# Patient Record
Sex: Female | Born: 1955
Health system: Southern US, Community
[De-identification: ages and names within clinical notes are randomized; demographics above are authoritative.]

## PROBLEM LIST (undated history)

## (undated) DIAGNOSIS — G709 Myoneural disorder, unspecified: Secondary | ICD-10-CM

## (undated) DIAGNOSIS — I10 Essential (primary) hypertension: Secondary | ICD-10-CM

## (undated) DIAGNOSIS — J449 Chronic obstructive pulmonary disease, unspecified: Secondary | ICD-10-CM

## (undated) DIAGNOSIS — K222 Esophageal obstruction: Secondary | ICD-10-CM

## (undated) DIAGNOSIS — F32A Depression, unspecified: Secondary | ICD-10-CM

## (undated) DIAGNOSIS — R011 Cardiac murmur, unspecified: Secondary | ICD-10-CM

## (undated) DIAGNOSIS — G8929 Other chronic pain: Secondary | ICD-10-CM

## (undated) DIAGNOSIS — K920 Hematemesis: Secondary | ICD-10-CM

## (undated) DIAGNOSIS — R112 Nausea with vomiting, unspecified: Secondary | ICD-10-CM

## (undated) DIAGNOSIS — N3010 Interstitial cystitis (chronic) without hematuria: Secondary | ICD-10-CM

## (undated) DIAGNOSIS — T7840XA Allergy, unspecified, initial encounter: Secondary | ICD-10-CM

## (undated) DIAGNOSIS — Z9889 Other specified postprocedural states: Secondary | ICD-10-CM

## (undated) DIAGNOSIS — I2510 Atherosclerotic heart disease of native coronary artery without angina pectoris: Secondary | ICD-10-CM

## (undated) DIAGNOSIS — M549 Dorsalgia, unspecified: Secondary | ICD-10-CM

## (undated) DIAGNOSIS — F329 Major depressive disorder, single episode, unspecified: Secondary | ICD-10-CM

## (undated) DIAGNOSIS — J45909 Unspecified asthma, uncomplicated: Secondary | ICD-10-CM

## (undated) DIAGNOSIS — F419 Anxiety disorder, unspecified: Secondary | ICD-10-CM

## (undated) DIAGNOSIS — K589 Irritable bowel syndrome without diarrhea: Secondary | ICD-10-CM

## (undated) DIAGNOSIS — E785 Hyperlipidemia, unspecified: Secondary | ICD-10-CM

## (undated) DIAGNOSIS — K219 Gastro-esophageal reflux disease without esophagitis: Secondary | ICD-10-CM

## (undated) DIAGNOSIS — M1990 Unspecified osteoarthritis, unspecified site: Secondary | ICD-10-CM

## (undated) HISTORY — DX: Anxiety disorder, unspecified: F41.9

## (undated) HISTORY — DX: Myoneural disorder, unspecified: G70.9

## (undated) HISTORY — DX: Dorsalgia, unspecified: M54.9

## (undated) HISTORY — PX: ABDOMINAL HYSTERECTOMY: SHX81

## (undated) HISTORY — DX: Interstitial cystitis (chronic) without hematuria: N30.10

## (undated) HISTORY — DX: Major depressive disorder, single episode, unspecified: F32.9

## (undated) HISTORY — DX: Depression, unspecified: F32.A

## (undated) HISTORY — DX: Allergy, unspecified, initial encounter: T78.40XA

## (undated) HISTORY — PX: TONSILLECTOMY: SUR1361

## (undated) HISTORY — DX: Esophageal obstruction: K22.2

## (undated) HISTORY — PX: APPENDECTOMY: SHX54

## (undated) HISTORY — DX: Unspecified osteoarthritis, unspecified site: M19.90

## (undated) HISTORY — DX: Hyperlipidemia, unspecified: E78.5

## (undated) HISTORY — DX: Gastro-esophageal reflux disease without esophagitis: K21.9

## (undated) HISTORY — DX: Unspecified asthma, uncomplicated: J45.909

## (undated) HISTORY — DX: Irritable bowel syndrome, unspecified: K58.9

## (undated) HISTORY — DX: Other chronic pain: G89.29

## (undated) HISTORY — DX: Essential (primary) hypertension: I10

## (undated) HISTORY — PX: ESOPHAGOGASTRODUODENOSCOPY: SHX1529

## (undated) HISTORY — PX: CERVICAL FUSION: SHX112

## (undated) HISTORY — DX: Cardiac murmur, unspecified: R01.1

## (undated) HISTORY — PX: FINGER SURGERY: SHX640

## (undated) HISTORY — PX: CERVICAL LAMINECTOMY: SHX94

## (undated) HISTORY — PX: LUMBAR FUSION: SHX111

---
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
## 1997-05-09 ENCOUNTER — Other Ambulatory Visit: Admission: RE | Admit: 1997-05-09 | Discharge: 1997-05-09 | Payer: Self-pay | Admitting: Gynecology

## 1997-09-05 ENCOUNTER — Ambulatory Visit (HOSPITAL_COMMUNITY): Admission: RE | Admit: 1997-09-05 | Discharge: 1997-09-05 | Payer: Self-pay | Admitting: Internal Medicine

## 1998-04-17 ENCOUNTER — Ambulatory Visit (HOSPITAL_COMMUNITY): Admission: RE | Admit: 1998-04-17 | Discharge: 1998-04-17 | Payer: Self-pay | Admitting: Internal Medicine

## 1998-04-17 ENCOUNTER — Encounter: Payer: Self-pay | Admitting: Internal Medicine

## 1998-07-17 ENCOUNTER — Other Ambulatory Visit: Admission: RE | Admit: 1998-07-17 | Discharge: 1998-07-17 | Payer: Self-pay | Admitting: Gynecology

## 1999-05-28 ENCOUNTER — Encounter: Payer: Self-pay | Admitting: Emergency Medicine

## 1999-05-28 ENCOUNTER — Emergency Department (HOSPITAL_COMMUNITY): Admission: EM | Admit: 1999-05-28 | Discharge: 1999-05-28 | Payer: Self-pay | Admitting: Emergency Medicine

## 1999-09-09 ENCOUNTER — Emergency Department (HOSPITAL_COMMUNITY): Admission: EM | Admit: 1999-09-09 | Discharge: 1999-09-09 | Payer: Self-pay | Admitting: Emergency Medicine

## 2000-02-25 ENCOUNTER — Encounter: Payer: Self-pay | Admitting: Internal Medicine

## 2000-02-25 ENCOUNTER — Ambulatory Visit (HOSPITAL_COMMUNITY): Admission: RE | Admit: 2000-02-25 | Discharge: 2000-02-25 | Payer: Self-pay | Admitting: Internal Medicine

## 2001-01-12 ENCOUNTER — Other Ambulatory Visit: Admission: RE | Admit: 2001-01-12 | Discharge: 2001-01-12 | Payer: Self-pay | Admitting: Gynecology

## 2001-02-23 ENCOUNTER — Encounter: Payer: Self-pay | Admitting: Internal Medicine

## 2001-02-23 ENCOUNTER — Inpatient Hospital Stay (HOSPITAL_COMMUNITY): Admission: EM | Admit: 2001-02-23 | Discharge: 2001-02-26 | Payer: Self-pay | Admitting: Emergency Medicine

## 2001-03-16 DIAGNOSIS — K222 Esophageal obstruction: Secondary | ICD-10-CM

## 2001-06-15 ENCOUNTER — Ambulatory Visit (HOSPITAL_BASED_OUTPATIENT_CLINIC_OR_DEPARTMENT_OTHER): Admission: RE | Admit: 2001-06-15 | Discharge: 2001-06-15 | Payer: Self-pay | Admitting: *Deleted

## 2002-01-26 ENCOUNTER — Encounter: Payer: Self-pay | Admitting: Internal Medicine

## 2002-01-26 ENCOUNTER — Ambulatory Visit (HOSPITAL_COMMUNITY): Admission: RE | Admit: 2002-01-26 | Discharge: 2002-01-26 | Payer: Self-pay | Admitting: Internal Medicine

## 2004-02-24 ENCOUNTER — Ambulatory Visit (HOSPITAL_BASED_OUTPATIENT_CLINIC_OR_DEPARTMENT_OTHER): Admission: RE | Admit: 2004-02-24 | Discharge: 2004-02-24 | Payer: Self-pay | Admitting: Urology

## 2004-02-24 ENCOUNTER — Encounter (INDEPENDENT_AMBULATORY_CARE_PROVIDER_SITE_OTHER): Payer: Self-pay | Admitting: Specialist

## 2004-02-24 ENCOUNTER — Ambulatory Visit (HOSPITAL_COMMUNITY): Admission: RE | Admit: 2004-02-24 | Discharge: 2004-02-24 | Payer: Self-pay | Admitting: Urology

## 2004-12-24 ENCOUNTER — Ambulatory Visit: Payer: Self-pay | Admitting: Internal Medicine

## 2005-03-04 ENCOUNTER — Ambulatory Visit: Payer: Self-pay | Admitting: Internal Medicine

## 2005-04-05 ENCOUNTER — Encounter: Admission: RE | Admit: 2005-04-05 | Discharge: 2005-04-05 | Payer: Self-pay | Admitting: Internal Medicine

## 2005-04-09 ENCOUNTER — Ambulatory Visit: Payer: Self-pay | Admitting: Internal Medicine

## 2006-01-21 ENCOUNTER — Ambulatory Visit: Payer: Self-pay | Admitting: Internal Medicine

## 2006-03-20 ENCOUNTER — Ambulatory Visit: Payer: Self-pay | Admitting: Internal Medicine

## 2006-04-14 ENCOUNTER — Emergency Department (HOSPITAL_COMMUNITY): Admission: EM | Admit: 2006-04-14 | Discharge: 2006-04-14 | Payer: Self-pay | Admitting: Emergency Medicine

## 2006-04-21 ENCOUNTER — Ambulatory Visit: Payer: Self-pay

## 2006-05-15 ENCOUNTER — Ambulatory Visit: Payer: Self-pay | Admitting: Internal Medicine

## 2006-06-16 ENCOUNTER — Ambulatory Visit: Payer: Self-pay | Admitting: Internal Medicine

## 2006-08-12 ENCOUNTER — Telehealth: Payer: Self-pay | Admitting: Internal Medicine

## 2006-08-25 ENCOUNTER — Ambulatory Visit: Payer: Self-pay | Admitting: Internal Medicine

## 2006-08-26 ENCOUNTER — Encounter: Payer: Self-pay | Admitting: Internal Medicine

## 2006-08-26 LAB — CONVERTED CEMR LAB
BUN: 12 mg/dL (ref 6–23)
CRP, High Sensitivity: <1 — ABNORMAL LOW (ref 0.00–5.00)
Chloride: 107 meq/L (ref 96–112)
Creatinine, Ser: 0.7 mg/dL (ref 0.4–1.2)
GFR calc Af Amer: 113 mL/min
Glucose, Bld: 98 mg/dL (ref 70–99)
Potassium: 4.5 meq/L (ref 3.5–5.1)
Sodium: 147 meq/L — ABNORMAL HIGH (ref 135–145)

## 2006-09-02 ENCOUNTER — Telehealth (INDEPENDENT_AMBULATORY_CARE_PROVIDER_SITE_OTHER): Payer: Self-pay | Admitting: *Deleted

## 2006-09-09 DIAGNOSIS — K589 Irritable bowel syndrome without diarrhea: Secondary | ICD-10-CM | POA: Insufficient documentation

## 2006-09-15 ENCOUNTER — Ambulatory Visit: Payer: Self-pay | Admitting: Internal Medicine

## 2006-09-15 DIAGNOSIS — IMO0002 Reserved for concepts with insufficient information to code with codable children: Secondary | ICD-10-CM

## 2006-09-15 DIAGNOSIS — F339 Major depressive disorder, recurrent, unspecified: Secondary | ICD-10-CM

## 2006-09-18 ENCOUNTER — Telehealth (INDEPENDENT_AMBULATORY_CARE_PROVIDER_SITE_OTHER): Payer: Self-pay | Admitting: *Deleted

## 2006-09-30 ENCOUNTER — Telehealth: Payer: Self-pay | Admitting: Internal Medicine

## 2006-10-16 ENCOUNTER — Telehealth: Payer: Self-pay | Admitting: *Deleted

## 2006-10-21 ENCOUNTER — Telehealth: Payer: Self-pay | Admitting: Internal Medicine

## 2006-10-27 ENCOUNTER — Ambulatory Visit: Payer: Self-pay | Admitting: Internal Medicine

## 2006-10-27 DIAGNOSIS — N951 Menopausal and female climacteric states: Secondary | ICD-10-CM

## 2006-11-06 ENCOUNTER — Telehealth: Payer: Self-pay | Admitting: *Deleted

## 2006-11-07 ENCOUNTER — Telehealth (INDEPENDENT_AMBULATORY_CARE_PROVIDER_SITE_OTHER): Payer: Self-pay | Admitting: *Deleted

## 2006-11-07 DIAGNOSIS — M1710 Unilateral primary osteoarthritis, unspecified knee: Secondary | ICD-10-CM | POA: Insufficient documentation

## 2006-11-21 ENCOUNTER — Telehealth (INDEPENDENT_AMBULATORY_CARE_PROVIDER_SITE_OTHER): Payer: Self-pay | Admitting: *Deleted

## 2006-12-02 ENCOUNTER — Telehealth: Payer: Self-pay | Admitting: Internal Medicine

## 2006-12-09 ENCOUNTER — Ambulatory Visit: Payer: Self-pay | Admitting: Internal Medicine

## 2006-12-29 ENCOUNTER — Telehealth: Payer: Self-pay | Admitting: *Deleted

## 2007-02-02 ENCOUNTER — Telehealth: Payer: Self-pay | Admitting: Internal Medicine

## 2007-02-26 ENCOUNTER — Ambulatory Visit: Payer: Self-pay | Admitting: Internal Medicine

## 2007-02-26 DIAGNOSIS — J209 Acute bronchitis, unspecified: Secondary | ICD-10-CM

## 2007-03-04 ENCOUNTER — Telehealth: Payer: Self-pay | Admitting: Internal Medicine

## 2007-03-06 ENCOUNTER — Telehealth: Payer: Self-pay | Admitting: Internal Medicine

## 2007-03-13 ENCOUNTER — Telehealth: Payer: Self-pay | Admitting: Internal Medicine

## 2007-03-23 ENCOUNTER — Ambulatory Visit: Payer: Self-pay | Admitting: Internal Medicine

## 2007-03-23 DIAGNOSIS — F17200 Nicotine dependence, unspecified, uncomplicated: Secondary | ICD-10-CM

## 2007-03-30 ENCOUNTER — Telehealth: Payer: Self-pay | Admitting: Internal Medicine

## 2007-04-01 ENCOUNTER — Encounter: Payer: Self-pay | Admitting: Internal Medicine

## 2007-04-09 ENCOUNTER — Telehealth: Payer: Self-pay | Admitting: *Deleted

## 2007-04-14 ENCOUNTER — Emergency Department (HOSPITAL_COMMUNITY): Admission: EM | Admit: 2007-04-14 | Discharge: 2007-04-14 | Payer: Self-pay | Admitting: Family Medicine

## 2007-04-14 ENCOUNTER — Telehealth: Payer: Self-pay | Admitting: Internal Medicine

## 2007-04-20 ENCOUNTER — Ambulatory Visit: Payer: Self-pay | Admitting: Internal Medicine

## 2007-05-06 ENCOUNTER — Encounter: Admission: RE | Admit: 2007-05-06 | Discharge: 2007-05-06 | Payer: Self-pay | Admitting: Internal Medicine

## 2007-05-06 ENCOUNTER — Telehealth: Payer: Self-pay | Admitting: Internal Medicine

## 2007-05-08 ENCOUNTER — Telehealth (INDEPENDENT_AMBULATORY_CARE_PROVIDER_SITE_OTHER): Payer: Self-pay | Admitting: *Deleted

## 2007-05-11 ENCOUNTER — Encounter: Payer: Self-pay | Admitting: Internal Medicine

## 2007-06-05 ENCOUNTER — Encounter: Admission: RE | Admit: 2007-06-05 | Discharge: 2007-06-05 | Payer: Self-pay | Admitting: Sports Medicine

## 2007-06-15 ENCOUNTER — Telehealth: Payer: Self-pay | Admitting: *Deleted

## 2007-06-16 ENCOUNTER — Telehealth (INDEPENDENT_AMBULATORY_CARE_PROVIDER_SITE_OTHER): Payer: Self-pay | Admitting: *Deleted

## 2007-06-23 ENCOUNTER — Encounter: Payer: Self-pay | Admitting: Internal Medicine

## 2007-07-01 ENCOUNTER — Telehealth: Payer: Self-pay | Admitting: Internal Medicine

## 2007-07-02 ENCOUNTER — Ambulatory Visit (HOSPITAL_COMMUNITY): Admission: RE | Admit: 2007-07-02 | Discharge: 2007-07-03 | Payer: Self-pay | Admitting: Neurological Surgery

## 2007-07-17 ENCOUNTER — Encounter: Payer: Self-pay | Admitting: Internal Medicine

## 2007-07-24 ENCOUNTER — Ambulatory Visit (HOSPITAL_BASED_OUTPATIENT_CLINIC_OR_DEPARTMENT_OTHER): Admission: RE | Admit: 2007-07-24 | Discharge: 2007-07-24 | Payer: Self-pay | Admitting: Orthopedic Surgery

## 2007-07-31 ENCOUNTER — Encounter: Payer: Self-pay | Admitting: Internal Medicine

## 2007-08-07 ENCOUNTER — Ambulatory Visit: Payer: Self-pay | Admitting: Internal Medicine

## 2007-08-07 LAB — CONVERTED CEMR LAB: Blood Glucose, Fingerstick: 108

## 2007-08-19 ENCOUNTER — Encounter: Payer: Self-pay | Admitting: Internal Medicine

## 2007-08-20 ENCOUNTER — Encounter: Payer: Self-pay | Admitting: Internal Medicine

## 2007-08-31 ENCOUNTER — Encounter: Payer: Self-pay | Admitting: Internal Medicine

## 2007-08-31 ENCOUNTER — Telehealth: Payer: Self-pay | Admitting: Internal Medicine

## 2007-09-04 ENCOUNTER — Encounter: Payer: Self-pay | Admitting: Internal Medicine

## 2007-09-08 ENCOUNTER — Ambulatory Visit: Payer: Self-pay | Admitting: Internal Medicine

## 2007-09-08 DIAGNOSIS — M5020 Other cervical disc displacement, unspecified cervical region: Secondary | ICD-10-CM

## 2007-09-08 DIAGNOSIS — M545 Low back pain: Secondary | ICD-10-CM

## 2007-09-08 DIAGNOSIS — M7500 Adhesive capsulitis of unspecified shoulder: Secondary | ICD-10-CM | POA: Insufficient documentation

## 2007-09-14 ENCOUNTER — Ambulatory Visit: Payer: Self-pay | Admitting: Internal Medicine

## 2007-09-30 ENCOUNTER — Ambulatory Visit: Payer: Self-pay | Admitting: Internal Medicine

## 2007-10-09 ENCOUNTER — Encounter: Payer: Self-pay | Admitting: Internal Medicine

## 2007-10-13 ENCOUNTER — Ambulatory Visit: Payer: Self-pay | Admitting: Internal Medicine

## 2007-10-29 ENCOUNTER — Telehealth: Payer: Self-pay | Admitting: Internal Medicine

## 2007-11-19 ENCOUNTER — Telehealth: Payer: Self-pay | Admitting: Internal Medicine

## 2007-12-14 ENCOUNTER — Encounter: Payer: Self-pay | Admitting: Internal Medicine

## 2008-01-11 ENCOUNTER — Encounter: Payer: Self-pay | Admitting: Internal Medicine

## 2008-01-18 ENCOUNTER — Telehealth (INDEPENDENT_AMBULATORY_CARE_PROVIDER_SITE_OTHER): Payer: Self-pay | Admitting: *Deleted

## 2008-01-27 ENCOUNTER — Telehealth: Payer: Self-pay | Admitting: Internal Medicine

## 2008-02-03 ENCOUNTER — Ambulatory Visit: Payer: Self-pay | Admitting: Internal Medicine

## 2008-02-22 ENCOUNTER — Encounter: Payer: Self-pay | Admitting: Internal Medicine

## 2008-02-24 ENCOUNTER — Telehealth: Payer: Self-pay | Admitting: Internal Medicine

## 2008-03-24 ENCOUNTER — Telehealth: Payer: Self-pay | Admitting: *Deleted

## 2008-03-25 ENCOUNTER — Ambulatory Visit: Payer: Self-pay | Admitting: Internal Medicine

## 2008-03-25 DIAGNOSIS — M79609 Pain in unspecified limb: Secondary | ICD-10-CM | POA: Insufficient documentation

## 2008-03-29 ENCOUNTER — Telehealth: Payer: Self-pay | Admitting: Internal Medicine

## 2008-05-05 ENCOUNTER — Ambulatory Visit: Payer: Self-pay | Admitting: Internal Medicine

## 2008-06-06 ENCOUNTER — Ambulatory Visit: Payer: Self-pay | Admitting: Internal Medicine

## 2008-06-27 ENCOUNTER — Telehealth (INDEPENDENT_AMBULATORY_CARE_PROVIDER_SITE_OTHER): Payer: Self-pay | Admitting: *Deleted

## 2008-07-04 ENCOUNTER — Ambulatory Visit: Payer: Self-pay | Admitting: Internal Medicine

## 2008-07-04 DIAGNOSIS — M5412 Radiculopathy, cervical region: Secondary | ICD-10-CM | POA: Insufficient documentation

## 2008-08-25 ENCOUNTER — Telehealth: Payer: Self-pay | Admitting: Internal Medicine

## 2008-09-13 ENCOUNTER — Telehealth: Payer: Self-pay | Admitting: Internal Medicine

## 2008-11-15 ENCOUNTER — Ambulatory Visit: Payer: Self-pay | Admitting: Internal Medicine

## 2008-12-27 ENCOUNTER — Telehealth: Payer: Self-pay | Admitting: Internal Medicine

## 2009-01-03 ENCOUNTER — Telehealth (INDEPENDENT_AMBULATORY_CARE_PROVIDER_SITE_OTHER): Payer: Self-pay | Admitting: *Deleted

## 2009-01-27 ENCOUNTER — Telehealth: Payer: Self-pay | Admitting: Internal Medicine

## 2009-02-15 ENCOUNTER — Ambulatory Visit: Payer: Self-pay | Admitting: Internal Medicine

## 2009-02-27 ENCOUNTER — Telehealth: Payer: Self-pay | Admitting: Internal Medicine

## 2009-04-07 ENCOUNTER — Telehealth: Payer: Self-pay | Admitting: Internal Medicine

## 2009-05-09 ENCOUNTER — Telehealth: Payer: Self-pay | Admitting: Internal Medicine

## 2009-05-29 ENCOUNTER — Telehealth: Payer: Self-pay | Admitting: Internal Medicine

## 2009-06-01 ENCOUNTER — Telehealth: Payer: Self-pay | Admitting: Internal Medicine

## 2009-07-06 ENCOUNTER — Telehealth: Payer: Self-pay | Admitting: Internal Medicine

## 2009-07-12 ENCOUNTER — Telehealth: Payer: Self-pay | Admitting: Internal Medicine

## 2009-07-24 ENCOUNTER — Telehealth: Payer: Self-pay | Admitting: Internal Medicine

## 2009-07-31 ENCOUNTER — Telehealth: Payer: Self-pay | Admitting: *Deleted

## 2009-09-07 ENCOUNTER — Telehealth: Payer: Self-pay | Admitting: Internal Medicine

## 2009-09-29 ENCOUNTER — Telehealth: Payer: Self-pay | Admitting: Internal Medicine

## 2009-10-06 ENCOUNTER — Telehealth: Payer: Self-pay | Admitting: Internal Medicine

## 2009-10-12 ENCOUNTER — Ambulatory Visit: Payer: Self-pay | Admitting: Internal Medicine

## 2009-10-12 DIAGNOSIS — M722 Plantar fascial fibromatosis: Secondary | ICD-10-CM

## 2009-10-12 LAB — CONVERTED CEMR LAB: Cyclic Citrullin Peptide Ab: <2 units (ref 0.0–5.0)

## 2009-10-13 ENCOUNTER — Telehealth: Payer: Self-pay | Admitting: Internal Medicine

## 2009-10-25 ENCOUNTER — Ambulatory Visit: Payer: Self-pay | Admitting: Internal Medicine

## 2009-10-25 DIAGNOSIS — R9431 Abnormal electrocardiogram [ECG] [EKG]: Secondary | ICD-10-CM

## 2009-10-25 LAB — CONVERTED CEMR LAB
ALT: 40 units/L — ABNORMAL HIGH (ref 0–35)
Alkaline Phosphatase: 69 units/L (ref 39–117)
BUN: 20 mg/dL (ref 6–23)
Calcium: 9.3 mg/dL (ref 8.4–10.5)
Cholesterol: 233 mg/dL — ABNORMAL HIGH (ref 0–200)
Eosinophils Absolute: 0.1 10*3/uL (ref 0.0–0.7)
HCT: 39.5 % (ref 36.0–46.0)
HDL: 57 mg/dL (ref >39.00)
Hemoglobin: 13.6 g/dL (ref 12.0–15.0)
MCV: 89.7 fL (ref 78.0–100.0)
Monocytes Absolute: 0.7 10*3/uL (ref 0.1–1.0)
Monocytes Relative: 4.4 % (ref 3.0–12.0)
Neutro Abs: 10 10*3/uL — ABNORMAL HIGH (ref 1.4–7.7)
Neutrophils Relative %: 62.9 % (ref 43.0–77.0)
Sodium: 141 meq/L (ref 135–145)
Total Bilirubin: 0.5 mg/dL (ref 0.3–1.2)
Triglycerides: 147 mg/dL (ref 0.0–149.0)

## 2009-10-26 ENCOUNTER — Telehealth: Payer: Self-pay | Admitting: Internal Medicine

## 2009-10-26 ENCOUNTER — Emergency Department (HOSPITAL_COMMUNITY): Admission: EM | Admit: 2009-10-26 | Discharge: 2009-10-26 | Payer: Self-pay | Admitting: Emergency Medicine

## 2009-10-30 ENCOUNTER — Encounter: Payer: Self-pay | Admitting: Internal Medicine

## 2009-10-30 ENCOUNTER — Encounter (HOSPITAL_COMMUNITY): Admission: RE | Admit: 2009-10-30 | Discharge: 2010-01-11 | Payer: Self-pay | Admitting: Internal Medicine

## 2009-10-30 ENCOUNTER — Ambulatory Visit: Payer: Self-pay

## 2009-10-30 ENCOUNTER — Ambulatory Visit: Payer: Self-pay | Admitting: Internal Medicine

## 2009-10-31 ENCOUNTER — Ambulatory Visit: Payer: Self-pay | Admitting: Internal Medicine

## 2009-10-31 ENCOUNTER — Telehealth: Payer: Self-pay | Admitting: Internal Medicine

## 2009-10-31 DIAGNOSIS — I10 Essential (primary) hypertension: Secondary | ICD-10-CM | POA: Insufficient documentation

## 2009-10-31 LAB — CONVERTED CEMR LAB
Basophils Absolute: 0 10*3/uL (ref 0.0–0.1)
Basophils Relative: 0.3 % (ref 0.0–3.0)
Eosinophils Absolute: 0.3 10*3/uL (ref 0.0–0.7)
Eosinophils Relative: 2.5 % (ref 0.0–5.0)
Hemoglobin: 13.6 g/dL (ref 12.0–15.0)
Lymphocytes Relative: 20.9 % (ref 12.0–46.0)
Lymphs Abs: 3 10*3/uL (ref 0.7–4.0)
MCV: 89.9 fL (ref 78.0–100.0)
Monocytes Absolute: 0.7 10*3/uL (ref 0.1–1.0)
Neutro Abs: 10.1 10*3/uL — ABNORMAL HIGH (ref 1.4–7.7)
Platelets: 297 10*3/uL (ref 150.0–400.0)
RBC: 4.37 M/uL (ref 3.87–5.11)
RDW: 13.6 % (ref 11.5–14.6)

## 2009-11-01 ENCOUNTER — Telehealth: Payer: Self-pay | Admitting: Internal Medicine

## 2009-11-02 ENCOUNTER — Ambulatory Visit: Payer: Self-pay | Admitting: Internal Medicine

## 2009-11-02 ENCOUNTER — Telehealth: Payer: Self-pay | Admitting: Internal Medicine

## 2009-11-02 ENCOUNTER — Other Ambulatory Visit: Admission: RE | Admit: 2009-11-02 | Discharge: 2009-11-02 | Payer: Self-pay | Admitting: Internal Medicine

## 2009-11-03 ENCOUNTER — Telehealth: Payer: Self-pay | Admitting: Internal Medicine

## 2009-11-10 ENCOUNTER — Telehealth: Payer: Self-pay | Admitting: Internal Medicine

## 2009-11-15 ENCOUNTER — Telehealth: Payer: Self-pay | Admitting: Internal Medicine

## 2009-11-21 ENCOUNTER — Ambulatory Visit: Payer: Self-pay | Admitting: Cardiovascular Disease

## 2009-11-21 DIAGNOSIS — R002 Palpitations: Secondary | ICD-10-CM | POA: Insufficient documentation

## 2009-11-21 DIAGNOSIS — R9439 Abnormal result of other cardiovascular function study: Secondary | ICD-10-CM

## 2009-11-21 DIAGNOSIS — R072 Precordial pain: Secondary | ICD-10-CM | POA: Insufficient documentation

## 2009-11-21 LAB — CONVERTED CEMR LAB
BUN: 16 mg/dL (ref 6–23)
CO2: 28 meq/L (ref 19–32)
Calcium: 9.3 mg/dL (ref 8.4–10.5)
Chloride: 102 meq/L (ref 96–112)
Eosinophils Relative: 2.3 % (ref 0.0–5.0)
INR: 1 (ref 0.8–1.0)
Lymphocytes Relative: 20.8 % (ref 12.0–46.0)
MCHC: 34.1 g/dL (ref 30.0–36.0)
Platelets: 267 10*3/uL (ref 150.0–400.0)
Sodium: 138 meq/L (ref 135–145)
WBC: 10.4 10*3/uL (ref 4.5–10.5)

## 2009-11-23 ENCOUNTER — Telehealth (INDEPENDENT_AMBULATORY_CARE_PROVIDER_SITE_OTHER): Payer: Self-pay | Admitting: *Deleted

## 2009-11-29 ENCOUNTER — Telehealth: Payer: Self-pay | Admitting: Internal Medicine

## 2009-11-30 ENCOUNTER — Ambulatory Visit: Payer: Self-pay | Admitting: Cardiovascular Disease

## 2009-11-30 ENCOUNTER — Inpatient Hospital Stay (HOSPITAL_BASED_OUTPATIENT_CLINIC_OR_DEPARTMENT_OTHER): Admission: RE | Admit: 2009-11-30 | Discharge: 2009-11-30 | Payer: Self-pay | Admitting: Cardiovascular Disease

## 2009-12-04 ENCOUNTER — Ambulatory Visit (HOSPITAL_COMMUNITY): Admission: RE | Admit: 2009-12-04 | Discharge: 2009-12-04 | Payer: Self-pay | Admitting: Cardiovascular Disease

## 2009-12-04 ENCOUNTER — Ambulatory Visit: Payer: Self-pay | Admitting: Cardiovascular Disease

## 2009-12-04 ENCOUNTER — Encounter: Payer: Self-pay | Admitting: Cardiovascular Disease

## 2009-12-04 ENCOUNTER — Ambulatory Visit: Payer: Self-pay

## 2009-12-04 ENCOUNTER — Ambulatory Visit: Payer: Self-pay | Admitting: Cardiology

## 2009-12-12 ENCOUNTER — Encounter: Payer: Self-pay | Admitting: Cardiovascular Disease

## 2009-12-12 ENCOUNTER — Telehealth: Payer: Self-pay | Admitting: Cardiovascular Disease

## 2009-12-19 ENCOUNTER — Ambulatory Visit: Payer: Self-pay | Admitting: Cardiovascular Disease

## 2009-12-19 DIAGNOSIS — I2510 Atherosclerotic heart disease of native coronary artery without angina pectoris: Secondary | ICD-10-CM

## 2010-01-01 ENCOUNTER — Telehealth: Payer: Self-pay | Admitting: Internal Medicine

## 2010-01-01 ENCOUNTER — Ambulatory Visit: Payer: Self-pay | Admitting: Family Medicine

## 2010-01-01 DIAGNOSIS — N3010 Interstitial cystitis (chronic) without hematuria: Secondary | ICD-10-CM

## 2010-01-01 DIAGNOSIS — R221 Localized swelling, mass and lump, neck: Secondary | ICD-10-CM

## 2010-01-01 DIAGNOSIS — R220 Localized swelling, mass and lump, head: Secondary | ICD-10-CM

## 2010-01-01 LAB — CONVERTED CEMR LAB
Blood in Urine, dipstick: NEGATIVE
Nitrite: NEGATIVE
Protein, U semiquant: NEGATIVE
Specific Gravity, Urine: 1.025
Urobilinogen, UA: 0.2
WBC Urine, dipstick: NEGATIVE
pH: 5

## 2010-01-19 ENCOUNTER — Ambulatory Visit: Payer: Self-pay | Admitting: Internal Medicine

## 2010-02-07 ENCOUNTER — Telehealth: Payer: Self-pay | Admitting: Internal Medicine

## 2010-02-27 ENCOUNTER — Telehealth: Payer: Self-pay | Admitting: Internal Medicine

## 2010-03-04 ENCOUNTER — Encounter: Payer: Self-pay | Admitting: Internal Medicine

## 2010-03-13 ENCOUNTER — Telehealth: Payer: Self-pay | Admitting: Internal Medicine

## 2010-03-15 ENCOUNTER — Telehealth: Payer: Self-pay | Admitting: Internal Medicine

## 2010-03-15 DIAGNOSIS — M549 Dorsalgia, unspecified: Secondary | ICD-10-CM

## 2010-03-15 NOTE — Assessment & Plan Note (Signed)
Summary: 3-4 WK/[name]  Medications Added XANAX 0.5 MG  TABS (ALPRAZOLAM) Take one tablet  by mouth as needed ASPIRIN 81 MG TBEC (ASPIRIN) Take one tablet by mouth daily. pt HAS NOT [name] METOPROLOL TARTRATE 25 MG TABS (METOPROLOL TARTRATE) Take one tablet by mouth twice a day .Laurie [name] has not [name] PRAVASTATIN SODIUM 40 MG TABS (PRAVASTATIN SODIUM) Take one tablet by mouth daily at bedtime .[place] [name] has not [name]        Visit Type:  rov Primary Provider:  Stacie Glaze MD  CC:  denies any cardiac complaints today...pt c/o fatigue.  History of Present Illness: 55 yo WF with h/o tobacco abuse, borderline hyperlipidemia and family history of CAD who is here today for follow up. She was seen as a new pt one month ago for evaluation of chest pain. She was recently seen by Dr. Lovell Sheehan and was sent for a stress myoview. Her perfusion images were normal with normal LVEF but she did have ST segment depression in recovery and a hypertensive response to exercise.  She was sent to the ED on September 15th, 2011 with episode of chest pain that she felt was related to something stuck in her esophagus. This pain was substernal squeezing. EKG and labs ok. She was not admitted. This pain resolved after 6-8 hours. No chest pain since then. She has a history of rheumatic fever as a child. She had noticed almost daily episodes of palpitations with rapid heart rate. She felt dizzy and weak. This lasted for ten minutes. No syncope. She notices frequent "heavy" heart beats associated with dizziness.   I ordered a cath  and a 48 hour Holter monitor.  Her Holter showed PACs, PVCs. Her cath is outlined below, showed mild to moderate CAD. Echo with normal LV size and function. She has had one other episode of chest pain. She has had prior esophageal dilatation. She thinks the pain is similar to prior episodes related to her esophagus. No near syncope or syncope.    Current Medications  (verified): 1)  Xanax 0.5 Mg  Tabs (Alprazolam) .... Take One Tablet  By Mouth As Needed 2)  Valium 5 Mg  Tabs (Diazepam) .... One By Mouth Three Times A Day As Needed 3)  Hydrocodone-Acetaminophen 7.5-500 Mg Tabs (Hydrocodone-Acetaminophen) .Marland Kitchen.. 1 Two Times A Day As Needed- 60 Must Last 30 Days 4)  Pristiq 50 Mg Xr24h-Tab (Desvenlafaxine Succinate) .Marland Kitchen.. 1 Tab Once Daily 5)  Aspirin 81 Mg Tbec (Aspirin) .... Take One Tablet By Mouth Daily. Pt Has Not [name] 6)  Metoprolol Tartrate 25 Mg Tabs (Metoprolol Tartrate) .... Take One Tablet By Mouth Twice A Day .Laurie [name] Has Not [name] 7)  Pravastatin Sodium 40 Mg Tabs (Pravastatin Sodium) .... Take One Tablet By Mouth Daily At Bedtime .Laurie [name] Has Not [name]  Allergies: 1)  ! Sulfa 2)  ! Codeine  Past History:  Past Medical History: Reviewed history from 11/21/2009 and no changes required. IBS (ICD-564.1) STRICTURE, ESOPHAGEAL (ICD-530.3) s/p dilatation x 3. chronic back pain Depression Low back pain NECK PAIN  Social History: Reviewed history from 11/21/2009 and no changes required. Single 1 child Smoked 1/2 ppd for the last 10 years.  Alcohol use-yes, occasional No illicit drug use Works as a Interior and spatial designer at [employer].   Review of Systems  The patient denies fatigue, malaise, fever, weight gain/loss, vision loss, decreased hearing, hoarseness, chest pain, palpitations, shortness of breath, prolonged cough, wheezing, sleep apnea, coughing up blood, abdominal pain, blood in stool,  nausea, vomiting, diarrhea, heartburn, incontinence, blood in urine, muscle weakness, joint pain, leg swelling, rash, skin lesions, headache, fainting, dizziness, depression, anxiety, enlarged lymph nodes, easy bruising or bleeding, and environmental allergies.    Vital Signs:  Patient profile:   55 year old female Height:      71 inches Weight:      172.8 pounds BMI:     24.19 Pulse rate:   76 / minute Pulse rhythm:   irregular BP  sitting:   130 / 76  (left arm) Cuff size:   large  Vitals Entered By: Laurie [name], CMA (December 19, 2009 3:13 PM)  Physical Exam  General:  General: Well developed, well nourished, NAD HEENT: OP clear, mucus membranes moist Psychiatric: Mood and affect normal Neck: No JVD, no carotid bruits, no thyromegaly, no lymphadenopathy. Lungs:Clear bilaterally, no wheezes, rhonci, crackles CV: RRR no murmurs, gallops rubs Abdomen: soft, NT, ND, BS present Extremities: No edema, pulses 2+.    Cardiac Cath  Procedure date:  11/30/2009  Findings:      1. Left main coronary artery had no obstructive disease. 2. The left anterior descending was a large vessel that coursed to the     apex and gave off several diagonal branches.  There was a 20% mild     stenosis in the mid LAD. 3. Circumflex artery was composed of a moderate-sized proximal AV     groove vessel and then it gave off a moderate-sized obtuse marginal     branch.  The AV groove circumflex beyond the marginal branch was     very small in caliber.  At the very beginning portion of the first     obtuse marginal branch, there appeared to be a 40% stenosis.  This     did not appear to be flow-limiting. 4. The right coronary artery was a large dominant vessel that had     discrete 30% stenosis in the mid vessel. 5. Left ventricular angiogram was performed in the RAO projection that     showed normal left ventricular systolic function with ejection     fraction of 55%.  Echocardiogram  Procedure date:  12/04/2009  Findings:      Left ventricle: The cavity size was normal. There was mild focal       basal hypertrophy of the septum. Systolic function was normal. The       estimated ejection fraction was in the range of 60% to 65%. Wall       motion was normal; there were no regional wall motion       abnormalities. Features are consistent with a pseudonormal left       ventricular filling pattern, with concomitant abnormal  relaxation       and increased filling pressure (grade 2 diastolic dysfunction).     - Aortic valve: Trivial regurgitation.  Holter Monitor  Procedure date:  12/04/2009  Findings:      Sinus rhythm, PACs, PVCs. Episode of sinus bradycardia, (slowest 45 bpm x 24 beats).   Impression & Recommendations:  Problem # 1:  CAD, NATIVE VESSEL (ICD-414.01) Mild disease. I have encouraged her to pick up and start her pravastatin, metoprolol and ASA. She will pick up today.  Smoking cessation encouraged.  Her updated medication list for this problem includes:    Aspirin 81 Mg Tbec (Aspirin) .Marland Kitchen... Take one tablet by mouth daily. pt has not [name]    Metoprolol Tartrate 25 Mg Tabs (Metoprolol tartrate) .Marland Kitchen... Take one  tablet by mouth twice a day .Laurie [name]  Problem # 2:  PALPITATIONS (ICD-785.1) Likely secondary to PVCs, PACs. Will start metoprolol.  Her updated medication list for this problem includes:    Aspirin 81 Mg Tbec (Aspirin) .Marland Kitchen... Take one tablet by mouth daily. pt has not [name]    Metoprolol Tartrate 25 Mg Tabs (Metoprolol tartrate) .Marland Kitchen... Take one tablet by mouth twice a day .Laurie [name]  Her updated medication list for this problem includes:    Aspirin 81 Mg Tbec (Aspirin) .Marland Kitchen... Take one tablet by mouth daily. pt has not [name]    Metoprolol Tartrate 25 Mg Tabs (Metoprolol tartrate) .Marland Kitchen... Take one tablet by mouth twice a day .Laurie [name]  Problem # 3:  TOBACCO USE (ICD-305.1) Smoking cessation encouraged.   Patient Instructions: 1)  Your physician recommends that you schedule a follow-up appointment in: 6 months. 2)  Your physician recommends that you continue on your current medications as directed. Please refer to the Current Medication list given to you today.

## 2010-03-15 NOTE — Progress Notes (Signed)
Summary: REQ FOR LAB RESULTS  Phone Note Call from Patient   Caller: Patient    [phone] Reason for Call: Acute Illness, Referral Complaint: Urinary/GYN Problems Summary of Call: Pt called to obtain results of recent labwork.... Pt can be reached at  [phone].  Initial call taken by: Debbra Riding,  November 01, 2009 2:16 PM  Follow-up for Phone Call        .told person anwering phone to return call Follow-up by: Willy Eddy, LPN,  November 01, 2009 4:30 PM  Additional Follow-up for Phone Call Additional follow up Details #1::        pt called again. Additional Follow-up by: Warnell Forester,  November 02, 2009 9:16 AM    Additional Follow-up for Phone Call Additional follow up Details #2::    still elevated white count- dr Lovell Sheehan wants a flow cytometry- pt informed and will come in this pm for lab work Follow-up by: Willy Eddy, LPN,  November 02, 2009 9:29 AM

## 2010-03-15 NOTE — Assessment & Plan Note (Signed)
Summary: Cardiology Nuclear Testing  Nuclear Med Background Indications for Stress Test: Evaluation for Ischemia, Post Hospital, Abnormal EKG  Indications Comments: ER  10/26/09 with CP  History: Echo, Heart Catheterization, Myocardial Perfusion Study  History Comments: '08 ZOX:WRUEAV  Symptoms: Chest Pain, Diaphoresis, Dizziness, DOE, Nausea, Near Syncope, Palpitations, Rapid HR  Symptoms Comments: Last episode of WU:JWJX since d/c   Nuclear Pre-Procedure Cardiac Risk Factors: Family History - CAD, Smoker Caffeine/Decaff Intake: none NPO After: 12:00 AM Lungs: Clear IV 0.9% NS with Angio Cath: 22g     IV Site: R Wrist IV Started by: Cathlyn Parsons, RN Chest Size (in) 38     Cup Size C     Height (in): 71 Weight (lb): 171 BMI: 23.94  Nuclear Med Study 1 or 2 day study:  1 day     Stress Test Type:  Stress Reading MD:  Dietrich Pates, MD     Referring MD:  Darryll Capers, MD Resting Radionuclide:  Technetium 74m Tetrofosmin     Resting Radionuclide Dose:  11 mCi  Stress Radionuclide:  Technetium 6m Tetrofosmin     Stress Radionuclide Dose:  33 mCi   Stress Protocol Exercise Time (min):  7:30 min     Max HR:  150 bpm     Predicted Max HR:  166 bpm  Max Systolic BP: 227 mm Hg     Percent Max HR:  90.36 %     METS: 9.3 Rate Pressure Product:  91478    Stress Test Technologist:  Rea [employer], CMA-N     Nuclear Technologist:  Domenic Polite, CNMT  Rest Procedure  Myocardial perfusion imaging was performed at rest 45 minutes following the intravenous administration of Technetium 27m Tetrofosmin.  Stress Procedure  The patient exercised for 7:30.  The patient stopped due to fatigue and denied any chest pain.  There were no diagnostic ST-T wave changes with exercise, but there were more significant changes in recovery.  There were occasional PAC's.  She did have a hypertensive response to exercise, 227/64.  Technetium 74m Tetrofosmin was injected at peak exercise and myocardial  perfusion imaging was performed after a brief delay.  QPS Raw Data Images:  Stress images were motion corrected. Soft tissue (diaplhragm) underlies heart. Stress Images:  Normal homogeneous uptake in all areas of the myocardium. Rest Images:  Normal homogeneous uptake in all areas of the myocardium. Subtraction (SDS):  No evidence of ischemia. Transient Ischemic Dilatation:  0.82  (Normal <1.22)  Lung/Heart Ratio:  0.26  (Normal <0.45)  Quantitative Gated Spect Images QGS EDV:  73 ml QGS ESV:  17 ml QGS EF:  77 %   Overall Impression  Exercise Capacity: Good exercise capacity. BP Response: Hypertensive blood pressure response. Clinical Symptoms: No chest pain ECG Impression: 1 mm ST depression in V2, V3, V4 at 6 min recovery; less than 1 mm inferiorly.  Returning to baseline at 9 min recovery. Overall Impression: Normal stress nuclear study. Overall Impression Comments: Clinically neg, electrically positive for ischemia.  Hypertensive response.   Myoview with normal perfusion.  LVEF 77%

## 2010-03-15 NOTE — Progress Notes (Signed)
Summary: REQ FOR REFILL (Vicodin)  Phone Note Call from Patient   Caller: Patient   [phone] Reason for Call: Refill Medication Summary of Call: INFO ONLY---Pt called to ck on status of refill req for med: Vicodin ...... Pt adv that Rx req had [facility].... Pt was adv that if req was faxed and received, it would be sent to RN / Physician and same would be taken care of.  Initial call taken by: Debbra Riding,  May 09, 2009 9:41 AM    Prescriptions: HYDROCODONE-ACETAMINOPHEN 7.5-500 MG TABS (HYDROCODONE-ACETAMINOPHEN) one by mouth q 6 hour as needed pain  #60 x 0   Entered by:   Willy Eddy, LPN   Authorized by:   Stacie Glaze MD   Signed by:   Willy Eddy, LPN on 09/81/1914   Method used:   Telephoned to ...       [facility] [address] [phone][facility] (retail)       [address]       [place], [place]  [address]       Ph: [phone] or [phone]       Fax: [phone]   RxID:   [id number]   Appended Document: REQ FOR REFILL (Vicodin) faxed refill request on 04-10-09

## 2010-03-15 NOTE — Progress Notes (Signed)
Summary: pain  Phone Note Call from Patient Call back at [phone]   Caller: vm Summary of Call: Excruciating pain feet last 2 weeks.  Have hardley been able to walk.  Can Dr. Shela Commons give me cortsone shots in my feet.?  Initial call taken by: Rudy Jew, RN,  October 06, 2009 11:51 AM  Follow-up for Phone Call        may give her an appointment for thursday at 11:30 Follow-up by: Willy Eddy, LPN,  October 06, 2009 3:26 PM  Additional Follow-up for Phone Call Additional follow up Details #1::        Called and lft vm for pt to call back.  Additional Follow-up by: Lucy Antigua,  October 06, 2009 3:52 PM    Additional Follow-up for Phone Call Additional follow up Details #2::    Pt called back and has been sch for Thurs 10/12/09, as noted above.    Follow-up by: Lucy Antigua,  October 06, 2009 4:07 PM   Appended Document: Orders Update     Clinical Lists Changes  Orders: Added new Test order of T-Foot Right (73630TC) - Signed Added new Test order of T-Foot Left Min 3 Views [phone]) - Signed

## 2010-03-15 NOTE — Progress Notes (Signed)
Summary: antibiotic & muscle relaxant  Phone Note Call from Patient Call back at [phone]   Caller: vm Call For: [name] Summary of Call: Requesting antibiotic for coughing up of brown mucus, fever, chest hurting, aching all over + he forgot to send muscle relaxant when there. Initial call taken by: Rudy Jew, RN,  February 27, 2009 10:23 AM  Follow-up for Phone Call        pt's aunt given message- flexeril was sent in and pt can try mucinex dm otc . Follow-up by: Willy Eddy, LPN,  February 27, 2009 10:31 AM

## 2010-03-15 NOTE — Progress Notes (Signed)
Summary: wants antibiotic injection  Phone Note From Other Clinic Call back at [facility] Phone [phone]   Caller: Patient Call For: Stacie Glaze MD Summary of Call: Pt cannot take the Doxycycline.......she is having chills, vomiting and abdominal cramping.  Wants an antibiotic injection. [id number]  Initial call taken by: Lynann Beaver CMA,  November 10, 2009 3:21 PM Reason for Call: Need Referral Information    New/Updated Medications: AMOXICILLIN 500 MG CAPS (AMOXICILLIN) 1 three times a day for 10  days Prescriptions: AMOXICILLIN 500 MG CAPS (AMOXICILLIN) 1 three times a day for 10  days  #30 x 0   Entered by:   Willy Eddy, LPN   Authorized by:   Stacie Glaze MD   Signed by:   Willy Eddy, LPN on 47/82/9562   Method used:   Electronically to        [facility]  [phone]* (retail)       [address] [place], [place]  [address]       Ph: [phone] or [phone]       Fax: [phone]   RxID:   [id number]   Appended Document: wants antibiotic injection per dr Lovell Sheehan- change to amoxicilline 500 three times a day for 10 days

## 2010-03-15 NOTE — Progress Notes (Signed)
Summary: RX Refill and Sample Request  Phone Note Call from Patient Call back at Home Phone [phone]   Summary of Call: Pt requesting refill of hydrocondone and also needs samples of prestique, pharmacy [facility] Initial call taken by: Trixie Dredge,  September 07, 2009 12:17 PM    Prescriptions: HYDROCODONE-ACETAMINOPHEN 7.5-500 MG TABS (HYDROCODONE-ACETAMINOPHEN) 1 two times a day as needed- 60 must last 30 days  #60 x 0   Entered by:   Willy Eddy, LPN   Authorized by:   Stacie Glaze MD   Signed by:   Willy Eddy, LPN on 96/29/5284   Method used:   Telephoned to ...       [facility]  [phone]* (retail)       [address] [place], [place]  [address]       Ph: [phone] or [phone]       Fax: [phone]   RxID:   [id number]

## 2010-03-15 NOTE — Progress Notes (Signed)
Summary: 48 holter monitor  Phone Note Outgoing Call Call back at [facility] Phone [phone]   Call placed by: Stanton Kidney, EMT-P,  November 23, 2009 5:47 PM Call placed to: Patient Action Taken: Phone Call Completed, Appt scheduled Summary of Call: 48 hour holter monitor scheduled same day as echo on 12/04/09. Stanton Kidney, EMT-P  November 23, 2009 5:47 PM

## 2010-03-15 NOTE — Progress Notes (Signed)
Summary: ER ASAP  Phone Note Call from Patient   Caller: Patient Call For: Stacie Glaze MD Summary of Call: Pt calls complaining of chest pain that feels "like something is stuck in her esophagus".......Marland KitchenAdvised to go to [facility].  She has a referral in process for Stress Myoview, and impressed upon pt, this is very important to rule out MI.  Could be esophageal, but not SAFE to stay home and wait on symptom relief.  Agrees to have someone take her to the ER now. Initial call taken by: Lynann Beaver CMA,  October 26, 2009 3:40 PM  Follow-up for Phone Call        dr Lovell Sheehan is aware Follow-up by: Willy Eddy, LPN,  October 27, 2009 8:50 AM

## 2010-03-15 NOTE — Cardiovascular Report (Signed)
Summary: Pre Cath Orders   Pre Cath Orders   Imported By: Roderic Ovens 12/08/2009 13:16:08  _____________________________________________________________________  External Attachment:    Type:   Image     Comment:   External Document

## 2010-03-15 NOTE — Progress Notes (Signed)
Summary: antibiotic  Phone Note Call from Patient   Caller: Patient Call For: Stacie Glaze MD Reason for Call: Acute Illness Complaint: Cough/Sore throat, Urinary/GYN Problems Action Taken: Provider Notified Summary of Call: Pt wants interstitial cystitis antibiotic with a sinus infection today.  Cannot come to the office today.  [phone] Initial call taken by: Lynann Beaver CMA AAMA,  February 27, 2010 9:31 AM  Follow-up for Phone Call        per dr Lovell Sheehan- may have septra ds  two times a day for 10 days Follow-up by: Willy Eddy, LPN,  February 27, 2010 10:51 AM    New/Updated Medications: SEPTRA DS 800-160 MG TABS (SULFAMETHOXAZOLE-TRIMETHOPRIM) one by mouth two times a day x 10 days Prescriptions: SEPTRA DS 800-160 MG TABS (SULFAMETHOXAZOLE-TRIMETHOPRIM) one by mouth two times a day x 10 days  #20 x 0   Entered by:   Lynann Beaver CMA AAMA   Authorized by:   Stacie Glaze MD   Signed by:   Lynann Beaver CMA AAMA on 02/27/2010   Method used:   Electronically to        [facility]  [phone]* (retail)       [address] [place], [place]  [address]       Ph: [phone] or [phone]       Fax: [phone]   RxID:   [id number]  Notified pt.

## 2010-03-15 NOTE — Progress Notes (Signed)
Summary: REFILL REQUEST  Phone Note Refill Request Message from:  Patient on November 15, 2009 4:12 PM  Refills Requested: Medication #1:  VALIUM 5 MG  TABS one by mouth three times a day as needed   Notes: [facility]    Initial call taken by: Debbra Riding,  November 15, 2009 4:12 PM    Prescriptions: VALIUM 5 MG  TABS (DIAZEPAM) one by mouth three times a day as needed  #30 x 2   Entered by:   Willy Eddy, LPN   Authorized by:   Stacie Glaze MD   Signed by:   Willy Eddy, LPN on 16/11/9602   Method used:   Telephoned to ...       [facility]  [phone]* (retail)       [address] [place], [place]  [address]       Ph: [phone] or [phone]       Fax: [phone]   RxID:   [id number]

## 2010-03-15 NOTE — Progress Notes (Signed)
Summary: REQ FOR SAMPLES (PRISTIQ 50MG )  Phone Note Call from Patient   Caller: Patient  [phone] Reason for Call: Talk to Nurse, Talk to Doctor Summary of Call: Pt called to adv that she is out of med:   Pristiq 50 Mg..... Pt adv that she would like to have samples prepared and left up front for her to p/u.  Pt adv that if no samples are available, a Rx for med can be [facility] to [facility].  Pt can be reached at [phone] with any questions or concerns / to advise when samples are ready for p/u.  Initial call taken by: Debbra Riding,  May 29, 2009 1:03 PM  Follow-up for Phone Call        5 boxes of 7 -50 mg given-unable to contact pt Follow-up by: Willy Eddy, LPN,  May 29, 2009 3:55 PM

## 2010-03-15 NOTE — Assessment & Plan Note (Signed)
Summary: Excruciating pain in feet/per Laurie [name]/cjr   Vital Signs:  Patient profile:   55 year old female Height:      71 inches Weight:      178 pounds BMI:     24.92 Temp:     98.2 degrees F oral Pulse rate:   72 / minute Resp:     14 per minute BP sitting:   140 / 80  (left arm)  Vitals Entered By: Willy Eddy, LPN (October 12, 2009 12:00 PM) CC: c/o bilateral foot pain  Is Patient Diabetic? No   CC:  c/o bilateral foot pain .  History of Present Illness: hx of inflamatory arthritis possible rheumatoid foot pain ( faciatis) hand and back pain no swelling continues to smoke and experinece passible smoke exposure   Preventive Screening-Counseling & Management  Alcohol-Tobacco     Smoking Status: current     Smoking Cessation Counseling: yes     Passive Smoke Exposure: yes     Tobacco Counseling: to quit use of tobacco products  Problems Prior to Update: 1)  Lumbar Radiculopathy, Right  (ICD-724.4) 2)  Cervical Radiculopathy  (ICD-723.4) 3)  Paroxysmal Nocturnal Dyspnea  (ICD-786.09) 4)  Foot Pain, Right  (ICD-729.5) 5)  Low Back Pain  (ICD-724.2) 6)  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) 7)  Degenerative Disc Disease, Cervical Spine, W/radiculopathy  (ICD-722.0) 8)  Tobacco Use  (ICD-305.1) 9)  Bronchitis, Acute With Mild Bronchospasm  (ICD-466.0) 10)  Osteoarthrosis, Local Nos, Lower Leg  (ICD-715.36) 11)  State, Symptomatic Menopause/fem Climacteric  (ICD-627.2) 12)  Depression  (ICD-311) 13)  Lumbar Radiculopathy, Left  (ICD-724.4) 14)  Ibs  (ICD-564.1) 15)  Stricture, Esophageal  (ICD-530.3)  Current Problems (verified): 1)  Lumbar Radiculopathy, Right  (ICD-724.4) 2)  Cervical Radiculopathy  (ICD-723.4) 3)  Paroxysmal Nocturnal Dyspnea  (ICD-786.09) 4)  Foot Pain, Right  (ICD-729.5) 5)  Low Back Pain  (ICD-724.2) 6)  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) 7)  Degenerative Disc Disease, Cervical Spine, W/radiculopathy  (ICD-722.0) 8)  Tobacco Use   (ICD-305.1) 9)  Bronchitis, Acute With Mild Bronchospasm  (ICD-466.0) 10)  Osteoarthrosis, Local Nos, Lower Leg  (ICD-715.36) 11)  State, Symptomatic Menopause/fem Climacteric  (ICD-627.2) 12)  Depression  (ICD-311) 13)  Lumbar Radiculopathy, Left  (ICD-724.4) 14)  Ibs  (ICD-564.1) 15)  Stricture, Esophageal  (ICD-530.3)  Medications Prior to Update: 1)  Xanax 0.5 Mg  Tabs (Alprazolam) .... Take One Tablet  By Mouth As Needed 2)  Valium 5 Mg  Tabs (Diazepam) .... One By Mouth Three Times A Day As Needed 3)  Allegra-D 24 Hour 180-240 Mg  Tb24 (Fexofenadine-Pseudoephedrine) .... One By Mouth Daily As Needed 4)  Estradiol 1 Mg Tabs (Estradiol) .... One By Mouth Daily 5)  Proair Hfa 108 (90 Base) Mcg/act  Aers (Albuterol Sulfate) .... 2 Puffs Every 6 Hours As Needed 6)  Advair Diskus 250-50 Mcg/dose  Misc (Fluticasone-Salmeterol) .... One Puf By Mouth Two Times A Day As Needed 7)  Ultram 50 Mg  Tabs (Tramadol Hcl) .Marland Kitchen.. 1-2 By Mouth Q 4 Hours As Needed Pain 8)  Hydrocodone-Acetaminophen 7.5-500 Mg Tabs (Hydrocodone-Acetaminophen) .Marland Kitchen.. 1 Two Times A Day As Needed- 60 Must Last 30 Days 9)  Pristiq 50 Mg Xr24h-Tab (Desvenlafaxine Succinate) .... Two  By Mouth Daily 10)  Meloxicam 15 Mg Tabs (Meloxicam) .... One By Mouth Daily 11)  Methylprednisolone 4 Mg Tabs (Methylprednisolone) .... 4 By Mouth For 4 Days The 3 By Mouth For 4 Days The 2 By  Mouth For 4 Days The 1 By Mouth For 4 Days 12)  Cyclobenzaprine Hcl 10 Mg Tabs (Cyclobenzaprine Hcl) .... One By Mouth Three Times A Day  Current Medications (verified): 1)  Xanax 0.5 Mg  Tabs (Alprazolam) .... Take One Tablet  By Mouth As Needed 2)  Valium 5 Mg  Tabs (Diazepam) .... One By Mouth Three Times A Day As Needed 3)  Allegra-D 24 Hour 180-240 Mg  Tb24 (Fexofenadine-Pseudoephedrine) .... One By Mouth Daily As Needed 4)  Estradiol 1 Mg Tabs (Estradiol) .... One By Mouth Daily 5)  Proair Hfa 108 (90 Base) Mcg/act  Aers (Albuterol Sulfate) .... 2 Puffs  Every 6 Hours As Needed 6)  Advair Diskus 250-50 Mcg/dose  Misc (Fluticasone-Salmeterol) .... One Puf By Mouth Two Times A Day As Needed 7)  Ultram 50 Mg  Tabs (Tramadol Hcl) .Marland Kitchen.. 1-2 By Mouth Q 4 Hours As Needed Pain 8)  Hydrocodone-Acetaminophen 7.5-500 Mg Tabs (Hydrocodone-Acetaminophen) .Marland Kitchen.. 1 Two Times A Day As Needed- 60 Must Last 30 Days 9)  Pristiq 50 Mg Xr24h-Tab (Desvenlafaxine Succinate) .... Two  By Mouth Daily 10)  Meloxicam 15 Mg Tabs (Meloxicam) .... One By Mouth Daily 11)  Cyclobenzaprine Hcl 10 Mg Tabs (Cyclobenzaprine Hcl) .... One By Mouth Three Times A Day 12)  Medrol 4 Mg Tabs (Methylprednisolone) .... Generic One By Mouth Daily  Allergies (verified): 1)  ! Sulfa 2)  ! Codeine  Past History:  Family History: Last updated: 09/15/2006 mother with dementia father with bladder cancer  Social History: Last updated: 09/15/2006 Single Never Smoked Alcohol use-yes  Risk Factors: Smoking Status: current (10/12/2009) Passive Smoke Exposure: yes (10/12/2009)  Past medical, surgical, family and social histories (including risk factors) reviewed, and no changes noted (except as noted below).  Past Medical History: Reviewed history from 09/08/2007 and no changes required. IBS (ICD-564.1) STRICTURE, ESOPHAGEAL (ICD-530.3) chronic back pain Depression Low back pain NECK PAIN  Past Surgical History: Reviewed history from 09/08/2007 and no changes required. Hysterectomy-TAH EGD-05/30/2003 Lumbar surgery  Elsner Lumbar fusion Cervical fusion Cervical laminectomy  Family History: Reviewed history from 09/15/2006 and no changes required. mother with dementia father with bladder cancer  Social History: Reviewed history from 09/15/2006 and no changes required. Single Never Smoked Alcohol use-yes Smoking Status:  current Passive Smoke Exposure:  yes  Review of Systems  The patient denies anorexia, fever, weight loss, weight gain, vision loss, decreased  hearing, hoarseness, chest pain, syncope, dyspnea on exertion, peripheral edema, prolonged cough, headaches, hemoptysis, abdominal pain, melena, hematochezia, severe indigestion/heartburn, hematuria, incontinence, genital sores, muscle weakness, suspicious skin lesions, transient blindness, difficulty walking, depression, unusual weight change, abnormal bleeding, enlarged lymph nodes, and angioedema.    Physical Exam  General:  Well-developed,well-nourished,in no acute distress; alert,appropriate and cooperative throughout examination Head:  Normocephalic and atraumatic without obvious abnormalities. No apparent alopecia or balding. Eyes:  pupils equal and pupils round.   Ears:  R ear normal and L ear normal.   Neck:  nuchal rigidity and decreased ROM.   Lungs:  normal respiratory effort and no wheezes.   Heart:  normal rate, regular rhythm, and no murmur.   Abdomen:  Bowel sounds positive,abdomen soft and non-tender without masses, organomegaly or hernias noted. Msk:  no joint warmth, no redness over joints, joint tenderness, and joint swelling.     Impression & Recommendations:  Problem # 1:  FOOT PAIN, BILATERAL (ICD-729.5) Assessment Deteriorated injection plantar facia Informed consent obtained and then the  plantar facia was prepped in a sterile  manor and 20 mg depo and 1/2 cc 1% lidocaine injected into the synovial space. After care discussed. Pt tolerated procedure well.  Orders: Venipuncture (29528) T- * Misc. Laboratory test [phone]) Injection, Tendon / Ligament (40102) Depo-Medrol 20mg  (J1020)  Complete Medication List: 1)  Xanax 0.5 Mg Tabs (Alprazolam) .... Take one tablet  by mouth as needed 2)  Valium 5 Mg Tabs (Diazepam) .... One by mouth three times a day as needed 3)  Allegra-d 24 Hour 180-240 Mg Tb24 (Fexofenadine-pseudoephedrine) .... One by mouth daily as needed 4)  Estradiol 1 Mg Tabs (Estradiol) .... One by mouth daily 5)  Proair Hfa 108 (90 Base) Mcg/act Aers  (Albuterol sulfate) .... 2 puffs every 6 hours as needed 6)  Advair Diskus 250-50 Mcg/dose Misc (Fluticasone-salmeterol) .... One puf by mouth two times a day as needed 7)  Ultram 50 Mg Tabs (Tramadol hcl) .Marland Kitchen.. 1-2 by mouth q 4 hours as needed pain 8)  Hydrocodone-acetaminophen 7.5-500 Mg Tabs (Hydrocodone-acetaminophen) .Marland Kitchen.. 1 two times a day as needed- 60 must last 30 days 9)  Pristiq 50 Mg Xr24h-tab (Desvenlafaxine succinate) .... Two  by mouth daily 10)  Meloxicam 15 Mg Tabs (Meloxicam) .... One by mouth daily 11)  Cyclobenzaprine Hcl 10 Mg Tabs (Cyclobenzaprine hcl) .... One by mouth three times a day 12)  Medrol 4 Mg Tabs (Methylprednisolone) .... Generic one by mouth daily Prescriptions: MEDROL 4 MG TABS (METHYLPREDNISOLONE) generic one by mouth daily  #30 x 0   Entered and Authorized by:   Stacie Glaze MD   Signed by:   Stacie Glaze MD on 10/12/2009   Method used:   [facility]        [facility] [address] [phone]* (retail)       [address]       [place], [place]  [address]       Ph: [phone] or [phone]       Fax: [phone]   RxID:   [id number]   Appended Document: Excruciating pain in feet/per Laurie [name]/cjr     Allergies: 1)  ! Sulfa 2)  ! Codeine   Impression & Recommendations:  Problem # 1:  PLANTAR FASCIITIS, BILATERAL (ICD-728.71)  clarification: both right and left plantar facia were injected at the time of this visit Her updated medication list for this problem includes:    Meloxicam 15 Mg Tabs (Meloxicam) ..... One by mouth daily  Discussed use of gel inserts, ice massage, and stretching exercises.   Complete Medication List: 1)  Xanax 0.5 Mg Tabs (Alprazolam) .... Take one tablet  by mouth as needed 2)  Valium 5 Mg Tabs (Diazepam) .... One by mouth three times a day as needed 3)  Allegra-d 24 Hour 180-240 Mg Tb24 (Fexofenadine-pseudoephedrine) .... One by mouth daily as needed 4)  Estradiol 1 Mg Tabs  (Estradiol) .... One by mouth daily 5)  Proair Hfa 108 (90 Base) Mcg/act Aers (Albuterol sulfate) .... 2 puffs every 6 hours as needed 6)  Advair Diskus 250-50 Mcg/dose Misc (Fluticasone-salmeterol) .... One puf by mouth two times a day as needed 7)  Ultram 50 Mg Tabs (Tramadol hcl) .Marland Kitchen.. 1-2 by mouth q 4 hours as needed pain 8)  Hydrocodone-acetaminophen 7.5-500 Mg Tabs (Hydrocodone-acetaminophen) .Marland Kitchen.. 1 two times a day as needed- 60 must last 30 days 9)  Pristiq 50 Mg Xr24h-tab (Desvenlafaxine succinate) .... Two  by mouth daily 10)  Meloxicam 15 Mg Tabs (Meloxicam) .... One  by mouth daily 11)  Cyclobenzaprine Hcl 10 Mg Tabs (Cyclobenzaprine hcl) .... One by mouth three times a day 12)  Medrol 4 Mg Tabs (Methylprednisolone) .... Generic one by mouth daily

## 2010-03-15 NOTE — Assessment & Plan Note (Signed)
Summary: 3 month /pt rescd from bump//ccm   Vital Signs:  Patient profile:   55 year old female Height:      71 inches Weight:      178 pounds BMI:     24.92 Temp:     98.2 degrees F oral Pulse rate:   72 / minute Resp:     14 per minute BP sitting:   136 / 80  (left arm)  Vitals Entered By: Willy Eddy, LPN (February 15, 2009 12:18 PM) CC: roa med review   CC:  roa med review.  History of Present Illness: Larey Seat on the ice and now has pain in the left arm, shoulder the pt note some weakness in the left arm and increased shoulder pain some neck and jaw pain she has  a hx of DJD she continues to smoke she had cervical fusion in the past    Preventive Screening-Counseling & Management  Alcohol-Tobacco     Smoking Status: never     Smoking Cessation Counseling: yes  Problems Prior to Update: 1)  Lumbar Radiculopathy, Right  (ICD-724.4) 2)  Cervical Radiculopathy  (ICD-723.4) 3)  Paroxysmal Nocturnal Dyspnea  (ICD-786.09) 4)  Foot Pain, Right  (ICD-729.5) 5)  Low Back Pain  (ICD-724.2) 6)  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 Take One Tablet  By Mouth As Needed 2)  Valium 5 Mg  Tabs (Diazepam) .... One By Mouth Three Times A Day As Needed 3)  Allegra-D 24 Hour 180-240 Mg  Tb24 (Fexofenadine-Pseudoephedrine) .... One By Mouth Daily As Needed 4)  Estradiol 1 Mg Tabs (Estradiol) .... One By Mouth Daily 5)  Proair Hfa 108 (90 Base) Mcg/act  Aers (Albuterol Sulfate) .... 2 Puffs Every 6  Hours As Needed 6)  Advair Diskus 250-50 Mcg/dose  Misc (Fluticasone-Salmeterol) .... One Puf By Mouth Two Times A Day As Needed 7)  Ultram 50 Mg  Tabs (Tramadol Hcl) .Marland Kitchen.. 1-2 By Mouth Q 4 Hours As Needed Pain 8)  Vicodin 5-500 Mg Tabs (Hydrocodone-Acetaminophen) .... One By Mouth Q 6 Hours Prn 9)  Pristiq 50 Mg Xr24h-Tab (Desvenlafaxine Succinate) .... Two  By Mouth Daily 10)  Meloxicam 15 Mg Tabs (Meloxicam) .... One By Mouth Daily 11)  Medrol (Pak) 4 Mg Tabs (Methylprednisolone) .... Take As Directed  Current Medications (verified): 1)  Xanax 0.5 Mg  Tabs (Alprazolam) .... Take One Tablet  By Mouth As Needed 2)  Valium 5 Mg  Tabs (Diazepam) .... One By Mouth Three Times A Day As Needed 3)  Allegra-D 24 Hour 180-240 Mg  Tb24 (Fexofenadine-Pseudoephedrine) .... One By Mouth Daily As Needed 4)  Estradiol 1 Mg Tabs (Estradiol) .... One By Mouth Daily 5)  Proair Hfa 108 (90 Base) Mcg/act  Aers (Albuterol Sulfate) .... 2 Puffs Every 6 Hours As Needed 6)  Advair Diskus 250-50 Mcg/dose  Misc (Fluticasone-Salmeterol) .... One Puf By Mouth Two Times A Day As Needed 7)  Ultram 50 Mg  Tabs (Tramadol Hcl) .Marland Kitchen.. 1-2 By Mouth Q 4 Hours As Needed Pain 8)  Hydrocodone-Acetaminophen 7.5-500 Mg Tabs (Hydrocodone-Acetaminophen) .... One By Mouth Q 6 Hour As Needed Pain 9)  Pristiq 50 Mg Xr24h-Tab (Desvenlafaxine Succinate) .... Two  By Mouth Daily 10)  Meloxicam 15 Mg Tabs (Meloxicam) .... One By Mouth Daily 11)  Methylprednisolone 4 Mg Tabs (Methylprednisolone) .... 4 By Mouth For 4 Days The 3 By Mouth For 4 Days The 2 By Mouth For 4 Days The 1 By Mouth For 4 Days 12)  Cyclobenzaprine Hcl 10 Mg Tabs (Cyclobenzaprine Hcl) .... One By Mouth Three Times A Day  Allergies (verified): 1)  ! Sulfa 2)  ! Codeine  Past History:  Family History: Last updated: 09/15/2006 mother with dementia father with bladder cancer  Social History: Last updated: 09/15/2006 Single Never Smoked Alcohol use-yes  Risk  Factors: Smoking Status: never (02/15/2009)  Past medical, surgical, family and social histories (including risk factors) reviewed, and no changes noted (except as noted below).  Past Medical History: Reviewed history from 09/08/2007 and no changes required. IBS (ICD-564.1) STRICTURE, ESOPHAGEAL (ICD-530.3) chronic back pain Depression Low back pain NECK PAIN  Past Surgical History: Reviewed history from 09/08/2007 and no changes required. Hysterectomy-TAH EGD-05/30/2003 Lumbar surgery  Elsner Lumbar fusion Cervical fusion Cervical laminectomy  Family History: Reviewed history from 09/15/2006 and no changes required. mother with dementia father with bladder cancer  Social History: Reviewed history from 09/15/2006 and no changes required. Single Never Smoked Alcohol use-yes  Review of Systems  The patient denies anorexia, fever, weight loss, weight gain, vision loss, decreased hearing, hoarseness, chest pain, syncope, dyspnea on exertion, peripheral edema, prolonged cough, headaches, hemoptysis, abdominal pain, melena, hematochezia, severe indigestion/heartburn, hematuria, incontinence, genital sores, muscle weakness, suspicious skin lesions, transient blindness, difficulty walking, depression, unusual weight change, abnormal bleeding, enlarged lymph nodes, angioedema, and breast masses.    Physical Exam  General:  Well-developed,well-nourished,in no acute distress; alert,appropriate and cooperative throughout examination Head:  Normocephalic and atraumatic without obvious abnormalities. No apparent alopecia or balding. Ears:  R ear normal and L ear normal.   Neck:  nuchal rigidity and decreased ROM.   Lungs:  normal respiratory effort and no wheezes.   Heart:  normal rate, regular rhythm, and no murmur.   Abdomen:  Bowel sounds positive,abdomen soft and non-tender without masses, organomegaly or hernias noted. Msk:  decreased ROM, joint tenderness, and joint swelling.       Impression & Recommendations:  Problem # 1:  CERVICAL RADICULOPATHY (ICD-723.4)  After the fal she has had increased neck pain suggestive of recurrent radiculopathy There is the rsik that the fall "undid" some of the fusion and has caused instability will obtain films and begin steroids , muscle relasants and pain meds acutely with referral to the neurosurgeon if it persists see the new rx  Orders: T-Cervical Spine Comp 4 Views (72050TC)  Complete Medication List: 1)  Xanax 0.5 Mg Tabs (Alprazolam) .... Take one tablet  by mouth as needed 2)  Valium 5 Mg Tabs (Diazepam) .... One by mouth three times a day as needed 3)  Allegra-d 24 Hour 180-240 Mg Tb24 (Fexofenadine-pseudoephedrine) .... One by mouth daily as needed 4)  Estradiol 1 Mg Tabs (Estradiol) .... One by mouth daily 5)  Proair Hfa 108 (90 Base) Mcg/act Aers (Albuterol sulfate) .... 2 puffs every 6 hours as needed 6)  Advair Diskus 250-50 Mcg/dose Misc (Fluticasone-salmeterol) .... One puf by mouth two times a day as needed 7)  Ultram 50 Mg Tabs (Tramadol hcl) .Marland Kitchen.. 1-2 by mouth q 4 hours as needed  pain 8)  Hydrocodone-acetaminophen 7.5-500 Mg Tabs (Hydrocodone-acetaminophen) .... One by mouth q 6 hour as needed pain 9)  Pristiq 50 Mg Xr24h-tab (Desvenlafaxine succinate) .... Two  by mouth daily 10)  Meloxicam 15 Mg Tabs (Meloxicam) .... One by mouth daily 11)  Methylprednisolone 4 Mg Tabs (Methylprednisolone) .... 4 by mouth for 4 days the 3 by mouth for 4 days the 2 by mouth for 4 days the 1 by mouth for 4 days 12)  Cyclobenzaprine Hcl 10 Mg Tabs (Cyclobenzaprine hcl) .... One by mouth three times a day  Patient Instructions: 1)  Please schedule a follow-up appointment in 1 month. Prescriptions: HYDROCODONE-ACETAMINOPHEN 7.5-500 MG TABS (HYDROCODONE-ACETAMINOPHEN) one by mouth q 6 hour as needed pain  #60 x 0   Entered and Authorized by:   Stacie Glaze MD   Signed by:   Stacie Glaze MD on 02/15/2009   Method used:    Print then Give to Patient   RxID:   [id number] CYCLOBENZAPRINE HCL 10 MG TABS (CYCLOBENZAPRINE HCL) one by mouth three times a day  #60 x 0   Entered and Authorized by:   Stacie Glaze MD   Signed by:   Stacie Glaze MD on 02/15/2009   Method used:   [facility]        [facility] [address] [phone]* (retail)       [address]       [place], [place]  [address]       Ph: [phone] or [phone]       Fax: [phone]   RxID:   [id number] METHYLPREDNISOLONE 4 MG TABS (METHYLPREDNISOLONE) 4 by mouth for 4 days the 3 by mouth for 4 days the 2 by mouth for 4 days the 1 by mouth for 4 days  #50 x 0   Entered and Authorized by:   Stacie Glaze MD   Signed by:   Stacie Glaze MD on 02/15/2009   Method used:   [facility]        [facility] [address] [phone]* (retail)       [address]       [place], [place]  [address]       Ph: [phone] or [phone]       Fax: [phone]   RxID:   [id number]

## 2010-03-15 NOTE — Letter (Signed)
Summary: Cardiac Catheterization Instructions- [facility]  [facility], Main Office  [address]   [place], [place] [address]   Phone: [phone]  Fax: [phone]     11/21/2009 MRN: [id number]  [facility] [address] [place], [place]  [address]  Dear Ms. Eshelman,   You are scheduled for a Cardiac Catheterization on Oct.20th, 2011 with Dr.McAlhany. Please arrive to the 1st floor of the Heart and Vascular Center at [facility] at 6:30 am on the day of your procedure. Please do not arrive before 6:30 a.m. Call the Heart and Vascular Center at [phone] if you are unable to make your appointmnet. The Code to get into the parking garage under the building is 0200. Take the elevators to the 1st floor. You must have someone to drive you home. Someone must be with you for the first 24 hours after you arrive home. Please wear clothes that are easy to get on and off and wear slip-on shoes. Do not eat or drink after midnight except water with your medications that morning. Bring all your medications and current insurance cards with you.   _X__ Make sure you take your aspirin.  __X_ You may take ALL of your medications with water that morning.   The usual length of stay after your procedure is 2 to 3 hours. This can vary.  If you have any questions, please call the office at the number listed above.   Whitney Maeola Sarah RN

## 2010-03-15 NOTE — Progress Notes (Signed)
Summary: med refill  Phone Note Call from Patient Call back at Home Phone [phone]   Caller: Patient Call For: Stacie Glaze MD Summary of Call: pt needs refill on [facility]anax [facility] Initial call taken by: Heron Sabins,  July 12, 2009 2:13 PM  Follow-up for Phone Call        #90 no refills faxed Follow-up by: Kern Reap CMA Duncan Dull),  July 12, 2009 4:42 PM

## 2010-03-15 NOTE — Progress Notes (Signed)
Summary: results  Phone Note Call from Patient Call back at [phone]   Caller: vm Reason for Call: Acute Illness Summary of Call: Results xray feet & labs Initial call taken by: Rudy Jew, RN,  October 13, 2009 12:06 PM  Follow-up for Phone Call        pt informed - wnl Follow-up by: Willy Eddy, LPN,  October 13, 2009 12:37 PM

## 2010-03-15 NOTE — Progress Notes (Signed)
Summary: pt would like test resutls  Medications Added ASPIRIN 81 MG TBEC (ASPIRIN) Take one tablet by mouth daily METOPROLOL TARTRATE 25 MG TABS (METOPROLOL TARTRATE) Take one tablet by mouth twice a day PRAVASTATIN SODIUM 40 MG TABS (PRAVASTATIN SODIUM) Take one tablet by mouth daily at bedtime       Phone Note Call from Patient Call back at [phone]   Caller: Patient Reason for Call: Talk to Nurse, Talk to Doctor, Lab or Test Results Summary of Call: pt phone has been off and she is using her daughters phone and she will be at this number all day please call to give her the results of her monitor Initial call taken by: Omer Jack,  December 12, 2009 11:12 AM  Follow-up for Phone Call        pt. aware & prescriptions called in from her hospital discharge that she had apparenrly lost. She will f/u with [facility] 11/8. Whitney Maeola Sarah RN  December 12, 2009 11:26 AM  Follow-up by: Whitney Maeola Sarah RN,  December 12, 2009 11:26 AM    New/Updated Medications: ASPIRIN 81 MG TBEC (ASPIRIN) Take one tablet by mouth daily METOPROLOL TARTRATE 25 MG TABS (METOPROLOL TARTRATE) Take one tablet by mouth twice a day PRAVASTATIN SODIUM 40 MG TABS (PRAVASTATIN SODIUM) Take one tablet by mouth daily at bedtime Prescriptions: PRAVASTATIN SODIUM 40 MG TABS (PRAVASTATIN SODIUM) Take one tablet by mouth daily at bedtime  #30 x 6   Entered by:   Whitney Maeola Sarah RN   Authorized by:   Verne Carrow, MD   Signed by:   Ellender Hose RN on 12/12/2009   Method used:   Electronically to        [facility] [address] (retail)       [address]       [place], [place]  [address]       Ph: [phone]       Fax: [phone]   RxID:   [id number] METOPROLOL TARTRATE 25 MG TABS (METOPROLOL TARTRATE) Take one tablet by mouth twice a day  #60 x 3   Entered by:   Whitney Maeola Sarah RN   Authorized by:   Verne Carrow, MD   Signed by:   Ellender Hose RN on 12/12/2009   Method used:   Electronically to        [facility] [address] (retail)       [address]       [place], [place]  [address]       Ph: [phone]       Fax: [phone]   RxID:   [id number]

## 2010-03-15 NOTE — Progress Notes (Signed)
Summary: Pt req samples of Prestique and refill of Hydrocodone  Phone Note Call from Patient Call back at [phone] cell   Caller: Patient Complaint: Urinary/GYN Problems Summary of Call: Pt is req samples for Prestique. Also [facility] [address]. Pt is waiting to sch ov til after her medicare and dissability starts.  Initial call taken by: Lucy Antigua,  July 24, 2009 10:38 AM  Follow-up for Phone Call        left message on machine filled hydrocodone 5-10 and has on bottle must last 30 days- samples of pristiq out front Follow-up by: Willy Eddy, LPN,  July 24, 2009 12:32 PM    New/Updated Medications: HYDROCODONE-ACETAMINOPHEN 7.5-500 MG TABS (HYDROCODONE-ACETAMINOPHEN) 1 two times a day as needed- 60 must last 30 days

## 2010-03-15 NOTE — Progress Notes (Signed)
Summary: Pt called to give some new #'s where she can be reached  Phone Note Call from Patient   Caller: Patient Summary of Call: Pt called and said that she is suppose to be getting a call today re: orthopedic doctor and getting stress test done. Pt can be reached at [phone] until 1:30pm today. After that you can reach her at [phone]. Initial call taken by: Lucy Antigua,  October 26, 2009 8:12 AM

## 2010-03-15 NOTE — Progress Notes (Signed)
Summary: Pt req early refill of Hydrocodone. [facility]s [facility] [address]  Phone Note Refill Request Call back at [phone] cell   Refills Requested: Medication #1:  HYDROCODONE-ACETAMINOPHEN 7.5-500 MG TABS 1 two times a day as needed- 60 must last 30 days Pt called and is req early refill of Hydrocodone. Pt says that she has been taking 3 to 4 pills a day for severe back pain.   [facility] [address]    Method Requested: Telephone to Pharmacy Initial call taken by: Lucy Antigua,  November 29, 2009 3:09 PM  Follow-up for Phone Call        hold for bonnye to review timing of early refill Follow-up by: Stacie Glaze MD,  November 29, 2009 4:30 PM    Prescriptions: HYDROCODONE-ACETAMINOPHEN 7.5-500 MG TABS (HYDROCODONE-ACETAMINOPHEN) 1 two times a day as needed- 60 must last 30 days  #60 x 0   Entered by:   Willy Eddy, LPN   Authorized by:   Stacie Glaze MD   Signed by:   Willy Eddy, LPN on 13/09/6576   Method used:   Telephoned to ...       [facility]  [phone]* (retail)       [address] [place], [place]  [address]       Ph: [phone] or [phone]       Fax: [phone]   RxID:   [id number]

## 2010-03-15 NOTE — Progress Notes (Signed)
Summary: med refill and bloodwork results   Phone Note Refill Request   Refills Requested: Medication #1:  HYDROCODONE-ACETAMINOPHEN 7.5-500 MG TABS 1 two times a day as needed- 60 must last 30 days also pt would like bloodwork results  Initial call taken by: Heron Sabins,  November 03, 2009 5:00 PM    Prescriptions: HYDROCODONE-ACETAMINOPHEN 7.5-500 MG TABS (HYDROCODONE-ACETAMINOPHEN) 1 two times a day as needed- 60 must last 30 days  #60 x 0   Entered by:   Willy Eddy, LPN   Authorized by:   Stacie Glaze MD   Signed by:   Willy Eddy, LPN on 09/60/4540   Method used:   Telephoned to ...       [facility] [address] [phone]* (retail)       [address]       [place], [place]  [address]       Ph: [phone] or [phone]       Fax: [phone]   RxID:   [id number]   Appended Document: med refill and bloodwork results  med called and labs not ready left message on machine

## 2010-03-15 NOTE — Telephone Encounter (Signed)
Notify patient that she has been referred to Dr. Ethelene Hal for an epidural

## 2010-03-15 NOTE — Progress Notes (Signed)
Summary: med refill  Phone Note Call from Patient Call back at [phone]   Caller: Patient Call For: Stacie Glaze MD Summary of Call: pt needs refill on xanax [facility] [address] [phone] Initial call taken by: Heron Sabins,  September 29, 2009 9:55 AM  Follow-up for Phone Call        too early not due--man answered phone stating it was time for refill and knew exactly when she got it filled last??not due until 9-2 Follow-up by: Willy Eddy, LPN,  September 29, 2009 11:48 AM

## 2010-03-15 NOTE — Progress Notes (Signed)
Summary: REFILL REQUEST (Hydrocodone-acetaminophen)  Phone Note Refill Request Call back at [phone] Message from:  Patient on Jul 06, 2009 10:58 AM  Refills Requested: Medication #1:  HYDROCODONE-ACETAMINOPHEN 7.5-500 MG TABS one by mouth q 6 hour as needed pain   Notes: Pt req that refill Rx be [facility] Pt can be reached at [phone].Marland KitchenMarland KitchenMarland KitchenPt adv that pharmacy sent request via fax, unknown if fax was received.  Pt wants to advise that she is waiting till September 2011 to make an OV because she presently doesn't have insurance and will be going on Medicare in September 2011 per patient.Marland KitchenMarland KitchenMarland KitchenPt adv she has been exp back pain and needs med??... Pt was adv that her request would be passed along.     Initial call taken by: Debbra Riding,  Jul 06, 2009 11:00 AM  Follow-up for Phone Call        denied  Follow-up by: Willy Eddy, LPN,  Jul 06, 2009 11:08 AM

## 2010-03-15 NOTE — Procedures (Signed)
Summary: Summary Report  Summary Report   Imported By: Erle Crocker 01/03/2010 15:49:04  _____________________________________________________________________  External Attachment:    Type:   Image     Comment:   External Document

## 2010-03-15 NOTE — Progress Notes (Signed)
Summary: vicodan last filled 5-30- not due until 6-30  Phone Note Call from Patient   Caller: Patient   [phone] Reason for Call: Refill Medication Summary of Call: Pt called in to advise that [facility] [address] [facility] Maroon is telling her that they have not received a Rx for her med: Hydrocodone... Pt requests that [facility] be [facility] to [facility] Rx and give information if possible.  Initial call taken by: Debbra Riding,  July 31, 2009 2:41 PM  Follow-up for Phone Call        pt informed - last filled 5-30 not due until 6-30 Follow-up by: Willy Eddy, LPN,  July 31, 2009 2:59 PM

## 2010-03-15 NOTE — Progress Notes (Signed)
Summary: Pt requests we ask any MD for refill  Phone Note Call from Patient   Caller: Patient Call For: Stacie Glaze MD Summary of Call: Pt requesting refill on Vicodin.  [facility]  Pt knows that Dr. Lovell Sheehan is not here, and wants to ask another MD. [phone] Initial call taken by: Lynann Beaver CMA,  June 01, 2009 4:30 PM  Follow-up for Phone Call        ok per dr Scotty Court x 1 called Follow-up by: Pura Spice, RN,  June 01, 2009 4:53 PM    Prescriptions: HYDROCODONE-ACETAMINOPHEN 7.5-500 MG TABS (HYDROCODONE-ACETAMINOPHEN) one by mouth q 6 hour as needed pain  #60 x 0   Entered by:   Lynann Beaver CMA   Authorized by:   Stacie Glaze MD   Signed by:   Lynann Beaver CMA on 06/01/2009   Method used:   Telephoned to ...       [facility] [address] [phone]* (retail)       [address]       [place], [place]  [address]       Ph: [phone] or [phone]       Fax: [phone]   RxID:   [id number]

## 2010-03-15 NOTE — Assessment & Plan Note (Signed)
Summary: CPX//SLM   Vital Signs:  Patient profile:   55 year old female Height:      71 inches Weight:      170 pounds BMI:     23.80 Temp:     98.2 degrees F oral Pulse rate:   72 / minute Resp:     14 per minute BP sitting:   140 / 80  (left arm)  Vitals Entered By: Willy Eddy, LPN (October 25, 2009 2:58 PM) CC: annual visit for disease managment-  last mam was 2 years ago- states colon was 6 years agot--fasting since last night Is Patient Diabetic? No  Vision Screening:Left eye with correction: 20 / 20 Right eye with correction: 20 / 20 Both eyes with correction: 20 / 20  Color vision testing: normal       Primary Care Provider:  Stacie Glaze MD  CC:  annual visit for disease managment-  last mam was 2 years ago- states colon was 6 years agot--fasting since last night.  History of Present Illness: persistant foot pain did not respond to facial injection pain is in the metatarsal area and increases with standing does not have a classice faciatis pattern  has reinjured back and has noted some increased spasm  noted increased palpitations bu has increased stressors has noted dysparunea The pt was asked about all immunizations, health maint. services that are appropriate to their age and was given guidance on diet exercize  and weight management WELCOME TO MEDICARE Here for Medicare AWV:  1.   Risk factors based on Past M, S, F history:  see notes  2.   Physical Activities:  limited due to back pain 3.   Depression/mood:  chronic depression on medications 4.   Hearing: whispered voice at 6 feet 5.   ADL's:  no limitaitons 6.   Fall Risk:  none noted 7.   Home Safety:  none detected 8.   Height, weight, &visual acuity: see vitals 9.   Counseling:  10.   Labs ordered based on risk factors:  see labs 11.           Referral Coordination referral for managrams, to orthopedics and to cardiology 12.           Care Plan labs today and referrals for abnormal  EKG 13.            Cognitive Assessment alert and oriented, memory intact, judement fair and can balance check book   Preventive Screening-Counseling & Management  Alcohol-Tobacco     Smoking Status: current     Smoking Cessation Counseling: yes     Smoke Cessation Stage: contemplative     Passive Smoke Exposure: yes     Tobacco Counseling: to quit use of tobacco products  Hep-HIV-STD-Contraception     HIV Risk: no risk noted     Sun Exposure-Excessive: yes     Sun Exposure Counseling: yes  Safety-Violence-Falls     Smoke Detectors: yes     Violence in the Home: no risk noted     Sexual Abuse: no     Fall Risk: none  Problems Prior to Update: 1)  Plantar Fasciitis, Bilateral  (ICD-728.71) 2)  Foot Pain, Bilateral  (ICD-729.5) 3)  Lumbar Radiculopathy, Right  (ICD-724.4) 4)  Cervical Radiculopathy  (ICD-723.4) 5)  Paroxysmal Nocturnal Dyspnea  (ICD-786.09) 6)  Foot Pain, Right  (ICD-729.5) 7)  Low Back Pain  (ICD-724.2) 8)  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) 9)  Degenerative Disc  Disease, Cervical Spine, W/radiculopathy  (ICD-722.0) 10)  Tobacco Use  (ICD-305.1) 11)  Bronchitis, Acute With Mild Bronchospasm  (ICD-466.0) 12)  Osteoarthrosis, Local Nos, Lower Leg  (ICD-715.36) 13)  State, Symptomatic Menopause/fem Climacteric  (ICD-627.2) 14)  Depression  (ICD-311) 15)  Lumbar Radiculopathy, Left  (ICD-724.4) 16)  Ibs  (ICD-564.1) 17)  Stricture, Esophageal  (ICD-530.3)  Current Problems (verified): 1)  Plantar Fasciitis, Bilateral  (ICD-728.71) 2)  Foot Pain, Bilateral  (ICD-729.5) 3)  Lumbar Radiculopathy, Right  (ICD-724.4) 4)  Cervical Radiculopathy  (ICD-723.4) 5)  Paroxysmal Nocturnal Dyspnea  (ICD-786.09) 6)  Foot Pain, Right  (ICD-729.5) 7)  Low Back Pain  (ICD-724.2) 8)  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) 9)  Degenerative Disc Disease, Cervical Spine, W/radiculopathy  (ICD-722.0) 10)  Tobacco Use  (ICD-305.1) 11)  Bronchitis, Acute With Mild Bronchospasm   (ICD-466.0) 12)  Osteoarthrosis, Local Nos, Lower Leg  (ICD-715.36) 13)  State, Symptomatic Menopause/fem Climacteric  (ICD-627.2) 14)  Depression  (ICD-311) 15)  Lumbar Radiculopathy, Left  (ICD-724.4) 16)  Ibs  (ICD-564.1) 17)  Stricture, Esophageal  (ICD-530.3)  Medications Prior to Update: 1)  Xanax 0.5 Mg  Tabs (Alprazolam) .... Take One Tablet  By Mouth As Needed 2)  Valium 5 Mg  Tabs (Diazepam) .... One By Mouth Three Times A Day As Needed 3)  Allegra-D 24 Hour 180-240 Mg  Tb24 (Fexofenadine-Pseudoephedrine) .... One By Mouth Daily As Needed 4)  Estradiol 1 Mg Tabs (Estradiol) .... One By Mouth Daily 5)  Proair Hfa 108 (90 Base) Mcg/act  Aers (Albuterol Sulfate) .... 2 Puffs Every 6 Hours As Needed 6)  Advair Diskus 250-50 Mcg/dose  Misc (Fluticasone-Salmeterol) .... One Puf By Mouth Two Times A Day As Needed 7)  Ultram 50 Mg  Tabs (Tramadol Hcl) .Marland Kitchen.. 1-2 By Mouth Q 4 Hours As Needed Pain 8)  Hydrocodone-Acetaminophen 7.5-500 Mg Tabs (Hydrocodone-Acetaminophen) .Marland Kitchen.. 1 Two Times A Day As Needed- 60 Must Last 30 Days 9)  Pristiq 50 Mg Xr24h-Tab (Desvenlafaxine Succinate) .... Two  By Mouth Daily 10)  Meloxicam 15 Mg Tabs (Meloxicam) .... One By Mouth Daily 11)  Cyclobenzaprine Hcl 10 Mg Tabs (Cyclobenzaprine Hcl) .... One By Mouth Three Times A Day 12)  Medrol 4 Mg Tabs (Methylprednisolone) .... Generic One By Mouth Daily  Current Medications (verified): 1)  Xanax 0.5 Mg  Tabs (Alprazolam) .... Take One Tablet  By Mouth As Needed 2)  Valium 5 Mg  Tabs (Diazepam) .... One By Mouth Three Times A Day As Needed 3)  Allegra-D 24 Hour 180-240 Mg  Tb24 (Fexofenadine-Pseudoephedrine) .... One By Mouth Daily As Needed 4)  Hydrocodone-Acetaminophen 7.5-500 Mg Tabs (Hydrocodone-Acetaminophen) .Marland Kitchen.. 1 Two Times A Day As Needed- 60 Must Last 30 Days 5)  Pristiq 50 Mg Xr24h-Tab (Desvenlafaxine Succinate) .... Two  By Mouth Daily  Allergies (verified): 1)  ! Sulfa 2)  ! Codeine  Past  History:  Family History: Last updated: 09/15/2006 mother with dementia father with bladder cancer  Social History: Last updated: 09/15/2006 Single Never Smoked Alcohol use-yes  Risk Factors: Smoking Status: current (10/25/2009) Passive Smoke Exposure: yes (10/25/2009)  Past medical, surgical, family and social histories (including risk factors) reviewed, and no changes noted (except as noted below).  Past Medical History: Reviewed history from 09/08/2007 and no changes required. IBS (ICD-564.1) STRICTURE, ESOPHAGEAL (ICD-530.3) chronic back pain Depression Low back pain NECK PAIN  Past Surgical History: Reviewed history from 09/08/2007 and no changes required. Hysterectomy-TAH EGD-05/30/2003 Lumbar surgery  Elsner Lumbar fusion Cervical fusion Cervical  laminectomy  Family History: Reviewed history from 09/15/2006 and no changes required. mother with dementia father with bladder cancer  Social History: Reviewed history from 09/15/2006 and no changes required. Single Never Smoked Alcohol use-yes Fall Risk:  none Sun Exposure-Excessive:  yes HIV Risk:  no risk noted  Review of Systems  The patient denies anorexia, fever, weight loss, weight gain, vision loss, decreased hearing, hoarseness, chest pain, syncope, dyspnea on exertion, peripheral edema, prolonged cough, headaches, hemoptysis, abdominal pain, melena, hematochezia, severe indigestion/heartburn, hematuria, incontinence, genital sores, muscle weakness, suspicious skin lesions, transient blindness, difficulty walking, depression, unusual weight change, abnormal bleeding, enlarged lymph nodes, angioedema, and breast masses.         Flu Vaccine Consent Questions     Do you have a history of severe allergic reactions to this vaccine? no    Any prior history of allergic reactions to egg and/or gelatin? no    Do you have a sensitivity to the preservative Thimersol? no    Do you have a past history of  Guillan-Barre Syndrome? no    Do you currently have an acute febrile illness? no    Have you ever had a severe reaction to latex? no    Vaccine information given and explained to patient? yes    Are you currently pregnant? no    Lot Number:[id number]   Exp Date:08/11/2010   Site Given  Left Deltoid IM   Physical Exam  General:  Well-developed,well-nourished,in no acute distress; alert,appropriate and cooperative throughout examination Head:  Normocephalic and atraumatic without obvious abnormalities. No apparent alopecia or balding. Eyes:  pupils equal and pupils round.   Ears:  R ear normal and L ear normal.   Nose:  no external deformity and no nasal discharge.   Mouth:  posterior lymphoid hypertrophy and postnasal drip.   Neck:  nuchal rigidity and decreased ROM.   Chest Wall:  No deformities, masses, or tenderness noted. Breasts:  No mass, nodules, thickening, tenderness, bulging, retraction, inflamation, nipple discharge or skin changes noted.   Lungs:  normal respiratory effort and no wheezes.   Heart:  normal rate, regular rhythm, and no murmur.   Abdomen:  Bowel sounds positive,abdomen soft and non-tender without masses, organomegaly or hernias noted. Msk:  joint tenderness, joint swelling, lumbar lordosis, and SI joint tenderness.   Pulses:  R radial normal, R posterior tibial normal, R dorsalis pedis normal, R carotid normal, L radial normal, L posterior tibial normal, and L dorsalis pedis normal.   Extremities:  No clubbing, cyanosis, edema, or deformity noted with normal full range of motion of all joints.   Neurologic:  alert & oriented X3 and DTRs symmetrical and normal.   Skin:  turgor normal and color normal.   Cervical Nodes:  No lymphadenopathy noted Axillary Nodes:  No palpable lymphadenopathy Psych:  Oriented X3 and not anxious appearing.     Impression & Recommendations:  Problem # 1:  PREVENTIVE HEALTH CARE (ICD-V70.0) Assessment Deteriorated labs  ordered Orders: EKG w/ Interpretation (93000) Welcome to Medicare, Physical (R4854) TLB-BMP (Basic Metabolic Panel-BMET) (80048-METABOL) TLB-CBC Platelet - w/Differential (85025-CBCD) TLB-Hepatic/Liver Function Pnl (80076-HEPATIC) TLB-TSH (Thyroid Stimulating Hormone) (84443-TSH) TLB-Lipid Panel (80061-LIPID)  abnormal refer for stress testing referral for mamogram  Mammogram: normal (11/11/2007) Td Booster: Tdap (10/25/2009)   Flu Vax: Fluvax 3+ (10/25/2009)   Next mammogram due:: 11/2008 (10/25/2009)  Discussed using sunscreen, use of alcohol, drug use, self breast exam, routine dental care, routine eye care, schedule for GYN exam, routine physical exam, seat  belts, multiple vitamins, osteoporosis prevention, adequate calcium intake in diet, recommendations for immunizations, mammograms and Pap smears.  Discussed exercise and checking cholesterol.  Discussed gun safety, safe sex, and contraception.  Problem # 2:  FOOT PAIN, BILATERAL (ICD-729.5) Assessment: Deteriorated referral for persistnat foot pain Orders: Orthopedic Surgeon Referral (Ortho Surgeon)  Problem # 3:  ELECTROCARDIOGRAM, ABNORMAL (ICD-794.31)  SHE HAD AN ECHO IN THE PAST 2003 NORMAL SHE HAS BEEN ON PAIN MEDS AND STILL SMOKES THIS MAY HAVE MASKED AN EVENT SHE HAS NOTED HEART "PALPITATIONS" RISK HIGH  Orders: Cardiology Referral (Cardiology) TLB-Cardiac Panel [phone])  Complete Medication List: 1)  Xanax 0.5 Mg Tabs (Alprazolam) .... Take one tablet  by mouth as needed 2)  Valium 5 Mg Tabs (Diazepam) .... One by mouth three times a day as needed 3)  Allegra-d 24 Hour 180-240 Mg Tb24 (Fexofenadine-pseudoephedrine) .... One by mouth daily as needed 4)  Hydrocodone-acetaminophen 7.5-500 Mg Tabs (Hydrocodone-acetaminophen) .Marland Kitchen.. 1 two times a day as needed- 60 must last 30 days 5)  Pristiq 50 Mg Xr24h-tab (Desvenlafaxine succinate) .... Two  by mouth daily  Other Orders: Tdap => 28yrs IM (30865) Admin  1st Vaccine (78469) Flu Vaccine 25yrs + MEDICARE PATIENTS (G2952) Administration Flu vaccine - MCR (W4132)  Patient Instructions: 1)  Please schedule a follow-up appointment in 1 month.   Preventive Care Screening  Mammogram:    Date:  11/11/2007    Next Due:  11/2008    Results:  normal   Last Tetanus Booster:    Date:  10/25/2009    Results:  Tdap   Contraindications/Deferment of Procedures/Staging:    Test/Procedure: PAP Smear    Reason for deferment: hysterectomy    Immunizations Administered:  Tetanus Vaccine:    Vaccine Type: Tdap    Site: right deltoid    Mfr: GlaxoSmithKline    Dose: 0.5 ml    Route: IM    Given by: Willy Eddy, LPN    Exp. Date: 10/14/2011    Lot #: [id number]    VIS given: 12/30/07 version given October 25, 2009.    Prevention & Chronic Care Immunizations   Influenza vaccine: Fluvax 3+  (10/25/2009)    Tetanus booster: 10/25/2009: Tdap    Pneumococcal vaccine: Not documented  Colorectal Screening   Hemoccult: Not documented    Colonoscopy: Not documented  Other Screening   Pap smear: Not documented   Pap smear action/deferral: hysterectomy  (10/25/2009)    Mammogram: normal  (11/11/2007)   Mammogram action/deferral: Ordered  (10/25/2009)   Mammogram due: 11/2008   Smoking status: current  (10/25/2009)   Smoking cessation counseling: yes  (10/25/2009)  Lipids   Total Cholesterol: Not documented   LDL: Not documented   LDL Direct: Not documented   HDL: Not documented   Triglycerides: Not documented   Nursing Instructions: Schedule screening mammogram (see order)   Appended Document: Orders Update     Clinical Lists Changes  Orders: Added new Service order of Venipuncture (36644) - Signed Added new Service order of Specimen Handling (03474) - Signed

## 2010-03-15 NOTE — Telephone Encounter (Signed)
Left message on machine-referral for dr Terese Door has been sent to terri

## 2010-03-15 NOTE — Progress Notes (Signed)
Summary: stress test result and bloodwork results  Phone Note Call from Patient Call back at [phone]   Caller: Patient Call For: Stacie Glaze MD Reason for Call: Acute Illness Summary of Call: pt is requesting stress test and blood work results Initial call taken by: Heron Sabins,  October 31, 2009 9:43 AM  Follow-up for Phone Call        per dr Lovell Sheehan- abn cardiolite- need cardiology consult- lipid panel elvated- needs to to watch diet and exercise more and repeat cbc Follow-up by: Willy Eddy, LPN,  October 31, 2009 11:02 AM  New Problems: HYPERTENSION (ICD-401.9)   New Problems: HYPERTENSION (ICD-401.9)

## 2010-03-15 NOTE — Progress Notes (Signed)
Summary: med change  Phone Note Refill Request Call back at Home Phone [phone] Message from:  Patient  Refills Requested: Medication #1:  HYDROCODONE-ACETAMINOPHEN 7.5-500 MG TABS 1 two times a day as needed- 60 must last 30 days pt is requesting 120 pills to last for 2 month cheaper [facility] [address]  Initial call taken by: Heron Sabins,  November 02, 2009 2:23 PM  Follow-up for Phone Call        can't do that her insurance will only pay for 30 day supply and the dosing has been documenting in the pharmacy Follow-up by: Stacie Glaze MD,  November 03, 2009 8:26 AM  Additional Follow-up for Phone Call Additional follow up Details #1::        [facility] Additional Follow-up by: Lynann Beaver CMA,  November 03, 2009 9:23 AM    Additional Follow-up for Phone Call Additional follow up Details #2::    Notified pt. Follow-up by: Lynann Beaver CMA,  November 03, 2009 9:24 AM

## 2010-03-15 NOTE — Telephone Encounter (Signed)
l °

## 2010-03-15 NOTE — Progress Notes (Signed)
Summary: Pt is req refill of Hydrocodone  Phone Note Call from Patient Call back at Home Phone [phone]   Caller: Patient Summary of Call: Pt req refill of Hydrocodone. [facility] [address]  Initial call taken by: Lucy Antigua,  April 07, 2009 1:18 PM    Prescriptions: HYDROCODONE-ACETAMINOPHEN 7.5-500 MG TABS (HYDROCODONE-ACETAMINOPHEN) one by mouth q 6 hour as needed pain  #60 x 0   Entered by:   Willy Eddy, LPN   Authorized by:   Stacie Glaze MD   Signed by:   Willy Eddy, LPN on 78/29/5621   Method used:   Telephoned to ...       [facility] [address] [phone]* (retail)       [address]       [place], [place]  [address]       Ph: [phone] or [phone]       Fax: [phone]   RxID:   [id number]   Appended Document: Pt is req refill of Hydrocodone faxed refill request  Appended Document: Pt is req refill of Hydrocodone WRONG PHARMACY  Appended Document: Pt is req refill of Hydrocodone [facility] [facility])

## 2010-03-15 NOTE — Progress Notes (Signed)
Summary: refill abx that she had a month ago  Phone Note Call from Patient Call back at Home Phone [phone]   Caller: Patient----triage vm Complaint: Urinary/GYN Problems Summary of Call: wants refill of abx. Has a bladder infection and a low grade fever for the last 2 days.  [facility] Initial call taken by: Warnell Forester,  January 01, 2010 9:08 AM  Follow-up for Phone Call        will see dr Milinda Cave at 11:00 today. Follow-up by: Warnell Forester,  January 01, 2010 10:22 AM

## 2010-03-15 NOTE — Assessment & Plan Note (Signed)
Summary: np6/abn stress test/jml  Medications Added PRISTIQ 50 MG XR24H-TAB (DESVENLAFAXINE SUCCINATE) 1 tab once daily        Visit Type:  new pt visit Primary Provider:  Stacie Glaze MD  CC:  pt here today for abnormal stress test....chest discomfort...sob....denies any edema today...pt states when her heart beats fast she feels dizzy and lightheaded.  History of Present Illness: 55 yo WF with h/o tobacco abuse, borderline hyperlipidemia and family history of CAD who is referred today for evaluation of chest pain. She was recently seen by Dr. Lovell Sheehan and was sent for a stress myoview. Her perfusion images were normal with normal LVEF but she did have ST segment depression in recovery and a hypertensive response to exercise.  She was sent to the ED on September 15th, 2011 with episode of chest pain that she felt was related to something stuck in her esophagus. This pain was substernal squeezing. EKG and labs ok. She was not admitted. This pain resolved after 6-8 hours. No chest pain since then. She has a history of rheumatic fever as a child.   She has noticed almost daily episodes of palpitations with rapid heart rate. She feels dizzy and weak. This lasts for ten minutes. No syncope. She notices frequent "heavy" heart beats associated with dizziness.   Cath 02/25/01 reviewed. Normal coronary arteries (Hochrein).  Current Medications (verified): 1)  Xanax 0.5 Mg  Tabs (Alprazolam) .... Take One Tablet  By Mouth As Needed 2)  Valium 5 Mg  Tabs (Diazepam) .... One By Mouth Three Times A Day As Needed 3)  Hydrocodone-Acetaminophen 7.5-500 Mg Tabs (Hydrocodone-Acetaminophen) .Marland Kitchen.. 1 Two Times A Day As Needed- 60 Must Last 30 Days 4)  Pristiq 50 Mg Xr24h-Tab (Desvenlafaxine Succinate) .Marland Kitchen.. 1 Tab Once Daily 5)  Amoxicillin 500 Mg Caps (Amoxicillin) .Marland Kitchen.. 1 Three Times A Day For 10  Days  Allergies: 1)  ! Sulfa 2)  ! Codeine  Past History:  Past Medical History: IBS (ICD-564.1) STRICTURE,  ESOPHAGEAL (ICD-530.3) s/p dilatation x 3. chronic back pain Depression Low back pain NECK PAIN  Past Surgical History: Hysterectomy-TAH EGD-05/30/2003 Lumbar surgery  Elsner Lumbar fusion Cervical fusion Cervical laminectomy Tonsillectomy and adenoidectomy Appendectomy Right finger surgery  Family History: mother with dementia, deceased  father deceased,  CAD/MI died from bladder cancer 5 brothers and 1sister. 3 brothers are deceased. One died from esophageal cancer, one with aortic aneurysm. Paternal grandmother and paternal aunt died from MI  Social History: Single 1 child Smoked 1/2 ppd for the last 10 years.  Alcohol use-yes, occasional No illicit drug use Works as a Interior and spatial designer at [employer].   Review of Systems       The patient complains of chest pain and palpitations.  The patient denies fatigue, malaise, fever, weight gain/loss, vision loss, decreased hearing, hoarseness, shortness of breath, prolonged cough, wheezing, sleep apnea, coughing up blood, abdominal pain, blood in stool, nausea, vomiting, diarrhea, heartburn, incontinence, blood in urine, muscle weakness, joint pain, leg swelling, rash, skin lesions, headache, fainting, dizziness, depression, anxiety, enlarged lymph nodes, easy bruising or bleeding, and environmental allergies.    Vital Signs:  Patient profile:   55 year old female Height:      71 inches Weight:      172.4 pounds BMI:     24.13 Pulse rate:   68 / minute Pulse rhythm:   irregular BP sitting:   142 / 80  (left arm) Cuff size:   regular  Vitals Entered By: Danielle Rankin, CMA (  November 21, 2009 10:33 AM)  Physical Exam  General:  General: Well developed, well nourished, NAD HEENT: OP clear, mucus membranes moist SKIN: warm, dry Neuro: No focal deficits Musculoskeletal: Muscle strength 5/5 all ext Psychiatric: Mood and affect normal Neck: No JVD, no carotid bruits, no thyromegaly, no lymphadenopathy. Lungs:Clear bilaterally, no  wheezes, rhonci, crackles CV: RRR no murmurs, gallops rubs Abdomen: soft, NT, ND, BS present Extremities: No edema, pulses 2+.    EKG  Procedure date:  11/21/2009  Findings:      NSR, rate 68 bpm. Diffuse T wave inversion, ST changes interior and lateral/anterolateral leads.   Impression & Recommendations:  Problem # 1:  CHEST PAIN-PRECORDIAL (ICD-786.51) Her risk factors for CAD include hyperlipidemia and tobacco abuse. She does have a family history of CAD. Perfusion images on recent stress test normal however her resting EKG shows diffuse T wave changes and ST segment changes. In recovery after maximal stress, ST segment depression. Have discussed cardiac cath to exclude obstructive CAD. Risks and benefits reviewed. Will arrange for Thursday November 30, 2009 in the outpatient cath lab at [facility]. Will also check echo to exclude any active valvular issues. She has a history of rheumatic fever as a child.   Problem # 2:  ABNORMAL CV (STRESS) TEST (ICD-794.39)  See above.   Orders: TLB-BMP (Basic Metabolic Panel-BMET) (80048-METABOL) TLB-CBC Platelet - w/Differential (85025-CBCD) TLB-PT (Protime) (85610-PTP)  Problem # 3:  PALPITATIONS (ICD-785.1)  Will have her wear a 48 hour Holter monitor to exclude any malignant arrhythmias.   Orders: Holter (Holter)  Other Orders: EKG w/ Interpretation (93000) Echocardiogram (Echo) Cardiac Catheterization (Cardiac Cath)  Patient Instructions: 1)  Your physician recommends that you schedule a follow-up appointment in: 3-4 weeks. 2)  Your physician recommends that you continue on your current medications as directed. Please refer to the Current Medication list given to you today. 3)  Your physician has requested that you have an echocardiogram.  Echocardiography is a painless test that uses sound waves to create images of your heart. It provides your doctor with information about the size and shape of your heart and how well  your heart's chambers and valves are working.  This procedure takes approximately one hour. There are no restrictions for this procedure. 4)  Your physician has recommended that you wear a 48 hour holter monitor.  Holter monitors are medical devices that record the heart's electrical activity. Doctors most often use these monitors to diagnose arrhythmias. Arrhythmias are problems with the speed or rhythm of the heartbeat. The monitor is a small, portable device. You can wear one while you do your normal daily activities. This is usually used to diagnose what is causing palpitations/syncope (passing out). 5)  Your physician has requested that you have a cardiac catheterization on Thursday Oct. 20th, 2011 @ 7:30 am. Please be there at 6:30am. Cardiac catheterization is used to diagnose and/or treat various heart conditions. Doctors may recommend this procedure for a number of different reasons. The most common reason is to evaluate chest pain. Chest pain can be a symptom of coronary artery disease (CAD), and cardiac catheterization can show whether plaque is narrowing or blocking your heart's arteries. This procedure is also used to evaluate the valves, as well as measure the blood flow and oxygen levels in different parts of your heart.  For further information please visit [email or web address]  Please follow instruction sheet, as given.

## 2010-03-15 NOTE — Progress Notes (Signed)
Summary: refill diazepam  Phone Note From Pharmacy   [facility] [address] [phone]* Call For: jenkins  Summary of Call: refill diazepam 5mg  1 by mouth three times a day as needed  Initial call taken by: Alfred Levins, CMA,  February 07, 2010 10:15 AM  Follow-up for Phone Call        may refill x 3 Follow-up by: Stacie Glaze MD,  February 07, 2010 2:40 PM  Additional Follow-up for Phone Call Additional follow up Details #1::        Phone call completed, Pharmacist called Additional Follow-up by: Alfred Levins, CMA,  February 07, 2010 4:28 PM    Prescriptions: VALIUM 5 MG  TABS (DIAZEPAM) one by mouth three times a day as needed  #30 x 3   Entered by:   Alfred Levins, CMA   Authorized by:   Stacie Glaze MD   Signed by:   Alfred Levins, CMA on 02/07/2010   Method used:   Telephoned to ...       [facility] [address] [phone]* (retail)       [address]       [place], [place]  [address]       Ph: [phone] or [phone]       Fax: [phone]   RxID:   [id number]

## 2010-03-15 NOTE — Letter (Signed)
Summary: Results Follow-up  [facility], Main Office  [address]   [place], [place] [address]   Phone: [phone]  Fax: [phone]     December 12, 2009 MRN: [id number]   [facility] [address] [place], [place]  [address]   Dear Ms. Delsignore,  We have received the results from your recent tests and have been unable to contact you.  Please call our office at the number listed above so that Dr. Clifton James  or his nurse may review the results with you.    Thank you,  [facility]

## 2010-03-15 NOTE — Progress Notes (Signed)
Summary: Rx Refills  Phone Note Refill Request Call back at Home Phone [phone] Message from:  Patient  Refills Requested: Medication #1:  HYDROCODONE-ACETAMINOPHEN 7.5-500 MG TABS 1 two times a day as needed- 60 must last 30 days Patient states she also needs refill of amoxycillin 500mg  that Dr. Lovell Sheehan prescribed to her a month ago.  Still having bladder pain, needs asap    Method Requested: Electronic Initial call taken by: Trixie Dredge,  January 01, 2010 9:07 AM    Prescriptions: HYDROCODONE-ACETAMINOPHEN 7.5-500 MG TABS (HYDROCODONE-ACETAMINOPHEN) 1 two times a day as needed- 60 must last 30 days  #60 x 0   Entered by:   Willy Eddy, LPN   Authorized by:   Stacie Glaze MD   Signed by:   Willy Eddy, LPN on 09/81/1914   Method used:   Telephoned to ...       [facility]  [phone]* (retail)       [address] [place], [place]  [address]       Ph: [phone] or [phone]       Fax: [phone]   RxID:   [id number]

## 2010-03-15 NOTE — Assessment & Plan Note (Signed)
Summary: 3 MNTH ROV//SLM   Vital Signs:  Patient profile:   55 year old female Height:      71 inches Weight:      170 pounds BMI:     23.80 Temp:     98.2 degrees F oral Pulse rate:   76 / minute Resp:     14 per minute BP sitting:   130 / 80  (left arm)  Vitals Entered By: Willy Eddy, LPN (January 19, 2010 1:55 PM) CC: roa, Hypertension Management Is Patient Diabetic? No   Primary Care Provider:  Stacie Glaze MD  CC:  roa and Hypertension Management.  History of Present Illness: Increased neck pain and Head aches. Has increased IC symptoms She was given medicatins  by the cardiology consultant for  metroprolol and pravasttin but she did not start these.... she has been taking more vicodin ( three times a day) She hear that the BB would make her tired so she did not start this! She continues to smoke! She has discussed goint for exercize  Hypertension History:      She denies headache, chest pain, palpitations, dyspnea with exertion, orthopnea, PND, peripheral edema, visual symptoms, neurologic problems, syncope, and side effects from treatment.        Positive major cardiovascular risk factors include female age 53 years old or older, hypertension, and current tobacco user.        Positive history for target organ damage include ASHD (either angina/prior MI/prior CABG).     Preventive Screening-Counseling & Management  Alcohol-Tobacco     Smoking Status: current     Smoking Cessation Counseling: yes     Smoke Cessation Stage: contemplative     Passive Smoke Exposure: yes     Tobacco Counseling: to quit use of tobacco products  Problems Prior to Update: 1)  Swelling, Neck  (ICD-784.2) 2)  Interstitial Cystitis  (ICD-595.1) 3)  Cad, Native Vessel  (ICD-414.01) 4)  Palpitations  (ICD-785.1) 5)  Abnormal Cv (STRESS) Test  (ICD-794.39) 6)  Chest Pain-precordial  (ICD-786.51) 7)  Chest Pain-precordial  (ICD-786.51) 8)  Palpitations  (ICD-785.1) 9)  Chest  Pain  (ICD-786.50) 10)  Hypertension  (ICD-401.9) 11)  Electrocardiogram, Abnormal  (ICD-794.31) 12)  Preventive Health Care  (ICD-V70.0) 13)  Plantar Fasciitis, Bilateral  (ICD-728.71) 14)  Foot Pain, Bilateral  (ICD-729.5) 15)  Lumbar Radiculopathy, Right  (ICD-724.4) 16)  Cervical Radiculopathy  (ICD-723.4) 17)  Paroxysmal Nocturnal Dyspnea  (ICD-786.09) 18)  Foot Pain, Right  (ICD-729.5) 19)  Low Back Pain  (ICD-724.2) 20)  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) 21)  Degenerative Disc Disease, Cervical Spine, W/radiculopathy  (ICD-722.0) 22)  Tobacco Use  (ICD-305.1) 23)  Bronchitis, Acute With Mild Bronchospasm  (ICD-466.0) 24)  Osteoarthrosis, Local Nos, Lower Leg  (ICD-715.36) 25)  State, Symptomatic Menopause/fem Climacteric  (ICD-627.2) 26)  Depression  (ICD-311) 27)  Lumbar Radiculopathy, Left  (ICD-724.4) 28)  Ibs  (ICD-564.1) 29)  Stricture, Esophageal  (ICD-530.3)  Current Problems (verified): 1)  Swelling, Neck  (ICD-784.2) 2)  Interstitial Cystitis  (ICD-595.1) 3)  Cad, Native Vessel  (ICD-414.01) 4)  Palpitations  (ICD-785.1) 5)  Abnormal Cv (STRESS) Test  (ICD-794.39) 6)  Chest Pain-precordial  (ICD-786.51) 7)  Chest Pain-precordial  (ICD-786.51) 8)  Palpitations  (ICD-785.1) 9)  Chest Pain  (ICD-786.50) 10)  Hypertension  (ICD-401.9) 11)  Electrocardiogram, Abnormal  (ICD-794.31) 12)  Preventive Health Care  (ICD-V70.0) 13)  Plantar Fasciitis, Bilateral  (ICD-728.71) 14)  Foot Pain, Bilateral  (  ICD-729.5) 15)  Lumbar Radiculopathy, Right  (ICD-724.4) 16)  Cervical Radiculopathy  (ICD-723.4) 17)  Paroxysmal Nocturnal Dyspnea  (ICD-786.09) 18)  Foot Pain, Right  (ICD-729.5) 19)  Low Back Pain  (ICD-724.2) 20)  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 Take One Tablet  By Mouth As Needed 2)  Valium 5 Mg  Tabs (Diazepam) .... One By Mouth Three Times A Day As Needed 3)  Hydrocodone-Acetaminophen 7.5-500 Mg Tabs (Hydrocodone-Acetaminophen) .Marland Kitchen.. 1 Two Times A Day As Needed- 60 Must Last 30 Days 4)  Pristiq 50 Mg Xr24h-Tab (Desvenlafaxine Succinate) .Marland Kitchen.. 1 Tab Once Daily 5)  Aspirin 81 Mg Tbec (Aspirin) .... Take One Tablet By Mouth Daily. Pt Has Not Started 6)  Metoprolol Tartrate 25 Mg Tabs (Metoprolol Tartrate) .... Take One Tablet By Mouth Twice A Day .Anika Esther Has Not Started 7)  Pravastatin Sodium 40 Mg Tabs (Pravastatin Sodium) .... Take One Tablet By Mouth Daily At Bedtime .Devona Konig Has Not Started  Current Medications (verified): 1)  Xanax 0.5 Mg  Tabs (Alprazolam) .... Take One Tablet  By Mouth As Needed 2)  Valium 5 Mg  Tabs (Diazepam) .... One By Mouth Three Times A Day As Needed 3)  Hydrocodone-Acetaminophen 7.5-500 Mg Tabs (Hydrocodone-Acetaminophen) .Marland Kitchen.. 1 Two Times A Day As Needed- 60 Must Last 30 Days 4)  Pristiq 50 Mg Xr24h-Tab (Desvenlafaxine Succinate) .Marland Kitchen.. 1 Tab Once Daily 5)  Aspirin 81 Mg Tbec (Aspirin) .... Take One Tablet By Mouth Daily. Pt Has Not Started 6)  Metoprolol Tartrate 25 Mg Tabs (Metoprolol Tartrate) .... Take One Tablet By Mouth Twice A Day .Amariya Lochner Has Not Started 7)  Pravastatin Sodium 40 Mg Tabs (Pravastatin Sodium) .... Take One Tablet By Mouth Daily At Bedtime .Devona Konig Has Not Started  Allergies (verified): 1)  ! Sulfa 2)  ! Codeine  Past History:  Family History: Last updated: 11/21/2009 mother with dementia, deceased  father deceased,  CAD/MI died from bladder cancer 5 brothers and  1sister. 3 brothers are deceased. One died from esophageal cancer, one with aortic aneurysm. Paternal grandmother and paternal aunt died from MI  Social History: Last updated: 01/01/2010 Single 1 child Smoked 1/2 ppd for the last 10 years.  Alcohol use-yes, occasional No illicit drug use Worked as Interior and spatial designer. Currently disabled (fibromyalgia?)   Risk Factors: Smoking Status: current (01/19/2010) Passive Smoke Exposure: yes (01/19/2010)  Past medical, surgical, family and social histories (including risk factors) reviewed, and no changes noted (except as noted below).  Past Medical History: Reviewed history from 01/01/2010 and no changes required. IBS (ICD-564.1) STRICTURE, ESOPHAGEAL (ICD-530.3) s/p dilatation x 3. chronic back pain Depression Low back pain NECK PAIN Interstitial cystitis  Past Surgical History: Reviewed history from 11/21/2009 and no changes required. Hysterectomy-TAH EGD-05/30/2003 Lumbar surgery  Elsner Lumbar fusion Cervical fusion Cervical laminectomy Tonsillectomy and adenoidectomy Appendectomy Right finger surgery  Family History: Reviewed history from 11/21/2009 and no changes required. mother with dementia, deceased  father deceased,  CAD/MI died from bladder cancer 5 brothers and 1sister. 3 brothers are deceased. One  died from esophageal cancer, one with aortic aneurysm. Paternal grandmother and paternal aunt died from MI  Social History: Reviewed history from 01/01/2010 and no changes required. Single 1 child Smoked 1/2 ppd for the last 10 years.  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    Physical Exam  General:  alert and well-hydrated.   Head:  Normocephalic and atraumatic without obvious abnormalities. No apparent alopecia or balding. Eyes:  pupils equal and pupils round.   Nose:  no external deformity and no nasal discharge.   Mouth:  posterior lymphoid hypertrophy and postnasal drip.   Neck:  nuchal rigidity and decreased ROM.   Lungs:  normal respiratory effort and no wheezes.   Heart:  normal rate, regular rhythm, and no murmur.   Abdomen:  Bowel sounds positive,abdomen soft and non-tender without masses, organomegaly or hernias noted. Msk:  joint tenderness, joint swelling, lumbar lordosis, and SI joint tenderness.   Neurologic:  alert & oriented X3 and DTRs symmetrical and normal.     Impression & Recommendations:  Problem # 1:  INTERSTITIAL CYSTITIS (ICD-595.1) stable/ improved  Problem # 2:  ABNORMAL CV (STRESS) TEST (ICD-794.39) dicussion of he use of pravachol  Problem # 3:  DEPRESSION (ICD-311)  Her updated medication list for this problem includes:    Xanax 0.5 Mg Tabs (Alprazolam) .Marland Kitchen... Take one tablet  by mouth as needed    Valium 5 Mg Tabs (Diazepam) ..... One by mouth three times a day as needed    Pristiq 100 Mg Xr24h-tab (Desvenlafaxine succinate) ..... One by mouth daily  Discussed treatment options, including trial of antidpressant medication. Will refer to behavioral health. Follow-up call in in 24-48 hours and recheck in 2 weeks, sooner as needed. Patient agrees to call if any worsening of symptoms or thoughts of doing harm arise. Verified that the patient has no suicidal ideation at this time.   Problem # 4:  HYPERTENSION (ICD-401.9)  Her updated medication list for this problem  includes:    Metoprolol Tartrate 25 Mg Tabs (Metoprolol tartrate) .Marland Kitchen... Take 1/2 by mouth two times a day  BP today: 130/80 Prior BP: 122/78 (01/01/2010)  10 Yr Risk Heart Disease: N/A  Labs Reviewed: K+: 4.2 (11/21/2009) Creat: : 0.8 (11/21/2009)   Chol: 233 (10/25/2009)   HDL: 57.00 (10/25/2009)   TG: 147.0 (10/25/2009)  Problem # 5:  LOW BACK PAIN (ICD-724.2)  Her updated medication list for this problem includes:    Hydrocodone-acetaminophen 7.5-500 Mg Tabs (Hydrocodone-acetaminophen) ..... Oen by mouth three times a day (90 must last 30 days)    Aspirin 81 Mg Tbec (Aspirin) .Marland Kitchen... Take one tablet by mouth daily. pt has not started  Complete Medication List: 1)  Xanax 0.5 Mg Tabs (Alprazolam) .... Take one tablet  by mouth as needed 2)  Valium 5 Mg Tabs (Diazepam) .... One by mouth three times a day as needed 3)  Hydrocodone-acetaminophen 7.5-500 Mg Tabs (Hydrocodone-acetaminophen) .... Oen by mouth three times a day (90 must last 30 days) 4)  Pristiq 100 Mg Xr24h-tab (Desvenlafaxine succinate) .... One by mouth daily 5)  Aspirin 81 Mg Tbec (Aspirin) .... Take one tablet by mouth daily. pt has not started 6)  Metoprolol Tartrate 25 Mg Tabs (Metoprolol tartrate) .... Take 1/2 by mouth two times a day 7)  Pravastatin Sodium 40 Mg Tabs (Pravastatin sodium) .... Take one tablet by mouth daily at bedtime ...  Hypertension Assessment/Plan:      The patient's hypertensive risk group is category C: Target organ damage and/or diabetes.  Today's blood pressure is 130/80.  Her blood pressure goal is < 140/90.  Patient Instructions: 1)  need to take the pravochol and  may start with 1/2 of the metoprolol two times a day 2)  Please schedule a follow-up appointment in 3 months. Prescriptions: PRISTIQ 100 MG XR24H-TAB (DESVENLAFAXINE SUCCINATE) one by mouth daily  #30 x 4   Entered and Authorized by:   Stacie Glaze MD   Signed by:   Stacie Glaze MD on 01/19/2010   Method used:   Print  then Give to Patient   RxID:   [id number] HYDROCODONE-ACETAMINOPHEN 7.5-500 MG TABS (HYDROCODONE-ACETAMINOPHEN) oen by mouth three times a day (90 must last 30 days)  #90 x 3   Entered and Authorized by:   Stacie Glaze MD   Signed by:   Stacie Glaze MD on 01/19/2010   Method used:   Print then Give to Patient   RxID:   [id number]    Orders Added: 1)  Est. Patient Level IV [84696]

## 2010-03-15 NOTE — Assessment & Plan Note (Signed)
Summary: uti/bladder infection per bonnye//ccm   Vital Signs:  Patient profile:   55 year old female Height:      71 inches Weight:      175 pounds BMI:     24.50 O2 Sat:      98 % on Room air Temp:     98.3 degrees F oral Pulse rate:   75 / minute BP sitting:   122 / 78  (left arm) Cuff size:   regular  Vitals Entered By: Bill Salinas CMA (January 01, 2010 11:01 AM)  O2 Flow:  Room air CC: pt here with c/o urinary freq, lower abd pressure, burning with urination, back pain and  cold symptoms/ ab   History of Present Illness: 55 y/o WF here for about 1 month of : suprapubic pain, urinary frequency and urgency, intermittent dysuria, pain with intercourse, and pressure in lower abdomen.  Denies fever but says temp up for her--in 99 -99.5 range at times.  No n/v or change in bowel habits.   Denies any new vag d/c, denies vag itching.  Took a few courses of abx recently per her report, apparently b/c of mildly elevated WBC count. Review of labs over the last 2 mo shows WBC 15, then recheck 1 wk later 14, so flow cytometry was done and was normal.  WBC 1 mo later was 10.4.  Diff's, Hb, and plt counts have been normal. Says she has distant hx of interstitial cystitis, says dx was by biopsy by Dr. Logan Bores but she hasn't followed up in years.  Says the current symptoms she's having are consistent with past bouts of IC.  Also, mentions chronic swelling/fullness in right side of neck that is sometimes painful, sometimes tender to touch.  Has not had this imaged, but says it has been noted by MDs multiple times and has not changed any over the last few years.  Currently has a tooth hurting in right side of mouth but says in the past when not having teeth problems her right side of her neck was swollen like this.    ROS: a little hoarse lately, slight PND mucous lately.  Right sinus area pain on/off. +palp's (recently had w/u that revealed CAD--medical management).   Problems Prior to Update: 1)   Swelling, Neck  (ICD-784.2) 2)  Interstitial Cystitis  (ICD-595.1) 3)  Cad, Native Vessel  (ICD-414.01) 4)  Palpitations  (ICD-785.1) 5)  Abnormal Cv (STRESS) Test  (ICD-794.39) 6)  Chest Pain-precordial  (ICD-786.51) 7)  Chest Pain-precordial  (ICD-786.51) 8)  Palpitations  (ICD-785.1) 9)  Chest Pain  (ICD-786.50) 10)  Hypertension  (ICD-401.9) 11)  Electrocardiogram, Abnormal  (ICD-794.31) 12)  Preventive Health Care  (ICD-V70.0) 13)  Plantar Fasciitis, Bilateral  (ICD-728.71) 14)  Foot Pain, Bilateral  (ICD-729.5) 15)  Lumbar Radiculopathy, Right  (ICD-724.4) 16)  Cervical Radiculopathy  (ICD-723.4) 17)  Paroxysmal Nocturnal Dyspnea  (ICD-786.09) 18)  Foot Pain, Right  (ICD-729.5) 19)  Low Back Pain  (ICD-724.2) 20)  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) 21)  Degenerative Disc Disease, Cervical Spine, W/radiculopathy  (ICD-722.0) 22)  Tobacco Use  (ICD-305.1) 23)  Bronchitis, Acute With Mild Bronchospasm  (ICD-466.0) 24)  Osteoarthrosis, Local Nos, Lower Leg  (ICD-715.36) 25)  State, Symptomatic Menopause/fem Climacteric  (ICD-627.2) 26)  Depression  (ICD-311) 27)  Lumbar Radiculopathy, Left  (ICD-724.4) 28)  Ibs  (ICD-564.1) 29)  Stricture, Esophageal  (ICD-530.3)  Current Medications (verified): 1)  Xanax 0.5 Mg  Tabs (Alprazolam) .... Take One Tablet  By  Mouth As Needed 2)  Valium 5 Mg  Tabs (Diazepam) .... One By Mouth Three Times A Day As Needed 3)  Hydrocodone-Acetaminophen 7.5-500 Mg Tabs (Hydrocodone-Acetaminophen) .Marland Kitchen.. 1 Two Times A Day As Needed- 60 Must Last 30 Days 4)  Pristiq 50 Mg Xr24h-Tab (Desvenlafaxine Succinate) .Marland Kitchen.. 1 Tab Once Daily 5)  Aspirin 81 Mg Tbec (Aspirin) .... Take One Tablet By Mouth Daily. Pt Has Not Started 6)  Metoprolol Tartrate 25 Mg Tabs (Metoprolol Tartrate) .... Take One Tablet By Mouth Twice A Day .Merri Remsburg Has Not Started 7)  Pravastatin Sodium 40 Mg Tabs (Pravastatin Sodium) .... Take One Tablet By Mouth Daily At Bedtime .Devona Konig Has Not Started  Allergies (verified): 1)  ! Sulfa 2)  ! Codeine  Past History:  Past medical, surgical, family and social histories (including risk factors) reviewed for relevance to current acute and chronic problems.  Past Medical History: IBS (ICD-564.1) STRICTURE, ESOPHAGEAL (ICD-530.3) s/p dilatation x 3. chronic back pain Depression Low back pain NECK PAIN Interstitial cystitis  Past Surgical History: Reviewed history from 11/21/2009 and no changes required. Hysterectomy-TAH EGD-05/30/2003 Lumbar surgery  Elsner Lumbar fusion Cervical fusion Cervical laminectomy Tonsillectomy and adenoidectomy Appendectomy Right finger surgery  Family History: Reviewed history from 11/21/2009 and no changes required. mother with dementia, deceased  father deceased,  CAD/MI died from bladder cancer 5 brothers and 1sister. 3 brothers are deceased. One died from esophageal cancer, one with aortic aneurysm. Paternal grandmother and paternal aunt died from MI  Social History: Reviewed history from 11/21/2009 and no changes required. Single 1 child Smoked 1/2 ppd for the last 10 years.  Alcohol use-yes, occasional No illicit drug use Worked as Interior and spatial designer. Currently disabled (fibromyalgia?)   Review of Systems       see HPI  Physical Exam  General:  VS: noted, all normal. Gen: Alert, well appearing, oriented x 4. Neck: carotid on right 2+, left 1+.  No bruit. Mild TTP over carotid pulsation on right.  Subtle fullness to right submandibular region but no distinct mass palpable.  Oropharynx with no lesion, swelling, erythema, or exudate.  Dentition in decent repair.  Mild right maxillary TTP.  Nose without congestion or d/c.   Chest: symmetric expansion, with nonlabored respirations.  Clear and equal breath sounds in all lung fields.   CV: RRR, no m/r/g.  Peripheral pulses 2+/symmetric. ABD: soft, mild diffuse lower abdominal/suprapubic tenderness, ND, BS normal.  No  hepatospenomegaly or mass.  No bruits.  No mass.    Impression & Recommendations:  Problem # 1:  INTERSTITIAL CYSTITIS (ICD-595.1) Will get her  back into her urologist's. Urine was completely normal on UA today.  Problem # 2:  SWELLING, NECK (ICD-784.2) This is chronic, unchanged.  Discussed options today, including CT or U/S neck.  Pt okay with "watchful waiting" approach, as has been undertaken thus far.  Orders: Urology Referral (Urology)  Complete Medication List: 1)  Xanax 0.5 Mg Tabs (Alprazolam) .... Take one tablet  by mouth as needed 2)  Valium 5 Mg Tabs (Diazepam) .... One by mouth three times a day as needed 3)  Hydrocodone-acetaminophen 7.5-500 Mg Tabs (Hydrocodone-acetaminophen) .Marland Kitchen.. 1 two times a day as needed- 60 must last 30 days 4)  Pristiq 50 Mg Xr24h-tab (Desvenlafaxine succinate) .Marland Kitchen.. 1 tab once daily 5)  Aspirin 81 Mg Tbec (Aspirin) .... Take one tablet by mouth daily. pt has not started 6)  Metoprolol Tartrate 25 Mg Tabs (Metoprolol tartrate) .... Take one tablet by mouth twice a  day .liley rake has not started 7)  Pravastatin Sodium 40 Mg Tabs (Pravastatin sodium) .... Take one tablet by mouth daily at bedtime .Devona Konig has not started  Patient Instructions: 1)  Please schedule a follow-up appointment as needed .  2)  You will be contacted about an appointment with [facility].   Orders Added: 1)  Est. Patient Level IV [16109] 2)  Urology Referral [Urology]    Laboratory Results   Urine Tests   Date/Time Reported: Ami Bullins CMA  January 01, 2010 11:10 AM   Routine Urinalysis   Color: straw Appearance: Hazy Glucose: negative   (Normal Range: Negative) Bilirubin: negative   (Normal Range: Negative) Ketone: negative   (Normal Range: Negative) Spec. Gravity: 1.025   (Normal Range: 1.003-1.035) Blood: negative   (Normal Range: Negative) pH: 5.0   (Normal Range: 5.0-8.0) Protein: negative   (Normal Range:  Negative) Urobilinogen: 0.2   (Normal Range: 0-1) Nitrite: negative   (Normal Range: Negative) Leukocyte Esterace: negative   (Normal Range: Negative)

## 2010-03-15 NOTE — Telephone Encounter (Signed)
Pt is req to get a cortisone shot in her back. Pt said back pain in severe. Pls advise. Pt says that she has been taking Vicodin, but it has not helped.

## 2010-03-21 NOTE — Progress Notes (Signed)
Summary: REFILL REQUEST - DOSAGE INCREASE  Phone Note Refill Request Message from:  Patient on March 13, 2010 9:36 AM  Refills Requested: Medication #1:  XANAX 0.5 MG  TABS Take one tablet  by mouth as needed   Notes: [facility] [place].... wants dosage increased due to back pain and not being able to sleep at night.    Initial call taken by: Debbra Riding,  March 13, 2010 9:37 AM  Follow-up for Phone Call        no increase Follow-up by: Willy Eddy, LPN,  March 13, 2010 2:16 PM    Prescriptions: Prudy Feeler 0.5 MG  TABS (ALPRAZOLAM) Take one tablet  by mouth as needed  #90 x 0   Entered by:   Willy Eddy, LPN   Authorized by:   Stacie Glaze MD   Signed by:   Willy Eddy, LPN on 16/11/9602   Method used:   Telephoned to ...       [facility]  [phone]* (retail)       [address] [place], [place]  [address]       Ph: [phone] or [phone]       Fax: [phone]   RxID:   [id number]  no increase per dr Lovell Sheehan

## 2010-04-23 ENCOUNTER — Telehealth: Payer: Self-pay | Admitting: Internal Medicine

## 2010-04-23 NOTE — Telephone Encounter (Signed)
Pt's daught er informed- none available

## 2010-04-23 NOTE — Telephone Encounter (Signed)
Unable to contact- the only number we have has been disconnected

## 2010-04-23 NOTE — Telephone Encounter (Signed)
Need samples Pristique. She is out. Please return call.

## 2010-04-23 NOTE — Telephone Encounter (Signed)
Pt walked in wanting samples. Call her at [phone].

## 2010-04-26 LAB — POCT CARDIAC MARKERS: Troponin i, poc: <0.05 ng/mL (ref 0.00–0.09)

## 2010-04-26 LAB — POCT I-STAT, CHEM 8
Creatinine, Ser: 1 mg/dL (ref 0.4–1.2)
Hemoglobin: 14.6 g/dL (ref 12.0–15.0)
Potassium: 3.9 mEq/L (ref 3.5–5.1)
Sodium: 140 mEq/L (ref 135–145)

## 2010-05-11 ENCOUNTER — Encounter: Payer: Self-pay | Admitting: Internal Medicine

## 2010-05-14 ENCOUNTER — Encounter: Payer: Self-pay | Admitting: Internal Medicine

## 2010-05-14 ENCOUNTER — Ambulatory Visit (INDEPENDENT_AMBULATORY_CARE_PROVIDER_SITE_OTHER): Payer: Self-pay | Admitting: Internal Medicine

## 2010-05-14 VITALS — BP 130/80 | HR 76 | Temp 98.2°F | Resp 14 | Ht 71.0 in | Wt 174.0 lb

## 2010-05-14 DIAGNOSIS — I10 Essential (primary) hypertension: Secondary | ICD-10-CM

## 2010-05-14 DIAGNOSIS — I2510 Atherosclerotic heart disease of native coronary artery without angina pectoris: Secondary | ICD-10-CM

## 2010-05-14 DIAGNOSIS — F329 Major depressive disorder, single episode, unspecified: Secondary | ICD-10-CM

## 2010-05-14 MED ORDER — HYDROCODONE-ACETAMINOPHEN 7.5-500 MG PO TABS: 1.0000 | ORAL_TABLET | Freq: Three times a day (TID) | ORAL | Status: DC | PRN

## 2010-05-14 MED ORDER — CITALOPRAM HYDROBROMIDE 40 MG PO TABS: 40.0000 mg | ORAL_TABLET | Freq: Every day | ORAL | Status: DC

## 2010-05-14 MED ORDER — PRAVASTATIN SODIUM 40 MG PO TABS: 40.0000 mg | ORAL_TABLET | Freq: Every day | ORAL | Status: AC

## 2010-05-14 MED ORDER — BISOPROLOL-HYDROCHLOROTHIAZIDE 2.5-6.25 MG PO TABS: 1.0000 | ORAL_TABLET | Freq: Every day | ORAL | Status: DC

## 2010-05-14 MED ORDER — ALPRAZOLAM 0.5 MG PO TABS: 0.5000 mg | ORAL_TABLET | ORAL | Status: DC | PRN

## 2010-05-14 NOTE — Assessment & Plan Note (Signed)
She has not been on the Lopressor and we will replace this with ziac

## 2010-05-14 NOTE — Assessment & Plan Note (Signed)
She is unable to afford to pristiq She has done well on the Lexapro in the past and has taken samples of Lexapro and she states that she has felt okay we will use generic citalopram

## 2010-05-14 NOTE — Assessment & Plan Note (Signed)
She has not been taking the Pravachol the Pravachol is a $4 drug available at [facility] or [facility] we will send it to [facility]  on the [address]

## 2010-05-14 NOTE — Progress Notes (Signed)
  Subjective:    Patient ID: [name], female    DOB: 01-19-1956, 55 y.o.   MRN: [id number]  HPI Has not been taking the medication the cardiologist prescribed. She was dilated for chest pain and felt to have coronary disease she was placed on a statin for lowering her cholesterol and a beta blocker for control she was noncompliant with both of these medications.  She does take her anxiety medications and pain medications she continues to use tobacco. She is at high risk for that. We discussed her ongoing noncompliance and smoking has been high risk for cardiac event   Review of Systems  Constitutional: Negative for activity change, appetite change and fatigue.  HENT: Negative for ear pain, congestion, neck pain, postnasal drip and sinus pressure.   Eyes: Negative for redness and visual disturbance.  Respiratory: Negative for cough, shortness of breath and wheezing.   Gastrointestinal: Negative for abdominal pain and abdominal distention.  Genitourinary: Negative for dysuria, frequency and menstrual problem.  Musculoskeletal: Negative for myalgias, joint swelling and arthralgias.  Skin: Negative for rash and wound.  Neurological: Negative for dizziness, weakness and headaches.  Hematological: Negative for adenopathy. Does not bruise/bleed easily.  Psychiatric/Behavioral: Negative for sleep disturbance and decreased concentration.       Objective:   Physical Exam  Constitutional: She is oriented to person, place, and time. She appears well-developed and well-nourished. No distress.  HENT:  Head: Normocephalic and atraumatic.  Right Ear: External ear normal.  Left Ear: External ear normal.  Nose: Nose normal.  Mouth/Throat: Oropharynx is clear and moist.  Eyes: Conjunctivae and EOM are normal. Pupils are equal, round, and reactive to light.  Neck: Normal range of motion. Neck supple. No JVD present. No tracheal deviation present. No thyromegaly present.  Cardiovascular:  Normal rate, regular rhythm, normal heart sounds and intact distal pulses.   No murmur heard. Pulmonary/Chest: Effort normal and breath sounds normal. She has no wheezes. She exhibits no tenderness.  Abdominal: Soft. Bowel sounds are normal.  Musculoskeletal: Normal range of motion. She exhibits no edema and no tenderness.  Lymphadenopathy:    She has no cervical adenopathy.  Neurological: She is alert and oriented to person, place, and time. She has normal reflexes. No cranial nerve deficit.  Skin: Skin is warm and dry. She is not diaphoretic.  Psychiatric: She has a normal mood and affect. Her behavior is normal.          Assessment & Plan:  Compliance is the biggest issue with the patient she did not fill any of the medications the cardiologist recommended I have sent these medications to a portal pharmacy and have insisted that she begin the protocol as well as a beta blocker. She has followup planned with cardiology I have recommended that she keep this visit I have also recommended that she continue her efforts to cut back and quit smoking who felt her pain medications and anxiety medications for 3 months with a followup in 3 months

## 2010-05-25 ENCOUNTER — Other Ambulatory Visit: Payer: Self-pay | Admitting: *Deleted

## 2010-05-25 DIAGNOSIS — M5412 Radiculopathy, cervical region: Secondary | ICD-10-CM

## 2010-05-25 MED ORDER — DIAZEPAM 5 MG PO TABS: 5.0000 mg | ORAL_TABLET | Freq: Three times a day (TID) | ORAL | Status: DC | PRN

## 2010-06-22 ENCOUNTER — Telehealth: Payer: Self-pay | Admitting: *Deleted

## 2010-06-22 DIAGNOSIS — M5412 Radiculopathy, cervical region: Secondary | ICD-10-CM

## 2010-06-22 MED ORDER — DIAZEPAM 5 MG PO TABS: 5.0000 mg | ORAL_TABLET | Freq: Three times a day (TID) | ORAL | Status: DC | PRN

## 2010-06-22 NOTE — Telephone Encounter (Signed)
done

## 2010-06-26 NOTE — Op Note (Signed)
NAMELOGANN, WHITEBREAD             ACCOUNT NO.:  [id number]   MEDICAL RECORD NO.:  [id number]          PATIENT TYPE:  OIB   LOCATION:  3538                         FACILITY:  [facility]   PHYSICIAN:  Stefani Dama, M.D.  DATE OF BIRTH:  04-06-1955   DATE OF PROCEDURE:  DATE OF DISCHARGE:                               OPERATIVE REPORT   DATE OF OPERATION:  Jul 02, 2007   PREOPERATIVE DIAGNOSIS:  Cervical spondylosis with cervical  radiculopathy C5-C6.   POSTOPERATIVE DIAGNOSIS:  Cervical spondylosis with cervical  radiculopathy C5-C6.   PROCEDURE:  Anterior cervical decompression C5-C6 arthrodesis with  structural allograft Alphatec plate fixation (Trestle).   SURGEON:  Stefani Dama, M.D.   FIRST ASSISTANT:  None.   ANESTHESIA:  General endotracheal.   INDICATIONS:  Rifka Ramey is a 55 year old individual who has had  significant cervical radiculopathy involving the right upper extremity.  She had bouts of this in the past but now she has had unrelenting pain  and she has biforaminal stenosis secondary to significant slight  spondylitic disease at C5-C6.  She has been advised regarding surgery.   PROCEDURE:  The patient was brought to the operating room supine on the  stretcher.  After smooth induction of general endotracheal anesthesia,  she was placed in 5 pounds of halter traction.  The neck was prepped  with alcohol and DuraPrep and draped in sterile fashion.  Transverse  incision was made on the left side of the neck and this was carried down  through the platysmal plane between the sternocleidomastoid and strap  muscles were dissected bluntly until the prevertebral space was reached.  The first identifiable disk space was noted to be that of C5-C6 on  localizing radiograph.  The prevertebral tissues were then dissected and  the longus coli muscle was dissected off either side of midline just  above and below the disk space.  Self-retaining Caspar retractor was  placed deep into the wound.  Anterior longitudinal ligament was then  opened with #15 blade and a combination of Kerrison rongeurs was used to  evacuate a significant quantity of severely degenerated and desiccated  disk material.  The disk space was evacuated till the region of  posterior longitudinal ligament was reached and here a 2.3-mm dissecting  tool on high-speed bit was used to dissect away significant osteophytic  spur centrally with uncinate process hypertrophy out laterally that was  causing compression of the ventral aspect of the thecal sac and the  takeoffs of the C6 nerve roots.  The right side was decompressed first  as here there was a greater amount of osteophytic overgrowth from the  uncinate process.  This was drilled down completely smoothly until the  exit of the C6 nerve root was free and clear.  The left side was then  similarly decompressed.  Hemostasis in the epidural veins was obtained  with some bipolar cautery and some small pledgets of Gelfoam soaked in  thrombin which were later irrigated away.  With the decompression being  completed, the endplates were smoothed with a 5-mm barrel bit and then  an 8-mm  trans graft was shaped in size to the appropriate configuration  to fit within the disk space.  It was filled with local autograft and  some demineralized bone matrix, and the graft was then inserted and  countersunk in the disk space.  The traction was then removed.  Then, a  14-mm standard-size Alphatec Trestle plate was fitted to the ventral  aspect of the vertebral bodies.  Once the plate was secured the area was  copiously irrigated with antibiotic irrigating solution and hemostasis  in the soft tissues was obtained.  The platysma was closed with 3-0  Vicryl in interrupted fashion, 3-0 Vicryl was used in the subcuticular  tissues, and a final localizing radiograph identified good position of  the hardware.  The patient tolerated the procedure well.  Was  returned  to recovery room in stable condition.      Stefani Dama, M.D.  Electronically Signed     HJE/MEDQ  D:  07/02/2007  T:  07/03/2007  Job:  [id number]

## 2010-06-26 NOTE — Op Note (Signed)
Laurie [name], Laurie [name]             ACCOUNT NO.:  [id number]   MEDICAL RECORD NO.:  [id number]          PATIENT TYPE:  AMB   LOCATION:  DSC                          FACILITY:  [facility]   PHYSICIAN:  [name], MD    DATE OF BIRTH:  1956/02/08   DATE OF PROCEDURE:  07/24/2007  DATE OF DISCHARGE:                               OPERATIVE REPORT   PREOPERATIVE DIAGNOSIS:  Right shoulder adhesive capsulitis.   POSTOPERATIVE DIAGNOSIS:  Right shoulder adhesive capsulitis.   OPERATIVE PROCEDURE:  Right shoulder manipulation under anesthesia.   OPERATIVE FINDINGS:  Preoperatively, her external rotation was 10  degrees while asleep.  Forward flexion was to 90 degrees.  She had  significant adhesion formation.  Postoperatively, her external rotation  was to 70 degrees with the elbow at her side.  Forward flexion was 180  degrees.  Internal rotation was to 65 degrees with the arm at 90 degrees  and external rotation was to 110 degrees with the arm at 90 degrees.   PREOPERATIVE INDICATIONS:  [name] is a 54 year old woman  who had severe debilitating right shoulder pain and adhesive capsulitis.  She had a previous cervical diskectomy for the treatment of a herniated  cervical disk, but her shoulder got worse.  She elected to undergo the  above-named procedure.  The risks, benefits, and alternatives were  discussed with her preoperatively including but not limited to risks of  infection, bleeding, nerve injury, fracture, the need for future  arthroscopic surgical treatment of AC joint arthritis and rotator cuff  tendinopathy and impingement, cardiopulmonary complications, recurrent  shoulder stiffness, permanent dysfunction, among others and she is  willing to proceed.   OPERATIVE PROCEDURE:  The patient was brought to the operating room and  placed in a supine position.  General anesthesia was administered.  The  right upper extremity was manipulated with the above-named  achievements  and range of motion.  We were able to get excellent motion after the  manipulation and satisfactory release of the adhesions.  We then  performed a prep over the anterior glenohumeral joint using Betadine and  injected the joint with a combination of 80 mg of Depo-Medrol as well as  Marcaine.  The patient tolerated the procedure well.  There were no  complications.  She is going to plan to begin physical therapy  immediately.      [name], MD  Electronically Signed     JPL/MEDQ  D:  07/24/2007  T:  07/25/2007  Job:  [id number]

## 2010-06-29 NOTE — Op Note (Signed)
NAMECARLIS, Laurie [name]             ACCOUNT NO.:  [id number]   MEDICAL RECORD NO.:  [id number]          PATIENT TYPE:  AMB   LOCATION:  [facility]                         [facility]AC[facility]   PHYSICIAN:  Jamison Neighbor, M.D.  DATE OF BIRTH:  Aug 28, 1955   DATE OF PROCEDURE:  02/24/2004  DATE OF DISCHARGE:                                 OPERATIVE REPORT   PREOPERATIVE DIAGNOSIS:  1.  Chronic interstitial cystitis.  2.  Recurrent urinary tract infections.  3.  Microscopic hematuria.   POSTOPERATIVE DIAGNOSIS:  1.  Chronic interstitial cystitis.  2.  Recurrent urinary tract infections.  3.  Microscopic hematuria.   PROCEDURE:  Cystoscopy, urethral calibration, hydrodistention of the  bladder, bladder biopsy with cauterization, bilateral retrogrades, Marcaine  and Pyridium instillation, Marcaine and Kenalog injection.   SURGEON:  Jamison Neighbor, M.D.   ANESTHESIA:  General.   COMPLICATIONS:  None.   DRAINS:  None.   BRIEF HISTORY:  This 55 year old female is a longstanding patient of Dr.  Elige Radon.  She is felt to have interstitial cystitis.  The patient has had  recent problems with recurrent urinary tract infections on top of her  chronic IC.  The patient is now to undergo cystoscopy and retrograde studies  along with a hydrodistention to determine if there is any way to improve her  symptoms.  She understands the risks and benefits of the procedure and gave  full, informed consent.   PROCEDURE:  After the successful induction of general anesthesia, the  patient is placed in a dorsal lithotomy position, prepped with Betadine, and  draped in the usual sterile fashion.  Careful bimanual examination showed no  abnormality to the urethra and no significant cystocele, rectocele, or  enterocele.  The urethra was calibrated to a 41 [place] female urethral  sounds.  The cystoscope was inserted.  The bladder was carefully inspected.  It was free of any tumor or stones.  Both ureteral  orifices were normal in  configuration and location.  Bilateral retrogrades showed normal retraction.  No filling defect or obstruction seen.  The bladder was distended at a  pressure of 100 cm of water for five minutes, and the bladder capacity was  650 cc.  When the bladder was drained, glomerulations could be seen  throughout the bladder, but they are fairly modest.  No ulcers were seen.  No other abnormalities were detected.  A bladder biopsy was done, and the  biopsy site was cauterized.  A mixture of Marcaine and Pyridium was left in  the bladder.  Marcaine and Kenalog were injected periurethrally.  The  patient tolerated the procedure well and was taken  to the recovery room in good condition.  She received intraoperative Toradol  and Zofran as well as a B&O suppository.  She will be sent home with pain  medication, Pyridium Plus, and will be maintained on Macrodantin 1 daily.  She will return to see me in followup.      RJE/MEDQ  D:  02/24/2004  T:  02/24/2004  Job:  [id number]

## 2010-06-29 NOTE — Op Note (Signed)
[facility]  Patient:    Laurie [name], Laurie [name] Visit Number: [id number] MRN: [id number]          Service Type: NES Location: NESC Attending Physician:  Dalbert Mayotte Dictated by:   Radene Knee., M.D. Proc. Date: 06/15/01 Admit Date:  06/15/2001 Disc. Date: 06/08/01                             Operative Report  PREOPERATIVE DIAGNOSES: 1. Interstitial cystitis. 2. Recurrent urinary tract infections and chronic cystitis, onset 1992. 3. Degenerative lumbar spine disease. 4. History of uterine suspension and removal of cystic masses adjacent to the    uterus with hysterectomy in 1997 for endometriosis.  POSTOPERATIVE DIAGNOSES: 1. Interstitial cystitis. 2. Recurrent urinary tract infections and chronic cystitis, onset 1992. 3. Degenerative lumbar spine disease. 4. History of uterine suspension and removal of cystic masses adjacent to the    uterus with hysterectomy in 1997 for endometriosis.  OPERATION PERFORMED:  Cystoscopy, hydrodistention, and Clorpactin irrigation of bladder.  DESCRIPTION OF OPERATION:  This 55 year old female brought to the operating room, underwent successful induction of general anesthesia after administration of IV gentamicin 80 mg.  She was prepped and draped in the lithotomy position and the bladder inspected with a #22 cystourethroscope using 70 and 12 degree lenses.  Initially, there was only minimal hyperemia and erythema, and no stone or tumor was noted.  She had good estrogen effect on her trigone.  The right and left ureteral orifices were normal.  The examination under anesthesia had suggested a small nodule in the anterior vagina just adjacent to the right bladder neck, felt very much like a suspension pledget, but there was not a similar nodule on the left of the bladder neck.  It was not fixed to the urethra or the bladder.  But it did seemed to be fixed to the anterior vaginal wall.  The bladder  was then distended to capacity which was 550 mL and decompressed.  She developed severe hemorrhages throughout the bladder which were generally distributed and very severe with bleeding in innumerable areas.  The patients bladder was emptied, and her bladder was irrigated with 1000 cc of Clorpactin solution and emptied again, and she returned to recovery area in a stable condition.  The plan is for the patient to go home with Darvocet-N 100 q.6-8h. p.r.n. pain.  She is to use Cipro 500 b.i.d., return to the office as scheduled 1-2 weeks. Dictated by:   Radene Knee., M.D. Attending Physician:  Dalbert Mayotte DD:  06/15/01 TD:  06/15/01 Job: [id number]

## 2010-06-29 NOTE — H&P (Signed)
[place]. [facility]  Patient:    Laurie [name], Laurie [name] Visit Number: [id number] MRN: [id number]          Service Type: MED Location: 1800 1823 01 Attending Physician:  Lorre Nick Dictated by:   Doylene Canning. Ladona Ridgel, M.D. [facility] Admit Date:  02/23/2001   CC:         Stacie Glaze, M.D. [facility]   History and Physical  INDICATIONS FOR ADMISSION:  Evaluation of chest pain worrisome for acute coronary ischemia.  HISTORY OF PRESENT ILLNESS:  The patient is a very pleasant 55 year old young lady with a history of rheumatic fever as a child with recent chest pain evaluation demonstrating a borderline Cardiolite stress test.  At that time there was a small apical and lateral defect thought consistent with ischemia. She was in her usual state of health until this weekend when she noted chest pain radiating to her left arm and shoulder.  It was associated with weakness and fatigue and some shortness of breath.  It came off and on and continued to persist until today when she presented to Dr. Stacie Glaze office.  She is subsequently admitted for additional evaluation.  The patient states that her pain sometimes occurs with exertion and sometimes not.  There is associated shortness of breath.  There is no nausea or diaphoresis.  PAST MEDICAL HISTORY:  As previously noted.  In addition, there is a history of interstitial cystitis.  She is status post back surgery.  She has had a history of rheumatic fever as a teenager.  SOCIAL HISTORY:  [place]  She works as a Social worker.  She rarely smokes cigarettes and rarely uses ethanol.  FAMILY HISTORY:  Notable for coronary disease with bypass surgery.  CARDIAC RISK FACTORS:  Include family history.  She does not know her lipid status.  She does not have diabetes or hypertension as far as she knows.  REVIEW OF SYSTEMS:  Notable for no fevers, chills, night sweats, or adenopathy.   She denies any vision or hearing changes.  She denies any skin lesions.  She does have chest pain and shortness of breath as previously noted.  There are rare palpitations.  There is no orthopnea or edema.  There is no syncope.  There is no cough.  She does have dysuria, but denies nocturia or hematuria.  She does have generalized weakness, but otherwise no focal complaints.  She denies any arthritic symptoms.  She denies any nausea, vomiting, diarrhea, or constipation.  She does have occasional abdominal cramps.  Of note, she is status post hysterectomy.  Other review of systems is all negative.  PHYSICAL EXAMINATION:  She is a pleasant, well-appearing, young woman in no distress.  VITAL SIGNS:  The blood pressure is 137/66, pulse 68 and regular, and respirations 20.  HEENT:  Normocephalic and atraumatic.  Pupils equal and round.  The oropharynx is moist.  NECK:  No jugular venous distention.  CARDIOVASCULAR:  Regular rate and rhythm with normal S1 and S2.  I appreciated a very soft systolic murmur heard at the left upper sternal border.  LUNGS:  Clear bilaterally to auscultation.  ABDOMEN:  Soft, nontender, and nondistended.  There is no organomegaly.  EXTREMITIES:  No clubbing, cyanosis, or edema.  There were no rashes.  LABORATORY DATA:  The EKG demonstrates normal sinus rhythm with insignificant Q waves in lead 2, leads V5-6, and aVF.  There was lateral T-wave abnormality.  Other labs  are all negative to date.  IMPRESSION: 1. Chest pain with both typical and atypical features in a patient with very    few cardiac risk factors, but who has had abnormal stress Cardiolite in the    past. 2. History of very minimal tobacco use. 3. Remote history of rheumatic fever.  DISCUSSION:  At this point, will plan on obtaining a 2-D echocardiogram and cardiac enzymes.  Because she is back in the hospital with chest pain and abnormal Cardiolite, will plan on proceeding with heart  catheterization sooner if she becomes unstable or her enzymes are abnormal. Dictated by:   Doylene Canning. Ladona Ridgel, M.D. [facility] Attending Physician:  Lorre Nick DD:  02/23/01 TD:  02/23/01 Job: [id number]

## 2010-06-29 NOTE — H&P (Signed)
[facility]  Patient:    Laurie [name], FARINA Visit Number: [id number] MRN: [id number]          Service Type: Attending:  Radene Knee., M.D. Dictated by:   Radene Knee., M.D. Adm. Date:  06/04/01                           History and Physical  DATE OF BIRTH:  05/17/1955  CHIEF COMPLAINT:  This patient, age 55, is scheduled for cystoscopy, hydrodistention, and Clorpactin irrigation of the bladder at [facility] with a chief complaint of discomfort in her bladder associated with frequent urination.  HISTORY OF PRESENT ILLNESS:  This 55 year old female states that she has had difficulty with recurrent bladder infections dating back to 78.  She initially responded to antibiotics, but has more recently had increasing difficulty and was diagnosed with interstitial cystitis.  She had her last cystoscopy, hydrodistention, and Clorpactin carried out in March of 1998.  She had done reasonably well until December of 2002 when she came in with left lower quadrant discomfort associated with frequency every one to two hours, getting up twice a night.  This has failed to respond to antibiotics.  She has been treated mainly with azithromycin and cephalexin.  Her most recent urinalyses and urine culture in March of 2003 showed no growth.  The patient was advised to have cystoscopy, hydrodistention, and Clorpactin.  She has passed no blood, gravel, or stone.  ALLERGIES:  SULFA, TETRACYCLINE, MACRODANTIN, and CODEINE.  CURRENT MEDICATIONS:  Premarin and Prozac.  PAST SURGICAL HISTORY: 1. Tonsillectomy and adenoidectomy. 2. Appendectomy. 3. Cesarean section. 4. Hysterectomy in 1997.  FAMILY HISTORY:  Positive for carcinoma of the bladder in her father.  Her mother has COPD.  There is a family history of diabetes, stones, and heart disease, but not for bleeding problems or hypertension.  SOCIAL HISTORY:  The patient is separated.  She has been working  as a Social worker.  She has a daughter who is approximately 76-50 years old.  She does not abuse alcohol or tobacco.  REVIEW OF SYSTEMS:  General:  Health has been fair.  Weight has been stable. HEENT:  Unremarkable.  Cardiorespiratory:  She denies any chest pain, asthma, or heart attack.  GI:  She does have frequent constipation.  No jaundice, hepatitis, or stomach ulcers.  Bones, Joints, and Muscles:  She has had chronic low back pain.  Neuropsychiatric:  She has had decreased sensation in her legs and depression.  OB/GYN:  She has had a history of one child and one C-section.  Pap smears have been normal.  She has had five miscarriages, four tubal pregnancies, and a hysterectomy in 1997.  Skin and Lymphatics:  No lesions noted.  No nodes noted.  PHYSICAL EXAMINATION:  A well-developed, well-nourished, 55 year old female.  VITAL SIGNS:  Temperature 98.0 degrees, respirations 12, pulse 60, blood pressure 130/82.  HEENT:  The ears and tympanic membranes are unremarkable.  The eyes react normally to light and accommodation.  Extraocular movements intact.  Pharynx benign.  Teeth in good repair.  NECK:  No enlargement of thyroid.  No nodes palpable.  CHEST:  Clear to auscultation and percussion.  HEART:  Normal sinus rhythm.  No murmur detected.  BREASTS:  Not examined.  ABDOMEN:  Mild obesity.  Liver, spleen, kidneys, masses, and hernia not detected.  There is slight suprapubic tenderness.  GENITALIA:  The external genitalia and meatus are normal.  The urethra is free of masses.  The bladder is tender.  The rectal tone is good.  No rectal masses are noted.  EXTREMITIES:  No edema.  Good peripheral pulses.  NEUROLOGIC:  Grossly normal reflexes and sensation.  IMPRESSION: 1. Interstitial cystitis. 2. Recurrent urinary tract infections, onset in 1992. 3. Degenerative lumbosacral spine disease. 4. Hysterectomy in 1997 for endometriosis.  PLAN:  Cystoscopy, hydrodistention,  and Clorpactin irrigation of the bladder. Dictated by:   Radene Knee., M.D. Attending:  Radene Knee., M.D. DD:  06/04/01 TD:  06/04/01 Job: [id number]

## 2010-06-29 NOTE — Discharge Summary (Signed)
[place]. [facility]  Patient:    Laurie [name], Laurie [name] Visit Number: [id number] MRN: [id number]          Service Type: MED Location: 2000 2003 01 Attending Physician:  Carrie Mew Dictated by:   Cornell Barman, P.A. Admit Date:  02/23/2001 Discharge Date: 02/26/2001   CC:         Barbette Hair. Arlyce Dice, M.D. [facility]             Stacie Glaze, M.D. [facility]                           Discharge Summary  DISCHARGE DIAGNOSES: 1. Chest pain, myocardial infarction ruled out. 2. Chronic abdominal pain. 3. Chronic constipation.  HISTORY OF PRESENT ILLNESS:  Laurie [name] is a patient who was evaluated for chest pain.  Apparently, she had a borderline Cardiolite stress test.  There was a small apical and lateral defect consistent with ischemia.  The patient had been in her usual state of health until the weekend of admission when she developed chest pain radiating to the left arm and shoulder.  The patient was admitted for further evaluation.  PAST MEDICAL HISTORY: 1. Interstitial cystitis. 2. Chronic abdominal pain. 3. Constipation. 4. Remote back surgery. 5. History of rheumatic fever.  HOSPITAL COURSE: #1 - CHEST PAIN:  The patient was admitted to rule out myocardial infarction. Cardiac enzymes were negative.  EKG was without ischemia.  We did have cardiology evaluate the patient, who recommended continuing her nitroglycerin and heparin drip.  They recommended obtaining a 2-D echo and proceeding with a cardiac catheterization.  The patients 2-D echo was normal, revealing normal LV systolic function and no wall motion abnormalities.  Cardiac catheterization was performed by Dr. Antoine Poche on February 25, 2001, and she was noted to have normal coronaries.  Her EF was 65%.  No further cardiac intervention was pursued.  #2 - HYPERCHOLESTEROLEMIA:  The patients total cholesterol was elevated, and her LDL was also elevated.  At this time no lipid-lowering agents have  been initiated.  We will defer to her primary care physician.  #3 - ABDOMINAL PAIN:  The patient complains of chronic abdominal pain associated with significant constipation.  Again, this is not a new problem for the patient.  She states that she has been seeing Dr. Lovell Sheehan most recently regarding her pain.  She has also seen her gynecologist, who recommended a transvaginal ultrasound.  That had been scheduled but was cancelled due to inclement weather.  The patient has not rescheduled that ultrasound.  The patient states that she has seen Dr. Arlyce Dice in the remote past and has both a colonoscopy and endoscopy greater than five years ago. The patient did, of note, have a CT scan of her abdomen February 25, 2000. This was unremarkable and showed evidence of a prior hysterectomy.  The patients abdominal pain appears to be functional, and we have scheduled her for an outpatient follow-up evaluation with Dr. Arlyce Dice.  DISCHARGE MEDICATIONS:  Will have her resume her home medications. Additionally, the patient will be discharged on: 1. Zocor 20 mg q.d. 2. Protonix 40 mg q.d.  FOLLOW-UP:  With Dr. Lovell Sheehan in two to three weeks and Dr. Arlyce Dice on Friday, March 06, 2001, at 11:15 a.m. Dictated by:   Cornell Barman, P.A. Attending Physician:  Carrie Mew DD:  02/26/01 TD:  02/28/01 Job: [id number]

## 2010-06-29 NOTE — Cardiovascular Report (Signed)
[place]. [facility]  Patient:    Laurie [name], Laurie [name] Visit Number: [id number] MRN: [id number]          Service Type: MED Location: 2000 2003 01 Attending Physician:  Carrie Mew Dictated by:   Rollene Rotunda, M.D. [facility] Proc. Date: 02/25/01 Admit Date:  02/23/2001   CC:         Della Goo, M.D.   Cardiac Catheterization  DATE OF BIRTH:  [birth year].  PRIMARY CARE PHYSICIAN:  Stacie Glaze, M.D.  PROCEDURE:  Left heart catheterization/coronary arteriography.  INDICATIONS:  Evaluate patient with chest pain suggestive of unstable angina and a Cardiolite demonstrating possible apical ischemia.  PROCEDURAL NOTE:  Left heart catheterization was performed via the right femoral artery.  The artery was cannulated using an anterior wall puncture.  A #6 French arterial sheath was inserted via the modified Seldinger technique. Preformed Judkins and a pigtail catheter were utilized.  The patient tolerated the procedure well and left the lab in stable condition.  RESULTS:  Hemodynamics:  LV 153/17.  AO 154/74.  Coronaries: 1. The left main was normal. 2. The LAD was normal. 3. The circumflex was normal. 4. The right coronary artery was a large dominant vessel and normal.  Left ventriculogram:  The left ventriculogram was obtained in the RAO projection.  The EF was 65% with normal wall motion.  CONCLUSIONS:  Normal coronary arteries.  Normal left ventricular function.  PLAN:  No further cardiac work-up is planned.  The patient will have evaluation of nonanginal chest discomfort if her episodes continue. Dictated by:   Rollene Rotunda, M.D. [facility] Attending Physician:  Carrie Mew DD:  02/25/01 TD:  02/25/01 Job: [id number]

## 2010-07-02 ENCOUNTER — Telehealth: Payer: Self-pay | Admitting: *Deleted

## 2010-07-02 MED ORDER — AZITHROMYCIN 250 MG PO TABS: 250.0000 mg | ORAL_TABLET | Freq: Every day | ORAL | Status: AC

## 2010-07-02 NOTE — Telephone Encounter (Signed)
May have z pack per dr jenkins 

## 2010-07-02 NOTE — Telephone Encounter (Signed)
z pack ordered by dr Lovell Sheehan called in-to [facility] [address]- unable to contact pt- no number was given and the number we have was given to another person 2 days ago.

## 2010-07-02 NOTE — Telephone Encounter (Signed)
patient  Is calling because she would like a z-pak called in for her sinuses.  She has sore throat, productive cough, and sinus headache

## 2010-08-16 ENCOUNTER — Other Ambulatory Visit: Payer: Self-pay | Admitting: *Deleted

## 2010-08-16 MED ORDER — HYDROCODONE-ACETAMINOPHEN 7.5-500 MG PO TABS: 1.0000 | ORAL_TABLET | Freq: Three times a day (TID) | ORAL | Status: DC | PRN

## 2010-08-20 ENCOUNTER — Telehealth: Payer: Self-pay | Admitting: *Deleted

## 2010-08-20 DIAGNOSIS — M5412 Radiculopathy, cervical region: Secondary | ICD-10-CM

## 2010-08-20 MED ORDER — DIAZEPAM 5 MG PO TABS: 5.0000 mg | ORAL_TABLET | Freq: Three times a day (TID) | ORAL | Status: DC | PRN

## 2010-08-20 NOTE — Telephone Encounter (Signed)
ok 

## 2010-08-29 ENCOUNTER — Telehealth: Payer: Self-pay | Admitting: Internal Medicine

## 2010-08-29 NOTE — Telephone Encounter (Signed)
Ref[facility] at [facility].

## 2010-08-30 ENCOUNTER — Other Ambulatory Visit: Payer: Self-pay | Admitting: *Deleted

## 2010-08-30 ENCOUNTER — Telehealth: Payer: Self-pay | Admitting: Internal Medicine

## 2010-08-30 DIAGNOSIS — F329 Major depressive disorder, single episode, unspecified: Secondary | ICD-10-CM

## 2010-08-30 MED ORDER — ALPRAZOLAM 0.5 MG PO TABS: 0.5000 mg | ORAL_TABLET | ORAL | Status: DC | PRN

## 2010-08-30 NOTE — Telephone Encounter (Signed)
Called --#30 is correct

## 2010-08-30 NOTE — Telephone Encounter (Signed)
Received a fax and voicemail for xanax refill. One for #30 and one for #90. Needs clarification on quantity.

## 2010-10-01 ENCOUNTER — Telehealth: Payer: Self-pay | Admitting: Internal Medicine

## 2010-10-01 NOTE — Telephone Encounter (Signed)
[facility] [address]

## 2010-10-01 NOTE — Telephone Encounter (Signed)
Would like a refill on her Valium. She is requesting a 30 day supply, instead of ten.

## 2010-10-02 ENCOUNTER — Other Ambulatory Visit: Payer: Self-pay | Admitting: *Deleted

## 2010-10-02 DIAGNOSIS — F329 Major depressive disorder, single episode, unspecified: Secondary | ICD-10-CM

## 2010-10-02 MED ORDER — ALPRAZOLAM 0.5 MG PO TABS: 0.5000 mg | ORAL_TABLET | Freq: Every day | ORAL | Status: DC | PRN

## 2010-10-19 ENCOUNTER — Telehealth: Payer: Self-pay | Admitting: Internal Medicine

## 2010-10-19 DIAGNOSIS — M5412 Radiculopathy, cervical region: Secondary | ICD-10-CM

## 2010-10-19 MED ORDER — DIAZEPAM 5 MG PO TABS: 5.0000 mg | ORAL_TABLET | Freq: Three times a day (TID) | ORAL | Status: DC | PRN

## 2010-10-19 NOTE — Telephone Encounter (Signed)
May refill 

## 2010-10-19 NOTE — Telephone Encounter (Signed)
Requesting refill on Diazepam 5MG  tablets 90 with 1 refill. Last filled 09/17/10. SIG: Take 1 tablet by mouth 3 times a day as needed  [facility]  [address] [place] [address]  Phone [phone] Fax [phone]

## 2010-10-23 ENCOUNTER — Other Ambulatory Visit: Payer: Self-pay

## 2010-10-23 DIAGNOSIS — F329 Major depressive disorder, single episode, unspecified: Secondary | ICD-10-CM

## 2010-10-23 MED ORDER — ALPRAZOLAM 0.5 MG PO TABS: 0.5000 mg | ORAL_TABLET | Freq: Every day | ORAL | Status: DC | PRN

## 2010-10-23 NOTE — Telephone Encounter (Signed)
May refill x3

## 2010-10-23 NOTE — Telephone Encounter (Signed)
Rx request for alprazolam 0.5 mg; take 1 tab tid.  Last OV 05/14/10. Pls advise.

## 2010-10-23 NOTE — Telephone Encounter (Signed)
rx has been called into pharmacy.

## 2010-11-07 LAB — BASIC METABOLIC PANEL
BUN: 19
Chloride: 101
GFR calc Af Amer: >60
Potassium: 4.6
Sodium: 140

## 2010-11-07 LAB — CBC
HCT: 43
Hemoglobin: 14.8
MCV: 89.9
RBC: 4.78
WBC: 12.7 — ABNORMAL HIGH

## 2010-11-20 ENCOUNTER — Ambulatory Visit (INDEPENDENT_AMBULATORY_CARE_PROVIDER_SITE_OTHER): Payer: PRIVATE HEALTH INSURANCE | Admitting: Internal Medicine

## 2010-11-20 VITALS — BP 140/80 | HR 76 | Temp 98.2°F | Resp 16 | Ht 70.5 in | Wt 176.0 lb

## 2010-11-20 DIAGNOSIS — F329 Major depressive disorder, single episode, unspecified: Secondary | ICD-10-CM

## 2010-11-20 MED ORDER — HYDROCODONE-ACETAMINOPHEN 7.5-500 MG PO TABS: 1.0000 | ORAL_TABLET | Freq: Three times a day (TID) | ORAL | Status: DC | PRN

## 2010-11-20 MED ORDER — BUPROPION HCL ER (XL) 150 MG PO TB24: 150.0000 mg | ORAL_TABLET | Freq: Every day | ORAL | Status: DC

## 2010-11-20 NOTE — Patient Instructions (Signed)
For the   Black cohosh   Twice a day for the menopausal symptoms

## 2010-11-20 NOTE — Progress Notes (Signed)
  Subjective:    Patient ID: Laurie [name], female    DOB: 10-29-1955, 55 y.o.   MRN: [id number]  HPI Still smoking Was irritable with the celexa Mood still poor with tearfull and difficulty motivating Took Wellbutrin in the past Shoulder pain increased and following an intermit ant pattern   Review of Systems  Eyes: Negative.   Respiratory: Negative.   Cardiovascular: Negative.   Gastrointestinal: Negative.   Neurological: Positive for weakness and headaches.       Objective:   Physical Exam  Constitutional: She appears well-developed and well-nourished.  Eyes: Conjunctivae normal are normal. Pupils are equal, round, and reactive to light.  Cardiovascular: Normal rate and regular rhythm.   Pulmonary/Chest: Effort normal and breath sounds normal.          Assessment & Plan:  Wellbutrin trial for depression

## 2010-11-26 ENCOUNTER — Telehealth: Payer: Self-pay | Admitting: *Deleted

## 2010-11-26 NOTE — Telephone Encounter (Signed)
Pharmacy calling requesting early refill of Diazepam 5 mg. #90.

## 2010-11-26 NOTE — Telephone Encounter (Signed)
Requesting early refill of valium -last received #90 on 10-2-pharmacist informed it may be refilled on 11-2 and not any earlier-she also takes xanax

## 2010-12-12 ENCOUNTER — Other Ambulatory Visit: Payer: Self-pay | Admitting: *Deleted

## 2010-12-12 DIAGNOSIS — F329 Major depressive disorder, single episode, unspecified: Secondary | ICD-10-CM

## 2010-12-12 DIAGNOSIS — M5412 Radiculopathy, cervical region: Secondary | ICD-10-CM

## 2010-12-12 MED ORDER — ALPRAZOLAM 0.5 MG PO TABS: 0.5000 mg | ORAL_TABLET | Freq: Every day | ORAL | Status: DC | PRN

## 2010-12-12 MED ORDER — DIAZEPAM 5 MG PO TABS: 5.0000 mg | ORAL_TABLET | Freq: Three times a day (TID) | ORAL | Status: DC | PRN

## 2011-01-07 ENCOUNTER — Telehealth: Payer: Self-pay | Admitting: Family Medicine

## 2011-01-07 NOTE — Telephone Encounter (Signed)
Sent in #90 but m,ust last 3 months- unable to contact pt and let her k now she has not appointment-

## 2011-01-07 NOTE — Telephone Encounter (Signed)
Pulled from Triage vmail. Pt states her Xanax was called in for only 30 days. Wants to know if she can get a 90-day Rx, because it is cheaper. Also states she "cannot take" Wellbutrin, and so she "stopped taking it". On the vmail she says she has a cpx scheduled for December, but there is nothing scheduled.

## 2011-01-28 ENCOUNTER — Telehealth: Payer: Self-pay | Admitting: Internal Medicine

## 2011-01-28 NOTE — Telephone Encounter (Signed)
Pt requesting refills on all 5 of her medications pt stated that some of her refills are up for refill on x-mas day  [facility] - [place], [place] - [address]

## 2011-01-30 ENCOUNTER — Other Ambulatory Visit: Payer: Self-pay | Admitting: *Deleted

## 2011-01-30 NOTE — Telephone Encounter (Signed)
[facility] and all medications are too soon to be refilled- we went over all meds--number we have for pt is wrong number- . Talked with pharmacy again and told them to let her know it is too soon for refills on anything if she calls for refills

## 2011-01-30 NOTE — Telephone Encounter (Signed)
Pt requesting a 90 day supply for  HYDROcodone-acetaminophen (LORTAB) 7.5-500 MG per tablet and diazepam (VALIUM) 5 MG tablet  But is requesting refills on all her meds. Pt also requesting to be contacted

## 2011-01-30 NOTE — Telephone Encounter (Signed)
[facility]- no refills are due until after christmas and xanax is not due until 2-26-unable to contact pt- number is wrong number-talked with pharmacy again and asked them to tell her it is to early for refills if sh e calls

## 2011-02-04 ENCOUNTER — Telehealth: Payer: Self-pay | Admitting: Internal Medicine

## 2011-02-04 DIAGNOSIS — M5412 Radiculopathy, cervical region: Secondary | ICD-10-CM

## 2011-02-04 MED ORDER — DIAZEPAM 5 MG PO TABS: 5.0000 mg | ORAL_TABLET | Freq: Three times a day (TID) | ORAL | Status: DC | PRN

## 2011-02-04 NOTE — Telephone Encounter (Signed)
Pt requesting refill on diazepam (VALIUM) 5 MG tablet

## 2011-02-07 ENCOUNTER — Other Ambulatory Visit: Payer: Self-pay | Admitting: Internal Medicine

## 2011-02-07 MED ORDER — HYDROCODONE-ACETAMINOPHEN 7.5-500 MG PO TABS: 1.0000 | ORAL_TABLET | Freq: Three times a day (TID) | ORAL | Status: DC | PRN

## 2011-02-07 NOTE — Telephone Encounter (Signed)
Pt called req refill of HYDROcodone-acetaminophen (LORTAB) 7.5-500 MG per tablet [facility] [address].

## 2011-03-06 ENCOUNTER — Other Ambulatory Visit: Payer: Self-pay | Admitting: *Deleted

## 2011-03-06 DIAGNOSIS — M5412 Radiculopathy, cervical region: Secondary | ICD-10-CM

## 2011-03-06 MED ORDER — DIAZEPAM 5 MG PO TABS: 5.0000 mg | ORAL_TABLET | Freq: Three times a day (TID) | ORAL | Status: DC | PRN

## 2011-03-12 ENCOUNTER — Encounter: Payer: Self-pay | Admitting: *Deleted

## 2011-03-12 ENCOUNTER — Ambulatory Visit: Payer: PRIVATE HEALTH INSURANCE | Admitting: Cardiovascular Disease

## 2011-03-15 ENCOUNTER — Other Ambulatory Visit (INDEPENDENT_AMBULATORY_CARE_PROVIDER_SITE_OTHER): Payer: PRIVATE HEALTH INSURANCE

## 2011-03-15 DIAGNOSIS — Z0000 Encounter for general adult medical examination without abnormal findings: Secondary | ICD-10-CM

## 2011-03-15 LAB — POCT URINALYSIS DIPSTICK
Bilirubin, UA: NEGATIVE
Glucose, UA: NEGATIVE
Nitrite, UA: NEGATIVE
Spec Grav, UA: 1.025
pH, UA: 5.5

## 2011-03-15 LAB — BASIC METABOLIC PANEL
BUN: 13 mg/dL (ref 6–23)
Chloride: 106 mEq/L (ref 96–112)
Creatinine, Ser: 0.8 mg/dL (ref 0.4–1.2)
Glucose, Bld: 100 mg/dL — ABNORMAL HIGH (ref 70–99)
Potassium: 3.8 mEq/L (ref 3.5–5.1)

## 2011-03-15 LAB — CBC WITH DIFFERENTIAL/PLATELET
Basophils Relative: 0.3 % (ref 0.0–3.0)
Eosinophils Absolute: 0.4 10*3/uL (ref 0.0–0.7)
Eosinophils Relative: 4.1 % (ref 0.0–5.0)
Lymphocytes Relative: 21.9 % (ref 12.0–46.0)
Neutrophils Relative %: 67.6 % (ref 43.0–77.0)
Platelets: 262 10*3/uL (ref 150.0–400.0)
RBC: 4.58 Mil/uL (ref 3.87–5.11)
WBC: 10.6 10*3/uL — ABNORMAL HIGH (ref 4.5–10.5)

## 2011-03-15 LAB — HEPATIC FUNCTION PANEL
ALT: 18 U/L (ref 0–35)
Alkaline Phosphatase: 67 U/L (ref 39–117)
Bilirubin, Direct: 0 mg/dL (ref 0.0–0.3)
Total Bilirubin: 0.6 mg/dL (ref 0.3–1.2)
Total Protein: 7.2 g/dL (ref 6.0–8.3)

## 2011-03-15 LAB — LIPID PANEL
HDL: 34.4 mg/dL — ABNORMAL LOW (ref >39.00)
Triglycerides: 219 mg/dL — ABNORMAL HIGH (ref 0.0–149.0)
VLDL: 43.8 mg/dL — ABNORMAL HIGH (ref 0.0–40.0)

## 2011-03-15 LAB — TSH: TSH: 1.92 u[IU]/mL (ref 0.35–5.50)

## 2011-03-22 ENCOUNTER — Ambulatory Visit (INDEPENDENT_AMBULATORY_CARE_PROVIDER_SITE_OTHER): Payer: PRIVATE HEALTH INSURANCE | Admitting: Internal Medicine

## 2011-03-22 ENCOUNTER — Encounter: Payer: Self-pay | Admitting: Internal Medicine

## 2011-03-22 VITALS — BP 146/90 | HR 72 | Temp 97.9°F | Resp 16 | Ht 70.5 in | Wt 178.0 lb

## 2011-03-22 DIAGNOSIS — E785 Hyperlipidemia, unspecified: Secondary | ICD-10-CM

## 2011-03-22 DIAGNOSIS — F329 Major depressive disorder, single episode, unspecified: Secondary | ICD-10-CM

## 2011-03-22 DIAGNOSIS — Z0000 Encounter for general adult medical examination without abnormal findings: Secondary | ICD-10-CM

## 2011-03-22 MED ORDER — ROSUVASTATIN CALCIUM 20 MG PO TABS: 20.0000 mg | ORAL_TABLET | ORAL | Status: DC

## 2011-03-22 MED ORDER — DESVENLAFAXINE SUCCINATE ER 50 MG PO TB24: 50.0000 mg | ORAL_TABLET | Freq: Every day | ORAL | Status: DC

## 2011-03-22 NOTE — Progress Notes (Signed)
Subjective:    Patient ID: Laurie [name], female    DOB: November 29, 1955, 56 y.o.   MRN: [id number]  HPI patient is a 56 her white female who presents for a complete physical examination.  She is followed for depression hypertension history of CAD and recently a severe grief reaction because of the death of her sibling.  She had resumed Wellbutrin but could not tolerate the medication she now has resumed smoking as anxious depressed.  She also has chronic back pain.      Review of Systems  Constitutional: Negative for activity change, appetite change and fatigue.  HENT: Positive for congestion and ear discharge. Negative for ear pain, neck pain, postnasal drip and sinus pressure.   Eyes: Negative for redness and visual disturbance.  Respiratory: Negative for cough, shortness of breath and wheezing.   Gastrointestinal: Negative for abdominal pain and abdominal distention.  Genitourinary: Negative for dysuria, frequency and menstrual problem.  Musculoskeletal: Positive for myalgias, back pain, joint swelling and arthralgias.  Skin: Negative for rash and wound.  Neurological: Negative for dizziness, weakness and headaches.  Hematological: Negative for adenopathy. Does not bruise/bleed easily.  Psychiatric/Behavioral: Positive for dysphoric mood and agitation. Negative for sleep disturbance and decreased concentration.   Past Medical History  Diagnosis Date  . IBS (irritable bowel syndrome)   . Esophageal stricture   . Chronic back pain   . Depression   . Interstitial cystitis     History   Social History  . Marital Status: Single    Spouse Name: N/A    Number of Children: N/A  . Years of Education: N/A   Occupational History  . Not on file.   Social History Main Topics  . Smoking status: Current Everyday Smoker -- 1.0 packs/day    Types: Cigarettes  . Smokeless tobacco: Never Used  . Alcohol Use: No  . Drug Use: No  . Sexually Active: Not on file   Other Topics  Concern  . Not on file   Social History Narrative  . No narrative on file    Past Surgical History  Procedure Date  . Abdominal hysterectomy   . Appendectomy   . Tonsillectomy   . Finger surgery   . Cervical laminectomy   . Cervical fusion   . Lumbar fusion   . Esophagogastroduodenoscopy     Family History  Problem Relation Age of Onset  . Dementia Mother   . Heart attack Father   . Coronary artery disease Father   . Cancer Brother     esophageal  . Coronary artery disease Paternal Aunt   . Coronary artery disease Paternal Grandmother   . Aneurysm Brother     aortic    Allergies  Allergen Reactions  . Codeine     REACTION: Vomiting  . Sulfonamide Derivatives     REACTION: Upset GI    Current Outpatient Prescriptions on File Prior to Visit  Medication Sig Dispense Refill  . ALPRAZolam (XANAX) 0.5 MG tablet Take 1 tablet (0.5 mg total) by mouth daily as needed.  30 tablet  3  . aspirin 81 MG tablet Take 81 mg by mouth daily.        . diazepam (VALIUM) 5 MG tablet Take 1 tablet (5 mg total) by mouth 3 (three) times daily as needed.  90 tablet  0  . HYDROcodone-acetaminophen (LORTAB) 7.5-500 MG per tablet Take 1 tablet by mouth every 8 (eight) hours as needed (must last 20 days-no early refill).  60 tablet  3  . bisoprolol-hydrochlorothiazide (ZIAC) 2.5-6.25 MG per tablet Take 1 tablet by mouth daily.  30 tablet  11    BP 146/90  Pulse 72  Temp 97.9 F (36.6 C)  Resp 16  Ht 5' 10.5" (1.791 m)  Wt 178 lb (80.74 kg)  BMI 25.18 kg/m2       Objective:   Physical Exam  Nursing note and vitals reviewed. Constitutional: She is oriented to person, place, and time. She appears well-developed and well-nourished. No distress.  HENT:  Head: Normocephalic and atraumatic.  Right Ear: External ear normal.  Left Ear: External ear normal.  Nose: Nose normal.  Mouth/Throat: Oropharynx is clear and moist.  Eyes: Conjunctivae and EOM are normal. Pupils are equal, round,  and reactive to light.  Neck: Normal range of motion. Neck supple. No JVD present. No tracheal deviation present. No thyromegaly present.  Cardiovascular: Normal rate, regular rhythm, normal heart sounds and intact distal pulses.   No murmur heard. Pulmonary/Chest: Effort normal and breath sounds normal. She has no wheezes. She exhibits no tenderness.  Abdominal: Soft. Bowel sounds are normal.  Musculoskeletal: Normal range of motion. She exhibits no edema and no tenderness.  Lymphadenopathy:    She has no cervical adenopathy.  Neurological: She is alert and oriented to person, place, and time. She has normal reflexes. No cranial nerve deficit.  Skin: Skin is warm and dry. She is not diaphoretic.  Psychiatric: She has a normal mood and affect. Her behavior is normal.       depressed          Assessment & Plan:   This is a routine physical examination for this healthy  Female. Reviewed all health maintenance protocols including mammography colonoscopy bone density and reviewed appropriate screening labs. Her immunization history was reviewed as well as her current medications and allergies refills of her chronic medications were given and the plan for yearly health maintenance was discussed all orders and referrals were made as appropriate.  We will begin a trial of pristiq 150 mg tablet by mouth daily should try pristiq in the past but was on her formulary is now on her formulary and thus affordable she had a very positive effect to the pristiq in the past and we will continue this medication as the problem with followup in 7 weeks.  Recheck of her blood pressure was 140/88.  We discussed her cholesterol would begin therapy with Crestor 20 mg on Mondays and Fridays and pulse therapy and recheck her lipids

## 2011-03-22 NOTE — Patient Instructions (Signed)
The patient is instructed to continue all medications as prescribed. Schedule followup with check out clerk upon leaving the clinic  

## 2011-04-01 ENCOUNTER — Other Ambulatory Visit: Payer: Self-pay | Admitting: *Deleted

## 2011-04-01 DIAGNOSIS — M5412 Radiculopathy, cervical region: Secondary | ICD-10-CM

## 2011-04-08 ENCOUNTER — Telehealth: Payer: Self-pay | Admitting: Internal Medicine

## 2011-04-08 DIAGNOSIS — F329 Major depressive disorder, single episode, unspecified: Secondary | ICD-10-CM

## 2011-04-08 MED ORDER — ALPRAZOLAM 0.5 MG PO TABS: 0.5000 mg | ORAL_TABLET | Freq: Every day | ORAL | Status: DC | PRN

## 2011-04-08 NOTE — Telephone Encounter (Signed)
ALPRAZolam (XANAX) 0.5 MG tablet - needs refill called in wants 3 month supply because it's cheaper.

## 2011-04-30 ENCOUNTER — Telehealth: Payer: Self-pay | Admitting: Internal Medicine

## 2011-04-30 MED ORDER — HYDROCODONE-ACETAMINOPHEN 7.5-500 MG PO TABS: 1.0000 | ORAL_TABLET | Freq: Three times a day (TID) | ORAL | Status: DC | PRN

## 2011-04-30 NOTE — Telephone Encounter (Signed)
Refilled

## 2011-04-30 NOTE — Telephone Encounter (Signed)
Patient called requesting a refill on her hydrocodone. Please assist.

## 2011-05-02 ENCOUNTER — Other Ambulatory Visit: Payer: Self-pay | Admitting: *Deleted

## 2011-05-02 DIAGNOSIS — M5412 Radiculopathy, cervical region: Secondary | ICD-10-CM

## 2011-05-02 MED ORDER — DIAZEPAM 5 MG PO TABS: 5.0000 mg | ORAL_TABLET | Freq: Three times a day (TID) | ORAL | Status: DC | PRN

## 2011-05-15 ENCOUNTER — Encounter: Payer: Self-pay | Admitting: Internal Medicine

## 2011-05-15 ENCOUNTER — Ambulatory Visit (INDEPENDENT_AMBULATORY_CARE_PROVIDER_SITE_OTHER): Payer: PRIVATE HEALTH INSURANCE | Admitting: Internal Medicine

## 2011-05-15 VITALS — BP 140/80 | HR 72 | Temp 98.6°F | Resp 16 | Ht 70.5 in | Wt 179.0 lb

## 2011-05-15 DIAGNOSIS — M19029 Primary osteoarthritis, unspecified elbow: Secondary | ICD-10-CM

## 2011-05-15 DIAGNOSIS — I10 Essential (primary) hypertension: Secondary | ICD-10-CM

## 2011-05-15 DIAGNOSIS — M25511 Pain in right shoulder: Secondary | ICD-10-CM

## 2011-05-15 DIAGNOSIS — F329 Major depressive disorder, single episode, unspecified: Secondary | ICD-10-CM

## 2011-05-15 DIAGNOSIS — M7500 Adhesive capsulitis of unspecified shoulder: Secondary | ICD-10-CM

## 2011-05-15 MED ORDER — VENLAFAXINE HCL ER 150 MG PO CP24: 150.0000 mg | ORAL_CAPSULE | Freq: Every day | ORAL | Status: DC

## 2011-05-15 MED ORDER — BISOPROLOL-HYDROCHLOROTHIAZIDE 2.5-6.25 MG PO TABS: 1.0000 | ORAL_TABLET | Freq: Every day | ORAL | Status: DC

## 2011-05-15 MED ORDER — METHYLPREDNISOLONE ACETATE 40 MG/ML IJ SUSP: 40.0000 mg | Freq: Once | INTRAMUSCULAR | Status: DC

## 2011-05-15 NOTE — Patient Instructions (Signed)
The patient is instructed to continue all medications as prescribed. Schedule followup with check out clerk upon leaving the clinic  

## 2011-05-15 NOTE — Progress Notes (Signed)
Subjective:    Patient ID: Laurie [name], female    DOB: [birth year], 56 y.o.   MRN: [id number]  HPI Patient is a 56 year old female who is seen for followup of hypertension hyperlipidemia back and shoulder pain.  Her acute complaint today is shoulder pain in her left shoulder she states that she has limited range of motion and increased pain when she lies on her shoulder consistent with bursitis.  Patient also has a history of hypertension and was evaluated for chest pain by cardiology with a stress test and followup echocardiogram she was instructed to discontinue smoking faithfully take her lipid-lowering agents and continue her beta blocker she has stopped all those medications.     Review of Systems  Constitutional: Negative for activity change, appetite change and fatigue.  HENT: Negative for ear pain, congestion, neck pain, postnasal drip and sinus pressure.   Eyes: Negative for redness and visual disturbance.  Respiratory: Negative for cough, shortness of breath and wheezing.   Gastrointestinal: Negative for abdominal pain and abdominal distention.  Genitourinary: Negative for dysuria, frequency and menstrual problem.  Musculoskeletal: Negative for myalgias, joint swelling and arthralgias.  Skin: Negative for rash and wound.  Neurological: Negative for dizziness, weakness and headaches.  Hematological: Negative for adenopathy. Does not bruise/bleed easily.  Psychiatric/Behavioral: Negative for sleep disturbance and decreased concentration.   Past Medical History  Diagnosis Date  . IBS (irritable bowel syndrome)   . Esophageal stricture   . Chronic back pain   . Depression   . Interstitial cystitis     History   Social History  . Marital Status: Single    Spouse Name: N/A    Number of Children: N/A  . Years of Education: N/A   Occupational History  . Not on file.   Social History Main Topics  . Smoking status: Current Everyday Smoker -- 1.0 packs/day   Types: Cigarettes  . Smokeless tobacco: Never Used  . Alcohol Use: No  . Drug Use: No  . Sexually Active: Not on file   Other Topics Concern  . Not on file   Social History Narrative  . No narrative on file    Past Surgical History  Procedure Date  . Abdominal hysterectomy   . Appendectomy   . Tonsillectomy   . Finger surgery   . Cervical laminectomy   . Cervical fusion   . Lumbar fusion   . Esophagogastroduodenoscopy     Family History  Problem Relation Age of Onset  . Dementia Mother   . Heart attack Father   . Coronary artery disease Father   . Cancer Brother     esophageal  . Coronary artery disease Paternal Aunt   . Coronary artery disease Paternal Grandmother   . Aneurysm Brother     aortic    Allergies  Allergen Reactions  . Codeine     REACTION: Vomiting  . Sulfonamide Derivatives     REACTION: Upset GI    Current Outpatient Prescriptions on File Prior to Visit  Medication Sig Dispense Refill  . ALPRAZolam (XANAX) 0.5 MG tablet Take 1 tablet (0.5 mg total) by mouth daily as needed.  90 tablet  0  . aspirin 81 MG tablet Take 81 mg by mouth daily.        . diazepam (VALIUM) 5 MG tablet Take 1 tablet (5 mg total) by mouth 3 (three) times daily as needed.  90 tablet  3  . HYDROcodone-acetaminophen (LORTAB) 7.5-500 MG per tablet Take 1 tablet by  mouth every 8 (eight) hours as needed (must last 20 days-no early refill).  60 tablet  3  . rosuvastatin (CRESTOR) 20 MG tablet Take 1 tablet (20 mg total) by mouth 2 (two) times a week.  90 tablet  3  . venlafaxine (EFFEXOR XR) 150 MG 24 hr capsule Take 1 capsule (150 mg total) by mouth daily.  30 capsule  6    BP 140/80  Pulse 72  Temp 98.6 F (37 C)  Resp 16  Ht 5' 10.5" (1.791 m)  Wt 179 lb (81.194 kg)  BMI 25.32 kg/m2       Objective:   Physical Exam  Nursing note and vitals reviewed. Constitutional: She is oriented to person, place, and time. She appears well-developed and well-nourished. No  distress.  HENT:  Head: Normocephalic and atraumatic.  Right Ear: External ear normal.  Left Ear: External ear normal.  Nose: Nose normal.  Mouth/Throat: Oropharynx is clear and moist.  Eyes: Conjunctivae and EOM are normal. Pupils are equal, round, and reactive to light.  Neck: Normal range of motion. Neck supple. No JVD present. No tracheal deviation present. No thyromegaly present.  Cardiovascular: Normal rate, regular rhythm, normal heart sounds and intact distal pulses.   No murmur heard. Pulmonary/Chest: Effort normal and breath sounds normal. She has no wheezes. She exhibits no tenderness.  Abdominal: Soft. Bowel sounds are normal.  Musculoskeletal: Normal range of motion. She exhibits no edema and no tenderness.  Lymphadenopathy:    She has no cervical adenopathy.  Neurological: She is alert and oriented to person, place, and time. She has normal reflexes. No cranial nerve deficit.  Skin: Skin is warm and dry. She is not diaphoretic.  Psychiatric: She has a normal mood and affect. Her behavior is normal.          Assessment & Plan:  We emphasized the need to reduce her cardiovascular risk factors but compliance with the 3 steps of her planned which include 1 control of her blood pressure with a beta blocker to control her cholesterol with a staint and 3 smoking cessation. We have sent her blood pressure medication and her statin and pharmacy and we believe that she will be compliant with this we have urged her to continue smoking cessation plans.  We have also identified that she cannot afford the pristiq and she needs an alternative for depression we will use Effexor 150 mg to transition her to an antidepressant that she can afford.  She will followup in 3 months time    Informed consent obtained and the patient's left shoulder was prepped with betadine. Local anesthesia was obtained with topical spray. Then 40 mg of Depo-Medrol and 1/2 cc of lidocaine was injected into the  joint space. The patient tolerated the procedure without complications. Post injection care discussed with patient.

## 2011-07-16 ENCOUNTER — Other Ambulatory Visit: Payer: Self-pay | Admitting: *Deleted

## 2011-07-16 MED ORDER — HYDROCODONE-ACETAMINOPHEN 7.5-500 MG PO TABS: 1.0000 | ORAL_TABLET | Freq: Three times a day (TID) | ORAL | Status: DC | PRN

## 2011-07-26 ENCOUNTER — Other Ambulatory Visit: Payer: Self-pay | Admitting: *Deleted

## 2011-07-26 DIAGNOSIS — F329 Major depressive disorder, single episode, unspecified: Secondary | ICD-10-CM

## 2011-07-26 MED ORDER — ALPRAZOLAM 0.5 MG PO TABS: 0.5000 mg | ORAL_TABLET | Freq: Every day | ORAL | Status: DC | PRN

## 2011-07-31 ENCOUNTER — Telehealth: Payer: Self-pay | Admitting: Internal Medicine

## 2011-07-31 NOTE — Telephone Encounter (Signed)
Pt left her vicodin at home. Pt needs new rx sent to [facility] [phone]

## 2011-07-31 NOTE — Telephone Encounter (Signed)
Per dr Lovell Sheehan- we will not do that-- it is in our controlled substance contract- we dont call that in

## 2011-08-05 ENCOUNTER — Other Ambulatory Visit: Payer: Self-pay | Admitting: *Deleted

## 2011-08-05 MED ORDER — HYDROCODONE-ACETAMINOPHEN 7.5-500 MG PO TABS: 1.0000 | ORAL_TABLET | Freq: Three times a day (TID) | ORAL | Status: DC | PRN

## 2011-08-14 ENCOUNTER — Ambulatory Visit (INDEPENDENT_AMBULATORY_CARE_PROVIDER_SITE_OTHER): Payer: PRIVATE HEALTH INSURANCE | Admitting: Internal Medicine

## 2011-08-14 ENCOUNTER — Encounter: Payer: Self-pay | Admitting: Internal Medicine

## 2011-08-14 VITALS — BP 140/80 | HR 72 | Temp 98.3°F | Resp 16 | Ht 70.5 in | Wt 180.0 lb

## 2011-08-14 DIAGNOSIS — T887XXA Unspecified adverse effect of drug or medicament, initial encounter: Secondary | ICD-10-CM

## 2011-08-14 DIAGNOSIS — M7500 Adhesive capsulitis of unspecified shoulder: Secondary | ICD-10-CM

## 2011-08-14 DIAGNOSIS — M5412 Radiculopathy, cervical region: Secondary | ICD-10-CM

## 2011-08-14 DIAGNOSIS — F329 Major depressive disorder, single episode, unspecified: Secondary | ICD-10-CM

## 2011-08-14 DIAGNOSIS — F17200 Nicotine dependence, unspecified, uncomplicated: Secondary | ICD-10-CM

## 2011-08-14 DIAGNOSIS — I10 Essential (primary) hypertension: Secondary | ICD-10-CM

## 2011-08-14 LAB — BASIC METABOLIC PANEL
BUN: 15 mg/dL (ref 6–23)
Calcium: 9.1 mg/dL (ref 8.4–10.5)
Creatinine, Ser: 0.8 mg/dL (ref 0.4–1.2)
GFR: 79.8 mL/min (ref >60.00)
Glucose, Bld: 106 mg/dL — ABNORMAL HIGH (ref 70–99)

## 2011-08-14 LAB — CBC WITH DIFFERENTIAL/PLATELET
Basophils Absolute: 0 10*3/uL (ref 0.0–0.1)
Eosinophils Relative: 3 % (ref 0.0–5.0)
HCT: 39.6 % (ref 36.0–46.0)
Lymphs Abs: 1.9 10*3/uL (ref 0.7–4.0)
Monocytes Relative: 6.8 % (ref 3.0–12.0)
Neutrophils Relative %: 69.5 % (ref 43.0–77.0)
Platelets: 270 10*3/uL (ref 150.0–400.0)
RDW: 14 % (ref 11.5–14.6)
WBC: 9.4 10*3/uL (ref 4.5–10.5)

## 2011-08-14 LAB — HEPATIC FUNCTION PANEL
ALT: 28 U/L (ref 0–35)
AST: 21 U/L (ref 0–37)
Alkaline Phosphatase: 66 U/L (ref 39–117)
Bilirubin, Direct: 0 mg/dL (ref 0.0–0.3)
Total Bilirubin: 0.5 mg/dL (ref 0.3–1.2)
Total Protein: 7.5 g/dL (ref 6.0–8.3)

## 2011-08-14 MED ORDER — MORPHINE SULFATE ER 30 MG PO TBCR: 30.0000 mg | EXTENDED_RELEASE_TABLET | Freq: Two times a day (BID) | ORAL | Status: DC

## 2011-08-14 NOTE — Progress Notes (Signed)
  Subjective:    Patient ID: Laurie [name], female    DOB: Apr 03, 1955, 56 y.o.   MRN: [id number]  HPI Increased pain in neck, right shoulder and knees Has noted some increased sweating.  No chest pain Last injection in shoulder 3 months Hx of cervical surgery with [facility] in 2010    Review of Systems  Constitutional: Negative for activity change, appetite change and fatigue.  HENT: Negative for ear pain, congestion, neck pain, postnasal drip and sinus pressure.   Eyes: Negative for redness and visual disturbance.  Respiratory: Negative for cough, shortness of breath and wheezing.   Gastrointestinal: Negative for abdominal pain and abdominal distention.  Genitourinary: Negative for dysuria, frequency and menstrual problem.  Musculoskeletal: Negative for myalgias, joint swelling and arthralgias.  Skin: Negative for rash and wound.  Neurological: Negative for dizziness, weakness and headaches.  Hematological: Negative for adenopathy. Does not bruise/bleed easily.  Psychiatric/Behavioral: Negative for disturbed wake/sleep cycle and decreased concentration.       Objective:   Physical Exam  Nursing note and vitals reviewed. Constitutional: She is oriented to person, place, and time. She appears well-developed and well-nourished. No distress.  HENT:  Head: Normocephalic and atraumatic.  Right Ear: External ear normal.  Left Ear: External ear normal.  Nose: Nose normal.  Mouth/Throat: Oropharynx is clear and moist.  Eyes: Conjunctivae and EOM are normal. Pupils are equal, round, and reactive to light.  Neck: Normal range of motion. Neck supple. No JVD present. No tracheal deviation present. No thyromegaly present.  Cardiovascular: Normal rate, regular rhythm, normal heart sounds and intact distal pulses.   No murmur heard. Pulmonary/Chest: Effort normal and breath sounds normal. She has no wheezes. She exhibits no tenderness.  Abdominal: Soft. Bowel sounds are normal.    Musculoskeletal: She exhibits edema and tenderness.  Lymphadenopathy:    She has no cervical adenopathy.  Neurological: She is alert and oriented to person, place, and time. She has normal reflexes. No cranial nerve deficit.  Skin: Skin is warm and dry. She is not diaphoretic.  Psychiatric: She has a normal mood and affect. Her behavior is normal.          Assessment & Plan:  Smoking cessation discussed Increased pain in joint with high risk of dependency monitoring sed rate, CBC and Increased CV risks with smoking, HTN and lipids  For better pain control were given go to a long-acting morphine derivative we will use MS Contin 30 mg by mouth twice a day and monitor use she'll be given 3 prescriptions one for now 1 and 30 days and one in 60 days will followup in 3 months  Pain control contract

## 2011-08-20 ENCOUNTER — Other Ambulatory Visit: Payer: Self-pay | Admitting: *Deleted

## 2011-08-20 DIAGNOSIS — M5412 Radiculopathy, cervical region: Secondary | ICD-10-CM

## 2011-08-20 MED ORDER — DIAZEPAM 5 MG PO TABS: 5.0000 mg | ORAL_TABLET | Freq: Three times a day (TID) | ORAL | Status: DC | PRN

## 2011-10-28 ENCOUNTER — Telehealth: Payer: Self-pay | Admitting: Internal Medicine

## 2011-10-28 NOTE — Telephone Encounter (Signed)
Pt in formed too early for any refills- received #3 morphine refills that will last until October 3- can try taking less often the morphine until they are completed

## 2011-10-28 NOTE — Telephone Encounter (Signed)
Patient called stating that she would like a refill of the hydrocodone as the morphine is too strong. Please advise/assist.

## 2011-11-04 ENCOUNTER — Encounter: Payer: Self-pay | Admitting: Family Medicine

## 2011-11-04 ENCOUNTER — Telehealth: Payer: Self-pay | Admitting: Internal Medicine

## 2011-11-04 ENCOUNTER — Ambulatory Visit (INDEPENDENT_AMBULATORY_CARE_PROVIDER_SITE_OTHER): Payer: PRIVATE HEALTH INSURANCE | Admitting: Family Medicine

## 2011-11-04 VITALS — BP 120/90 | Temp 98.2°F | Wt 180.0 lb

## 2011-11-04 DIAGNOSIS — F17200 Nicotine dependence, unspecified, uncomplicated: Secondary | ICD-10-CM

## 2011-11-04 DIAGNOSIS — S39012A Strain of muscle, fascia and tendon of lower back, initial encounter: Secondary | ICD-10-CM

## 2011-11-04 DIAGNOSIS — R300 Dysuria: Secondary | ICD-10-CM

## 2011-11-04 DIAGNOSIS — IMO0002 Reserved for concepts with insufficient information to code with codable children: Secondary | ICD-10-CM

## 2011-11-04 DIAGNOSIS — J069 Acute upper respiratory infection, unspecified: Secondary | ICD-10-CM

## 2011-11-04 LAB — URINALYSIS, ROUTINE W REFLEX MICROSCOPIC
Bilirubin Urine: NEGATIVE
Hgb urine dipstick: NEGATIVE
Leukocytes, UA: NEGATIVE
Nitrite: NEGATIVE
pH: 6.5 (ref 5.0–8.0)

## 2011-11-04 NOTE — Telephone Encounter (Signed)
Called and spoke with pt.  Advised pt that Dr. Selena Batten had sent a message to [facility] about pt's hydrocodone.  Pt aware Dr. Lovell Sheehan is only in the office on 11/07/11. Pt advised that she will be called when the message has been reviewed.

## 2011-11-04 NOTE — Progress Notes (Signed)
Chief Complaint  Patient presents with  . Back Injury    on yesterday     HPI:  Laurie [name], usually followed by Dr. Lovell Sheehan, is here for an acute visit for low back pain. -hurt back cleaning a commode yesterday when standing up -hx of disc surgery and DDD  -pain is severe soreness in low back bilat worse iwht sitting for a long time and certain movements -has tried her morphine for this, but it did not help and she doesn't like the way it makes her feel -last time she took the morphine was last week - she wants hydrocodone 7.5 -denies: weakness in legs, paresthesias, fall, bowel or bladder incontinence (changed from baseline) -hx of fibromyalgia  ?UTI/URI: -has IC, but has done better recenlty -symptoms: burning with urination and urinary frequency -denies: fevers, chills - is having an upper resp infection with nasal congestion, cough, drainage, body aches -has taken some OTC medications - ROS: See pertinent positives and negatives per HPI.  Past Medical History  Diagnosis Date  . IBS (irritable bowel syndrome)   . Esophageal stricture   . Chronic back pain   . Depression   . Interstitial cystitis     Family History  Problem Relation Age of Onset  . Dementia Mother   . Heart attack Father   . Coronary artery disease Father   . Cancer Brother     esophageal  . Coronary artery disease Paternal Aunt   . Coronary artery disease Paternal Grandmother   . Aneurysm Brother     aortic    History   Social History  . Marital Status: Single    Spouse Name: N/A    Number of Children: N/A  . Years of Education: N/A   Social History Main Topics  . Smoking status: Current Every Day Smoker -- 1.0 packs/day    Types: Cigarettes  . Smokeless tobacco: Never Used  . Alcohol Use: No  . Drug Use: No  . Sexually Active: None   Other Topics Concern  . None   Social History Narrative  . None    Current outpatient prescriptions:ALPRAZolam (XANAX) 0.5 MG tablet, Take 1  tablet (0.5 mg total) by mouth daily as needed., Disp: 90 tablet, Rfl: 0;  aspirin 81 MG tablet, Take 81 mg by mouth daily.  , Disp: , Rfl: ;  bisoprolol-hydrochlorothiazide (ZIAC) 2.5-6.25 MG per tablet, Take 1 tablet by mouth daily., Disp: 30 tablet, Rfl: 11 diazepam (VALIUM) 5 MG tablet, Take 1 tablet (5 mg total) by mouth 3 (three) times daily as needed., Disp: 90 tablet, Rfl: 3;  morphine (MS CONTIN) 30 MG 12 hr tablet, Take 1 tablet (30 mg total) by mouth 2 (two) times daily., Disp: 60 tablet, Rfl: 0;  rosuvastatin (CRESTOR) 20 MG tablet, Take 1 tablet (20 mg total) by mouth 2 (two) times a week., Disp: 90 tablet, Rfl: 3 venlafaxine (EFFEXOR XR) 150 MG 24 hr capsule, Take 1 capsule (150 mg total) by mouth daily., Disp: 30 capsule, Rfl: 6 Current facility-administered medications:methylPREDNISolone acetate (DEPO-MEDROL) injection 40 mg, 40 mg, Intra-articular, Once, Stacie Glaze, MD  [facility] Vitals:   11/04/11 1051  BP: 120/90  Temp: 98.2 F (36.8 C)    There is no height on file to calculate BMI.  GENERAL: vitals reviewed and listed below, alert, oriented, appears well hydrated and in no acute distress  HEENT: atraumatic, conjucntiva clear, no obvious abnormalities on inspection of external nose and ears, ear canals and TMs normal, clear  rhinorrhea, mild post oropharyngeal erythema and PND  NECK: no masses on inspection  LUNGS: clear to auscultation bilaterally, no wheezes, rales or rhonchi, good air movement  CV: HRRR, no peripheral edema  MS: moves all extremities without noticeable abnormality -gait normal -TTP R T7- L3 tension and TTP -L lumbar paraspinal muscle TTP and tenstion -no bony TTP, no PSIS or pirifomis TTP -+ facet loading R , neg left -neg tendelenburg -SLR and CLRT bilat -NV intact bilat in LEs - though poor effort throughout on strength testing   PSYCH: pleasant and cooperative, no obvious depression or anxiety  ASSESSMENT AND PLAN:  Discussed  the following assessment and plan:  1. Dysuria  Urinalysis with Reflex Microscopic, Culture, Urine - see patient instructions below  2. Viral upper respiratory infection  Patient instructions and return precautions below  3. Back strain  Hx and exam c/w back strain. Pt has normal strength in legs from evaluation of gait, but when I examined her on the table she gave very poor effort in all muscle strength testing in lower ext bilat which worries me that she may be exaggerating symptoms. She did ask for short acting narcotic pain meds as she reports she doesn't like the way the MS contin, which her PCP switched her to from Vicodin, works for her pain. I advised that I will need to defer to her PCP for her pain medications for her safety and will send a note to her PCP. I advised that these medications sometimes are not useful for chronic pain and have many side effects. She last took the MS contin about 1 week ago and reports sparing use of it prior. No withdrawal symptoms.Otherwise, recs per patient instructions below with return precautions.  4. TOBACCO USE  Advised smoking cessation and discussed that smoking does prolong viral illness and may aggravate bladder symptoms.    Orders Placed This Encounter  Procedures  . Culture, Urine  . Urinalysis with Reflex Microscopic    Patient Instructions  Back Pain: -advised will let her PCP change narcotics Rx if he feels that is needed -advised to use narcotics sparingly, use tylenol or ibuprofen as directed for pain (do not use tylenol if taking vicodin) -heat or ice as needed -keep moving and do gentle stretching as tolerated  Urine symptoms: -We have ordered labs for your at this visit. It usually takes 1-2 weeks for these to result and be processed. We will contact you with instructions if your results are abnormal. Normal results will be release to your [facility] in 1-2 weeks. If you have not heard from [place] or can not find your results in [facility] in  2 weeks please contact our office. -drink lots of water  Upper Respiratory Infection, Adult   An upper respiratory infection (URI) is also sometimes known as the common cold. The upper respiratory tract includes the nose, sinuses, throat, trachea, and bronchi. Bronchi are the airways leading to the lungs. Most people improve within 1 week, but symptoms can last up to 2 weeks. A residual cough may last even longer.   -can use sinex nasal spray for three day (not longer) -nasal saline three time daily -tylenol or ibuprofen as needed -plenty of rest and fluids  CAUSES Many different viruses can infect the tissues lining the upper respiratory tract. The tissues become irritated and inflamed and often become very moist. Mucus production is also common. A cold is contagious. You can easily spread the virus to others by oral contact. This includes  kissing, sharing a glass, coughing, or sneezing. Touching your mouth or nose and then touching a surface, which is then touched by another person, can also spread the virus. SYMPTOMS  Symptoms typically develop 1 to 3 days after you come in contact with a cold virus. Symptoms vary from person to person. They may include:  Runny nose.   Sneezing.   Nasal congestion.   Sinus irritation.   Sore throat.   Loss of voice (laryngitis).   Cough.   Fatigue.   Muscle aches.   Loss of appetite.   Headache.   Low-grade fever.  DIAGNOSIS  You might diagnose your own cold based on familiar symptoms, since most people get a cold 2 to 3 times a year. Your caregiver can confirm this based on your exam. Most importantly, your caregiver can check that your symptoms are not due to another disease such as strep throat, sinusitis, pneumonia, asthma, or epiglottitis. Blood tests, throat tests, and X-rays are not necessary to diagnose a common cold, but they may sometimes be helpful in excluding other more serious diseases. Your caregiver will decide if any  further tests are required. RISKS AND COMPLICATIONS  You may be at risk for a more severe case of the common cold if you smoke cigarettes, have chronic heart disease (such as heart failure) or lung disease (such as asthma), or if you have a weakened immune system. The very young and very old are also at risk for more serious infections. Bacterial sinusitis, middle ear infections, and bacterial pneumonia can complicate the common cold. The common cold can worsen asthma and chronic obstructive pulmonary disease (COPD). Sometimes, these complications can require emergency medical care and may be life-threatening. PREVENTION  The best way to protect against getting a cold is to practice good hygiene. Avoid oral or hand contact with people with cold symptoms. Wash your hands often if contact occurs. There is no clear evidence that vitamin C, vitamin E, echinacea, or exercise reduces the chance of developing a cold. However, it is always recommended to get plenty of rest and practice good nutrition. TREATMENT  Treatment is directed at relieving symptoms. There is no cure. Antibiotics are not effective, because the infection is caused by a virus, not by bacteria. Treatment may include:  Increased fluid intake. Sports drinks offer valuable electrolytes, sugars, and fluids.   Breathing heated mist or steam (vaporizer or shower).   Eating chicken soup or other clear broths, and maintaining good nutrition.   Getting plenty of rest.   Using gargles or lozenges for comfort.   Controlling fevers with ibuprofen or acetaminophen as directed by your caregiver.   Increasing usage of your inhaler if you have asthma.  Zinc gel and zinc lozenges, taken in the first 24 hours of the common cold, can shorten the duration and lessen the severity of symptoms. Pain medicines may help with fever, muscle aches, and throat pain. A variety of non-prescription medicines are available to treat congestion and runny nose. Your  caregiver can make recommendations and may suggest nasal or lung inhalers for other symptoms.  HOME CARE INSTRUCTIONS   Only take over-the-counter or prescription medicines for pain, discomfort, or fever as directed by your caregiver.   Use a warm mist humidifier or inhale steam from a shower to increase air moisture. This may keep secretions moist and make it easier to breathe.   Drink enough water and fluids to keep your urine clear or pale yellow.   Rest as needed.  Return to work when your temperature has returned to normal or as your caregiver advises. You may need to stay home longer to avoid infecting others. You can also use a face mask and careful hand washing to prevent spread of the virus.  SEEK MEDICAL CARE IF:   After the first few days, you feel you are getting worse rather than better.   You need your caregiver's advice about medicines to control symptoms.   You develop chills, worsening shortness of breath, or brown or red sputum. These may be signs of pneumonia.   You develop yellow or brown nasal discharge or pain in the face, especially when you bend forward. These may be signs of sinusitis.   You develop a fever, swollen neck glands, pain with swallowing, or white areas in the back of your throat. These may be signs of strep throat.  SEEK IMMEDIATE MEDICAL CARE IF:   You have a fever.   You develop severe or persistent headache, ear pain, sinus pain, or chest pain.   You develop wheezing, a prolonged cough, cough up blood, or have a change in your usual mucus (if you have chronic lung disease).   You develop sore muscles or a stiff neck.  Document Released: 07/24/2000 Document Revised: 01/17/2011 Document Reviewed: 06/01/2010 [facility] Patient Information 2012 [place], [place].  Thank you for enrolling in MyChart. Please follow the instructions below to securely access your online medical record. MyChart allows you to send messages to your doctor, view your test  results, renew your prescriptions, schedule appointments, and more.  How Do I Sign Up? 1. In your Internet browser, go to [email or web address] 2. Click on the New  User? link in the Sign In box.  3. Enter your MyChart Access Code exactly as it appears below. You will not need to use this code after you have completed the sign-up process. If you do not sign up before the expiration date, you must request a new code. MyChart Access Code: [email or web address] Expires: 12/04/2011 11:23 AM  4. Enter the last four digits of your Social Security Number (xxxx) and Date of Birth (mm/dd/yyyy) as indicated and click Next. You will be taken to the next sign-up page. 5. Create a MyChart ID. This will be your MyChart login ID and cannot be changed, so think of one that is secure and easy to remember. 6. Create a MyChart password. You can change your password at any time. 7. Enter your Password Reset Question and Answer and click Next. This can be used at a later time if you forget your password.  8. Select your communication preference, and if applicable enter your e-mail address. You will receive e-mail notification when new information is available in MyChart by choosing to receive e-mail notifications and filling in your e-mail. 9. Click Sign In. You can now view your medical record.   Additional Information If you have questions, you can email [email or web address] or call [phone] to talk to our MyChart staff. Remember, MyChart is NOT to be used for urgent needs. For medical emergencies, dial 911.      Return to clinic immediately if symptoms worsen or persist or new concerns.  No Follow-up on file.  Kriste Basque R.

## 2011-11-04 NOTE — Patient Instructions (Signed)
Back Pain: -advised will let her PCP change narcotics Rx if he feels that is needed -advised to use narcotics sparingly, use tylenol or ibuprofen as directed for pain (do not use tylenol if taking vicodin) -heat or ice as needed -keep moving and do gentle stretching as tolerated  Urine symptoms: -We have ordered labs for your at this visit. It usually takes 1-2 weeks for these to result and be processed. We will contact you with instructions if your results are abnormal. Normal results will be release to your [facility] in 1-2 weeks. If you have not heard from [place] or can not find your results in [facility] in 2 weeks please contact our office. -drink lots of water  Upper Respiratory Infection, Adult   An upper respiratory infection (URI) is also sometimes known as the common cold. The upper respiratory tract includes the nose, sinuses, throat, trachea, and bronchi. Bronchi are the airways leading to the lungs. Most people improve within 1 week, but symptoms can last up to 2 weeks. A residual cough may last even longer.   -can use sinex nasal spray for three day (not longer) -nasal saline three time daily -tylenol or ibuprofen as needed -plenty of rest and fluids  CAUSES Many different viruses can infect the tissues lining the upper respiratory tract. The tissues become irritated and inflamed and often become very moist. Mucus production is also common. A cold is contagious. You can easily spread the virus to others by oral contact. This includes kissing, sharing a glass, coughing, or sneezing. Touching your mouth or nose and then touching a surface, which is then touched by another person, can also spread the virus. SYMPTOMS  Symptoms typically develop 1 to 3 days after you come in contact with a cold virus. Symptoms vary from person to person. They may include:  Runny nose.   Sneezing.   Nasal congestion.   Sinus irritation.   Sore throat.   Loss of voice (laryngitis).   Cough.    Fatigue.   Muscle aches.   Loss of appetite.   Headache.   Low-grade fever.  DIAGNOSIS  You might diagnose your own cold based on familiar symptoms, since most people get a cold 2 to 3 times a year. Your caregiver can confirm this based on your exam. Most importantly, your caregiver can check that your symptoms are not due to another disease such as strep throat, sinusitis, pneumonia, asthma, or epiglottitis. Blood tests, throat tests, and X-rays are not necessary to diagnose a common cold, but they may sometimes be helpful in excluding other more serious diseases. Your caregiver will decide if any further tests are required. RISKS AND COMPLICATIONS  You may be at risk for a more severe case of the common cold if you smoke cigarettes, have chronic heart disease (such as heart failure) or lung disease (such as asthma), or if you have a weakened immune system. The very young and very old are also at risk for more serious infections. Bacterial sinusitis, middle ear infections, and bacterial pneumonia can complicate the common cold. The common cold can worsen asthma and chronic obstructive pulmonary disease (COPD). Sometimes, these complications can require emergency medical care and may be life-threatening. PREVENTION  The best way to protect against getting a cold is to practice good hygiene. Avoid oral or hand contact with people with cold symptoms. Wash your hands often if contact occurs. There is no clear evidence that vitamin C, vitamin E, echinacea, or exercise reduces the chance of developing a  cold. However, it is always recommended to get plenty of rest and practice good nutrition. TREATMENT  Treatment is directed at relieving symptoms. There is no cure. Antibiotics are not effective, because the infection is caused by a virus, not by bacteria. Treatment may include:  Increased fluid intake. Sports drinks offer valuable electrolytes, sugars, and fluids.   Breathing heated mist or steam  (vaporizer or shower).   Eating chicken soup or other clear broths, and maintaining good nutrition.   Getting plenty of rest.   Using gargles or lozenges for comfort.   Controlling fevers with ibuprofen or acetaminophen as directed by your caregiver.   Increasing usage of your inhaler if you have asthma.  Zinc gel and zinc lozenges, taken in the first 24 hours of the common cold, can shorten the duration and lessen the severity of symptoms. Pain medicines may help with fever, muscle aches, and throat pain. A variety of non-prescription medicines are available to treat congestion and runny nose. Your caregiver can make recommendations and may suggest nasal or lung inhalers for other symptoms.  HOME CARE INSTRUCTIONS   Only take over-the-counter or prescription medicines for pain, discomfort, or fever as directed by your caregiver.   Use a warm mist humidifier or inhale steam from a shower to increase air moisture. This may keep secretions moist and make it easier to breathe.   Drink enough water and fluids to keep your urine clear or pale yellow.   Rest as needed.   Return to work when your temperature has returned to normal or as your caregiver advises. You may need to stay home longer to avoid infecting others. You can also use a face mask and careful hand washing to prevent spread of the virus.  SEEK MEDICAL CARE IF:   After the first few days, you feel you are getting worse rather than better.   You need your caregiver's advice about medicines to control symptoms.   You develop chills, worsening shortness of breath, or brown or red sputum. These may be signs of pneumonia.   You develop yellow or brown nasal discharge or pain in the face, especially when you bend forward. These may be signs of sinusitis.   You develop a fever, swollen neck glands, pain with swallowing, or white areas in the back of your throat. These may be signs of strep throat.  SEEK IMMEDIATE MEDICAL CARE IF:     You have a fever.   You develop severe or persistent headache, ear pain, sinus pain, or chest pain.   You develop wheezing, a prolonged cough, cough up blood, or have a change in your usual mucus (if you have chronic lung disease).   You develop sore muscles or a stiff neck.  Document Released: 07/24/2000 Document Revised: 01/17/2011 Document Reviewed: 06/01/2010 [facility] Patient Information 2012 [place], [place].  Thank you for enrolling in MyChart. Please follow the instructions below to securely access your online medical record. MyChart allows you to send messages to your doctor, view your test results, renew your prescriptions, schedule appointments, and more.  How Do I Sign Up? 1. In your Internet browser, go to [email or web address] 2. Click on the New  User? link in the Sign In box.  3. Enter your MyChart Access Code exactly as it appears below. You will not need to use this code after you have completed the sign-up process. If you do not sign up before the expiration date, you must request a new code. MyChart Access Code:  [email or web address] Expires: 12/04/2011 11:23 AM  4. Enter the last four digits of your Social Security Number (xxxx) and Date of Birth (mm/dd/yyyy) as indicated and click Next. You will be taken to the next sign-up page. 5. Create a MyChart ID. This will be your MyChart login ID and cannot be changed, so think of one that is secure and easy to remember. 6. Create a MyChart password. You can change your password at any time. 7. Enter your Password Reset Question and Answer and click Next. This can be used at a later time if you forget your password.  8. Select your communication preference, and if applicable enter your e-mail address. You will receive e-mail notification when new information is available in MyChart by choosing to receive e-mail notifications and filling in your e-mail. 9. Click Sign In. You can now view your medical record.    Additional Information If you have questions, you can email [email or web address]  [email or web address] or call [phone] to talk to our MyChart staff. Remember, MyChart is NOT to be used for urgent needs. For medical emergencies, dial 911.

## 2011-11-04 NOTE — Telephone Encounter (Signed)
Caller: Shykeria/Patient; Phone: [phone]; Reason for Call: Patient was seen by Dr.  Selena Batten for back pain, UTI and upper respiratory issues.  States Dr.  Selena Batten was going to speak with Dr.  Lovell Sheehan to see if he would refill her Hydrocodone.  Patient calling to follow up.  Please call her back.  Thanks

## 2011-11-05 ENCOUNTER — Encounter: Payer: Self-pay | Admitting: Internal Medicine

## 2011-11-05 NOTE — Telephone Encounter (Signed)
As per note dated 9-16--already has filled all ms contine and sated they were too strong and was requ3esting vicodan then- suggested take hafl,but since she has already filled all ms contin,we cant fill vicodan until October 2-- this was told to pt again

## 2011-11-05 NOTE — Telephone Encounter (Signed)
Check with [facility]. The record appears that she got 3 prescriptions of MS Contin on July 3. See if she has filled a recent MS Contin at the pharmacy listed in the record. The pharmacist can also check to see if any MS Contin has been filled locally in her name  If she has not filled  MS Contin, then she can have 60 Vicodin ES 7.5 to last 30 days.

## 2011-11-06 LAB — URINE CULTURE

## 2011-11-06 NOTE — Progress Notes (Signed)
Quick Note:  Called and spoke with pt and pt is aware. ______ 

## 2011-11-11 ENCOUNTER — Telehealth: Payer: Self-pay | Admitting: Internal Medicine

## 2011-11-11 DIAGNOSIS — M545 Low back pain: Secondary | ICD-10-CM

## 2011-11-11 MED ORDER — HYDROCODONE-ACETAMINOPHEN 7.5-500 MG PO TABS: 1.0000 | ORAL_TABLET | Freq: Three times a day (TID) | ORAL | Status: DC | PRN

## 2011-11-11 NOTE — Telephone Encounter (Signed)
Per dr Lovell Sheehan- may have back xray and refill hydrocodone and use mucinex fast max cough and cold- pt informed

## 2011-11-11 NOTE — Telephone Encounter (Addendum)
Pt saw Dr Selena Batten last week for back pain now pain radiating to right hip. Pt is requesting back xray and something for cough. Pt is requesting a refill on hydrocodone call into [facility] [phone]

## 2011-11-12 ENCOUNTER — Ambulatory Visit (INDEPENDENT_AMBULATORY_CARE_PROVIDER_SITE_OTHER)
Admission: RE | Admit: 2011-11-12 | Discharge: 2011-11-12 | Disposition: A | Discharge status: Home / Self Care | Payer: PRIVATE HEALTH INSURANCE | Source: Ambulatory Visit | Attending: Internal Medicine | Admitting: Internal Medicine

## 2011-11-12 ENCOUNTER — Other Ambulatory Visit: Payer: Self-pay | Admitting: Internal Medicine

## 2011-11-12 DIAGNOSIS — M545 Low back pain: Secondary | ICD-10-CM

## 2011-11-13 ENCOUNTER — Telehealth: Payer: Self-pay | Admitting: Internal Medicine

## 2011-11-13 NOTE — Telephone Encounter (Signed)
Pt called and said that she wants to be sure a copy of her xray is sent to Dr. Regino Schultze at [facility]. Pt has an appt there next Wed 11/20/11.

## 2011-11-13 NOTE — Telephone Encounter (Signed)
Faxed as requested

## 2011-11-15 ENCOUNTER — Other Ambulatory Visit: Payer: Self-pay | Admitting: Internal Medicine

## 2011-12-10 ENCOUNTER — Other Ambulatory Visit: Payer: Self-pay | Admitting: Internal Medicine

## 2011-12-16 ENCOUNTER — Ambulatory Visit (INDEPENDENT_AMBULATORY_CARE_PROVIDER_SITE_OTHER): Payer: PRIVATE HEALTH INSURANCE | Admitting: Internal Medicine

## 2011-12-16 ENCOUNTER — Encounter: Payer: Self-pay | Admitting: Internal Medicine

## 2011-12-16 VITALS — BP 136/80 | HR 72 | Temp 98.0°F | Resp 16 | Ht 70.5 in | Wt 181.0 lb

## 2011-12-16 DIAGNOSIS — I10 Essential (primary) hypertension: Secondary | ICD-10-CM

## 2011-12-16 DIAGNOSIS — Z23 Encounter for immunization: Secondary | ICD-10-CM

## 2011-12-16 DIAGNOSIS — M25519 Pain in unspecified shoulder: Secondary | ICD-10-CM

## 2011-12-16 DIAGNOSIS — M19012 Primary osteoarthritis, left shoulder: Secondary | ICD-10-CM

## 2011-12-16 DIAGNOSIS — M25512 Pain in left shoulder: Secondary | ICD-10-CM

## 2011-12-16 DIAGNOSIS — M19019 Primary osteoarthritis, unspecified shoulder: Secondary | ICD-10-CM

## 2011-12-16 DIAGNOSIS — IMO0002 Reserved for concepts with insufficient information to code with codable children: Secondary | ICD-10-CM

## 2011-12-16 DIAGNOSIS — F17200 Nicotine dependence, unspecified, uncomplicated: Secondary | ICD-10-CM

## 2011-12-16 MED ORDER — TIZANIDINE HCL 4 MG PO TABS: 4.0000 mg | ORAL_TABLET | Freq: Two times a day (BID) | ORAL | Status: DC

## 2011-12-16 MED ORDER — METHYLPREDNISOLONE ACETATE 40 MG/ML IJ SUSP: 40.0000 mg | Freq: Once | INTRAMUSCULAR | Status: DC

## 2011-12-16 NOTE — Progress Notes (Signed)
Subjective:    Patient ID: Laurie [name], female    DOB: 1955/07/03, 56 y.o.   MRN: [id number]  HPI Had a flair of LS spine pain followed by shoulder pain. Has increased muscle spasm. Has been using vicoprofen. Taking two a day. Liver CBC and renal was stable Has polyarticular arthritis with increased pain       Review of Systems  Constitutional: Negative for activity change, appetite change and fatigue.  HENT: Negative for ear pain, congestion, neck pain, postnasal drip and sinus pressure.   Eyes: Negative for redness and visual disturbance.  Respiratory: Negative for cough, shortness of breath and wheezing.   Gastrointestinal: Negative for abdominal pain and abdominal distention.  Genitourinary: Negative for dysuria, frequency and menstrual problem.  Musculoskeletal: Positive for myalgias, joint swelling and arthralgias.  Skin: Negative for rash and wound.  Neurological: Negative for dizziness, weakness and headaches.  Hematological: Negative for adenopathy. Does not bruise/bleed easily.  Psychiatric/Behavioral: Positive for dysphoric mood. Negative for sleep disturbance and decreased concentration.   Past Medical History  Diagnosis Date  . IBS (irritable bowel syndrome)   . Esophageal stricture   . Chronic back pain   . Depression   . Interstitial cystitis     History   Social History  . Marital Status: Single    Spouse Name: N/A    Number of Children: N/A  . Years of Education: N/A   Occupational History  . Not on file.   Social History Main Topics  . Smoking status: Current Every Day Smoker -- 1.0 packs/day    Types: Cigarettes  . Smokeless tobacco: Never Used  . Alcohol Use: No  . Drug Use: No  . Sexually Active: Not on file   Other Topics Concern  . Not on file   Social History Narrative  . No narrative on file    Past Surgical History  Procedure Date  . Abdominal hysterectomy   . Appendectomy   . Tonsillectomy   . Finger surgery   .  Cervical laminectomy   . Cervical fusion   . Lumbar fusion   . Esophagogastroduodenoscopy     Family History  Problem Relation Age of Onset  . Dementia Mother   . Heart attack Father   . Coronary artery disease Father   . Cancer Brother     esophageal  . Coronary artery disease Paternal Aunt   . Coronary artery disease Paternal Grandmother   . Aneurysm Brother     aortic    Allergies  Allergen Reactions  . Codeine     REACTION: Vomiting  . Sulfonamide Derivatives     REACTION: Upset GI    Current Outpatient Prescriptions on File Prior to Visit  Medication Sig Dispense Refill  . ALPRAZolam (XANAX) 0.5 MG tablet TAKE 1 TABLET EVERY DAY AS NEEDED  90 tablet  0  . aspirin 81 MG tablet Take 81 mg by mouth daily.        . bisoprolol-hydrochlorothiazide (ZIAC) 2.5-6.25 MG per tablet Take 1 tablet by mouth daily.  30 tablet  11  . diazepam (VALIUM) 5 MG tablet TAKE 1 TABLET BY MOUTH 3 TIMES A DAY AS NEEDED **MUST LAST 1 MONTH**  90 tablet  3  . HYDROcodone-acetaminophen (LORTAB) 7.5-500 MG per tablet TAKE 1 TABLET EVERY 8 HOURS AS NEEDED **MUST LAST 30 DAYS**  60 tablet  0  . rosuvastatin (CRESTOR) 20 MG tablet Take 1 tablet (20 mg total) by mouth 2 (two) times a week.  90  tablet  3  . venlafaxine (EFFEXOR XR) 150 MG 24 hr capsule Take 1 capsule (150 mg total) by mouth daily.  30 capsule  6   Current Facility-Administered Medications on File Prior to Visit  Medication Dose Route Frequency Provider Last Rate Last Dose  . [DISCONTINUED] methylPREDNISolone acetate (DEPO-MEDROL) injection 40 mg  40 mg Intra-articular Once Stacie Glaze, MD        BP 136/80  Pulse 72  Temp 98 F (36.7 C)  Resp 16  Ht 5' 10.5" (1.791 m)  Wt 181 lb (82.101 kg)  BMI 25.60 kg/m2       Objective:   Physical Exam  Nursing note and vitals reviewed. Constitutional: She is oriented to person, place, and time. She appears well-developed and well-nourished. No distress.  HENT:  Head: Normocephalic  and atraumatic.  Eyes: Conjunctivae normal and EOM are normal. Pupils are equal, round, and reactive to light.  Neck: Normal range of motion. Neck supple. No JVD present. No tracheal deviation present. No thyromegaly present.  Cardiovascular: Normal rate and regular rhythm.   Murmur heard. Pulmonary/Chest: Effort normal and breath sounds normal. She has no wheezes. She exhibits no tenderness.  Abdominal: Soft. Bowel sounds are normal.  Musculoskeletal: She exhibits edema and tenderness.       Left shoulder  Lymphadenopathy:    She has no cervical adenopathy.  Neurological: She is alert and oriented to person, place, and time. She has normal reflexes. No cranial nerve deficit.  Skin: Skin is warm and dry. She is not diaphoretic.  Psychiatric: She has a normal mood and affect. Her behavior is normal.          Assessment & Plan:  Poly articular arthritis Today shoulder pain in the right shoulder increased with hx of cervical radiculopathy and "frozen shoulder" Blood pressure stable without taking the beta blocker  Informed consent obtained and the patient's left shoulder and was prepped with betadine. Local anesthesia was obtained with topical spray. Then 40 mg of Depo-Medrol and 1/2 cc of lidocaine was injected into the joint space. The patient tolerated the procedure without complications. Post injection care discussed with patient.

## 2012-01-07 ENCOUNTER — Other Ambulatory Visit: Payer: Self-pay | Admitting: Internal Medicine

## 2012-01-07 MED ORDER — HYDROCODONE-ACETAMINOPHEN 7.5-500 MG PO TABS: 1.0000 | ORAL_TABLET | Freq: Three times a day (TID) | ORAL | Status: DC | PRN

## 2012-01-07 NOTE — Telephone Encounter (Signed)
Pt need refill on generic lortab 7.5-500. Pt is requesting #90 and will three pills a day. [facility] [phone]

## 2012-01-31 ENCOUNTER — Other Ambulatory Visit: Payer: Self-pay | Admitting: Internal Medicine

## 2012-01-31 ENCOUNTER — Telehealth: Payer: Self-pay | Admitting: Internal Medicine

## 2012-01-31 NOTE — Telephone Encounter (Addendum)
Pt is taking 3 hydrocodone pills  a day instead of 2 pills. Please call in #90 for 30 day supply. [facility]. Pt is aware MD out of office

## 2012-02-03 ENCOUNTER — Telehealth: Payer: Self-pay | Admitting: Internal Medicine

## 2012-02-03 ENCOUNTER — Other Ambulatory Visit: Payer: Self-pay | Admitting: *Deleted

## 2012-02-03 MED ORDER — HYDROCODONE-ACETAMINOPHEN 7.5-500 MG PO TABS: 1.0000 | ORAL_TABLET | Freq: Three times a day (TID) | ORAL | Status: DC | PRN

## 2012-02-03 NOTE — Telephone Encounter (Signed)
Rx is called in and patient is aware

## 2012-02-03 NOTE — Telephone Encounter (Signed)
Patient called stating that she need a refill of her hydrocodone 7.5mg  as she is in a lot of pain. Please assist as the MD per pt has advised her to call if needed.

## 2012-02-03 NOTE — Telephone Encounter (Signed)
50

## 2012-02-03 NOTE — Telephone Encounter (Signed)
done

## 2012-02-28 ENCOUNTER — Other Ambulatory Visit: Payer: Self-pay | Admitting: Internal Medicine

## 2012-03-03 ENCOUNTER — Other Ambulatory Visit: Payer: Self-pay | Admitting: Internal Medicine

## 2012-03-04 ENCOUNTER — Other Ambulatory Visit: Payer: Self-pay | Admitting: *Deleted

## 2012-03-09 ENCOUNTER — Other Ambulatory Visit: Payer: Self-pay | Admitting: Internal Medicine

## 2012-03-16 ENCOUNTER — Other Ambulatory Visit: Payer: Self-pay | Admitting: *Deleted

## 2012-03-16 ENCOUNTER — Other Ambulatory Visit: Payer: Self-pay | Admitting: Internal Medicine

## 2012-03-16 NOTE — Telephone Encounter (Signed)
Rx refill for Diazepam called into pharmacy.

## 2012-03-18 ENCOUNTER — Encounter: Payer: Self-pay | Admitting: Internal Medicine

## 2012-03-18 ENCOUNTER — Ambulatory Visit (INDEPENDENT_AMBULATORY_CARE_PROVIDER_SITE_OTHER): Payer: PRIVATE HEALTH INSURANCE | Admitting: Internal Medicine

## 2012-03-18 ENCOUNTER — Ambulatory Visit (INDEPENDENT_AMBULATORY_CARE_PROVIDER_SITE_OTHER)
Admission: RE | Admit: 2012-03-18 | Discharge: 2012-03-18 | Disposition: A | Discharge status: Home / Self Care | Payer: PRIVATE HEALTH INSURANCE | Source: Ambulatory Visit | Attending: Internal Medicine | Admitting: Internal Medicine

## 2012-03-18 VITALS — BP 170/94 | HR 98 | Temp 98.2°F | Resp 16 | Ht 70.5 in | Wt 178.0 lb

## 2012-03-18 DIAGNOSIS — M542 Cervicalgia: Secondary | ICD-10-CM

## 2012-03-18 DIAGNOSIS — F17200 Nicotine dependence, unspecified, uncomplicated: Secondary | ICD-10-CM

## 2012-03-18 DIAGNOSIS — I10 Essential (primary) hypertension: Secondary | ICD-10-CM

## 2012-03-18 DIAGNOSIS — R079 Chest pain, unspecified: Secondary | ICD-10-CM

## 2012-03-18 DIAGNOSIS — E785 Hyperlipidemia, unspecified: Secondary | ICD-10-CM

## 2012-03-18 MED ORDER — NEBIVOLOL HCL 5 MG PO TABS: 5.0000 mg | ORAL_TABLET | Freq: Every day | ORAL | Status: DC

## 2012-03-18 NOTE — Patient Instructions (Addendum)
The patient is instructed to continue all medications as prescribed. Schedule followup with check out clerk upon leaving the clinic  If chest discomfort is not significantly improved with the addition of the beta blocker And cessation of smoking contact my office Been given a nitroglycerin spray to use as needed for chest pain if you feel you have to use a nitroglycerin spray and her chest pain is immediately relieved, you are to go to the emergency room For new significant chest pain occurs your instructed to go immediately to the emergency room

## 2012-03-18 NOTE — Progress Notes (Signed)
Subjective:    Patient ID: Laurie [name], female    DOB: [birth year], 57 y.o.   MRN: [id number]  HPI Significant increased pain in neck and shoulder Elevated blood pressure with chest pressure has been out of breath and "tired and week" with pain in between shoulder blades  Echo with grade 2 diastolic dysfunction Cardiac catheterization in 2011 with 20-30% blockages Risk factors include hyperlipidemia hypertension and age along with current tobacco use    Review of Systems  Constitutional: Positive for activity change and fatigue. Negative for appetite change.  HENT: Negative for ear pain, congestion, neck pain, postnasal drip and sinus pressure.   Eyes: Negative for redness and visual disturbance.  Respiratory: Positive for shortness of breath. Negative for cough and wheezing.   Cardiovascular: Positive for chest pain.  Gastrointestinal: Negative for abdominal pain and abdominal distention.  Genitourinary: Negative for dysuria, frequency and menstrual problem.  Musculoskeletal: Positive for back pain. Negative for myalgias, joint swelling and arthralgias.  Skin: Negative for rash and wound.  Neurological: Negative for dizziness, weakness and headaches.  Hematological: Negative for adenopathy. Does not bruise/bleed easily.  Psychiatric/Behavioral: Negative for sleep disturbance and decreased concentration.   Past Medical History  Diagnosis Date  . IBS (irritable bowel syndrome)   . Esophageal stricture   . Chronic back pain   . Depression   . Interstitial cystitis     History   Social History  . Marital Status: Single    Spouse Name: N/A    Number of Children: N/A  . Years of Education: N/A   Occupational History  . Not on file.   Social History Main Topics  . Smoking status: Current Every Day Smoker -- 1.0 packs/day    Types: Cigarettes  . Smokeless tobacco: Never Used  . Alcohol Use: No  . Drug Use: No  . Sexually Active: Not on file   Other Topics Concern   . Not on file   Social History Narrative  . No narrative on file    Past Surgical History  Procedure Date  . Abdominal hysterectomy   . Appendectomy   . Tonsillectomy   . Finger surgery   . Cervical laminectomy   . Cervical fusion   . Lumbar fusion   . Esophagogastroduodenoscopy     Family History  Problem Relation Age of Onset  . Dementia Mother   . Heart attack Father   . Coronary artery disease Father   . Cancer Brother     esophageal  . Coronary artery disease Paternal Aunt   . Coronary artery disease Paternal Grandmother   . Aneurysm Brother     aortic    Allergies  Allergen Reactions  . Codeine     REACTION: Vomiting  . Sulfonamide Derivatives     REACTION: Upset GI    Current Outpatient Prescriptions on File Prior to Visit  Medication Sig Dispense Refill  . ALPRAZolam (XANAX) 0.5 MG tablet TAKE 1 TABLET EVERY DAY AS NEEDED  90 tablet  0  . aspirin 81 MG tablet Take 81 mg by mouth daily.        . diazepam (VALIUM) 5 MG tablet TAKE 1 TABLET BY MOUTH 3 TIMES A DAY MUST LAST 1 MONTH  90 tablet  3  . HYDROcodone-acetaminophen (NORCO) 7.5-325 MG per tablet Take 1 tablet by mouth every 6 (six) hours as needed.      Marland Kitchen tiZANidine (ZANAFLEX) 4 MG tablet Take 1 tablet (4 mg total) by mouth 2 (two) times daily.  60 tablet  3  . venlafaxine XR (EFFEXOR-XR) 150 MG 24 hr capsule TAKE ONE CAPSULE BY MOUTH EVERY DAY  30 capsule  6  . bisoprolol-hydrochlorothiazide (ZIAC) 2.5-6.25 MG per tablet Take 1 tablet by mouth daily.  30 tablet  11  . rosuvastatin (CRESTOR) 20 MG tablet Take 1 tablet (20 mg total) by mouth 2 (two) times a week.  90 tablet  3    BP 170/94  Pulse 98  Temp 98.2 F (36.8 C)  Resp 16  Ht 5' 10.5" (1.791 m)  Wt 178 lb (80.74 kg)  BMI 25.18 kg/m2       Objective:   Physical Exam  Constitutional: She is oriented to person, place, and time. She appears well-developed and well-nourished. No distress.  HENT:  Head: Normocephalic and atraumatic.   Right Ear: External ear normal.  Left Ear: External ear normal.  Nose: Nose normal.  Mouth/Throat: Oropharynx is clear and moist.  Eyes: Conjunctivae normal and EOM are normal. Pupils are equal, round, and reactive to light.  Neck: Normal range of motion. Neck supple. No JVD present. No tracheal deviation present. No thyromegaly present.  Cardiovascular: Normal rate, regular rhythm, normal heart sounds and intact distal pulses.   No murmur heard. Pulmonary/Chest: Effort normal and breath sounds normal. She has no wheezes. She exhibits no tenderness.  Abdominal: Soft. Bowel sounds are normal.  Musculoskeletal: Normal range of motion. She exhibits no edema and no tenderness.  Lymphadenopathy:    She has no cervical adenopathy.  Neurological: She is alert and oriented to person, place, and time. She has normal reflexes. No cranial nerve deficit.  Skin: Skin is warm and dry. She is not diaphoretic.  Psychiatric: She has a normal mood and affect. Her behavior is normal.          Assessment & Plan:  Did not take the blood pressure medications or the lipids drugs And has "not bee feeling well" Still smoking More concerned over pain control that CV risk MUST take the crestor, must take bystollic EKG shows st segment change unchanged from 2012 when she had a Cardiac Cath.  aggressive control of blood pressure with a beta blocker and urge compliance with smoking cessation and lipid management.  Repeat office visit in 2 weeks with Dr Orvan Falconer to be sure her blood pressure control is adequate

## 2012-03-23 ENCOUNTER — Telehealth: Payer: Self-pay | Admitting: Internal Medicine

## 2012-03-23 NOTE — Telephone Encounter (Signed)
Per dr Lovell Sheehan-  Solid fusion with some ddd- call [facility]

## 2012-03-23 NOTE — Telephone Encounter (Signed)
Pt would like results of neck Xray done 03/18/12. Pls call

## 2012-03-25 ENCOUNTER — Encounter: Payer: Self-pay | Admitting: Internal Medicine

## 2012-03-25 ENCOUNTER — Ambulatory Visit: Payer: PRIVATE HEALTH INSURANCE | Admitting: Family Medicine

## 2012-03-25 ENCOUNTER — Ambulatory Visit (INDEPENDENT_AMBULATORY_CARE_PROVIDER_SITE_OTHER): Payer: PRIVATE HEALTH INSURANCE | Admitting: Internal Medicine

## 2012-03-25 VITALS — BP 150/90 | HR 72 | Temp 98.3°F | Resp 16 | Ht 70.5 in | Wt 178.0 lb

## 2012-03-25 DIAGNOSIS — F17200 Nicotine dependence, unspecified, uncomplicated: Secondary | ICD-10-CM

## 2012-03-25 DIAGNOSIS — M5010 Cervical disc disorder with radiculopathy, unspecified cervical region: Secondary | ICD-10-CM

## 2012-03-25 DIAGNOSIS — I10 Essential (primary) hypertension: Secondary | ICD-10-CM

## 2012-03-25 DIAGNOSIS — M25519 Pain in unspecified shoulder: Secondary | ICD-10-CM

## 2012-03-25 DIAGNOSIS — M5412 Radiculopathy, cervical region: Secondary | ICD-10-CM

## 2012-03-25 DIAGNOSIS — M25512 Pain in left shoulder: Secondary | ICD-10-CM

## 2012-03-25 MED ORDER — METHYLPREDNISOLONE (PAK) 4 MG PO TABS: ORAL_TABLET | ORAL | Status: DC

## 2012-03-25 MED ORDER — METHYLPREDNISOLONE ACETATE 40 MG/ML IJ SUSP: 40.0000 mg | Freq: Once | INTRAMUSCULAR | Status: DC

## 2012-03-25 NOTE — Progress Notes (Signed)
  Subjective:    Patient ID: Laurie [name], female    DOB: [birth year], 57 y.o.   MRN: [id number]  HPIpatient is a 57 year old female who presents for followup of hypertension her blood pressures elevated at 150/90 today but she is in significant pain in her shoulder the pain radiates to the back of her shoulder around the shoulder blade and scapula. She has a pleuritic component to her pain in that when she takes a deep breath it is exacerbated. Her current pain medicine is insufficient for control of her pain    Review of Systems  Constitutional: Positive for activity change and appetite change.  HENT: Positive for neck pain and neck stiffness.   Eyes: Negative for pain.  Respiratory: Positive for cough.   Cardiovascular: Negative.   Gastrointestinal: Negative.   Endocrine: Negative.   Genitourinary: Negative.        Objective:   Physical Exam  Nursing note and vitals reviewed. Constitutional: She appears well-developed and well-nourished. No distress.  HENT:  Head: Normocephalic and atraumatic.  Right Ear: External ear normal.  Left Ear: External ear normal.  Nose: Nose normal.  Mouth/Throat: Oropharynx is clear and moist.  Eyes: Conjunctivae and EOM are normal. Pupils are equal, round, and reactive to light.  Neck: Normal range of motion. Neck supple. No JVD present. No tracheal deviation present. No thyromegaly present.  Cardiovascular: Normal rate, regular rhythm, normal heart sounds and intact distal pulses.   No murmur heard. Pulmonary/Chest: Effort normal and breath sounds normal. She has no wheezes. She exhibits no tenderness.  Abdominal: Soft. Bowel sounds are normal.  Musculoskeletal: She exhibits edema and tenderness.  Lymphadenopathy:    She has no cervical adenopathy.  Neurological: No cranial nerve deficit.  Skin: She is not diaphoretic.          Assessment & Plan:  She has progression of disease at the C6-7 level and this is most probably the  expiration for her significant pain in her shoulder radiating into her scapula. She not return to Dr. Danielle Dess her neurosurgeon who did her original fusion due to her insurance so we will have to refer her to a different neurosurgical office for evaluation in the interim we will go ahead and give her a shot of pain medication in the upper neck and place her on a Medrol Dosepak   Informed consent obtained and the patient'sright shoulderwas prepped with betadine. Local anesthesia was obtained with topical spray. Then 40 mg of Depo-Medrol and 1/2 cc of lidocaine was injected into the joint space. The patient tolerated the procedure without complications. Post injection care discussed with patient.

## 2012-03-31 ENCOUNTER — Ambulatory Visit (INDEPENDENT_AMBULATORY_CARE_PROVIDER_SITE_OTHER)
Admission: RE | Admit: 2012-03-31 | Discharge: 2012-03-31 | Disposition: A | Discharge status: Home / Self Care | Payer: PRIVATE HEALTH INSURANCE | Source: Ambulatory Visit | Attending: Family | Admitting: Family

## 2012-03-31 ENCOUNTER — Encounter: Payer: Self-pay | Admitting: Internal Medicine

## 2012-03-31 ENCOUNTER — Telehealth: Payer: Self-pay | Admitting: *Deleted

## 2012-03-31 ENCOUNTER — Encounter: Payer: Self-pay | Admitting: Family

## 2012-03-31 ENCOUNTER — Ambulatory Visit (INDEPENDENT_AMBULATORY_CARE_PROVIDER_SITE_OTHER): Payer: PRIVATE HEALTH INSURANCE | Admitting: Family

## 2012-03-31 VITALS — BP 160/100 | HR 85 | Wt 178.0 lb

## 2012-03-31 DIAGNOSIS — R5381 Other malaise: Secondary | ICD-10-CM

## 2012-03-31 DIAGNOSIS — I10 Essential (primary) hypertension: Secondary | ICD-10-CM

## 2012-03-31 DIAGNOSIS — F411 Generalized anxiety disorder: Secondary | ICD-10-CM

## 2012-03-31 DIAGNOSIS — R079 Chest pain, unspecified: Secondary | ICD-10-CM

## 2012-03-31 DIAGNOSIS — IMO0001 Reserved for inherently not codable concepts without codable children: Secondary | ICD-10-CM

## 2012-03-31 DIAGNOSIS — F419 Anxiety disorder, unspecified: Secondary | ICD-10-CM

## 2012-03-31 LAB — LIPID PANEL
Total CHOL/HDL Ratio: 5
Triglycerides: 235 mg/dL — ABNORMAL HIGH (ref 0.0–149.0)

## 2012-03-31 LAB — LDL CHOLESTEROL, DIRECT: Direct LDL: 150.4 mg/dL

## 2012-03-31 LAB — TSH: TSH: 2.9 u[IU]/mL (ref 0.35–5.50)

## 2012-03-31 MED ORDER — NEBIVOLOL HCL 10 MG PO TABS: 10.0000 mg | ORAL_TABLET | Freq: Every day | ORAL | Status: DC

## 2012-03-31 NOTE — Progress Notes (Signed)
Subjective:    Patient ID: Laurie [name], female    DOB: 04/18/1955, 57 y.o.   MRN: [id number]  HPI 57 year old white female, smoker, patient of Dr. Lovell Sheehan is in today for recheck of hypertension. Dr. Lovell Sheehan [facility] on diastolic 5 mg once daily. She's tolerating the medication well. However, complaints of chest pain and pressure that began this past week and off and on. Patient reports taken Prilosec as well as her nitroglycerin and the pain went away. The pain appears to be worse with exertion. But also reports an increase in belching and burping. Patient reports a history of GERD and esophageal stricture for which she's had to have stretched in the past. She had an EKG at her last office visit that showed nonspecific ST changes unchanged from her last EKG cardiology and 2012. She also has a history of anxiety for which she's taking Effexor 150 mg once daily.  Patient also reports a history of fibromyalgia. She has multiple joint pain today that is worsened over the last several days. Denies any increase in stress.  As of note, patient did not have her labs drawn that Dr. Lovell Sheehan requested she have drawn on 03/19/2011. Reports being under strict time constraints and cannot have them done.   Review of Systems  Constitutional: Negative.   HENT: Negative.   Respiratory: Negative.   Cardiovascular: Positive for chest pain.  Gastrointestinal: Negative.   Genitourinary: Negative.   Musculoskeletal: Negative.   Skin: Negative.   Neurological: Negative.   Hematological: Negative.   Psychiatric/Behavioral: Negative.    Past Medical History  Diagnosis Date  . IBS (irritable bowel syndrome)   . Esophageal stricture   . Chronic back pain   . Depression   . Interstitial cystitis     History   Social History  . Marital Status: Single    Spouse Name: N/A    Number of Children: N/A  . Years of Education: N/A   Occupational History  . Not on file.   Social History Main Topics  .  Smoking status: Current Every Day Smoker -- 1.00 packs/day    Types: Cigarettes  . Smokeless tobacco: Never Used  . Alcohol Use: No  . Drug Use: No  . Sexually Active: Not on file   Other Topics Concern  . Not on file   Social History Narrative  . No narrative on file    Past Surgical History  Procedure Laterality Date  . Abdominal hysterectomy    . Appendectomy    . Tonsillectomy    . Finger surgery    . Cervical laminectomy    . Cervical fusion    . Lumbar fusion    . Esophagogastroduodenoscopy      Family History  Problem Relation Age of Onset  . Dementia Mother   . Heart attack Father   . Coronary artery disease Father   . Cancer Brother     esophageal  . Coronary artery disease Paternal Aunt   . Coronary artery disease Paternal Grandmother   . Aneurysm Brother     aortic    Allergies  Allergen Reactions  . Codeine     REACTION: Vomiting  . Sulfonamide Derivatives     REACTION: Upset GI    Current Outpatient Prescriptions on File Prior to Visit  Medication Sig Dispense Refill  . ALPRAZolam (XANAX) 0.5 MG tablet TAKE 1 TABLET EVERY DAY AS NEEDED  90 tablet  0  . aspirin 81 MG tablet Take 81 mg by mouth daily.        Marland Kitchen  diazepam (VALIUM) 5 MG tablet TAKE 1 TABLET BY MOUTH 3 TIMES A DAY MUST LAST 1 MONTH  90 tablet  3  . HYDROcodone-acetaminophen (NORCO) 7.5-325 MG per tablet Take 1 tablet by mouth every 6 (six) hours as needed.      . methylPREDNIsolone (MEDROL DOSPACK) 4 MG tablet follow package directions  21 tablet  0  . tiZANidine (ZANAFLEX) 4 MG tablet Take 1 tablet (4 mg total) by mouth 2 (two) times daily.  60 tablet  3  . venlafaxine XR (EFFEXOR-XR) 150 MG 24 hr capsule TAKE ONE CAPSULE BY MOUTH EVERY DAY  30 capsule  6  . rosuvastatin (CRESTOR) 20 MG tablet Take 1 tablet (20 mg total) by mouth 2 (two) times a week.  90 tablet  3   No current facility-administered medications on file prior to visit.    BP 160/100  Pulse 85  Wt 178 lb (80.74 kg)   BMI 25.17 kg/m2chart    Objective:   Physical Exam  Constitutional: She is oriented to person, place, and time. She appears well-developed and well-nourished.  HENT:  Right Ear: External ear normal.  Left Ear: External ear normal.  Nose: Nose normal.  Mouth/Throat: Oropharynx is clear and moist.  Neck: Normal range of motion. Neck supple.  Cardiovascular: Normal rate, regular rhythm and normal heart sounds.   Pulmonary/Chest: Effort normal and breath sounds normal.  Abdominal: Soft. Bowel sounds are normal.  Musculoskeletal: Normal range of motion.  Neurological: She is alert and oriented to person, place, and time. She has normal reflexes.  Skin: Skin is warm and dry.  Psychiatric: She has a normal mood and affect.          Assessment & Plan:  Assessment:  1. Chest pain 2. GERD 3. Anxiety 4. Multiple joint pain   Plan: Chest x-ray. Lab sent. Labs also from Dr. Lovell Sheehan on 03/18/56. Refer to cardiology given her family history of sudden death at age 57. Patient advised to go to the emergency department for chest pain returns.

## 2012-03-31 NOTE — Telephone Encounter (Signed)
Called patient at home. Left a message. I need to talk to this patient about possibly coming back in for lab work she forgot to have drawn.

## 2012-04-01 ENCOUNTER — Telehealth: Payer: Self-pay | Admitting: *Deleted

## 2012-04-01 LAB — ANA: Anti Nuclear Antibody(ANA): NEGATIVE

## 2012-04-01 LAB — CBC WITH DIFFERENTIAL/PLATELET
Basophils Absolute: 0 10*3/uL (ref 0.0–0.1)
Basophils Relative: 0.3 % (ref 0.0–3.0)
Eosinophils Absolute: 0.3 10*3/uL (ref 0.0–0.7)
Lymphocytes Relative: 22.9 % (ref 12.0–46.0)
MCHC: 34.1 g/dL (ref 30.0–36.0)
Neutrophils Relative %: 69 % (ref 43.0–77.0)
Platelets: 304 10*3/uL (ref 150.0–400.0)
RBC: 4.54 Mil/uL (ref 3.87–5.11)
RDW: 13.5 % (ref 11.5–14.6)

## 2012-04-01 LAB — RHEUMATOID FACTOR: Rhuematoid fact SerPl-aCnc: <10 IU/mL (ref <=14)

## 2012-04-01 NOTE — Addendum Note (Signed)
Addended by: Bonnye Fava on: 04/01/2012 08:54 AM   Modules accepted: Orders

## 2012-04-01 NOTE — Telephone Encounter (Signed)
Already talked with pt and she was going to call neurosurgeon

## 2012-04-02 ENCOUNTER — Encounter: Payer: Self-pay | Admitting: Internal Medicine

## 2012-04-02 ENCOUNTER — Telehealth: Payer: Self-pay | Admitting: Internal Medicine

## 2012-04-02 NOTE — Telephone Encounter (Signed)
See results note. 

## 2012-04-02 NOTE — Telephone Encounter (Signed)
Patient called stating that she would like a call back with lab results. Please assist.

## 2012-04-03 ENCOUNTER — Other Ambulatory Visit: Payer: Self-pay | Admitting: Internal Medicine

## 2012-04-03 MED ORDER — HYDROCODONE-ACETAMINOPHEN 7.5-325 MG PO TABS: 1.0000 | ORAL_TABLET | Freq: Four times a day (QID) | ORAL | Status: DC | PRN

## 2012-04-03 NOTE — Telephone Encounter (Signed)
Pt also would like blood work results

## 2012-04-23 ENCOUNTER — Ambulatory Visit (INDEPENDENT_AMBULATORY_CARE_PROVIDER_SITE_OTHER): Payer: PRIVATE HEALTH INSURANCE | Admitting: Cardiovascular Disease

## 2012-04-23 ENCOUNTER — Encounter: Payer: Self-pay | Admitting: Cardiovascular Disease

## 2012-04-23 VITALS — BP 135/80 | HR 77 | Wt 181.0 lb

## 2012-04-23 DIAGNOSIS — R079 Chest pain, unspecified: Secondary | ICD-10-CM | POA: Insufficient documentation

## 2012-04-23 DIAGNOSIS — I10 Essential (primary) hypertension: Secondary | ICD-10-CM

## 2012-04-23 DIAGNOSIS — R0600 Dyspnea, unspecified: Secondary | ICD-10-CM | POA: Insufficient documentation

## 2012-04-23 DIAGNOSIS — R011 Cardiac murmur, unspecified: Secondary | ICD-10-CM | POA: Insufficient documentation

## 2012-04-23 NOTE — Assessment & Plan Note (Signed)
Well controlled.  Continue current medications and low sodium Dash type diet.    

## 2012-04-23 NOTE — Progress Notes (Signed)
Patient ID: Laurie [name], female   DOB: 1955/07/19, 57 y.o.   MRN: [id number] 57 year old white female, patient of DR Alice Reichert.  Added on to DOD schedule for chest pain   Began 2nd week February  off and on. Patient reports taken Prilosec as well as her nitroglycerin and the pain went away. The pain appears to be worse with exertion. But also reports an increase in belching and burping. Patient reports a history of GERD and esophageal stricture for which she's had to have stretched in the past. She had an EKG at her last office visit that showed nonspecific ST changes unchanged from her last EKG cardiology and 2012. She also has a history of anxiety for which she's taking Effexor 150 mg once daily.  Started taking her BP meds and feels better with no issues the last 3 weeks Getting cervical neck surgery at [facility] in a few weeks.   Counseled on cessation for less than 10 minutes.  Thinks she can try a patch  Patient also reports a history of fibromyalgia. She has multiple joint pain today that is worsened over the last several days. Denies any increase in stress.  ROS: Denies fever, malais, weight loss, blurry vision, decreased visual acuity, cough, sputum, SOB, hemoptysis, pleuritic pain, palpitaitons, heartburn, abdominal pain, melena, lower extremity edema, claudication, or rash.  All other systems reviewed and negative   General: Affect appropriate Healthy:  appears stated age HEENT: normal Neck supple with no adenopathy JVP normal no bruits no thyromegaly Lungs clear with no wheezing and good diaphragmatic motion Heart:  S1/S2 2/6 SEM murmur,rub, gallop or click PMI normal Abdomen: benighn, BS positve, no tenderness, no AAA no bruit.  No HSM or HJR Distal pulses intact with no bruits No edema Neuro non-focal Skin warm and dry No muscular weakness  Medications Current Outpatient Prescriptions  Medication Sig Dispense Refill  . ALPRAZolam (XANAX) 0.5 MG tablet TAKE 1 TABLET  EVERY DAY AS NEEDED  90 tablet  0  . aspirin 81 MG tablet Take 81 mg by mouth daily.        . diazepam (VALIUM) 5 MG tablet       . HYDROcodone-acetaminophen (LORTAB) 7.5-500 MG per tablet TAKE 1 TABLET EVERY 8 HOURS AS NEEDED FOR PAIN  90 tablet  0  . nebivolol (BYSTOLIC) 10 MG tablet Take 10 mg by mouth 2 (two) times daily.      . rosuvastatin (CRESTOR) 20 MG tablet Take 1 tablet (20 mg total) by mouth 2 (two) times a week.  90 tablet  3  . venlafaxine XR (EFFEXOR-XR) 150 MG 24 hr capsule TAKE ONE CAPSULE BY MOUTH EVERY DAY  30 capsule  6  . methylPREDNIsolone (MEDROL DOSPACK) 4 MG tablet follow package directions  21 tablet  0   No current facility-administered medications for this visit.    Allergies Codeine and Sulfonamide derivatives  Family History: Family History  Problem Relation Age of Onset  . Dementia Mother   . Heart attack Father   . Coronary artery disease Father   . Cancer Brother     esophageal  . Coronary artery disease Paternal Aunt   . Coronary artery disease Paternal Grandmother   . Aneurysm Brother     aortic    Social History: History   Social History  . Marital Status: Single    Spouse Name: N/A    Number of Children: N/A  . Years of Education: N/A   Occupational History  . Not on file.  Social History Main Topics  . Smoking status: Current Every Day Smoker -- 1.00 packs/day    Types: Cigarettes  . Smokeless tobacco: Never Used  . Alcohol Use: No  . Drug Use: No  . Sexually Active: Not on file   Other Topics Concern  . Not on file   Social History Narrative  . No narrative on file    Electrocardiogram:  NSR lateral T wave inversion ? IMI  No change from ECG done 10/11  Assessment and Plan

## 2012-04-23 NOTE — Assessment & Plan Note (Signed)
Likely related to smoking  F/U echo to assess RV/LV function

## 2012-04-23 NOTE — Patient Instructions (Addendum)
Your physician recommends that you schedule a follow-up appointment in:  AS NEEDED Your physician recommends that you continue on your current medications as directed. Please refer to the Current Medication list given to you today. Your physician has requested that you have an echocardiogram. Echocardiography is a painless test that uses sound waves to create images of your heart. It provides your doctor with information about the size and shape of your heart and how well your heart's chambers and valves are working. This procedure takes approximately one hour. There are no restrictions for this procedure.   

## 2012-04-23 NOTE — Assessment & Plan Note (Signed)
Likely benign systolic murmur F/U echo in light of dsypnea

## 2012-04-23 NOTE — Assessment & Plan Note (Signed)
Atypical and now resolved.  Baseline ECG abnormal and no change form 2011  Normal cath in 2011  Observe for now  [facility] to have cervical neck surgery at [facility]

## 2012-04-23 NOTE — Assessment & Plan Note (Signed)
Encouraged her to follow through with patch Has had CXR in last year that was ok

## 2012-04-30 ENCOUNTER — Other Ambulatory Visit (HOSPITAL_COMMUNITY): Payer: PRIVATE HEALTH INSURANCE

## 2012-05-01 ENCOUNTER — Other Ambulatory Visit: Payer: Self-pay | Admitting: Internal Medicine

## 2012-05-05 ENCOUNTER — Ambulatory Visit (HOSPITAL_COMMUNITY): Payer: PRIVATE HEALTH INSURANCE | Attending: Cardiovascular Disease

## 2012-05-05 DIAGNOSIS — K219 Gastro-esophageal reflux disease without esophagitis: Secondary | ICD-10-CM | POA: Insufficient documentation

## 2012-05-05 DIAGNOSIS — IMO0001 Reserved for inherently not codable concepts without codable children: Secondary | ICD-10-CM | POA: Insufficient documentation

## 2012-05-05 DIAGNOSIS — R0600 Dyspnea, unspecified: Secondary | ICD-10-CM

## 2012-05-05 DIAGNOSIS — I10 Essential (primary) hypertension: Secondary | ICD-10-CM | POA: Insufficient documentation

## 2012-05-05 DIAGNOSIS — F17200 Nicotine dependence, unspecified, uncomplicated: Secondary | ICD-10-CM | POA: Insufficient documentation

## 2012-05-05 DIAGNOSIS — R011 Cardiac murmur, unspecified: Secondary | ICD-10-CM | POA: Insufficient documentation

## 2012-05-05 DIAGNOSIS — K222 Esophageal obstruction: Secondary | ICD-10-CM | POA: Insufficient documentation

## 2012-05-05 NOTE — Progress Notes (Signed)
Echocardiogram performed.  

## 2012-05-08 ENCOUNTER — Telehealth: Payer: Self-pay | Admitting: Cardiovascular Disease

## 2012-05-08 NOTE — Telephone Encounter (Signed)
PT AWARE OF ECHO RESULTS./CY 

## 2012-05-08 NOTE — Telephone Encounter (Signed)
New Problem:    Patient called in returning Christine's call regarding her latest ECHO.  Please call back.

## 2012-05-29 ENCOUNTER — Other Ambulatory Visit: Payer: Self-pay | Admitting: Internal Medicine

## 2012-06-01 ENCOUNTER — Other Ambulatory Visit: Payer: Self-pay | Admitting: Internal Medicine

## 2012-06-15 ENCOUNTER — Telehealth: Payer: Self-pay | Admitting: Internal Medicine

## 2012-06-15 NOTE — Telephone Encounter (Signed)
Left message on machine Per dr Lovell Sheehan- dont give antibioitcs for ic-you have to treat sx. Continue with cranberry juic an d try dried cranberries, also may use azo for burning- call if not better

## 2012-06-15 NOTE — Telephone Encounter (Signed)
Patient Information:  Caller Name: [place]  Phone: [phone]  Patient: Laurie [name], Laurie [name]  Gender: Female  DOB: 1955/09/03  Age: 57 Years  PCP: Darryll Capers (Adults only)  Office Follow Up:  Does the office need to follow up with this patient?: Yes  Instructions For The Office: Patient triages to be seen.  she reports no money and no car to come to the office.  Please contact. She is aware of office policy no antibiotics with out being seen. pharmcacy [facility]  RN Note:  She states a history of interstitial cystiitis . She has not money to come to the office and no way (no car)  to the office for evalation.   Pharmacy [facility] [phone].  PLEASE CONTACT PATIENT.  Symptoms  Reason For Call & Symptoms: Patient is calling in complaints with "Intertisial cystitis".  Onset of discomfort three days ago. She is requesting antibiotic. she is having frequency, pain with urination and burning, no blood .  Awakes at night with sweat. Unsure of fever but does not feel feverish. No thermometer, dizzy and nauseated. Last UOP- 1 hour.  Described white liquid from vaginal area.  Reviewed Health History In EMR: Yes  Reviewed Medications In EMR: Yes  Reviewed Allergies In EMR: Yes  Reviewed Surgeries / Procedures: Yes  Date of Onset of Symptoms: 06/12/2012  Treatments Tried: Drinking water, ibuprofen,  Treatments Tried Worked: No  Guideline(s) Used:  Urination Pain - Female  Disposition Per Guideline:   See Today in Office  Reason For Disposition Reached:   Age > 50 years  Advice Given:  Fluids:   Drink extra fluids. Drink 8-10 glasses of liquids a day (Reason: to produce a dilute, non-irritating urine).  Cranberry Juice:   Some people think that drinking cranberry juice may help in fighting urinary tract infections. However, there is no good research that has ever proved this.  Dosage 100% Cranberry Juice: 1 oz (30 ml) twice a day.  Warm Saline SITZ Baths to Reduce  Pain:  Sit in a warm saline bath for 20 minutes to cleanse the area and to reduce pain. Add 2 oz. of table salt or baking soda to a tub of water.  Call Back If:  You become worse.  RN Overrode Recommendation:  Patient Requests Prescription  Patient triages to be seen.  she reports no money and no car to come to the office.  Please contact

## 2012-06-18 ENCOUNTER — Other Ambulatory Visit: Payer: Self-pay | Admitting: Internal Medicine

## 2012-06-29 ENCOUNTER — Other Ambulatory Visit: Payer: Self-pay | Admitting: Internal Medicine

## 2012-07-09 ENCOUNTER — Other Ambulatory Visit: Payer: Self-pay | Admitting: Internal Medicine

## 2012-07-29 ENCOUNTER — Other Ambulatory Visit: Payer: Self-pay | Admitting: Internal Medicine

## 2012-08-04 ENCOUNTER — Telehealth: Payer: Self-pay | Admitting: Internal Medicine

## 2012-08-04 NOTE — Telephone Encounter (Signed)
None available as samples-

## 2012-08-04 NOTE — Telephone Encounter (Signed)
Pt would like to know if we have 2 weeks of samples of venlafaxine XR (EFFEXOR-XR) 150 MG 24 hr capsule, or anything in the family of anti depressants. Pt doesn't get paid for 2 weeks. Do you have anything?

## 2012-08-19 ENCOUNTER — Encounter: Payer: Self-pay | Admitting: Family

## 2012-08-19 ENCOUNTER — Ambulatory Visit (INDEPENDENT_AMBULATORY_CARE_PROVIDER_SITE_OTHER): Payer: PRIVATE HEALTH INSURANCE | Admitting: Family

## 2012-08-19 VITALS — BP 130/80 | HR 68 | Wt 176.0 lb

## 2012-08-19 DIAGNOSIS — F17200 Nicotine dependence, unspecified, uncomplicated: Secondary | ICD-10-CM

## 2012-08-19 DIAGNOSIS — F329 Major depressive disorder, single episode, unspecified: Secondary | ICD-10-CM

## 2012-08-19 DIAGNOSIS — I10 Essential (primary) hypertension: Secondary | ICD-10-CM

## 2012-08-19 DIAGNOSIS — M25511 Pain in right shoulder: Secondary | ICD-10-CM

## 2012-08-19 DIAGNOSIS — M25519 Pain in unspecified shoulder: Secondary | ICD-10-CM

## 2012-08-19 MED ORDER — MELOXICAM 15 MG PO TABS: 15.0000 mg | ORAL_TABLET | Freq: Every day | ORAL | Status: DC

## 2012-08-19 NOTE — Progress Notes (Signed)
Subjective:    Patient ID: Laurie [name], female    DOB: 06-May-1955, 57 y.o.   MRN: [id number]  HPI Pt is a 57 year old white female who presents to PCP with several issues. Pt feels as though her anti-depressant medication of Effexor is "not working". States she has been on the prescription for about a year and continues to lack energy and feels sad. Pt states that previous prescription for Cymbalta was effective in managing depression symptoms, but was not financially reasonable for her. Pt also states she has had an increase in chronic joint pain, specifically to the right shoulder. States the R shoulder was swollen last week; denies any recent injury to the area. Pt also reports a whitish film on tongue x 3 months; Pt reports being a 1PPD cigarette smoker x 12 years. Denies any fevers, chills or mouth sores.    Review of Systems  Constitutional: Negative.   HENT:       White film on tongue  Eyes: Negative.   Respiratory: Negative.   Cardiovascular: Negative.  Negative for chest pain.  Gastrointestinal: Negative.   Endocrine: Negative.   Genitourinary: Negative.   Musculoskeletal: Positive for arthralgias.  Skin: Negative.   Allergic/Immunologic: Negative.   Neurological: Negative.   Hematological: Negative.   Psychiatric/Behavioral: Positive for dysphoric mood.       Past Medical History  Diagnosis Date  . IBS (irritable bowel syndrome)   . Esophageal stricture   . Chronic back pain   . Depression   . Interstitial cystitis     History   Social History  . Marital Status: Single    Spouse Name: N/A    Number of Children: N/A  . Years of Education: N/A   Occupational History  . Not on file.   Social History Main Topics  . Smoking status: Current Every Day Smoker -- 1.00 packs/day    Types: Cigarettes  . Smokeless tobacco: Never Used  . Alcohol Use: No  . Drug Use: No  . Sexually Active: Not on file   Other Topics Concern  . Not on file   Social History  Narrative  . No narrative on file    Past Surgical History  Procedure Laterality Date  . Abdominal hysterectomy    . Appendectomy    . Tonsillectomy    . Finger surgery    . Cervical laminectomy    . Cervical fusion    . Lumbar fusion    . Esophagogastroduodenoscopy      Family History  Problem Relation Age of Onset  . Dementia Mother   . Heart attack Father   . Coronary artery disease Father   . Cancer Brother     esophageal  . Coronary artery disease Paternal Aunt   . Coronary artery disease Paternal Grandmother   . Aneurysm Brother     aortic    Allergies  Allergen Reactions  . Codeine     REACTION: Vomiting  . Sulfonamide Derivatives     REACTION: Upset GI    Current Outpatient Prescriptions on File Prior to Visit  Medication Sig Dispense Refill  . ALPRAZolam (XANAX) 0.5 MG tablet TAKE 1 TABLET EVERY DAY AS NEEDED  90 tablet  1  . diazepam (VALIUM) 5 MG tablet TAKE 1 TABLET BY MOUTH 3 TIMES DAILY *MUST LAST 1 MONTH*  90 tablet  3  . HYDROcodone-acetaminophen (LORTAB) 7.5-500 MG per tablet TAKE 1 TABLET EVERY 8 HOURS AS NEEDED FOR PAIN  90 tablet  0  .  HYDROcodone-acetaminophen (NORCO) 7.5-325 MG per tablet TAKE 1 TABLET BY MOUTH EVERY 8 HOURS AS NEEDED FOR PAIN  90 tablet  3  . nebivolol (BYSTOLIC) 10 MG tablet Take 10 mg by mouth 2 (two) times daily.      Marland Kitchen tiZANidine (ZANAFLEX) 4 MG tablet TAKE 1 TABLET BY MOUTH TWICE A DAY  60 tablet  3  . venlafaxine XR (EFFEXOR-XR) 150 MG 24 hr capsule TAKE ONE CAPSULE BY MOUTH EVERY DAY  30 capsule  6  . aspirin 81 MG tablet Take 81 mg by mouth daily.        . methylPREDNIsolone (MEDROL DOSPACK) 4 MG tablet follow package directions  21 tablet  0  . rosuvastatin (CRESTOR) 20 MG tablet Take 1 tablet (20 mg total) by mouth 2 (two) times a week.  90 tablet  3   No current facility-administered medications on file prior to visit.    BP 130/80  Pulse 68  Wt 176 lb (79.833 kg)  BMI 24.89 kg/m2  SpO2 98%chart Objective:    Physical Exam  Constitutional: She is oriented to person, place, and time. She appears well-developed and well-nourished.  HENT:  Head: Normocephalic and atraumatic.  Brownish discoloration to tongue  Eyes: Conjunctivae are normal. Pupils are equal, round, and reactive to light.  Neck: Normal range of motion.  Cardiovascular: Normal rate and regular rhythm.   Pulmonary/Chest: Effort normal and breath sounds normal.  Abdominal: Soft. Bowel sounds are normal.  Musculoskeletal: She exhibits tenderness.  Tenderness with palpation to R shoulder  Neurological: She is alert and oriented to person, place, and time.  Skin: Skin is warm and dry.          Assessment & Plan:  1. Depression 2. Shoulder Pain 3. Tobacco Use 4. Hypertension  Pt states she will continue on Effexor for now, financial assistance information has been provided to pt to assist with possibly resuming Cymbalta. Pt given IA steroid shot to right shoulder, instructed if pain persist, more recent xray imaging will be required to thoroughly evaluate the issue. Pt instructed on the need for tobacco sensation, to possibly eliminate the discoloration present on her tongue. Pt was also given samples of Bystolic to manage hypertension, since she states she is currently out of this medication. Pt instructed to follow up with PCP with any questions/concerns.   Note by Davonna Belling, FNP Student

## 2012-08-26 ENCOUNTER — Other Ambulatory Visit: Payer: Self-pay | Admitting: Internal Medicine

## 2012-10-23 ENCOUNTER — Other Ambulatory Visit: Payer: Self-pay | Admitting: Internal Medicine

## 2012-11-02 ENCOUNTER — Other Ambulatory Visit: Payer: Self-pay | Admitting: Internal Medicine

## 2012-11-16 ENCOUNTER — Other Ambulatory Visit: Payer: Self-pay | Admitting: Internal Medicine

## 2012-11-18 ENCOUNTER — Ambulatory Visit (INDEPENDENT_AMBULATORY_CARE_PROVIDER_SITE_OTHER): Payer: PRIVATE HEALTH INSURANCE | Admitting: Family

## 2012-11-18 ENCOUNTER — Encounter: Payer: Self-pay | Admitting: Family

## 2012-11-18 VITALS — BP 148/82 | HR 62 | Wt 177.0 lb

## 2012-11-18 DIAGNOSIS — N390 Urinary tract infection, site not specified: Secondary | ICD-10-CM

## 2012-11-18 DIAGNOSIS — R319 Hematuria, unspecified: Secondary | ICD-10-CM

## 2012-11-18 DIAGNOSIS — Z23 Encounter for immunization: Secondary | ICD-10-CM

## 2012-11-18 DIAGNOSIS — R300 Dysuria: Secondary | ICD-10-CM

## 2012-11-18 LAB — POCT URINALYSIS DIPSTICK
Bilirubin, UA: NEGATIVE
Glucose, UA: NEGATIVE
Nitrite, UA: NEGATIVE

## 2012-11-18 MED ORDER — HYDROCODONE-ACETAMINOPHEN 7.5-325 MG PO TABS: ORAL_TABLET | ORAL | Status: DC

## 2012-11-18 MED ORDER — CIPROFLOXACIN HCL 500 MG PO TABS: 500.0000 mg | ORAL_TABLET | Freq: Two times a day (BID) | ORAL | Status: DC

## 2012-11-18 NOTE — Patient Instructions (Signed)
Urinary Tract Infection  Urinary tract infections (UTIs) can develop anywhere along your urinary tract. Your urinary tract is your body's drainage system for removing wastes and extra water. Your urinary tract includes two kidneys, two ureters, a bladder, and a urethra. Your kidneys are a pair of bean-shaped organs. Each kidney is about the size of your fist. They are located below your ribs, one on each side of your spine.  CAUSES  Infections are caused by microbes, which are microscopic organisms, including fungi, viruses, and bacteria. These organisms are so small that they can only be seen through a microscope. Bacteria are the microbes that most commonly cause UTIs.  SYMPTOMS   Symptoms of UTIs may vary by age and gender of the patient and by the location of the infection. Symptoms in young women typically include a frequent and intense urge to urinate and a painful, burning feeling in the bladder or urethra during urination. Older women and men are more likely to be tired, shaky, and weak and have muscle aches and abdominal pain. A fever may mean the infection is in your kidneys. Other symptoms of a kidney infection include pain in your back or sides below the ribs, nausea, and vomiting.  DIAGNOSIS  To diagnose a UTI, your caregiver will ask you about your symptoms. Your caregiver also will ask to provide a urine sample. The urine sample will be tested for bacteria and white blood cells. White blood cells are made by your body to help fight infection.  TREATMENT   Typically, UTIs can be treated with medication. Because most UTIs are caused by a bacterial infection, they usually can be treated with the use of antibiotics. The choice of antibiotic and length of treatment depend on your symptoms and the type of bacteria causing your infection.  HOME CARE INSTRUCTIONS   If you were prescribed antibiotics, take them exactly as your caregiver instructs you. Finish the medication even if you feel better after you  have only taken some of the medication.   Drink enough water and fluids to keep your urine clear or pale yellow.   Avoid caffeine, tea, and carbonated beverages. They tend to irritate your bladder.   Empty your bladder often. Avoid holding urine for long periods of time.   Empty your bladder before and after sexual intercourse.   After a bowel movement, women should cleanse from front to back. Use each tissue only once.  SEEK MEDICAL CARE IF:    You have back pain.   You develop a fever.   Your symptoms do not begin to resolve within 3 days.  SEEK IMMEDIATE MEDICAL CARE IF:    You have severe back pain or lower abdominal pain.   You develop chills.   You have nausea or vomiting.   You have continued burning or discomfort with urination.  MAKE SURE YOU:    Understand these instructions.   Will watch your condition.   Will get help right away if you are not doing well or get worse.  Document Released: 11/07/2004 Document Revised: 07/30/2011 Document Reviewed: 03/08/2011  [employer] Patient Information 2014 [employer].

## 2012-11-18 NOTE — Progress Notes (Signed)
Subjective:    Patient ID: Laurie [name], female    DOB: 03-Oct-1955, 57 y.o.   MRN: [id number]  HPI  57 year old white female, smoker, patient of Dr. Lovell Sheehan is in today for complaints of frequency, urinary urgency, burning x3 days. Reports one day and seeing blood in her urine. Denies any abdominal pain or back pain. She has a history of hypertension interstitial cystitis.  Review of Systems  Constitutional: Negative.   Respiratory: Negative.   Cardiovascular: Negative.   Gastrointestinal: Negative.   Genitourinary: Positive for dysuria, urgency and hematuria.  Musculoskeletal: Negative.   Allergic/Immunologic: Negative.   Psychiatric/Behavioral: Negative.    Past Medical History  Diagnosis Date  . IBS (irritable bowel syndrome)   . Esophageal stricture   . Chronic back pain   . Depression   . Interstitial cystitis     History   Social History  . Marital Status: Single    Spouse Name: N/A    Number of Children: N/A  . Years of Education: N/A   Occupational History  . Not on file.   Social History Main Topics  . Smoking status: Current Every Day Smoker -- 1.00 packs/day    Types: Cigarettes  . Smokeless tobacco: Never Used  . Alcohol Use: No  . Drug Use: No  . Sexual Activity: Not on file   Other Topics Concern  . Not on file   Social History Narrative  . No narrative on file    Past Surgical History  Procedure Laterality Date  . Abdominal hysterectomy    . Appendectomy    . Tonsillectomy    . Finger surgery    . Cervical laminectomy    . Cervical fusion    . Lumbar fusion    . Esophagogastroduodenoscopy      Family History  Problem Relation Age of Onset  . Dementia Mother   . Heart attack Father   . Coronary artery disease Father   . Cancer Brother     esophageal  . Coronary artery disease Paternal Aunt   . Coronary artery disease Paternal Grandmother   . Aneurysm Brother     aortic    Allergies  Allergen Reactions  . Codeine    REACTION: Vomiting  . Sulfonamide Derivatives     REACTION: Upset GI    Current Outpatient Prescriptions on File Prior to Visit  Medication Sig Dispense Refill  . ALPRAZolam (XANAX) 0.5 MG tablet TAKE 1 TABLET BY MOUTH ONCE DAILY AS NEEDED  90 tablet  1  . aspirin 81 MG tablet Take 81 mg by mouth daily.        . diazepam (VALIUM) 5 MG tablet TAKE 1 TABLET 3 TIMES A DAY MUST LAST 1 MONTH  90 tablet  3  . nebivolol (BYSTOLIC) 10 MG tablet Take 10 mg by mouth 2 (two) times daily.      Marland Kitchen tiZANidine (ZANAFLEX) 4 MG tablet TAKE 1 TABLET BY MOUTH TWICE A DAY  60 tablet  3  . venlafaxine XR (EFFEXOR-XR) 150 MG 24 hr capsule TAKE 1 CAPSULE DAILY  30 capsule  6  . meloxicam (MOBIC) 15 MG tablet Take 1 tablet (15 mg total) by mouth daily.  30 tablet  0  . methylPREDNIsolone (MEDROL DOSPACK) 4 MG tablet follow package directions  21 tablet  0  . rosuvastatin (CRESTOR) 20 MG tablet Take 1 tablet (20 mg total) by mouth 2 (two) times a week.  90 tablet  3   No current facility-administered medications on  file prior to visit.    BP 148/82  Pulse 62  Wt 177 lb (80.287 kg)  BMI 25.03 kg/m2chart    Objective:   Physical Exam  Constitutional: She appears well-developed and well-nourished.  Neck: Normal range of motion. Neck supple.  Cardiovascular: Normal rate, regular rhythm and normal heart sounds.   Pulmonary/Chest: Effort normal and breath sounds normal.  Abdominal: Soft. Bowel sounds are normal.  Neurological: She is alert.  Skin: Skin is warm and dry.  Psychiatric: She has a normal mood and affect.          Assessment & Plan:  Assessment: 1. Urinary tract infection 2. Urinary frequency 3. Interstitial cystitis 4. Hypertension  Plan: Cipro 500 mg one tablet twice a day x7 days. Patient currently off with any questions or concerns here recheck scheduled, and has needed.

## 2012-11-19 ENCOUNTER — Telehealth: Payer: Self-pay | Admitting: Internal Medicine

## 2012-11-19 NOTE — Telephone Encounter (Signed)
Patient Information:  Caller Name: [place]  Phone: [phone]  Patient: Laurie [name], Laurie [name]  Gender: Female  DOB: 07-04-1955  Age: 57 Years  PCP: Darryll Capers (Adults only)  Office Follow Up:  Does the office need to follow up with this patient?: Yes  Instructions For The Office: See RN Note below.  Wants to know if something can be prescribed for burning with urination.  RN Note:  In office on 11/19/12 and dx'd with bladder infection.  Placed on abx.  Wants to know if anything can be taken for the burning with urination.  C/o lower back pain, intermittent.  White vaginal discharge, states MD is aware.  Symptoms  Reason For Call & Symptoms: Urinary symptoms  Reviewed Health History In EMR: Yes  Reviewed Medications In EMR: Yes  Reviewed Allergies In EMR: Yes  Reviewed Surgeries / Procedures: Yes  Date of Onset of Symptoms: 11/16/2012  Treatments Tried: Ibuprofen  Treatments Tried Worked: Yes  Guideline(s) Used:  Urinalysis Results Follow-Up Call  Urination Pain - Female  Disposition Per Guideline:   See Today in Office  Reason For Disposition Reached:   Age > 50 years  Advice Given:  Warm Saline SITZ Baths to Reduce Pain:  Sit in a warm saline bath for 20 minutes to cleanse the area and to reduce pain. Add 2 oz. of table salt or baking soda to a tub of water.  Call Back If:  You become worse.  RN Overrode Recommendation:  Document Patient  Pt was in office on 11/18/12 with same symptoms.  Calling to see if something could be prescribed for burning upon urination.

## 2012-11-19 NOTE — Telephone Encounter (Signed)
Left message on machine Azo otc and follow instruction . Will tu rn urine orange-dont be upset- drink plenty of water and cranberry juice

## 2012-11-30 ENCOUNTER — Telehealth: Payer: Self-pay | Admitting: Internal Medicine

## 2012-11-30 NOTE — Telephone Encounter (Signed)
Pt will wait until appt on Thursday

## 2012-11-30 NOTE — Telephone Encounter (Signed)
I saw patient for UTI. OV if she has concerns of cough.

## 2012-11-30 NOTE — Telephone Encounter (Signed)
Pt did not mention cough and congestion to me neither is it documented in the office note, however, I will forward message to [facility].  Please advise

## 2012-11-30 NOTE — Telephone Encounter (Signed)
Pt was seen on 11/18/12 for chest congestion and hematuria. Pt would like cough med call into [facility]. Pt has tried robitussin and mucinex with no relief.. Pt has cpx sch for this thursday

## 2012-12-03 ENCOUNTER — Encounter: Payer: Self-pay | Admitting: Family

## 2012-12-03 ENCOUNTER — Ambulatory Visit (INDEPENDENT_AMBULATORY_CARE_PROVIDER_SITE_OTHER): Payer: PRIVATE HEALTH INSURANCE | Admitting: Family

## 2012-12-03 ENCOUNTER — Other Ambulatory Visit (HOSPITAL_COMMUNITY)
Admission: RE | Admit: 2012-12-03 | Discharge: 2012-12-03 | Disposition: A | Discharge status: Home / Self Care | Payer: Medicare Other | Source: Ambulatory Visit | Attending: Family | Admitting: Family

## 2012-12-03 ENCOUNTER — Ambulatory Visit (INDEPENDENT_AMBULATORY_CARE_PROVIDER_SITE_OTHER)
Admission: RE | Admit: 2012-12-03 | Discharge: 2012-12-03 | Disposition: A | Discharge status: Home / Self Care | Payer: PRIVATE HEALTH INSURANCE | Source: Ambulatory Visit | Attending: Family | Admitting: Family

## 2012-12-03 VITALS — BP 128/74 | HR 81 | Ht 69.75 in | Wt 175.0 lb

## 2012-12-03 DIAGNOSIS — J209 Acute bronchitis, unspecified: Secondary | ICD-10-CM

## 2012-12-03 DIAGNOSIS — N760 Acute vaginitis: Secondary | ICD-10-CM | POA: Insufficient documentation

## 2012-12-03 DIAGNOSIS — Z1231 Encounter for screening mammogram for malignant neoplasm of breast: Secondary | ICD-10-CM

## 2012-12-03 DIAGNOSIS — Z0000 Encounter for general adult medical examination without abnormal findings: Secondary | ICD-10-CM

## 2012-12-03 DIAGNOSIS — Z124 Encounter for screening for malignant neoplasm of cervix: Secondary | ICD-10-CM

## 2012-12-03 DIAGNOSIS — Z01419 Encounter for gynecological examination (general) (routine) without abnormal findings: Secondary | ICD-10-CM | POA: Insufficient documentation

## 2012-12-03 LAB — LIPID PANEL
Cholesterol: 236 mg/dL — ABNORMAL HIGH (ref 0–200)
HDL: 28.5 mg/dL — ABNORMAL LOW (ref >39.00)
Triglycerides: 208 mg/dL — ABNORMAL HIGH (ref 0.0–149.0)
VLDL: 41.6 mg/dL — ABNORMAL HIGH (ref 0.0–40.0)

## 2012-12-03 LAB — BASIC METABOLIC PANEL
BUN: 14 mg/dL (ref 6–23)
CO2: 29 mEq/L (ref 19–32)
GFR: 75.04 mL/min (ref >60.00)
Potassium: 4.2 mEq/L (ref 3.5–5.1)
Sodium: 140 mEq/L (ref 135–145)

## 2012-12-03 LAB — CBC WITH DIFFERENTIAL/PLATELET
Basophils Relative: 0.5 % (ref 0.0–3.0)
Eosinophils Relative: 3 % (ref 0.0–5.0)
HCT: 38.6 % (ref 36.0–46.0)
Lymphocytes Relative: 16.7 % (ref 12.0–46.0)
Lymphs Abs: 1.8 10*3/uL (ref 0.7–4.0)
Monocytes Absolute: 0.6 10*3/uL (ref 0.1–1.0)
Monocytes Relative: 5.8 % (ref 3.0–12.0)
Neutro Abs: 8.2 10*3/uL — ABNORMAL HIGH (ref 1.4–7.7)
Neutrophils Relative %: 74 % (ref 43.0–77.0)
Platelets: 276 10*3/uL (ref 150.0–400.0)
WBC: 11.1 10*3/uL — ABNORMAL HIGH (ref 4.5–10.5)

## 2012-12-03 LAB — HEPATIC FUNCTION PANEL
Albumin: 3.7 g/dL (ref 3.5–5.2)
Alkaline Phosphatase: 71 U/L (ref 39–117)
Total Bilirubin: 0.4 mg/dL (ref 0.3–1.2)
Total Protein: 7.3 g/dL (ref 6.0–8.3)

## 2012-12-03 LAB — TSH: TSH: 1.73 u[IU]/mL (ref 0.35–5.50)

## 2012-12-03 LAB — LDL CHOLESTEROL, DIRECT: Direct LDL: 177.9 mg/dL

## 2012-12-03 MED ORDER — FLUCONAZOLE 150 MG PO TABS: 150.0000 mg | ORAL_TABLET | Freq: Once | ORAL | Status: DC

## 2012-12-03 MED ORDER — PREDNISONE 20 MG PO TABS: ORAL_TABLET | ORAL | Status: AC

## 2012-12-03 NOTE — Patient Instructions (Signed)
1. Chest Xray at [facility] on [address] 2. Prednisone from pharmacy  Bronchitis Bronchitis is the body's way of reacting to injury and/or infection (inflammation) of the bronchi. Bronchi are the air tubes that extend from the windpipe into the lungs. If the inflammation becomes severe, it may cause shortness of breath. CAUSES  Inflammation may be caused by:  A virus.  Germs (bacteria).  Dust.  Allergens.  Pollutants and many other irritants. The cells lining the bronchial tree are covered with tiny hairs (cilia). These constantly beat upward, away from the lungs, toward the mouth. This keeps the lungs free of pollutants. When these cells become too irritated and are unable to do their job, mucus begins to develop. This causes the characteristic cough of bronchitis. The cough clears the lungs when the cilia are unable to do their job. Without either of these protective mechanisms, the mucus would settle in the lungs. Then you would develop pneumonia. Smoking is a common cause of bronchitis and can contribute to pneumonia. Stopping this habit is the single most important thing you can do to help yourself. TREATMENT   Your caregiver may prescribe an antibiotic if the cough is caused by bacteria. Also, medicines that open up your airways make it easier to breathe. Your caregiver may also recommend or prescribe an expectorant. It will loosen the mucus to be coughed up. Only take over-the-counter or prescription medicines for pain, discomfort, or fever as directed by your caregiver.  Removing whatever causes the problem (smoking, for example) is critical to preventing the problem from getting worse.  Cough suppressants may be prescribed for relief of cough symptoms.  Inhaled medicines may be prescribed to help with symptoms now and to help prevent problems from returning.  For those with recurrent (chronic) bronchitis, there may be a need for steroid medicines. SEEK IMMEDIATE MEDICAL  CARE IF:   During treatment, you develop more pus-like mucus (purulent sputum).  You have a fever.  Your baby is older than 3 months with a rectal temperature of 102 F (38.9 C) or higher.  Your baby is 21 months old or younger with a rectal temperature of 100.4 F (38 C) or higher.  You become progressively more ill.  You have increased difficulty breathing, wheezing, or shortness of breath. It is necessary to seek immediate medical care if you are elderly or sick from any other disease. MAKE SURE YOU:   Understand these instructions.  Will watch your condition.  Will get help right away if you are not doing well or get worse. Document Released: 01/28/2005 Document Revised: 04/22/2011 Document Reviewed: 12/08/2007 [facility] Patient Information 2014 [place], [place].

## 2012-12-03 NOTE — Progress Notes (Signed)
Subjective:    Patient ID: Laurie [name], female    DOB: 30-Jul-1955, 57 y.o.   MRN: [id number]  HPI 57 year old female, smoker, is in for a routine physical examination for this healthy  Female. Reviewed all health maintenance protocols including mammography colonoscopy bone density and reviewed appropriate screening labs. Her immunization history was reviewed as well as her current medications and allergies refills of her chronic medications were given and the plan for yearly health maintenance was discussed all orders and referrals were made as appropriate.   Review of Systems  Constitutional: Negative.   HENT: Negative.   Eyes: Negative.   Respiratory: Positive for cough and wheezing.   Cardiovascular: Negative.   Gastrointestinal: Negative.   Endocrine: Negative.   Genitourinary: Negative.   Musculoskeletal: Negative.   Skin: Negative.   Neurological: Negative.   Hematological: Negative.   Psychiatric/Behavioral: Negative.    Past Medical History  Diagnosis Date  . IBS (irritable bowel syndrome)   . Esophageal stricture   . Chronic back pain   . Depression   . Interstitial cystitis     History   Social History  . Marital Status: Single    Spouse Name: N/A    Number of Children: N/A  . Years of Education: N/A   Occupational History  . Not on file.   Social History Main Topics  . Smoking status: Current Every Day Smoker -- 1.00 packs/day    Types: Cigarettes  . Smokeless tobacco: Never Used  . Alcohol Use: No  . Drug Use: No  . Sexual Activity: Not on file   Other Topics Concern  . Not on file   Social History Narrative  . No narrative on file    Past Surgical History  Procedure Laterality Date  . Abdominal hysterectomy    . Appendectomy    . Tonsillectomy    . Finger surgery    . Cervical laminectomy    . Cervical fusion    . Lumbar fusion    . Esophagogastroduodenoscopy      Family History  Problem Relation Age of Onset  . Dementia  Mother   . Heart attack Father   . Coronary artery disease Father   . Cancer Brother     esophageal  . Coronary artery disease Paternal Aunt   . Coronary artery disease Paternal Grandmother   . Aneurysm Brother     aortic    Allergies  Allergen Reactions  . Codeine     REACTION: Vomiting  . Sulfonamide Derivatives     REACTION: Upset GI    Current Outpatient Prescriptions on File Prior to Visit  Medication Sig Dispense Refill  . ALPRAZolam (XANAX) 0.5 MG tablet TAKE 1 TABLET BY MOUTH ONCE DAILY AS NEEDED  90 tablet  1  . aspirin 81 MG tablet Take 81 mg by mouth daily.        . ciprofloxacin (CIPRO) 500 MG tablet Take 1 tablet (500 mg total) by mouth 2 (two) times daily.  14 tablet  0  . diazepam (VALIUM) 5 MG tablet TAKE 1 TABLET 3 TIMES A DAY MUST LAST 1 MONTH  90 tablet  3  . HYDROcodone-acetaminophen (NORCO) 7.5-325 MG per tablet TAKE 1 TABLET BY MOUTH EVERY 8 HOURS AS NEEDED FOR PAIN  90 tablet  0  . meloxicam (MOBIC) 15 MG tablet Take 1 tablet (15 mg total) by mouth daily.  30 tablet  0  . methylPREDNIsolone (MEDROL DOSPACK) 4 MG tablet follow package directions  21  tablet  0  . nebivolol (BYSTOLIC) 10 MG tablet Take 10 mg by mouth 2 (two) times daily.      Marland Kitchen tiZANidine (ZANAFLEX) 4 MG tablet TAKE 1 TABLET BY MOUTH TWICE A DAY  60 tablet  3  . venlafaxine XR (EFFEXOR-XR) 150 MG 24 hr capsule TAKE 1 CAPSULE DAILY  30 capsule  6  . rosuvastatin (CRESTOR) 20 MG tablet Take 1 tablet (20 mg total) by mouth 2 (two) times a week.  90 tablet  3   No current facility-administered medications on file prior to visit.    BP 128/74  Pulse 81  Ht 5' 9.75" (1.772 m)  Wt 175 lb (79.379 kg)  BMI 25.28 kg/m2chart    Objective:   Physical Exam  Constitutional: She is oriented to person, place, and time. She appears well-developed and well-nourished.  HENT:  Head: Normocephalic.  Right Ear: External ear normal.  Left Ear: External ear normal.  Nose: Nose normal.  Mouth/Throat:  Oropharynx is clear and moist.  Eyes: Conjunctivae and EOM are normal. Pupils are equal, round, and reactive to light.  Neck: Normal range of motion. Neck supple. No thyromegaly present.  Cardiovascular: Normal rate, regular rhythm and normal heart sounds.   Pulmonary/Chest: Effort normal. She has wheezes.  Abdominal: Soft. Bowel sounds are normal. She exhibits no distension. There is no tenderness. There is no rebound.  Genitourinary: Uterus normal. Guaiac negative stool. Vaginal discharge found.  Thick curdy vaginal discharge  Musculoskeletal: Normal range of motion. She exhibits no edema and no tenderness.  Neurological: She is alert and oriented to person, place, and time. She has normal reflexes. She displays normal reflexes. No cranial nerve deficit. Coordination normal.  Skin: Skin is warm and dry.  Psychiatric: She has a normal mood and affect.          Assessment & Plan:  Assessment: 1. Complete physical exam 2. Pap and pelvic 3. Tobacco abuse 4. Bronchitis 5. Vaginal candida  Plan: Labs sent. Pap smear sent. Mammogram ordered. Her for colonoscopy and endoscopy. Encouraged healthy diet, exercise, monthly self breast exams. Diflucan sent to the pharmacy. Prednisone 60x3, 40x3, 20x3. Rest. Drink clear fluids.

## 2012-12-08 ENCOUNTER — Telehealth: Payer: Self-pay | Admitting: Internal Medicine

## 2012-12-08 NOTE — Telephone Encounter (Signed)
Pt would like results of pap done 10.23

## 2012-12-08 NOTE — Telephone Encounter (Signed)
See results note. 

## 2012-12-09 ENCOUNTER — Encounter: Payer: Self-pay | Admitting: Gastroenterology

## 2012-12-14 ENCOUNTER — Other Ambulatory Visit: Payer: Self-pay | Admitting: Internal Medicine

## 2012-12-22 ENCOUNTER — Telehealth: Payer: Self-pay | Admitting: Internal Medicine

## 2012-12-22 NOTE — Telephone Encounter (Signed)
Patient Information:  Caller Name: [place]  Phone: [phone]  Patient: Laurie [name], Laurie [name]  Gender: Female  DOB: [birth year]  Age: 57 Years  PCP: Darryll Capers (Adults only)  Office Follow Up:  Does the office need to follow up with this patient?: No  Instructions For The Office: N/A   Symptoms  Reason For Call & Symptoms: Pt states she was seen in the office and diagnosed with UTI and completed the antibiotic.  Pt states she was seen last week for physical.  Pt states she has UTI symptoms, pain, burning.  Reviewed Health History In EMR: Yes  Reviewed Medications In EMR: Yes  Reviewed Allergies In EMR: Yes  Reviewed Surgeries / Procedures: Yes  Date of Onset of Symptoms: 12/20/2012  Guideline(s) Used:  Urination Pain - Female  Disposition Per Guideline:   Go to Office Now  Reason For Disposition Reached:   Severe pain with urination  Advice Given:  N/A  Patient Will Follow Care Advice:  YES  Appointment Scheduled:  12/23/2012 13:30:00 Appointment Scheduled Provider:  Adline Mango [facility])

## 2012-12-23 ENCOUNTER — Ambulatory Visit (INDEPENDENT_AMBULATORY_CARE_PROVIDER_SITE_OTHER): Payer: PRIVATE HEALTH INSURANCE | Admitting: Family

## 2012-12-23 ENCOUNTER — Ambulatory Visit: Payer: PRIVATE HEALTH INSURANCE

## 2012-12-23 ENCOUNTER — Encounter: Payer: Self-pay | Admitting: Family

## 2012-12-23 VITALS — BP 122/76 | HR 76 | Wt 175.0 lb

## 2012-12-23 DIAGNOSIS — N390 Urinary tract infection, site not specified: Secondary | ICD-10-CM

## 2012-12-23 DIAGNOSIS — R300 Dysuria: Secondary | ICD-10-CM

## 2012-12-23 LAB — POCT URINALYSIS DIPSTICK
Blood, UA: NEGATIVE
Glucose, UA: NEGATIVE
Ketones, UA: NEGATIVE
Spec Grav, UA: 1.02
Urobilinogen, UA: 0.2

## 2012-12-23 MED ORDER — NITROFURANTOIN MONOHYD MACRO 100 MG PO CAPS: 100.0000 mg | ORAL_CAPSULE | Freq: Two times a day (BID) | ORAL | Status: DC

## 2012-12-23 MED ORDER — HYDROCODONE-ACETAMINOPHEN 7.5-325 MG PO TABS: ORAL_TABLET | ORAL | Status: DC

## 2012-12-23 NOTE — Progress Notes (Signed)
Subjective:    Patient ID: Laurie [name], female    DOB: 1955/08/12, 57 y.o.   MRN: [id number]  HPI 58 year old white female, smoker he is in today with complaints of dysuria, nausea, urinary frequency and urgency x2 days and worsening. She is sexually active although female partner. Denies any concerns sexual transmitted diseases.   Review of Systems  Constitutional: Negative.   HENT: Negative.   Respiratory: Negative.   Cardiovascular: Negative.   Genitourinary: Positive for dysuria, urgency and hematuria.  Musculoskeletal: Negative.   Skin: Negative.   Psychiatric/Behavioral: Negative.    Past Medical History  Diagnosis Date  . IBS (irritable bowel syndrome)   . Esophageal stricture   . Chronic back pain   . Depression   . Interstitial cystitis     History   Social History  . Marital Status: Single    Spouse Name: N/A    Number of Children: N/A  . Years of Education: N/A   Occupational History  . Not on file.   Social History Main Topics  . Smoking status: Current Every Day Smoker -- 1.00 packs/day    Types: Cigarettes  . Smokeless tobacco: Never Used  . Alcohol Use: No  . Drug Use: No  . Sexual Activity: Not on file   Other Topics Concern  . Not on file   Social History Narrative  . No narrative on file    Past Surgical History  Procedure Laterality Date  . Abdominal hysterectomy    . Appendectomy    . Tonsillectomy    . Finger surgery    . Cervical laminectomy    . Cervical fusion    . Lumbar fusion    . Esophagogastroduodenoscopy      Family History  Problem Relation Age of Onset  . Dementia Mother   . Heart attack Father   . Coronary artery disease Father   . Cancer Brother     esophageal  . Coronary artery disease Paternal Aunt   . Coronary artery disease Paternal Grandmother   . Aneurysm Brother     aortic    Allergies  Allergen Reactions  . Codeine     REACTION: Vomiting  . Sulfonamide Derivatives     REACTION: Upset GI     Current Outpatient Prescriptions on File Prior to Visit  Medication Sig Dispense Refill  . ALPRAZolam (XANAX) 0.5 MG tablet TAKE 1 TABLET BY MOUTH ONCE DAILY AS NEEDED  90 tablet  1  . aspirin 81 MG tablet Take 81 mg by mouth daily.        . ciprofloxacin (CIPRO) 500 MG tablet Take 1 tablet (500 mg total) by mouth 2 (two) times daily.  14 tablet  0  . diazepam (VALIUM) 5 MG tablet TAKE 1 TABLET 3 TIMES A DAY MUST LAST 1 MONTH  90 tablet  3  . fluconazole (DIFLUCAN) 150 MG tablet Take 1 tablet (150 mg total) by mouth once.  1 tablet  0  . meloxicam (MOBIC) 15 MG tablet Take 1 tablet (15 mg total) by mouth daily.  30 tablet  0  . methylPREDNIsolone (MEDROL DOSPACK) 4 MG tablet follow package directions  21 tablet  0  . nebivolol (BYSTOLIC) 10 MG tablet Take 10 mg by mouth 2 (two) times daily.      Marland Kitchen tiZANidine (ZANAFLEX) 4 MG tablet TAKE 1 TABLET BY MOUTH TWICE A DAY  60 tablet  3  . venlafaxine XR (EFFEXOR-XR) 150 MG 24 hr capsule TAKE 1 CAPSULE DAILY  30 capsule  6  . rosuvastatin (CRESTOR) 20 MG tablet Take 1 tablet (20 mg total) by mouth 2 (two) times a week.  90 tablet  3   No current facility-administered medications on file prior to visit.    BP 122/76  Pulse 76  Wt 175 lb (79.379 kg)chart    Objective:   Physical Exam  Constitutional: She is oriented to person, place, and time. She appears well-developed and well-nourished.  Neck: Normal range of motion. Neck supple.  Cardiovascular: Normal rate, regular rhythm and normal heart sounds.   Pulmonary/Chest: Effort normal and breath sounds normal.  Abdominal: Soft. Bowel sounds are normal.  Neurological: She is alert and oriented to person, place, and time.  Skin: Skin is warm and dry.  Psychiatric: She has a normal mood and affect.          Assessment & Plan:  Assessment: 1. Dysuria 2. UTI   Plan: Urine culture sent. The patient the results. From 100 mg twice a day x7 days. Drink plenty of fluids. If the Vioxx if  symptoms worsen or persist. Recheck as scheduled, and as needed.

## 2012-12-23 NOTE — Patient Instructions (Signed)
Urinary Tract Infection  Urinary tract infections (UTIs) can develop anywhere along your urinary tract. Your urinary tract is your body's drainage system for removing wastes and extra water. Your urinary tract includes two kidneys, two ureters, a bladder, and a urethra. Your kidneys are a pair of bean-shaped organs. Each kidney is about the size of your fist. They are located below your ribs, one on each side of your spine.  CAUSES  Infections are caused by microbes, which are microscopic organisms, including fungi, viruses, and bacteria. These organisms are so small that they can only be seen through a microscope. Bacteria are the microbes that most commonly cause UTIs.  SYMPTOMS   Symptoms of UTIs may vary by age and gender of the patient and by the location of the infection. Symptoms in young women typically include a frequent and intense urge to urinate and a painful, burning feeling in the bladder or urethra during urination. Older women and men are more likely to be tired, shaky, and weak and have muscle aches and abdominal pain. A fever may mean the infection is in your kidneys. Other symptoms of a kidney infection include pain in your back or sides below the ribs, nausea, and vomiting.  DIAGNOSIS  To diagnose a UTI, your caregiver will ask you about your symptoms. Your caregiver also will ask to provide a urine sample. The urine sample will be tested for bacteria and white blood cells. White blood cells are made by your body to help fight infection.  TREATMENT   Typically, UTIs can be treated with medication. Because most UTIs are caused by a bacterial infection, they usually can be treated with the use of antibiotics. The choice of antibiotic and length of treatment depend on your symptoms and the type of bacteria causing your infection.  HOME CARE INSTRUCTIONS   If you were prescribed antibiotics, take them exactly as your caregiver instructs you. Finish the medication even if you feel better after you  have only taken some of the medication.   Drink enough water and fluids to keep your urine clear or pale yellow.   Avoid caffeine, tea, and carbonated beverages. They tend to irritate your bladder.   Empty your bladder often. Avoid holding urine for long periods of time.   Empty your bladder before and after sexual intercourse.   After a bowel movement, women should cleanse from front to back. Use each tissue only once.  SEEK MEDICAL CARE IF:    You have back pain.   You develop a fever.   Your symptoms do not begin to resolve within 3 days.  SEEK IMMEDIATE MEDICAL CARE IF:    You have severe back pain or lower abdominal pain.   You develop chills.   You have nausea or vomiting.   You have continued burning or discomfort with urination.  MAKE SURE YOU:    Understand these instructions.   Will watch your condition.   Will get help right away if you are not doing well or get worse.  Document Released: 11/07/2004 Document Revised: 07/30/2011 Document Reviewed: 03/08/2011  [employer] Patient Information 2014 [employer].

## 2012-12-23 NOTE — Progress Notes (Signed)
Pre-visit discussion using our clinic review tool. No additional management support is needed unless otherwise documented below in the visit note.  

## 2012-12-25 ENCOUNTER — Ambulatory Visit: Payer: PRIVATE HEALTH INSURANCE | Admitting: Gastroenterology

## 2012-12-26 LAB — URINE CULTURE: Culture: >=100000

## 2013-01-05 ENCOUNTER — Telehealth: Payer: Self-pay | Admitting: Internal Medicine

## 2013-01-05 NOTE — Telephone Encounter (Signed)
Samples outfront 

## 2013-01-05 NOTE — Telephone Encounter (Signed)
Pt would like samples of bystolic 10 mg twice a day

## 2013-01-21 ENCOUNTER — Telehealth: Payer: Self-pay | Admitting: Internal Medicine

## 2013-01-21 NOTE — Telephone Encounter (Signed)
Pt would like rx for HYDROcodone-acetaminophen (NORCO) 7.5-325 MG per tablet

## 2013-01-22 ENCOUNTER — Ambulatory Visit: Payer: PRIVATE HEALTH INSURANCE

## 2013-01-22 ENCOUNTER — Other Ambulatory Visit: Payer: Self-pay | Admitting: *Deleted

## 2013-01-22 MED ORDER — HYDROCODONE-ACETAMINOPHEN 7.5-325 MG PO TABS: ORAL_TABLET | ORAL | Status: DC

## 2013-01-22 NOTE — Telephone Encounter (Signed)
Done and went to lab

## 2013-02-19 ENCOUNTER — Ambulatory Visit: Payer: PRIVATE HEALTH INSURANCE | Admitting: Gastroenterology

## 2013-02-22 ENCOUNTER — Other Ambulatory Visit: Payer: PRIVATE HEALTH INSURANCE

## 2013-02-23 ENCOUNTER — Other Ambulatory Visit: Payer: Self-pay | Admitting: Internal Medicine

## 2013-02-24 ENCOUNTER — Other Ambulatory Visit: Payer: Self-pay | Admitting: *Deleted

## 2013-02-24 ENCOUNTER — Telehealth: Payer: Self-pay | Admitting: Internal Medicine

## 2013-02-24 MED ORDER — NEBIVOLOL HCL 10 MG PO TABS: 10.0000 mg | ORAL_TABLET | Freq: Two times a day (BID) | ORAL | Status: DC

## 2013-02-24 NOTE — Telephone Encounter (Signed)
Done

## 2013-02-24 NOTE — Telephone Encounter (Signed)
Pt request rx of nebivolol (BYSTOLIC) 10 MG tablet  1/ bid Pt states she has always had samples, so will need new rx [facility]

## 2013-02-27 ENCOUNTER — Other Ambulatory Visit: Payer: Self-pay | Admitting: Internal Medicine

## 2013-03-01 ENCOUNTER — Ambulatory Visit: Payer: PRIVATE HEALTH INSURANCE | Admitting: Internal Medicine

## 2013-03-02 ENCOUNTER — Ambulatory Visit: Payer: PRIVATE HEALTH INSURANCE | Admitting: Family

## 2013-03-03 ENCOUNTER — Ambulatory Visit (INDEPENDENT_AMBULATORY_CARE_PROVIDER_SITE_OTHER): Payer: Medicare Other | Admitting: Family

## 2013-03-03 ENCOUNTER — Encounter: Payer: Self-pay | Admitting: Family

## 2013-03-03 VITALS — BP 172/98 | HR 111 | Wt 175.0 lb

## 2013-03-03 DIAGNOSIS — M25519 Pain in unspecified shoulder: Secondary | ICD-10-CM

## 2013-03-03 DIAGNOSIS — R350 Frequency of micturition: Secondary | ICD-10-CM

## 2013-03-03 DIAGNOSIS — I10 Essential (primary) hypertension: Secondary | ICD-10-CM

## 2013-03-03 DIAGNOSIS — M25511 Pain in right shoulder: Secondary | ICD-10-CM

## 2013-03-03 DIAGNOSIS — F411 Generalized anxiety disorder: Secondary | ICD-10-CM

## 2013-03-03 LAB — POCT URINALYSIS DIPSTICK
Bilirubin, UA: NEGATIVE
Blood, UA: NEGATIVE
Glucose, UA: NEGATIVE
Ketones, UA: NEGATIVE
Leukocytes, UA: NEGATIVE
Nitrite, UA: NEGATIVE
PH UA: 5.5
SPEC GRAV UA: 1.01
Urobilinogen, UA: 0.2

## 2013-03-03 MED ORDER — LISINOPRIL 20 MG PO TABS: 20.0000 mg | ORAL_TABLET | Freq: Every day | ORAL | Status: DC

## 2013-03-03 MED ORDER — MELOXICAM 15 MG PO TABS: 15.0000 mg | ORAL_TABLET | Freq: Every day | ORAL | Status: DC

## 2013-03-03 MED ORDER — CYCLOBENZAPRINE HCL 10 MG PO TABS: 10.0000 mg | ORAL_TABLET | Freq: Three times a day (TID) | ORAL | Status: DC | PRN

## 2013-03-03 NOTE — Progress Notes (Signed)
Pre visit review using our clinic review tool, if applicable. No additional management support is needed unless otherwise documented below in the visit note. 

## 2013-03-03 NOTE — Patient Instructions (Signed)
Sodium-Controlled Diet Sodium is a mineral. It is found in many foods. Sodium may be found naturally or added during the making of a food. The most common form of sodium is salt, which is made up of sodium and chloride. Reducing your sodium intake involves changing your eating habits. The following guidelines will help you reduce the sodium in your diet:  Stop using the salt shaker.  Use salt sparingly in cooking and baking.  Substitute with sodium-free seasonings and spices.  Do not use a salt substitute (potassium chloride) without your caregiver's permission.  Include a variety of fresh, unprocessed foods in your diet.  Limit the use of processed and convenience foods that are high in sodium. USE THE FOLLOWING FOODS SPARINGLY: Breads/Starches  Commercial bread stuffing, commercial pancake or waffle mixes, coating mixes. Waffles. Croutons. Prepared (boxed or frozen) potato, rice, or noodle mixes that contain salt or sodium. Salted French fries or hash browns. Salted popcorn, breads, crackers, chips, or snack foods. Vegetables  Vegetables canned with salt or prepared in cream, butter, or cheese sauces. Sauerkraut. Tomato or vegetable juices canned with salt.  Fresh vegetables are allowed if rinsed thoroughly. Fruit  Fruit is okay to eat. Meat and Meat Substitutes  Salted or smoked meats, such as bacon or Canadian bacon, chipped or corned beef, hot dogs, salt pork, luncheon meats, pastrami, ham, or sausage. Canned or smoked fish, poultry, or meat. Processed cheese or cheese spreads, blue or Roquefort cheese. Battered or frozen fish products. Prepared spaghetti sauce. Baked beans. Reuben sandwiches. Salted nuts. Caviar. Milk  Limit buttermilk to 1 cup per week. Soups and Combination Foods  Bouillon cubes, canned or dried soups, broth, consomm. Convenience (frozen or packaged) dinners with more than 600 mg sodium. Pot pies, pizza, Asian food, fast food cheeseburgers, and specialty  sandwiches. Desserts and Sweets  Regular (salted) desserts, pie, commercial fruit snack pies, commercial snack cakes, canned puddings.  Eat desserts and sweets in moderation. Fats and Oils  Gravy mixes or canned gravy. No more than 1 to 2 tbs of salad dressing. Chip dips.  Eat fats and oils in moderation. Beverages  See those listed under the vegetables and milk groups. Condiments  Ketchup, mustard, meat sauces, salsa, regular (salted) and lite soy sauce or mustard. Dill pickles, olives, meat tenderizer. Prepared horseradish or pickle relish. Dutch-processed cocoa. Baking powder or baking soda used medicinally. Worcestershire sauce. "Light" salt. Salt substitute, unless approved by your caregiver. Document Released: 07/20/2001 Document Revised: 04/22/2011 Document Reviewed: 02/20/2009 [employer] Patient Information 2014 [employer].  

## 2013-03-03 NOTE — Progress Notes (Signed)
Subjective:    Patient ID: Laurie [name], female    DOB: 08-19-1955, 58 y.o.   MRN: [id number]  HPI  58 y.o. White female that presents to the office today for a medication follow up appointment. Pt has chief complaints of "right shoulder pain and bladder infection". Pt states that the shoulder pain occurs from the right scapula to the right shoulder and is constant, the pain is described as "tight", she states that she has had the pain for "a long time", nothing currently relieves the pain. Pt also complains of occasional bladder pain. Denies fever, fatigue, weight changes.     Review of Systems  Constitutional: Negative.   HENT: Negative.   Eyes: Negative.   Respiratory: Negative.   Cardiovascular: Negative.   Gastrointestinal: Negative.   Endocrine: Negative.   Genitourinary: Negative.   Musculoskeletal: Positive for back pain (right shoulder to back. ).  Skin: Negative.   Allergic/Immunologic: Negative.   Neurological: Negative.   Hematological: Negative.   Psychiatric/Behavioral: Negative.    Past Medical History  Diagnosis Date  . IBS (irritable bowel syndrome)   . Esophageal stricture   . Chronic back pain   . Depression   . Interstitial cystitis     History   Social History  . Marital Status: Single    Spouse Name: N/A    Number of Children: N/A  . Years of Education: N/A   Occupational History  . Not on file.   Social History Main Topics  . Smoking status: Current Every Day Smoker -- 1.00 packs/day    Types: Cigarettes  . Smokeless tobacco: Never Used  . Alcohol Use: No  . Drug Use: No  . Sexual Activity: Not on file   Other Topics Concern  . Not on file   Social History Narrative  . No narrative on file    Past Surgical History  Procedure Laterality Date  . Abdominal hysterectomy    . Appendectomy    . Tonsillectomy    . Finger surgery    . Cervical laminectomy    . Cervical fusion    . Lumbar fusion    . Esophagogastroduodenoscopy       Family History  Problem Relation Age of Onset  . Dementia Mother   . Heart attack Father   . Coronary artery disease Father   . Cancer Brother     esophageal  . Coronary artery disease Paternal Aunt   . Coronary artery disease Paternal Grandmother   . Aneurysm Brother     aortic    Allergies  Allergen Reactions  . Codeine     REACTION: Vomiting  . Sulfonamide Derivatives     REACTION: Upset GI    Current Outpatient Prescriptions on File Prior to Visit  Medication Sig Dispense Refill  . ALPRAZolam (XANAX) 0.5 MG tablet TAKE 1 TABLET BY MOUTH EVERY DAY AS NEEDED  90 tablet  1  . aspirin 81 MG tablet Take 81 mg by mouth daily.        . diazepam (VALIUM) 5 MG tablet TAKE 1 TABLET 3 TIMES A DAY MUST LAST 1 MONTH  90 tablet  3  . HYDROcodone-acetaminophen (NORCO) 7.5-325 MG per tablet TAKE 1 TABLET BY MOUTH EVERY 8 HOURS AS NEEDED FOR PAIN  90 tablet  0  . tiZANidine (ZANAFLEX) 4 MG tablet TAKE 1 TABLET BY MOUTH TWICE A DAY  60 tablet  3  . venlafaxine XR (EFFEXOR-XR) 150 MG 24 hr capsule TAKE 1 CAPSULE DAILY  30 capsule  6  . rosuvastatin (CRESTOR) 20 MG tablet Take 1 tablet (20 mg total) by mouth 2 (two) times a week.  90 tablet  3   No current facility-administered medications on file prior to visit.    BP 172/98  Pulse 111  Wt 175 lb (79.379 kg)chart      Objective:   Physical Exam  Constitutional: She is oriented to person, place, and time. She appears well-developed and well-nourished. She is active.  HENT:  Head: Normocephalic.  Cardiovascular: Normal rate, regular rhythm, S1 normal and normal heart sounds.   Pulmonary/Chest: Effort normal and breath sounds normal.  Abdominal: Soft. Bowel sounds are normal.  Musculoskeletal:       Right shoulder: She exhibits tenderness and pain.  Neurological: She is alert and oriented to person, place, and time.  Skin: Skin is warm, dry and intact.  Psychiatric: Her speech is normal. Her mood appears anxious.           Assessment & Plan:  58 y.o. White female presents to the clinic today for medication follow up.  -Hypertension: pt currently hypertensive and admits not taking Nebivolol due to being to expensive.   - Start Lisinopril 20mg  p.o. Qday -Chronic pain: Pt admits to Marijuana use, also had a past drug screen that did not show traces of current narcotics that are prescribed.   - Pt educated that controlled medications cannot be prescribed for people who are using Marijuana and are not taking controlled medicines as prescribed.   - Will start Flexeril 10mg  p.o. TID as needed for pain relief.    - Not refilling controlled substances at this time.   - Recommend pain clinic  -Bladder pain: Pt has hx of frequent bladder infections. Will get urinalysis to test for infection.  -Education: Low sodium diet, adhere to blood pressure medication schedule.   - Stop using Marijuana   - Take flexeril with food and do not drive while taking.  - Follow up in 3 weeks to recheck blood pressure and evaluate how new medication is working.

## 2013-03-05 ENCOUNTER — Telehealth: Payer: Self-pay | Admitting: Internal Medicine

## 2013-03-05 ENCOUNTER — Encounter: Payer: Self-pay | Admitting: Internal Medicine

## 2013-03-05 NOTE — Telephone Encounter (Signed)
Pt aware of results 

## 2013-03-05 NOTE — Telephone Encounter (Signed)
Pt saw np on 03-03-13 and would like ua results

## 2013-03-08 ENCOUNTER — Other Ambulatory Visit: Payer: Self-pay | Admitting: *Deleted

## 2013-03-08 ENCOUNTER — Other Ambulatory Visit: Payer: Self-pay | Admitting: Internal Medicine

## 2013-03-10 ENCOUNTER — Other Ambulatory Visit: Payer: Self-pay | Admitting: Internal Medicine

## 2013-03-12 ENCOUNTER — Other Ambulatory Visit: Payer: Self-pay | Admitting: Internal Medicine

## 2013-03-16 ENCOUNTER — Telehealth: Payer: Self-pay | Admitting: Internal Medicine

## 2013-03-16 NOTE — Telephone Encounter (Signed)
Relevant patient education mailed to patient.  

## 2013-03-17 ENCOUNTER — Telehealth: Payer: Self-pay | Admitting: Internal Medicine

## 2013-03-17 NOTE — Telephone Encounter (Signed)
Relevant patient education mailed to patient.  

## 2013-03-25 ENCOUNTER — Telehealth: Payer: Self-pay | Admitting: Internal Medicine

## 2013-03-25 ENCOUNTER — Other Ambulatory Visit: Payer: Self-pay | Admitting: *Deleted

## 2013-03-25 NOTE — Telephone Encounter (Signed)
Pt is calling trying to see if dr. Arnoldo Morale can call her in a cough syrup, pt states she has had a dry cough for about 3 months, and nothing over the counter is helping, pt states she is coughing so much that she is having pain in her ribs and chest, states she seen dr. Arnoldo Morale 3 weeks ago but did not get an rx.

## 2013-03-26 ENCOUNTER — Other Ambulatory Visit: Payer: Self-pay | Admitting: *Deleted

## 2013-03-26 MED ORDER — AZITHROMYCIN 250 MG PO TABS: 250.0000 mg | ORAL_TABLET | Freq: Every day | ORAL | Status: DC

## 2013-03-26 NOTE — Telephone Encounter (Signed)
May have z pack per dr Arnoldo Morale-  Sent to pharmacy but unable to comtact pt- ,left message with pharmacy to let her know when it is reqady

## 2013-03-31 ENCOUNTER — Other Ambulatory Visit: Payer: Self-pay | Admitting: Internal Medicine

## 2013-04-01 ENCOUNTER — Telehealth: Payer: Self-pay | Admitting: Internal Medicine

## 2013-04-01 NOTE — Telephone Encounter (Signed)
Pt request refill of HYDROcodone-acetaminophen (NORCO) 7.5-325 MG per tablet.  Pt aware bonnie and Dr Lenna Sciara out until Friday. Ok to wait until then.

## 2013-04-02 ENCOUNTER — Other Ambulatory Visit: Payer: Self-pay | Admitting: *Deleted

## 2013-04-02 MED ORDER — HYDROCODONE-ACETAMINOPHEN 7.5-325 MG PO TABS: ORAL_TABLET | ORAL | Status: DC

## 2013-04-02 NOTE — Telephone Encounter (Signed)
Per dr Arnoldo Morale may only have #30 because her toxicology lab test was neg

## 2013-04-27 ENCOUNTER — Telehealth: Payer: Self-pay | Admitting: Internal Medicine

## 2013-04-27 NOTE — Telephone Encounter (Signed)
Pt scheduled w/ dr Maudie Mercury wed am

## 2013-04-27 NOTE — Telephone Encounter (Signed)
Pt is going to need an appt to be evaluated.  Please schedule

## 2013-04-27 NOTE — Telephone Encounter (Signed)
Pt has had ongoing sinus issues. Pt has a cough that keeps her up at night. Would like to know if you would rx cough med? The z pak you sent has not helped pt clear up this sinus issue. Pt states to tell you she has decided to quit smoking. pls advise [facility]

## 2013-04-28 ENCOUNTER — Ambulatory Visit (INDEPENDENT_AMBULATORY_CARE_PROVIDER_SITE_OTHER): Payer: Medicare Other | Admitting: Family Medicine

## 2013-04-28 ENCOUNTER — Encounter: Payer: Self-pay | Admitting: Family Medicine

## 2013-04-28 VITALS — BP 150/80 | Temp 98.4°F | Wt 177.0 lb

## 2013-04-28 DIAGNOSIS — K219 Gastro-esophageal reflux disease without esophagitis: Secondary | ICD-10-CM

## 2013-04-28 DIAGNOSIS — J309 Allergic rhinitis, unspecified: Secondary | ICD-10-CM

## 2013-04-28 DIAGNOSIS — R1310 Dysphagia, unspecified: Secondary | ICD-10-CM

## 2013-04-28 DIAGNOSIS — F17200 Nicotine dependence, unspecified, uncomplicated: Secondary | ICD-10-CM

## 2013-04-28 DIAGNOSIS — J329 Chronic sinusitis, unspecified: Secondary | ICD-10-CM

## 2013-04-28 DIAGNOSIS — K222 Esophageal obstruction: Secondary | ICD-10-CM

## 2013-04-28 MED ORDER — FLUTICASONE PROPIONATE 50 MCG/ACT NA SUSP: 2.0000 | Freq: Every day | NASAL | Status: DC

## 2013-04-28 MED ORDER — AMOXICILLIN-POT CLAVULANATE 875-125 MG PO TABS: 1.0000 | ORAL_TABLET | Freq: Two times a day (BID) | ORAL | Status: DC

## 2013-04-28 MED ORDER — BENZONATATE 100 MG PO CAPS: 100.0000 mg | ORAL_CAPSULE | Freq: Two times a day (BID) | ORAL | Status: DC | PRN

## 2013-04-28 MED ORDER — OMEPRAZOLE 20 MG PO CPDR: 20.0000 mg | DELAYED_RELEASE_CAPSULE | Freq: Every day | ORAL | Status: DC

## 2013-04-28 NOTE — Progress Notes (Signed)
Pre visit review using our clinic review tool, if applicable. No additional management support is needed unless otherwise documented below in the visit note. 

## 2013-04-28 NOTE — Progress Notes (Signed)
Chief Complaint  Patient presents with  . Cough    chest discomfort, chills, body aches     HPI:   -started: 3-4 months ago htat she thinks is related to acid reflux, but has developed sinus issues recently that has cause sinus pain max on R, drainage in throat, chest hurts from coughing in ribs, chills at times, thick nasal congestion and zpack helped a little but did not fix it -also has allergies with sneezing -she has hx of reflux and dilation of esophagus and report issues worsening over the last few months with dysphagia to solids - feels like food getting stuck in throat -denies:fever, SOB, NVD, tooth pain, hemoptysis -has tried: duaghter's cough medication which helps -sick contacts/travel/risks: denies flu exposure or Ebola risks -Hx of: allergies  ROS: See pertinent positives and negatives per HPI.  Past Medical History  Diagnosis Date  . IBS (irritable bowel syndrome)   . Esophageal stricture   . Chronic back pain   . Depression   . Interstitial cystitis     Past Surgical History  Procedure Laterality Date  . Abdominal hysterectomy    . Appendectomy    . Tonsillectomy    . Finger surgery    . Cervical laminectomy    . Cervical fusion    . Lumbar fusion    . Esophagogastroduodenoscopy      Family History  Problem Relation Age of Onset  . Dementia Mother   . Heart attack Father   . Coronary artery disease Father   . Cancer Brother     esophageal  . Coronary artery disease Paternal Aunt   . Coronary artery disease Paternal Grandmother   . Aneurysm Brother     aortic    History   Social History  . Marital Status: Single    Spouse Name: N/A    Number of Children: N/A  . Years of Education: N/A   Social History Main Topics  . Smoking status: Current Every Day Smoker -- 1.00 packs/day    Types: Cigarettes  . Smokeless tobacco: Never Used  . Alcohol Use: No  . Drug Use: No  . Sexual Activity: None   Other Topics Concern  . None   Social  History Narrative  . None    Current outpatient prescriptions:ALPRAZolam (XANAX) 0.5 MG tablet, TAKE 1 TABLET BY MOUTH EVERY DAY AS NEEDED, Disp: 90 tablet, Rfl: 1;  aspirin 81 MG tablet, Take 81 mg by mouth daily.  , Disp: , Rfl: ;  azithromycin (ZITHROMAX Z-PAK) 250 MG tablet, Take 1 tablet (250 mg total) by mouth daily., Disp: 6 each, Rfl: 0 cyclobenzaprine (FLEXERIL) 10 MG tablet, Take 1 tablet (10 mg total) by mouth 3 (three) times daily as needed for muscle spasms., Disp: 30 tablet, Rfl: 1;  diazepam (VALIUM) 5 MG tablet, TAKE 1 TABLET 3 TIMES A DAY (MUST LAST 30 DAYS), Disp: 90 tablet, Rfl: 0;  HYDROcodone-acetaminophen (NORCO) 7.5-325 MG per tablet, TAKE 1 TABLET BY MOUTH EVERY 8 HOURS AS NEEDED FOR PAIN, Disp: 30 tablet, Rfl: 0 lisinopril (PRINIVIL,ZESTRIL) 20 MG tablet, Take 1 tablet (20 mg total) by mouth daily., Disp: 30 tablet, Rfl: 1;  meloxicam (MOBIC) 15 MG tablet, Take 1 tablet (15 mg total) by mouth daily., Disp: 30 tablet, Rfl: 3;  tiZANidine (ZANAFLEX) 4 MG tablet, TAKE 1 TABLET BY MOUTH TWICE A DAY, Disp: 60 tablet, Rfl: 3;  venlafaxine XR (EFFEXOR-XR) 150 MG 24 hr capsule, TAKE 1 CAPSULE DAILY, Disp: 30 capsule, Rfl: 6 amoxicillin-clavulanate (AUGMENTIN)  875-125 MG per tablet, Take 1 tablet by mouth 2 (two) times daily., Disp: 20 tablet, Rfl: 0;  benzonatate (TESSALON) 100 MG capsule, Take 1 capsule (100 mg total) by mouth 2 (two) times daily as needed for cough., Disp: 20 capsule, Rfl: 0;  fluticasone (FLONASE) 50 MCG/ACT nasal spray, Place 2 sprays into both nostrils daily., Disp: 16 g, Rfl: 1 omeprazole (PRILOSEC) 20 MG capsule, Take 1 capsule (20 mg total) by mouth daily., Disp: 30 capsule, Rfl: 0;  rosuvastatin (CRESTOR) 20 MG tablet, Take 1 tablet (20 mg total) by mouth 2 (two) times a week., Disp: 90 tablet, Rfl: 3  EXAM:  Filed Vitals:   04/28/13 0853  BP: 150/80  Temp: 98.4 F (36.9 C)    Body mass index is 25.57 kg/(m^2).  GENERAL: vitals reviewed and listed  above, alert, oriented, appears well hydrated and in no acute distress  HEENT: atraumatic, conjunttiva clear, no obvious abnormalities on inspection of external nose and ears, normal appearance of ear canals and TMs, clear nasal congestion, mild post oropharyngeal erythema with PND, no tonsillar edema or exudate, no sinus TTP  NECK: no obvious masses on inspection  LUNGS: clear to auscultation bilaterally, no wheezes, rales or rhonchi, good air movement  CV: HRRR, no peripheral edema  ABD: BS+, soft, NTTP  MS: moves all extremities without noticeable abnormality  PSYCH: pleasant and cooperative, no obvious depression or anxiety  ASSESSMENT AND PLAN:  Discussed the following assessment and plan:  Sinusitis - Plan: amoxicillin-clavulanate (AUGMENTIN) 875-125 MG per tablet, benzonatate (TESSALON) 100 MG capsule  GERD (gastroesophageal reflux disease) - Plan: Ambulatory referral to Gastroenterology, omeprazole (PRILOSEC) 20 MG capsule  STRICTURE, ESOPHAGEAL - Plan: Ambulatory referral to Gastroenterology  Dysphagia  TOBACCO USE  Allergic rhinitis - Plan: fluticasone (FLONASE) 50 MCG/ACT nasal spray  -we discussed possible serious and likely etiologies, workup and treatment, treatment risks and return precautions -after this discussion, [name] opted for abx for possible sinusitis (f/u with ENT if persists or recurs), prilosec and referral to GI for GERD and dysphagia, flonase for allergic rhinitis -follow up advised in one month to ensure these measures have resolved chronic cough with consideration of cessation of acei, imaging, eval by pulm etc if cough persists -of course, we advised [name]  to return or notify a doctor immediately if symptoms worsen or persist or new concerns arise.  -of course, we advised to return or notify a doctor immediately if symptoms worsen or persist or new concerns arise.    Patient Instructions  -We placed a referral for you as discussed to the  gastroenterologist for the swallowing issues. It usually takes about 1-2 weeks to process and schedule this referral. If you have not heard from [place] regarding this appointment in 2 weeks please contact our office.  -take prilosec 20mg  daily  -complete the antibiotic and try the cough medication  -start flonase 2 sprays each nostril daily then 1 spray daily each nostril  congratulations on quitting smoking!  -follow up in 1 month     Aron Inge R.

## 2013-04-28 NOTE — Patient Instructions (Signed)
-  We placed a referral for you as discussed to the gastroenterologist for the swallowing issues. It usually takes about 1-2 weeks to process and schedule this referral. If you have not heard from [place] regarding this appointment in 2 weeks please contact our office.  -take prilosec 20mg  daily  -complete the antibiotic and try the cough medication  -start flonase 2 sprays each nostril daily then 1 spray daily each nostril  congratulations on quitting smoking!  -follow up in 1 month

## 2013-04-29 ENCOUNTER — Telehealth: Payer: Self-pay | Admitting: Internal Medicine

## 2013-04-29 NOTE — Telephone Encounter (Signed)
Relevant patient education mailed to patient.  

## 2013-05-04 ENCOUNTER — Telehealth: Payer: Self-pay | Admitting: Internal Medicine

## 2013-05-04 NOTE — Telephone Encounter (Signed)
Dr Arnoldo Morale out all week, will you sign for this?

## 2013-05-04 NOTE — Telephone Encounter (Signed)
Pt needs new hydrocodone. Pt is out °

## 2013-05-05 MED ORDER — HYDROCODONE-ACETAMINOPHEN 7.5-325 MG PO TABS: ORAL_TABLET | ORAL | Status: DC

## 2013-05-05 NOTE — Telephone Encounter (Signed)
rx up front for p/u, pt aware

## 2013-05-05 NOTE — Telephone Encounter (Signed)
Ok per dr Leanne Chang

## 2013-05-08 ENCOUNTER — Other Ambulatory Visit: Payer: Self-pay | Admitting: Family

## 2013-05-11 ENCOUNTER — Other Ambulatory Visit: Payer: Self-pay | Admitting: Internal Medicine

## 2013-05-19 ENCOUNTER — Telehealth: Payer: Self-pay | Admitting: Internal Medicine

## 2013-05-19 ENCOUNTER — Encounter: Payer: Self-pay | Admitting: Gastroenterology

## 2013-05-19 NOTE — Telephone Encounter (Signed)
Pt was seen by dr Maudie Mercury on 04-28-13 and still has cough. Pt would like a rx for hydrocodone cough med. Pt is aware md out of office.

## 2013-05-20 NOTE — Telephone Encounter (Signed)
Please advise if ok ?

## 2013-05-20 NOTE — Telephone Encounter (Signed)
She was to see ENT if persistent sinus issues and possibly pulm chornic cough persisted. Needs appt. or can place referral (s) if she wishes.

## 2013-05-20 NOTE — Telephone Encounter (Signed)
Pt has an appt with Dr Deatra Ina the end of May.  She states that she has finished antibiotic and she still has a dry cough, dizziness, n/v.  Pt stated that the cough started when she started the lisinopril. Told pt that lisinopril can cause a cough. Informed pt that I will talk to Dr Arnoldo Morale in the morning and see if we need to change medicine

## 2013-05-21 MED ORDER — LOSARTAN POTASSIUM 100 MG PO TABS: 100.0000 mg | ORAL_TABLET | Freq: Every day | ORAL | Status: DC

## 2013-05-21 NOTE — Telephone Encounter (Signed)
Pt aware to d/c lisinopril and take the cozaar.  She stated that has already stopped the lisinopril x2 days and the cough has gotten better.  Lisinopril added to allergy list.  Cozaar sent in to [facility] per pt request

## 2013-05-21 NOTE — Telephone Encounter (Signed)
Change the lisinopril to cozaar 100mg 

## 2013-06-04 ENCOUNTER — Other Ambulatory Visit: Payer: Self-pay | Admitting: Internal Medicine

## 2013-06-04 ENCOUNTER — Telehealth: Payer: Self-pay | Admitting: Internal Medicine

## 2013-06-04 MED ORDER — HYDROCODONE-ACETAMINOPHEN 7.5-325 MG PO TABS: ORAL_TABLET | ORAL | Status: DC

## 2013-06-04 NOTE — Telephone Encounter (Signed)
Pt requesting re-fill on HYDROcodone-acetaminophen (NORCO) 7.5-325 MG per tablet ° °

## 2013-06-04 NOTE — Telephone Encounter (Signed)
Called and spoke with pt and pt is aware.  

## 2013-06-09 ENCOUNTER — Other Ambulatory Visit: Payer: Self-pay | Admitting: Internal Medicine

## 2013-06-19 ENCOUNTER — Other Ambulatory Visit: Payer: Self-pay | Admitting: Family Medicine

## 2013-06-29 ENCOUNTER — Other Ambulatory Visit: Payer: Self-pay | Admitting: Internal Medicine

## 2013-07-02 ENCOUNTER — Telehealth: Payer: Self-pay | Admitting: Internal Medicine

## 2013-07-02 MED ORDER — HYDROCODONE-ACETAMINOPHEN 7.5-325 MG PO TABS: ORAL_TABLET | ORAL | Status: DC

## 2013-07-02 NOTE — Telephone Encounter (Signed)
Pt request refill HYDROcodone-acetaminophen (NORCO) 7.5-325 MG per tablet °

## 2013-07-02 NOTE — Telephone Encounter (Signed)
Ok per Dr Arnoldo Morale, rx will be ready after 1, pt aware

## 2013-07-08 ENCOUNTER — Ambulatory Visit (INDEPENDENT_AMBULATORY_CARE_PROVIDER_SITE_OTHER): Payer: Medicare Other | Admitting: Physician Assistant

## 2013-07-08 ENCOUNTER — Encounter: Payer: Self-pay | Admitting: Physician Assistant

## 2013-07-08 VITALS — BP 130/70 | HR 80 | Temp 98.1°F | Resp 18 | Wt 175.0 lb

## 2013-07-08 DIAGNOSIS — R300 Dysuria: Secondary | ICD-10-CM

## 2013-07-08 DIAGNOSIS — N3010 Interstitial cystitis (chronic) without hematuria: Secondary | ICD-10-CM

## 2013-07-08 DIAGNOSIS — R079 Chest pain, unspecified: Secondary | ICD-10-CM

## 2013-07-08 DIAGNOSIS — I2510 Atherosclerotic heart disease of native coronary artery without angina pectoris: Secondary | ICD-10-CM

## 2013-07-08 DIAGNOSIS — H6990 Unspecified Eustachian tube disorder, unspecified ear: Secondary | ICD-10-CM

## 2013-07-08 DIAGNOSIS — H6980 Other specified disorders of Eustachian tube, unspecified ear: Secondary | ICD-10-CM

## 2013-07-08 LAB — POCT URINALYSIS DIPSTICK
Bilirubin, UA: NEGATIVE
Glucose, UA: NEGATIVE
Ketones, UA: NEGATIVE
Leukocytes, UA: NEGATIVE
NITRITE UA: NEGATIVE
PH UA: 5.5
Spec Grav, UA: >=1.03
UROBILINOGEN UA: 0.2

## 2013-07-08 MED ORDER — PHENAZOPYRIDINE HCL 200 MG PO TABS: 200.0000 mg | ORAL_TABLET | Freq: Three times a day (TID) | ORAL | Status: DC | PRN

## 2013-07-08 NOTE — Progress Notes (Signed)
Pre visit review using our clinic review tool, if applicable. No additional management support is needed unless otherwise documented below in the visit note. 

## 2013-07-08 NOTE — Progress Notes (Signed)
Subjective:    Patient ID: Laurie [name], female    DOB: [birth year], 58 y.o.   MRN: [id number]  HPI Patient is a 58 year old Caucasian female with a history of interstitial cystitis presenting to the clinic for difficulty urinating. The patient states that for the past 5 days she has had difficulty urinating. 5 days ago she was having severe left-sided flank pain described as 8/10 and stabbing, which resolved over the course of 2 days. For the past 3 days her pain has been in her pelvic and groin region, 6/10 cramping pain. She states that in order to fully void her bladder she feels like she has to press on her lower abdomen over her bladder. She admits to increased frequency, dysuria, urge to urinate, increased sweating from the pain, and nausea. She states that she has a family history of kidney stones and did not know if this could be a kidney stone. She also has a history of interstitial cystitis, but has not seen a urologist in about 10 years. She has a family history of bladder cancer in her father and her aunt. She has not tried anything to relieve the symptoms. She denies fevers, chills, vomiting, diarrhea, shortness of breath.  She also has complained of chest pain. She has a history of coronary artery disease, seen cardiology in the past, not been seen by cardiology in about 2 years. She states that last week she had an episode of chest pain at night that lasted several minutes, which included radiation into both of her arms. She thought about going to the emergency department, however she remembered being told to take nitroglycerin for the her chest pain, took nitroglycerin and the pain resolved after one dose. She has not had any chest pain since this episode. She denies palpitation, syncope, or shortness of breath with the episode. She states that her last episode of chest pain was approximately 2 years ago. She is currently asymptomatic.  She is also slightly bothered by a feeling of  water in her left ear. She states that she has felt a little dizzy, and has had ear fullness. She states that she has a history of bad allergies. She currently is not taking any allergy medicine at home. She has not tried anything for these symptoms. She denies syncope, ear pain, vertigo, balance issues, or hearing changes.   Review of Systems As per the history of present illness and otherwise negative    Past Medical History  Diagnosis Date  . IBS (irritable bowel syndrome)   . Esophageal stricture   . Chronic back pain   . Depression   . Interstitial cystitis    Past Surgical History  Procedure Laterality Date  . Abdominal hysterectomy    . Appendectomy    . Tonsillectomy    . Finger surgery    . Cervical laminectomy    . Cervical fusion    . Lumbar fusion    . Esophagogastroduodenoscopy      reports that she has been smoking Cigarettes.  She has been smoking about 1.00 pack per day. She has never used smokeless tobacco. She reports that she does not drink alcohol or use illicit drugs. family history includes Aneurysm in her brother; Cancer in her brother; Coronary artery disease in her father, paternal aunt, and paternal grandmother; Dementia in her mother; Heart attack in her father. Allergies  Allergen Reactions  . Codeine     REACTION: Vomiting  . Lisinopril Cough  . Sulfonamide Derivatives  REACTION: Upset GI   The PFS history was reviewed with the pt at time of visit.    Objective:   Physical Exam  Nursing note and vitals reviewed. Constitutional: She is oriented to person, place, and time. She appears well-developed and well-nourished. No distress.  HENT:  Head: Normocephalic and atraumatic.  Right Ear: External ear normal.  Left Ear: External ear normal.  Nose: Nose normal.  Mouth/Throat: Oropharynx is clear and moist. No oropharyngeal exudate.  Bilateral tympanic membranes are slightly bulging and otherwise normal in appearance.  Eyes: Conjunctivae and  EOM are normal. Pupils are equal, round, and reactive to light.  Neck: Normal range of motion. Neck supple. No JVD present.  Cardiovascular: Normal rate, regular rhythm, normal heart sounds and intact distal pulses.  Exam reveals no gallop and no friction rub.   No murmur heard. Pulmonary/Chest: Effort normal and breath sounds normal. No stridor. No respiratory distress. She has no wheezes. She has no rales. She exhibits no tenderness.  Abdominal: Soft. Bowel sounds are normal. She exhibits no distension and no mass. There is tenderness (diffuse mild tenderness to palpation over the bladder in the lower abdomen. ). There is no rebound and no guarding.  Musculoskeletal: Normal range of motion.  Lymphadenopathy:    She has no cervical adenopathy.  Neurological: She is alert and oriented to person, place, and time.  Skin: Skin is warm and dry. No rash noted. She is not diaphoretic. No erythema. No pallor.  Psychiatric: She has a normal mood and affect. Her behavior is normal. Judgment and thought content normal.   Filed Vitals:   07/08/13 0805  BP: 130/70  Pulse: 80  Temp: 98.1 F (36.7 C)  Resp: 18   Lab Results  Component Value Date   WBC 11.1* 12/03/2012   HGB 13.4 12/03/2012   HCT 38.6 12/03/2012   PLT 276.0 12/03/2012   GLUCOSE 93 12/03/2012   CHOL 236* 12/03/2012   TRIG 208.0* 12/03/2012   HDL 28.50* 12/03/2012   LDLDIRECT 177.9 12/03/2012   ALT 14 12/03/2012   AST 12 12/03/2012   NA 140 12/03/2012   K 4.2 12/03/2012   CL 102 12/03/2012   CREATININE 0.8 12/03/2012   BUN 14 12/03/2012   CO2 29 12/03/2012   TSH 1.73 12/03/2012   INR 1.0 ratio 11/21/2009   Urinalysis    Component Value Date/Time   COLORURINE LT. YELLOW 11/04/2011 1127   APPEARANCEUR CLEAR 11/04/2011 1127   LABSPEC 1.010 11/04/2011 1127   PHURINE 6.5 11/04/2011 1127   GLUCOSEU NEGATIVE 11/04/2011 1127   HGBUR NEGATIVE 11/04/2011 1127   HGBUR negative 01/01/2010 1055   BILIRUBINUR n 07/08/2013 0806    BILIRUBINUR NEGATIVE 11/04/2011 1127   KETONESUR NEGATIVE 11/04/2011 1127   PROTEINUR trace 07/08/2013 0806   UROBILINOGEN 0.2 07/08/2013 0806   UROBILINOGEN 0.2 11/04/2011 1127   NITRITE n 07/08/2013 0806   NITRITE NEGATIVE 11/04/2011 1127   LEUKOCYTESUR Negative 07/08/2013 0806   EKG: Sinus Rhythm- Negative T-waves - May be normal - possible Anteroseptal/anterior ischemia (similar reading in previous tracings).      Assessment & Plan:  [name] was seen today for dysuria.  Diagnoses and associated orders for this visit:  Dysuria Comments: UA showed now infection. ?nephrolith, ?IC. Will get culture and treat symptomatically. - POCT urinalysis dipstick - Culture, Urine - Ambulatory referral to Urology - phenazopyridine (PYRIDIUM) 200 MG tablet; Take 1 tablet (200 mg total) by mouth 3 (three) times daily as needed for pain.  Chest pain  Comments: Resolved. Isolated episode 1 week ago. Relieved by nitro.  - EKG 12-Lead- similar to previous tracings, some changes, possibly better looking tracing than previous tracings. - Ambulatory referral to Cardiology  INTERSTITIAL CYSTITIS Comments: Not seen by urology for years. Father and aunt with bladder cancer. Increasing urinary symptoms. Will refer again. - Ambulatory referral to Urology - phenazopyridine (PYRIDIUM) 200 MG tablet; Take 1 tablet (200 mg total) by mouth 3 (three) times daily as needed for pain.  CAD, NATIVE VESSEL Comments: Now with worsening symptoms. Not seen by cardiology in years. Will refer to cardiology. - Ambulatory referral to Cardiology  ETD (eustachian tube dysfunction) Comments: Ear fullness, no sign of infection, strong allergy history. Will try nasal steroid spray.  Return precautions provided.  Plan to follow up in 1 month to reassess and to make sure appointments were made and kept.  Patient Instructions  You will be called to schedule an appointment with cardiology.  You'll be called to schedule an  appointment with urology.  Phenazopyridine 3 times daily as needed for urinary pain. Take after a meal. May use less often if symptoms well controlled.  Over the Counter Flonase OR Nasacort AQ 1 spray in each nostril twice a day as needed. Use the "crossover" technique into opposite nostril spraying toward opposite ear @ 45 degree angle, not straight up into nostril. For ear fullness.  Follow up in 1 month to reassess and make sure that appointments were made and kept.

## 2013-07-08 NOTE — Patient Instructions (Addendum)
You will be called to schedule an appointment with cardiology.  You'll be called to schedule an appointment with urology.  Phenazopyridine 3 times daily as needed for urinary pain. Take after a meal. May use less often if symptoms well controlled.  Over the Counter Flonase OR Nasacort AQ 1 spray in each nostril twice a day as needed. Use the "crossover" technique into opposite nostril spraying toward opposite ear @ 45 degree angle, not straight up into nostril. For ear fullness.  Follow up in 1 month to reassess and make sure that appointments were made and kept.     Angina Pectoris Angina pectoris is extreme discomfort in your chest, neck, or arm. Your doctor may call it just angina. It is caused by a lack of oxygen to your heart wall. It may feel like tightness or heavy pressure. It may feel like a crushing or squeezing pain. Some people say it feels like gas. It may go down your shoulders, back, and arms. Some people have symptoms other than pain. These include:  Tiredness.  Shortness of breath.  Cold sweats.  Feeling sick to your stomach (nausea). There are four types of angina:  Stable angina often lasts the same amount of time each time it happens. Activity, stress, or excitement can bring it on. It often gets better after taking a special medicine called nitroglycerin. This goes under your tongue.  Unstable angina can happen when you are not active or even during sleep. It can suddenly get worse or happen more often. It may not get better after taking the special medicine. It can last up to 30 minutes.  Microvascular angina is more common in women. It may be more severe or last longer than other types.  Prinzmetal angina often happens when you are not active or in the early morning hours. HOME CARE   Only take medicines as told by your doctor.  Stay active or exercise more as told by your doctor.  Limit very hard activity as told by your doctor.  Limit heavy lifting as  told by your doctor.  Keep a healthy weight.  Learn about and eat foods that are healthy for your heart.  Do not smoke. GET HELP RIGHT AWAY IF:   You have chest, neck, deep shoulder, or arm pain or discomfort that lasts more than a few minutes.  You have chest, neck, deep shoulder, or arm pain or discomfort that goes away and comes back over and over again.  You have heavy sweating that seems to happen for no reason.  You have shortness of breath or trouble breathing.  Your angina does not get better after a few minutes of rest.  Your angina does not get better after you take nitroglycerin medicine. These can all be symptoms of a heart attack. Get help right away. Call your local emergency service (911 in [place] Do not  drive yourself to the hospital. Do not  wait to for your symptoms to go away. MAKE SURE YOU:   Understand these instructions.  Will watch your condition.  Will get help right away if you are not doing well or get worse. Document Released: 07/17/2007 Document Revised: 01/15/2012 Document Reviewed: 11/07/2011 [facility] Patient Information 2014 [place], [place].

## 2013-07-09 ENCOUNTER — Encounter: Payer: Self-pay | Admitting: Gastroenterology

## 2013-07-09 ENCOUNTER — Ambulatory Visit (INDEPENDENT_AMBULATORY_CARE_PROVIDER_SITE_OTHER): Payer: Medicare Other | Admitting: Gastroenterology

## 2013-07-09 VITALS — BP 136/82 | HR 80 | Ht 69.0 in | Wt 172.0 lb

## 2013-07-09 DIAGNOSIS — K222 Esophageal obstruction: Secondary | ICD-10-CM

## 2013-07-09 DIAGNOSIS — Z1211 Encounter for screening for malignant neoplasm of colon: Secondary | ICD-10-CM

## 2013-07-09 MED ORDER — NA SULFATE-K SULFATE-MG SULF 17.5-3.13-1.6 GM/177ML PO SOLN: 1.0000 | Freq: Once | ORAL | Status: DC

## 2013-07-09 NOTE — Patient Instructions (Signed)
You have been scheduled for an endoscopy with propofol. Please follow written instructions given to you at your visit today. If you use inhalers (even only as needed), please bring them with you on the day of your procedure. Your physician has requested that you go to www.startemmi.com and enter the access code given to you at your visit today. This web site gives a general overview about your procedure. However, you should still follow specific instructions given to you by our office regarding your preparation for the procedure.  You have been scheduled for a colonoscopy with propofol. Please follow written instructions given to you at your visit today.  Please pick up your prep kit at the pharmacy within the next 1-3 days. If you use inhalers (even only as needed), please bring them with you on the day of your procedure. Your physician has requested that you go to www.startemmi.com and enter the access code given to you at your visit today. This web site gives a general overview about your procedure. However, you should still follow specific instructions given to you by our office regarding your preparation for the procedure.

## 2013-07-09 NOTE — Assessment & Plan Note (Signed)
Plan followup colonoscopy 

## 2013-07-09 NOTE — Assessment & Plan Note (Signed)
Patient has recurrent dysphagia to solids likely secondary to a peptic stricture.    Recommendations #1 endoscopy with dilation as indicated

## 2013-07-09 NOTE — Progress Notes (Signed)
_                                                                                                                History of Present Illness: Pleasant 58 year old white female with history of esophageal stricture referred for recurrent dysphagia.  A stricture was dilated in 2003.  She's experiencing dysphagia to solids.  Occasionally she has chest pain with swallowing as well.  She denies pyrosis.  Last colonoscopy in 2003 was normal.    Past Medical History  Diagnosis Date  . IBS (irritable bowel syndrome)   . Esophageal stricture   . Chronic back pain   . Depression   . Interstitial cystitis    Past Surgical History  Procedure Laterality Date  . Abdominal hysterectomy    . Appendectomy    . Tonsillectomy    . Finger surgery    . Cervical laminectomy    . Cervical fusion    . Lumbar fusion    . Esophagogastroduodenoscopy     family history includes Aneurysm in her brother; Cancer in her brother; Coronary artery disease in her father, paternal aunt, and paternal grandmother; Dementia in her mother; Heart attack in her father. Current Outpatient Prescriptions  Medication Sig Dispense Refill  . ALPRAZolam (XANAX) 0.5 MG tablet TAKE 1 TABLET BY MOUTH EVERY DAY AS NEEDED  90 tablet  0  . diazepam (VALIUM) 5 MG tablet TAKE 1 TABLET BY MOUTH 3 TIMES A DAY  90 tablet  3  . HYDROcodone-acetaminophen (NORCO) 7.5-325 MG per tablet TAKE 1 TABLET BY MOUTH EVERY 8 HOURS AS NEEDED FOR PAIN  30 tablet  0  . losartan (COZAAR) 100 MG tablet Take 1 tablet (100 mg total) by mouth daily.  90 tablet  3  . venlafaxine XR (EFFEXOR-XR) 150 MG 24 hr capsule TAKE ONE CAPSULE BY MOUTH EVERY DAY  30 capsule  6   No current facility-administered medications for this visit.   Allergies as of 07/09/2013 - Review Complete 07/09/2013  Allergen Reaction Noted  . Codeine    . Lisinopril Cough 05/21/2013  . Sulfonamide derivatives      reports that she has been smoking Cigarettes.  She has  been smoking about 1.00 pack per day. She has never used smokeless tobacco. She reports that she does not drink alcohol or use illicit drugs.     Review of Systems: She recently had a limited episode of left upper quadrant pain that has subsequently subsided Pertinent positive and negative review of systems were noted in the above HPI section. All other review of systems were otherwise negative.  Vital signs were reviewed in today's medical record Physical Exam: General: Well developed , well nourished, no acute distress Skin: anicteric Head: Normocephalic and atraumatic Eyes:  sclerae anicteric, EOMI Ears: Normal auditory acuity Mouth: No deformity or lesions Neck: Supple, no masses or thyromegaly Lungs: Clear throughout to auscultation Heart: Regular rate and rhythm; no  rubs or bruits.  There is a 1/6 early systolic murmur Abdomen: Soft, non  tender and non distended. No masses, hepatosplenomegaly or hernias noted. Normal Bowel sounds Rectal:deferred Musculoskeletal: Symmetrical with no gross deformities  Skin: No lesions on visible extremities Pulses:  Normal pulses noted Extremities: No clubbing, cyanosis, edema or deformities noted Neurological: Alert oriented x 4, grossly nonfocal Cervical Nodes:  No significant cervical adenopathy Inguinal Nodes: No significant inguinal adenopathy Psychological:  Alert and cooperative. Normal mood and affect  See Assessment and Plan under Problem List

## 2013-07-10 LAB — URINE CULTURE
Colony Count: NO GROWTH
Organism ID, Bacteria: NO GROWTH

## 2013-07-16 ENCOUNTER — Encounter: Payer: Self-pay | Admitting: Gastroenterology

## 2013-07-28 ENCOUNTER — Encounter: Payer: Self-pay | Admitting: Internal Medicine

## 2013-08-02 ENCOUNTER — Telehealth: Payer: Self-pay | Admitting: Internal Medicine

## 2013-08-02 NOTE — Telephone Encounter (Signed)
Pt request refill of HYDROcodone-acetaminophen (NORCO) 7.5-325 MG per tablet Pt request  #60 tabs 2/day. Pt states one a day is not doing the job, pls advise

## 2013-08-05 NOTE — Telephone Encounter (Signed)
Pt following up on med request. Pt has had severe cramping in bladder all week, pt [facility].

## 2013-08-06 ENCOUNTER — Ambulatory Visit: Payer: Medicare Other | Admitting: Physician Assistant

## 2013-08-06 MED ORDER — HYDROCODONE-ACETAMINOPHEN 7.5-325 MG PO TABS: ORAL_TABLET | ORAL | Status: DC

## 2013-08-06 NOTE — Telephone Encounter (Signed)
Per Dr. Arnoldo Morale pt can have a refill as it states but pt needs to be seen if she needs a change to her pain mediation. Dr. Arnoldo Morale has not seen pt in 6 months. Left a message for return call.

## 2013-08-06 NOTE — Telephone Encounter (Signed)
Called and spoke with pt and pt is aware of Dr. Adline Mango recommendations. Pt verbalized understanding.

## 2013-08-06 NOTE — Telephone Encounter (Signed)
Pt is in pain needs med today

## 2013-08-09 ENCOUNTER — Ambulatory Visit: Payer: Medicare Other | Admitting: Physician Assistant

## 2013-08-17 ENCOUNTER — Ambulatory Visit (AMBULATORY_SURGERY_CENTER): Payer: Medicare Other | Admitting: Gastroenterology

## 2013-08-17 ENCOUNTER — Encounter: Payer: Self-pay | Admitting: Gastroenterology

## 2013-08-17 VITALS — BP 137/62 | HR 58 | Temp 98.5°F | Resp 19 | Ht 69.0 in | Wt 172.0 lb

## 2013-08-17 DIAGNOSIS — K222 Esophageal obstruction: Secondary | ICD-10-CM

## 2013-08-17 DIAGNOSIS — R1310 Dysphagia, unspecified: Secondary | ICD-10-CM

## 2013-08-17 DIAGNOSIS — K209 Esophagitis, unspecified without bleeding: Secondary | ICD-10-CM

## 2013-08-17 MED ORDER — SODIUM CHLORIDE 0.9 % IV SOLN: 500.0000 mL | INTRAVENOUS | Status: DC

## 2013-08-17 MED ORDER — OMEPRAZOLE 20 MG PO CPDR: 20.0000 mg | DELAYED_RELEASE_CAPSULE | Freq: Every day | ORAL | Status: DC

## 2013-08-17 NOTE — Progress Notes (Signed)
Report to PACU, RN, vss, BBS= Clear.  

## 2013-08-17 NOTE — Progress Notes (Signed)
Called to room to assist during endoscopic procedure.  Patient ID and intended procedure confirmed with present staff. Received instructions for my participation in the procedure from the performing physician.  

## 2013-08-17 NOTE — Op Note (Signed)
[place]  [address]. [place], [address]   ENDOSCOPY PROCEDURE REPORT  PATIENT: Laurie, [name]  MR#: [id number] BIRTHDATE: 12-22-1955 , 31  yrs. old GENDER: Female ENDOSCOPIST: Inda Castle, MD REFERRED BY: PROCEDURE DATE:  08/17/2013 PROCEDURE:  EGD, diagnostic and Maloney dilation of esophagus ASA CLASS:     Class II INDICATIONS:  Dysphagia. MEDICATIONS: MAC sedation, administered by CRNA, Fentanyl-Detailed, Propofol (Diprivan) 140 mg IV, and Simethicone 0.6cc PO TOPICAL ANESTHETIC:  DESCRIPTION OF PROCEDURE: After the risks benefits and alternatives of the procedure were thoroughly explained, informed consent was obtained.  The LB [id number] endoscope was introduced through the mouth and advanced to the third portion of the duodenum. Without limitations.  The instrument was slowly withdrawn as the mucosa was fully examined.      At the GE junction there was an early stricture.  Mucosa was edematous consistent with esophagitis.  The 9 mm gastroscope easily traversed the stricture.   At the GE junction there was an early stricture.  Mucosa was edematous consistent with esophagitis.  The 9 mm gastroscope easily traversed the stricture.   The remainder of the upper endoscopy exam was otherwise normal.  Retroflexed views revealed no abnormalities.     The scope was then withdrawn from the patient and the procedure completed.  A #52 Isabell Jarvis dilator was passed with mild resistance.  There was no heme  COMPLICATIONS: There were no complications. ENDOSCOPIC IMPRESSION: 1.   esophageal stricture-status post Maloney dilation 2.  esophagitis  RECOMMENDATIONS: begin omeprazole 20 mg daily REPEAT EXAM:  eSigned:  Inda Castle, MD 08/17/2013 3:25 PM   GS:UPJS Vear Clock, MD

## 2013-08-17 NOTE — Patient Instructions (Signed)
YOU HAD AN ENDOSCOPIC PROCEDURE TODAY AT [facility]: Refer to the procedure report that was given to you for any specific questions about what was found during the examination.  If the procedure report does not answer your questions, please call your gastroenterologist to clarify.  If you requested that your care partner not be given the details of your procedure findings, then the procedure report has been included in a sealed envelope for you to review at your convenience later.  YOU SHOULD EXPECT: Some feelings of bloating in the abdomen. Passage of more gas than usual.  Walking can help get rid of the air that was put into your GI tract during the procedure and reduce the bloating. If you had a lower endoscopy (such as a colonoscopy or flexible sigmoidoscopy) you may notice spotting of blood in your stool or on the toilet paper. If you underwent a bowel prep for your procedure, then you may not have a normal bowel movement for a few days.  DIET: FOLLOW DILATION HANDOUT.   ACTIVITY: Your care partner should take you home directly after the procedure.  You should plan to take it easy, moving slowly for the rest of the day.  You can resume normal activity the day after the procedure however you should NOT DRIVE or use heavy machinery for 24 hours (because of the sedation medicines used during the test).    SYMPTOMS TO REPORT IMMEDIATELY: A gastroenterologist can be reached at any hour.  During normal business hours, 8:30 AM to 5:00 PM Monday through Friday, call [phone].  After hours and on weekends, please call the GI answering service at [phone] who will take a message and have the physician on call contact you.   Following upper endoscopy (EGD)  Vomiting of blood or coffee ground material  New chest pain or pain under the shoulder blades  Painful or persistently difficult swallowing  New shortness of breath  Fever of 100F or higher  Black, tarry-looking  stools  FOLLOW UP: If any biopsies were taken you will be contacted by phone or by letter within the next 1-3 weeks.  Call your gastroenterologist if you have not heard about the biopsies in 3 weeks.  Our staff will call the home number listed on your records the next business day following your procedure to check on you and address any questions or concerns that you may have at that time regarding the information given to you following your procedure. This is a courtesy call and so if there is no answer at the home number and we have not heard from you through the emergency physician on call, we will assume that you have returned to your regular daily activities without incident.  SIGNATURES/CONFIDENTIALITY: You and/or your care partner have signed paperwork which will be entered into your electronic medical record.  These signatures attest to the fact that that the information above on your After Visit Summary has been reviewed and is understood.  Full responsibility of the confidentiality of this discharge information lies with you and/or your care-partner.    Resume medications. Information given on dilation diet with discharge instructions.

## 2013-08-18 ENCOUNTER — Telehealth: Payer: Self-pay

## 2013-08-18 NOTE — Telephone Encounter (Signed)
  Follow up Call-  Call back number 08/17/2013  Post procedure Call Back phone  # [phone]  Permission to leave phone message Yes     Patient questions:  Do you have a fever, pain , or abdominal swelling? No. Pain Score  0 *  Have you tolerated food without any problems? Yes.    Have you been able to return to your normal activities? Yes.    Do you have any questions about your discharge instructions: Diet   No. Medications  No. Follow up visit  No.  Do you have questions or concerns about your Care? Yes.    Actions: * If pain score is 4 or above: No action needed, pain <4.  Per the pt she said her throat was "scratchy".  She said she did have a cough last night.  No fever noted.  Pt c/o being "gassy".  I advised the pt to try OTC succrets or chloroseptic loz for her thraot.  Also may try warm water gargle.  Cough may use OTC cough drop.  And I encouraged the pt to pass gas from her rectum or belcher the air out if she can.  The extra gas is normal after and EGD.  I asked her to call us back if her sx do not improve today.  Pt said she would.  She thanked me for the suggestions. maw

## 2013-08-31 ENCOUNTER — Telehealth: Payer: Self-pay | Admitting: Internal Medicine

## 2013-08-31 DIAGNOSIS — M5020 Other cervical disc displacement, unspecified cervical region: Secondary | ICD-10-CM

## 2013-08-31 DIAGNOSIS — M5412 Radiculopathy, cervical region: Secondary | ICD-10-CM

## 2013-08-31 NOTE — Telephone Encounter (Signed)
Pt request refill HYDROcodone-acetaminophen (NORCO) 7.5-325 MG per tablet °

## 2013-08-31 NOTE — Telephone Encounter (Signed)
Pls advise on refill. 

## 2013-08-31 NOTE — Telephone Encounter (Signed)
Called and spoke with pt and pt states she is on disability due to chronic pain.  Pt states she has DDD and needs this medication. Pt verbalized understanding.

## 2013-08-31 NOTE — Telephone Encounter (Signed)
Pt had requested a refill on this medication last month as well. Pt was given 1 month supply under the condition that she make an appointment with Dr. Arnoldo Morale to be re-evaluated as she was stating she was needing more frequent medication dosing to control pain. She has not had ROV since last refill, and so we will not refill her medication. She needs to have a ROV, probably with Dr. Arnoldo Morale, or alternatively with her new PCP. Unfortunately, I did not see pt for chronic pain in the past, so I am uncomfortable prescribing without dedicated office visit regarding the pain.

## 2013-08-31 NOTE — Telephone Encounter (Signed)
Per Silvestre Moment new pt appts for Yong Channel are out into October.  Will discuss with Dr. Arnoldo Morale to see if rx can be filled 1 more time until visit.

## 2013-09-03 MED ORDER — HYDROCODONE-ACETAMINOPHEN 7.5-325 MG PO TABS: ORAL_TABLET | ORAL | Status: DC

## 2013-09-03 NOTE — Telephone Encounter (Signed)
Called and spoke with pt and pt is aware.  Pt would like to know if Dr. Yong Channel will take over oxycodone rx.  Her upcoming appt is on 10.5.15.

## 2013-09-06 NOTE — Telephone Encounter (Signed)
I do not prescribe chronic narcotics for degenerative disc disease. I would be happy to refer patient to pain management if she would like for me to provider her care in other areas.

## 2013-09-07 ENCOUNTER — Telehealth: Payer: Self-pay | Admitting: Gastroenterology

## 2013-09-07 NOTE — Telephone Encounter (Signed)
Pt has concerns about the change in her medication.  She is sleeping more than usual.  losartan (COZAAR) 100 MG tablet [[id number].  Best number to call -pt is [phone].

## 2013-09-07 NOTE — Telephone Encounter (Signed)
Called pt to tell her to come to the office and pick up a sample kit

## 2013-09-07 NOTE — Telephone Encounter (Signed)
Called and spoke with pt and pt would like referral to pain management.  Referral placed. Per Dr. Yong Channel he does not presribe alprazolam and pt would be referred to psychiatrist.. Pt states she may change physicians.  Pt will call office back to let us know.

## 2013-09-07 NOTE — Telephone Encounter (Signed)
Spoke with pt about blood pressure medication.  Advised that when she called the office in April she was switched from lisinopril 20 mg to losartan 100 mg per Dr. Adline Mango request. Pt verbalized understanding.

## 2013-09-10 ENCOUNTER — Ambulatory Visit (AMBULATORY_SURGERY_CENTER): Payer: Medicare Other | Admitting: Gastroenterology

## 2013-09-10 ENCOUNTER — Encounter: Payer: Self-pay | Admitting: Gastroenterology

## 2013-09-10 VITALS — BP 132/80 | HR 65 | Temp 98.4°F | Resp 18 | Ht 69.0 in | Wt 172.0 lb

## 2013-09-10 DIAGNOSIS — K648 Other hemorrhoids: Secondary | ICD-10-CM

## 2013-09-10 DIAGNOSIS — D126 Benign neoplasm of colon, unspecified: Secondary | ICD-10-CM

## 2013-09-10 DIAGNOSIS — Z1211 Encounter for screening for malignant neoplasm of colon: Secondary | ICD-10-CM

## 2013-09-10 MED ORDER — SODIUM CHLORIDE 0.9 % IV SOLN: 500.0000 mL | INTRAVENOUS | Status: DC

## 2013-09-10 NOTE — Op Note (Signed)
[employer]  [address]. [place], [address]   COLONOSCOPY PROCEDURE REPORT  PATIENT: Laurie [name], Laurie [name]  MR#: [id number] BIRTHDATE: 1955/06/07 , 70  yrs. old GENDER: Female ENDOSCOPIST: Inda Castle, MD REFERRED BY: PROCEDURE DATE:  09/10/2013 PROCEDURE:   Colonoscopy with snare polypectomy First Screening Colonoscopy - Avg.  risk and is 50 yrs.  old or older - No.  Prior Negative Screening - Now for repeat screening. N/A  History of Adenoma - Now for follow-up colonoscopy & has been > or = to 3 yrs.  N/A  Polyps Removed Today? Yes. ASA CLASS:   Class II INDICATIONS:Average risk patient for colon cancer. MEDICATIONS: MAC sedation, administered by CRNA and Propofol (Diprivan) 360 mg IV  DESCRIPTION OF PROCEDURE:   After the risks benefits and alternatives of the procedure were thoroughly explained, informed consent was obtained.  A digital rectal exam revealed no abnormalities of the rectum.   The LB [id number]  endoscope was introduced through the anus and advanced to the cecum, which was identified by both the appendix and ileocecal valve. No adverse events experienced.   The quality of the prep was excellent using Suprep  The instrument was then slowly withdrawn as the colon was fully examined.      COLON FINDINGS: A sessile polyp measuring 5 mm in size was found in the ascending colon.  A polypectomy was performed with a cold snare.  The resection was complete and the polyp tissue was completely retrieved.   A sessile polyp measuring 3 mm in size was found in the descending colon.  A polypectomy was performed with a cold snare.  The resection was complete and the polyp tissue was completely retrieved.   A lipoma was found in the ascending colon. Internal hemorrhoids were found.  Retroflexed views revealed no abnormalities. The time to cecum=4 minutes 34 seconds.  Withdrawal time=12 minutes 16 seconds.  The scope was withdrawn and  the procedure completed. COMPLICATIONS: There were no complications.  ENDOSCOPIC IMPRESSION: 1.   Sessile polyp measuring 5 mm in size was found in the ascending colon; polypectomy was performed with a cold snare 2.   Sessile polyp measuring 3 mm in size was found in the descending colon; polypectomy was performed with a cold snare 3.   Lipoma in the ascending colon 4.   Internal hemorrhoids  RECOMMENDATIONS: If the polyp(s) removed today are proven to be adenomatous (pre-cancerous) polyps, you will need a repeat colonoscopy in 5 years.  Otherwise you should continue to follow colorectal cancer screening guidelines for "routine risk" patients with colonoscopy in 10 years.  You will receive a letter within 1-2 weeks with the results of your biopsy as well as final recommendations.  Please call my office if you have not received a letter after 3 weeks.   eSigned:  Inda Castle, MD 09/10/2013 1:59 PM   cc: Garret Reddish, MD   PATIENT NAME:  Magalie, Almon MR#: [id number]

## 2013-09-10 NOTE — Progress Notes (Signed)
Procedure ends, to recovery, report given and VSS. 

## 2013-09-10 NOTE — Patient Instructions (Signed)
YOU HAD AN ENDOSCOPIC PROCEDURE TODAY AT THE [facility]: Refer to the procedure report that was given to you for any specific questions about what was found during the examination.  If the procedure report does not answer your questions, please call your gastroenterologist to clarify.  If you requested that your care partner not be given the details of your procedure findings, then the procedure report has been included in a sealed envelope for you to review at your convenience later.  YOU SHOULD EXPECT: Some feelings of bloating in the abdomen. Passage of more gas than usual.  Walking can help get rid of the air that was put into your GI tract during the procedure and reduce the bloating. If you had a lower endoscopy (such as a colonoscopy or flexible sigmoidoscopy) you may notice spotting of blood in your stool or on the toilet paper. If you underwent a bowel prep for your procedure, then you may not have a normal bowel movement for a few days.  DIET: Your first meal following the procedure should be a light meal and then it is ok to progress to your normal diet.  A half-sandwich or bowl of soup is an example of a good first meal.  Heavy or fried foods are harder to digest and may make you feel nauseous or bloated.  Likewise meals heavy in dairy and vegetables can cause extra gas to form and this can also increase the bloating.  Drink plenty of fluids but you should avoid alcoholic beverages for 24 hours.  ACTIVITY: Your care partner should take you home directly after the procedure.  You should plan to take it easy, moving slowly for the rest of the day.  You can resume normal activity the day after the procedure however you should NOT DRIVE or use heavy machinery for 24 hours (because of the sedation medicines used during the test).    SYMPTOMS TO REPORT IMMEDIATELY: A gastroenterologist can be reached at any hour.  During normal business hours, 8:30 AM to 5:00 PM Monday through Friday,  call [phone].  After hours and on weekends, please call the GI answering service at [phone] who will take a message and have the physician on call contact you.   Following lower endoscopy (colonoscopy or flexible sigmoidoscopy):  Excessive amounts of blood in the stool  Significant tenderness or worsening of abdominal pains  Swelling of the abdomen that is new, acute  Fever of 100F or higher    FOLLOW UP: If any biopsies were taken you will be contacted by phone or by letter within the next 1-3 weeks.  Call your gastroenterologist if you have not heard about the biopsies in 3 weeks.  Our staff will call the home number listed on your records the next business day following your procedure to check on you and address any questions or concerns that you may have at that time regarding the information given to you following your procedure. This is a courtesy call and so if there is no answer at the home number and we have not heard from you through the emergency physician on call, we will assume that you have returned to your regular daily activities without incident.  SIGNATURES/CONFIDENTIALITY: You and/or your care partner have signed paperwork which will be entered into your electronic medical record.  These signatures attest to the fact that that the information above on your After Visit Summary has been reviewed and is understood.  Full responsibility of the confidentiality   of this discharge information lies with you and/or your care-partner.  Polyp and hemorrhoid informtion given.

## 2013-09-10 NOTE — Progress Notes (Signed)
Called to room to assist during endoscopic procedure.  Patient ID and intended procedure confirmed with present staff. Received instructions for my participation in the procedure from the performing physician.  

## 2013-09-13 ENCOUNTER — Telehealth: Payer: Self-pay

## 2013-09-13 NOTE — Telephone Encounter (Signed)
  Follow up Call-  Call back number 09/10/2013 08/17/2013  Post procedure Call Back phone  # [phone]  Permission to leave phone message Yes Yes     Patient questions:  Do you have a fever, pain , or abdominal swelling? No. Pain Score  0 *  Have you tolerated food without any problems? Yes.    Have you been able to return to your normal activities? Yes.    Do you have any questions about your discharge instructions: Diet   No. Medications  No. Follow up visit  No.  Do you have questions or concerns about your Care? No.  Actions: * If pain score is 4 or above: No action needed, pain <4.

## 2013-09-16 ENCOUNTER — Encounter: Payer: Self-pay | Admitting: Gastroenterology

## 2013-09-21 ENCOUNTER — Other Ambulatory Visit: Payer: Self-pay | Admitting: Internal Medicine

## 2013-09-22 ENCOUNTER — Encounter: Payer: Self-pay | Admitting: Gastroenterology

## 2013-09-24 ENCOUNTER — Ambulatory Visit: Payer: Medicare Other | Admitting: Physician Assistant

## 2013-10-19 ENCOUNTER — Telehealth: Payer: Self-pay | Admitting: Internal Medicine

## 2013-10-19 NOTE — Telephone Encounter (Signed)
No pain medication refills without evaluation by me in office. She can be worked in this week to discuss pain management only.

## 2013-10-19 NOTE — Telephone Encounter (Signed)
Dr. Hunter please see below 

## 2013-10-19 NOTE — Telephone Encounter (Signed)
Lm on pt home and cell VM TCB

## 2013-10-19 NOTE — Telephone Encounter (Signed)
Pt states she will have to CB when she figures out transportation to get here sooner than her 11/15/13 appt

## 2013-10-19 NOTE — Telephone Encounter (Signed)
Pt request refill HYDROcodone-acetaminophen (NORCO) 7.5-325 MG per tablet Pt states she is on permanent disiabilty and pain is getting worse daily. Pt has appt 10/05

## 2013-11-15 ENCOUNTER — Ambulatory Visit: Payer: Medicare Other | Admitting: Family Medicine

## 2013-11-29 ENCOUNTER — Encounter: Payer: Self-pay | Admitting: Family

## 2013-11-29 ENCOUNTER — Ambulatory Visit (INDEPENDENT_AMBULATORY_CARE_PROVIDER_SITE_OTHER): Payer: Medicare Other | Admitting: Family

## 2013-11-29 VITALS — BP 118/78 | Temp 98.3°F | Wt 172.0 lb

## 2013-11-29 DIAGNOSIS — R300 Dysuria: Secondary | ICD-10-CM

## 2013-11-29 DIAGNOSIS — Z23 Encounter for immunization: Secondary | ICD-10-CM

## 2013-11-29 DIAGNOSIS — N390 Urinary tract infection, site not specified: Secondary | ICD-10-CM

## 2013-11-29 DIAGNOSIS — A499 Bacterial infection, unspecified: Secondary | ICD-10-CM

## 2013-11-29 LAB — POCT URINALYSIS DIPSTICK
Bilirubin, UA: NEGATIVE
Glucose, UA: NEGATIVE
NITRITE UA: POSITIVE
PH UA: 5.5
Spec Grav, UA: 1.02
Urobilinogen, UA: 0.2

## 2013-11-29 MED ORDER — NITROFURANTOIN MONOHYD MACRO 100 MG PO CAPS: 100.0000 mg | ORAL_CAPSULE | Freq: Two times a day (BID) | ORAL | Status: DC

## 2013-11-29 NOTE — Progress Notes (Signed)
Subjective:    Patient ID: Laurie [name], female    DOB: 21-Dec-1955, 58 y.o.   MRN: [id number]  Urinary Tract Infection  Associated symptoms include urgency. Pertinent negatives include no chills.   58 year old white female, smoker and then today with complaints of painful urination x1 week. Has a history of urinary tract infections and interstitial cystitis. Reports increased fatigue. Has lower abdominal pain frequency and urgency. Has taken medication over-the-counter. Has known antibiotic resistance to Cipro, Levaquin, and Bactrim.   Review of Systems  Constitutional: Positive for fatigue. Negative for fever, chills and unexpected weight change.  Respiratory: Negative.   Cardiovascular: Negative.   Gastrointestinal: Negative.   Endocrine: Negative.   Genitourinary: Positive for dysuria and urgency. Negative for vaginal discharge.  Musculoskeletal: Negative.   Skin: Negative.   Neurological: Negative.   Psychiatric/Behavioral: Negative.   All other systems reviewed and are negative.  Past Medical History  Diagnosis Date  . IBS (irritable bowel syndrome)   . Esophageal stricture   . Chronic back pain   . Depression   . Interstitial cystitis   . Allergy   . Anxiety   . GERD (gastroesophageal reflux disease)   . Heart murmur   . Hyperlipidemia   . Hypertension   . Neuromuscular disorder     fibromyalgia  . Asthma   . Arthritis     RA    History   Social History  . Marital Status: Single    Spouse Name: N/A    Number of Children: N/A  . Years of Education: N/A   Occupational History  . Not on file.   Social History Main Topics  . Smoking status: Current Every Day Smoker -- 1.00 packs/day    Types: Cigarettes  . Smokeless tobacco: Never Used  . Alcohol Use: No  . Drug Use: No  . Sexual Activity: Not on file   Other Topics Concern  . Not on file   Social History Narrative  . No narrative on file    Past Surgical History  Procedure Laterality  Date  . Abdominal hysterectomy    . Appendectomy    . Tonsillectomy    . Finger surgery    . Cervical laminectomy    . Cervical fusion    . Lumbar fusion    . Esophagogastroduodenoscopy      Family History  Problem Relation Age of Onset  . Dementia Mother   . Heart attack Father   . Coronary artery disease Father   . Cancer Brother     esophageal  . Esophageal cancer Brother   . Coronary artery disease Paternal Aunt   . Stomach cancer Paternal Aunt   . Coronary artery disease Paternal Grandmother   . Aneurysm Brother     aortic  . Rectal cancer Neg Hx   . Colon cancer Neg Hx     Allergies  Allergen Reactions  . Codeine     REACTION: Vomiting  . Lisinopril Cough  . Sulfonamide Derivatives     REACTION: Upset GI    Current Outpatient Prescriptions on File Prior to Visit  Medication Sig Dispense Refill  . HYDROcodone-acetaminophen (NORCO) 7.5-325 MG per tablet TAKE 1 TABLET BY MOUTH EVERY 8 HOURS AS NEEDED FOR PAIN  30 tablet  0  . losartan (COZAAR) 100 MG tablet Take 1 tablet (100 mg total) by mouth daily.  90 tablet  3  . venlafaxine XR (EFFEXOR-XR) 150 MG 24 hr capsule TAKE ONE CAPSULE BY MOUTH EVERY DAY  30 capsule  6   No current facility-administered medications on file prior to visit.    BP 118/78  Temp(Src) 98.3 F (36.8 C) (Oral)  Wt 172 lb (78.019 kg)chart    Objective:   Physical Exam  Constitutional: She is oriented to person, place, and time. She appears well-developed and well-nourished.  Neck: Normal range of motion. Neck supple.  Cardiovascular: Normal rate, regular rhythm and normal heart sounds.   Pulmonary/Chest: Effort normal and breath sounds normal.  Abdominal: Soft. Bowel sounds are normal.  Musculoskeletal: Normal range of motion.  Neurological: She is alert and oriented to person, place, and time.  Skin: Skin is warm and dry.  Psychiatric: She has a normal mood and affect.          Assessment & Plan:  [name] was seen today  for urinary tract infection.  Diagnoses and associated orders for this visit:  Dysuria - POCT Urine Dip  Bacterial UTI - Culture, Urine  Other Orders - nitrofurantoin, macrocrystal-monohydrate, (MACROBID) 100 MG capsule; Take 1 capsule (100 mg total) by mouth 2 (two) times daily.    Antibiotics twice daily. Drink plenty of water. Avoid caffeine.

## 2013-11-29 NOTE — Progress Notes (Signed)
Pre visit review using our clinic review tool, if applicable. No additional management support is needed unless otherwise documented below in the visit note. 

## 2013-11-29 NOTE — Patient Instructions (Signed)

## 2013-12-01 LAB — URINE CULTURE: Colony Count: >=100000

## 2013-12-02 ENCOUNTER — Ambulatory Visit: Payer: Medicare Other | Admitting: Family Medicine

## 2013-12-03 ENCOUNTER — Telehealth: Payer: Self-pay | Admitting: Internal Medicine

## 2013-12-03 NOTE — Telephone Encounter (Signed)
Patient would like her UA results if they are available please.

## 2013-12-06 NOTE — Telephone Encounter (Signed)
Pt aware again of Urine culture results and she is already taking her abx.  Pt now c/o congestion in chest and would like to know if something can be sent to pharmacy. Please advise

## 2013-12-06 NOTE — Telephone Encounter (Signed)
Pt seen [employer]

## 2013-12-06 NOTE — Telephone Encounter (Signed)
Mucinex OTC- as directed.

## 2013-12-10 ENCOUNTER — Ambulatory Visit: Payer: Medicare Other | Admitting: Family Medicine

## 2014-01-07 ENCOUNTER — Ambulatory Visit: Payer: Medicare Other | Admitting: Family

## 2014-01-26 ENCOUNTER — Ambulatory Visit: Payer: Medicare Other | Admitting: Family

## 2014-01-26 ENCOUNTER — Ambulatory Visit (INDEPENDENT_AMBULATORY_CARE_PROVIDER_SITE_OTHER): Payer: Medicare Other | Admitting: Family Medicine

## 2014-01-26 ENCOUNTER — Encounter: Payer: Self-pay | Admitting: Family Medicine

## 2014-01-26 VITALS — BP 148/93 | HR 94 | Temp 99.3°F | Ht 69.0 in | Wt 166.0 lb

## 2014-01-26 DIAGNOSIS — J209 Acute bronchitis, unspecified: Secondary | ICD-10-CM

## 2014-01-26 DIAGNOSIS — R079 Chest pain, unspecified: Secondary | ICD-10-CM

## 2014-01-26 DIAGNOSIS — R300 Dysuria: Secondary | ICD-10-CM

## 2014-01-26 LAB — POCT URINALYSIS DIPSTICK
Glucose, UA: NEGATIVE
LEUKOCYTES UA: NEGATIVE
NITRITE UA: NEGATIVE
Spec Grav, UA: 1.025
Urobilinogen, UA: 1
pH, UA: 7

## 2014-01-26 MED ORDER — HYDROCODONE-HOMATROPINE 5-1.5 MG/5ML PO SYRP: 5.0000 mL | ORAL_SOLUTION | ORAL | Status: DC | PRN

## 2014-01-26 MED ORDER — METHYLPREDNISOLONE ACETATE 80 MG/ML IJ SUSP
120.0000 mg | Freq: Once | INTRAMUSCULAR | Status: AC
  Administered 2014-01-26: 120 mg via INTRAMUSCULAR

## 2014-01-26 MED ORDER — AMOXICILLIN-POT CLAVULANATE 875-125 MG PO TABS: 1.0000 | ORAL_TABLET | Freq: Two times a day (BID) | ORAL | Status: DC

## 2014-01-26 NOTE — Progress Notes (Signed)
Pre visit review using our clinic review tool, if applicable. No additional management support is needed unless otherwise documented below in the visit note. 

## 2014-01-26 NOTE — Progress Notes (Signed)
   Subjective:    Patient ID: Laurie [name], female    DOB: [birth year], 58 y.o.   MRN: [id number]  HPI Here for 3 days of chest congestion and coughing up green sputum. On Mucinex . She also has some urinary burning this week. She took Bowen in October for a UTI.  She also has a hx of interstitial cystitis.    Review of Systems  Constitutional: Positive for fever and fatigue.  HENT: Positive for congestion. Negative for postnasal drip and sinus pressure.   Eyes: Negative.   Respiratory: Positive for cough, chest tightness, shortness of breath and wheezing.   Cardiovascular: Negative.   Genitourinary: Positive for dysuria. Negative for urgency, frequency, hematuria and flank pain.       Objective:   Physical Exam  Constitutional: She appears well-developed and well-nourished.  Ill appearing   HENT:  Right Ear: External ear normal.  Left Ear: External ear normal.  Nose: Nose normal.  Mouth/Throat: Oropharynx is clear and moist.  Eyes: Conjunctivae are normal.  Cardiovascular: Normal rate, regular rhythm, normal heart sounds and intact distal pulses.   EKG normal except for chronic anterior T wave inversions   Pulmonary/Chest: She has no rales.  Diffuse wheezes and rhonchi   Lymphadenopathy:    She has no cervical adenopathy.          Assessment & Plan:  Treat the bronchitis with a steroid shot and Augmentin. No evidence of a UTI at this time.

## 2014-01-26 NOTE — Addendum Note (Signed)
Addended by: Colleen Can on: 01/26/2014 04:08 PM   Modules accepted: Orders

## 2014-01-28 ENCOUNTER — Ambulatory Visit: Payer: Medicare Other | Admitting: Family

## 2014-01-28 ENCOUNTER — Telehealth: Payer: Self-pay | Admitting: Internal Medicine

## 2014-01-28 MED ORDER — METHYLPREDNISOLONE 4 MG PO KIT: PACK | ORAL | Status: DC

## 2014-01-28 NOTE — Telephone Encounter (Signed)
It takes some time for medications to work. She is on a powerful antibiotic already. Call in a Medrol dose pack

## 2014-01-28 NOTE — Telephone Encounter (Signed)
I spoke with pt and sent script e-scribe. 

## 2014-01-28 NOTE — Telephone Encounter (Signed)
Pt called to say that she still has chest congestion and is asking if she can have another shot or breathing treatment  She said she has bronchitis

## 2014-02-01 ENCOUNTER — Other Ambulatory Visit: Payer: Self-pay | Admitting: *Deleted

## 2014-02-01 MED ORDER — VENLAFAXINE HCL ER 150 MG PO CP24: 150.0000 mg | ORAL_CAPSULE | Freq: Every day | ORAL | Status: DC

## 2014-03-03 ENCOUNTER — Telehealth: Payer: Self-pay | Admitting: Internal Medicine

## 2014-03-03 NOTE — Telephone Encounter (Signed)
Consulted with Padonda and advised per her, pt can take the Welbutrin.  Pt is aware and has appointment scheduled for 03/14/14 at 10am

## 2014-03-03 NOTE — Telephone Encounter (Signed)
Patient states she doesn't get paid until 03/23/14 and can't afford venlafaxine XR (EFFEXOR-XR) 150 MG 24 hr capsule till then.  She took her last pill last night and wants to know if she can take her left over Welbutrin??

## 2014-03-03 NOTE — Telephone Encounter (Signed)
Patient Name: Laurie [name]  DOB: [birth year]    Initial Comment Caller states she is out of venlafaxine and can't afford to refill right now, wants to know if she can take the rest of wellbutrin that she has left over from before starting venlafaxine   Nurse Assessment  Nurse: Mallie Mussel, RN, Alveta Heimlich Date/Time Eilene Ghazi Time): 03/03/2014 12:36:17 PM  Confirm and document reason for call. If symptomatic, describe symptoms. ---Caller states she is out of venlafaxine and can't afford to refill right now, wants to know if she can take the rest of wellbutrin that she has left over from before starting venlafaxine. I did a warm transfer to [facility] for further assistance.  Has the patient traveled out of the country within the last 30 days? ---No  Does the patient require triage? ---No     Guidelines    Guideline Title Affirmed Question Affirmed Notes       Final Disposition User   Clinical Call Mallie Mussel, RN, Alveta Heimlich

## 2014-03-14 ENCOUNTER — Ambulatory Visit (INDEPENDENT_AMBULATORY_CARE_PROVIDER_SITE_OTHER): Payer: Medicare Other | Admitting: Family

## 2014-03-14 ENCOUNTER — Encounter: Payer: Self-pay | Admitting: Family

## 2014-03-14 VITALS — BP 100/72 | HR 101 | Temp 97.8°F | Ht 69.0 in | Wt 167.0 lb

## 2014-03-14 DIAGNOSIS — I10 Essential (primary) hypertension: Secondary | ICD-10-CM

## 2014-03-14 DIAGNOSIS — Z0000 Encounter for general adult medical examination without abnormal findings: Secondary | ICD-10-CM

## 2014-03-14 DIAGNOSIS — R221 Localized swelling, mass and lump, neck: Secondary | ICD-10-CM

## 2014-03-14 DIAGNOSIS — Z1239 Encounter for other screening for malignant neoplasm of breast: Secondary | ICD-10-CM

## 2014-03-14 LAB — CBC WITH DIFFERENTIAL/PLATELET
BASOS ABS: 0 10*3/uL (ref 0.0–0.1)
BASOS PCT: 0.4 % (ref 0.0–3.0)
EOS PCT: 2 % (ref 0.0–5.0)
Eosinophils Absolute: 0.2 10*3/uL (ref 0.0–0.7)
HEMATOCRIT: 42.4 % (ref 36.0–46.0)
HEMOGLOBIN: 14.3 g/dL (ref 12.0–15.0)
LYMPHS ABS: 2.6 10*3/uL (ref 0.7–4.0)
LYMPHS PCT: 24.4 % (ref 12.0–46.0)
MCHC: 33.6 g/dL (ref 30.0–36.0)
MCV: 88.3 fl (ref 78.0–100.0)
MONO ABS: 0.7 10*3/uL (ref 0.1–1.0)
Monocytes Relative: 6.5 % (ref 3.0–12.0)
NEUTROS ABS: 7.2 10*3/uL (ref 1.4–7.7)
NEUTROS PCT: 66.7 % (ref 43.0–77.0)
Platelets: 270 10*3/uL (ref 150.0–400.0)
RBC: 4.81 Mil/uL (ref 3.87–5.11)
RDW: 14.3 % (ref 11.5–15.5)
WBC: 10.8 10*3/uL — ABNORMAL HIGH (ref 4.0–10.5)

## 2014-03-14 LAB — POCT URINALYSIS DIPSTICK
Blood, UA: NEGATIVE
GLUCOSE UA: NEGATIVE
Leukocytes, UA: NEGATIVE
Nitrite, UA: NEGATIVE
SPEC GRAV UA: 1.025
Urobilinogen, UA: 0.2
pH, UA: 5.5

## 2014-03-14 LAB — LIPID PANEL
CHOL/HDL RATIO: 6
Cholesterol: 227 mg/dL — ABNORMAL HIGH (ref 0–200)
HDL: 40.9 mg/dL (ref >39.00)
LDL Cholesterol: 155 mg/dL — ABNORMAL HIGH (ref 0–99)
NonHDL: 186.1
Triglycerides: 158 mg/dL — ABNORMAL HIGH (ref 0.0–149.0)
VLDL: 31.6 mg/dL (ref 0.0–40.0)

## 2014-03-14 LAB — BASIC METABOLIC PANEL
BUN: 17 mg/dL (ref 6–23)
CO2: 26 mEq/L (ref 19–32)
Calcium: 9.7 mg/dL (ref 8.4–10.5)
Chloride: 105 mEq/L (ref 96–112)
Creatinine, Ser: 0.89 mg/dL (ref 0.40–1.20)
GFR: 68.92 mL/min (ref >60.00)
Glucose, Bld: 95 mg/dL (ref 70–99)
Potassium: 4 mEq/L (ref 3.5–5.1)
SODIUM: 138 meq/L (ref 135–145)

## 2014-03-14 LAB — HEPATIC FUNCTION PANEL
ALK PHOS: 77 U/L (ref 39–117)
ALT: 21 U/L (ref 0–35)
AST: 13 U/L (ref 0–37)
Albumin: 3.9 g/dL (ref 3.5–5.2)
BILIRUBIN DIRECT: 0.1 mg/dL (ref 0.0–0.3)
Total Bilirubin: 0.4 mg/dL (ref 0.2–1.2)
Total Protein: 7 g/dL (ref 6.0–8.3)

## 2014-03-14 LAB — TSH: TSH: 1.73 u[IU]/mL (ref 0.35–4.50)

## 2014-03-14 NOTE — Progress Notes (Signed)
Subjective:    Patient ID: Laurie [name], female    DOB: 1955-07-14, 59 y.o.   MRN: [id number]  HPI 59 yo white,  female, smoker,  in office today for a routine physical. Patient has complaints of episodes of sweating, dizziness, fatigue, and coughing off and on over the last several months.   She noticed a neck mass on the right side of her neck that is painful to touch and requested evaluation of the mass. Present x months. No relieving factors.  Has not taken anything for relief. In addition she thinks she has UTI. However, she has a history IC.   She is currently being treated at the pain clinic for lower back and neck pain.   Review of Systems  Constitutional: Positive for diaphoresis.  HENT: Negative.   Eyes: Negative.   Respiratory: Positive for cough.   Cardiovascular: Negative.   Gastrointestinal: Negative.   Endocrine: Positive for heat intolerance.  Genitourinary: Positive for dysuria.  Musculoskeletal: Positive for back pain.  Skin: Negative.   Allergic/Immunologic: Negative.   Neurological: Positive for dizziness.  Hematological: Negative.   Psychiatric/Behavioral: Negative.   . Past Medical History  Diagnosis Date  . IBS (irritable bowel syndrome)   . Esophageal stricture   . Chronic back pain   . Depression   . Interstitial cystitis   . Allergy   . Anxiety   . GERD (gastroesophageal reflux disease)   . Heart murmur   . Hyperlipidemia   . Hypertension   . Neuromuscular disorder     fibromyalgia  . Asthma   . Arthritis     RA    History   Social History  . Marital Status: Single    Spouse Name: N/A    Number of Children: N/A  . Years of Education: N/A   Occupational History  . Not on file.   Social History Main Topics  . Smoking status: Current Every Day Smoker    Types: Cigarettes  . Smokeless tobacco: Never Used     Comment: trying to quit   . Alcohol Use: No  . Drug Use: No  . Sexual Activity: Not on file   Other Topics Concern   . Not on file   Social History Narrative    Past Surgical History  Procedure Laterality Date  . Abdominal hysterectomy    . Appendectomy    . Tonsillectomy    . Finger surgery    . Cervical laminectomy    . Cervical fusion    . Lumbar fusion    . Esophagogastroduodenoscopy      Family History  Problem Relation Age of Onset  . Dementia Mother   . Heart attack Father   . Coronary artery disease Father   . Cancer Brother     esophageal  . Esophageal cancer Brother   . Coronary artery disease Paternal Aunt   . Stomach cancer Paternal Aunt   . Coronary artery disease Paternal Grandmother   . Aneurysm Brother     aortic  . Rectal cancer Neg Hx   . Colon cancer Neg Hx     Allergies  Allergen Reactions  . Codeine     REACTION: Vomiting  . Lisinopril Cough  . Sulfonamide Derivatives     REACTION: Upset GI    Current Outpatient Prescriptions on File Prior to Visit  Medication Sig Dispense Refill  . losartan (COZAAR) 100 MG tablet Take 1 tablet (100 mg total) by mouth daily. 90 tablet 3  .  venlafaxine XR (EFFEXOR-XR) 150 MG 24 hr capsule Take 1 capsule (150 mg total) by mouth daily. 30 capsule 1   No current facility-administered medications on file prior to visit.    BP 100/72 mmHg  Pulse 101  Temp(Src) 97.8 F (36.6 C) (Oral)  Ht 5\' 9"  (1.753 m)  Wt 167 lb (75.751 kg)  BMI 24.65 kg/m2chart     Objective:   Physical Exam  Constitutional: She is oriented to person, place, and time. She appears well-developed and well-nourished.  HENT:  Head: Normocephalic.  Eyes: Pupils are equal, round, and reactive to light.  Neck: Trachea normal and normal range of motion. Neck supple.    Neck mass noted anterior-laterally. Painful with palpation.   Cardiovascular: Normal rate, regular rhythm, normal heart sounds and intact distal pulses.   Pulmonary/Chest: Effort normal and breath sounds normal.  Abdominal: Soft. Bowel sounds are normal.  Musculoskeletal: Normal  range of motion.  Neurological: She is alert and oriented to person, place, and time. She has normal reflexes.  Skin: Skin is warm and dry.  Psychiatric: She has a normal mood and affect. Her behavior is normal. Judgment and thought content normal.          Assessment & Plan:  Laurie [name] was seen today for annual exam.  Diagnoses and associated orders for this visit:  Preventative health care - TSH - Lipid Panel - Basic Metabolic Panel - Cancel: Urinalysis - Hepatic Function Panel - CBC with Differential - POC Urinalysis Dipstick  Essential hypertension - TSH - Lipid Panel - Basic Metabolic Panel - Cancel: Urinalysis - Hepatic Function Panel - POC Urinalysis Dipstick  Neck mass - US Soft Tissue Head/Neck; Future - CBC with Differential  Other Orders - Cancel: CBC with Differential/Platelet; Standing - Cancel: CBC with Differential/Platelet   Mammogram ordered. Labs pending. Will follow-up regarding results.

## 2014-03-14 NOTE — Patient Instructions (Signed)
Smoking Cessation Quitting smoking is important to your health and has many advantages. However, it is not always easy to quit since nicotine is a very addictive drug. Oftentimes, people try 3 times or more before being able to quit. This document explains the best ways for you to prepare to quit smoking. Quitting takes hard work and a lot of effort, but you can do it. ADVANTAGES OF QUITTING SMOKING  You will live longer, feel better, and live better.  Your body will feel the impact of quitting smoking almost immediately.  Within 20 minutes, blood pressure decreases. Your pulse returns to its normal level.  After 8 hours, carbon monoxide levels in the blood return to normal. Your oxygen level increases.  After 24 hours, the chance of having a heart attack starts to decrease. Your breath, hair, and body stop smelling like smoke.  After 48 hours, damaged nerve endings begin to recover. Your sense of taste and smell improve.  After 72 hours, the body is virtually free of nicotine. Your bronchial tubes relax and breathing becomes easier.  After 2 to 12 weeks, lungs can hold more air. Exercise becomes easier and circulation improves.  The risk of having a heart attack, stroke, cancer, or lung disease is greatly reduced.  After 1 year, the risk of coronary heart disease is cut in half.  After 5 years, the risk of stroke falls to the same as a nonsmoker.  After 10 years, the risk of lung cancer is cut in half and the risk of other cancers decreases significantly.  After 15 years, the risk of coronary heart disease drops, usually to the level of a nonsmoker.  If you are pregnant, quitting smoking will improve your chances of having a healthy baby.  The people you live with, especially any children, will be healthier.  You will have extra money to spend on things other than cigarettes. QUESTIONS TO THINK ABOUT BEFORE ATTEMPTING TO QUIT You may want to talk about your answers with your  health care provider.  Why do you want to quit?  If you tried to quit in the past, what helped and what did not?  What will be the most difficult situations for you after you quit? How will you plan to handle them?  Who can help you through the tough times? Your family? Friends? A health care provider?  What pleasures do you get from smoking? What ways can you still get pleasure if you quit? Here are some questions to ask your health care provider:  How can you help me to be successful at quitting?  What medicine do you think would be best for me and how should I take it?  What should I do if I need more help?  What is smoking withdrawal like? How can I get information on withdrawal? GET READY  Set a quit date.  Change your environment by getting rid of all cigarettes, ashtrays, matches, and lighters in your home, car, or work. Do not let people smoke in your home.  Review your past attempts to quit. Think about what worked and what did not. GET SUPPORT AND ENCOURAGEMENT You have a better chance of being successful if you have help. You can get support in many ways.  Tell your family, friends, and coworkers that you are going to quit and need their support. Ask them not to smoke around you.  Get individual, group, or telephone counseling and support. Programs are available at local hospitals and health centers. Call   your local health department for information about programs in your area.  Spiritual beliefs and practices may help some smokers quit.  Download a "quit meter" on your computer to keep track of quit statistics, such as how long you have gone without smoking, cigarettes not smoked, and money saved.  Get a self-help book about quitting smoking and staying off tobacco. LEARN NEW SKILLS AND BEHAVIORS  Distract yourself from urges to smoke. Talk to someone, go for a walk, or occupy your time with a task.  Change your normal routine. Take a different route to work.  Drink tea instead of coffee. Eat breakfast in a different place.  Reduce your stress. Take a hot bath, exercise, or read a book.  Plan something enjoyable to do every day. Reward yourself for not smoking.  Explore interactive web-based programs that specialize in helping you quit. GET MEDICINE AND USE IT CORRECTLY Medicines can help you stop smoking and decrease the urge to smoke. Combining medicine with the above behavioral methods and support can greatly increase your chances of successfully quitting smoking.  Nicotine replacement therapy helps deliver nicotine to your body without the negative effects and risks of smoking. Nicotine replacement therapy includes nicotine gum, lozenges, inhalers, nasal sprays, and skin patches. Some may be available over-the-counter and others require a prescription.  Antidepressant medicine helps people abstain from smoking, but how this works is unknown. This medicine is available by prescription.  Nicotinic receptor partial agonist medicine simulates the effect of nicotine in your brain. This medicine is available by prescription. Ask your health care provider for advice about which medicines to use and how to use them based on your health history. Your health care provider will tell you what side effects to look out for if you choose to be on a medicine or therapy. Carefully read the information on the package. Do not use any other product containing nicotine while using a nicotine replacement product.  RELAPSE OR DIFFICULT SITUATIONS Most relapses occur within the first 3 months after quitting. Do not be discouraged if you start smoking again. Remember, most people try several times before finally quitting. You may have symptoms of withdrawal because your body is used to nicotine. You may crave cigarettes, be irritable, feel very hungry, cough often, get headaches, or have difficulty concentrating. The withdrawal symptoms are only temporary. They are strongest  when you first quit, but they will go away within 10-14 days. To reduce the chances of relapse, try to:  Avoid drinking alcohol. Drinking lowers your chances of successfully quitting.  Reduce the amount of caffeine you consume. Once you quit smoking, the amount of caffeine in your body increases and can give you symptoms, such as a rapid heartbeat, sweating, and anxiety.  Avoid smokers because they can make you want to smoke.  Do not let weight gain distract you. Many smokers will gain weight when they quit, usually less than 10 pounds. Eat a healthy diet and stay active. You can always lose the weight gained after you quit.  Find ways to improve your mood other than smoking. FOR MORE INFORMATION  www.smokefree.gov  Document Released: 01/22/2001 Document Revised: 06/14/2013 Document Reviewed: 05/09/2011 Exit[employer] Patient Information 2015 [employer]. This information is not intended to replace advice given to you by your health care provider. Make sure you discuss any questions you have with your health care provider.  

## 2014-03-14 NOTE — Progress Notes (Signed)
Pre visit review using our clinic review tool, if applicable. No additional management support is needed unless otherwise documented below in the visit note. 

## 2014-03-14 NOTE — Addendum Note (Signed)
Addended byRoxy Cedar B on: 03/14/2014 01:05 PM   Modules accepted: Level of Service

## 2014-03-21 ENCOUNTER — Telehealth: Payer: Self-pay | Admitting: Gastroenterology

## 2014-03-21 ENCOUNTER — Ambulatory Visit
Admission: RE | Admit: 2014-03-21 | Discharge: 2014-03-21 | Disposition: A | Discharge status: Home / Self Care | Payer: Medicare Other | Source: Ambulatory Visit | Attending: Family | Admitting: Family

## 2014-03-21 DIAGNOSIS — R221 Localized swelling, mass and lump, neck: Secondary | ICD-10-CM

## 2014-03-21 NOTE — Telephone Encounter (Signed)
Spoke with patient and told her the order for ultrasound that is scheduled includes her chest. She states she is having difficulty swallowing. Scheduled with Alonza Bogus, PA on 03/23/14 at 9:00 AM.(she states she cannot come until Wednesday when she gets paid).

## 2014-03-23 ENCOUNTER — Ambulatory Visit (INDEPENDENT_AMBULATORY_CARE_PROVIDER_SITE_OTHER): Payer: Medicare Other | Admitting: Gastroenterology

## 2014-03-23 ENCOUNTER — Encounter: Payer: Self-pay | Admitting: Gastroenterology

## 2014-03-23 VITALS — BP 120/90 | HR 84 | Ht 69.0 in | Wt 168.6 lb

## 2014-03-23 DIAGNOSIS — R079 Chest pain, unspecified: Secondary | ICD-10-CM | POA: Insufficient documentation

## 2014-03-23 DIAGNOSIS — K2100 Gastro-esophageal reflux disease with esophagitis, without bleeding: Secondary | ICD-10-CM | POA: Insufficient documentation

## 2014-03-23 DIAGNOSIS — R1314 Dysphagia, pharyngoesophageal phase: Secondary | ICD-10-CM | POA: Insufficient documentation

## 2014-03-23 MED ORDER — PANTOPRAZOLE SODIUM 40 MG PO TBEC: 40.0000 mg | DELAYED_RELEASE_TABLET | Freq: Two times a day (BID) | ORAL | Status: DC

## 2014-03-23 NOTE — Patient Instructions (Signed)
We have sent in your prescription to your pharmacy Follow up with Dr Deatra Ina on 05/05/2014 at 8:45am

## 2014-03-23 NOTE — Progress Notes (Signed)
     03/23/2014 Laurie [name] [id number] 03/18/1955   History of Present Illness:  This is a 59 year old female who is known to Dr. Deatra Ina for previous colonoscopy and EGD.  She had both of those performed in 08/2013.  The colonoscopy revealed two polyps (one tubular adenoma and one hyperplastic polyp), a lipoma in the ascending colon, and internal hemorrhoids.  EGD revealed reflux esophagitis and an esophageal stricture that was dilated.  She was supposed to be on PPI therapy, but is not currently taking any reflux medication and hasn't been for a while.    She presents to the office today with complaints of chest pain and intermittent difficult/painful swallowing.  She says that when she eats it has been hurting in her lower chest when the food goes down and gets hung up on occasion as well.  Says that it feels "raw" when the food goes down.  She also complains of chest pains that are severe and wake her from sleep in the middle of the night.  She says that the pain is in the center of her chest and radiates down both of her arms.  She says that she has almost called 911 a couple of times because the pain is so severe.  She says that she had an EKG a couple of months ago at her PCP's office and it was ok, but she does have a history of "blockages" in her heart.  She denies actually feeling any reflux sensation.   Current Medications, Allergies, Past Medical History, Past Surgical History, Family History and Social History were reviewed in [employer] record.   Physical Exam: BP 120/90 mmHg  Pulse 84  Ht 5\' 9"  (1.753 m)  Wt 168 lb 9.6 oz (76.476 kg)  BMI 24.89 kg/m2 General: Well developed white female in no acute distress Head: Normocephalic and atraumatic Eyes:  Sclerae anicteric, conjunctiva pink  Ears: Normal auditory acuity Lungs: Clear throughout to auscultation Heart: Regular rate and rhythm Abdomen: Soft, non-distended.  Normal bowel sounds.  Mild  epigastric TTP without R/R/G. Musculoskeletal: Symmetrical with no gross deformities  Extremities: No edema  Neurological: Alert oriented x 4, grossly non-focal Psychological:  Alert and cooperative. Normal mood and affect  Assessment and Recommendations: -Chest pain, dysphagia:  Patient has history of reflux esophagitis and esophageal stricture.  She has not been on PPI therapy.  Her esophagitis/reflux is likely causing some of her symptoms, but some of her chest pain complaints do not sound reflux related.  She does apparently have history of cardiac issues as well.  I do not think that she needs repeat EGD at this time since she just had one in 08/2013.  I am going to place her pantoprazole 40 mg BID for now and bring her back in 4-6 weeks for reassessment of her symptoms.  I have also asked her to see her cardiologist in the interim as well to rule out any cardiac issues as cause for some of her symptoms.

## 2014-03-23 NOTE — Progress Notes (Signed)
Reviewed and agree with management.  She has recurrent dysphagia I would proceed with a barium swallow Sandy Salaam. Deatra Ina, M.D., [facility]

## 2014-03-24 ENCOUNTER — Telehealth: Payer: Self-pay | Admitting: Family

## 2014-03-24 NOTE — Telephone Encounter (Signed)
Pt would like a call back w/ results of ultrasound.

## 2014-03-25 NOTE — Telephone Encounter (Signed)
Pt aware of results. See results note

## 2014-04-30 ENCOUNTER — Emergency Department (HOSPITAL_COMMUNITY)
Admission: EM | Admit: 2014-04-30 | Discharge: 2014-04-30 | Disposition: A | Discharge status: Home / Self Care | Payer: Medicare Other | Attending: Emergency Medicine | Admitting: Emergency Medicine

## 2014-04-30 ENCOUNTER — Encounter (HOSPITAL_COMMUNITY): Payer: Self-pay | Admitting: Oncology

## 2014-04-30 DIAGNOSIS — Z8739 Personal history of other diseases of the musculoskeletal system and connective tissue: Secondary | ICD-10-CM | POA: Insufficient documentation

## 2014-04-30 DIAGNOSIS — Z79899 Other long term (current) drug therapy: Secondary | ICD-10-CM | POA: Diagnosis not present

## 2014-04-30 DIAGNOSIS — K529 Noninfective gastroenteritis and colitis, unspecified: Secondary | ICD-10-CM | POA: Insufficient documentation

## 2014-04-30 DIAGNOSIS — Z720 Tobacco use: Secondary | ICD-10-CM | POA: Diagnosis not present

## 2014-04-30 DIAGNOSIS — Z9089 Acquired absence of other organs: Secondary | ICD-10-CM | POA: Diagnosis not present

## 2014-04-30 DIAGNOSIS — Z9889 Other specified postprocedural states: Secondary | ICD-10-CM | POA: Insufficient documentation

## 2014-04-30 DIAGNOSIS — R011 Cardiac murmur, unspecified: Secondary | ICD-10-CM | POA: Diagnosis not present

## 2014-04-30 DIAGNOSIS — Z90710 Acquired absence of both cervix and uterus: Secondary | ICD-10-CM | POA: Insufficient documentation

## 2014-04-30 DIAGNOSIS — J45909 Unspecified asthma, uncomplicated: Secondary | ICD-10-CM | POA: Insufficient documentation

## 2014-04-30 DIAGNOSIS — R109 Unspecified abdominal pain: Secondary | ICD-10-CM | POA: Diagnosis present

## 2014-04-30 DIAGNOSIS — F329 Major depressive disorder, single episode, unspecified: Secondary | ICD-10-CM | POA: Diagnosis not present

## 2014-04-30 DIAGNOSIS — Z87448 Personal history of other diseases of urinary system: Secondary | ICD-10-CM | POA: Diagnosis not present

## 2014-04-30 DIAGNOSIS — Z8639 Personal history of other endocrine, nutritional and metabolic disease: Secondary | ICD-10-CM | POA: Diagnosis not present

## 2014-04-30 DIAGNOSIS — K219 Gastro-esophageal reflux disease without esophagitis: Secondary | ICD-10-CM | POA: Diagnosis not present

## 2014-04-30 DIAGNOSIS — R112 Nausea with vomiting, unspecified: Secondary | ICD-10-CM

## 2014-04-30 DIAGNOSIS — G8929 Other chronic pain: Secondary | ICD-10-CM | POA: Diagnosis not present

## 2014-04-30 DIAGNOSIS — F419 Anxiety disorder, unspecified: Secondary | ICD-10-CM | POA: Insufficient documentation

## 2014-04-30 DIAGNOSIS — R197 Diarrhea, unspecified: Secondary | ICD-10-CM

## 2014-04-30 LAB — COMPREHENSIVE METABOLIC PANEL
ALBUMIN: 4.3 g/dL (ref 3.5–5.2)
ALK PHOS: 80 U/L (ref 39–117)
ALT: 21 U/L (ref 0–35)
ANION GAP: 12 (ref 5–15)
AST: 23 U/L (ref 0–37)
BUN: 15 mg/dL (ref 6–23)
CHLORIDE: 103 mmol/L (ref 96–112)
CO2: 24 mmol/L (ref 19–32)
Calcium: 10 mg/dL (ref 8.4–10.5)
Creatinine, Ser: 0.83 mg/dL (ref 0.50–1.10)
GFR calc Af Amer: 88 mL/min — ABNORMAL LOW (ref >90)
GFR, EST NON AFRICAN AMERICAN: 76 mL/min — AB (ref >90)
Glucose, Bld: 141 mg/dL — ABNORMAL HIGH (ref 70–99)
Potassium: 3.8 mmol/L (ref 3.5–5.1)
Sodium: 139 mmol/L (ref 135–145)
Total Bilirubin: 0.6 mg/dL (ref 0.3–1.2)
Total Protein: 8.1 g/dL (ref 6.0–8.3)

## 2014-04-30 LAB — CBC WITH DIFFERENTIAL/PLATELET
Basophils Absolute: 0 10*3/uL (ref 0.0–0.1)
Basophils Relative: 0 % (ref 0–1)
Eosinophils Absolute: 0 10*3/uL (ref 0.0–0.7)
Eosinophils Relative: 0 % (ref 0–5)
HCT: 43.4 % (ref 36.0–46.0)
Hemoglobin: 14.6 g/dL (ref 12.0–15.0)
Lymphocytes Relative: 4 % — ABNORMAL LOW (ref 12–46)
Lymphs Abs: 0.5 10*3/uL — ABNORMAL LOW (ref 0.7–4.0)
MCH: 30.4 pg (ref 26.0–34.0)
MCHC: 33.6 g/dL (ref 30.0–36.0)
MCV: 90.2 fL (ref 78.0–100.0)
Monocytes Absolute: 0.6 10*3/uL (ref 0.1–1.0)
Monocytes Relative: 4 % (ref 3–12)
Neutro Abs: 13.6 10*3/uL — ABNORMAL HIGH (ref 1.7–7.7)
Neutrophils Relative %: 92 % — ABNORMAL HIGH (ref 43–77)
Platelets: 298 10*3/uL (ref 150–400)
RBC: 4.81 MIL/uL (ref 3.87–5.11)
RDW: 13.6 % (ref 11.5–15.5)
WBC: 14.8 10*3/uL — ABNORMAL HIGH (ref 4.0–10.5)

## 2014-04-30 LAB — LIPASE, BLOOD: LIPASE: 27 U/L (ref 11–59)

## 2014-04-30 MED ORDER — SODIUM CHLORIDE 0.9 % IV BOLUS (SEPSIS)
1000.0000 mL | Freq: Once | INTRAVENOUS | Status: AC
  Administered 2014-04-30: 1000 mL via INTRAVENOUS

## 2014-04-30 MED ORDER — PANTOPRAZOLE SODIUM 40 MG IV SOLR
40.0000 mg | Freq: Once | INTRAVENOUS | Status: AC
  Administered 2014-04-30: 40 mg via INTRAVENOUS
  Filled 2014-04-30: qty 40

## 2014-04-30 MED ORDER — LORAZEPAM 2 MG/ML IJ SOLN
1.0000 mg | Freq: Once | INTRAMUSCULAR | Status: AC
  Administered 2014-04-30: 1 mg via INTRAVENOUS
  Filled 2014-04-30: qty 1

## 2014-04-30 MED ORDER — ONDANSETRON 8 MG PO TBDP: 8.0000 mg | ORAL_TABLET | Freq: Three times a day (TID) | ORAL | Status: DC | PRN

## 2014-04-30 MED ORDER — ONDANSETRON HCL 4 MG/2ML IJ SOLN
4.0000 mg | Freq: Once | INTRAMUSCULAR | Status: AC
  Administered 2014-04-30: 4 mg via INTRAVENOUS
  Filled 2014-04-30: qty 2

## 2014-04-30 NOTE — ED Notes (Signed)
Nurse drawing labs. 

## 2014-04-30 NOTE — ED Notes (Signed)
Bed: NK53 Expected date:  Expected time:  Means of arrival:  Comments: 59 yo F  N/V/D

## 2014-04-30 NOTE — ED Notes (Signed)
Patient resting in position of comfort with eyes closed after admin of Ativan, see MAR RR WNL--even and unlabored with equal rise and fall of chest Patient in NAD Side rails up, call bell in reach

## 2014-04-30 NOTE — ED Notes (Signed)
Pt given ginger ale and is tolerating w/o difficulty.  Pt has had no emesis or diarrhea since arrival to the ED.  Pt ambulated to the bathroom independently w/ a steady gait.

## 2014-04-30 NOTE — Discharge Instructions (Signed)
It was our pleasure to provide your ER care today - we hope that you feel better.  Rest. Drink plenty of fluids.  Take zofran as need for nausea.  Follow up with primary care doctor this Monday for recheck if symptoms fail to improve/resolve.  Return to ER right away if worse, new symptoms, persistent vomiting, worsening or severe abdominal pain, other concern.  You were given medication that can cause drowsiness - no driving for the next 4 hours.     Nausea and Vomiting Nausea is a sick feeling that often comes before throwing up (vomiting). Vomiting is a reflex where stomach contents come out of your mouth. Vomiting can cause severe loss of body fluids (dehydration). Children and elderly adults can become dehydrated quickly, especially if they also have diarrhea. Nausea and vomiting are symptoms of a condition or disease. It is important to find the cause of your symptoms. CAUSES   Direct irritation of the stomach lining. This irritation can result from increased acid production (gastroesophageal reflux disease), infection, food poisoning, taking certain medicines (such as nonsteroidal anti-inflammatory drugs), alcohol use, or tobacco use.  Signals from the brain.These signals could be caused by a headache, heat exposure, an inner ear disturbance, increased pressure in the brain from injury, infection, a tumor, or a concussion, pain, emotional stimulus, or metabolic problems.  An obstruction in the gastrointestinal tract (bowel obstruction).  Illnesses such as diabetes, hepatitis, gallbladder problems, appendicitis, kidney problems, cancer, sepsis, atypical symptoms of a heart attack, or eating disorders.  Medical treatments such as chemotherapy and radiation.  Receiving medicine that makes you sleep (general anesthetic) during surgery. DIAGNOSIS Your caregiver may ask for tests to be done if the problems do not improve after a few days. Tests may also be done if symptoms are severe  or if the reason for the nausea and vomiting is not clear. Tests may include:  Urine tests.  Blood tests.  Stool tests.  Cultures (to look for evidence of infection).  X-rays or other imaging studies. Test results can help your caregiver make decisions about treatment or the need for additional tests. TREATMENT You need to stay well hydrated. Drink frequently but in small amounts.You may wish to drink water, sports drinks, clear broth, or eat frozen ice pops or gelatin dessert to help stay hydrated.When you eat, eating slowly may help prevent nausea.There are also some antinausea medicines that may help prevent nausea. HOME CARE INSTRUCTIONS   Take all medicine as directed by your caregiver.  If you do not have an appetite, do not force yourself to eat. However, you must continue to drink fluids.  If you have an appetite, eat a normal diet unless your caregiver tells you differently.  Eat a variety of complex carbohydrates (rice, wheat, potatoes, bread), lean meats, yogurt, fruits, and vegetables.  Avoid high-fat foods because they are more difficult to digest.  Drink enough water and fluids to keep your urine clear or pale yellow.  If you are dehydrated, ask your caregiver for specific rehydration instructions. Signs of dehydration may include:  Severe thirst.  Dry lips and mouth.  Dizziness.  Dark urine.  Decreasing urine frequency and amount.  Confusion.  Rapid breathing or pulse. SEEK IMMEDIATE MEDICAL CARE IF:   You have blood or brown flecks (like coffee grounds) in your vomit.  You have black or bloody stools.  You have a severe headache or stiff neck.  You are confused.  You have severe abdominal pain.  You have chest pain  or trouble breathing.  You do not urinate at least once every 8 hours.  You develop cold or clammy skin.  You continue to vomit for longer than 24 to 48 hours.  You have a fever. MAKE SURE YOU:   Understand these  instructions.  Will watch your condition.  Will get help right away if you are not doing well or get worse. Document Released: 01/28/2005 Document Revised: 04/22/2011 Document Reviewed: 06/27/2010 [facility] Patient Information 2015 [place], [place]. This information is not intended to replace advice given to you by your health care provider. Make sure you discuss any questions you have with your health care provider.    Diarrhea Diarrhea is frequent loose and watery bowel movements. It can cause you to feel weak and dehydrated. Dehydration can cause you to become tired and thirsty, have a dry mouth, and have decreased urination that often is dark yellow. Diarrhea is a sign of another problem, most often an infection that will not last long. In most cases, diarrhea typically lasts 2-3 days. However, it can last longer if it is a sign of something more serious. It is important to treat your diarrhea as directed by your caregiver to lessen or prevent future episodes of diarrhea. CAUSES  Some common causes include:  Gastrointestinal infections caused by viruses, bacteria, or parasites.  Food poisoning or food allergies.  Certain medicines, such as antibiotics, chemotherapy, and laxatives.  Artificial sweeteners and fructose.  Digestive disorders. HOME CARE INSTRUCTIONS  Ensure adequate fluid intake (hydration): Have 1 cup (8 oz) of fluid for each diarrhea episode. Avoid fluids that contain simple sugars or sports drinks, fruit juices, whole milk products, and sodas. Your urine should be clear or pale yellow if you are drinking enough fluids. Hydrate with an oral rehydration solution that you can purchase at pharmacies, retail stores, and online. You can prepare an oral rehydration solution at home by mixing the following ingredients together:   - tsp table salt.   tsp baking soda.   tsp salt substitute containing potassium chloride.  1  tablespoons sugar.  1 L (34 oz) of  water.  Certain foods and beverages may increase the speed at which food moves through the gastrointestinal (GI) tract. These foods and beverages should be avoided and include:  Caffeinated and alcoholic beverages.  High-fiber foods, such as raw fruits and vegetables, nuts, seeds, and whole grain breads and cereals.  Foods and beverages sweetened with sugar alcohols, such as xylitol, sorbitol, and mannitol.  Some foods may be well tolerated and may help thicken stool including:  Starchy foods, such as rice, toast, pasta, low-sugar cereal, oatmeal, grits, baked potatoes, crackers, and bagels.  Bananas.  Applesauce.  Add probiotic-rich foods to help increase healthy bacteria in the GI tract, such as yogurt and fermented milk products.  Wash your hands well after each diarrhea episode.  Only take over-the-counter or prescription medicines as directed by your caregiver.  Take a warm bath to relieve any burning or pain from frequent diarrhea episodes. SEEK IMMEDIATE MEDICAL CARE IF:   You are unable to keep fluids down.  You have persistent vomiting.  You have blood in your stool, or your stools are black and tarry.  You do not urinate in 6-8 hours, or there is only a small amount of very dark urine.  You have abdominal pain that increases or localizes.  You have weakness, dizziness, confusion, or light-headedness.  You have a severe headache.  Your diarrhea gets worse or does not get  better.  You have a fever or persistent symptoms for more than 2-3 days.  You have a fever and your symptoms suddenly get worse. MAKE SURE YOU:   Understand these instructions.  Will watch your condition.  Will get help right away if you are not doing well or get worse. Document Released: 01/18/2002 Document Revised: 06/14/2013 Document Reviewed: 10/06/2011 [facility] Patient Information 2015 [place], [place]. This information is not intended to replace advice given to you by your health  care provider. Make sure you discuss any questions you have with your health care provider.     Viral Gastroenteritis Viral gastroenteritis is also known as stomach flu. This condition affects the stomach and intestinal tract. It can cause sudden diarrhea and vomiting. The illness typically lasts 3 to 8 days. Most people develop an immune response that eventually gets rid of the virus. While this natural response develops, the virus can make you quite ill. CAUSES  Many different viruses can cause gastroenteritis, such as rotavirus or noroviruses. You can catch one of these viruses by consuming contaminated food or water. You may also catch a virus by sharing utensils or other personal items with an infected person or by touching a contaminated surface. SYMPTOMS  The most common symptoms are diarrhea and vomiting. These problems can cause a severe loss of body fluids (dehydration) and a body salt (electrolyte) imbalance. Other symptoms may include:  Fever.  Headache.  Fatigue.  Abdominal pain. DIAGNOSIS  Your caregiver can usually diagnose viral gastroenteritis based on your symptoms and a physical exam. A stool sample may also be taken to test for the presence of viruses or other infections. TREATMENT  This illness typically goes away on its own. Treatments are aimed at rehydration. The most serious cases of viral gastroenteritis involve vomiting so severely that you are not able to keep fluids down. In these cases, fluids must be given through an intravenous line (IV). HOME CARE INSTRUCTIONS   Drink enough fluids to keep your urine clear or pale yellow. Drink small amounts of fluids frequently and increase the amounts as tolerated.  Ask your caregiver for specific rehydration instructions.  Avoid:  Foods high in sugar.  Alcohol.  Carbonated drinks.  Tobacco.  Juice.  Caffeine drinks.  Extremely hot or cold fluids.  Fatty, greasy foods.  Too much intake of anything at  one time.  Dairy products until 24 to 48 hours after diarrhea stops.  You may consume probiotics. Probiotics are active cultures of beneficial bacteria. They may lessen the amount and number of diarrheal stools in adults. Probiotics can be found in yogurt with active cultures and in supplements.  Wash your hands well to avoid spreading the virus.  Only take over-the-counter or prescription medicines for pain, discomfort, or fever as directed by your caregiver. Do not give aspirin to children. Antidiarrheal medicines are not recommended.  Ask your caregiver if you should continue to take your regular prescribed and over-the-counter medicines.  Keep all follow-up appointments as directed by your caregiver. SEEK IMMEDIATE MEDICAL CARE IF:   You are unable to keep fluids down.  You do not urinate at least once every 6 to 8 hours.  You develop shortness of breath.  You notice blood in your stool or vomit. This may look like coffee grounds.  You have abdominal pain that increases or is concentrated in one small area (localized).  You have persistent vomiting or diarrhea.  You have a fever.  The patient is a child younger than 3  months, and he or she has a fever.  The patient is a child older than 3 months, and he or she has a fever and persistent symptoms.  The patient is a child older than 3 months, and he or she has a fever and symptoms suddenly get worse.  The patient is a baby, and he or she has no tears when crying. MAKE SURE YOU:   Understand these instructions.  Will watch your condition.  Will get help right away if you are not doing well or get worse. Document Released: 01/28/2005 Document Revised: 04/22/2011 Document Reviewed: 11/14/2010 [facility] Patient Information 2015 [place], [place]. This information is not intended to replace advice given to you by your health care provider. Make sure you discuss any questions you have with your health care  provider.    Norovirus Infection Norovirus illness is caused by a viral infection. The term norovirus refers to a group of viruses. Any of those viruses can cause norovirus illness. This illness is often referred to by other names such as viral gastroenteritis, stomach flu, and food poisoning. Anyone can get a norovirus infection. People can have the illness multiple times during their lifetime. CAUSES  Norovirus is found in the stool or vomit of infected people. It is easily spread from person to person (contagious). People with norovirus are contagious from the moment they begin feeling ill. They may remain contagious for as long as 3 days to 2 weeks after recovery. People can become infected with the virus in several ways. This includes:  Eating food or drinking liquids that are contaminated with norovirus.  Touching surfaces or objects contaminated with norovirus, and then placing your hand in your mouth.  Having direct contact with a person who is infected and shows symptoms. This may occur while caring for someone with illness or while sharing foods or eating utensils with someone who is ill. SYMPTOMS  Symptoms usually begin 1 to 2 days after ingestion of the virus. Symptoms may include:  Nausea.  Vomiting.  Diarrhea.  Stomach cramps.  Low-grade fever.  Chills.  Headache.  Muscle aches.  Tiredness. Most people with norovirus illness get better within 1 to 2 days. Some people become dehydrated because they cannot drink enough liquids to replace those lost from vomiting and diarrhea. This is especially true for young children, the elderly, and others who are unable to care for themselves. DIAGNOSIS  Diagnosis is based on your symptoms and exam. Currently, only state public health laboratories have the ability to test for norovirus in stool or vomit. TREATMENT  No specific treatment exists for norovirus infections. No vaccine is available to prevent infections. Norovirus  illness is usually brief in healthy people. If you are ill with vomiting and diarrhea, you should drink enough water and fluids to keep your urine clear or pale yellow. Dehydration is the most serious health effect that can result from this infection. By drinking oral rehydration solution (ORS), people can reduce their chance of becoming dehydrated. There are many commercially available pre-made and powdered ORS designed to safely rehydrate people. These may be recommended by your caregiver. Replace any new fluid losses from diarrhea or vomiting with ORS as follows:  If your child weighs 10 kg or less (22 lb or less), give 60 to 120 ml ( to  cup or 2 to 4 oz) of ORS for each diarrheal stool or vomiting episode.  If your child weighs more than 10 kg (more than 22 lb), give 120 to 240 ml (  to 1 cup or 4 to 8 oz) of ORS for each diarrheal stool or vomiting episode. HOME CARE INSTRUCTIONS   Follow all your caregiver's instructions.  Avoid sugar-free and alcoholic drinks while ill.  Only take over-the-counter or prescription medicines for pain, vomiting, diarrhea, or fever as directed by your caregiver. You can decrease your chances of coming in contact with norovirus or spreading it by following these steps:  Frequently wash your hands, especially after using the toilet, changing diapers, and before eating or preparing food.  Carefully wash fruits and vegetables. Cook shellfish before eating them.  Do not prepare food for others while you are infected and for at least 3 days after recovering from illness.  Thoroughly clean and disinfect contaminated surfaces immediately after an episode of illness using a bleach-based household cleaner.  Immediately remove and wash clothing or linens that may be contaminated with the virus.  Use the toilet to dispose of any vomit or stool. Make sure the surrounding area is kept clean.  Food that may have been contaminated by an ill person should be  discarded. SEEK IMMEDIATE MEDICAL CARE IF:   You develop symptoms of dehydration that do not improve with fluid replacement. This may include:  Excessive sleepiness.  Lack of tears.  Dry mouth.  Dizziness when standing.  Weak pulse. Document Released: 04/20/2002 Document Revised: 04/22/2011 Document Reviewed: 05/22/2009 [facility] Patient Information 2015 [place], [place]. This information is not intended to replace advice given to you by your health care provider. Make sure you discuss any questions you have with your health care provider.

## 2014-04-30 NOTE — ED Provider Notes (Signed)
CSN: [id number]     Arrival date & time 04/30/14  1941 History   First MD Initiated Contact with Patient 04/30/14 1951     Chief Complaint  Patient presents with  . Abdominal Pain    N/V     (Consider location/radiation/quality/duration/timing/severity/associated sxs/prior Treatment) Patient is a 59 y.o. female presenting with abdominal pain. The history is provided by the patient.  Abdominal Pain Associated symptoms: diarrhea and vomiting   Associated symptoms: no chest pain, no chills, no dysuria, no fever, no shortness of breath and no sore throat   pt c/o nvd, and abd cramping since approximately 3 am today. Several episodes of diarrhea stools. Not bloody. Vomiting clear or color ingested fluids, not bloody or bilious. Diffuse, intermittent abd cramping, no focal or constant abd pain. No back or flank pain. No dysuria or gu c/o. No fever or chills. Family members w recent similar symptoms. No recent travel, bad food ingestion, or antibiotic use. No cough or uri c/o.     Past Medical History  Diagnosis Date  . IBS (irritable bowel syndrome)   . Esophageal stricture   . Chronic back pain   . Depression   . Interstitial cystitis   . Allergy   . Anxiety   . GERD (gastroesophageal reflux disease)   . Heart murmur   . Hyperlipidemia   . Hypertension   . Neuromuscular disorder     fibromyalgia  . Asthma   . Arthritis     RA   Past Surgical History  Procedure Laterality Date  . Abdominal hysterectomy    . Appendectomy    . Tonsillectomy    . Finger surgery    . Cervical laminectomy    . Cervical fusion    . Lumbar fusion    . Esophagogastroduodenoscopy     Family History  Problem Relation Age of Onset  . Dementia Mother   . Heart attack Father   . Coronary artery disease Father   . Cancer Brother     esophageal  . Esophageal cancer Brother   . Coronary artery disease Paternal Aunt   . Stomach cancer Paternal Aunt   . Coronary artery disease Paternal Grandmother    . Aneurysm Brother     aortic  . Rectal cancer Neg Hx   . Colon cancer Neg Hx    History  Substance Use Topics  . Smoking status: Current Every Day Smoker    Types: Cigarettes  . Smokeless tobacco: Never Used     Comment: trying to quit   . Alcohol Use: No   OB History    No data available     Review of Systems  Constitutional: Negative for fever and chills.  HENT: Negative for sore throat.   Eyes: Negative for redness.  Respiratory: Negative for shortness of breath.   Cardiovascular: Negative for chest pain.  Gastrointestinal: Positive for vomiting, abdominal pain and diarrhea.  Endocrine: Negative for polyuria.  Genitourinary: Negative for dysuria and flank pain.  Musculoskeletal: Negative for back pain and neck pain.  Skin: Negative for rash.  Neurological: Negative for headaches.  Hematological: Does not bruise/bleed easily.  Psychiatric/Behavioral: Negative for confusion.      Allergies  Codeine; Lisinopril; and Sulfonamide derivatives  Home Medications   Prior to Admission medications   Medication Sig Start Date End Date Taking? Authorizing Provider  losartan (COZAAR) 100 MG tablet Take 1 tablet (100 mg total) by mouth daily. 05/21/13   Ricard Dillon, MD  Oxycodone HCl 10  MG TABS  02/17/14   Historical Provider, MD  pantoprazole (PROTONIX) 40 MG tablet Take 1 tablet (40 mg total) by mouth 2 (two) times daily. 03/23/14   Laban Emperor Zehr, PA-C  venlafaxine XR (EFFEXOR-XR) 150 MG 24 hr capsule Take 1 capsule (150 mg total) by mouth daily. 02/01/14   Ricard Dillon, MD   BP 165/66 mmHg  Pulse 81  Temp(Src) 98.3 F (36.8 C) (Oral)  Resp 16  Ht 5\' 10"  (1.778 m)  Wt 166 lb (75.297 kg)  BMI 23.82 kg/m2  SpO2 100% Physical Exam  Constitutional: She is oriented to person, place, and time. She appears well-developed and well-nourished. No distress.  HENT:  Mouth/Throat: Oropharynx is clear and moist.  Eyes: Conjunctivae are normal. No scleral icterus.  Neck:  Neck supple. No tracheal deviation present.  Cardiovascular: Normal rate, regular rhythm, normal heart sounds and intact distal pulses.   Pulmonary/Chest: Effort normal and breath sounds normal. No respiratory distress.  Abdominal: Soft. Normal appearance and bowel sounds are normal. She exhibits no distension and no mass. There is tenderness. There is no rebound and no guarding.  Epigastric tenderness.   Genitourinary:  No cva tenderness  Musculoskeletal: She exhibits no edema or tenderness.  Neurological: She is alert and oriented to person, place, and time.  Skin: Skin is warm and dry. No rash noted.  Psychiatric:  anxious  Nursing note and vitals reviewed.   ED Course  Procedures (including critical care time) Labs Review  Results for orders placed or performed during the hospital encounter of 04/30/14  CBC with Differential  Result Value Ref Range   WBC 14.8 (H) 4.0 - 10.5 K/uL   RBC 4.81 3.87 - 5.11 MIL/uL   Hemoglobin 14.6 12.0 - 15.0 g/dL   HCT 43.4 36.0 - 46.0 %   MCV 90.2 78.0 - 100.0 fL   MCH 30.4 26.0 - 34.0 pg   MCHC 33.6 30.0 - 36.0 g/dL   RDW 13.6 11.5 - 15.5 %   Platelets 298 150 - 400 K/uL   Neutrophils Relative % 92 (H) 43 - 77 %   Neutro Abs 13.6 (H) 1.7 - 7.7 K/uL   Lymphocytes Relative 4 (L) 12 - 46 %   Lymphs Abs 0.5 (L) 0.7 - 4.0 K/uL   Monocytes Relative 4 3 - 12 %   Monocytes Absolute 0.6 0.1 - 1.0 K/uL   Eosinophils Relative 0 0 - 5 %   Eosinophils Absolute 0.0 0.0 - 0.7 K/uL   Basophils Relative 0 0 - 1 %   Basophils Absolute 0.0 0.0 - 0.1 K/uL  Comprehensive metabolic panel  Result Value Ref Range   Sodium 139 135 - 145 mmol/L   Potassium 3.8 3.5 - 5.1 mmol/L   Chloride 103 96 - 112 mmol/L   CO2 24 19 - 32 mmol/L   Glucose, Bld 141 (H) 70 - 99 mg/dL   BUN 15 6 - 23 mg/dL   Creatinine, Ser 0.83 0.50 - 1.10 mg/dL   Calcium 10.0 8.4 - 10.5 mg/dL   Total Protein 8.1 6.0 - 8.3 g/dL   Albumin 4.3 3.5 - 5.2 g/dL   AST 23 0 - 37 U/L   ALT 21 0  - 35 U/L   Alkaline Phosphatase 80 39 - 117 U/L   Total Bilirubin 0.6 0.3 - 1.2 mg/dL   GFR calc non Af Amer 76 (L) >90 mL/min   GFR calc Af Amer 88 (L) >90 mL/min   Anion gap 12 5 -  15  Lipase, blood  Result Value Ref Range   Lipase 27 11 - 59 U/L      MDM   Iv ns bolus. protonix iv. zofran iv. Pt anxious. Ativan 1 mg iv.  Reviewed nursing notes and prior charts for additional history.   Recheck pt calm and alert. No nausea, vomiting or diarrhea in ED.  Recheck abd soft nt.  Recheck pt resting, easily aroused. Denies pain or nv.   abd remains soft nt.  Pt tolerating po fluids and currently appears stable for d/c.  Return precautions provided.      Lajean Saver, MD 05/01/14 [phone]

## 2014-04-30 NOTE — ED Notes (Signed)
Per EMS pt has had abdominal pain, nausea,vomiting and diarrhea since 0300 this am.   Multiple people in the house with similar sx.  Pt has taken phenergan and Pepto w/o relief.

## 2014-05-05 ENCOUNTER — Ambulatory Visit: Payer: Medicare Other | Admitting: Gastroenterology

## 2014-05-05 ENCOUNTER — Other Ambulatory Visit: Payer: Self-pay | Admitting: *Deleted

## 2014-05-09 MED ORDER — VENLAFAXINE HCL ER 150 MG PO CP24: 150.0000 mg | ORAL_CAPSULE | Freq: Every day | ORAL | Status: DC

## 2014-06-24 ENCOUNTER — Telehealth: Payer: Self-pay

## 2014-06-24 NOTE — Telephone Encounter (Signed)
Left message for pt to call back concerning need for mammogram screening.

## 2014-07-04 ENCOUNTER — Other Ambulatory Visit: Payer: Self-pay

## 2014-07-04 MED ORDER — LOSARTAN POTASSIUM 100 MG PO TABS: 100.0000 mg | ORAL_TABLET | Freq: Every day | ORAL | Status: DC

## 2014-07-20 ENCOUNTER — Ambulatory Visit (INDEPENDENT_AMBULATORY_CARE_PROVIDER_SITE_OTHER)
Admission: RE | Admit: 2014-07-20 | Discharge: 2014-07-20 | Disposition: A | Discharge status: Home / Self Care | Payer: Medicare Other | Source: Ambulatory Visit | Attending: Family Medicine | Admitting: Family Medicine

## 2014-07-20 ENCOUNTER — Encounter: Payer: Self-pay | Admitting: Family Medicine

## 2014-07-20 ENCOUNTER — Telehealth: Payer: Self-pay | Admitting: Family

## 2014-07-20 ENCOUNTER — Ambulatory Visit (INDEPENDENT_AMBULATORY_CARE_PROVIDER_SITE_OTHER): Payer: Medicare Other | Admitting: Family Medicine

## 2014-07-20 VITALS — BP 126/90 | HR 68 | Temp 98.0°F | Wt 165.0 lb

## 2014-07-20 DIAGNOSIS — R042 Hemoptysis: Secondary | ICD-10-CM | POA: Diagnosis not present

## 2014-07-20 LAB — CBC WITH DIFFERENTIAL/PLATELET
Basophils Absolute: 0 10*3/uL (ref 0.0–0.1)
Basophils Relative: 0.4 % (ref 0.0–3.0)
Eosinophils Absolute: 0.3 10*3/uL (ref 0.0–0.7)
Eosinophils Relative: 2.7 % (ref 0.0–5.0)
HCT: 39.9 % (ref 36.0–46.0)
Hemoglobin: 13.6 g/dL (ref 12.0–15.0)
LYMPHS ABS: 1.9 10*3/uL (ref 0.7–4.0)
LYMPHS PCT: 18.9 % (ref 12.0–46.0)
MCHC: 34 g/dL (ref 30.0–36.0)
MCV: 86.7 fl (ref 78.0–100.0)
MONOS PCT: 7.4 % (ref 3.0–12.0)
Monocytes Absolute: 0.8 10*3/uL (ref 0.1–1.0)
NEUTROS ABS: 7.2 10*3/uL (ref 1.4–7.7)
NEUTROS PCT: 70.6 % (ref 43.0–77.0)
PLATELETS: 247 10*3/uL (ref 150.0–400.0)
RBC: 4.6 Mil/uL (ref 3.87–5.11)
RDW: 13.6 % (ref 11.5–15.5)
WBC: 10.2 10*3/uL (ref 4.0–10.5)

## 2014-07-20 LAB — COMPREHENSIVE METABOLIC PANEL
ALBUMIN: 4.1 g/dL (ref 3.5–5.2)
ALT: 12 U/L (ref 0–35)
AST: 11 U/L (ref 0–37)
Alkaline Phosphatase: 80 U/L (ref 39–117)
BUN: 10 mg/dL (ref 6–23)
CO2: 32 mEq/L (ref 19–32)
CREATININE: 0.71 mg/dL (ref 0.40–1.20)
Calcium: 9.8 mg/dL (ref 8.4–10.5)
Chloride: 100 mEq/L (ref 96–112)
GFR: 89.35 mL/min (ref >60.00)
Glucose, Bld: 107 mg/dL — ABNORMAL HIGH (ref 70–99)
Potassium: 4 mEq/L (ref 3.5–5.1)
Sodium: 136 mEq/L (ref 135–145)
Total Bilirubin: 0.4 mg/dL (ref 0.2–1.2)
Total Protein: 7.3 g/dL (ref 6.0–8.3)

## 2014-07-20 MED ORDER — VENLAFAXINE HCL ER 150 MG PO CP24: 150.0000 mg | ORAL_CAPSULE | Freq: Every day | ORAL | Status: DC

## 2014-07-20 NOTE — Progress Notes (Signed)
Pre visit review using our clinic review tool, if applicable. No additional management support is needed unless otherwise documented below in the visit note. 

## 2014-07-20 NOTE — Progress Notes (Signed)
   Subjective:    Patient ID: Laurie [name], female    DOB: [birth year], 59 y.o.   MRN: [id number]  HPI Patient has a long history of smoking is seen with onset Monday of occasional hemoptysis of bright red blood. She states she was eating and had mild choking episode and had some coughing afterwards and that is when she first noticed the blood. She has had some occasional yellow tinged sputum. Denies any pleuritic pain. No fevers or chills. Denies any lower extremity swelling or calf pain. She's had slightly diminished appetite over recent weeks. Modest weight loss over recent months. No exertional chest pain. Occasional chest soreness with coughing. Some increased malaise.  Chronic problems are reviewed and as below.  Past Medical History  Diagnosis Date  . IBS (irritable bowel syndrome)   . Esophageal stricture   . Chronic back pain   . Depression   . Interstitial cystitis   . Allergy   . Anxiety   . GERD (gastroesophageal reflux disease)   . Heart murmur   . Hyperlipidemia   . Hypertension   . Neuromuscular disorder     fibromyalgia  . Asthma   . Arthritis     RA   Past Surgical History  Procedure Laterality Date  . Abdominal hysterectomy    . Appendectomy    . Tonsillectomy    . Finger surgery    . Cervical laminectomy    . Cervical fusion    . Lumbar fusion    . Esophagogastroduodenoscopy      reports that she has been smoking Cigarettes.  She has never used smokeless tobacco. She reports that she does not drink alcohol or use illicit drugs. family history includes Aneurysm in her brother; Cancer in her brother; Coronary artery disease in her father, paternal aunt, and paternal grandmother; Dementia in her mother; Esophageal cancer in her brother; Heart attack in her father; Stomach cancer in her paternal aunt. There is no history of Rectal cancer or Colon cancer. Allergies  Allergen Reactions  . Codeine     REACTION: Vomiting  . Lisinopril Cough  . Sulfonamide  Derivatives     REACTION: Upset GI      Review of Systems  Constitutional: Positive for appetite change. Negative for fever, chills, fatigue and unexpected weight change.  HENT: Negative for trouble swallowing.   Eyes: Negative for visual disturbance.  Respiratory: Positive for cough and shortness of breath. Negative for chest tightness and wheezing.   Cardiovascular: Negative for chest pain, palpitations and leg swelling.  Neurological: Negative for dizziness, seizures, syncope, weakness, light-headedness and headaches.       Objective:   Physical Exam  Constitutional: She appears well-developed and well-nourished.  Neck: Neck supple. No thyromegaly present.  Cardiovascular: Normal rate and regular rhythm.   Pulmonary/Chest: Effort normal and breath sounds normal. No respiratory distress. She has no wheezes. She has no rales.  Musculoskeletal: She exhibits no edema.  Lymphadenopathy:    She has no cervical adenopathy.  Neurological: She is alert.          Assessment & Plan:  2 day history of hemoptysis in a long-term smoker with modest weight loss.  Obtain CBC, comprehensive metabolic panel, d-dimer, chest x-ray. Clinical suspicion for pulmonary embolus is low. She has normal pulse oximetry, normal pulse, etc. May need chest CT to further clarify

## 2014-07-20 NOTE — Telephone Encounter (Signed)
Pt informed as soon as Dr. B releases results to me i will give her a call.

## 2014-07-20 NOTE — Telephone Encounter (Signed)
Pt would like results of xray   Please call back.

## 2014-07-21 ENCOUNTER — Telehealth: Payer: Self-pay | Admitting: *Deleted

## 2014-07-21 ENCOUNTER — Other Ambulatory Visit: Payer: Self-pay | Admitting: Family Medicine

## 2014-07-21 ENCOUNTER — Ambulatory Visit (INDEPENDENT_AMBULATORY_CARE_PROVIDER_SITE_OTHER)
Admission: RE | Admit: 2014-07-21 | Discharge: 2014-07-21 | Disposition: A | Discharge status: Home / Self Care | Payer: Medicare Other | Source: Ambulatory Visit | Attending: Family Medicine | Admitting: Family Medicine

## 2014-07-21 DIAGNOSIS — J209 Acute bronchitis, unspecified: Secondary | ICD-10-CM

## 2014-07-21 DIAGNOSIS — R072 Precordial pain: Secondary | ICD-10-CM

## 2014-07-21 LAB — D-DIMER, QUANTITATIVE (NOT AT ARMC): D DIMER QUANT: 2.24 ug{FEU}/mL — AB (ref 0.00–0.48)

## 2014-07-21 MED ORDER — IOHEXOL 350 MG/ML SOLN
80.0000 mL | Freq: Once | INTRAVENOUS | Status: AC | PRN
  Administered 2014-07-21: 80 mL via INTRAVENOUS

## 2014-07-21 NOTE — Telephone Encounter (Signed)
Received stat lab of d.dimer 2.24. MD Burchette requested CT angio. Appointment scheduled at 10:30 this morning at [facility]. Called and spoke to patient, patient aware and plans to make appointment.

## 2014-07-22 ENCOUNTER — Telehealth: Payer: Self-pay | Admitting: *Deleted

## 2014-07-22 ENCOUNTER — Telehealth: Payer: Self-pay | Admitting: Family

## 2014-07-22 NOTE — Telephone Encounter (Signed)
Pt saw dr Elease Hashimoto on 6-8 and had ct scan done yesterday and would like results

## 2014-07-22 NOTE — Telephone Encounter (Signed)
-----   Message from [name], MD sent at 07/21/2014 12:55 PM EDT ----- NO pulmonary emboli on CT.  Nothing to explain her hemoptysis.

## 2014-07-22 NOTE — Telephone Encounter (Signed)
I called the pt and advised her of the results and she asked if Dr Laurie [name] would accept her as a new patient?

## 2014-07-22 NOTE — Telephone Encounter (Signed)
Patient informed. 

## 2014-07-24 NOTE — Telephone Encounter (Signed)
Yes- let her know I will be happy to see her and to let us know for any persistent hemoptysis.   Fortunately, she did not show evidence for lung tumor or blood clot by CT

## 2014-07-25 NOTE — Telephone Encounter (Signed)
Left a message for the pt to return my call.  

## 2014-07-25 NOTE — Telephone Encounter (Signed)
Pt aware dr Elease Hashimoto will see her.

## 2014-08-10 ENCOUNTER — Ambulatory Visit: Payer: Medicare Other | Admitting: Adult Health

## 2014-09-05 ENCOUNTER — Other Ambulatory Visit (HOSPITAL_COMMUNITY): Payer: Self-pay | Admitting: Pain Medicine

## 2014-09-05 ENCOUNTER — Ambulatory Visit (HOSPITAL_COMMUNITY)
Admission: RE | Admit: 2014-09-05 | Discharge: 2014-09-05 | Disposition: A | Discharge status: Home / Self Care | Payer: Medicare Other | Source: Ambulatory Visit | Attending: Pain Medicine | Admitting: Pain Medicine

## 2014-09-05 DIAGNOSIS — M545 Low back pain: Secondary | ICD-10-CM

## 2014-09-05 DIAGNOSIS — M47897 Other spondylosis, lumbosacral region: Secondary | ICD-10-CM | POA: Diagnosis not present

## 2014-09-05 DIAGNOSIS — M47896 Other spondylosis, lumbar region: Secondary | ICD-10-CM | POA: Insufficient documentation

## 2014-09-05 DIAGNOSIS — M5136 Other intervertebral disc degeneration, lumbar region: Secondary | ICD-10-CM | POA: Insufficient documentation

## 2014-09-15 ENCOUNTER — Other Ambulatory Visit: Payer: Self-pay | Admitting: Pain Medicine

## 2014-09-15 DIAGNOSIS — M545 Low back pain: Secondary | ICD-10-CM

## 2014-09-19 ENCOUNTER — Telehealth: Payer: Self-pay | Admitting: Family Medicine

## 2014-09-19 MED ORDER — VENLAFAXINE HCL ER 150 MG PO CP24: 150.0000 mg | ORAL_CAPSULE | Freq: Every day | ORAL | Status: DC

## 2014-09-19 NOTE — Telephone Encounter (Signed)
Refill request for Venlafaxine XR 150 mg take 1 po qd and send to [facility].

## 2014-09-19 NOTE — Telephone Encounter (Signed)
RX sent to pharmacy  

## 2014-09-22 ENCOUNTER — Ambulatory Visit
Admission: RE | Admit: 2014-09-22 | Discharge: 2014-09-22 | Disposition: A | Discharge status: Home / Self Care | Payer: Medicare Other | Source: Ambulatory Visit | Attending: Pain Medicine | Admitting: Pain Medicine

## 2014-09-22 ENCOUNTER — Telehealth: Payer: Self-pay | Admitting: Family Medicine

## 2014-09-22 DIAGNOSIS — M545 Low back pain: Secondary | ICD-10-CM

## 2014-09-22 NOTE — Telephone Encounter (Signed)
error 

## 2014-10-03 ENCOUNTER — Other Ambulatory Visit: Payer: Self-pay | Admitting: *Deleted

## 2014-10-03 MED ORDER — LOSARTAN POTASSIUM 100 MG PO TABS: 100.0000 mg | ORAL_TABLET | Freq: Every day | ORAL | Status: DC

## 2014-10-17 ENCOUNTER — Other Ambulatory Visit: Payer: Self-pay | Admitting: Gastroenterology

## 2014-10-19 ENCOUNTER — Ambulatory Visit: Payer: Medicare Other | Admitting: Family Medicine

## 2014-10-27 ENCOUNTER — Telehealth: Payer: Self-pay | Admitting: Family Medicine

## 2014-10-27 MED ORDER — VENLAFAXINE HCL ER 150 MG PO CP24: 150.0000 mg | ORAL_CAPSULE | Freq: Every day | ORAL | Status: DC

## 2014-10-27 MED ORDER — LOSARTAN POTASSIUM 100 MG PO TABS: 100.0000 mg | ORAL_TABLET | Freq: Every day | ORAL | Status: DC

## 2014-10-27 NOTE — Telephone Encounter (Signed)
Pt request refill of the following: venlafaxine XR (EFFEXOR-XR) 150 MG 24 hr capsule , losartan (COZAAR) 100 MG tablet   Phamacy: [facility]

## 2014-10-27 NOTE — Telephone Encounter (Signed)
Pt has rescheduled her appt, but ask if you could pls send in enough med to get her through to appt. Pt states she has been out for two days and does not feel well/ appt sept 27  [facility] [address]

## 2014-10-27 NOTE — Telephone Encounter (Signed)
Rx sent to pharmacy   

## 2014-11-04 ENCOUNTER — Other Ambulatory Visit: Payer: Self-pay | Admitting: Pain Medicine

## 2014-11-04 DIAGNOSIS — M546 Pain in thoracic spine: Secondary | ICD-10-CM

## 2014-11-08 ENCOUNTER — Encounter: Payer: Self-pay | Admitting: Family Medicine

## 2014-11-08 ENCOUNTER — Ambulatory Visit (INDEPENDENT_AMBULATORY_CARE_PROVIDER_SITE_OTHER): Payer: Medicare Other | Admitting: Family Medicine

## 2014-11-08 VITALS — BP 180/88 | HR 70 | Temp 97.9°F | Ht 70.0 in | Wt 162.8 lb

## 2014-11-08 DIAGNOSIS — I2510 Atherosclerotic heart disease of native coronary artery without angina pectoris: Secondary | ICD-10-CM | POA: Diagnosis not present

## 2014-11-08 DIAGNOSIS — I10 Essential (primary) hypertension: Secondary | ICD-10-CM | POA: Diagnosis not present

## 2014-11-08 DIAGNOSIS — E785 Hyperlipidemia, unspecified: Secondary | ICD-10-CM | POA: Diagnosis not present

## 2014-11-08 DIAGNOSIS — Z23 Encounter for immunization: Secondary | ICD-10-CM | POA: Diagnosis not present

## 2014-11-08 MED ORDER — LOSARTAN POTASSIUM-HCTZ 100-12.5 MG PO TABS: 1.0000 | ORAL_TABLET | Freq: Every day | ORAL | Status: DC

## 2014-11-08 MED ORDER — ROSUVASTATIN CALCIUM 20 MG PO TABS: 20.0000 mg | ORAL_TABLET | Freq: Every day | ORAL | Status: DC

## 2014-11-08 NOTE — Progress Notes (Signed)
Pre visit review using our clinic review tool, if applicable. No additional management support is needed unless otherwise documented below in the visit note. 

## 2014-11-08 NOTE — Progress Notes (Signed)
Subjective:    Patient ID: Laurie [name], female    DOB: December 26, 1955, 59 y.o.   MRN: [id number]  HPI Patient seen in transfer of care. She has history of ongoing nicotine use, nonobstructive coronary artery disease, GERD with prior history of esophageal stricture, hypertension, hyperlipidemia. She apparently took a statin-Crestor-briefly from samples and never had problems but is currently not taking anything. She's had very high lipids in the past. She's had some recent increased malaise and diffuse dull headaches. She has been out of her losartan for about one week but states even when she was taking this daily she had somewhat poorly controlled blood pressure. Her sister was diagnosed with coronary disease recently at age 69 but apparently this was a vasospastic type problem.  Patient smokes about 1 pack sig as per day and is contemplating quitting.  Past Medical History  Diagnosis Date  . IBS (irritable bowel syndrome)   . Esophageal stricture   . Chronic back pain   . Depression   . Interstitial cystitis   . Allergy   . Anxiety   . GERD (gastroesophageal reflux disease)   . Heart murmur   . Hyperlipidemia   . Hypertension   . Neuromuscular disorder     fibromyalgia  . Asthma   . Arthritis     RA   Past Surgical History  Procedure Laterality Date  . Abdominal hysterectomy    . Appendectomy    . Tonsillectomy    . Finger surgery    . Cervical laminectomy    . Cervical fusion    . Lumbar fusion    . Esophagogastroduodenoscopy      reports that she has been smoking Cigarettes.  She has never used smokeless tobacco. She reports that she does not drink alcohol or use illicit drugs. family history includes Aneurysm in her brother; Cancer in her brother; Coronary artery disease in her father, paternal aunt, and paternal grandmother; Dementia in her mother; Esophageal cancer in her brother; Heart attack in her father; Stomach cancer in her paternal aunt. There is no history  of Rectal cancer or Colon cancer. Allergies  Allergen Reactions  . Codeine     REACTION: Vomiting  . Lisinopril Cough  . Sulfonamide Derivatives     REACTION: Upset GI      Review of Systems  Constitutional: Positive for fatigue.  Eyes: Negative for visual disturbance.  Respiratory: Negative for cough, chest tightness, shortness of breath and wheezing.   Cardiovascular: Negative for chest pain, palpitations and leg swelling.  Endocrine: Negative for polydipsia and polyuria.  Neurological: Positive for headaches. Negative for dizziness, seizures, syncope, weakness and light-headedness.       Objective:   Physical Exam  Constitutional: She appears well-developed and well-nourished.  Neck: Neck supple. No thyromegaly present.  Cardiovascular: Normal rate and regular rhythm.  Exam reveals no gallop.   Pulmonary/Chest: Effort normal and breath sounds normal. No respiratory distress. She has no wheezes. She has no rales.  Musculoskeletal: She exhibits no edema.  Lymphadenopathy:    She has no cervical adenopathy.          Assessment & Plan:  #1 hypertension very poorly controlled. Repeat blood pressure left and right arm seated 180/88 (off her Losartan for one week). Change losartan to losartan HCTZ 100/12.5 mg 1 daily recheck blood pressure 2 weeks #2 hyperlipidemia. Currently not treated. Start Crestor 20 mg once daily and reassess lipids in about 2 months #3 headaches. Possibly related to #1. #4 ongoing  nicotine use. Patient is encouraged to quit. Informational pamphlet -[employer] quit line given.

## 2014-11-22 ENCOUNTER — Telehealth: Payer: Self-pay | Admitting: *Deleted

## 2014-11-22 ENCOUNTER — Other Ambulatory Visit: Payer: Self-pay | Admitting: Gastroenterology

## 2014-11-22 NOTE — Telephone Encounter (Signed)
Per Alonza Bogus, PA, this former Dr. Deatra Ina patient, will need to choose another MD.  Janett Billow approved sending # 60 with 6 refills for Pantoprazole Sodium 40 mg.

## 2014-11-23 ENCOUNTER — Ambulatory Visit (INDEPENDENT_AMBULATORY_CARE_PROVIDER_SITE_OTHER): Payer: Medicare Other | Admitting: Family Medicine

## 2014-11-23 VITALS — BP 130/70 | HR 88 | Temp 97.7°F | Wt 158.5 lb

## 2014-11-23 DIAGNOSIS — I10 Essential (primary) hypertension: Secondary | ICD-10-CM | POA: Diagnosis not present

## 2014-11-23 DIAGNOSIS — R300 Dysuria: Secondary | ICD-10-CM | POA: Diagnosis not present

## 2014-11-23 LAB — POCT URINALYSIS DIPSTICK
Blood, UA: NEGATIVE
Glucose, UA: NEGATIVE
Ketones, UA: NEGATIVE
NITRITE UA: NEGATIVE
PH UA: 5.5
Spec Grav, UA: >=1.03
Urobilinogen, UA: 0.2

## 2014-11-23 MED ORDER — CIPROFLOXACIN HCL 500 MG PO TABS: 500.0000 mg | ORAL_TABLET | Freq: Two times a day (BID) | ORAL | Status: DC

## 2014-11-23 NOTE — Progress Notes (Signed)
   Subjective:    Patient ID: Laurie [name], female    DOB: 19-Jan-1956, 59 y.o.   MRN: [id number]  HPI  Hypertension. Refer to recent note. Very poorly controlled. She had been out of losartan for approximately 1 week. We increased this to losartan HCTZ and blood pressures much improved today. Headaches resolved. Feels some better overall. Still has some fatigue issues. She has occasional lightheadedness. No syncope  Urinary urgency and mild burning with urination past few days. She does have history of interstitial cystitis. No fevers or chills. History of allergy to sulfa. She has had nausea with Macrobid.  Past Medical History  Diagnosis Date  . IBS (irritable bowel syndrome)   . Esophageal stricture   . Chronic back pain   . Depression   . Interstitial cystitis   . Allergy   . Anxiety   . GERD (gastroesophageal reflux disease)   . Heart murmur   . Hyperlipidemia   . Hypertension   . Neuromuscular disorder     fibromyalgia  . Asthma   . Arthritis     RA   Past Surgical History  Procedure Laterality Date  . Abdominal hysterectomy    . Appendectomy    . Tonsillectomy    . Finger surgery    . Cervical laminectomy    . Cervical fusion    . Lumbar fusion    . Esophagogastroduodenoscopy      reports that she has been smoking Cigarettes.  She has never used smokeless tobacco. She reports that she does not drink alcohol or use illicit drugs. family history includes Aneurysm in her brother; Cancer in her brother; Coronary artery disease in her father, paternal aunt, and paternal grandmother; Dementia in her mother; Esophageal cancer in her brother; Heart attack in her father; Stomach cancer in her paternal aunt. There is no history of Rectal cancer or Colon cancer. Allergies  Allergen Reactions  . Codeine     REACTION: Vomiting  . Lisinopril Cough  . Sulfonamide Derivatives     REACTION: Upset GI      Review of Systems  Constitutional: Positive for fatigue. Negative  for fever, chills and appetite change.  Gastrointestinal: Negative for nausea, vomiting, abdominal pain, diarrhea and constipation.  Genitourinary: Positive for dysuria, urgency and frequency. Negative for hematuria.  Musculoskeletal: Negative for back pain.  Neurological: Positive for light-headedness.       Objective:   Physical Exam  Constitutional: She appears well-developed and well-nourished.  Neck: Neck supple.  Cardiovascular: Normal rate and regular rhythm.  Exam reveals no gallop.   Pulmonary/Chest: Effort normal and breath sounds normal. No respiratory distress. She has no wheezes. She has no rales.          Assessment & Plan:  #1 hypertension. Much improved and at goal. Continue current medication. Routine follow-up 6 months #2 dysuria. Possible UTI. Urine culture sent. Cipro 500 mg twice a day for 3 days pending culture results

## 2014-11-23 NOTE — Progress Notes (Signed)
Pre visit review using our clinic review tool, if applicable. No additional management support is needed unless otherwise documented below in the visit note. 

## 2014-11-23 NOTE — Patient Instructions (Signed)

## 2014-11-26 LAB — URINE CULTURE

## 2014-11-29 ENCOUNTER — Telehealth: Payer: Self-pay | Admitting: Family Medicine

## 2014-11-29 NOTE — Telephone Encounter (Signed)
Spoke to pt and she still have symptoms

## 2014-11-29 NOTE — Telephone Encounter (Signed)
Pt is calling back concerning UA result

## 2014-11-30 ENCOUNTER — Other Ambulatory Visit: Payer: Self-pay | Admitting: Family Medicine

## 2014-11-30 MED ORDER — CIPROFLOXACIN HCL 500 MG PO TABS: 500.0000 mg | ORAL_TABLET | Freq: Two times a day (BID) | ORAL | Status: DC

## 2014-12-25 ENCOUNTER — Ambulatory Visit
Admission: RE | Admit: 2014-12-25 | Discharge: 2014-12-25 | Disposition: A | Discharge status: Home / Self Care | Payer: Medicare Other | Source: Ambulatory Visit | Attending: Pain Medicine | Admitting: Pain Medicine

## 2014-12-25 DIAGNOSIS — M546 Pain in thoracic spine: Secondary | ICD-10-CM

## 2015-02-23 DIAGNOSIS — M546 Pain in thoracic spine: Secondary | ICD-10-CM | POA: Diagnosis not present

## 2015-02-23 DIAGNOSIS — G894 Chronic pain syndrome: Secondary | ICD-10-CM | POA: Diagnosis not present

## 2015-02-23 DIAGNOSIS — Z79899 Other long term (current) drug therapy: Secondary | ICD-10-CM | POA: Diagnosis not present

## 2015-02-23 DIAGNOSIS — I10 Essential (primary) hypertension: Secondary | ICD-10-CM | POA: Diagnosis not present

## 2015-02-23 DIAGNOSIS — M542 Cervicalgia: Secondary | ICD-10-CM | POA: Diagnosis not present

## 2015-02-23 DIAGNOSIS — M5134 Other intervertebral disc degeneration, thoracic region: Secondary | ICD-10-CM | POA: Diagnosis not present

## 2015-02-23 DIAGNOSIS — M47814 Spondylosis without myelopathy or radiculopathy, thoracic region: Secondary | ICD-10-CM | POA: Diagnosis not present

## 2015-03-03 ENCOUNTER — Telehealth: Payer: Self-pay | Admitting: Family Medicine

## 2015-03-03 NOTE — Telephone Encounter (Signed)
Pt has seen dr Ellene Route for neck pain and did not ask doctor for any pain medication. Pt is taking for aleve with little relief. Pt would like pain med call into [facility] [address]. Pt has a rupture disc in upper part of her .back.

## 2015-03-03 NOTE — Telephone Encounter (Signed)
FYI: Pt is aware that she needs to receive pain medication from the doctor that evaluated her. He did refuse to give her medication so that's why she called [place].

## 2015-05-24 ENCOUNTER — Ambulatory Visit: Payer: Self-pay | Admitting: Family Medicine

## 2015-06-21 ENCOUNTER — Ambulatory Visit: Payer: Self-pay | Admitting: Family Medicine

## 2015-06-26 ENCOUNTER — Ambulatory Visit (INDEPENDENT_AMBULATORY_CARE_PROVIDER_SITE_OTHER): Payer: Medicare Other | Admitting: Family Medicine

## 2015-06-26 VITALS — BP 150/90 | HR 90 | Temp 98.7°F | Ht 70.0 in | Wt 163.0 lb

## 2015-06-26 DIAGNOSIS — K2100 Gastro-esophageal reflux disease with esophagitis, without bleeding: Secondary | ICD-10-CM

## 2015-06-26 DIAGNOSIS — R5383 Other fatigue: Secondary | ICD-10-CM

## 2015-06-26 DIAGNOSIS — Z0000 Encounter for general adult medical examination without abnormal findings: Secondary | ICD-10-CM | POA: Diagnosis not present

## 2015-06-26 DIAGNOSIS — E785 Hyperlipidemia, unspecified: Secondary | ICD-10-CM

## 2015-06-26 DIAGNOSIS — I10 Essential (primary) hypertension: Secondary | ICD-10-CM | POA: Diagnosis not present

## 2015-06-26 NOTE — Progress Notes (Signed)
Subjective:    Patient ID: Laurie [name], female    DOB: 03-30-1955, 60 y.o.   MRN: [id number]  HPI   Patient seen for medical follow-up.   Chronic problems include ongoing nicotine use, chronic back pain, GERD, osteoarthritis, interstitial cystitis, hyperlipidemia, history of CAD. She complains of general fatigue over the past several months.   Generally sleeping well. Takes Effexor for depression and feels depression symptoms are relatively stable. No recent chest pains. No exertional dyspnea.  She wonders if any of her medications could be causing fatigue.  She's not had any recent lab work. She would like to schedule complete physical.  Still smokes. No recent cough. She has lost about 20 pounds since last year but states she has good appetite. Colonoscopy up-to-date. Mammogram is overdue. She plans to set up soon  Past Medical History  Diagnosis Date  . IBS (irritable bowel syndrome)   . Esophageal stricture   . Chronic back pain   . Depression   . Interstitial cystitis   . Allergy   . Anxiety   . GERD (gastroesophageal reflux disease)   . Heart murmur   . Hyperlipidemia   . Hypertension   . Neuromuscular disorder     fibromyalgia  . Asthma   . Arthritis     RA   Past Surgical History  Procedure Laterality Date  . Abdominal hysterectomy    . Appendectomy    . Tonsillectomy    . Finger surgery    . Cervical laminectomy    . Cervical fusion    . Lumbar fusion    . Esophagogastroduodenoscopy      reports that she has been smoking Cigarettes.  She has never used smokeless tobacco. She reports that she does not drink alcohol or use illicit drugs. family history includes Aneurysm in her brother; Cancer in her brother; Coronary artery disease in her father, paternal aunt, and paternal grandmother; Dementia in her mother; Esophageal cancer in her brother; Heart attack in her father; Stomach cancer in her paternal aunt. There is no history of Rectal cancer or Colon  cancer. Allergies  Allergen Reactions  . Codeine     REACTION: Vomiting  . Lisinopril Cough  . Sulfonamide Derivatives     REACTION: Upset GI      Review of Systems  Constitutional: Positive for fatigue and unexpected weight change. Negative for fever, chills and appetite change.  HENT: Negative for trouble swallowing.   Respiratory: Negative for cough and shortness of breath.   Cardiovascular: Negative for chest pain, palpitations and leg swelling.  Gastrointestinal: Negative for nausea, vomiting, abdominal pain and diarrhea.  Endocrine: Negative for polydipsia and polyuria.  Genitourinary: Negative for dysuria and hematuria.  Musculoskeletal: Positive for back pain ( chronic).  Neurological: Negative for syncope.  Hematological: Negative for adenopathy.       Objective:   Physical Exam  Constitutional: She appears well-developed and well-nourished.  HENT:  Mouth/Throat: Oropharynx is clear and moist.  Neck: Neck supple. No thyromegaly present.  Cardiovascular: Normal rate and regular rhythm.  Exam reveals no gallop.   Pulmonary/Chest: Effort normal and breath sounds normal. No respiratory distress. She has no wheezes. She has no rales.  Abdominal: Soft. There is no tenderness.  Musculoskeletal: She exhibits no edema.  Lymphadenopathy:    She has no cervical adenopathy.  Psychiatric: She has a normal mood and affect. Her behavior is normal. Judgment and thought content normal.          Assessment &  Plan:   #1 hypertension. Slightly up-to-date. Recommend close her home monitoring. Continue current medications.   #2 dyslipidemia. Needs follow-up labs. Will schedule physical and get fasting lipid panel   #3 fatigue. Very nonspecific symptoms. Needs lab work. Will add B12 with chronic PPI use   #4 GERD stable on pantoprazole.   #5 health maintenance. Needs follow-up mammogram. Colonoscopy up-to-date.  [name] MD [facility]

## 2015-06-26 NOTE — Patient Instructions (Signed)
Set up physical.

## 2015-06-26 NOTE — Progress Notes (Signed)
Pre visit review using our clinic review tool, if applicable. No additional management support is needed unless otherwise documented below in the visit note. 

## 2015-07-19 ENCOUNTER — Other Ambulatory Visit: Payer: Self-pay

## 2015-07-24 ENCOUNTER — Encounter: Payer: Self-pay | Admitting: Family Medicine

## 2015-08-16 ENCOUNTER — Telehealth: Payer: Self-pay | Admitting: Family Medicine

## 2015-08-16 DIAGNOSIS — N63 Unspecified lump in unspecified breast: Secondary | ICD-10-CM

## 2015-08-16 NOTE — Telephone Encounter (Signed)
She needs diagnostic mammogram with ultrasound (if indicated).  Offer patient follow up here to assess.  We normally see patients before sending for diagnostic tests but do not want to delay assessment.

## 2015-08-16 NOTE — Telephone Encounter (Signed)
Pt needs an order to have diagnotic mammogram due to knot in breast. Please send to breast center

## 2015-08-17 NOTE — Telephone Encounter (Signed)
Referral put in and patient notified.

## 2015-08-17 NOTE — Telephone Encounter (Signed)
Left message for patient to call us back. Need to know which breast the lump is in so can put in referral.

## 2015-08-18 ENCOUNTER — Other Ambulatory Visit (INDEPENDENT_AMBULATORY_CARE_PROVIDER_SITE_OTHER): Payer: Medicare Other

## 2015-08-18 DIAGNOSIS — Z0000 Encounter for general adult medical examination without abnormal findings: Secondary | ICD-10-CM | POA: Diagnosis not present

## 2015-08-18 DIAGNOSIS — R7989 Other specified abnormal findings of blood chemistry: Secondary | ICD-10-CM

## 2015-08-18 LAB — HEPATIC FUNCTION PANEL
ALT: 10 U/L (ref 0–35)
AST: 10 U/L (ref 0–37)
Albumin: 4 g/dL (ref 3.5–5.2)
Alkaline Phosphatase: 70 U/L (ref 39–117)
BILIRUBIN DIRECT: 0 mg/dL (ref 0.0–0.3)
BILIRUBIN TOTAL: 0.3 mg/dL (ref 0.2–1.2)
TOTAL PROTEIN: 6.7 g/dL (ref 6.0–8.3)

## 2015-08-18 LAB — BASIC METABOLIC PANEL
BUN: 17 mg/dL (ref 6–23)
CHLORIDE: 100 meq/L (ref 96–112)
CO2: 30 mEq/L (ref 19–32)
Calcium: 9.9 mg/dL (ref 8.4–10.5)
Creatinine, Ser: 1.04 mg/dL (ref 0.40–1.20)
GFR: 57.31 mL/min — ABNORMAL LOW (ref >60.00)
Glucose, Bld: 108 mg/dL — ABNORMAL HIGH (ref 70–99)
POTASSIUM: 3.9 meq/L (ref 3.5–5.1)
SODIUM: 138 meq/L (ref 135–145)

## 2015-08-18 LAB — LIPID PANEL
CHOL/HDL RATIO: 7
CHOLESTEROL: 253 mg/dL — AB (ref 0–200)
HDL: 34.4 mg/dL — AB (ref >39.00)
NonHDL: 219.09
TRIGLYCERIDES: 334 mg/dL — AB (ref 0.0–149.0)
VLDL: 66.8 mg/dL — AB (ref 0.0–40.0)

## 2015-08-18 LAB — LDL CHOLESTEROL, DIRECT: Direct LDL: 180 mg/dL

## 2015-08-18 LAB — VITAMIN B12: VITAMIN B 12: 423 pg/mL (ref 211–911)

## 2015-08-18 LAB — CBC WITH DIFFERENTIAL/PLATELET
BASOS ABS: 0.1 10*3/uL (ref 0.0–0.1)
BASOS PCT: 0.7 % (ref 0.0–3.0)
EOS ABS: 0.7 10*3/uL (ref 0.0–0.7)
Eosinophils Relative: 6.2 % — ABNORMAL HIGH (ref 0.0–5.0)
HCT: 39.1 % (ref 36.0–46.0)
Hemoglobin: 13.4 g/dL (ref 12.0–15.0)
LYMPHS ABS: 3.7 10*3/uL (ref 0.7–4.0)
Lymphocytes Relative: 33.6 % (ref 12.0–46.0)
MCHC: 34.1 g/dL (ref 30.0–36.0)
MCV: 87.2 fl (ref 78.0–100.0)
MONO ABS: 0.7 10*3/uL (ref 0.1–1.0)
Monocytes Relative: 6 % (ref 3.0–12.0)
NEUTROS ABS: 5.9 10*3/uL (ref 1.4–7.7)
NEUTROS PCT: 53.5 % (ref 43.0–77.0)
PLATELETS: 326 10*3/uL (ref 150.0–400.0)
RBC: 4.49 Mil/uL (ref 3.87–5.11)
RDW: 13.7 % (ref 11.5–15.5)
WBC: 11.1 10*3/uL — ABNORMAL HIGH (ref 4.0–10.5)

## 2015-08-18 LAB — VITAMIN D 25 HYDROXY (VIT D DEFICIENCY, FRACTURES): VITD: 16.55 ng/mL — ABNORMAL LOW (ref 30.00–100.00)

## 2015-08-18 LAB — TSH: TSH: 2.67 u[IU]/mL (ref 0.35–4.50)

## 2015-08-21 ENCOUNTER — Telehealth: Payer: Self-pay | Admitting: Family Medicine

## 2015-08-21 DIAGNOSIS — N6320 Unspecified lump in the left breast, unspecified quadrant: Secondary | ICD-10-CM

## 2015-08-21 NOTE — Telephone Encounter (Signed)
-----   Message from [employer] sent at 08/18/2015  1:47 PM EDT ----- Regarding: Mammogram Order  Please change the mammogram in Epic to be TOMO. The code for that is [id number]). An ultrasound of the left breast would need to be added as well. The code for that is [id number]). Thank you in advance, Dr. Elease Hashimoto, for your time and assistance with this.

## 2015-08-23 ENCOUNTER — Ambulatory Visit (INDEPENDENT_AMBULATORY_CARE_PROVIDER_SITE_OTHER): Payer: Medicare Other | Admitting: Family Medicine

## 2015-08-23 ENCOUNTER — Encounter: Payer: Self-pay | Admitting: Family Medicine

## 2015-08-23 VITALS — BP 122/74 | HR 104 | Temp 98.3°F | Ht 70.0 in | Wt 161.1 lb

## 2015-08-23 DIAGNOSIS — L989 Disorder of the skin and subcutaneous tissue, unspecified: Secondary | ICD-10-CM | POA: Diagnosis not present

## 2015-08-23 DIAGNOSIS — E785 Hyperlipidemia, unspecified: Secondary | ICD-10-CM | POA: Diagnosis not present

## 2015-08-23 DIAGNOSIS — N611 Abscess of the breast and nipple: Secondary | ICD-10-CM | POA: Diagnosis not present

## 2015-08-23 DIAGNOSIS — F17200 Nicotine dependence, unspecified, uncomplicated: Secondary | ICD-10-CM

## 2015-08-23 DIAGNOSIS — Z0000 Encounter for general adult medical examination without abnormal findings: Secondary | ICD-10-CM

## 2015-08-23 DIAGNOSIS — R7989 Other specified abnormal findings of blood chemistry: Secondary | ICD-10-CM | POA: Diagnosis not present

## 2015-08-23 MED ORDER — CHOLECALCIFEROL 1.25 MG (50000 UT) PO TABS: 1.0000 | ORAL_TABLET | ORAL | Status: DC

## 2015-08-23 NOTE — Progress Notes (Signed)
Subjective:    Patient ID: Laurie [name], female    DOB: 10-22-1955, 60 y.o.   MRN: [id number]  HPI Patient seen for physical exam and other issues as below  Health maintenance: -Tetanus 2011 -Colonoscopy 2015 -Previous hysterectomy so no indication for ongoing Pap smears -No mammogram since 2011. -No history of shingles vaccine.  Chronic problems include ongoing nicotine use, osteoarthritis, interstitial cystitis, IBS, hyperlipidemia, history of depression, post menopause, CAD She has not been taking her Crestor regularly.  She has a couple of new items (separate from her physical) that we addressed as below  Nonhealing skin lesion right side of nose which has been present for several years. She states previous physician treated with liquid nitrogen several times but this never resolved. She does frequently agitate and pick at the area. Never been biopsied. No personal history of skin cancer.  Patient has history of subcutaneous cystic swelling along the inferior medial aspect of the left breast. Over the past couple weeks this became tender and slightly more swollen and erythematous. No fevers or chills. No drainage.  Generally has a lot of fatigue. Frequent muscle aches.  Recent labs for CPE revealed very low Vit D levels.  Not checked previously.  Past Medical History  Diagnosis Date  . IBS (irritable bowel syndrome)   . Esophageal stricture   . Chronic back pain   . Depression   . Interstitial cystitis   . Allergy   . Anxiety   . GERD (gastroesophageal reflux disease)   . Heart murmur   . Hyperlipidemia   . Hypertension   . Neuromuscular disorder (HCC)     fibromyalgia  . Asthma   . Arthritis     RA   Past Surgical History  Procedure Laterality Date  . Abdominal hysterectomy    . Appendectomy    . Tonsillectomy    . Finger surgery    . Cervical laminectomy    . Cervical fusion    . Lumbar fusion    . Esophagogastroduodenoscopy      reports that she  has been smoking Cigarettes.  She has been smoking about 0.50 packs per day. She has never used smokeless tobacco. She reports that she does not drink alcohol or use illicit drugs. family history includes Aneurysm in her brother; Cancer in her brother; Coronary artery disease in her father, paternal aunt, and paternal grandmother; Dementia in her mother; Esophageal cancer in her brother; Heart attack in her father; Stomach cancer in her paternal aunt. There is no history of Rectal cancer or Colon cancer. Allergies  Allergen Reactions  . Codeine     REACTION: Vomiting  . Lisinopril Cough  . Sulfonamide Derivatives     REACTION: Upset GI      Review of Systems  Constitutional: Positive for fatigue. Negative for fever, activity change, appetite change and unexpected weight change.  HENT: Negative for ear pain, hearing loss, sore throat and trouble swallowing.   Eyes: Negative for visual disturbance.  Respiratory: Negative for cough and shortness of breath.   Cardiovascular: Negative for chest pain and palpitations.  Gastrointestinal: Negative for abdominal pain, diarrhea, constipation and blood in stool.  Endocrine: Negative for polydipsia and polyuria.  Genitourinary: Negative for dysuria and hematuria.  Musculoskeletal: Positive for myalgias and arthralgias. Negative for back pain.  Skin: Negative for rash.  Neurological: Negative for dizziness, syncope and headaches.  Hematological: Negative for adenopathy.  Psychiatric/Behavioral: Negative for confusion and dysphoric mood.       Objective:  Physical Exam  Constitutional: She appears well-developed and well-nourished. No distress.  HENT:  Right Ear: External ear normal.  Left Ear: External ear normal.  Mouth/Throat: Oropharynx is clear and moist.  Neck: Neck supple. No thyromegaly present.  Cardiovascular: Normal rate and regular rhythm.   Pulmonary/Chest: Effort normal and breath sounds normal. No respiratory distress. She  has no wheezes. She has no rales.  Abdominal: Soft. Bowel sounds are normal. She exhibits no distension and no mass. There is no tenderness. There is no rebound and no guarding.  Musculoskeletal: She exhibits no edema.  Lymphadenopathy:    She has no cervical adenopathy.  Skin:  Patient has fluctuant area of erythema which is tender about one and 1/2 cm diameter along the inferior medial aspect of left breast.  Patient has small ulcerative lesion right side of the nose which is slightly scabbed.  Psychiatric: She has a normal mood and affect. Her behavior is normal. Judgment and thought content normal.  PH Q-9 score of 9          Assessment & Plan:  #1 physical exam. Multiple health maintenance issues addressed. -Set up repeat mammogram. -Tetanus up-to-date. -Colonoscopy up-to-date. -Discussed sun protection. -Check on coverage for shingles vaccine and recommended this as covered. -Continue with yearly flu vaccination  #2 nonhealing skin lesion right side of nose. Present for several years. Needs further evaluation. Rule out squamous cell cancer.  Set up with [facility] surgery Center to discuss whether they think this should be biopsied.  Referral made  #3 dyslipidemia. Lipids currently poorly controlled. Get back on regular use of Crestor-especially with her history of CAD  #4 very low vitamin D level. Start vitamin D 50,000 international units once weekly and repeat vitamin D level in 3 months  #5 abscess left medial inferior breast. We discussed risk and benefits of incision and drainage and patient consented. Skin prepped with Betadine. Anesthesia with 1% plain Xylocaine. Made a small linear incision with #11 blade. Expressed copious pus and also contents of sebaceous cyst sac along with much of the wall.  Used curved hemostats to separate and open up some deeper loculations.  Packing with quarter inch iodoform gauze. Outer dressing and topical antibiotic applied. Return in 2 days for  packing removal  #6 nicotine use. Strongly advise cessation  [name] MD  Primary Care at [facility]

## 2015-08-23 NOTE — Progress Notes (Signed)
Pre visit review using our clinic review tool, if applicable. No additional management support is needed unless otherwise documented below in the visit note. 

## 2015-08-23 NOTE — Patient Instructions (Addendum)
Abscess An abscess is an infected area that contains a collection of pus and debris.It can occur in almost any part of the body. An abscess is also known as a furuncle or boil. CAUSES  An abscess occurs when tissue gets infected. This can occur from blockage of oil or sweat glands, infection of hair follicles, or a minor injury to the skin. As the body tries to fight the infection, pus collects in the area and creates pressure under the skin. This pressure causes pain. People with weakened immune systems have difficulty fighting infections and get certain abscesses more often.  SYMPTOMS Usually an abscess develops on the skin and becomes a painful mass that is red, warm, and tender. If the abscess forms under the skin, you may feel a moveable soft area under the skin. Some abscesses break open (rupture) on their own, but most will continue to get worse without care. The infection can spread deeper into the body and eventually into the bloodstream, causing you to feel ill.  DIAGNOSIS  Your caregiver will take your medical history and perform a physical exam. A sample of fluid may also be taken from the abscess to determine what is causing your infection. TREATMENT  Your caregiver may prescribe antibiotic medicines to fight the infection. However, taking antibiotics alone usually does not cure an abscess. Your caregiver may need to make a small cut (incision) in the abscess to drain the pus. In some cases, gauze is packed into the abscess to reduce pain and to continue draining the area. HOME CARE INSTRUCTIONS   Only take over-the-counter or prescription medicines for pain, discomfort, or fever as directed by your caregiver.  If you were prescribed antibiotics, take them as directed. Finish them even if you start to feel better.  If gauze is used, follow your caregiver's directions for changing the gauze.  To avoid spreading the infection:  Keep your draining abscess covered with a  bandage.  Wash your hands well.  Do not share personal care items, towels, or whirlpools with others.  Avoid skin contact with others.  Keep your skin and clothes clean around the abscess.  Keep all follow-up appointments as directed by your caregiver. SEEK MEDICAL CARE IF:   You have increased pain, swelling, redness, fluid drainage, or bleeding.  You have muscle aches, chills, or a general ill feeling.  You have a fever. MAKE SURE YOU:   Understand these instructions.  Will watch your condition.  Will get help right away if you are not doing well or get worse.   This information is not intended to replace advice given to you by your health care provider. Make sure you discuss any questions you have with your health care provider.   Document Released: 11/07/2004 Document Revised: 07/30/2011 Document Reviewed: 04/12/2011 Elsevier Interactive Patient Education 2016 [place] back on your Crestor We are setting up derm referral Check on coverage for shingles vaccine Return in 2 days for packing removal from abscess Try to keep wound relatively dry between now and then

## 2015-08-25 ENCOUNTER — Ambulatory Visit (INDEPENDENT_AMBULATORY_CARE_PROVIDER_SITE_OTHER): Payer: Medicare Other | Admitting: Adult Health

## 2015-08-25 ENCOUNTER — Encounter: Payer: Self-pay | Admitting: Adult Health

## 2015-08-25 VITALS — BP 106/82 | Temp 98.6°F | Ht 70.0 in | Wt 162.0 lb

## 2015-08-25 DIAGNOSIS — Z5189 Encounter for other specified aftercare: Secondary | ICD-10-CM

## 2015-08-25 MED ORDER — CEPHALEXIN 500 MG PO CAPS: 500.0000 mg | ORAL_CAPSULE | Freq: Two times a day (BID) | ORAL | Status: DC

## 2015-08-25 NOTE — Progress Notes (Signed)
Subjective:    Patient ID: Laurie [name], female    DOB: [birth year], 60 y.o.   MRN: [id number]  HPI  60 year old female who is at clinic today to have packing removed from an abscess she had removed from left medial inferior breast. She endorses some additional drainage over the last two days. Also reports " not feeling well."     Review of Systems  Constitutional: Positive for activity change.  Skin: Positive for color change and rash.   Past Medical History  Diagnosis Date  . IBS (irritable bowel syndrome)   . Esophageal stricture   . Chronic back pain   . Depression   . Interstitial cystitis   . Allergy   . Anxiety   . GERD (gastroesophageal reflux disease)   . Heart murmur   . Hyperlipidemia   . Hypertension   . Neuromuscular disorder (HCC)     fibromyalgia  . Asthma   . Arthritis     RA    Social History   Social History  . Marital Status: Single    Spouse Name: N/A  . Number of Children: N/A  . Years of Education: N/A   Occupational History  . Not on file.   Social History Main Topics  . Smoking status: Current Every Day Smoker -- 0.50 packs/day    Types: Cigarettes  . Smokeless tobacco: Never Used     Comment: trying to quit   . Alcohol Use: No  . Drug Use: No  . Sexual Activity: Not on file   Other Topics Concern  . Not on file   Social History Narrative    Past Surgical History  Procedure Laterality Date  . Abdominal hysterectomy    . Appendectomy    . Tonsillectomy    . Finger surgery    . Cervical laminectomy    . Cervical fusion    . Lumbar fusion    . Esophagogastroduodenoscopy      Family History  Problem Relation Age of Onset  . Dementia Mother   . Heart attack Father   . Coronary artery disease Father   . Cancer Brother     esophageal  . Esophageal cancer Brother   . Coronary artery disease Paternal Aunt   . Stomach cancer Paternal Aunt   . Coronary artery disease Paternal Grandmother   . Aneurysm Brother    aortic  . Rectal cancer Neg Hx   . Colon cancer Neg Hx     Allergies  Allergen Reactions  . Codeine     REACTION: Vomiting  . Lisinopril Cough  . Sulfonamide Derivatives     REACTION: Upset GI    Current Outpatient Prescriptions on File Prior to Visit  Medication Sig Dispense Refill  . Cholecalciferol (DIALYVITE VITAMIN D3 MAX) 09811 units TABS Take 1 tablet by mouth once a week. 12 tablet 3  . losartan-hydrochlorothiazide (HYZAAR) 100-12.5 MG per tablet Take 1 tablet by mouth daily. 30 tablet 11  . pantoprazole (PROTONIX) 40 MG tablet Take 1 tablet (40 mg total) by mouth 2 (two) times daily. Take 1 tablet 2 times daily 60 tablet 6  . rosuvastatin (CRESTOR) 20 MG tablet Take 1 tablet (20 mg total) by mouth daily. 30 tablet 11  . venlafaxine XR (EFFEXOR-XR) 150 MG 24 hr capsule Take 1 capsule (150 mg total) by mouth daily. 90 capsule 2   No current facility-administered medications on file prior to visit.    BP 106/82 mmHg  Temp(Src) 98.6 F (  37 C) (Oral)  Ht 5\' 10"  (1.778 m)  Wt 162 lb (73.483 kg)  BMI 23.24 kg/m2       Objective:   Physical Exam  Constitutional: She is oriented to person, place, and time. She appears well-developed and well-nourished. No distress.  Neurological: She is alert and oriented to person, place, and time.  Skin: Skin is warm and dry. No rash noted. She is not diaphoretic. There is erythema. No pallor.  Localized erythema around side. Slight redness. No pus in wound  Psychiatric: She has a normal mood and affect. Her behavior is normal. Judgment and thought content normal.  Vitals reviewed.      Assessment & Plan:  1. Wound check, abscess - Packing material removed in full.  - No additional left inside wound - Will cover with Keflex - cephALEXin (KEFLEX) 500 MG capsule; Take 1 capsule (500 mg total) by mouth 2 (two) times daily.  Dispense: 10 capsule; Refill: 0 - Covered with bandage and antibiotic ointment advised to do the same at  home - Follow up as needed  [name], NP

## 2015-08-25 NOTE — Patient Instructions (Signed)
It was great meeting you today  Continue to keep that area covered with neosporin and a bandage.   I have sent in a prescription for Keflex, take this twice a day for 5 days.   Follow up if needed with Dr. Elease Hashimoto

## 2015-08-26 ENCOUNTER — Other Ambulatory Visit: Payer: Self-pay | Admitting: Adult Health

## 2015-09-06 ENCOUNTER — Telehealth: Payer: Self-pay | Admitting: Family Medicine

## 2015-09-06 MED ORDER — HYDROCODONE-ACETAMINOPHEN 5-325 MG PO TABS: 1.0000 | ORAL_TABLET | Freq: Four times a day (QID) | ORAL | 0 refills | Status: DC | PRN

## 2015-09-06 NOTE — Telephone Encounter (Signed)
Please review. Pt was last seen on 08/25/2015.

## 2015-09-06 NOTE — Telephone Encounter (Signed)
Pt called to let you know that she can not take tramdol due to her anti-depressant medication.

## 2015-09-06 NOTE — Telephone Encounter (Addendum)
Pt states she is having severe back pain now, and can not get out of the bed without help. Pt would like to know if you would write rx for pain med. Pt has been trying not to take any pain meds for a long, but pain is severe right now. Pt has degenerative disc disease, and ruptured disc which seem to be causing more additional severe  pt more pain at this time.

## 2015-09-06 NOTE — Telephone Encounter (Signed)
Pt is aware of annotations below. I have printed and placed script up front for pick up. She is aware and agreeable.

## 2015-09-06 NOTE — Telephone Encounter (Signed)
Would try to avoid long-term use of opioids if possible She cannot take nonsteroidals with history of esophagitis Let's try short-term use of Vicodin 5/325 mg 1-2 every 6 hours for severe pain #40 with no refill

## 2015-09-14 ENCOUNTER — Ambulatory Visit (INDEPENDENT_AMBULATORY_CARE_PROVIDER_SITE_OTHER): Payer: Medicare Other | Admitting: Family Medicine

## 2015-09-14 ENCOUNTER — Encounter: Payer: Self-pay | Admitting: Family Medicine

## 2015-09-14 DIAGNOSIS — R300 Dysuria: Secondary | ICD-10-CM

## 2015-09-14 DIAGNOSIS — N3090 Cystitis, unspecified without hematuria: Secondary | ICD-10-CM

## 2015-09-14 LAB — POCT URINALYSIS DIPSTICK
Nitrite, UA: POSITIVE
Urobilinogen, UA: 2
pH, UA: 5

## 2015-09-14 MED ORDER — AMOXICILLIN-POT CLAVULANATE 875-125 MG PO TABS: 1.0000 | ORAL_TABLET | Freq: Two times a day (BID) | ORAL | 0 refills | Status: DC

## 2015-09-14 NOTE — Progress Notes (Signed)
Subjective:    Patient ID: Laurie [name], female    DOB: [birth year], 60 y.o.   MRN: [id number]  HPI  Laurie [name] is a 60 year old female who presents today for urinary burning for one day.  Associated symptom of urinary frequency, urgency, and suprapubic pain are present.  She denies fever, chills, sweats, hematuria,or flank pain.  She reports some nausea without vomiting that occurs with  discomfort during urination.She has a history of interstitial cystitis and reports urology history with a report of a "small bladder" and various treatments for symptom relief including bladder instillations.  Recent antibiotic use of keflex related to an abscess on her left breast which has now resolved. Treatment at home with AZO which has provided limited benefit.  She reports drinking "very little water"   Review of Systems  Constitutional: Positive for fatigue. Negative for chills and fever.  Respiratory: Negative for cough.   Cardiovascular: Negative for chest pain, palpitations and leg swelling.  Gastrointestinal: Negative for abdominal pain, constipation, diarrhea, nausea and vomiting.  Genitourinary: Positive for dysuria, frequency and urgency. Negative for flank pain, hematuria and vaginal discharge.  Musculoskeletal: Negative for myalgias.  Skin: Negative for rash.  Neurological: Negative for dizziness, light-headedness and headaches.   Past Medical History:  Diagnosis Date  . Allergy   . Anxiety   . Arthritis    RA  . Asthma   . Chronic back pain   . Depression   . Esophageal stricture   . GERD (gastroesophageal reflux disease)   . Heart murmur   . Hyperlipidemia   . Hypertension   . IBS (irritable bowel syndrome)   . Interstitial cystitis   . Neuromuscular disorder ([place])    fibromyalgia     Social History   Social History  . Marital status: Single    Spouse name: N/A  . Number of children: N/A  . Years of education: N/A   Occupational History  . Not on file.    Social History Main Topics  . Smoking status: Current Every Day Smoker    Packs/day: 0.50    Types: Cigarettes  . Smokeless tobacco: Never Used     Comment: trying to quit   . Alcohol use No  . Drug use: No  . Sexual activity: Not on file   Other Topics Concern  . Not on file   Social History Narrative  . No narrative on file    Past Surgical History:  Procedure Laterality Date  . ABDOMINAL HYSTERECTOMY    . APPENDECTOMY    . CERVICAL FUSION    . CERVICAL LAMINECTOMY    . ESOPHAGOGASTRODUODENOSCOPY    . FINGER SURGERY    . LUMBAR FUSION    . TONSILLECTOMY      Family History  Problem Relation Age of Onset  . Dementia Mother   . Heart attack Father   . Coronary artery disease Father   . Cancer Brother     esophageal  . Esophageal cancer Brother   . Coronary artery disease Paternal Aunt   . Stomach cancer Paternal Aunt   . Coronary artery disease Paternal Grandmother   . Aneurysm Brother     aortic  . Rectal cancer Neg Hx   . Colon cancer Neg Hx     Allergies  Allergen Reactions  . Codeine     REACTION: Vomiting  . Lisinopril Cough  . Sulfonamide Derivatives     REACTION: Upset GI    Current Outpatient Prescriptions on  File Prior to Visit  Medication Sig Dispense Refill  . Cholecalciferol (DIALYVITE VITAMIN D3 MAX) 44034 units TABS Take 1 tablet by mouth once a week. 12 tablet 3  . HYDROcodone-acetaminophen (NORCO/VICODIN) 5-325 MG tablet Take 1 tablet by mouth every 6 (six) hours as needed for moderate pain. 40 tablet 0  . losartan-hydrochlorothiazide (HYZAAR) 100-12.5 MG per tablet Take 1 tablet by mouth daily. 30 tablet 11  . pantoprazole (PROTONIX) 40 MG tablet Take 1 tablet (40 mg total) by mouth 2 (two) times daily. Take 1 tablet 2 times daily 60 tablet 6  . rosuvastatin (CRESTOR) 20 MG tablet Take 1 tablet (20 mg total) by mouth daily. 30 tablet 11  . venlafaxine XR (EFFEXOR-XR) 150 MG 24 hr capsule Take 1 capsule (150 mg total) by mouth daily.  90 capsule 2   No current facility-administered medications on file prior to visit.     BP 130/68 (BP Location: Right Arm, Patient Position: Sitting, Cuff Size: Normal)   Pulse 81   Temp 98.3 F (36.8 C) (Oral)   Ht 5\' 10"  (1.778 m)   Wt 156 lb 12.8 oz (71.1 kg)   SpO2 96%   BMI 22.50 kg/m       Objective:   Physical Exam  Constitutional: She is oriented to person, place, and time. She appears well-developed and well-nourished.  Eyes: Pupils are equal, round, and reactive to light. No scleral icterus.  Cardiovascular: Normal rate and regular rhythm.   Pulmonary/Chest: Effort normal and breath sounds normal. She has no wheezes. She has no rales.  Abdominal: Soft. Bowel sounds are normal. There is no CVA tenderness.  Suprapubic tenderness present  Neurological: She is alert and oriented to person, place, and time.  Skin: Skin is warm and dry. No rash noted.       Assessment & Plan:  1. Cystitis UA +Leukocytes, nitrites, and blood. Exam and history support treatment for cystitis. Low suspicion for pyelonephritis with negative CVA tenderness, lack of fever or chills, prior history of UTIs, and interstitial cystitis.  She is + for protein in her urine and she currently is followed by urology.  Will obtain urine culture and contact patient with results due to history of UTIs and interstitial cystitis. - amoxicillin-clavulanate (AUGMENTIN) 875-125 MG tablet; Take 1 tablet by mouth 2 (two) times daily.  Dispense: 14 tablet; Refill: 0  2. Dysuria Advised patient to complete antibiotic and she should follow up if her symptoms do not improve in 2 to 3 days, worsen, she develops a fever >101, or back pain. Also advised her to force fluids and she can use OTC Azo for symptom relief temporarily if needed. Further advised patient to increase her water intake. Patient voiced understanding and agreed with plan.  - POCT urinalysis dipstick - Urine culture  Advised patient to follow up with  her PCP and schedule her follow up with her urologist as recommended. Patient voiced understanding and agreed with plan.   Delano Metz, FNP-C

## 2015-09-14 NOTE — Patient Instructions (Signed)
Please complete antibiotic as directed. If you develop symptoms of fever >101, pain in your back, or do not improve with treatment that has been provided in 3 to 4 days, please follow up for further evaluation and treatment.  Please remember to drink plenty of water!     Urinary Tract Infection Urinary tract infections (UTIs) can develop anywhere along your urinary tract. Your urinary tract is your body's drainage system for removing wastes and extra water. Your urinary tract includes two kidneys, two ureters, a bladder, and a urethra. Your kidneys are a pair of bean-shaped organs. Each kidney is about the size of your fist. They are located below your ribs, one on each side of your spine. CAUSES Infections are caused by microbes, which are microscopic organisms, including fungi, viruses, and bacteria. These organisms are so small that they can only be seen through a microscope. Bacteria are the microbes that most commonly cause UTIs. SYMPTOMS  Symptoms of UTIs may vary by age and gender of the patient and by the location of the infection. Symptoms in young women typically include a frequent and intense urge to urinate and a painful, burning feeling in the bladder or urethra during urination. Older women and men are more likely to be tired, shaky, and weak and have muscle aches and abdominal pain. A fever may mean the infection is in your kidneys. Other symptoms of a kidney infection include pain in your back or sides below the ribs, nausea, and vomiting. DIAGNOSIS To diagnose a UTI, your caregiver will ask you about your symptoms. Your caregiver will also ask you to provide a urine sample. The urine sample will be tested for bacteria and white blood cells. White blood cells are made by your body to help fight infection. TREATMENT  Typically, UTIs can be treated with medication. Because most UTIs are caused by a bacterial infection, they usually can be treated with the use of antibiotics. The choice of  antibiotic and length of treatment depend on your symptoms and the type of bacteria causing your infection. HOME CARE INSTRUCTIONS  If you were prescribed antibiotics, take them exactly as your caregiver instructs you. Finish the medication even if you feel better after you have only taken some of the medication.  Drink enough water and fluids to keep your urine clear or pale yellow.  Avoid caffeine, tea, and carbonated beverages. They tend to irritate your bladder.  Empty your bladder often. Avoid holding urine for long periods of time.  Empty your bladder before and after sexual intercourse.  After a bowel movement, women should cleanse from front to back. Use each tissue only once. SEEK MEDICAL CARE IF:   You have back pain.  You develop a fever.  Your symptoms do not begin to resolve within 3 days. SEEK IMMEDIATE MEDICAL CARE IF:   You have severe back pain or lower abdominal pain.  You develop chills.  You have nausea or vomiting.  You have continued burning or discomfort with urination. MAKE SURE YOU:   Understand these instructions.  Will watch your condition.  Will get help right away if you are not doing well or get worse.   This information is not intended to replace advice given to you by your health care provider. Make sure you discuss any questions you have with your health care provider.   Document Released: 11/07/2004 Document Revised: 10/19/2014 Document Reviewed: 03/08/2011 Elsevier Interactive Patient Education [employer].

## 2015-09-14 NOTE — Progress Notes (Signed)
Pre visit review using our clinic review tool, if applicable. No additional management support is needed unless otherwise documented below in the visit note. 

## 2015-09-17 LAB — URINE CULTURE

## 2015-09-19 NOTE — Progress Notes (Signed)
Left message for pt to call office

## 2015-09-19 NOTE — Progress Notes (Signed)
Called and left message to call office

## 2015-09-22 ENCOUNTER — Other Ambulatory Visit: Payer: Self-pay | Admitting: Family Medicine

## 2015-10-26 DIAGNOSIS — C44319 Basal cell carcinoma of skin of other parts of face: Secondary | ICD-10-CM | POA: Diagnosis not present

## 2015-10-26 DIAGNOSIS — D485 Neoplasm of uncertain behavior of skin: Secondary | ICD-10-CM | POA: Diagnosis not present

## 2015-10-26 DIAGNOSIS — L570 Actinic keratosis: Secondary | ICD-10-CM | POA: Diagnosis not present

## 2015-11-20 ENCOUNTER — Other Ambulatory Visit: Payer: Self-pay | Admitting: Family Medicine

## 2015-11-27 ENCOUNTER — Telehealth: Payer: Self-pay | Admitting: Family Medicine

## 2015-11-27 MED ORDER — HYDROCODONE-ACETAMINOPHEN 5-325 MG PO TABS: 1.0000 | ORAL_TABLET | Freq: Four times a day (QID) | ORAL | 0 refills | Status: DC | PRN

## 2015-11-27 NOTE — Telephone Encounter (Signed)
Last refill #40 on 09/06/2015 Last OV was CPE 08/23/2015 Please advise

## 2015-11-27 NOTE — Telephone Encounter (Signed)
Pt request refill  HYDROcodone-acetaminophen (NORCO/VICODIN) 5-325 MG tablet  Also, pt states she is in severe pain in her neck. Has not been able to sleep last night. Would like to know if Dr Elease Hashimoto will order an xray of her neck.  She thinkd another disc may have ruptured. Pt declined appt.

## 2015-11-27 NOTE — Telephone Encounter (Signed)
Pt has a pending appt on 10/20. Script is up front for pick up.

## 2015-11-27 NOTE — Telephone Encounter (Signed)
Refill once. Needs office follow-up before any x-rays ordered

## 2015-12-01 ENCOUNTER — Ambulatory Visit (INDEPENDENT_AMBULATORY_CARE_PROVIDER_SITE_OTHER): Payer: Medicare Other | Admitting: Family Medicine

## 2015-12-01 VITALS — BP 120/64 | HR 96 | Temp 98.1°F | Ht 70.0 in | Wt 155.2 lb

## 2015-12-01 DIAGNOSIS — M542 Cervicalgia: Secondary | ICD-10-CM | POA: Diagnosis not present

## 2015-12-01 MED ORDER — PREDNISONE 10 MG PO TABS: ORAL_TABLET | ORAL | 0 refills | Status: DC

## 2015-12-01 NOTE — Patient Instructions (Signed)

## 2015-12-01 NOTE — Progress Notes (Signed)
Subjective:     Patient ID: Laurie [name], female   DOB: 1955-07-12, 60 y.o.   MRN: [id number]  HPI Patient seen for neck pain for the past month. Left-sided neck pain. She describes a sharp pain and sometimes dull achy pain. Radiates toward the shoulder. Has had prior history of fusion C5-C6 about 6 years ago. She's had some intermittent neck pain since then. Previous problems were more right-sided. She feels she may have some weakness intermittently. She is left-handed. She's tried heat or ice with some improvement. Current pain level is 4/10 but sometimes 8 out of 10. We recently sent  prescription for Vicodin which has not helped much. Prednisone has helped some in the past. She had seen pain management the past but has not been there over a year. He's had previous epidural injections of limited benefit in the past.  Past Medical History:  Diagnosis Date  . Allergy   . Anxiety   . Arthritis    RA  . Asthma   . Chronic back pain   . Depression   . Esophageal stricture   . GERD (gastroesophageal reflux disease)   . Heart murmur   . Hyperlipidemia   . Hypertension   . IBS (irritable bowel syndrome)   . Interstitial cystitis   . Neuromuscular disorder ([place])    fibromyalgia   Past Surgical History:  Procedure Laterality Date  . ABDOMINAL HYSTERECTOMY    . APPENDECTOMY    . CERVICAL FUSION    . CERVICAL LAMINECTOMY    . ESOPHAGOGASTRODUODENOSCOPY    . FINGER SURGERY    . LUMBAR FUSION    . TONSILLECTOMY      reports that she has been smoking Cigarettes.  She has been smoking about 0.50 packs per day. She has never used smokeless tobacco. She reports that she does not drink alcohol or use drugs. family history includes Aneurysm in her brother; Cancer in her brother; Coronary artery disease in her father, paternal aunt, and paternal grandmother; Dementia in her mother; Esophageal cancer in her brother; Heart attack in her father; Stomach cancer in her paternal aunt. Allergies   Allergen Reactions  . Codeine     REACTION: Vomiting  . Lisinopril Cough  . Sulfonamide Derivatives     REACTION: Upset GI     Review of Systems  Respiratory: Negative for shortness of breath.   Cardiovascular: Negative for chest pain.  Neurological: Negative for numbness.       Objective:   Physical Exam  Constitutional: She appears well-developed and well-nourished.  Cardiovascular: Normal rate and regular rhythm.   Pulmonary/Chest: Effort normal and breath sounds normal. No respiratory distress. She has no wheezes. She has no rales.  Musculoskeletal: She exhibits no edema.  Full range of motion right shoulder.  Neurological:  Full strength upper extremities with symmetric reflexes. No muscle atrophy.       Assessment:     Left cervical neck pain. Prior history of fusion C5-C6. Current neuro exam is nonfocal    Plan:     -Trial of prednisone taper starting at 40 mg daily. Take with food and caution about potential side effects -She will continue with hydrocodone for as needed use -Follow-up promptly for any weakness or progressive pain -May need MRI for further assessment if symptoms not improving over the next 1-2 weeks  [name] MD [facility]

## 2016-01-03 ENCOUNTER — Telehealth: Payer: Self-pay | Admitting: Family Medicine

## 2016-01-03 MED ORDER — HYDROCODONE-ACETAMINOPHEN 5-325 MG PO TABS: 1.0000 | ORAL_TABLET | Freq: Four times a day (QID) | ORAL | 0 refills | Status: DC | PRN

## 2016-01-03 NOTE — Telephone Encounter (Signed)
Last OV 12-01-2015 Last refill 11-27-2015 #40 Please advise.

## 2016-01-03 NOTE — Telephone Encounter (Signed)
We can refill once, but if her pain is continuing at a level to necessitate opioids, I recommend follow up with her neurosurgeon or re-establishing with pain management.

## 2016-01-03 NOTE — Telephone Encounter (Signed)
Pt need new Rx for Hydrocodone  Pt state she is in a lot of pain and would like to see if she can get it before the holiday (Thanksgiving).  Pt is aware of the 3 business days for refills.

## 2016-01-03 NOTE — Telephone Encounter (Signed)
Pt is aware that script is up front for pick.

## 2016-01-10 DIAGNOSIS — C44311 Basal cell carcinoma of skin of nose: Secondary | ICD-10-CM | POA: Diagnosis not present

## 2016-02-08 ENCOUNTER — Telehealth: Payer: Self-pay | Admitting: Family Medicine

## 2016-02-08 NOTE — Telephone Encounter (Signed)
Last refilled 01/03/16 #40 refills 0  Last office visit 12/01/15  Okay to refill?

## 2016-02-08 NOTE — Telephone Encounter (Signed)
Pt need new Rx Hydrocodone  Pt state she is not feeling good and is out of medication.

## 2016-02-09 ENCOUNTER — Other Ambulatory Visit: Payer: Self-pay

## 2016-02-09 MED ORDER — HYDROCODONE-ACETAMINOPHEN 5-325 MG PO TABS: 1.0000 | ORAL_TABLET | Freq: Four times a day (QID) | ORAL | 0 refills | Status: DC | PRN

## 2016-02-09 NOTE — Telephone Encounter (Signed)
Pt is aware md out of office

## 2016-02-09 NOTE — Telephone Encounter (Signed)
Spoke with Laurie [name] to see if he felt comfortable filling this Rx for patient - he states it's okay to give 20 tabs until a follow up with Dr. Jacinto Reap.

## 2016-02-09 NOTE — Telephone Encounter (Signed)
Pt following up on refill request. Please advise.

## 2016-02-09 NOTE — Telephone Encounter (Signed)
Called and spoke with pt informing pt that prescription is up front ready for pick up. Pt verbalized understanding nothing further needed at this time.

## 2016-03-01 ENCOUNTER — Encounter: Payer: Self-pay | Admitting: Family Medicine

## 2016-03-01 ENCOUNTER — Ambulatory Visit (INDEPENDENT_AMBULATORY_CARE_PROVIDER_SITE_OTHER): Payer: Medicare Other | Admitting: Family Medicine

## 2016-03-01 VITALS — BP 142/92 | HR 102 | Temp 98.5°F

## 2016-03-01 DIAGNOSIS — R05 Cough: Secondary | ICD-10-CM | POA: Diagnosis not present

## 2016-03-01 DIAGNOSIS — R059 Cough, unspecified: Secondary | ICD-10-CM

## 2016-03-01 LAB — POCT INFLUENZA A/B
Influenza A, POC: NEGATIVE
Influenza B, POC: NEGATIVE

## 2016-03-01 MED ORDER — DOXYCYCLINE HYCLATE 100 MG PO CAPS: 100.0000 mg | ORAL_CAPSULE | Freq: Two times a day (BID) | ORAL | 0 refills | Status: DC

## 2016-03-01 MED ORDER — PREDNISONE 20 MG PO TABS: 40.0000 mg | ORAL_TABLET | Freq: Every day | ORAL | 0 refills | Status: DC

## 2016-03-01 MED ORDER — AMOXICILLIN-POT CLAVULANATE 875-125 MG PO TABS: 1.0000 | ORAL_TABLET | Freq: Two times a day (BID) | ORAL | 0 refills | Status: DC

## 2016-03-01 MED ORDER — BENZONATATE 200 MG PO CAPS: 200.0000 mg | ORAL_CAPSULE | Freq: Two times a day (BID) | ORAL | 0 refills | Status: DC | PRN

## 2016-03-01 NOTE — Progress Notes (Signed)
Pre visit review using our clinic review tool, if applicable. No additional management support is needed unless otherwise documented below in the visit note. 

## 2016-03-01 NOTE — Progress Notes (Signed)
History of Present Illness:    Laurie [name] is a 61 y.o. female here for evaluation of a cough. The cough is productive of green/yellow sputum, with wheezing, chest is painful during coughing and is aggravated by reclining position. Onset of symptoms was 2 weeks ago, rapidly worsening over the past two days.  Associated symptoms include night sweats and postnasal drip. Patient does not have a history of asthma. Patient has not had recent travel. Patient does have a history of smoking.   PMHx, SurgHx, SocialHx, Medications, and Allergies were reviewed in the Visit Navigator and updated as appropriate.   Past Medical History:  Diagnosis Date  . Allergy   . Anxiety   . Arthritis    RA  . Asthma   . Chronic back pain   . Depression   . Esophageal stricture   . GERD (gastroesophageal reflux disease)   . Heart murmur   . Hyperlipidemia   . Hypertension   . IBS (irritable bowel syndrome)   . Interstitial cystitis   . Neuromuscular disorder ([place])    fibromyalgia   Past Surgical History:  Procedure Laterality Date  . ABDOMINAL HYSTERECTOMY    . APPENDECTOMY    . CERVICAL FUSION    . CERVICAL LAMINECTOMY    . ESOPHAGOGASTRODUODENOSCOPY    . FINGER SURGERY    . LUMBAR FUSION    . TONSILLECTOMY        Family History  Problem Relation Age of Onset  . Dementia Mother   . Heart attack Father   . Coronary artery disease Father   . Cancer Brother     esophageal  . Esophageal cancer Brother   . Coronary artery disease Paternal Aunt   . Stomach cancer Paternal Aunt   . Coronary artery disease Paternal Grandmother   . Aneurysm Brother     aortic  . Rectal cancer Neg Hx   . Colon cancer Neg Hx    Social History  Substance Use Topics  . Smoking status: Current Every Day Smoker    Packs/day: 0.50    Types: Cigarettes  . Smokeless tobacco: Never Used     Comment: trying to quit   . Alcohol use No      Allergies  Allergen Reactions  . Codeine     REACTION: Vomiting    . Lisinopril Cough  . Sulfonamide Derivatives     REACTION: Upset GI    Prior to Admission medications   Medication Sig Start Date End Date Taking? Authorizing Provider  Cholecalciferol (DIALYVITE VITAMIN D3 MAX) 16109 units TABS Take 1 tablet by mouth once a week. 08/23/15   [name], MD  HYDROcodone-acetaminophen (NORCO/VICODIN) 5-325 MG tablet Take 1 tablet by mouth every 6 (six) hours as needed for moderate pain. 02/09/16   [name], NP  losartan-hydrochlorothiazide (HYZAAR) 100-12.5 MG tablet TAKE 1 TABLET BY MOUTH DAILY. 11/20/15   [name], MD  pantoprazole (PROTONIX) 40 MG tablet Take 1 tablet (40 mg total) by mouth 2 (two) times daily. Take 1 tablet 2 times daily 11/22/14   Laban Emperor Zehr, PA-C  rosuvastatin (CRESTOR) 20 MG tablet Take 1 tablet (20 mg total) by mouth daily. 11/08/14   [name], MD  venlafaxine XR (EFFEXOR-XR) 150 MG 24 hr capsule TAKE 1 CAPSULE (150 MG TOTAL) BY MOUTH DAILY. 09/22/15   [name], MD     Review of Systems:   No unusual headaches, no dizziness. No dyspnea or chest pain on exertion.  No abdominal pain, change in bowel habits, black or bloody stools.  No urinary tract symptoms. No new or unusual musculoskeletal symptoms. No edema.   Vitals:   Vitals:   03/01/16 1604  BP: (!) 142/92  Pulse: (!) 102  Temp: 98.5 F (36.9 C)  TempSrc: Oral  SpO2: 98%     There is no height or weight on file to calculate BMI.   Physical Exam:   General appearance: alert, cooperative and appears stated age 1: PERRLA and oropharynx clear, no lesions Lungs: central lung congestion, diffuse wheeze, without increased WOB Heart: regular rate and rhythm, S1, S2 normal, no murmur, click, rub or gallop Abdomen: soft, non-tender; bowel sounds normal; no masses,  no organomegaly Extremities: extremities normal, atraumatic, no cyanosis or edema Pulses: 2+ and symmetric Skin: Skin color, texture, turgor normal. No rashes or  lesions Neurologic: Grossly normal   Results for orders placed or performed in visit on 03/01/16  POC Influenza A/B  Result Value Ref Range   Influenza A, POC Negative Negative   Influenza B, POC Negative Negative     Assessment and Plan:    Laurie [name] was seen today for acute visit.  Diagnoses and all orders for this visit:  Cough, concern for bronchoPNA -     POC Influenza A/B -     predniSONE (DELTASONE) 20 MG tablet; Take 2 tablets (40 mg total) by mouth daily. -     benzonatate (TESSALON) 200 MG capsule; Take 1 capsule (200 mg total) by mouth 2 (two) times daily as needed for cough. -     amoxicillin-clavulanate (AUGMENTIN) 875-125 MG tablet; Take 1 tablet by mouth 2 (two) times daily.   . Reviewed expectations re: course of current medical issues. . Discussed self-management of symptoms. . Outlined signs and symptoms indicating need for more acute intervention. . Patient verbalized understanding and all questions were answered. . See orders for this visit as documented in the electronic medical record. . Patient received an After Visit Summary.   Briscoe Deutscher, D.O.

## 2016-03-06 ENCOUNTER — Telehealth: Payer: Self-pay | Admitting: Family Medicine

## 2016-03-06 NOTE — Telephone Encounter (Signed)
° ° ° °  Pt call to ask for a refill on the below med   predniSONE (DELTASONE) 20 MG tablet

## 2016-03-07 NOTE — Telephone Encounter (Signed)
Pt was recently seen on 03/01/2016. Should she come back in for re evaluation?

## 2016-03-08 NOTE — Telephone Encounter (Signed)
If no better, needs follow up.

## 2016-03-08 NOTE — Telephone Encounter (Signed)
Please schedule follow up

## 2016-03-08 NOTE — Telephone Encounter (Signed)
Pt has been scheduled.  °

## 2016-03-11 ENCOUNTER — Encounter: Payer: Self-pay | Admitting: Family Medicine

## 2016-03-11 ENCOUNTER — Ambulatory Visit (INDEPENDENT_AMBULATORY_CARE_PROVIDER_SITE_OTHER): Payer: Medicare Other | Admitting: Family Medicine

## 2016-03-11 VITALS — BP 100/56 | Temp 98.3°F | Ht 70.0 in | Wt 157.0 lb

## 2016-03-11 DIAGNOSIS — R05 Cough: Secondary | ICD-10-CM | POA: Diagnosis not present

## 2016-03-11 DIAGNOSIS — R0600 Dyspnea, unspecified: Secondary | ICD-10-CM

## 2016-03-11 DIAGNOSIS — R61 Generalized hyperhidrosis: Secondary | ICD-10-CM | POA: Diagnosis not present

## 2016-03-11 DIAGNOSIS — R059 Cough, unspecified: Secondary | ICD-10-CM

## 2016-03-11 DIAGNOSIS — R630 Anorexia: Secondary | ICD-10-CM | POA: Diagnosis not present

## 2016-03-11 LAB — BASIC METABOLIC PANEL
BUN: 12 mg/dL (ref 6–23)
CHLORIDE: 102 meq/L (ref 96–112)
CO2: 31 meq/L (ref 19–32)
Calcium: 9.5 mg/dL (ref 8.4–10.5)
Creatinine, Ser: 0.88 mg/dL (ref 0.40–1.20)
GFR: 69.36 mL/min (ref >60.00)
GLUCOSE: 103 mg/dL — AB (ref 70–99)
POTASSIUM: 4.2 meq/L (ref 3.5–5.1)
Sodium: 137 mEq/L (ref 135–145)

## 2016-03-11 LAB — CBC WITH DIFFERENTIAL/PLATELET
BASOS ABS: 0.1 10*3/uL (ref 0.0–0.1)
BASOS PCT: 0.6 % (ref 0.0–3.0)
EOS ABS: 0.3 10*3/uL (ref 0.0–0.7)
Eosinophils Relative: 2.6 % (ref 0.0–5.0)
HCT: 38.3 % (ref 36.0–46.0)
HEMOGLOBIN: 13 g/dL (ref 12.0–15.0)
LYMPHS PCT: 18.9 % (ref 12.0–46.0)
Lymphs Abs: 2.2 10*3/uL (ref 0.7–4.0)
MCHC: 33.9 g/dL (ref 30.0–36.0)
MCV: 86.3 fl (ref 78.0–100.0)
MONO ABS: 0.8 10*3/uL (ref 0.1–1.0)
Monocytes Relative: 6.6 % (ref 3.0–12.0)
Neutro Abs: 8.3 10*3/uL — ABNORMAL HIGH (ref 1.4–7.7)
Neutrophils Relative %: 71.3 % (ref 43.0–77.0)
Platelets: 335 10*3/uL (ref 150.0–400.0)
RBC: 4.44 Mil/uL (ref 3.87–5.11)
RDW: 12.9 % (ref 11.5–15.5)
WBC: 11.6 10*3/uL — AB (ref 4.0–10.5)

## 2016-03-11 NOTE — Progress Notes (Signed)
Subjective:     Patient ID: Laurie [name], female   DOB: [birth year], 61 y.o.   MRN: [id number]  HPI Patient seen with persistent cough. Recent note reviewed. She was seen here on the 19th and at that point, had about 3 weeks of productive cough and intermittent night sweats and malaise. She was placed on Augmentin and also prednisone. She does have a long history of smoking and still smokes about half pack cigarettes per day. Denies any recent hemoptysis. She's had decreased appetite. No weight change since last October. She's not had any documented fevers. She has generalized weakness. No nausea or vomiting. She did feel better briefly when taking the prednisone.  She has a long history of GERD and intermittent dysphagia and has had esophageal dilatation 3 times in the past. She recently noticed some mild increase in dysphagia symptoms. She's not aware of any aspiration episodes. Did not have chest x-ray with recent episode of coughing  She relates some crampy abdominal pain left lower quadrant but no diarrhea. Symptoms are intermittent.  She has multiple chronic problems including hypertension, CAD, GERD, IBS, fibromyalgia, interstitial cystitis  Past Medical History:  Diagnosis Date  . Allergy   . Anxiety   . Arthritis    RA  . Asthma   . Chronic back pain   . Depression   . Esophageal stricture   . GERD (gastroesophageal reflux disease)   . Heart murmur   . Hyperlipidemia   . Hypertension   . IBS (irritable bowel syndrome)   . Interstitial cystitis   . Neuromuscular disorder ([place])    fibromyalgia   Past Surgical History:  Procedure Laterality Date  . ABDOMINAL HYSTERECTOMY    . APPENDECTOMY    . CERVICAL FUSION    . CERVICAL LAMINECTOMY    . ESOPHAGOGASTRODUODENOSCOPY    . FINGER SURGERY    . LUMBAR FUSION    . TONSILLECTOMY      reports that she has been smoking Cigarettes.  She has been smoking about 0.50 packs per day. She has never used smokeless tobacco. She  reports that she does not drink alcohol or use drugs. family history includes Aneurysm in her brother; Cancer in her brother; Coronary artery disease in her father, paternal aunt, and paternal grandmother; Dementia in her mother; Esophageal cancer in her brother; Heart attack in her father; Stomach cancer in her paternal aunt. Allergies  Allergen Reactions  . Codeine     REACTION: Vomiting  . Lisinopril Cough  . Sulfonamide Derivatives     REACTION: Upset GI     Review of Systems  Constitutional: Positive for appetite change and fatigue. Negative for chills, fever and unexpected weight change.  Respiratory: Positive for cough and shortness of breath. Negative for wheezing.   Cardiovascular: Negative for chest pain, palpitations and leg swelling.  Gastrointestinal: Positive for abdominal pain. Negative for abdominal distention, blood in stool, diarrhea, nausea and vomiting.  Genitourinary: Negative for dysuria.       Objective:   Physical Exam  Constitutional: She appears well-developed and well-nourished.  HENT:  Right Ear: External ear normal.  Left Ear: External ear normal.  Mouth/Throat: Oropharynx is clear and moist.  Neck: Neck supple.  Cardiovascular: Normal rate and regular rhythm.   Pulmonary/Chest: Effort normal and breath sounds normal. No respiratory distress. She has no wheezes. She has no rales.  Abdominal: Soft. She exhibits no distension and no mass. There is no rebound and no guarding.  She has some mild tenderness on  palpating the right and left lower quadrant with no guarding or rebound.  Musculoskeletal: She exhibits no edema.       Assessment:     Patient presents with over one month history of persistent cough, malaise, generalized weakness, and night sweats without documented fever. Long history of smoking as above    Plan:     -Obtain chest x-ray to further assess -Check labs with CBC and basic metabolic panel -Strongly encouraged to stop  smoking -no clear indication for further antibiotics yet - needs GI follow up if she has any progressive dysphagia.  [name] MD [facility] [address]

## 2016-03-11 NOTE — Patient Instructions (Signed)
Follow up for any fever, increased shortness of breath, or other concerns.

## 2016-03-11 NOTE — Progress Notes (Signed)
Pre visit review using our clinic review tool, if applicable. No additional management support is needed unless otherwise documented below in the visit note. 

## 2016-03-12 ENCOUNTER — Telehealth: Payer: Self-pay | Admitting: Family Medicine

## 2016-03-12 NOTE — Telephone Encounter (Signed)
Pt was just seen yesterday. Last refill on 02/08/2017 #20. Please advise.

## 2016-03-12 NOTE — Telephone Encounter (Signed)
Refill once.  Avoid regular use. 

## 2016-03-12 NOTE — Telephone Encounter (Signed)
Pt would like results of labs done yesterday as well.  Pt forgot to ask for pain med yesterday

## 2016-03-12 NOTE — Telephone Encounter (Signed)
Pt request refill  HYDROcodone-acetaminophen (NORCO/VICODIN) 5-325 MG tablet  Pt would like Dr to know the pain is still there and has moved more to her left side.

## 2016-03-13 ENCOUNTER — Ambulatory Visit (INDEPENDENT_AMBULATORY_CARE_PROVIDER_SITE_OTHER)
Admission: RE | Admit: 2016-03-13 | Discharge: 2016-03-13 | Disposition: A | Discharge status: Home / Self Care | Payer: Medicare Other | Source: Ambulatory Visit | Attending: Family Medicine | Admitting: Family Medicine

## 2016-03-13 DIAGNOSIS — R05 Cough: Secondary | ICD-10-CM | POA: Diagnosis not present

## 2016-03-13 DIAGNOSIS — R059 Cough, unspecified: Secondary | ICD-10-CM

## 2016-03-13 DIAGNOSIS — R0600 Dyspnea, unspecified: Secondary | ICD-10-CM

## 2016-03-13 MED ORDER — HYDROCODONE-ACETAMINOPHEN 5-325 MG PO TABS: 1.0000 | ORAL_TABLET | Freq: Four times a day (QID) | ORAL | 0 refills | Status: DC | PRN

## 2016-03-13 NOTE — Telephone Encounter (Signed)
Pt is aware that script has been placed up front.

## 2016-03-14 ENCOUNTER — Encounter: Payer: Self-pay | Admitting: Family Medicine

## 2016-03-27 ENCOUNTER — Telehealth: Payer: Self-pay | Admitting: Family Medicine

## 2016-03-27 NOTE — Telephone Encounter (Signed)
Last OV her cervical pain was discussed 12-01-2015 Last refill 03-13-2016 #20, 0rf Please advise

## 2016-03-27 NOTE — Telephone Encounter (Signed)
Pt states the pain is severe in her neck. Pain goes down mid back, has moved from left to right.  Pain is greater than a 10. Would like to know if you can order a MRI  or xray of her neck/ upper back to access.  Pt also states she needs a stronger pain med. The HYDROcodone-acetaminophen (NORCO/VICODIN) 5-325 MG tablet   is not working. Pt can barely stand the pain. Needs relief today please

## 2016-03-28 ENCOUNTER — Other Ambulatory Visit: Payer: Self-pay

## 2016-03-28 NOTE — Telephone Encounter (Signed)
Pt has a pending appt Monday. Please advise on medication.

## 2016-03-28 NOTE — Telephone Encounter (Signed)
Recommend set up follow up with me as soon as possible. Could offer follow up with another provider sooner (I don't think I have any openings tomorrow) She will very likely need repeat MRI cervical spine and referral back to Dr Ellene Route but would assess here first.

## 2016-03-28 NOTE — Telephone Encounter (Signed)
We could do some limited Percocet 5/325 mg 1-2 po q 6 hours prn #30.  Take hydrocodone off med list.  thx

## 2016-03-28 NOTE — Telephone Encounter (Signed)
Left message advising pt to call back to schedule follow up.

## 2016-03-29 MED ORDER — OXYCODONE-ACETAMINOPHEN 5-325 MG PO TABS: 1.0000 | ORAL_TABLET | Freq: Four times a day (QID) | ORAL | 0 refills | Status: DC | PRN

## 2016-03-29 NOTE — Telephone Encounter (Signed)
Pt is aware that script is up front.

## 2016-03-29 NOTE — Telephone Encounter (Signed)
Left message for pt to call back. Script printed for signature.

## 2016-04-01 ENCOUNTER — Ambulatory Visit: Payer: Medicare Other | Admitting: Family Medicine

## 2016-04-02 ENCOUNTER — Encounter: Payer: Self-pay | Admitting: Family Medicine

## 2016-04-02 ENCOUNTER — Ambulatory Visit (INDEPENDENT_AMBULATORY_CARE_PROVIDER_SITE_OTHER): Payer: Medicare Other | Admitting: Family Medicine

## 2016-04-02 VITALS — BP 110/68 | HR 88 | Ht 70.0 in | Wt 160.0 lb

## 2016-04-02 DIAGNOSIS — M5412 Radiculopathy, cervical region: Secondary | ICD-10-CM

## 2016-04-02 MED ORDER — PREDNISONE 10 MG PO TABS: ORAL_TABLET | ORAL | 0 refills | Status: DC

## 2016-04-02 NOTE — Progress Notes (Signed)
Pre visit review using our clinic review tool, if applicable. No additional management support is needed unless otherwise documented below in the visit note. 

## 2016-04-02 NOTE — Patient Instructions (Signed)
Cervical Radiculopathy Introduction Cervical radiculopathy happens when a nerve in the neck (cervical nerve) is pinched or bruised. This condition can develop because of an injury or as part of the normal aging process. Pressure on the cervical nerves can cause pain or numbness that runs from the neck all the way down into the arm and fingers. Usually, this condition gets better with rest. Treatment may be needed if the condition does not improve. What are the causes? This condition may be caused by:  Injury.  Slipped (herniated) disk.  Muscle tightness in the neck because of overuse.  Arthritis.  Breakdown or degeneration in the bones and joints of the spine (spondylosis) due to aging.  Bone spurs that may develop near the cervical nerves. What are the signs or symptoms? Symptoms of this condition include:  Pain that runs from the neck to the arm and hand. The pain can be severe or irritating. It may be worse when the neck is moved.  Numbness or weakness in the affected arm and hand. How is this diagnosed? This condition may be diagnosed based on symptoms, medical history, and a physical exam. You may also have tests, including:  X-rays.  CT scan.  MRI.  Electromyogram (EMG).  Nerve conduction tests. How is this treated? In many cases, treatment is not needed for this condition. With rest, the condition usually gets better over time. If treatment is needed, options may include:  Wearing a soft neck collar for short periods of time.  Physical therapy to strengthen your neck muscles.  Medicines, such as NSAIDs, oral corticosteroids, or spinal injections.  Surgery. This may be needed if other treatments do not help. Various types of surgery may be done depending on the cause of your problems. Follow these instructions at home: Managing pain  Take over-the-counter and prescription medicines only as told by your health care provider.  If directed, apply ice to the  affected area.  Put ice in a plastic bag.  Place a towel between your skin and the bag.  Leave the ice on for 20 minutes, 2-3 times per day.  If ice does not help, you can try using heat. Take a warm shower or warm bath, or use a heat pack as told by your health care provider.  Try a gentle neck and shoulder massage to help relieve symptoms. Activity  Rest as needed. Follow instructions from your health care provider about any restrictions on activities.  Do stretching and strengthening exercises as told by your health care provider or physical therapist. General instructions  If you were given a soft collar, wear it as told by your health care provider.  Use a flat pillow when you sleep.  Keep all follow-up visits as told by your health care provider. This is important. Contact a health care provider if:  Your condition does not improve with treatment. Get help right away if:  Your pain gets much worse and cannot be controlled with medicines.  You have weakness or numbness in your hand, arm, face, or leg.  You have a high fever.  You have a stiff, rigid neck.  You lose control of your bowels or your bladder (have incontinence).  You have trouble with walking, balance, or speaking. This information is not intended to replace advice given to you by your health care provider. Make sure you discuss any questions you have with your health care provider. Document Released: 10/23/2000 Document Revised: 07/06/2015 Document Reviewed: 03/24/2014  2017 Elsevier

## 2016-04-02 NOTE — Progress Notes (Addendum)
Subjective:     Patient ID: Laurie [name], female   DOB: 02/16/1955, 61 y.o.   MRN: [id number]  HPI Patient seen with left-sided neck pain. She's had previous cervical laminectomy and fusion several years ago. For several weeks now she's had progressive left neck pain which is extremely sharp at times with movement of the head or neck. She's had some occasional right hand numbness but her pain is mostly left-sided. At baseline about 5-6 out of 10 but 10 out of 10 occasionally. She had thoracic MRI over year ago which showed distal extrusion T2-T3 but this pain is different. Location is around the mid cervical spine region. Took Percocet last week which helped. Her pain is about 40% better this week. Denies any recent injury.  Past Medical History:  Diagnosis Date  . Allergy   . Anxiety   . Arthritis    RA  . Asthma   . Chronic back pain   . Depression   . Esophageal stricture   . GERD (gastroesophageal reflux disease)   . Heart murmur   . Hyperlipidemia   . Hypertension   . IBS (irritable bowel syndrome)   . Interstitial cystitis   . Neuromuscular disorder ([place])    fibromyalgia   Past Surgical History:  Procedure Laterality Date  . ABDOMINAL HYSTERECTOMY    . APPENDECTOMY    . CERVICAL FUSION    . CERVICAL LAMINECTOMY    . ESOPHAGOGASTRODUODENOSCOPY    . FINGER SURGERY    . LUMBAR FUSION    . TONSILLECTOMY      reports that she has been smoking Cigarettes.  She has been smoking about 0.50 packs per day. She has never used smokeless tobacco. She reports that she does not drink alcohol or use drugs. family history includes Aneurysm in her brother; Cancer in her brother; Coronary artery disease in her father, paternal aunt, and paternal grandmother; Dementia in her mother; Esophageal cancer in her brother; Heart attack in her father; Stomach cancer in her paternal aunt. Allergies  Allergen Reactions  . Codeine     REACTION: Vomiting  . Lisinopril Cough  . Sulfonamide  Derivatives     REACTION: Upset GI     Review of Systems  Constitutional: Negative for appetite change and unexpected weight change.  Respiratory: Negative for shortness of breath.   Cardiovascular: Negative for chest pain.  Neurological: Positive for numbness. Negative for weakness.       Objective:   Physical Exam  Constitutional: She appears well-developed and well-nourished.  Cardiovascular: Normal rate and regular rhythm.   Pulmonary/Chest: Effort normal and breath sounds normal. No respiratory distress. She has no wheezes. She has no rales.  Musculoskeletal:  She has limited range of motion cervical spine with lateral bending or rotation to either side. She has some pain with extreme neck flexion.  Neurological:  Full strength upper extremities with symmetric reflexes       Assessment:     Progressive cervical neck pain in a patient with remote history of cervical laminectomy and fusion.  She has some left sided radiculitis symptoms.    Plan:     -Prednisone taper starting at 40 mg daily -Continue Percocet as needed for pain -Set up MRI cervical spine to further assess -May need referral back to neurosurgeon for further evaluation  [name] MD [name]  MRI showed disc protrusion at C3-C4 with left C4 nerve root impingement.  Will set up to see neurosurgeon.  Darnell Level  Dan Europe MD [facility] at [facility]

## 2016-04-06 ENCOUNTER — Ambulatory Visit
Admission: RE | Admit: 2016-04-06 | Discharge: 2016-04-06 | Disposition: A | Discharge status: Home / Self Care | Payer: Medicare Other | Source: Ambulatory Visit | Attending: Family Medicine | Admitting: Family Medicine

## 2016-04-06 DIAGNOSIS — M4802 Spinal stenosis, cervical region: Secondary | ICD-10-CM | POA: Diagnosis not present

## 2016-04-06 DIAGNOSIS — M5412 Radiculopathy, cervical region: Secondary | ICD-10-CM

## 2016-04-07 NOTE — Addendum Note (Signed)
Addended by: [name] on: 04/07/2016 10:00 PM   Modules accepted: Orders

## 2016-04-09 ENCOUNTER — Other Ambulatory Visit: Payer: Self-pay

## 2016-04-09 MED ORDER — OXYCODONE-ACETAMINOPHEN 5-325 MG PO TABS: 1.0000 | ORAL_TABLET | Freq: Four times a day (QID) | ORAL | 0 refills | Status: DC | PRN

## 2016-04-17 DIAGNOSIS — M5412 Radiculopathy, cervical region: Secondary | ICD-10-CM | POA: Diagnosis not present

## 2016-05-03 DIAGNOSIS — M5412 Radiculopathy, cervical region: Secondary | ICD-10-CM | POA: Diagnosis not present

## 2016-05-03 DIAGNOSIS — M5020 Other cervical disc displacement, unspecified cervical region: Secondary | ICD-10-CM | POA: Diagnosis not present

## 2016-05-03 DIAGNOSIS — Z981 Arthrodesis status: Secondary | ICD-10-CM | POA: Diagnosis not present

## 2016-05-13 ENCOUNTER — Other Ambulatory Visit: Payer: Self-pay | Admitting: Family Medicine

## 2016-05-22 DIAGNOSIS — M5412 Radiculopathy, cervical region: Secondary | ICD-10-CM | POA: Diagnosis not present

## 2016-05-26 ENCOUNTER — Other Ambulatory Visit: Payer: Self-pay | Admitting: Family Medicine

## 2016-06-05 ENCOUNTER — Telehealth: Payer: Self-pay | Admitting: Family Medicine

## 2016-06-05 NOTE — Telephone Encounter (Signed)
We cannot prescribe pain medications concomitant with back specialist.  One provider and one office should be involved with her pain medication prescribing.  She needs to let them know if pain not adequately controlled.

## 2016-06-05 NOTE — Telephone Encounter (Signed)
Patient is aware 

## 2016-06-05 NOTE — Telephone Encounter (Signed)
Pt would like to see if Dr. Elease Hashimoto would give her some pain medication for the pain from the ruptured disc in the upper back and has had two artificial disc replaced in her neck on the 05/03/16 and surgeon would only give her a month worth and now she is out and would like to have something to ease the pain.  Pt would be able to come in in a month after getting paid.

## 2016-07-24 DIAGNOSIS — M5412 Radiculopathy, cervical region: Secondary | ICD-10-CM | POA: Diagnosis not present

## 2016-09-18 DIAGNOSIS — M542 Cervicalgia: Secondary | ICD-10-CM | POA: Diagnosis not present

## 2016-09-18 DIAGNOSIS — I10 Essential (primary) hypertension: Secondary | ICD-10-CM | POA: Diagnosis not present

## 2016-09-18 DIAGNOSIS — M5412 Radiculopathy, cervical region: Secondary | ICD-10-CM | POA: Diagnosis not present

## 2016-09-30 ENCOUNTER — Telehealth: Payer: Self-pay | Admitting: Family Medicine

## 2016-09-30 NOTE — Telephone Encounter (Signed)
Patient Name: Laurie [name] DOB: April 19, 1955 Initial Comment Caller has been having lower back pain since friday . She wants x-rays ordered at [facility] of upper and lower back . Nurse Assessment Nurse: Ronnald Ramp, RN, Miranda Date/Time (Eastern Time): 09/30/2016 1:55:09 PM Confirm and document reason for call. If symptomatic, describe symptoms. ---Caller states she has been pain in her lower back since Friday. Does the patient have any new or worsening symptoms? ---Yes Will a triage be completed? ---Yes Related visit to physician within the last 2 weeks? ---No Does the PT have any chronic conditions? (i.e. diabetes, asthma, etc.) ---Yes List chronic conditions. ---Hx of back and neck surgery Is this a behavioral health or substance abuse call? ---No Guidelines Guideline Title Affirmed Question Affirmed Notes Back Pain [1] Abdominal pain AND [2] age > 14 Final Disposition User Go to ED Now (or PCP triage) Ronnald Ramp, RN, Miranda Comments Rapid busy signal. Number incorrect per EPIC review. Referrals [facility] - ED Disagree/Comply: Comply

## 2016-09-30 NOTE — Telephone Encounter (Signed)
Pt declined ED. Scheduled to see Dr.Burchette tomorrow. Nothing further needed at this time.

## 2016-10-01 ENCOUNTER — Ambulatory Visit (INDEPENDENT_AMBULATORY_CARE_PROVIDER_SITE_OTHER): Payer: Medicare Other | Admitting: Family Medicine

## 2016-10-01 ENCOUNTER — Encounter: Payer: Self-pay | Admitting: Family Medicine

## 2016-10-01 VITALS — BP 122/88 | HR 109 | Temp 98.6°F | Wt 156.4 lb

## 2016-10-01 DIAGNOSIS — I10 Essential (primary) hypertension: Secondary | ICD-10-CM | POA: Diagnosis not present

## 2016-10-01 DIAGNOSIS — E785 Hyperlipidemia, unspecified: Secondary | ICD-10-CM

## 2016-10-01 DIAGNOSIS — M545 Low back pain: Secondary | ICD-10-CM | POA: Diagnosis not present

## 2016-10-01 DIAGNOSIS — R7989 Other specified abnormal findings of blood chemistry: Secondary | ICD-10-CM | POA: Diagnosis not present

## 2016-10-01 MED ORDER — PREDNISONE 10 MG PO TABS: ORAL_TABLET | ORAL | 0 refills | Status: DC

## 2016-10-01 MED ORDER — ROSUVASTATIN CALCIUM 20 MG PO TABS: 20.0000 mg | ORAL_TABLET | Freq: Every day | ORAL | 11 refills | Status: DC

## 2016-10-01 MED ORDER — CHOLECALCIFEROL 1.25 MG (50000 UT) PO TABS: 1.0000 | ORAL_TABLET | ORAL | 3 refills | Status: DC

## 2016-10-01 NOTE — Patient Instructions (Signed)
Let me know in one week if back pain not improved.

## 2016-10-01 NOTE — Progress Notes (Signed)
Subjective:     Patient ID: Laurie [name], female   DOB: 01-15-1956, 61 y.o.   MRN: [id number]  HPI Patient seen today with low back pain. She states Friday she was bending over and felt a sudden sharp pain in her mid to lower lumbar area with radiation bilaterally. Right radiculitis symptoms slightly greater than left. Her pain is fairly intense at times. She had cervical disc surgery back in April and has recovered from that. She's had some persistent chronic pain and has being getting oxycodone 5 mg 60 tablets every couple weeks through neurosurgery. She has taken some recent Robaxin without much improvement.  Denies any urine or stool incontinence. No significant numbness in her legs. She has some generalized weakness. Very inactive for several months.  Other chronic problems include hypertension, history of IBS, CAD, dyslipidemia, interstitial cystitis. She is not apparently taking Crestor. That ran out recently. She's had history of very low vitamin D and has not been on replacement several months. Denies any recent chest pains.  Past Medical History:  Diagnosis Date  . Allergy   . Anxiety   . Arthritis    RA  . Asthma   . Chronic back pain   . Depression   . Esophageal stricture   . GERD (gastroesophageal reflux disease)   . Heart murmur   . Hyperlipidemia   . Hypertension   . IBS (irritable bowel syndrome)   . Interstitial cystitis   . Neuromuscular disorder ([place])    fibromyalgia   Past Surgical History:  Procedure Laterality Date  . ABDOMINAL HYSTERECTOMY    . APPENDECTOMY    . CERVICAL FUSION    . CERVICAL LAMINECTOMY    . ESOPHAGOGASTRODUODENOSCOPY    . FINGER SURGERY    . LUMBAR FUSION    . TONSILLECTOMY      reports that she has been smoking Cigarettes.  She has been smoking about 0.50 packs per day. She has never used smokeless tobacco. She reports that she does not drink alcohol or use drugs. family history includes Aneurysm in her brother; Cancer in her  brother; Coronary artery disease in her father, paternal aunt, and paternal grandmother; Dementia in her mother; Esophageal cancer in her brother; Heart attack in her father; Stomach cancer in her paternal aunt. Allergies  Allergen Reactions  . Codeine     REACTION: Vomiting  . Lisinopril Cough  . Sulfonamide Derivatives     REACTION: Upset GI     Review of Systems  Constitutional: Negative for chills and fever.  Eyes: Negative for visual disturbance.  Respiratory: Negative for cough, chest tightness, shortness of breath and wheezing.   Cardiovascular: Negative for chest pain, palpitations and leg swelling.  Gastrointestinal: Negative for abdominal pain.  Endocrine: Negative for polydipsia and polyuria.  Genitourinary: Negative for dysuria.  Musculoskeletal: Positive for back pain and neck pain.  Neurological: Negative for dizziness, seizures, syncope, weakness, light-headedness and headaches.       Objective:   Physical Exam  Constitutional: She appears well-developed and well-nourished.  Cardiovascular: Normal rate and regular rhythm.   Pulmonary/Chest: Effort normal and breath sounds normal. No respiratory distress. She has no wheezes. She has no rales.  Musculoskeletal: She exhibits no edema.  Straight leg raise are negative bilaterally  Neurological:  2+ reflexes knee and ankle bilaterally. She has poor effort with strength testing with plantar flexion, dorsiflexion, and knee extension bilaterally       Assessment:     #1 acute lower lumbar back pain  a patient with remote history of lumbar disc surgery. Nonfocal neuro exam currently. She is describing somewhat bilateral radiculitis symptoms right greater than left  #2 hypertension stable and at goal  #3 dyslipidemia and history of CAD currently not on statin therapy  #4 history of low vitamin D    Plan:     -We recommended prednisone taper for her acute lumbar back pain -She will continue close follow-up with  neurosurgery regarding her neck pain. -Patient requesting pain medicine here but she has being getting oxycodone every 2 weeks from neurosurgery and this is deferred back to them. She will discuss possible chronic pain management with them since she's been on this for several months -Get back on Crestor 20 mg daily with refills given and plan follow-up lipids and hepatic at follow-up in 3 months -Get back on vitamin D replacement 50,000 international units once weekly and recheck vitamin D level follow-up -We recommended she schedule physical for her three-month follow-up  [name] MD [facility] at [facility]

## 2016-10-04 ENCOUNTER — Telehealth: Payer: Self-pay | Admitting: Family Medicine

## 2016-10-04 NOTE — Telephone Encounter (Signed)
Pt would like to see if she can get something for the lower back pain.  Pt state that the surgeon is not going to give her any pain medication for her back b/c he was not seeing her for her lower back pain.  Pt really would like to see if she can get Oxycodone for the pain.

## 2016-10-04 NOTE — Telephone Encounter (Signed)
No.  They have been prescribing her pain meds and we cannot since they have been.

## 2016-10-04 NOTE — Telephone Encounter (Signed)
Patient is aware and plans on going back to the surgeon.

## 2016-10-10 DIAGNOSIS — M545 Low back pain: Secondary | ICD-10-CM | POA: Diagnosis not present

## 2016-10-17 ENCOUNTER — Telehealth: Payer: Self-pay | Admitting: Family Medicine

## 2016-10-17 MED ORDER — PREDNISONE 10 MG PO TABS
ORAL_TABLET | ORAL | 0 refills | Status: DC
Start: 2016-10-17 — End: 2017-03-14

## 2016-10-17 NOTE — Telephone Encounter (Signed)
Rx sent. Patient is aware.  

## 2016-10-17 NOTE — Telephone Encounter (Signed)
May refill once. 

## 2016-10-17 NOTE — Telephone Encounter (Signed)
Pt is calling stating that she is having more pain in her back and shoulder and would like to see if she can get a refill on prednisone.   Pharm:  [facility] on [address]

## 2016-10-27 ENCOUNTER — Other Ambulatory Visit: Payer: Self-pay | Admitting: Family Medicine

## 2016-10-31 ENCOUNTER — Encounter: Payer: Self-pay | Admitting: Family Medicine

## 2016-12-16 ENCOUNTER — Other Ambulatory Visit: Payer: Self-pay | Admitting: Family Medicine

## 2016-12-23 ENCOUNTER — Encounter: Payer: Medicare Other | Admitting: Family Medicine

## 2017-01-15 ENCOUNTER — Other Ambulatory Visit: Payer: Self-pay | Admitting: Family Medicine

## 2017-02-10 ENCOUNTER — Encounter: Payer: Medicare Other | Admitting: Family Medicine

## 2017-03-14 ENCOUNTER — Encounter: Payer: Self-pay | Admitting: Family Medicine

## 2017-03-14 ENCOUNTER — Ambulatory Visit (INDEPENDENT_AMBULATORY_CARE_PROVIDER_SITE_OTHER): Payer: Medicare Other | Admitting: Family Medicine

## 2017-03-14 VITALS — BP 100/58 | HR 92 | Temp 97.7°F | Ht 69.75 in | Wt 164.6 lb

## 2017-03-14 DIAGNOSIS — M2550 Pain in unspecified joint: Secondary | ICD-10-CM

## 2017-03-14 DIAGNOSIS — Z0001 Encounter for general adult medical examination with abnormal findings: Secondary | ICD-10-CM | POA: Diagnosis not present

## 2017-03-14 DIAGNOSIS — Z0000 Encounter for general adult medical examination without abnormal findings: Secondary | ICD-10-CM

## 2017-03-14 DIAGNOSIS — Z23 Encounter for immunization: Secondary | ICD-10-CM

## 2017-03-14 DIAGNOSIS — E559 Vitamin D deficiency, unspecified: Secondary | ICD-10-CM

## 2017-03-14 LAB — HEPATIC FUNCTION PANEL
ALT: 9 U/L (ref 0–35)
AST: 10 U/L (ref 0–37)
Albumin: 4.1 g/dL (ref 3.5–5.2)
Alkaline Phosphatase: 65 U/L (ref 39–117)
BILIRUBIN DIRECT: 0.1 mg/dL (ref 0.0–0.3)
BILIRUBIN TOTAL: 0.3 mg/dL (ref 0.2–1.2)
Total Protein: 6.8 g/dL (ref 6.0–8.3)

## 2017-03-14 LAB — CBC WITH DIFFERENTIAL/PLATELET
BASOS ABS: 0.1 10*3/uL (ref 0.0–0.1)
BASOS PCT: 0.7 % (ref 0.0–3.0)
EOS ABS: 0.5 10*3/uL (ref 0.0–0.7)
Eosinophils Relative: 4.6 % (ref 0.0–5.0)
HCT: 39.5 % (ref 36.0–46.0)
Hemoglobin: 13.5 g/dL (ref 12.0–15.0)
LYMPHS ABS: 2.1 10*3/uL (ref 0.7–4.0)
Lymphocytes Relative: 20.3 % (ref 12.0–46.0)
MCHC: 34.3 g/dL (ref 30.0–36.0)
MCV: 87.6 fl (ref 78.0–100.0)
MONO ABS: 0.7 10*3/uL (ref 0.1–1.0)
Monocytes Relative: 6.4 % (ref 3.0–12.0)
NEUTROS ABS: 7 10*3/uL (ref 1.4–7.7)
NEUTROS PCT: 68 % (ref 43.0–77.0)
PLATELETS: 298 10*3/uL (ref 150.0–400.0)
RBC: 4.51 Mil/uL (ref 3.87–5.11)
RDW: 13.5 % (ref 11.5–15.5)
WBC: 10.3 10*3/uL (ref 4.0–10.5)

## 2017-03-14 LAB — LIPID PANEL
CHOL/HDL RATIO: 4
CHOLESTEROL: 193 mg/dL (ref 0–200)
HDL: 43.4 mg/dL (ref >39.00)
NONHDL: 150.03
Triglycerides: 212 mg/dL — ABNORMAL HIGH (ref 0.0–149.0)
VLDL: 42.4 mg/dL — ABNORMAL HIGH (ref 0.0–40.0)

## 2017-03-14 LAB — BASIC METABOLIC PANEL
BUN: 22 mg/dL (ref 6–23)
CHLORIDE: 102 meq/L (ref 96–112)
CO2: 29 meq/L (ref 19–32)
CREATININE: 1.09 mg/dL (ref 0.40–1.20)
Calcium: 9.7 mg/dL (ref 8.4–10.5)
GFR: 54 mL/min — ABNORMAL LOW (ref >60.00)
Glucose, Bld: 93 mg/dL (ref 70–99)
Potassium: 4 mEq/L (ref 3.5–5.1)
Sodium: 138 mEq/L (ref 135–145)

## 2017-03-14 LAB — SEDIMENTATION RATE

## 2017-03-14 LAB — LDL CHOLESTEROL, DIRECT: Direct LDL: 123 mg/dL

## 2017-03-14 LAB — VITAMIN D 25 HYDROXY (VIT D DEFICIENCY, FRACTURES): VITD: 38.94 ng/mL (ref 30.00–100.00)

## 2017-03-14 LAB — TSH: TSH: 1.69 u[IU]/mL (ref 0.35–4.50)

## 2017-03-14 NOTE — Patient Instructions (Signed)
Set up repeat mammogram Consider new shingles vaccine (Shingrix) and check on insurance coverage.

## 2017-03-14 NOTE — Addendum Note (Signed)
Addended by: Agnes Lawrence on: 03/14/2017 09:51 AM   Modules accepted: Orders

## 2017-03-14 NOTE — Progress Notes (Signed)
Subjective:     Patient ID: Laurie [name], female   DOB: 12/16/1955, 62 y.o.   MRN: [id number]  HPI Patient seen for physical exam. She had hysterectomy several years ago. She's not had a mammogram in several years and has declined in the past but is willing to reconsider. She's not had flu vaccine yet. She's not had shingles vaccine. Did not start smoking until around age 65. Less than 30 pack year history. No history of hepatitis C screening. Colonoscopy up-to-date but will need repeat by next year  She has history of squamous cell cancer of the face but has not seen dermatologist in over a year.  She states that her "whole body hurts ". She's had multiple neck surgeries. She has pains though diffusely involving multiple joints. She states she has family history of rheumatoid arthritis and wants to rule out out. No recent new skin rashes.  Past Medical History:  Diagnosis Date  . Allergy   . Anxiety   . Arthritis    RA  . Asthma   . Chronic back pain   . Depression   . Esophageal stricture   . GERD (gastroesophageal reflux disease)   . Heart murmur   . Hyperlipidemia   . Hypertension   . IBS (irritable bowel syndrome)   . Interstitial cystitis   . Neuromuscular disorder ([place])    fibromyalgia   Past Surgical History:  Procedure Laterality Date  . ABDOMINAL HYSTERECTOMY    . APPENDECTOMY    . CERVICAL FUSION    . CERVICAL LAMINECTOMY    . ESOPHAGOGASTRODUODENOSCOPY    . FINGER SURGERY    . LUMBAR FUSION    . TONSILLECTOMY      reports that she has been smoking cigarettes.  She has been smoking about 0.50 packs per day. she has never used smokeless tobacco. She reports that she does not drink alcohol or use drugs. family history includes Aneurysm in her brother; Cancer in her brother; Coronary artery disease in her father, paternal aunt, and paternal grandmother; Dementia in her mother; Esophageal cancer in her brother; Heart attack in her father; Stomach cancer in her  paternal aunt. Allergies  Allergen Reactions  . Codeine     REACTION: Vomiting  . Lisinopril Cough  . Sulfonamide Derivatives     REACTION: Upset GI     Review of Systems  Constitutional: Positive for fatigue. Negative for activity change, appetite change, fever and unexpected weight change.  HENT: Negative for ear pain, hearing loss, sore throat and trouble swallowing.   Eyes: Negative for visual disturbance.  Respiratory: Negative for cough and shortness of breath.   Cardiovascular: Negative for chest pain, palpitations and leg swelling.  Gastrointestinal: Negative for abdominal pain, blood in stool, constipation, diarrhea and vomiting.  Genitourinary: Negative for dysuria and hematuria.  Musculoskeletal: Positive for arthralgias, back pain, neck pain and neck stiffness. Negative for myalgias.  Skin: Negative for rash.  Neurological: Negative for dizziness, syncope and headaches.  Hematological: Negative for adenopathy.  Psychiatric/Behavioral: Negative for confusion and dysphoric mood.       Objective:   Physical Exam  Constitutional: She is oriented to person, place, and time. She appears well-developed and well-nourished.  HENT:  Right Ear: External ear normal.  Left Ear: External ear normal.  Mouth/Throat: Oropharynx is clear and moist. No oropharyngeal exudate.  Neck: Neck supple. No thyromegaly present.  Cardiovascular: Normal rate and regular rhythm.  Pulmonary/Chest: Effort normal and breath sounds normal. No respiratory distress. She has  no wheezes. She has no rales.  Abdominal: Soft. There is no tenderness. There is no rebound and no guarding.  Musculoskeletal: She exhibits no edema.  Lymphadenopathy:    She has no cervical adenopathy.  Neurological: She is alert and oriented to person, place, and time. No cranial nerve deficit.  Skin: No rash noted.  Psychiatric: She has a normal mood and affect. Her behavior is normal.       Assessment:     #1 Physical  exam. Several health maintenance issues addressed as below  #2 polyarthralgias. She has some known osteoarthritis. She is concerned about inflammatory arthritis    Plan:     -Check labs with lipid panel, hepatic panel, basic metabolic panel, TSH, CBC -Check hepatitis C antibody -Repeat colonoscopy by next year -Flu vaccine given -Set up repeat mammogram -Check on coverage for new shingles vaccine -Patient does not meet criteria for low-dose lung cancer screening by CT based on less than 30 pack year history -Encouraged to continue with yearly dermatology follow-up -Obtain screening labs for inflammatory arthritis including sedimentation rate, ANA, rheumatoid factor, CCP antibody  [name] MD  Primary Care at [facility]

## 2017-03-16 ENCOUNTER — Other Ambulatory Visit: Payer: Self-pay | Admitting: Family Medicine

## 2017-03-17 ENCOUNTER — Telehealth: Payer: Self-pay | Admitting: Family Medicine

## 2017-03-17 LAB — ANTI-NUCLEAR AB-TITER (ANA TITER): ANA Titer 1: 1:160 {titer} — ABNORMAL HIGH

## 2017-03-17 LAB — HEPATITIS C ANTIBODY
HEP C AB: NONREACTIVE
SIGNAL TO CUT-OFF: 0.09 (ref <1.00)

## 2017-03-17 LAB — ANA: ANA: POSITIVE — AB

## 2017-03-17 LAB — CYCLIC CITRUL PEPTIDE ANTIBODY, IGG

## 2017-03-17 LAB — RHEUMATOID FACTOR: Rhuematoid fact SerPl-aCnc: <14 IU/mL (ref <14)

## 2017-03-17 NOTE — Telephone Encounter (Signed)
Pt called for her lab results. She is not using the MyChart any longer.  I gave her Dr. Erick Blinks results note : Lipids improved. Rheumatoid factor negative. Other inflammatory markers pending. She would like a call back regarding her other labs when they come in.

## 2017-03-19 ENCOUNTER — Telehealth: Payer: Self-pay | Admitting: Family Medicine

## 2017-03-19 NOTE — Telephone Encounter (Signed)
Left message on machine for patient to return our call 

## 2017-03-19 NOTE — Telephone Encounter (Signed)
Copied from [place]. Topic: Quick Communication - Lab Results >> Mar 19, 2017  1:15 PM Robina Ade, Helene Kelp D wrote: Patient called and would like to know if the rest of her labs have came in and if so she would like those results. Please call patient back, thanks.

## 2017-03-20 ENCOUNTER — Telehealth: Payer: Self-pay | Admitting: Family Medicine

## 2017-03-20 NOTE — Telephone Encounter (Signed)
Pt given results per notes of Dr. Elease Hashimoto on 03/15/17 and 03/17/17, patient verbalized understanding, she says I'll wait to hear what the ANA antibody is. Unable to document in result note due to result note not routed to [facility].

## 2017-03-25 ENCOUNTER — Telehealth: Payer: Self-pay | Admitting: Family Medicine

## 2017-03-25 NOTE — Telephone Encounter (Signed)
Left message on machine for patient to return our call.  CRM Created 

## 2017-03-25 NOTE — Telephone Encounter (Signed)
Given her hypertension hx, would be OK to take OTC Coricidin.

## 2017-03-25 NOTE — Telephone Encounter (Signed)
Copied from [place]. Topic: Quick Communication - See Telephone Encounter >> Mar 25, 2017 12:41 PM Hewitt Shorts wrote: CRM for notification. See Telephone encounter for: pt is requesting something for congestion to be called in at [facility] [place] [phone]  Best number [phone]  03/25/17.

## 2017-03-25 NOTE — Telephone Encounter (Signed)
Patient returned call, message given per note Dr. Elease Hashimoto 03/25/17, she verbalized understanding.

## 2017-03-26 DIAGNOSIS — M5412 Radiculopathy, cervical region: Secondary | ICD-10-CM | POA: Diagnosis not present

## 2017-03-26 DIAGNOSIS — I10 Essential (primary) hypertension: Secondary | ICD-10-CM | POA: Diagnosis not present

## 2017-03-26 DIAGNOSIS — M542 Cervicalgia: Secondary | ICD-10-CM | POA: Diagnosis not present

## 2017-04-01 ENCOUNTER — Other Ambulatory Visit: Payer: Self-pay | Admitting: Neurological Surgery

## 2017-04-01 DIAGNOSIS — M5412 Radiculopathy, cervical region: Secondary | ICD-10-CM

## 2017-04-04 ENCOUNTER — Ambulatory Visit (INDEPENDENT_AMBULATORY_CARE_PROVIDER_SITE_OTHER): Payer: Medicare Other | Admitting: Family Medicine

## 2017-04-04 ENCOUNTER — Encounter: Payer: Self-pay | Admitting: Family Medicine

## 2017-04-04 VITALS — BP 130/80 | HR 71 | Temp 98.2°F | Wt 162.7 lb

## 2017-04-04 DIAGNOSIS — E785 Hyperlipidemia, unspecified: Secondary | ICD-10-CM

## 2017-04-04 DIAGNOSIS — I10 Essential (primary) hypertension: Secondary | ICD-10-CM | POA: Diagnosis not present

## 2017-04-04 DIAGNOSIS — J0100 Acute maxillary sinusitis, unspecified: Secondary | ICD-10-CM | POA: Diagnosis not present

## 2017-04-04 MED ORDER — AMOXICILLIN-POT CLAVULANATE 875-125 MG PO TABS: 1.0000 | ORAL_TABLET | Freq: Two times a day (BID) | ORAL | 0 refills | Status: DC

## 2017-04-04 NOTE — Patient Instructions (Signed)

## 2017-04-04 NOTE — Progress Notes (Signed)
Subjective:     Patient ID: Laurie [name], female   DOB: [birth year], 62 y.o.   MRN: [id number]  HPI Patient seen with several week history of persistent sinus symptoms. She states she's had these ever since her physical. She's had some right maxillary facial pain and occasional bloody discharge. Increased pressure and occasional headaches. Occasional dry cough. Increased malaise. No relief with over-the-counter medications.  Patient has hyperlipidemia on Crestor 20 mg daily. She had previous CAD with minimal nonobstructive coronary artery disease. Recent LDL cholesterol 123. We discussed possible titration  Hypertension on losartan HCTZ. No recent chest pains. Compliant with medication. No side effects.  Past Medical History:  Diagnosis Date  . Allergy   . Anxiety   . Arthritis    RA  . Asthma   . Chronic back pain   . Depression   . Esophageal stricture   . GERD (gastroesophageal reflux disease)   . Heart murmur   . Hyperlipidemia   . Hypertension   . IBS (irritable bowel syndrome)   . Interstitial cystitis   . Neuromuscular disorder ([place])    fibromyalgia   Past Surgical History:  Procedure Laterality Date  . ABDOMINAL HYSTERECTOMY    . APPENDECTOMY    . CERVICAL FUSION    . CERVICAL LAMINECTOMY    . ESOPHAGOGASTRODUODENOSCOPY    . FINGER SURGERY    . LUMBAR FUSION    . TONSILLECTOMY      reports that she has been smoking cigarettes.  She has been smoking about 0.50 packs per day. she has never used smokeless tobacco. She reports that she does not drink alcohol or use drugs. family history includes Aneurysm in her brother; Cancer in her brother; Coronary artery disease in her father, paternal aunt, and paternal grandmother; Dementia in her mother; Esophageal cancer in her brother; Heart attack in her father; Stomach cancer in her paternal aunt. Allergies  Allergen Reactions  . Codeine     REACTION: Vomiting  . Lisinopril Cough  . Sulfonamide Derivatives    REACTION: Upset GI     Review of Systems  Constitutional: Positive for fatigue.  HENT: Positive for congestion, sinus pressure and sinus pain.   Eyes: Negative for visual disturbance.  Respiratory: Positive for cough. Negative for chest tightness, shortness of breath and wheezing.   Cardiovascular: Negative for chest pain, palpitations and leg swelling.  Neurological: Positive for headaches. Negative for dizziness, seizures, syncope, weakness and light-headedness.       Objective:   Physical Exam  Constitutional: She appears well-developed and well-nourished.  HENT:  Right Ear: External ear normal.  Left Ear: External ear normal.  Mouth/Throat: Oropharynx is clear and moist.  Neck: Neck supple.  Cardiovascular: Normal rate and regular rhythm.  Pulmonary/Chest: Effort normal and breath sounds normal. No respiratory distress. She has no wheezes. She has no rales.  Lymphadenopathy:    She has no cervical adenopathy.       Assessment:     #1 probable acute right maxillary sinusitis  #2 history of CAD with nonobstructive coronaries  #3 hyperlipidemia  #4 hypertension stable and at goal    Plan:     -Start Augmentin 875 mg twice daily with food -Increase Crestor to 40 mg daily and reassess lipids in about 2 months  [name] MD [facility]

## 2017-04-12 ENCOUNTER — Ambulatory Visit
Admission: RE | Admit: 2017-04-12 | Discharge: 2017-04-12 | Disposition: A | Discharge status: Home / Self Care | Payer: Medicare Other | Source: Ambulatory Visit | Attending: Neurological Surgery | Admitting: Neurological Surgery

## 2017-04-12 DIAGNOSIS — M4802 Spinal stenosis, cervical region: Secondary | ICD-10-CM | POA: Diagnosis not present

## 2017-04-12 DIAGNOSIS — M5412 Radiculopathy, cervical region: Secondary | ICD-10-CM

## 2017-04-15 ENCOUNTER — Other Ambulatory Visit: Payer: Self-pay | Admitting: Family Medicine

## 2017-04-16 DIAGNOSIS — M5412 Radiculopathy, cervical region: Secondary | ICD-10-CM | POA: Diagnosis not present

## 2017-04-16 DIAGNOSIS — I10 Essential (primary) hypertension: Secondary | ICD-10-CM | POA: Diagnosis not present

## 2017-05-19 ENCOUNTER — Ambulatory Visit: Payer: Self-pay

## 2017-05-19 NOTE — Telephone Encounter (Signed)
Patient called in with c/o "chest pain, difficulty breathing."  She says "it started about 4 days ago and has gotten worse. I can't catch my breath when I lay down or do things. I have a deep cough. The pain is in my left breast all the way through to my back, it has gone down my left arm.  At night, I wake up sweating and when I do things I sweat. Now I have this deep cough and chest congestion. I don't know if it's my heart or my lungs. The pain is a 8." According to protocol, go to ED. I advised the patient to go to the ED or I can call 911. She says "thank you, but I will find me a ride. I am only 2 minutes from the office, so I was hoping I can come in to have a stress test or something." I advised with her history of heart blockages and the chest pain with all the symptoms, she will be better off going to the ED, preferably [facility], because they can do the cardiac labs and if elevated, they can evaluate it there being scheduling a stress test or evaluate the blockages. I also advised they can do the chest x-ray to evaluate her lungs. She says "I just don't want to go sit in the ED all day for them to tell me it's not my heart. I don't drive and will have to find a way, but I don't know." I offered again to call EMS, she refused. I advised this would be sent to Dr. Elease Hashimoto for review.   Reason for Disposition . SEVERE chest pain  Answer Assessment - Initial Assessment Questions 1. LOCATION: "Where does it hurt?"       Left breast, through 2. RADIATION: "Does the pain go anywhere else?" (e.g., into neck, jaw, arms, back)     Left back, down left arm 3. ONSET: "When did the chest pain begin?" (Minutes, hours or days)      4 days ago constant, gotten worse 4. PATTERN "Does the pain come and go, or has it been constant since it started?"  "Does it get worse with exertion?"      Constant 5. DURATION: "How long does it last" (e.g., seconds, minutes, hours)     Constant since it started 6.  SEVERITY: "How bad is the pain?"  (e.g., Scale 1-10; mild, moderate, or severe)    - MILD (1-3): doesn't interfere with normal activities     - MODERATE (4-7): interferes with normal activities or awakens from sleep    - SEVERE (8-10): excruciating pain, unable to do any normal activities       8 7. CARDIAC RISK FACTORS: "Do you have any history of heart problems or risk factors for heart disease?" (e.g., prior heart attack, angina; high blood pressure, diabetes, being overweight, high cholesterol, smoking, or strong family history of heart disease)    Heart Blockages, HTN, High cholesterol, smoking 8. PULMONARY RISK FACTORS: "Do you have any history of lung disease?"  (e.g., blood clots in lung, asthma, emphysema, birth control pills)     No 9. CAUSE: "What do you think is causing the chest pain?"     My heart or lungs 10. OTHER SYMPTOMS: "Do you have any other symptoms?" (e.g., dizziness, nausea, vomiting, sweating, fever, difficulty breathing, cough)       Difficulty breathing, cough 11. PREGNANCY: "Is there any chance you are pregnant?" "When was your last menstrual period?"  No  Protocols used: CHEST PAIN-A-AH

## 2017-05-19 NOTE — Telephone Encounter (Signed)
Spoke with patient and she is going to go to an Urgent Care.

## 2017-05-19 NOTE — Telephone Encounter (Signed)
Left message on machine for patient to return our call 

## 2017-05-20 ENCOUNTER — Encounter: Payer: Self-pay | Admitting: Adult Health

## 2017-05-20 ENCOUNTER — Ambulatory Visit (INDEPENDENT_AMBULATORY_CARE_PROVIDER_SITE_OTHER): Payer: Medicare Other | Admitting: Adult Health

## 2017-05-20 VITALS — BP 116/70 | HR 92 | Temp 98.4°F | Wt 156.0 lb

## 2017-05-20 DIAGNOSIS — J069 Acute upper respiratory infection, unspecified: Secondary | ICD-10-CM | POA: Diagnosis not present

## 2017-05-20 DIAGNOSIS — R071 Chest pain on breathing: Secondary | ICD-10-CM

## 2017-05-20 MED ORDER — ALBUTEROL SULFATE HFA 108 (90 BASE) MCG/ACT IN AERS: 2.0000 | INHALATION_SPRAY | Freq: Four times a day (QID) | RESPIRATORY_TRACT | 0 refills | Status: DC | PRN

## 2017-05-20 MED ORDER — LEVOFLOXACIN 750 MG PO TABS: 750.0000 mg | ORAL_TABLET | Freq: Every day | ORAL | 0 refills | Status: DC

## 2017-05-20 NOTE — Progress Notes (Signed)
Subjective:    Patient ID: Laurie [name], female    DOB: 10-18-1955, 62 y.o.   MRN: [id number]  URI   This is a new problem. The current episode started in the past 7 days (4). There has been no fever. Associated symptoms include chest pain, congestion, coughing, rhinorrhea and wheezing. Pertinent negatives include no diarrhea, ear pain, nausea, plugged ear sensation, sinus pain or sore throat. The treatment provided no relief.    Review of Systems  Constitutional: Positive for activity change, chills, diaphoresis and fatigue. Negative for fever.  HENT: Positive for congestion and rhinorrhea. Negative for ear discharge, ear pain, sinus pressure, sinus pain and sore throat.   Eyes: Negative.   Respiratory: Positive for cough, chest tightness, shortness of breath and wheezing.   Cardiovascular: Positive for chest pain.  Gastrointestinal: Negative for diarrhea and nausea.  Neurological: Negative.    Past Medical History:  Diagnosis Date  . Allergy   . Anxiety   . Arthritis    RA  . Asthma   . Chronic back pain   . Depression   . Esophageal stricture   . GERD (gastroesophageal reflux disease)   . Heart murmur   . Hyperlipidemia   . Hypertension   . IBS (irritable bowel syndrome)   . Interstitial cystitis   . Neuromuscular disorder ([place])    fibromyalgia    Social History   Socioeconomic History  . Marital status: Single    Spouse name: Not on file  . Number of children: Not on file  . Years of education: Not on file  . Highest education level: Not on file  Occupational History  . Not on file  Social Needs  . Financial resource strain: Not on file  . Food insecurity:    Worry: Not on file    Inability: Not on file  . Transportation needs:    Medical: Not on file    Non-medical: Not on file  Tobacco Use  . Smoking status: Current Every Day Smoker    Packs/day: 0.50    Types: Cigarettes  . Smokeless tobacco: Never Used  . Tobacco comment: trying to quit     Substance and Sexual Activity  . Alcohol use: No    Alcohol/week: 0.0 oz  . Drug use: No  . Sexual activity: Not on file  Lifestyle  . Physical activity:    Days per week: Not on file    Minutes per session: Not on file  . Stress: Not on file  Relationships  . Social connections:    Talks on phone: Not on file    Gets together: Not on file    Attends religious service: Not on file    Active member of club or organization: Not on file    Attends meetings of clubs or organizations: Not on file    Relationship status: Not on file  . Intimate partner violence:    Fear of current or ex partner: Not on file    Emotionally abused: Not on file    Physically abused: Not on file    Forced sexual activity: Not on file  Other Topics Concern  . Not on file  Social History Narrative  . Not on file    Past Surgical History:  Procedure Laterality Date  . ABDOMINAL HYSTERECTOMY    . APPENDECTOMY    . CERVICAL FUSION    . CERVICAL LAMINECTOMY    . ESOPHAGOGASTRODUODENOSCOPY    . FINGER SURGERY    .  LUMBAR FUSION    . TONSILLECTOMY      Family History  Problem Relation Age of Onset  . Dementia Mother   . Heart attack Father   . Coronary artery disease Father   . Cancer Brother        esophageal  . Esophageal cancer Brother   . Coronary artery disease Paternal Aunt   . Stomach cancer Paternal Aunt   . Coronary artery disease Paternal Grandmother   . Aneurysm Brother        aortic  . Rectal cancer Neg Hx   . Colon cancer Neg Hx     Allergies  Allergen Reactions  . Codeine     REACTION: Vomiting  . Lisinopril Cough  . Sulfonamide Derivatives     REACTION: Upset GI  . Tetracyclines & Related Rash    Current Outpatient Medications on File Prior to Visit  Medication Sig Dispense Refill  . Cholecalciferol (DIALYVITE VITAMIN D3 MAX) 31517 units TABS Take 1 tablet by mouth once a week. 12 tablet 3  . losartan-hydrochlorothiazide (HYZAAR) 100-12.5 MG tablet TAKE ONE TABLET  BY MOUTH DAILY 90 tablet 2  . oxyCODONE-acetaminophen (PERCOCET/ROXICET) 5-325 MG tablet Take 1-2 tablets by mouth every 6 (six) hours as needed for severe pain. 30 tablet 0  . pantoprazole (PROTONIX) 40 MG tablet Take 1 tablet (40 mg total) by mouth 2 (two) times daily. Take 1 tablet 2 times daily 60 tablet 6  . rosuvastatin (CRESTOR) 40 MG tablet Take 40 mg by mouth daily.    Marland Kitchen venlafaxine XR (EFFEXOR-XR) 150 MG 24 hr capsule TAKE ONE CAPSULE BY MOUTH DAILY 30 capsule 5   No current facility-administered medications on file prior to visit.     BP 116/70   Pulse 92   Temp 98.4 F (36.9 C) (Oral)   Wt 156 lb (70.8 kg)   SpO2 98%   BMI 22.54 kg/m       Objective:   Physical Exam  Constitutional: She is oriented to person, place, and time. She appears well-developed and well-nourished. No distress.  HENT:  Right Ear: Hearing, tympanic membrane, external ear and ear canal normal.  Left Ear: Hearing, tympanic membrane, external ear and ear canal normal.  Nose: No mucosal edema or rhinorrhea. Right sinus exhibits no maxillary sinus tenderness and no frontal sinus tenderness. Left sinus exhibits no maxillary sinus tenderness and no frontal sinus tenderness.  Neck: Normal range of motion. Neck supple.  Cardiovascular: Normal rate, regular rhythm, normal heart sounds and intact distal pulses. Exam reveals no gallop and no friction rub.  No murmur heard. Pulmonary/Chest: Effort normal. No respiratory distress. She has wheezes (diffuse). She has rhonchi (diffuse). She has no rales. She exhibits no tenderness.  Lymphadenopathy:    She has no cervical adenopathy.  Neurological: She is alert and oriented to person, place, and time.  Skin: Skin is warm and dry. No rash noted. She is not diaphoretic. No erythema. No pallor.  Psychiatric: She has a normal mood and affect. Her behavior is normal. Judgment and thought content normal.  Nursing note and vitals reviewed.     Assessment & Plan:  1.  Chest pain on breathing  - EKG 12-Lead- Sinus  Rhythm  -ST depression   +   Negative T-waves  -Nondiagnostic -possible digitalis effect, -consider subendocardial injury/ischemia  -Possible  Anterior  ischemia. Rate 80. Consistent with previous EKG's. Advised to go the ER with worsening chest pain   2. Upper respiratory tract infection, unspecified type -  High concern for pneumonia. Will start treatment. She is allergic to Tetracycyline. WIll start on Levaquin. Get chest xray tomorrow morning.  - levofloxacin (LEVAQUIN) 750 MG tablet; Take 1 tablet (750 mg total) by mouth daily.  Dispense: 7 tablet; Refill: 0 - DG Chest 2 View; Future - albuterol (PROVENTIL HFA;VENTOLIN HFA) 108 (90 Base) MCG/ACT inhaler; Inhale 2 puffs into the lungs every 6 (six) hours as needed for wheezing or shortness of breath.  Dispense: 1 Inhaler; Refill: 0 - Encouraged to quit smoking  - Follow up in 2-3 days if no improvement   [name], NP

## 2017-06-18 ENCOUNTER — Other Ambulatory Visit: Payer: Self-pay | Admitting: Family Medicine

## 2017-07-17 ENCOUNTER — Ambulatory Visit: Payer: Self-pay | Admitting: *Deleted

## 2017-07-17 NOTE — Telephone Encounter (Signed)
Pt reports symptoms of low abd bladder discomfort - dysuria  Temp of 99  . Patient reports symptoms began 4 days ago. Appointment made for tomorrow with Dr Jarold Song.Patient given advice until appointment time     Reason for Disposition . Urinating more frequently than usual (i.e., frequency)  Answer Assessment - Initial Assessment Questions 1. SYMPTOM: "What's the main symptom you're concerned about?" (e.g., frequency, incontinence)     Low abd cramping nauseated tired frequent urination   2. ONSET: "When did the  ________  start?"       4 days ago  3. PAIN: "Is there any pain?" If so, ask: "How bad is it?" (Scale: 1-10; mild, moderate, severe)         7   4. CAUSE: "What do you think is causing the symptoms?"      Possibly a uti   5. OTHER SYMPTOMS: "Do you have any other symptoms?" (e.g., fever, flank pain, blood in urine, pain with urination)      Temp  99  Dysuria - bladder cramping  Low back pain pain r flank  Specks of blood in urine   6. PREGNANCY: "Is there any chance you are pregnant?" "When was your last menstrual period?" n/a  Protocols used: URINARY [facility]

## 2017-07-18 ENCOUNTER — Ambulatory Visit (INDEPENDENT_AMBULATORY_CARE_PROVIDER_SITE_OTHER): Payer: Medicare Other | Admitting: Family Medicine

## 2017-07-18 ENCOUNTER — Encounter: Payer: Self-pay | Admitting: Family Medicine

## 2017-07-18 VITALS — BP 120/80 | HR 75 | Temp 98.1°F | Wt 158.6 lb

## 2017-07-18 DIAGNOSIS — L851 Acquired keratosis [keratoderma] palmaris et plantaris: Secondary | ICD-10-CM

## 2017-07-18 DIAGNOSIS — R1030 Lower abdominal pain, unspecified: Secondary | ICD-10-CM

## 2017-07-18 DIAGNOSIS — R1314 Dysphagia, pharyngoesophageal phase: Secondary | ICD-10-CM

## 2017-07-18 LAB — POCT URINALYSIS DIPSTICK
Bilirubin, UA: NEGATIVE
Glucose, UA: NEGATIVE
Ketones, UA: NEGATIVE
Leukocytes, UA: NEGATIVE
NITRITE UA: NEGATIVE
PH UA: 5 (ref 5.0–8.0)
PROTEIN UA: NEGATIVE
Spec Grav, UA: 1.01 (ref 1.010–1.025)
UROBILINOGEN UA: 0.2 U/dL

## 2017-07-18 MED ORDER — AMITRIPTYLINE HCL 10 MG PO TABS: 10.0000 mg | ORAL_TABLET | Freq: Every day | ORAL | 1 refills | Status: DC

## 2017-07-18 MED ORDER — PANTOPRAZOLE SODIUM 40 MG PO TBEC: 40.0000 mg | DELAYED_RELEASE_TABLET | Freq: Two times a day (BID) | ORAL | 6 refills | Status: DC

## 2017-07-18 NOTE — Patient Instructions (Signed)
Interstitial Cystitis Interstitial cystitis is a condition that causes inflammation of the bladder. The bladder is a hollow organ in the lower part of your abdomen. It stores urine after the urine is made by your kidneys. With interstitial cystitis, you may have pain in the bladder area. You may also have a frequent and urgent need to urinate. The severity of interstitial cystitis can vary from person to person. You may have flare-ups of the condition, and then it may go away for a while. For many people who have this condition, it becomes a long-term problem. What are the causes? The cause of this condition is not known. What increases the risk? This condition is more likely to develop in women. What are the signs or symptoms? Symptoms of interstitial cystitis vary, and they can change over time. Symptoms may include:  Discomfort or pain in the bladder area. This can range from mild to severe. The pain may change in intensity as the bladder fills with urine or as it empties.  Pelvic pain.  An urgent need to urinate.  Frequent urination.  Pain during sexual intercourse.  Pinpoint bleeding on the bladder wall.  For women, the symptoms often get worse during menstruation. How is this diagnosed? This condition is diagnosed by evaluating your symptoms and ruling out other causes. A physical exam will be done. Various tests may be done to rule out other conditions. Common tests include:  Urine tests.  Cystoscopy. In this test, a tool that is like a very thin telescope is used to look into your bladder.  Biopsy. This involves taking a sample of tissue from the bladder wall to be examined under a microscope.  How is this treated? There is no cure for interstitial cystitis, but treatment methods are available to control your symptoms. Work closely with your health care provider to find the treatments that will be most effective for you. Treatment options may include:  Medicines to relieve  pain and to help reduce the number of times that you feel the need to urinate.  Bladder training. This involves learning ways to control when you urinate, such as: ? Urinating at scheduled times. ? Training yourself to delay urination. ? Doing exercises (Kegel exercises) to strengthen the muscles that control urine flow.  Lifestyle changes, such as changing your diet or taking steps to control stress.  Use of a device that provides electrical stimulation in order to reduce pain.  A procedure that stretches your bladder by filling it with air or fluid.  Surgery. This is rare. It is only done for extreme cases if other treatments do not help.  Follow these instructions at home:  Take medicines only as directed by your health care provider.  Use bladder training techniques as directed. ? Keep a bladder diary to find out which foods, liquids, or activities make your symptoms worse. ? Use your bladder diary to schedule bathroom trips. If you are away from home, plan to be near a bathroom at each of your scheduled times. ? Make sure you urinate just before you leave the house and just before you go to bed.  Do Kegel exercises as directed by your health care provider.  Do not drink alcohol.  Do not use any tobacco products, including cigarettes, chewing tobacco, or electronic cigarettes. If you need help quitting, ask your health care provider.  Make dietary changes as directed by your health care provider. You may need to avoid spicy foods and foods that contain a high amount   of potassium.  Limit your drinking of beverages that stimulate urination. These include soda, coffee, and tea.  Keep all follow-up visits as directed by your health care provider. This is important. Contact a health care provider if:  Your symptoms do not get better after treatment.  Your pain and discomfort are getting worse.  You have more frequent urges to urinate.  You have a fever. Get help right away  if:  You are not able to control your bladder at all. This information is not intended to replace advice given to you by your health care provider. Make sure you discuss any questions you have with your health care provider. Document Released: 09/29/2003 Document Revised: 07/06/2015 Document Reviewed: 10/05/2013 Elsevier Interactive Patient Education  2018 [employer]  

## 2017-07-18 NOTE — Progress Notes (Signed)
Subjective:     Patient ID: Laurie [name], female   DOB: [birth year], 62 y.o.   MRN: [id number]  HPI Patient seen for several issues as follows  She's had several days of lower abdominal suprapubic cramp-like pains. She had similar pains in the past with interstitial cystitis flare up. She's not taking any medications for IC at this time. She denied any gross hematuria. No fever. No chills. No flank pain. No burning with urination. Occasional urine frequency -especially at night.  Patient has history of GERD and esophageal stricture. She's had about 2 months of progressive solid food dysphagia and occasional pain with swallowing. She is concerned because one of her brothers died of esophageal cancer. Patient has good appetite. Weight relatively stable. She ran out of Protonix recently and has not been on this now for several months  Past Medical History:  Diagnosis Date  . Allergy   . Anxiety   . Arthritis    RA  . Asthma   . Chronic back pain   . Depression   . Esophageal stricture   . GERD (gastroesophageal reflux disease)   . Heart murmur   . Hyperlipidemia   . Hypertension   . IBS (irritable bowel syndrome)   . Interstitial cystitis   . Neuromuscular disorder ([place])    fibromyalgia   Past Surgical History:  Procedure Laterality Date  . ABDOMINAL HYSTERECTOMY    . APPENDECTOMY    . CERVICAL FUSION    . CERVICAL LAMINECTOMY    . ESOPHAGOGASTRODUODENOSCOPY    . FINGER SURGERY    . LUMBAR FUSION    . TONSILLECTOMY      reports that she has been smoking cigarettes.  She has been smoking about 0.50 packs per day. She has never used smokeless tobacco. She reports that she does not drink alcohol or use drugs. family history includes Aneurysm in her brother; Cancer in her brother; Coronary artery disease in her father, paternal aunt, and paternal grandmother; Dementia in her mother; Esophageal cancer in her brother; Heart attack in her father; Stomach cancer in her paternal  aunt. Allergies  Allergen Reactions  . Codeine     REACTION: Vomiting  . Lisinopril Cough  . Sulfonamide Derivatives     REACTION: Upset GI  . Tetracyclines & Related Rash     Review of Systems  Constitutional: Negative for chills and fever.  HENT: Positive for trouble swallowing. Negative for congestion.   Respiratory: Negative for shortness of breath.   Cardiovascular: Negative for chest pain.  Genitourinary: Positive for frequency and urgency. Negative for difficulty urinating, hematuria, vaginal bleeding and vaginal discharge.       Objective:   Physical Exam  Constitutional: She appears well-developed and well-nourished.  Cardiovascular: Normal rate and regular rhythm.  Pulmonary/Chest: Effort normal and breath sounds normal.  Abdominal: Normal appearance and normal aorta. There is no tenderness.  Skin:  Small eschar mid anterior chest about 4 mm diameter. Patient states this has been present for past couple weeks. She felt this was ingrown hair and tried to remove with tweezers       Assessment:     #1 suprapubic discomfort. Question flare of interstitial cystitis. Urine dipstick reveals only trace blood otherwise negative. Doubt UTI  #2 recurrent solid food dysphagia. Past history of GERD. Patient recently ran out of Protonix    Plan:     -Refill Protonix 40 mg twice daily and get back in to see GI. She will need to establish with new  provider -We discussed options for interstitial cystitis. She was on medications previously but she cannot afford. We agree to trial of low-dose amitriptyline 10 mg  at bedtime -Set up follow-up to address multiple scaly skin lesions she has including upper back and anterior chest. Consider biopsy of anterior chest lesion if not fully healed in 3 weeks  [name] MD [facility]

## 2017-07-19 LAB — URINE CULTURE
MICRO NUMBER:: 90687094
SPECIMEN QUALITY:: ADEQUATE

## 2017-07-22 ENCOUNTER — Ambulatory Visit: Payer: Medicare Other | Admitting: Gastroenterology

## 2017-09-03 ENCOUNTER — Ambulatory Visit: Payer: Medicare Other | Admitting: Gastroenterology

## 2017-09-08 ENCOUNTER — Encounter: Payer: Self-pay | Admitting: Family Medicine

## 2017-11-05 ENCOUNTER — Other Ambulatory Visit: Payer: Self-pay

## 2017-11-05 ENCOUNTER — Encounter (HOSPITAL_COMMUNITY): Payer: Self-pay | Admitting: Emergency Medicine

## 2017-11-05 ENCOUNTER — Ambulatory Visit (INDEPENDENT_AMBULATORY_CARE_PROVIDER_SITE_OTHER): Payer: Medicare Other | Admitting: Family Medicine

## 2017-11-05 ENCOUNTER — Encounter: Payer: Self-pay | Admitting: Family Medicine

## 2017-11-05 ENCOUNTER — Emergency Department (HOSPITAL_COMMUNITY): Payer: Medicare Other

## 2017-11-05 ENCOUNTER — Ambulatory Visit: Payer: Self-pay

## 2017-11-05 ENCOUNTER — Observation Stay (HOSPITAL_COMMUNITY)
Admission: EM | Admit: 2017-11-05 | Discharge: 2017-11-06 | Disposition: A | Discharge status: Home / Self Care | Payer: Medicare Other | Attending: Internal Medicine | Admitting: Internal Medicine

## 2017-11-05 VITALS — BP 104/56 | HR 95 | Temp 98.9°F | Wt 155.4 lb

## 2017-11-05 DIAGNOSIS — Z882 Allergy status to sulfonamides status: Secondary | ICD-10-CM | POA: Diagnosis not present

## 2017-11-05 DIAGNOSIS — K589 Irritable bowel syndrome without diarrhea: Secondary | ICD-10-CM | POA: Insufficient documentation

## 2017-11-05 DIAGNOSIS — Z79899 Other long term (current) drug therapy: Secondary | ICD-10-CM | POA: Insufficient documentation

## 2017-11-05 DIAGNOSIS — M546 Pain in thoracic spine: Secondary | ICD-10-CM

## 2017-11-05 DIAGNOSIS — Z888 Allergy status to other drugs, medicaments and biological substances status: Secondary | ICD-10-CM | POA: Insufficient documentation

## 2017-11-05 DIAGNOSIS — Z885 Allergy status to narcotic agent status: Secondary | ICD-10-CM | POA: Diagnosis not present

## 2017-11-05 DIAGNOSIS — M5010 Cervical disc disorder with radiculopathy, unspecified cervical region: Secondary | ICD-10-CM | POA: Diagnosis not present

## 2017-11-05 DIAGNOSIS — M797 Fibromyalgia: Secondary | ICD-10-CM | POA: Diagnosis not present

## 2017-11-05 DIAGNOSIS — F17210 Nicotine dependence, cigarettes, uncomplicated: Secondary | ICD-10-CM | POA: Diagnosis not present

## 2017-11-05 DIAGNOSIS — R7989 Other specified abnormal findings of blood chemistry: Secondary | ICD-10-CM | POA: Diagnosis not present

## 2017-11-05 DIAGNOSIS — R0600 Dyspnea, unspecified: Secondary | ICD-10-CM | POA: Diagnosis not present

## 2017-11-05 DIAGNOSIS — R0781 Pleurodynia: Secondary | ICD-10-CM | POA: Diagnosis not present

## 2017-11-05 DIAGNOSIS — N179 Acute kidney failure, unspecified: Secondary | ICD-10-CM | POA: Diagnosis present

## 2017-11-05 DIAGNOSIS — F329 Major depressive disorder, single episode, unspecified: Secondary | ICD-10-CM | POA: Diagnosis not present

## 2017-11-05 DIAGNOSIS — R0789 Other chest pain: Secondary | ICD-10-CM | POA: Diagnosis not present

## 2017-11-05 DIAGNOSIS — K219 Gastro-esophageal reflux disease without esophagitis: Secondary | ICD-10-CM | POA: Diagnosis not present

## 2017-11-05 DIAGNOSIS — N3010 Interstitial cystitis (chronic) without hematuria: Secondary | ICD-10-CM | POA: Diagnosis present

## 2017-11-05 DIAGNOSIS — R0602 Shortness of breath: Secondary | ICD-10-CM | POA: Diagnosis not present

## 2017-11-05 DIAGNOSIS — E872 Acidosis: Principal | ICD-10-CM | POA: Insufficient documentation

## 2017-11-05 DIAGNOSIS — F419 Anxiety disorder, unspecified: Secondary | ICD-10-CM | POA: Insufficient documentation

## 2017-11-05 DIAGNOSIS — R0902 Hypoxemia: Secondary | ICD-10-CM | POA: Diagnosis not present

## 2017-11-05 DIAGNOSIS — I2510 Atherosclerotic heart disease of native coronary artery without angina pectoris: Secondary | ICD-10-CM | POA: Diagnosis not present

## 2017-11-05 DIAGNOSIS — F1920 Other psychoactive substance dependence, uncomplicated: Secondary | ICD-10-CM | POA: Diagnosis not present

## 2017-11-05 DIAGNOSIS — I10 Essential (primary) hypertension: Secondary | ICD-10-CM | POA: Diagnosis not present

## 2017-11-05 DIAGNOSIS — J45909 Unspecified asthma, uncomplicated: Secondary | ICD-10-CM | POA: Diagnosis not present

## 2017-11-05 DIAGNOSIS — E785 Hyperlipidemia, unspecified: Secondary | ICD-10-CM | POA: Insufficient documentation

## 2017-11-05 DIAGNOSIS — F17200 Nicotine dependence, unspecified, uncomplicated: Secondary | ICD-10-CM

## 2017-11-05 DIAGNOSIS — Z743 Need for continuous supervision: Secondary | ICD-10-CM | POA: Diagnosis not present

## 2017-11-05 DIAGNOSIS — R079 Chest pain, unspecified: Secondary | ICD-10-CM | POA: Diagnosis not present

## 2017-11-05 LAB — CBC WITH DIFFERENTIAL/PLATELET
BASOS ABS: 0 10*3/uL (ref 0.0–0.1)
BASOS PCT: 0 %
EOS PCT: 1 %
Eosinophils Absolute: 0.1 10*3/uL (ref 0.0–0.7)
HCT: 36.4 % (ref 36.0–46.0)
Hemoglobin: 12.2 g/dL (ref 12.0–15.0)
LYMPHS PCT: 4 %
Lymphs Abs: 0.4 10*3/uL — ABNORMAL LOW (ref 0.7–4.0)
MCH: 29.7 pg (ref 26.0–34.0)
MCHC: 33.5 g/dL (ref 30.0–36.0)
MCV: 88.6 fL (ref 78.0–100.0)
MONO ABS: 1 10*3/uL (ref 0.1–1.0)
Monocytes Relative: 10 %
Neutro Abs: 8.2 10*3/uL — ABNORMAL HIGH (ref 1.7–7.7)
Neutrophils Relative %: 85 %
PLATELETS: 195 10*3/uL (ref 150–400)
RBC: 4.11 MIL/uL (ref 3.87–5.11)
RDW: 13 % (ref 11.5–15.5)
WBC: 9.7 10*3/uL (ref 4.0–10.5)

## 2017-11-05 LAB — COMPREHENSIVE METABOLIC PANEL
ALBUMIN: 3.7 g/dL (ref 3.5–5.0)
ALT: 13 U/L (ref 0–44)
AST: 21 U/L (ref 15–41)
Alkaline Phosphatase: 61 U/L (ref 38–126)
Anion gap: 11 (ref 5–15)
BUN: 17 mg/dL (ref 8–23)
CHLORIDE: 105 mmol/L (ref 98–111)
CO2: 27 mmol/L (ref 22–32)
Calcium: 9.4 mg/dL (ref 8.9–10.3)
Creatinine, Ser: 1.31 mg/dL — ABNORMAL HIGH (ref 0.44–1.00)
GFR calc Af Amer: 49 mL/min — ABNORMAL LOW (ref >60)
GFR, EST NON AFRICAN AMERICAN: 43 mL/min — AB (ref >60)
GLUCOSE: 125 mg/dL — AB (ref 70–99)
POTASSIUM: 3.5 mmol/L (ref 3.5–5.1)
Sodium: 143 mmol/L (ref 135–145)
Total Bilirubin: 0.8 mg/dL (ref 0.3–1.2)
Total Protein: 7 g/dL (ref 6.5–8.1)

## 2017-11-05 LAB — URINALYSIS, ROUTINE W REFLEX MICROSCOPIC
Bilirubin Urine: NEGATIVE
GLUCOSE, UA: NEGATIVE mg/dL
KETONES UR: NEGATIVE mg/dL
LEUKOCYTES UA: NEGATIVE
NITRITE: NEGATIVE
PH: 5 (ref 5.0–8.0)
PROTEIN: NEGATIVE mg/dL
Specific Gravity, Urine: 1.011 (ref 1.005–1.030)

## 2017-11-05 LAB — RAPID URINE DRUG SCREEN, HOSP PERFORMED
AMPHETAMINES: NOT DETECTED
Barbiturates: NOT DETECTED
Benzodiazepines: NOT DETECTED
Cocaine: POSITIVE — AB
OPIATES: POSITIVE — AB
TETRAHYDROCANNABINOL: POSITIVE — AB

## 2017-11-05 LAB — CBC
HEMATOCRIT: 40.1 % (ref 36.0–46.0)
HEMOGLOBIN: 13.1 g/dL (ref 12.0–15.0)
MCH: 28.7 pg (ref 26.0–34.0)
MCHC: 32.7 g/dL (ref 30.0–36.0)
MCV: 87.9 fL (ref 78.0–100.0)
Platelets: 224 10*3/uL (ref 150–400)
RBC: 4.56 MIL/uL (ref 3.87–5.11)
RDW: 13.1 % (ref 11.5–15.5)
WBC: 8.5 10*3/uL (ref 4.0–10.5)

## 2017-11-05 LAB — I-STAT CG4 LACTIC ACID, ED
LACTIC ACID, VENOUS: 2.34 mmol/L — AB (ref 0.5–1.9)
Lactic Acid, Venous: 0.77 mmol/L (ref 0.5–1.9)

## 2017-11-05 LAB — TROPONIN I

## 2017-11-05 LAB — BRAIN NATRIURETIC PEPTIDE: B Natriuretic Peptide: 326.6 pg/mL — ABNORMAL HIGH (ref 0.0–100.0)

## 2017-11-05 LAB — TSH: TSH: 0.363 u[IU]/mL (ref 0.350–4.500)

## 2017-11-05 LAB — I-STAT TROPONIN, ED: TROPONIN I, POC: 0 ng/mL (ref 0.00–0.08)

## 2017-11-05 LAB — CREATININE, SERUM
Creatinine, Ser: 1.25 mg/dL — ABNORMAL HIGH (ref 0.44–1.00)
GFR calc non Af Amer: 45 mL/min — ABNORMAL LOW (ref >60)
GFR, EST AFRICAN AMERICAN: 52 mL/min — AB (ref >60)

## 2017-11-05 MED ORDER — ALBUTEROL SULFATE (2.5 MG/3ML) 0.083% IN NEBU
5.0000 mg | INHALATION_SOLUTION | Freq: Once | RESPIRATORY_TRACT | Status: AC
  Administered 2017-11-05: 5 mg via RESPIRATORY_TRACT
  Filled 2017-11-05: qty 6

## 2017-11-05 MED ORDER — METHYLPREDNISOLONE SODIUM SUCC 125 MG IJ SOLR
125.0000 mg | Freq: Once | INTRAMUSCULAR | Status: AC
  Administered 2017-11-05: 125 mg via INTRAVENOUS
  Filled 2017-11-05: qty 2

## 2017-11-05 MED ORDER — OXYCODONE HCL 5 MG PO TABS
5.0000 mg | ORAL_TABLET | ORAL | Status: DC | PRN
  Administered 2017-11-05 – 2017-11-06 (×2): 5 mg via ORAL
  Filled 2017-11-05 (×2): qty 1

## 2017-11-05 MED ORDER — ALBUTEROL SULFATE (2.5 MG/3ML) 0.083% IN NEBU
2.5000 mg | INHALATION_SOLUTION | Freq: Four times a day (QID) | RESPIRATORY_TRACT | Status: DC | PRN
  Administered 2017-11-05: 2.5 mg via RESPIRATORY_TRACT
  Filled 2017-11-05: qty 3

## 2017-11-05 MED ORDER — IPRATROPIUM-ALBUTEROL 0.5-2.5 (3) MG/3ML IN SOLN
3.0000 mL | Freq: Once | RESPIRATORY_TRACT | Status: AC
  Administered 2017-11-05: 3 mL via RESPIRATORY_TRACT
  Filled 2017-11-05: qty 3

## 2017-11-05 MED ORDER — VITAMIN D3 1.25 MG (50000 UT) PO CAPS: 1.0000 | ORAL_CAPSULE | ORAL | Status: DC

## 2017-11-05 MED ORDER — POLYETHYLENE GLYCOL 3350 17 G PO PACK
17.0000 g | PACK | Freq: Every day | ORAL | Status: DC | PRN
  Administered 2017-11-06: 17 g via ORAL
  Filled 2017-11-05: qty 1

## 2017-11-05 MED ORDER — TRAZODONE HCL 50 MG PO TABS
50.0000 mg | ORAL_TABLET | Freq: Every evening | ORAL | Status: DC | PRN
  Administered 2017-11-06: 50 mg via ORAL
  Filled 2017-11-05: qty 1

## 2017-11-05 MED ORDER — ALBUTEROL SULFATE HFA 108 (90 BASE) MCG/ACT IN AERS: 2.0000 | INHALATION_SPRAY | Freq: Four times a day (QID) | RESPIRATORY_TRACT | Status: DC | PRN

## 2017-11-05 MED ORDER — ACETAMINOPHEN 650 MG RE SUPP: 650.0000 mg | Freq: Four times a day (QID) | RECTAL | Status: DC | PRN

## 2017-11-05 MED ORDER — ACETAMINOPHEN 325 MG PO TABS: 650.0000 mg | ORAL_TABLET | Freq: Four times a day (QID) | ORAL | Status: DC | PRN

## 2017-11-05 MED ORDER — AMITRIPTYLINE HCL 10 MG PO TABS
10.0000 mg | ORAL_TABLET | Freq: Every day | ORAL | Status: DC
  Administered 2017-11-05: 10 mg via ORAL
  Filled 2017-11-05: qty 1

## 2017-11-05 MED ORDER — ENOXAPARIN SODIUM 40 MG/0.4ML ~~LOC~~ SOLN
40.0000 mg | SUBCUTANEOUS | Status: DC
  Administered 2017-11-05: 40 mg via SUBCUTANEOUS
  Filled 2017-11-05: qty 0.4

## 2017-11-05 MED ORDER — VENLAFAXINE HCL ER 150 MG PO CP24
150.0000 mg | ORAL_CAPSULE | Freq: Every day | ORAL | Status: DC
  Administered 2017-11-05 – 2017-11-06 (×2): 150 mg via ORAL
  Filled 2017-11-05 (×2): qty 1

## 2017-11-05 MED ORDER — VITAMIN D (ERGOCALCIFEROL) 1.25 MG (50000 UNIT) PO CAPS
50000.0000 [IU] | ORAL_CAPSULE | ORAL | Status: DC
  Administered 2017-11-06: 50000 [IU] via ORAL
  Filled 2017-11-05: qty 1

## 2017-11-05 NOTE — ED Provider Notes (Signed)
[facility] DEPT Provider Note   CSN: [id number] Arrival date & time: 11/05/17  1154     History   Chief Complaint Chief Complaint  Patient presents with  . Shortness of Breath    HPI Laurie [name] is a 62 y.o. female.  HPI  Patient presents with her daughter who assists with the HPI. Patient knowledge his multiple medical issues including fibromyalgia, arthritis, cigarette addiction. She now presents after ongoing weakness has been present for quite some time, but now with concern for cough, dyspnea, pain. This illness began yesterday, since on is been persistent, without improvement in spite of using OTC medication. No objective fever, no syncope, no nausea, vomiting.   Past Medical History:  Diagnosis Date  . Allergy   . Anxiety   . Arthritis    RA  . Asthma   . Chronic back pain   . Depression   . Esophageal stricture   . GERD (gastroesophageal reflux disease)   . Heart murmur   . Hyperlipidemia   . Hypertension   . IBS (irritable bowel syndrome)   . Interstitial cystitis   . Neuromuscular disorder [facility])    fibromyalgia    Patient Active Problem List   Diagnosis Date Noted  . Low serum vitamin D 08/23/2015  . Hyperlipidemia 11/08/2014  . Reflux esophagitis 03/23/2014  . Dysphagia, pharyngoesophageal phase 03/23/2014  . Pain in the chest 03/23/2014  . Special screening for malignant neoplasms, colon 07/09/2013  . Chest pain 04/23/2012  . Dyspnea 04/23/2012  . Murmur 04/23/2012  . INTERSTITIAL CYSTITIS 01/01/2010  . SWELLING, NECK 01/01/2010  . CAD, NATIVE VESSEL 12/19/2009  . Palpitations 11/21/2009  . ABNORMAL CV (STRESS) TEST 11/21/2009  . Essential hypertension 10/31/2009  . ELECTROCARDIOGRAM, ABNORMAL 10/25/2009  . PLANTAR FASCIITIS, BILATERAL 10/12/2009  . CERVICAL RADICULOPATHY 07/04/2008  . Pain in soft tissues of limb 03/25/2008  . DEGENERATIVE DISC DISEASE, CERVICAL SPINE, W/RADICULOPATHY 09/08/2007  .  LOW BACK PAIN 09/08/2007  . TOBACCO USE 03/23/2007  . OSTEOARTHROSIS, LOCAL NOS, LOWER LEG 11/07/2006  . STATE, SYMPTOMATIC MENOPAUSE/FEM CLIMACTERIC 10/27/2006  . DEPRESSION 09/15/2006  . Thoracic or lumbosacral neuritis or radiculitis, unspecified 09/15/2006  . IBS 09/09/2006  . STRICTURE, ESOPHAGEAL 03/16/2001    Past Surgical History:  Procedure Laterality Date  . ABDOMINAL HYSTERECTOMY    . APPENDECTOMY    . CERVICAL FUSION    . CERVICAL LAMINECTOMY    . ESOPHAGOGASTRODUODENOSCOPY    . FINGER SURGERY    . LUMBAR FUSION    . TONSILLECTOMY       OB History   None      Home Medications    Prior to Admission medications   Medication Sig Start Date End Date Taking? Authorizing Provider  albuterol (PROVENTIL HFA;VENTOLIN HFA) 108 (90 Base) MCG/ACT inhaler Inhale 2 puffs into the lungs every 6 (six) hours as needed for wheezing or shortness of breath. 05/20/17   Nafziger, Tommi Rumps, NP  amitriptyline (ELAVIL) 10 MG tablet Take 1 tablet (10 mg total) by mouth at bedtime. 07/18/17   Burchette, Alinda Sierras, MD  Cholecalciferol (DIALYVITE VITAMIN D3 MAX) 09628 units TABS Take 1 tablet by mouth once a week. 10/01/16   Burchette, Alinda Sierras, MD  Cholecalciferol (VITAMIN D3) 50000 units CAPS TAKE 1 CAPSULE BY MOUTH ONCE WEEKLY 06/18/17   Burchette, Alinda Sierras, MD  losartan-hydrochlorothiazide (HYZAAR) 100-12.5 MG tablet TAKE ONE TABLET BY MOUTH DAILY 04/15/17   Burchette, Alinda Sierras, MD  oxyCODONE-acetaminophen (PERCOCET/ROXICET) 5-325 MG tablet Take 1-2  tablets by mouth every 6 (six) hours as needed for severe pain. 04/09/16   Burchette, Alinda Sierras, MD  pantoprazole (PROTONIX) 40 MG tablet Take 1 tablet (40 mg total) by mouth 2 (two) times daily. Take 1 tablet 2 times daily 07/18/17   Burchette, Alinda Sierras, MD  rosuvastatin (CRESTOR) 40 MG tablet Take 40 mg by mouth daily.    [provider]  venlafaxine XR (EFFEXOR-XR) 150 MG 24 hr capsule TAKE ONE CAPSULE BY MOUTH DAILY 03/17/17   Burchette, Alinda Sierras, MD     Family History Family History  Problem Relation Age of Onset  . Dementia Mother   . Heart attack Father   . Coronary artery disease Father   . Cancer Brother        esophageal  . Esophageal cancer Brother   . Coronary artery disease Paternal Aunt   . Stomach cancer Paternal Aunt   . Coronary artery disease Paternal Grandmother   . Aneurysm Brother        aortic  . Rectal cancer Neg Hx   . Colon cancer Neg Hx     Social History Social History   Tobacco Use  . Smoking status: Current Every Day Smoker    Packs/day: 0.50    Types: Cigarettes  . Smokeless tobacco: Never Used  . Tobacco comment: trying to quit   Substance Use Topics  . Alcohol use: No    Alcohol/week: 0.0 standard drinks  . Drug use: No     Allergies   Codeine; Lisinopril; Sulfonamide derivatives; and Tetracyclines & related   Review of Systems Review of Systems  Constitutional:       Per HPI, otherwise negative  HENT:       Per HPI, otherwise negative  Respiratory:       Per HPI, otherwise negative  Cardiovascular:       Per HPI, otherwise negative  Gastrointestinal: Negative for vomiting.  Endocrine:       Negative aside from HPI  Genitourinary:       Neg aside from HPI   Musculoskeletal:       Per HPI, otherwise negative  Skin: Negative.   Neurological: Negative for syncope.     Physical Exam Updated Vital Signs BP (!) 118/53   Pulse 79   Temp 99.5 F (37.5 C) (Oral)   Resp 13   Ht 5\' 10"  (1.778 m)   Wt 70.3 kg   SpO2 100%   BMI 22.24 kg/m   Physical Exam  Constitutional: She is oriented to person, place, and time. She has a sickly appearance. No distress.  HENT:  Head: Normocephalic and atraumatic.  Eyes: Conjunctivae and EOM are normal.  Cardiovascular: Normal rate and regular rhythm.  Pulmonary/Chest: No stridor. Tachypnea noted. She has wheezes.  Abdominal: She exhibits no distension.  Musculoskeletal: She exhibits no edema.  Neurological: She is alert and  oriented to person, place, and time. No cranial nerve deficit.  Skin: Skin is warm and dry.  Psychiatric: She has a normal mood and affect.  Nursing note and vitals reviewed.    ED Treatments / Results  Labs (all labs ordered are listed, but only abnormal results are displayed) Labs Reviewed  COMPREHENSIVE METABOLIC PANEL - Abnormal; Notable for the following components:      Result Value   Glucose, Bld 125 (*)    Creatinine, Ser 1.31 (*)    GFR calc non Af Amer 43 (*)    GFR calc Af Amer 49 (*)  All other components within normal limits  CBC WITH DIFFERENTIAL/PLATELET - Abnormal; Notable for the following components:   Neutro Abs 8.2 (*)    Lymphs Abs 0.4 (*)    All other components within normal limits  BRAIN NATRIURETIC PEPTIDE - Abnormal; Notable for the following components:   B Natriuretic Peptide 326.6 (*)    All other components within normal limits  I-STAT CG4 LACTIC ACID, ED - Abnormal; Notable for the following components:   Lactic Acid, Venous 2.34 (*)    All other components within normal limits  URINALYSIS, ROUTINE W REFLEX MICROSCOPIC  I-STAT CG4 LACTIC ACID, ED    EKG EKG Interpretation  Date/Time:  Wednesday November 05 2017 12:19:17 EDT Ventricular Rate:  79 PR Interval:    QRS Duration: 113 QT Interval:  404 QTC Calculation: 464 R Axis:   72 Text Interpretation:  Sinus rhythm Probable left atrial enlargement Borderline intraventricular conduction delay ST-t wave abnormality Abnormal ekg Confirmed by Carmin Muskrat [phone]) on 11/05/2017 12:21:37 PM   Radiology Dg Chest 2 View  Result Date: 11/05/2017 CLINICAL DATA:  Dyspnea. EXAM: CHEST - 2 VIEW COMPARISON:  Radiographs of March 13, 2016. FINDINGS: The heart size and mediastinal contours are within normal limits. Both lungs are clear. No pneumothorax or pleural effusion is noted. The visualized skeletal structures are unremarkable. IMPRESSION: No active cardiopulmonary disease. Electronically  Signed   By: Marijo Conception, M.D.   On: 11/05/2017 13:30    Procedures Procedures (including critical care time)  Medications Ordered in ED Medications  methylPREDNISolone sodium succinate (SOLU-MEDROL) 125 mg/2 mL injection 125 mg (125 mg Intravenous Given 11/05/17 1400)  albuterol (PROVENTIL) (2.5 MG/3ML) 0.083% nebulizer solution 5 mg (5 mg Nebulization Given 11/05/17 1357)     Initial Impression / Assessment and Plan / ED Course  I have reviewed the triage vital signs and the nursing notes.  Pertinent labs & imaging results that were available during my care of the patient were reviewed by me and considered in my medical decision making (see chart for details).     3:14 PM After the initial evaluation the patient received albuterol, steroids, has some improvement in her condition, though with persistent mild increased work of breathing. Initial labs reassuring, for no evidence for coronary ischemia, but there is elevated BNP, as well as elevated creatinine level Patient's last echocardiogram, reviewed, per chart review, was 5 years ago, but was essentially normal. Results below: Study Conclusions  - Left ventricle: The cavity size was normal. Wall thickness   was normal. Systolic function was normal. The estimated   ejection fraction was in the range of 60% to 65%. Wall   motion was normal; there were no regional wall motion   abnormalities. Left ventricular diastolic function   parameters were normal. - Aortic valve: Trivial regurgitation. - Mitral valve: Mild regurgitation. - Atrial septum: No defect or patent foramen ovale was   identified. - Pericardium, extracardiac: A trivial pericardial effusion   was identified. Transthoracic echocardiography.  M-mode, complete 2D, spectral Doppler, and color Doppler.  Height:  Height: 177.8cm. Height: 70in.  Weight:  Weight: 82.1kg. Weight: 180.6lb.  Body mass index:  BMI: 26kg/m^2.  Body surface area:    BSA: 23m^2.  Blood  pressure:     135/80.  Patient status:  Outpatient.  Location:  [facility]   This elderly female presents with cough, congestion, chest pain. Patient is awake and alert, but has increased work of breathing, poor breath sounds on initial  exam. Patient is a smoker, there is suspicion for undiagnosed COPD/emphysema. Patient's improvement here with bronchodilators, steroids is suggestive this. However, the patient was also found of elevated BNP. I discussed patient's case with her cardiology colleagues, and they are available as a consulting team as needed. Given the patient's increased work of breathing, concern for COPD exacerbation, as well as elevated BNP as above, patient was admitted for further evaluation and management. Final Clinical Impressions(s) / ED Diagnoses  Shortness of breath   Carmin Muskrat, MD 11/05/17 [phone]

## 2017-11-05 NOTE — Telephone Encounter (Signed)
Pt called with C/O painful breathing and cold symptoms She stated she is wheezing and rates the pain at severe when taking a breath. Pt is talking in full sentences. She states she feels SOB even when at rest. She states she is anxious.  She has no way to check her temperature but states she feels like she is feverish. Appointment made per protocol. Care advice read to patient. Pt verbalized understanding of all. Reason for Disposition . [1] MILD difficulty breathing (e.g., minimal/no SOB at rest, SOB with walking, pulse <100) AND [2] NEW-onset or WORSE than normal  Answer Assessment - Initial Assessment Questions 1. RESPIRATORY STATUS: "Describe your breathing?" (e.g., wheezing, shortness of breath, unable to speak, severe coughing)      wheezing 2. ONSET: "When did this breathing problem begin?"      yesterday 3. PATTERN "Does the difficult breathing come and go, or has it been constant since it started?"      constant 4. SEVERITY: "How bad is your breathing?" (e.g., mild, moderate, severe)    - MILD: No SOB at rest, mild SOB with walking, speaks normally in sentences, can lay down, no retractions, pulse < 100.    - MODERATE: SOB at rest, SOB with minimal exertion and prefers to sit, cannot lie down flat, speaks in phrases, mild retractions, audible wheezing, pulse 100-120.    - SEVERE: Very SOB at rest, speaks in single words, struggling to breathe, sitting hunched forward, retractions, pulse > 120      severe 5. RECURRENT SYMPTOM: "Have you had difficulty breathing before?" If so, ask: "When was the last time?" and "What happened that time?"      no 6. CARDIAC HISTORY: "Do you have any history of heart disease?" (e.g., heart attack, angina, bypass surgery, angioplasty)      no 7. LUNG HISTORY: "Do you have any history of lung disease?"  (e.g., pulmonary embolus, asthma, emphysema)     no 8. CAUSE: "What do you think is causing the breathing problem?"      pneumonia 9. OTHER SYMPTOMS: "Do  you have any other symptoms? (e.g., dizziness, runny nose, cough, chest pain, fever)     Stuffy nose fever 10. PREGNANCY: "Is there any chance you are pregnant?" "When was your last menstrual period?"       N/A 11. TRAVEL: "Have you traveled out of the country in the last month?" (e.g., travel history, exposures)       no  Protocols used: BREATHING DIFFICULTY-A-AH

## 2017-11-05 NOTE — Progress Notes (Signed)
Taking over care of patient. Agree with previous RN assessment. Denies any needs at this time. Will continue to monitor.  

## 2017-11-05 NOTE — ED Notes (Addendum)
Ed provider Vanita Panda notified patient has a critical lactic acid value of 2.34

## 2017-11-05 NOTE — Progress Notes (Signed)
Subjective:     Patient ID: Laurie [name], female   DOB: 12-24-1955, 62 y.o.   MRN: [id number]  HPI Patient is seen with complaints of dyspnea, right-sided pleuritic pain, chills, progressive right thoracic back pain.  She has some chronic back pain but yesterday particularly had increased pain with breathing right upper thoracic area.  She states she has some chills last night.  Possible subjective fever but temperature not taken.  She had occasional cough.  She has long history of nicotine use.  She took her albuterol inhaler this morning without much improvement.  She was having severe back pain yesterday and apparently took someone else's fentanyl spray-unknown dosage.  She denies taking any pain medications today.  She states her back pain is severe.  She is also had some anterior chest pain over the past couple of days somewhat intermittently.  She has known history of CAD.  Denies any radiation to the arm or neck.  Much more dyspneic than usual.  She is having difficulty with ambulating secondary to dyspnea  Chronic problems include -History of CAD (cath 10/11- "moderate" CAD. -Hypertension -GERD -Chronic cervical radiculopathy -History of interstitial cystitis -Ongoing nicotine use -Hyperlipidemia  Past Medical History:  Diagnosis Date  . Allergy   . Anxiety   . Arthritis    RA  . Asthma   . Chronic back pain   . Depression   . Esophageal stricture   . GERD (gastroesophageal reflux disease)   . Heart murmur   . Hyperlipidemia   . Hypertension   . IBS (irritable bowel syndrome)   . Interstitial cystitis   . Neuromuscular disorder ([place])    fibromyalgia   Past Surgical History:  Procedure Laterality Date  . ABDOMINAL HYSTERECTOMY    . APPENDECTOMY    . CERVICAL FUSION    . CERVICAL LAMINECTOMY    . ESOPHAGOGASTRODUODENOSCOPY    . FINGER SURGERY    . LUMBAR FUSION    . TONSILLECTOMY      reports that she has been smoking cigarettes. She has been smoking  about 0.50 packs per day. She has never used smokeless tobacco. She reports that she does not drink alcohol or use drugs. family history includes Aneurysm in her brother; Cancer in her brother; Coronary artery disease in her father, paternal aunt, and paternal grandmother; Dementia in her mother; Esophageal cancer in her brother; Heart attack in her father; Stomach cancer in her paternal aunt. Allergies  Allergen Reactions  . Codeine     REACTION: Vomiting  . Lisinopril Cough  . Sulfonamide Derivatives     REACTION: Upset GI  . Tetracyclines & Related Rash         Review of Systems  Constitutional: Positive for chills, fatigue and fever.  Respiratory: Positive for cough and shortness of breath.   Cardiovascular: Positive for chest pain. Negative for palpitations and leg swelling.  Gastrointestinal: Negative for abdominal pain, nausea and vomiting.  Genitourinary: Negative for dysuria.  Musculoskeletal: Positive for back pain.  Skin: Negative for rash.  Neurological: Positive for weakness.  Psychiatric/Behavioral: Negative for confusion.       Objective:   Physical Exam  Constitutional:  Patient sitting at rest in mild distress secondary to pain  HENT:  Mouth/Throat: Oropharynx is clear and moist.  Cardiovascular: Normal rate and regular rhythm.  Pulmonary/Chest:  No retractions.  She has some scattered wheezes. Initial pulse oximetry 91 to 92% and improved to 94% with oxygen  Musculoskeletal: She exhibits no edema.  No  localized spinal tenderness or back tenderness  Neurological: She is alert.       Assessment:     Patient has multiple chronic problems as above including history of CAD and long history of ongoing nicotine use who presents with 2-day history of intermittent chest pain and 1 day history of increasing right thoracic back pain with some associated chills and hypoxia Rule out pneumonia.  Rule out pulmonary embolus    Plan:     -Patient was in mild  respiratory distress when she got here.  She did improve with oxygen slightly. -We recommended further evaluation in ER and patient will be transported by EMS.  Stable at time of discharge  [name] MD [facility] at [facility]

## 2017-11-05 NOTE — H&P (Addendum)
History and Physical    Laurie [name] XIP:[id number] DOB: 26-Nov-1955 DOA: 11/05/2017  PCP: [name], MD Patient coming from: Home  Chief Complaint: Shortness of breath  HPI: Laurie [name] is a 62 y.o. female with medical history significant for tobacco use, depression, hypertension, CAD and arthritis who presented with shortness of breath. Patient presented with worsening shortness of breath for 2 days.  She also reports back pain between her shoulder blades that she describes as sharp.  Back pain has eased off.  She also reported lightheadedness, fatigue, popping in the ear and chills.  She denies URI symptoms, cough, chest pain, orthopnea, PND, leg swelling, nausea, vomiting, diarrhea or dysuria.  She thinks she might have been constipated.  She does remember her last bowel movement.  She denies dysuria but states that it is hard to go.   She lives with her boyfriend.  She admits to smoking tobacco,  marijuana and cocaine use.  Last cocaine use 2 days ago.  In ED, vital signs within normal limits.  She was put on 2 L by nasal cannula.  Labs significant for serum creatinine to 1.31 (baseline 0.8-1), BNP 03/16/2025 and lactic acid to 2.34 that has trended to 0.77.  EKG with some T wave changes in inferior lead.  Chest x-ray without acute cardiopulmonary finding.  She was given DuoNeb x2 and Solu-Medrol. I was called to admit patient for increased work of breathing and possible COPD exacerbation.  ROS Review of Systems  Constitutional: Positive for chills and malaise/fatigue. Negative for fever and weight loss.  HENT: Negative for sore throat.   Eyes: Negative for photophobia and pain.  Respiratory: Positive for shortness of breath. Negative for cough.   Cardiovascular: Negative for chest pain, palpitations, orthopnea, leg swelling and PND.  Gastrointestinal: Negative for abdominal pain, blood in stool, diarrhea, melena and vomiting.  Genitourinary: Negative for dysuria.    Musculoskeletal: Positive for back pain.  Skin: Negative for rash.  Neurological: Negative for speech change, focal weakness, weakness and headaches.  Endo/Heme/Allergies: Does not bruise/bleed easily.  Psychiatric/Behavioral: Positive for depression. Negative for substance abuse. The patient is not nervous/anxious.     PMH Past Medical History:  Diagnosis Date  . Allergy   . Anxiety   . Arthritis    RA  . Asthma   . Chronic back pain   . Depression   . Esophageal stricture   . GERD (gastroesophageal reflux disease)   . Heart murmur   . Hyperlipidemia   . Hypertension   . IBS (irritable bowel syndrome)   . Interstitial cystitis   . Neuromuscular disorder ([place])    fibromyalgia   PSH Past Surgical History:  Procedure Laterality Date  . ABDOMINAL HYSTERECTOMY    . APPENDECTOMY    . CERVICAL FUSION    . CERVICAL LAMINECTOMY    . ESOPHAGOGASTRODUODENOSCOPY    . FINGER SURGERY    . LUMBAR FUSION    . TONSILLECTOMY     Fam HX Family History  Problem Relation Age of Onset  . Dementia Mother   . Heart attack Father   . Coronary artery disease Father   . Cancer Brother        esophageal  . Esophageal cancer Brother   . Coronary artery disease Paternal Aunt   . Stomach cancer Paternal Aunt   . Coronary artery disease Paternal Grandmother   . Aneurysm Brother        aortic  . Rectal cancer Neg Hx   .  Colon cancer Neg Hx    Social Hx  reports that she has been smoking cigarettes. She has been smoking about 0.50 packs per day. She has never used smokeless tobacco. She reports that she does not drink alcohol or use drugs.  Allergy Allergies  Allergen Reactions  . Codeine     REACTION: Vomiting  . Lisinopril Cough  . Sulfonamide Derivatives     REACTION: Upset GI  . Tetracyclines & Related Rash   Home Meds Prior to Admission medications   Medication Sig Start Date End Date Taking? Authorizing Provider  albuterol (PROVENTIL HFA;VENTOLIN HFA) 108 (90 Base)  MCG/ACT inhaler Inhale 2 puffs into the lungs every 6 (six) hours as needed for wheezing or shortness of breath. 05/20/17  Yes Nafziger, Tommi Rumps, NP  amitriptyline (ELAVIL) 10 MG tablet Take 1 tablet (10 mg total) by mouth at bedtime. 07/18/17  Yes Burchette, Alinda Sierras, MD  Cholecalciferol (VITAMIN D3) 50000 units CAPS TAKE 1 CAPSULE BY MOUTH ONCE WEEKLY 06/18/17  Yes Burchette, Alinda Sierras, MD  losartan-hydrochlorothiazide (HYZAAR) 100-12.5 MG tablet TAKE ONE TABLET BY MOUTH DAILY 04/15/17  Yes Burchette, Alinda Sierras, MD  Phenylephrine-DM-GG (MUCINEX FAST-MAX CONGEST COUGH PO) Take 1 tablet by mouth daily as needed (cough).   Yes [provider]  venlafaxine XR (EFFEXOR-XR) 150 MG 24 hr capsule TAKE ONE CAPSULE BY MOUTH DAILY 03/17/17  Yes Burchette, Alinda Sierras, MD  Cholecalciferol (DIALYVITE VITAMIN D3 MAX) 28413 units TABS Take 1 tablet by mouth once a week. Patient not taking: Reported on 11/05/2017 10/01/16   [name], MD  oxyCODONE-acetaminophen (PERCOCET/ROXICET) 5-325 MG tablet Take 1-2 tablets by mouth every 6 (six) hours as needed for severe pain. Patient not taking: Reported on 11/05/2017 04/09/16   [name], MD  pantoprazole (PROTONIX) 40 MG tablet Take 1 tablet (40 mg total) by mouth 2 (two) times daily. Take 1 tablet 2 times daily Patient not taking: Reported on 11/05/2017 07/18/17   [name], MD    Physical Exam: Vitals:   11/05/17 1300 11/05/17 1358 11/05/17 1400 11/05/17 1615  BP: (!) 118/57  (!) 118/53   Pulse: 80  79   Resp: 13  13   Temp:      TempSrc:      SpO2: 95% (!) 5% 100% 99%  Weight:      Height:        Constitutional: Restless in bed Eyes: lids and conjunctivae normal, PERRLA HENMT: atraumatic. Normocephalic. Hearing grossly intact. No rhinorrhea. MMM.  Neck: normal. Supple. No mass. No thyromegaly Resp: On 2 L by nasal cannula.  Speaks in full sentence.  Normal effort. Good air movement bilaterally.  Rhonchi bilaterally.  No crackles or wheeze. CVS:  RRR. S1 & S2 heard. No murmurs. No edema.  GI: normal bowel sound. No tenderness. No distention. MSK: No obvious deformity. No focal tenderness. Moving extremities. Skin: no apparent lesion. Normal warmth.  Neuro: Awake. Alert. Oriented appropriately. CN 2-12 grossly intact. Light sensation intact. Normal strength. DTR symmetric. Psych: Restless.  Denies SI or HI.  Labs on Admission: I have personally reviewed following labs and imaging studies  CBC: Recent Labs  Lab 11/05/17 1230  WBC 9.7  NEUTROABS 8.2*  HGB 12.2  HCT 36.4  MCV 88.6  PLT 244   Basic Metabolic Panel: Recent Labs  Lab 11/05/17 1230  NA 143  K 3.5  CL 105  CO2 27  GLUCOSE 125*  BUN 17  CREATININE 1.31*  CALCIUM 9.4   GFR:  Estimated Creatinine Clearance: 48.2 mL/min (A) (by C-G formula based on SCr of 1.31 mg/dL (H)). Liver Function Tests: Recent Labs  Lab 11/05/17 1230  AST 21  ALT 13  ALKPHOS 61  BILITOT 0.8  PROT 7.0  ALBUMIN 3.7   No results for input(s): LIPASE, AMYLASE in the last 168 hours. No results for input(s): AMMONIA in the last 168 hours. Coagulation Profile: No results for input(s): INR, PROTIME in the last 168 hours. Cardiac Enzymes: No results for input(s): CKTOTAL, CKMB, CKMBINDEX, TROPONINI in the last 168 hours. BNP (last 3 results) No results for input(s): PROBNP in the last 8760 hours. HbA1C: No results for input(s): HGBA1C in the last 72 hours. CBG: No results for input(s): GLUCAP in the last 168 hours. Lipid Profile: No results for input(s): CHOL, HDL, LDLCALC, TRIG, CHOLHDL, LDLDIRECT in the last 72 hours. Thyroid Function Tests: No results for input(s): TSH, T4TOTAL, FREET4, T3FREE, THYROIDAB in the last 72 hours. Anemia Panel: No results for input(s): VITAMINB12, FOLATE, FERRITIN, TIBC, IRON, RETICCTPCT in the last 72 hours. Urine analysis:    Component Value Date/Time   COLORURINE LT. YELLOW 11/04/2011 1127   APPEARANCEUR CLEAR 11/04/2011 1127   LABSPEC  1.010 11/04/2011 1127   PHURINE 6.5 11/04/2011 1127   GLUCOSEU NEGATIVE 11/04/2011 1127   HGBUR NEGATIVE 11/04/2011 1127   HGBUR negative 01/01/2010 1055   BILIRUBINUR neg 07/18/2017 0753   KETONESUR NEGATIVE 11/04/2011 1127   PROTEINUR Negative 07/18/2017 0753   UROBILINOGEN 0.2 07/18/2017 0753   UROBILINOGEN 0.2 11/04/2011 1127   NITRITE neg 07/18/2017 0753   NITRITE NEGATIVE 11/04/2011 1127   LEUKOCYTESUR Negative 07/18/2017 0753    Sepsis Labs: Lactic acidosis resolved. @LABRCNTIP (procalcitonin:4,lacticidven:4) )No results found for this or any previous visit (from the past 240 hour(s)).   Radiological Exams on Admission: Dg Chest 2 View  Result Date: 11/05/2017 CLINICAL DATA:  Dyspnea. EXAM: CHEST - 2 VIEW COMPARISON:  Radiographs of March 13, 2016. FINDINGS: The heart size and mediastinal contours are within normal limits. Both lungs are clear. No pneumothorax or pleural effusion is noted. The visualized skeletal structures are unremarkable. IMPRESSION: No active cardiopulmonary disease. Electronically Signed   By: Marijo Conception, M.D.   On: 11/05/2017 13:30    All images have been reviewed by me personally.   EKG: Independently reviewed.  EKG normal sinus rhythm with some T wave inversions in inferior leads.  Assessment/Plan Active Problems:   Essential hypertension   INTERSTITIAL CYSTITIS   Shortness of breath   Elevated brain natriuretic peptide (BNP) level   Polysubstance (excluding opioids) dependence, daily use (HCC)   Dyspnea: Unclear etiology.  No history of COPD but significant history of tobacco use.  Also marijuana and cocaine use which could contribute.  BNP elevated to 326 concerning for some heart strain.  Started on Solu-Medrol and DuoNeb in ED for presumed COPD. -Continue prednisone in the morning -Continue DuoNeb  Elevated BNP: No history of CHF but history of negative artery CAD in her chart.  Echo in 2014 with EF of 60-65 no significant structural  or functional abnormality.  Does not look fluid overloaded.  Chest x-ray without pulmonary edema. -Obtain echocardiogram -Can consider diuresis in the morning.  She has history of interstitial cystitis. -Per ED provider, cardiology not impressed  Polysubstance use: admits marijuana and cocaine use.  EKG with T wave changes in inferior leads.  Troponin negative -Counsel -UDS -Rule out ACS  AKI: likely prerenal. On ARB and HCTZ as well -Avoid nephrotoxic meds -  Hold home BP meds -Repeat BMP in am.  Lactic acidosis: Resolved in ED  Constipation:  -Miralax Other chronic conditions stable and continue home medications.  DVT prophylaxis: Lovenox Code Status: Full code Family Communication: No family member at bedside Disposition Plan: Admit to telemetry  Consults called: none Admission status: Observation for ACS rule out given dyspnea, T wave changes and cocaine use.   Mercy Riding MD [facility] Pager [phone]  If 7PM-7AM, please contact night-coverage [email or web address] Password [facility]  11/05/2017, 6:46 PM

## 2017-11-05 NOTE — ED Notes (Signed)
ED TO INPATIENT HANDOFF REPORT  Name/Age/Gender Laurie [name] 62 y.o. female  Code Status   Home/SNF/Other Home  Chief Complaint shortness of breath   Level of Care/Admitting Diagnosis ED Disposition    ED Disposition Condition [facility]: [place] [[id number]]  Level of Care: Telemetry [5]  Admit to tele based on following criteria: Monitor for Ischemic changes  Diagnosis: Shortness of breath [786.05.ICD-9-CM]  Admitting Physician: Mercy Riding [[id number]]  Attending Physician: Mercy Riding [[id number]]  PT Class (Do Not Modify): Observation [104]  PT Acc Code (Do Not Modify): Observation [10022]       Medical History Past Medical History:  Diagnosis Date  . Allergy   . Anxiety   . Arthritis    RA  . Asthma   . Chronic back pain   . Depression   . Esophageal stricture   . GERD (gastroesophageal reflux disease)   . Heart murmur   . Hyperlipidemia   . Hypertension   . IBS (irritable bowel syndrome)   . Interstitial cystitis   . Neuromuscular disorder (HCC)    fibromyalgia    Allergies Allergies  Allergen Reactions  . Codeine     REACTION: Vomiting  . Lisinopril Cough  . Sulfonamide Derivatives     REACTION: Upset GI  . Tetracyclines & Related Rash    IV Location/Drains/Wounds Patient Lines/Drains/Airways Status   Active Line/Drains/Airways    Name:   Placement date:   Placement time:   Site:   Days:   Peripheral IV 11/05/17 Left Antecubital   11/05/17    1204    Antecubital   less than 1   External Urinary Catheter   11/05/17    1222    -   less than 1          Labs/Imaging Results for orders placed or performed during the hospital encounter of 11/05/17 (from the past 48 hour(s))  Comprehensive metabolic panel     Status: Abnormal   Collection Time: 11/05/17 12:30 PM  Result Value Ref Range   Sodium 143 135 - 145 mmol/L   Potassium 3.5 3.5 - 5.1 mmol/L   Chloride 105 98 - 111 mmol/L   CO2 27 22 -  32 mmol/L   Glucose, Bld 125 (H) 70 - 99 mg/dL   BUN 17 8 - 23 mg/dL   Creatinine, Ser 1.31 (H) 0.44 - 1.00 mg/dL   Calcium 9.4 8.9 - 10.3 mg/dL   Total Protein 7.0 6.5 - 8.1 g/dL   Albumin 3.7 3.5 - 5.0 g/dL   AST 21 15 - 41 U/L   ALT 13 0 - 44 U/L   Alkaline Phosphatase 61 38 - 126 U/L   Total Bilirubin 0.8 0.3 - 1.2 mg/dL   GFR calc non Af Amer 43 (L) >60 mL/min   GFR calc Af Amer 49 (L) >60 mL/min    Comment: (NOTE) The eGFR has been calculated using the CKD EPI equation. This calculation has not been validated in all clinical situations. eGFR's persistently <60 mL/min signify possible Chronic Kidney Disease.    Anion gap 11 5 - 15    Comment: Performed at [facility], [place] [address], [place], [place] [address]  CBC with Differential     Status: Abnormal   Collection Time: 11/05/17 12:30 PM  Result Value Ref Range   WBC 9.7 4.0 - 10.5 K/uL   RBC 4.11 3.87 - 5.11 MIL/uL   Hemoglobin 12.2  12.0 - 15.0 g/dL   HCT 36.4 36.0 - 46.0 %   MCV 88.6 78.0 - 100.0 fL   MCH 29.7 26.0 - 34.0 pg   MCHC 33.5 30.0 - 36.0 g/dL   RDW 13.0 11.5 - 15.5 %   Platelets 195 150 - 400 K/uL   Neutrophils Relative % 85 %   Neutro Abs 8.2 (H) 1.7 - 7.7 K/uL   Lymphocytes Relative 4 %   Lymphs Abs 0.4 (L) 0.7 - 4.0 K/uL   Monocytes Relative 10 %   Monocytes Absolute 1.0 0.1 - 1.0 K/uL   Eosinophils Relative 1 %   Eosinophils Absolute 0.1 0.0 - 0.7 K/uL   Basophils Relative 0 %   Basophils Absolute 0.0 0.0 - 0.1 K/uL    Comment: Performed at [facility], [place] [address] [place], [place] [address]  Brain natriuretic peptide     Status: Abnormal   Collection Time: 11/05/17 12:30 PM  Result Value Ref Range   B Natriuretic Peptide 326.6 (H) 0.0 - 100.0 pg/mL    Comment: Performed at [facility], [place] [address], [place], [place] [address]  I-Stat CG4 Lactic Acid, ED     Status: Abnormal   Collection Time: 11/05/17 12:36 PM  Result Value  Ref Range   Lactic Acid, Venous 2.34 (HH) 0.5 - 1.9 mmol/L   Comment NOTIFIED PHYSICIAN   I-Stat CG4 Lactic Acid, ED     Status: None   Collection Time: 11/05/17  4:16 PM  Result Value Ref Range   Lactic Acid, Venous 0.77 0.5 - 1.9 mmol/L  I-stat troponin, ED     Status: None   Collection Time: 11/05/17  5:38 PM  Result Value Ref Range   Troponin i, poc 0.00 0.00 - 0.08 ng/mL   Comment 3            Comment: Due to the release kinetics of cTnI, a negative result within the first hours of the onset of symptoms does not rule out myocardial infarction with certainty. If myocardial infarction is still suspected, repeat the test at appropriate intervals.    Dg Chest 2 View  Result Date: 11/05/2017 CLINICAL DATA:  Dyspnea. EXAM: CHEST - 2 VIEW COMPARISON:  Radiographs of March 13, 2016. FINDINGS: The heart size and mediastinal contours are within normal limits. Both lungs are clear. No pneumothorax or pleural effusion is noted. The visualized skeletal structures are unremarkable. IMPRESSION: No active cardiopulmonary disease. Electronically Signed   By: Marijo Conception, M.D.   On: 11/05/2017 13:30    Pending Labs Unresulted Labs (From admission, onward)    Start     Ordered   11/05/17 1712  Rapid urine drug screen (hospital performed)  STAT,   R     11/05/17 1713   11/05/17 1221  Urinalysis, Routine w reflex microscopic  STAT,   STAT     11/05/17 1221   Signed and Held  HIV antibody (Routine Testing)  Once,   R     Signed and Held   Signed and Held  CBC  (enoxaparin (LOVENOX)    CrCl >/= 30 ml/min)  Once,   R    Comments:  Baseline for enoxaparin therapy IF NOT ALREADY DRAWN.  Notify MD if PLT < 100 K.    Signed and Held   Signed and Held  Creatinine, serum  (enoxaparin (LOVENOX)    CrCl >/= 30 ml/min)  Once,   R    Comments:  Baseline for  enoxaparin therapy IF NOT ALREADY DRAWN.    Signed and Held   Signed and Held  Creatinine, serum  (enoxaparin (LOVENOX)    CrCl >/= 30 ml/min)   Weekly,   R    Comments:  while on enoxaparin therapy    Signed and Held   Signed and Held  Troponin I  Now then every 6 hours,   R     Signed and Held   Signed and Held  TSH  Once,   R     Signed and Held   Signed and Held  CBC  Tomorrow morning,   R     Signed and Held   Signed and Held  Basic metabolic panel  Tomorrow morning,   R     Signed and Held          Vitals/Pain Today's Vitals   11/05/17 1358 11/05/17 1400 11/05/17 1615 11/05/17 1900  BP:  (!) 118/53  136/62  Pulse:  79  87  Resp:  13  18  Temp:      TempSrc:      SpO2: (!) 5% 100% 99% 92%  Weight:      Height:      PainSc:        Isolation Precautions No active isolations  Medications Medications  methylPREDNISolone sodium succinate (SOLU-MEDROL) 125 mg/2 mL injection 125 mg (125 mg Intravenous Given 11/05/17 1400)  albuterol (PROVENTIL) (2.5 MG/3ML) 0.083% nebulizer solution 5 mg (5 mg Nebulization Given 11/05/17 1357)  ipratropium-albuterol (DUONEB) 0.5-2.5 (3) MG/3ML nebulizer solution 3 mL (3 mLs Nebulization Given 11/05/17 1614)    Mobility walks with person assist

## 2017-11-05 NOTE — ED Triage Notes (Signed)
Patient BIB [facility] from Dr. Gabriel Carina for SOB and non-productive cough. Pt reports it started last night, went to dr for eval. Dr. Shann Medal obvious SOB while walking to room and 91% on RA. Pr reports rt side back pain 10/10 worse with deep breath. EMS reports temp of 102.0 EMS gave 1000 mg liquid tylenol.

## 2017-11-06 ENCOUNTER — Observation Stay (HOSPITAL_BASED_OUTPATIENT_CLINIC_OR_DEPARTMENT_OTHER): Payer: Medicare Other

## 2017-11-06 ENCOUNTER — Encounter (HOSPITAL_COMMUNITY): Payer: Self-pay

## 2017-11-06 DIAGNOSIS — R7989 Other specified abnormal findings of blood chemistry: Secondary | ICD-10-CM | POA: Diagnosis not present

## 2017-11-06 DIAGNOSIS — F1920 Other psychoactive substance dependence, uncomplicated: Secondary | ICD-10-CM

## 2017-11-06 DIAGNOSIS — E872 Acidosis: Secondary | ICD-10-CM | POA: Diagnosis not present

## 2017-11-06 DIAGNOSIS — N3010 Interstitial cystitis (chronic) without hematuria: Secondary | ICD-10-CM | POA: Diagnosis not present

## 2017-11-06 DIAGNOSIS — I340 Nonrheumatic mitral (valve) insufficiency: Secondary | ICD-10-CM

## 2017-11-06 DIAGNOSIS — I361 Nonrheumatic tricuspid (valve) insufficiency: Secondary | ICD-10-CM

## 2017-11-06 DIAGNOSIS — I10 Essential (primary) hypertension: Secondary | ICD-10-CM

## 2017-11-06 DIAGNOSIS — I2510 Atherosclerotic heart disease of native coronary artery without angina pectoris: Secondary | ICD-10-CM | POA: Diagnosis not present

## 2017-11-06 DIAGNOSIS — N179 Acute kidney failure, unspecified: Secondary | ICD-10-CM

## 2017-11-06 LAB — CBC
HCT: 35.9 % — ABNORMAL LOW (ref 36.0–46.0)
Hemoglobin: 12.2 g/dL (ref 12.0–15.0)
MCH: 29.5 pg (ref 26.0–34.0)
MCHC: 34 g/dL (ref 30.0–36.0)
MCV: 86.7 fL (ref 78.0–100.0)
PLATELETS: 213 10*3/uL (ref 150–400)
RBC: 4.14 MIL/uL (ref 3.87–5.11)
RDW: 12.9 % (ref 11.5–15.5)
WBC: 12.6 10*3/uL — AB (ref 4.0–10.5)

## 2017-11-06 LAB — BASIC METABOLIC PANEL
ANION GAP: 8 (ref 5–15)
BUN: 19 mg/dL (ref 8–23)
CALCIUM: 9.6 mg/dL (ref 8.9–10.3)
CO2: 25 mmol/L (ref 22–32)
Chloride: 107 mmol/L (ref 98–111)
Creatinine, Ser: 1.15 mg/dL — ABNORMAL HIGH (ref 0.44–1.00)
GFR, EST AFRICAN AMERICAN: 58 mL/min — AB (ref >60)
GFR, EST NON AFRICAN AMERICAN: 50 mL/min — AB (ref >60)
Glucose, Bld: 152 mg/dL — ABNORMAL HIGH (ref 70–99)
Potassium: 3.6 mmol/L (ref 3.5–5.1)
Sodium: 140 mmol/L (ref 135–145)

## 2017-11-06 LAB — ECHOCARDIOGRAM COMPLETE
HEIGHTINCHES: 70 in
Weight: 2427.2 oz

## 2017-11-06 LAB — TROPONIN I: Troponin I: <0.03 ng/mL (ref <0.03)

## 2017-11-06 LAB — HIV ANTIBODY (ROUTINE TESTING W REFLEX): HIV SCREEN 4TH GENERATION: NONREACTIVE

## 2017-11-06 MED ORDER — PREDNISONE 10 MG PO TABS: 10.0000 mg | ORAL_TABLET | Freq: Every day | ORAL | 0 refills | Status: DC

## 2017-11-06 MED ORDER — POLYETHYLENE GLYCOL 3350 17 G PO PACK: 17.0000 g | PACK | Freq: Every day | ORAL | 0 refills | Status: DC | PRN

## 2017-11-06 MED ORDER — IPRATROPIUM-ALBUTEROL 0.5-2.5 (3) MG/3ML IN SOLN
3.0000 mL | Freq: Four times a day (QID) | RESPIRATORY_TRACT | Status: DC
  Administered 2017-11-06: 3 mL via RESPIRATORY_TRACT
  Filled 2017-11-06: qty 3

## 2017-11-06 MED ORDER — IPRATROPIUM-ALBUTEROL 0.5-2.5 (3) MG/3ML IN SOLN: 3.0000 mL | RESPIRATORY_TRACT | Status: DC | PRN

## 2017-11-06 MED ORDER — HYDROXYZINE HCL 50 MG/ML IM SOLN
25.0000 mg | Freq: Four times a day (QID) | INTRAMUSCULAR | Status: DC | PRN
  Filled 2017-11-06: qty 0.5

## 2017-11-06 MED ORDER — METHYLPREDNISOLONE SODIUM SUCC 125 MG IJ SOLR
60.0000 mg | Freq: Two times a day (BID) | INTRAMUSCULAR | Status: DC
  Administered 2017-11-06: 60 mg via INTRAVENOUS
  Filled 2017-11-06: qty 2

## 2017-11-06 MED ORDER — IPRATROPIUM-ALBUTEROL 0.5-2.5 (3) MG/3ML IN SOLN: 3.0000 mL | Freq: Three times a day (TID) | RESPIRATORY_TRACT | Status: DC

## 2017-11-06 MED ORDER — LACTULOSE 10 GM/15ML PO SOLN
20.0000 g | ORAL | Status: AC
  Administered 2017-11-06: 20 g via ORAL
  Filled 2017-11-06: qty 30

## 2017-11-06 MED ORDER — HYDROXYZINE PAMOATE 25 MG PO CAPS: 25.0000 mg | ORAL_CAPSULE | Freq: Three times a day (TID) | ORAL | 0 refills | Status: DC | PRN

## 2017-11-06 NOTE — Discharge Summary (Signed)
Physician Discharge Summary  Laurie [name] YKD:[id number] DOB: 10-03-1955 DOA: 11/05/2017  PCP: [name], MD  Admit date: 11/05/2017 Discharge date: 11/06/2017  Admitted From: Home Disposition:  Home  Recommendations for Outpatient Follow-up:  1. Follow up with PCP in 2-3 weeks  Discharge Condition:Improved CODE STATUS:Full Diet recommendation: Heart healthy   Brief/Interim Summary: 62 y.o. female with medical history significant for tobacco use, depression, hypertension, CAD and arthritis who presented with shortness of breath. Patient presented with worsening shortness of breath for 2 days.  She also reports back pain between her shoulder blades that she describes as sharp.  Back pain has eased off.  She also reported lightheadedness, fatigue, popping in the ear and chills.  She denies URI symptoms, cough, chest pain, orthopnea, PND, leg swelling, nausea, vomiting, diarrhea or dysuria.  She thinks she might have been constipated.  She does remember her last bowel movement.  She denies dysuria but states that it is hard to go.   She lives with her boyfriend.  She admits to smoking tobacco,  marijuana and cocaine use.  Last cocaine use 2 days ago.  In ED, vital signs within normal limits.  She was put on 2 L by nasal cannula.  Labs significant for serum creatinine to 1.31 (baseline 0.8-1), BNP 03/16/2025 and lactic acid to 2.34 that has trended to 0.77.  EKG with some T wave changes in inferior lead.  Chest x-ray without acute cardiopulmonary finding.  She was given DuoNeb x2 and Solu-Medrol. I was called to admit patient for increased work of breathing and possible COPD exacerbation.  Dyspnea:  -Without formal hx of COPD but significant history of tobacco use.  Also marijuana and cocaine use which could contribute.  BNP elevated to 326 concerning for some heart strain.  Started on Solu-Medrol and DuoNeb in ED for presumed COPD. -Clinically improved overnight and able to wean to  room air -Will complete prednisone wean on discharge  Elevated BNP: No history of CHF but history of negative artery CAD in her chart.  Echo in 2014 with EF of 60-65 no significant structural or functional abnormality.  Appears to be euvolemic on exam.  Chest x-ray without pulmonary edema. -2d echo ordered, reviewed and found to be unremarkable with normal LVEF, no wall motion abnormality -Patient with history of interstitial cystitis.  Polysubstance use: admits marijuana and cocaine use.  EKG with T wave changes in inferior leads.  Troponin negative -Cessation done at bedside  AKI: likely prerenal. On ARB and HCTZ as well -Held home BP meds overnight -Cr improved  Lactic acidosis: Resolved in ED  Constipation:  -given Miralax without results -given follow up lactulose.  Discharge Diagnoses:  Active Problems:   Essential hypertension   INTERSTITIAL CYSTITIS   Shortness of breath   Elevated brain natriuretic peptide (BNP) level   Polysubstance (excluding opioids) dependence, daily use (HCC)   AKI (acute kidney injury) ([place])    Discharge Instructions   Allergies as of 11/06/2017      Reactions   Codeine    REACTION: Vomiting   Lisinopril Cough   Sulfonamide Derivatives    REACTION: Upset GI   Tetracyclines & Related Rash      Medication List    STOP taking these medications   losartan-hydrochlorothiazide 100-12.5 MG tablet Commonly known as:  HYZAAR   oxyCODONE-acetaminophen 5-325 MG tablet Commonly known as:  PERCOCET/ROXICET   pantoprazole 40 MG tablet Commonly known as:  PROTONIX     TAKE these medications  albuterol 108 (90 Base) MCG/ACT inhaler Commonly known as:  PROVENTIL HFA;VENTOLIN HFA Inhale 2 puffs into the lungs every 6 (six) hours as needed for wheezing or shortness of breath.   amitriptyline 10 MG tablet Commonly known as:  ELAVIL Take 1 tablet (10 mg total) by mouth at bedtime.   hydrOXYzine 25 MG capsule Commonly known as:   VISTARIL Take 1 capsule (25 mg total) by mouth 3 (three) times daily as needed.   MUCINEX FAST-MAX CONGEST COUGH PO Take 1 tablet by mouth daily as needed (cough).   polyethylene glycol packet Commonly known as:  MIRALAX / GLYCOLAX Take 17 g by mouth daily as needed for mild constipation.   predniSONE 10 MG tablet Commonly known as:  DELTASONE Take 1 tablet (10 mg total) by mouth daily. Taper dose: 40mg  PO daily x 2 days, then 20 mg PO daily x 2 days, then 10mg  po daily x 2 days, 5mg  PO daily x 2 days, then stop. Zero refills   venlafaxine XR 150 MG 24 hr capsule Commonly known as:  EFFEXOR-XR TAKE ONE CAPSULE BY MOUTH DAILY   Vitamin D3 50000 units Caps TAKE 1 CAPSULE BY MOUTH ONCE WEEKLY What changed:  Another medication with the same name was removed. Continue taking this medication, and follow the directions you see here.       Allergies  Allergen Reactions  . Codeine     REACTION: Vomiting  . Lisinopril Cough  . Sulfonamide Derivatives     REACTION: Upset GI  . Tetracyclines & Related Rash    Procedures/Studies: Dg Chest 2 View  Result Date: 11/05/2017 CLINICAL DATA:  Dyspnea. EXAM: CHEST - 2 VIEW COMPARISON:  Radiographs of March 13, 2016. FINDINGS: The heart size and mediastinal contours are within normal limits. Both lungs are clear. No pneumothorax or pleural effusion is noted. The visualized skeletal structures are unremarkable. IMPRESSION: No active cardiopulmonary disease. Electronically Signed   By: Marijo Conception, M.D.   On: 11/05/2017 13:30    Subjective: Reports breathing better today  Discharge Exam: Vitals:   11/05/17 2226 11/06/17 0525  BP:  (!) 125/51  Pulse:  97  Resp:  (!) 22  Temp:  99.4 F (37.4 C)  SpO2: 92% 90%   Vitals:   11/05/17 1930 11/05/17 2024 11/05/17 2226 11/06/17 0525  BP: (!) 132/57 (!) 150/59  (!) 125/51  Pulse: 85 88  97  Resp:  (!) 22  (!) 22  Temp:  98.8 F (37.1 C)  99.4 F (37.4 C)  TempSrc:  Oral  Oral   SpO2: 93% 90% 92% 90%  Weight:  68.4 kg  68.8 kg  Height:        General: Pt is alert, awake, not in acute distress Cardiovascular: RRR, S1/S2 +, no rubs, no gallops Respiratory: CTA bilaterally, no wheezing, no rhonchi Abdominal: Soft, NT, ND, bowel sounds + Extremities: no edema, no cyanosis   The results of significant diagnostics from this hospitalization (including imaging, microbiology, ancillary and laboratory) are listed below for reference.     Microbiology: No results found for this or any previous visit (from the past 240 hour(s)).   Labs: BNP (last 3 results) Recent Labs    11/05/17 1230  BNP 409.8*   Basic Metabolic Panel: Recent Labs  Lab 11/05/17 1230 11/05/17 2047 11/06/17 0157  NA 143  --  140  K 3.5  --  3.6  CL 105  --  107  CO2 27  --  25  GLUCOSE 125*  --  152*  BUN 17  --  19  CREATININE 1.31* 1.25* 1.15*  CALCIUM 9.4  --  9.6   Liver Function Tests: Recent Labs  Lab 11/05/17 1230  AST 21  ALT 13  ALKPHOS 61  BILITOT 0.8  PROT 7.0  ALBUMIN 3.7   No results for input(s): LIPASE, AMYLASE in the last 168 hours. No results for input(s): AMMONIA in the last 168 hours. CBC: Recent Labs  Lab 11/05/17 1230 11/05/17 2047 11/06/17 0157  WBC 9.7 8.5 12.6*  NEUTROABS 8.2*  --   --   HGB 12.2 13.1 12.2  HCT 36.4 40.1 35.9*  MCV 88.6 87.9 86.7  PLT 195 224 213   Cardiac Enzymes: Recent Labs  Lab 11/05/17 2047 11/06/17 0157 11/06/17 0757  TROPONINI <0.03 <0.03 <0.03   BNP: Invalid input(s): POCBNP CBG: No results for input(s): GLUCAP in the last 168 hours. D-Dimer No results for input(s): DDIMER in the last 72 hours. Hgb A1c No results for input(s): HGBA1C in the last 72 hours. Lipid Profile No results for input(s): CHOL, HDL, LDLCALC, TRIG, CHOLHDL, LDLDIRECT in the last 72 hours. Thyroid function studies Recent Labs    11/05/17 2047  TSH 0.363   Anemia work up No results for input(s): VITAMINB12, FOLATE, FERRITIN,  TIBC, IRON, RETICCTPCT in the last 72 hours. Urinalysis    Component Value Date/Time   COLORURINE YELLOW 11/05/2017 1221   APPEARANCEUR CLEAR 11/05/2017 1221   LABSPEC 1.011 11/05/2017 1221   PHURINE 5.0 11/05/2017 1221   GLUCOSEU NEGATIVE 11/05/2017 1221   GLUCOSEU NEGATIVE 11/04/2011 1127   HGBUR SMALL (A) 11/05/2017 1221   HGBUR negative 01/01/2010 1055   BILIRUBINUR NEGATIVE 11/05/2017 1221   BILIRUBINUR neg 07/18/2017 0753   KETONESUR NEGATIVE 11/05/2017 1221   PROTEINUR NEGATIVE 11/05/2017 1221   UROBILINOGEN 0.2 07/18/2017 0753   UROBILINOGEN 0.2 11/04/2011 1127   NITRITE NEGATIVE 11/05/2017 1221   LEUKOCYTESUR NEGATIVE 11/05/2017 1221   Sepsis Labs Invalid input(s): PROCALCITONIN,  WBC,  LACTICIDVEN Microbiology No results found for this or any previous visit (from the past 240 hour(s)).  Time spent: 30 min  SIGNED:   Marylu Lund, MD  [facility] 11/06/2017, 2:30 PM  If 7PM-7AM, please contact night-coverage

## 2017-11-06 NOTE — Progress Notes (Signed)
  Echocardiogram 2D Echocardiogram has been performed.  Laurie [name] 11/06/2017, 12:32 PM

## 2017-11-06 NOTE — Progress Notes (Signed)
SATURATION QUALIFICATIONS: (This note is used to comply with regulatory documentation for home oxygen)  Patient Saturations on Room Air at Rest = 90%  Patient Saturations on Room Air while Ambulating = 88% then stayed at 90%  Patient Saturations on 0 Liters of oxygen while Ambulating = 90% RA  Please briefly explain why patient needs home oxygen: Patient saturations while ambulating maintained at 90%

## 2017-11-07 ENCOUNTER — Telehealth: Payer: Self-pay | Admitting: Family Medicine

## 2017-11-07 NOTE — Telephone Encounter (Signed)
Unable to reach patient at time of TCM Call. Left message for patient to return call when available.  

## 2017-11-11 ENCOUNTER — Telehealth: Payer: Self-pay | Admitting: Family Medicine

## 2017-11-11 NOTE — Telephone Encounter (Signed)
Called patient and gave her the message and made an appointment for Monday at 1:45 pm. Patient verbalized an understanding.

## 2017-11-11 NOTE — Telephone Encounter (Signed)
Copied from [place] [id number]. Topic: Quick Communication - Rx Refill/Question >> Nov 11, 2017  1:02 PM Sheran Luz wrote: Medication:  losartan (COZAAR) 100 MG tablet  Pt states that she was seen in ED 9/25 and was advised to stop taking this medication without further explanation. Pt would like to know if Dr. Elease Hashimoto recommends that she continue to take it. Please advise.

## 2017-11-11 NOTE — Telephone Encounter (Signed)
I would hold until hospital follow up.

## 2017-11-11 NOTE — Telephone Encounter (Signed)
Please advise 

## 2017-11-14 ENCOUNTER — Encounter: Payer: Self-pay | Admitting: Family Medicine

## 2017-11-14 ENCOUNTER — Ambulatory Visit (INDEPENDENT_AMBULATORY_CARE_PROVIDER_SITE_OTHER): Payer: Medicare Other

## 2017-11-14 ENCOUNTER — Ambulatory Visit (INDEPENDENT_AMBULATORY_CARE_PROVIDER_SITE_OTHER): Payer: Medicare Other | Admitting: Family Medicine

## 2017-11-14 ENCOUNTER — Other Ambulatory Visit: Payer: Self-pay

## 2017-11-14 VITALS — BP 126/70 | HR 73 | Temp 98.2°F | Wt 155.1 lb

## 2017-11-14 DIAGNOSIS — R300 Dysuria: Secondary | ICD-10-CM

## 2017-11-14 DIAGNOSIS — F1920 Other psychoactive substance dependence, uncomplicated: Secondary | ICD-10-CM

## 2017-11-14 DIAGNOSIS — S8991XA Unspecified injury of right lower leg, initial encounter: Secondary | ICD-10-CM | POA: Diagnosis not present

## 2017-11-14 DIAGNOSIS — S99921A Unspecified injury of right foot, initial encounter: Secondary | ICD-10-CM | POA: Diagnosis not present

## 2017-11-14 DIAGNOSIS — M25561 Pain in right knee: Secondary | ICD-10-CM

## 2017-11-14 DIAGNOSIS — M79671 Pain in right foot: Secondary | ICD-10-CM | POA: Diagnosis not present

## 2017-11-14 DIAGNOSIS — I10 Essential (primary) hypertension: Secondary | ICD-10-CM

## 2017-11-14 DIAGNOSIS — R7989 Other specified abnormal findings of blood chemistry: Secondary | ICD-10-CM | POA: Diagnosis not present

## 2017-11-14 LAB — POCT URINALYSIS DIPSTICK
BILIRUBIN UA: NEGATIVE
Clarity, UA: NEGATIVE
Glucose, UA: NEGATIVE
Ketones, UA: NEGATIVE
LEUKOCYTES UA: NEGATIVE
NITRITE UA: NEGATIVE
PH UA: 6 (ref 5.0–8.0)
PROTEIN UA: NEGATIVE
RBC UA: NEGATIVE
Spec Grav, UA: 1.025 (ref 1.010–1.025)
UROBILINOGEN UA: 0.2 U/dL

## 2017-11-14 NOTE — Patient Instructions (Signed)
Continue to hold the Losartan HCTZ

## 2017-11-14 NOTE — Progress Notes (Signed)
Subjective:     Patient ID: Laurie [name], female   DOB: 07/13/1955, 62 y.o.   MRN: [id number]  HPI Patient is seen for hospital follow-up.  She had presented here with some chest pain and dyspnea and was admitted for further evaluation.  She also had some mild hypoxia.  She was placed on 2 L in the ER had chest x-ray which showed no acute findings.  EKG showed some T wave changes inferior leads.  She had BNP level over 300.  No history of known heart failure.  She was admitted with possible COPD exacerbation.  She did not start smoking though until around age 45.  She had admitted to marijuana use and one-time cocaine use couple days prior to admission but denies any regular cocaine use.  She was discharged on prednisone.  She is had difficulty sleeping since taking the prednisone.  Echocardiogram no heart failure.  Her troponins were all negative.  She had been on losartan HCTZ and this was held and she continues to hold at this time.  Her blood pressures been stable.  She has history of fibromyalgia and interstitial cystitis and complains of chronic fatigue issues.  She complains of some dysuria.  She said some dark colored urine and intermittent burning with urination.  No gross hematuria.  As above, history of reported interstitial cystitis.  Patient complains of bilateral knee pain for months if not years right slightly greater than left.  No recent injury.  Early morning stiffness.  She also complains of right foot pain.  She states she has had some swelling and pain in the region of the second metatarsal.  Denies any injury whatsoever.  Pain with ambulation.  Past Medical History:  Diagnosis Date  . Allergy   . Anxiety   . Arthritis    RA  . Asthma   . Chronic back pain   . Depression   . Esophageal stricture   . GERD (gastroesophageal reflux disease)   . Heart murmur   . Hyperlipidemia   . Hypertension   . IBS (irritable bowel syndrome)   . Interstitial cystitis   .  Neuromuscular disorder ([place])    fibromyalgia   Past Surgical History:  Procedure Laterality Date  . ABDOMINAL HYSTERECTOMY    . APPENDECTOMY    . CERVICAL FUSION    . CERVICAL LAMINECTOMY    . ESOPHAGOGASTRODUODENOSCOPY    . FINGER SURGERY    . LUMBAR FUSION    . TONSILLECTOMY      reports that she has been smoking cigarettes. She has been smoking about 0.50 packs per day. She has never used smokeless tobacco. She reports that she does not drink alcohol or use drugs. family history includes Aneurysm in her brother; Cancer in her brother; Coronary artery disease in her father, paternal aunt, and paternal grandmother; Dementia in her mother; Esophageal cancer in her brother; Heart attack in her father; Stomach cancer in her paternal aunt. Allergies  Allergen Reactions  . Codeine     REACTION: Vomiting  . Lisinopril Cough  . Sulfonamide Derivatives     REACTION: Upset GI  . Tetracyclines & Related Rash     Review of Systems  Constitutional: Positive for fatigue.  Respiratory: Negative for cough and shortness of breath.   Cardiovascular: Negative for chest pain and leg swelling.  Gastrointestinal: Negative for abdominal pain.  Genitourinary: Positive for dysuria.  Musculoskeletal: Positive for arthralgias.  Neurological: Negative for headaches.       Objective:  Physical Exam  Constitutional: She is oriented to person, place, and time. She appears well-developed and well-nourished.  Neck: Neck supple.  Cardiovascular: Normal rate and regular rhythm.  Pulmonary/Chest: Effort normal and breath sounds normal.  Musculoskeletal: She exhibits no edema.  Lymphadenopathy:    She has no cervical adenopathy.  Neurological: She is alert and oriented to person, place, and time.       Assessment:     #1 recent dyspnea.  Possible COPD exacerbation.  Patient overall improved on prednisone.  #2 recent mildly elevated BNP level.  No history of systolic failure.  No evidence for  pulmonary edema  #3 history of polysubstance use.  She denies any regular cocaine use.  She has used marijuana frequently in the past.  #4 hypertension stable currently off medication  #5 recent acute kidney injury and creatinine was improved prior to discharge  #6 right foot pain.-Does have some tenderness right second metatarsal midshaft to distal with mild swelling.  #7 bilateral knee pain right greater than left.  No injury. ?degenerative.    Plan:     -We had a long discussion regarding substance abuse issues.  She denies regular cocaine use. -Check urine dipstick regarding her recent complaints of dysuria. (Urine dip unremarkable) -Continue to hold losartan HCTZ at this time as her blood pressures very well controlled today. -Advised nicotine cessation -Obtain x-rays right foot and right knee -Dental follow-up in 1 month to reassess blood pressure and follow-up multiple issues above  [name] MD [facility]

## 2017-11-17 ENCOUNTER — Ambulatory Visit: Payer: Medicare Other | Admitting: Family Medicine

## 2017-11-19 ENCOUNTER — Telehealth: Payer: Self-pay | Admitting: *Deleted

## 2017-11-19 NOTE — Telephone Encounter (Signed)
Copied from [place]. Topic: Quick Communication - Other Results >> Nov 19, 2017  9:03 AM Scherrie Gerlach wrote: Pt would like results of Xray done 10/04. Pt states hurt foot is still swollen and painful.

## 2017-11-19 NOTE — Telephone Encounter (Signed)
X-rays revealed no fracture.  I would like for her to try icing 15-20 minutes three times daily.  Would not recommend prolonged steroid use. Touch base if not improving in 2 weeks with the icing.

## 2017-11-19 NOTE — Telephone Encounter (Signed)
Called patient and gave her xray results. Patient verbalized an understanding.  Patient stated that her foot is still swollen and pain in same area. Patient asked if her Prednisone could be refilled? She stated that this helped her greatly.  Patient also would like to know does she need a referral for her foot?  Please advise.

## 2017-11-19 NOTE — Telephone Encounter (Signed)
Called patient and gave her message from Dr. Elease Hashimoto. Patient verbalized an understanding.

## 2017-12-01 ENCOUNTER — Other Ambulatory Visit: Payer: Self-pay

## 2017-12-01 NOTE — Patient Outreach (Signed)
[facility]) Care Management  12/01/2017  Laurie [name] 06-02-1955 [id number]   Medication Adherence call to Laurie [name] left a message for patient to call back patient is due on Losartan / HCTZ 100/12.5 mg. Laurie [name] is showing past due under [place].   [facility] Direct Dial [phone]  Fax [phone] [email or web address]

## 2017-12-31 ENCOUNTER — Ambulatory Visit: Payer: Self-pay | Admitting: *Deleted

## 2017-12-31 ENCOUNTER — Telehealth: Payer: Self-pay | Admitting: Family Medicine

## 2017-12-31 NOTE — Telephone Encounter (Signed)
Noted. Agree with advice.  

## 2017-12-31 NOTE — Telephone Encounter (Signed)
FYI

## 2017-12-31 NOTE — Telephone Encounter (Signed)
  Pt reports SOB, onset yesterday. Seen in ED 11/05/17 for similar symptoms, RX prednisone which she completed. Originally called to request refill of prednisone, mentioned SOB resulting in triage.  Pt reports "Feels like I'm smothering."  States SOB at rest, worse with exertion. Denies any chest pain, dizziness, no congestion or cold symptoms.  Pt speaking in one word answers, short phrases. No wheezing. Pt directed to ED, family will drive. Reason for Disposition . [1] MODERATE difficulty breathing (e.g., speaks in phrases, SOB even at rest, pulse 100-120) AND [2] NEW-onset or WORSE than normal  Answer Assessment - Initial Assessment Questions 1. RESPIRATORY STATUS: "Describe your breathing?" (e.g., wheezing, shortness of breath, unable to speak, severe coughing)      SOB at rest, worse with exertion 2. ONSET: "When did this breathing problem begin?"      Yesterday 3. PATTERN "Does the difficult breathing come and go, or has it been constant since it started?"      Constant 4. SEVERITY: "How bad is your breathing?" (e.g., mild, moderate, severe)    - MILD: No SOB at rest, mild SOB with walking, speaks normally in sentences, can lay down, no retractions, pulse < 100.    - MODERATE: SOB at rest, SOB with minimal exertion and prefers to sit, cannot lie down flat, speaks in phrases, mild retractions, audible wheezing, pulse 100-120.    - SEVERE: Very SOB at rest, speaks in single words, struggling to breathe, sitting hunched forward, retractions, pulse > 120     "Feels like I'm suffocating." 5. RECURRENT SYMPTOM: "Have you had difficulty breathing before?" If so, ask: "When was the last time?" and "What happened that time?"      In ED 11/05/17, unsure of cause per patient 6. CARDIAC HISTORY: "Do you have any history of heart disease?" (e.g., heart attack, angina, bypass surgery, angioplasty)      HTN  CAD 7. LUNG HISTORY: "Do you have any history of lung disease?"  (e.g., pulmonary embolus, asthma,  emphysema)     Smoker 8. CAUSE: "What do you think is causing the breathing problem?"      "They never told me." 9. OTHER SYMPTOMS: "Do you have any other symptoms? (e.g., dizziness, runny nose, cough, chest pain, fever)     No  Protocols used: BREATHING DIFFICULTY-A-AH

## 2017-12-31 NOTE — Telephone Encounter (Signed)
Copied from [place] [phone] [phone]. Topic: Quick Communication - Rx Refill/Question >> Dec 31, 2017  2:45 PM Yvette Rack wrote: Medication: predniSONE (DELTASONE) 10 MG tablet  pt states that it was given to her because  she had a hard time breathing and its hard for her to breath now and need this medicine  Has the patient contacted their pharmacy? No.  The hospital gave pt this medicine (Agent: If no, request that the patient contact the pharmacy for the refill.) (Agent: If yes, when and what did the pharmacy advise?) no more refills on medicine  Preferred Pharmacy (with phone number or street name):     [place], [place] [phone] (Phone) [phone] (Fax)    Agent: Please be advised that RX refills may take up to 3 business days. We ask that you follow-up with your pharmacy.

## 2018-01-01 ENCOUNTER — Ambulatory Visit: Payer: Self-pay

## 2018-01-01 NOTE — Telephone Encounter (Signed)
See triage encounter.

## 2018-01-01 NOTE — Telephone Encounter (Signed)
Patient called in requesting Prednisone refilled. I called and spoke to the patient about that call on yesterday. She says "well, yesterday I was having a hard time breathing and I wanted the Prednisone because it has helped me in the past. I had it last when I was discharged from the hospital and it helps me overall, my breathing and my aches in my back." I asked about other symptoms, she says "my chest was hurting and tight when I was coughing yesterday, but I'm not hurting now and not coughing." According to protocol, see PCP within 2 weeks, appointment scheduled for tomorrow at 1540 with Dr. Elease Hashimoto, care advice given, patient verbalized understanding.   Reason for Disposition . [1] MILD longstanding difficulty breathing AND [2]  SAME as normal  Answer Assessment - Initial Assessment Questions 1. RESPIRATORY STATUS: "Describe your breathing?" (e.g., wheezing, shortness of breath, unable to speak, severe coughing)      I'm fine now 2. ONSET: "When did this breathing problem begin?"      I was having trouble yesterday 3. PATTERN "Does the difficult breathing come and go, or has it been constant since it started?"      Come and go 4. SEVERITY: "How bad is your breathing?" (e.g., mild, moderate, severe)    - MILD: No SOB at rest, mild SOB with walking, speaks normally in sentences, can lay down, no retractions, pulse < 100.    - MODERATE: SOB at rest, SOB with minimal exertion and prefers to sit, cannot lie down flat, speaks in phrases, mild retractions, audible wheezing, pulse 100-120.    - SEVERE: Very SOB at rest, speaks in single words, struggling to breathe, sitting hunched forward, retractions, pulse > 120      Not SOB right now 5. RECURRENT SYMPTOM: "Have you had difficulty breathing before?" If so, ask: "When was the last time?" and "What happened that time?"     Yes; I was put on prednisone 6. CARDIAC HISTORY: "Do you have any history of heart disease?" (e.g., heart attack, angina,  bypass surgery, angioplasty)      No 7. LUNG HISTORY: "Do you have any history of lung disease?"  (e.g., pulmonary embolus, asthma, emphysema)     Yes 8. CAUSE: "What do you think is causing the breathing problem?"      I don't know 9. OTHER SYMPTOMS: "Do you have any other symptoms? (e.g., dizziness, runny nose, cough, chest pain, fever)     I had chest pain when I coughed and tightness 10. PREGNANCY: "Is there any chance you are pregnant?" "When was your last menstrual period?"       No 11. TRAVEL: "Have you traveled out of the country in the last month?" (e.g., travel history, exposures)       No  Protocols used: BREATHING DIFFICULTY-A-AH

## 2018-01-01 NOTE — Telephone Encounter (Signed)
Attempted to contact pt. To discuss sx's.  Left message with pt's sister, to have pt. Call office to speak with Triage nurse.  Sister verb. Understanding.

## 2018-01-02 ENCOUNTER — Ambulatory Visit: Payer: Medicare Other | Admitting: Family Medicine

## 2018-01-02 ENCOUNTER — Telehealth: Payer: Self-pay | Admitting: *Deleted

## 2018-01-02 NOTE — Telephone Encounter (Signed)
01/02/2018-I called the pt and she stated she has to cancel her appt due to keeping her grandchild and was not seen in the ER today and was given Prednisone before and requested a refill now on this due to congestion and pain.  Message sent to Dr Elease Hashimoto. Wendie Simmer     Copied from [place] [phone]. Topic: General - Other >> Jan 02, 2018  1:00 PM Judyann Munson wrote: Reason for CRM: patient called and cancel her appt with Dr. Elease Hashimoto  for today. Thr patient was seen at the ED and was given Prednisone. She is requesting to have a  medication refill. She is not experience any symptoms today. Please advise

## 2018-01-02 NOTE — Telephone Encounter (Signed)
Needs to be seen for prednisone.  ?Sat AM clinic.

## 2018-01-02 NOTE — Telephone Encounter (Signed)
I called the pt and informed her of the message below.  I advised to call our office number and ask for an appt on Saturday as I do not know how to schedule this.  She stated she is not sure about transportation for this, I offered to schedule an appt for next week and she stated she will call back for an appt.

## 2018-03-23 ENCOUNTER — Other Ambulatory Visit: Payer: Self-pay | Admitting: Family Medicine

## 2018-06-07 ENCOUNTER — Other Ambulatory Visit: Payer: Self-pay | Admitting: Family Medicine

## 2018-06-09 NOTE — Telephone Encounter (Signed)
Tried to call patient to schedule a follow up appointment for medication refills and not able to leave a voice message. I will try back at a later time.

## 2018-06-10 ENCOUNTER — Other Ambulatory Visit: Payer: Self-pay

## 2018-06-10 ENCOUNTER — Ambulatory Visit (INDEPENDENT_AMBULATORY_CARE_PROVIDER_SITE_OTHER): Payer: Medicare Other | Admitting: Family Medicine

## 2018-06-10 DIAGNOSIS — E559 Vitamin D deficiency, unspecified: Secondary | ICD-10-CM | POA: Diagnosis not present

## 2018-06-10 DIAGNOSIS — M797 Fibromyalgia: Secondary | ICD-10-CM

## 2018-06-10 DIAGNOSIS — F339 Major depressive disorder, recurrent, unspecified: Secondary | ICD-10-CM | POA: Diagnosis not present

## 2018-06-10 MED ORDER — VITAMIN D3 1.25 MG (50000 UT) PO CAPS: 1.0000 | ORAL_CAPSULE | ORAL | 3 refills | Status: DC

## 2018-06-10 MED ORDER — DULOXETINE HCL 60 MG PO CPEP: 60.0000 mg | ORAL_CAPSULE | Freq: Every day | ORAL | 3 refills | Status: DC

## 2018-06-10 NOTE — Progress Notes (Signed)
Patient ID: Laurie [name], female   DOB: 25-Feb-1955, 63 y.o.   MRN: [id number]  This visit type was conducted due to national recommendations for restrictions regarding the COVID-19 pandemic in an effort to limit this patient's exposure and mitigate transmission in our community.   Virtual Visit via Telephone Note  I connected with [place] on 06/10/18 at  4:45 PM EDT by telephone and verified that I am speaking with the correct person using two identifiers.   I discussed the limitations, risks, security and privacy concerns of performing an evaluation and management service by telephone and the availability of in person appointments. I also discussed with the patient that there may be a patient responsible charge related to this service. The patient expressed understanding and agreed to proceed.  Location patient: home Location provider: work or home office Participants present for the call: patient, provider Patient did not have a visit in the prior 7 days to address this/these issue(s).   History of Present Illness:  Patient had called requesting refills of Effexor.  She has history of recurrent depression.  Patient states her pharmacy has been trying to contact us for several days but today was the first I had heard of this request.  She ran out of Effexor on Friday.  As expected, she had withdrawal symptoms over the weekend.  She states 1 of her friends was on Cymbalta and they knew these drugs were very similar chemically and she took 60 mg Cymbalta which did help her with withdrawal symptoms.  Patient feels her depression is relatively stable.  She states she has "fibromyalgia ".  Patient is inquiring whether she could be transitioned over to Cymbalta.  She did not seem to have any side effects.  Patient also has history of interstitial cystitis.  She has chronic back pain.  History of vitamin D deficiency.  She has been on 50,000 international unit vitamin D.  Recently ran out  and requesting refills.  She states she felt less fatigued when taking vitamin D regularly   Observations/Objective: Patient sounds cheerful and well on the phone. I do not appreciate any SOB. Speech and thought processing are grossly intact. Patient reported vitals:  Assessment and Plan: #1 history of recurrent depression currently stable.  Recent withdrawal symptoms after acutely running out of Effexor  -We discussed possible change to Cymbalta given her history of fibromyalgia and chronic back pain.  She seemed to do well on this over the weekend.  We sent a prescription for Cymbalta 60 mg once daily.  She will give [place] some feedback over the next few weeks how things are going  #2 history of vitamin D deficiency -Refilled vitamin D 50,000 international units once weekly  #3 history of reported fibromyalgia. -Trial of Cymbalta as above  Follow Up Instructions:  3 to 4 months for office follow-up  I did not refer this patient for an OV in the next 24 hours for this/these issue(s).  I discussed the assessment and treatment plan with the patient. The patient was provided an opportunity to ask questions and all were answered. The patient agreed with the plan and demonstrated an understanding of the instructions.   The patient was advised to call back or seek an in-person evaluation if the symptoms worsen or if the condition fails to improve as anticipated   [name], MD

## 2018-06-16 NOTE — Telephone Encounter (Signed)
Did you stop this medication for this patient? Not currently on med list.

## 2018-06-16 NOTE — Telephone Encounter (Signed)
We stop the effexor and switched to Cymbalta.  Do not refill Effexor.

## 2018-09-03 ENCOUNTER — Encounter: Payer: Self-pay | Admitting: Gastroenterology

## 2018-09-09 ENCOUNTER — Emergency Department (HOSPITAL_COMMUNITY): Payer: Medicare Other

## 2018-09-09 ENCOUNTER — Inpatient Hospital Stay (HOSPITAL_COMMUNITY)
Admission: EM | Admit: 2018-09-09 | Discharge: 2018-09-18 | DRG: 369 | Disposition: A | Discharge status: Home / Self Care | Payer: Medicare Other | Attending: Internal Medicine | Admitting: Internal Medicine

## 2018-09-09 ENCOUNTER — Other Ambulatory Visit: Payer: Self-pay

## 2018-09-09 ENCOUNTER — Encounter (HOSPITAL_COMMUNITY): Payer: Self-pay | Admitting: Emergency Medicine

## 2018-09-09 DIAGNOSIS — Z79899 Other long term (current) drug therapy: Secondary | ICD-10-CM

## 2018-09-09 DIAGNOSIS — R112 Nausea with vomiting, unspecified: Secondary | ICD-10-CM | POA: Diagnosis not present

## 2018-09-09 DIAGNOSIS — R1110 Vomiting, unspecified: Secondary | ICD-10-CM

## 2018-09-09 DIAGNOSIS — J449 Chronic obstructive pulmonary disease, unspecified: Secondary | ICD-10-CM | POA: Diagnosis not present

## 2018-09-09 DIAGNOSIS — R1310 Dysphagia, unspecified: Secondary | ICD-10-CM | POA: Diagnosis not present

## 2018-09-09 DIAGNOSIS — K449 Diaphragmatic hernia without obstruction or gangrene: Secondary | ICD-10-CM | POA: Diagnosis not present

## 2018-09-09 DIAGNOSIS — Z7952 Long term (current) use of systemic steroids: Secondary | ICD-10-CM

## 2018-09-09 DIAGNOSIS — M797 Fibromyalgia: Secondary | ICD-10-CM | POA: Diagnosis not present

## 2018-09-09 DIAGNOSIS — E785 Hyperlipidemia, unspecified: Secondary | ICD-10-CM | POA: Diagnosis present

## 2018-09-09 DIAGNOSIS — M069 Rheumatoid arthritis, unspecified: Secondary | ICD-10-CM | POA: Diagnosis present

## 2018-09-09 DIAGNOSIS — Z23 Encounter for immunization: Secondary | ICD-10-CM

## 2018-09-09 DIAGNOSIS — R778 Other specified abnormalities of plasma proteins: Secondary | ICD-10-CM

## 2018-09-09 DIAGNOSIS — F1920 Other psychoactive substance dependence, uncomplicated: Secondary | ICD-10-CM | POA: Diagnosis not present

## 2018-09-09 DIAGNOSIS — I2510 Atherosclerotic heart disease of native coronary artery without angina pectoris: Secondary | ICD-10-CM | POA: Diagnosis not present

## 2018-09-09 DIAGNOSIS — R739 Hyperglycemia, unspecified: Secondary | ICD-10-CM | POA: Diagnosis present

## 2018-09-09 DIAGNOSIS — R110 Nausea: Secondary | ICD-10-CM | POA: Diagnosis not present

## 2018-09-09 DIAGNOSIS — K222 Esophageal obstruction: Secondary | ICD-10-CM | POA: Diagnosis present

## 2018-09-09 DIAGNOSIS — M1990 Unspecified osteoarthritis, unspecified site: Secondary | ICD-10-CM | POA: Diagnosis present

## 2018-09-09 DIAGNOSIS — M5410 Radiculopathy, site unspecified: Secondary | ICD-10-CM

## 2018-09-09 DIAGNOSIS — I10 Essential (primary) hypertension: Secondary | ICD-10-CM | POA: Diagnosis present

## 2018-09-09 DIAGNOSIS — R079 Chest pain, unspecified: Secondary | ICD-10-CM | POA: Diagnosis not present

## 2018-09-09 DIAGNOSIS — K920 Hematemesis: Secondary | ICD-10-CM | POA: Diagnosis not present

## 2018-09-09 DIAGNOSIS — E876 Hypokalemia: Secondary | ICD-10-CM | POA: Diagnosis present

## 2018-09-09 DIAGNOSIS — R000 Tachycardia, unspecified: Secondary | ICD-10-CM | POA: Diagnosis not present

## 2018-09-09 DIAGNOSIS — D72829 Elevated white blood cell count, unspecified: Secondary | ICD-10-CM | POA: Diagnosis not present

## 2018-09-09 DIAGNOSIS — E875 Hyperkalemia: Secondary | ICD-10-CM | POA: Diagnosis present

## 2018-09-09 DIAGNOSIS — N3010 Interstitial cystitis (chronic) without hematuria: Secondary | ICD-10-CM | POA: Diagnosis present

## 2018-09-09 DIAGNOSIS — Z20828 Contact with and (suspected) exposure to other viral communicable diseases: Secondary | ICD-10-CM | POA: Diagnosis present

## 2018-09-09 DIAGNOSIS — K760 Fatty (change of) liver, not elsewhere classified: Secondary | ICD-10-CM | POA: Diagnosis present

## 2018-09-09 DIAGNOSIS — K2100 Gastro-esophageal reflux disease with esophagitis, without bleeding: Secondary | ICD-10-CM | POA: Diagnosis present

## 2018-09-09 DIAGNOSIS — Z885 Allergy status to narcotic agent status: Secondary | ICD-10-CM

## 2018-09-09 DIAGNOSIS — K922 Gastrointestinal hemorrhage, unspecified: Secondary | ICD-10-CM

## 2018-09-09 DIAGNOSIS — Z90710 Acquired absence of both cervix and uterus: Secondary | ICD-10-CM

## 2018-09-09 DIAGNOSIS — R945 Abnormal results of liver function studies: Secondary | ICD-10-CM | POA: Diagnosis not present

## 2018-09-09 DIAGNOSIS — F1290 Cannabis use, unspecified, uncomplicated: Secondary | ICD-10-CM | POA: Diagnosis present

## 2018-09-09 DIAGNOSIS — F1193 Opioid use, unspecified with withdrawal: Secondary | ICD-10-CM

## 2018-09-09 DIAGNOSIS — K228 Other specified diseases of esophagus: Secondary | ICD-10-CM | POA: Diagnosis not present

## 2018-09-09 DIAGNOSIS — D649 Anemia, unspecified: Secondary | ICD-10-CM | POA: Diagnosis present

## 2018-09-09 DIAGNOSIS — Z8249 Family history of ischemic heart disease and other diseases of the circulatory system: Secondary | ICD-10-CM

## 2018-09-09 DIAGNOSIS — N289 Disorder of kidney and ureter, unspecified: Secondary | ICD-10-CM | POA: Diagnosis not present

## 2018-09-09 DIAGNOSIS — F339 Major depressive disorder, recurrent, unspecified: Secondary | ICD-10-CM | POA: Diagnosis present

## 2018-09-09 DIAGNOSIS — F329 Major depressive disorder, single episode, unspecified: Secondary | ICD-10-CM | POA: Diagnosis present

## 2018-09-09 DIAGNOSIS — G8929 Other chronic pain: Secondary | ICD-10-CM | POA: Diagnosis present

## 2018-09-09 DIAGNOSIS — R9431 Abnormal electrocardiogram [ECG] [EKG]: Secondary | ICD-10-CM | POA: Diagnosis not present

## 2018-09-09 DIAGNOSIS — K2950 Unspecified chronic gastritis without bleeding: Secondary | ICD-10-CM | POA: Diagnosis not present

## 2018-09-09 DIAGNOSIS — K209 Esophagitis, unspecified: Secondary | ICD-10-CM | POA: Diagnosis not present

## 2018-09-09 DIAGNOSIS — K589 Irritable bowel syndrome without diarrhea: Secondary | ICD-10-CM | POA: Diagnosis not present

## 2018-09-09 DIAGNOSIS — F1123 Opioid dependence with withdrawal: Secondary | ICD-10-CM | POA: Diagnosis present

## 2018-09-09 DIAGNOSIS — F419 Anxiety disorder, unspecified: Secondary | ICD-10-CM | POA: Diagnosis present

## 2018-09-09 DIAGNOSIS — M545 Low back pain, unspecified: Secondary | ICD-10-CM | POA: Diagnosis present

## 2018-09-09 DIAGNOSIS — Z882 Allergy status to sulfonamides status: Secondary | ICD-10-CM

## 2018-09-09 DIAGNOSIS — F17210 Nicotine dependence, cigarettes, uncomplicated: Secondary | ICD-10-CM | POA: Diagnosis present

## 2018-09-09 DIAGNOSIS — K226 Gastro-esophageal laceration-hemorrhage syndrome: Secondary | ICD-10-CM | POA: Diagnosis not present

## 2018-09-09 DIAGNOSIS — K319 Disease of stomach and duodenum, unspecified: Secondary | ICD-10-CM | POA: Diagnosis not present

## 2018-09-09 DIAGNOSIS — N179 Acute kidney failure, unspecified: Secondary | ICD-10-CM | POA: Diagnosis not present

## 2018-09-09 DIAGNOSIS — R7989 Other specified abnormal findings of blood chemistry: Secondary | ICD-10-CM | POA: Diagnosis not present

## 2018-09-09 DIAGNOSIS — N200 Calculus of kidney: Secondary | ICD-10-CM | POA: Diagnosis present

## 2018-09-09 DIAGNOSIS — M5116 Intervertebral disc disorders with radiculopathy, lumbar region: Secondary | ICD-10-CM | POA: Diagnosis present

## 2018-09-09 DIAGNOSIS — M5431 Sciatica, right side: Secondary | ICD-10-CM | POA: Diagnosis not present

## 2018-09-09 DIAGNOSIS — I493 Ventricular premature depolarization: Secondary | ICD-10-CM | POA: Diagnosis present

## 2018-09-09 DIAGNOSIS — K648 Other hemorrhoids: Secondary | ICD-10-CM | POA: Diagnosis present

## 2018-09-09 HISTORY — DX: Chronic obstructive pulmonary disease, unspecified: J44.9

## 2018-09-09 LAB — CBC WITH DIFFERENTIAL/PLATELET
Abs Immature Granulocytes: 0.09 10*3/uL — ABNORMAL HIGH (ref 0.00–0.07)
Basophils Absolute: 0 10*3/uL (ref 0.0–0.1)
Basophils Relative: 0 %
Eosinophils Absolute: 0 10*3/uL (ref 0.0–0.5)
Eosinophils Relative: 0 %
HCT: 45.1 % (ref 36.0–46.0)
Hemoglobin: 14.9 g/dL (ref 12.0–15.0)
Immature Granulocytes: 1 %
Lymphocytes Relative: 6 %
Lymphs Abs: 1 10*3/uL (ref 0.7–4.0)
MCH: 28.7 pg (ref 26.0–34.0)
MCHC: 33 g/dL (ref 30.0–36.0)
MCV: 86.7 fL (ref 80.0–100.0)
Monocytes Absolute: 0.7 10*3/uL (ref 0.1–1.0)
Monocytes Relative: 4 %
Neutro Abs: 14.5 10*3/uL — ABNORMAL HIGH (ref 1.7–7.7)
Neutrophils Relative %: 89 %
Platelets: 297 10*3/uL (ref 150–400)
RBC: 5.2 MIL/uL — ABNORMAL HIGH (ref 3.87–5.11)
RDW: 12.8 % (ref 11.5–15.5)
WBC: 16.2 10*3/uL — ABNORMAL HIGH (ref 4.0–10.5)
nRBC: 0 % (ref 0.0–0.2)

## 2018-09-09 LAB — COMPREHENSIVE METABOLIC PANEL WITH GFR
ALT: 17 U/L (ref 0–44)
AST: 17 U/L (ref 15–41)
Albumin: 4 g/dL (ref 3.5–5.0)
Alkaline Phosphatase: 85 U/L (ref 38–126)
Anion gap: 15 (ref 5–15)
BUN: 16 mg/dL (ref 8–23)
CO2: 23 mmol/L (ref 22–32)
Calcium: 9.8 mg/dL (ref 8.9–10.3)
Chloride: 99 mmol/L (ref 98–111)
Creatinine, Ser: 0.97 mg/dL (ref 0.44–1.00)
GFR calc Af Amer: >60 mL/min
GFR calc non Af Amer: >60 mL/min
Glucose, Bld: 147 mg/dL — ABNORMAL HIGH (ref 70–99)
Potassium: 3.7 mmol/L (ref 3.5–5.1)
Sodium: 137 mmol/L (ref 135–145)
Total Bilirubin: 0.8 mg/dL (ref 0.3–1.2)
Total Protein: 7.8 g/dL (ref 6.5–8.1)

## 2018-09-09 LAB — URINALYSIS, ROUTINE W REFLEX MICROSCOPIC
Bilirubin Urine: NEGATIVE
Glucose, UA: NEGATIVE mg/dL
Ketones, ur: 5 mg/dL — AB
Leukocytes,Ua: NEGATIVE
Nitrite: NEGATIVE
Protein, ur: >=300 mg/dL — AB
Specific Gravity, Urine: >1.046 — ABNORMAL HIGH (ref 1.005–1.030)
pH: 6 (ref 5.0–8.0)

## 2018-09-09 LAB — LIPASE, BLOOD: Lipase: 37 U/L (ref 11–51)

## 2018-09-09 LAB — RAPID URINE DRUG SCREEN, HOSP PERFORMED
Amphetamines: NOT DETECTED
Barbiturates: NOT DETECTED
Benzodiazepines: NOT DETECTED
Cocaine: POSITIVE — AB
Opiates: POSITIVE — AB
Tetrahydrocannabinol: POSITIVE — AB

## 2018-09-09 LAB — CBC
HCT: 44 % (ref 36.0–46.0)
Hemoglobin: 14.7 g/dL (ref 12.0–15.0)
MCH: 28.9 pg (ref 26.0–34.0)
MCHC: 33.4 g/dL (ref 30.0–36.0)
MCV: 86.4 fL (ref 80.0–100.0)
Platelets: 301 10*3/uL (ref 150–400)
RBC: 5.09 MIL/uL (ref 3.87–5.11)
RDW: 12.8 % (ref 11.5–15.5)
WBC: 19.8 10*3/uL — ABNORMAL HIGH (ref 4.0–10.5)
nRBC: 0 % (ref 0.0–0.2)

## 2018-09-09 LAB — TROPONIN I (HIGH SENSITIVITY)
Troponin I (High Sensitivity): 10 ng/L (ref <18)
Troponin I (High Sensitivity): 33 ng/L — ABNORMAL HIGH

## 2018-09-09 LAB — POC OCCULT BLOOD, ED: Fecal Occult Bld: POSITIVE — AB

## 2018-09-09 LAB — HEMOGLOBIN AND HEMATOCRIT, BLOOD
HCT: 42.2 % (ref 36.0–46.0)
Hemoglobin: 14.4 g/dL (ref 12.0–15.0)

## 2018-09-09 LAB — LACTIC ACID, PLASMA: Lactic Acid, Venous: 1.9 mmol/L (ref 0.5–1.9)

## 2018-09-09 LAB — SARS CORONAVIRUS 2 BY RT PCR (HOSPITAL ORDER, PERFORMED IN ~~LOC~~ HOSPITAL LAB): SARS Coronavirus 2: NEGATIVE

## 2018-09-09 MED ORDER — PANTOPRAZOLE SODIUM 40 MG IV SOLR
40.0000 mg | Freq: Once | INTRAVENOUS | Status: AC
  Administered 2018-09-09: 13:00:00 40 mg via INTRAVENOUS
  Filled 2018-09-09: qty 40

## 2018-09-09 MED ORDER — LOPERAMIDE HCL 2 MG PO CAPS: 2.0000 mg | ORAL_CAPSULE | ORAL | Status: DC | PRN

## 2018-09-09 MED ORDER — ACETAMINOPHEN 650 MG RE SUPP: 650.0000 mg | Freq: Four times a day (QID) | RECTAL | Status: DC | PRN

## 2018-09-09 MED ORDER — ONDANSETRON 4 MG PO TBDP
4.0000 mg | ORAL_TABLET | Freq: Four times a day (QID) | ORAL | Status: DC | PRN
  Administered 2018-09-11 – 2018-09-12 (×2): 4 mg via ORAL
  Filled 2018-09-09 (×5): qty 1

## 2018-09-09 MED ORDER — PROMETHAZINE HCL 25 MG/ML IJ SOLN
12.5000 mg | Freq: Once | INTRAMUSCULAR | Status: AC
  Administered 2018-09-09: 12.5 mg via INTRAVENOUS
  Filled 2018-09-09: qty 1

## 2018-09-09 MED ORDER — IOHEXOL 350 MG/ML SOLN
100.0000 mL | Freq: Once | INTRAVENOUS | Status: AC | PRN
  Administered 2018-09-09: 100 mL via INTRAVENOUS

## 2018-09-09 MED ORDER — ONDANSETRON HCL 4 MG PO TABS: 8.0000 mg | ORAL_TABLET | Freq: Four times a day (QID) | ORAL | Status: DC | PRN

## 2018-09-09 MED ORDER — HALOPERIDOL LACTATE 5 MG/ML IJ SOLN
5.0000 mg | Freq: Once | INTRAMUSCULAR | Status: AC
  Administered 2018-09-09: 14:00:00 5 mg via INTRAVENOUS
  Filled 2018-09-09: qty 1

## 2018-09-09 MED ORDER — SODIUM CHLORIDE 0.9% FLUSH
3.0000 mL | Freq: Two times a day (BID) | INTRAVENOUS | Status: DC
  Administered 2018-09-09 – 2018-09-18 (×6): 3 mL via INTRAVENOUS

## 2018-09-09 MED ORDER — DICYCLOMINE HCL 20 MG PO TABS
20.0000 mg | ORAL_TABLET | Freq: Four times a day (QID) | ORAL | Status: AC | PRN
  Filled 2018-09-09: qty 1

## 2018-09-09 MED ORDER — POTASSIUM CHLORIDE IN NACL 20-0.9 MEQ/L-% IV SOLN
INTRAVENOUS | Status: DC
  Administered 2018-09-09 – 2018-09-10 (×2): via INTRAVENOUS
  Filled 2018-09-09 (×3): qty 1000

## 2018-09-09 MED ORDER — ACETAMINOPHEN 325 MG PO TABS
650.0000 mg | ORAL_TABLET | Freq: Four times a day (QID) | ORAL | Status: DC | PRN
  Administered 2018-09-11 – 2018-09-18 (×7): 650 mg via ORAL
  Filled 2018-09-09 (×7): qty 2

## 2018-09-09 MED ORDER — VENLAFAXINE HCL ER 75 MG PO CP24
150.0000 mg | ORAL_CAPSULE | Freq: Every day | ORAL | Status: DC
  Administered 2018-09-11 – 2018-09-18 (×8): 150 mg via ORAL
  Filled 2018-09-09 (×2): qty 2
  Filled 2018-09-09: qty 1
  Filled 2018-09-09 (×5): qty 2
  Filled 2018-09-09: qty 1
  Filled 2018-09-09 (×2): qty 2

## 2018-09-09 MED ORDER — SODIUM CHLORIDE 0.9 % IV SOLN
8.0000 mg | Freq: Once | INTRAVENOUS | Status: AC
  Administered 2018-09-09: 14:00:00 8 mg via INTRAVENOUS
  Filled 2018-09-09: qty 4

## 2018-09-09 MED ORDER — HYDROXYZINE HCL 25 MG PO TABS
25.0000 mg | ORAL_TABLET | Freq: Four times a day (QID) | ORAL | Status: AC | PRN
  Administered 2018-09-09 – 2018-09-14 (×11): 25 mg via ORAL
  Filled 2018-09-09 (×12): qty 1

## 2018-09-09 MED ORDER — SODIUM CHLORIDE 0.9 % IV SOLN
8.0000 mg | Freq: Four times a day (QID) | INTRAVENOUS | Status: DC | PRN
  Administered 2018-09-09 – 2018-09-13 (×7): 8 mg via INTRAVENOUS
  Filled 2018-09-09 (×10): qty 4

## 2018-09-09 MED ORDER — CLONIDINE HCL 0.1 MG PO TABS
0.1000 mg | ORAL_TABLET | Freq: Once | ORAL | Status: AC
  Administered 2018-09-09: 15:00:00 0.1 mg via ORAL
  Filled 2018-09-09 (×2): qty 1

## 2018-09-09 MED ORDER — METOCLOPRAMIDE HCL 5 MG/ML IJ SOLN
5.0000 mg | Freq: Once | INTRAMUSCULAR | Status: AC
  Administered 2018-09-09: 14:00:00 5 mg via INTRAVENOUS
  Filled 2018-09-09: qty 2

## 2018-09-09 MED ORDER — METHOCARBAMOL 500 MG PO TABS
500.0000 mg | ORAL_TABLET | Freq: Three times a day (TID) | ORAL | Status: AC | PRN
  Administered 2018-09-11 – 2018-09-14 (×4): 500 mg via ORAL
  Filled 2018-09-09 (×5): qty 1

## 2018-09-09 MED ORDER — CLONIDINE HCL 0.1 MG PO TABS: 0.1000 mg | ORAL_TABLET | Freq: Four times a day (QID) | ORAL | 0 refills | Status: DC | PRN

## 2018-09-09 MED ORDER — ONDANSETRON 4 MG PO TBDP: 4.0000 mg | ORAL_TABLET | Freq: Three times a day (TID) | ORAL | 0 refills | Status: DC | PRN

## 2018-09-09 MED ORDER — NAPROXEN 250 MG PO TABS
500.0000 mg | ORAL_TABLET | Freq: Two times a day (BID) | ORAL | Status: DC | PRN
  Administered 2018-09-09: 22:00:00 500 mg via ORAL
  Filled 2018-09-09: qty 2

## 2018-09-09 MED ORDER — PANTOPRAZOLE SODIUM 40 MG IV SOLR: 40.0000 mg | Freq: Two times a day (BID) | INTRAVENOUS | Status: DC

## 2018-09-09 MED ORDER — PANTOPRAZOLE SODIUM 40 MG IV SOLR
40.0000 mg | Freq: Two times a day (BID) | INTRAVENOUS | Status: DC
  Administered 2018-09-09 – 2018-09-11 (×5): 40 mg via INTRAVENOUS
  Filled 2018-09-09 (×5): qty 40

## 2018-09-09 MED ORDER — SODIUM CHLORIDE 0.9 % IV BOLUS
1000.0000 mL | Freq: Once | INTRAVENOUS | Status: AC
  Administered 2018-09-09: 1000 mL via INTRAVENOUS

## 2018-09-09 MED ORDER — IBUPROFEN 400 MG PO TABS: 600.0000 mg | ORAL_TABLET | Freq: Four times a day (QID) | ORAL | Status: DC | PRN

## 2018-09-09 MED ORDER — LIDOCAINE 5 % EX PTCH
1.0000 | MEDICATED_PATCH | CUTANEOUS | Status: DC
  Administered 2018-09-09 – 2018-09-18 (×9): 1 via TRANSDERMAL
  Filled 2018-09-09 (×10): qty 1

## 2018-09-09 MED ORDER — SODIUM CHLORIDE 0.9 % IV SOLN
80.0000 mg | Freq: Once | INTRAVENOUS | Status: DC
  Filled 2018-09-09 (×2): qty 80

## 2018-09-09 MED ORDER — SODIUM CHLORIDE 0.9 % IV SOLN
8.0000 mg/h | INTRAVENOUS | Status: DC
  Filled 2018-09-09 (×2): qty 80

## 2018-09-09 NOTE — ED Notes (Addendum)
Pt stated that she does fentanyl. She stated she used it two days ago. Pt continues to spit on floor and pull 12 lead off. Pt is all over the bed and yelling.

## 2018-09-09 NOTE — Progress Notes (Signed)
CRITICAL VALUE ALERT  Critical Value: Troponin 33  Date & Time Notied:  09/09/2018 2110  Provider Notified: yes Schorr  Orders Received/Actions taken:

## 2018-09-09 NOTE — ED Provider Notes (Signed)
[facility] EMERGENCY DEPARTMENT Provider Note   CSN: [id number] Arrival date & time: 09/09/18  9562    History   Chief Complaint Chief Complaint  Patient presents with  . Nausea  . Chest Pain    HPI Laurie [name] is a 63 y.o. female.     63 year old female with past medical history of hypertension, hyperlipidemia, RA (not on immunosuppressive therapy brought in by EMS for nausea, vomiting, abdominal pain.  Patient states that her symptoms started yesterday.  Patient denies blood in emesis or stools, denies recent travel or sick contacts.  Patient also reports a nonproductive cough, unable to say how long this is been going on for.  Notes sweats, denies chills, no known fevers however temp on arrival of 100.3.  No known exposure to COVID.  Abdominal pain is diffuse, refuses to answer anything makes her pain better or worse or if pain radiates, states she has had similar pain in the past but will not elaborate.  Patient was given Zofran by EMS, reports ongoing severe nausea, no recent vomiting.  Prior abdominal surgeries include hysterectomy, appendectomy.  Patient is a daily smoker, also smokes marijuana- last use yesterday.  No other complaints or concerns.     Past Medical History:  Diagnosis Date  . Allergy   . Anxiety   . Arthritis    RA  . Asthma   . Chronic back pain   . Depression   . Esophageal stricture   . GERD (gastroesophageal reflux disease)   . Heart murmur   . Hyperlipidemia   . Hypertension   . IBS (irritable bowel syndrome)   . Interstitial cystitis   . Neuromuscular disorder [facility])    fibromyalgia    Patient Active Problem List   Diagnosis Date Noted  . Hematemesis 09/09/2018  . Opiate withdrawal ([place]) 09/09/2018  . Vitamin D deficiency 06/10/2018  . Shortness of breath 11/05/2017  . Elevated brain natriuretic peptide (BNP) level 11/05/2017  . Polysubstance (excluding opioids) dependence, daily use ([place]) 11/05/2017  . AKI  (acute kidney injury) ([place]) 11/05/2017  . Low serum vitamin D 08/23/2015  . Hyperlipidemia 11/08/2014  . Reflux esophagitis 03/23/2014  . Dysphagia, pharyngoesophageal phase 03/23/2014  . Pain in the chest 03/23/2014  . Special screening for malignant neoplasms, colon 07/09/2013  . Chest pain 04/23/2012  . Dyspnea 04/23/2012  . Murmur 04/23/2012  . INTERSTITIAL CYSTITIS 01/01/2010  . SWELLING, NECK 01/01/2010  . CAD, NATIVE VESSEL 12/19/2009  . Palpitations 11/21/2009  . ABNORMAL CV (STRESS) TEST 11/21/2009  . Essential hypertension 10/31/2009  . ELECTROCARDIOGRAM, ABNORMAL 10/25/2009  . PLANTAR FASCIITIS, BILATERAL 10/12/2009  . CERVICAL RADICULOPATHY 07/04/2008  . Pain in soft tissues of limb 03/25/2008  . DEGENERATIVE DISC DISEASE, CERVICAL SPINE, W/RADICULOPATHY 09/08/2007  . LOW BACK PAIN 09/08/2007  . TOBACCO USE 03/23/2007  . OSTEOARTHROSIS, LOCAL NOS, LOWER LEG 11/07/2006  . STATE, SYMPTOMATIC MENOPAUSE/FEM CLIMACTERIC 10/27/2006  . Depression, recurrent ([place]) 09/15/2006  . Thoracic or lumbosacral neuritis or radiculitis, unspecified 09/15/2006  . IBS 09/09/2006  . STRICTURE, ESOPHAGEAL 03/16/2001    Past Surgical History:  Procedure Laterality Date  . ABDOMINAL HYSTERECTOMY    . APPENDECTOMY    . CERVICAL FUSION    . CERVICAL LAMINECTOMY    . ESOPHAGOGASTRODUODENOSCOPY    . FINGER SURGERY    . LUMBAR FUSION    . TONSILLECTOMY       OB History   No obstetric history on file.      Home Medications  Prior to Admission medications   Medication Sig Start Date End Date Taking? Authorizing Provider  venlafaxine XR (EFFEXOR-XR) 150 MG 24 hr capsule Take 150 mg by mouth daily with breakfast.   Yes [provider]  albuterol (PROVENTIL HFA;VENTOLIN HFA) 108 (90 Base) MCG/ACT inhaler Inhale 2 puffs into the lungs every 6 (six) hours as needed for wheezing or shortness of breath. Patient not taking: Reported on 09/09/2018 05/20/17   [name], NP   amitriptyline (ELAVIL) 10 MG tablet Take 1 tablet (10 mg total) by mouth at bedtime. Patient not taking: Reported on 09/09/2018 07/18/17   [name], MD  Cholecalciferol (VITAMIN D3) 1.25 MG (50000 UT) CAPS Take 1 capsule by mouth once a week. Patient not taking: Reported on 09/09/2018 06/10/18   [name], MD  cloNIDine (CATAPRES) 0.1 MG tablet Take 1 tablet (0.1 mg total) by mouth 4 (four) times daily as needed for up to 8 days. 09/09/18 09/17/18  Tacy Learn, PA-C  DULoxetine (CYMBALTA) 60 MG capsule Take 1 capsule (60 mg total) by mouth daily. Patient not taking: Reported on 09/09/2018 06/10/18   [name], MD  hydrOXYzine (VISTARIL) 25 MG capsule Take 1 capsule (25 mg total) by mouth 3 (three) times daily as needed. Patient not taking: Reported on 09/09/2018 11/06/17   Donne Hazel, MD  ondansetron (ZOFRAN ODT) 4 MG disintegrating tablet Take 1 tablet (4 mg total) by mouth every 8 (eight) hours as needed for nausea or vomiting. 09/09/18   Suella Broad A, PA-C  polyethylene glycol (MIRALAX / GLYCOLAX) packet Take 17 g by mouth daily as needed for mild constipation. Patient not taking: Reported on 09/09/2018 11/06/17   Donne Hazel, MD  predniSONE (DELTASONE) 10 MG tablet Take 1 tablet (10 mg total) by mouth daily. Taper dose: 40mg  PO daily x 2 days, then 20 mg PO daily x 2 days, then 10mg  po daily x 2 days, 5mg  PO daily x 2 days, then stop. Zero refills Patient not taking: Reported on 09/09/2018 11/06/17   Donne Hazel, MD    Family History Family History  Problem Relation Age of Onset  . Dementia Mother   . Heart attack Father   . Coronary artery disease Father   . Cancer Brother        esophageal  . Esophageal cancer Brother   . Coronary artery disease Paternal Aunt   . Stomach cancer Paternal Aunt   . Coronary artery disease Paternal Grandmother   . Aneurysm Brother        aortic  . Rectal cancer Neg Hx   . Colon cancer Neg Hx     Social History  Social History   Tobacco Use  . Smoking status: Current Every Day Smoker    Packs/day: 0.50    Types: Cigarettes  . Smokeless tobacco: Never Used  . Tobacco comment: trying to quit   Substance Use Topics  . Alcohol use: No    Alcohol/week: 0.0 standard drinks  . Drug use: No     Allergies   Codeine, Lisinopril, Sulfonamide derivatives, and Tetracyclines & related   Review of Systems Review of Systems  Constitutional: Positive for diaphoresis. Negative for appetite change and chills.  HENT: Negative for congestion.   Respiratory: Positive for cough. Negative for shortness of breath.   Cardiovascular: Negative for chest pain.  Gastrointestinal: Positive for abdominal pain, diarrhea, nausea and vomiting. Negative for blood in stool and constipation.  Genitourinary: Positive for frequency. Negative for  dysuria.  Musculoskeletal: Positive for arthralgias and myalgias.  Skin: Negative for rash and wound.  Allergic/Immunologic: Negative for immunocompromised state.  Neurological: Negative for weakness and headaches.  Psychiatric/Behavioral: Negative for confusion.  All other systems reviewed and are negative.    Physical Exam Updated Vital Signs BP (!) 171/93   Pulse 86   Temp 98.6 F (37 C) (Oral)   Resp (!) 23   SpO2 97%   Physical Exam Vitals signs and nursing note reviewed.  Constitutional:      General: She is not in acute distress.    Appearance: She is well-developed. She is not diaphoretic.  HENT:     Head: Normocephalic and atraumatic.  Cardiovascular:     Rate and Rhythm: Normal rate and regular rhythm.     Pulses: Normal pulses.     Heart sounds: Normal heart sounds.  Pulmonary:     Effort: Pulmonary effort is normal.     Breath sounds: Normal breath sounds.  Abdominal:     Palpations: Abdomen is soft.     Tenderness: There is generalized abdominal tenderness.  Musculoskeletal:     Right lower leg: She exhibits no tenderness. No edema.     Left  lower leg: She exhibits no tenderness. No edema.  Skin:    General: Skin is warm and dry.     Findings: No erythema or rash.  Neurological:     Mental Status: She is alert and oriented to person, place, and time.  Psychiatric:        Behavior: Behavior is uncooperative.     Comments: Patient is minimally cooperative, kicking her legs on the bed and rolling around demanding medication for nausea.      ED Treatments / Results  Labs (all labs ordered are listed, but only abnormal results are displayed) Labs Reviewed  CBC WITH DIFFERENTIAL/PLATELET - Abnormal; Notable for the following components:      Result Value   WBC 16.2 (*)    RBC 5.20 (*)    Neutro Abs 14.5 (*)    Abs Immature Granulocytes 0.09 (*)    All other components within normal limits  COMPREHENSIVE METABOLIC PANEL - Abnormal; Notable for the following components:   Glucose, Bld 147 (*)    All other components within normal limits  POC OCCULT BLOOD, ED - Abnormal; Notable for the following components:   Fecal Occult Bld POSITIVE (*)    All other components within normal limits  SARS CORONAVIRUS 2 (HOSPITAL ORDER, [facility])  LIPASE, BLOOD  LACTIC ACID, PLASMA  URINALYSIS, ROUTINE W REFLEX MICROSCOPIC  CBC  RAPID URINE DRUG SCREEN, HOSP PERFORMED  HEMOGLOBIN AND HEMATOCRIT, BLOOD  HEMOGLOBIN AND HEMATOCRIT, BLOOD  TROPONIN I (HIGH SENSITIVITY)  TROPONIN I (HIGH SENSITIVITY)    EKG None  Radiology Ct Abdomen Pelvis W Contrast  Result Date: 09/09/2018 CLINICAL DATA:  Epigastric pain EXAM: CT ABDOMEN AND PELVIS WITH CONTRAST TECHNIQUE: Multidetector CT imaging of the abdomen and pelvis was performed using the standard protocol following bolus administration of intravenous contrast. CONTRAST:  137mL OMNIPAQUE IOHEXOL 350 MG/ML SOLN COMPARISON:  September 03, 2013 FINDINGS: Lower chest: Visualized lung bases are clear. Hepatobiliary: A small portion of the dome of the liver is not visualized  on this study. There is mild hepatic steatosis near the fissure for the ligamentum teres. There is a 4 mm probable cyst in the anterior segment right lobe of the liver. No other focal liver lesions are appreciable. Gallbladder wall is  not appreciably thickened. There is no biliary duct dilatation. Pancreas: There is no pancreatic mass or inflammatory focus. Spleen: No splenic lesions are evident. Adrenals/Urinary Tract: Adrenals bilaterally appear normal. Kidneys bilaterally show no evident mass or hydronephrosis on either side. There is a 1 mm calculus in the upper pole of the right kidney. There is no evident ureteral calculus on either side. Urinary bladder is midline with wall thickness within normal limits. Stomach/Bowel: There is no appreciable bowel wall or mesenteric thickening. There is no evident bowel obstruction. Terminal ileum appears unremarkable. There is no evident free air or portal venous air. Vascular/Lymphatic: There is aortic and iliac artery atherosclerosis. No evident aneurysm. No evident adenopathy in the abdomen or pelvis. Reproductive: Uterus is absent.  There is no evident pelvic mass. Other: The appendix is absent. There is no periappendiceal region inflammatory change. There is no abscess or ascites in the abdomen or pelvis. Musculoskeletal: There are foci of degenerative change in the lower thoracic and lumbar regions. There are no blastic or lytic bone lesions. There is no intramuscular or abdominal wall lesion. IMPRESSION: 1. 1 mm calculus upper pole right kidney. No hydronephrosis or ureteral calculus on either side. Urinary bladder wall thickness within normal limits. 2. No demonstrable bowel obstruction. No abscess in the abdomen or pelvis. Appendix absent. No periappendiceal region inflammation. 3.  Aortic and iliac artery atherosclerosis. 4.  Uterus absent. Electronically Signed   By: Lowella Grip III M.D.   On: 09/09/2018 11:34   Dg Chest Port 1 View  Result Date:  09/09/2018 CLINICAL DATA:  Cough and fever.  Chest pain beginning yesterday. EXAM: PORTABLE CHEST 1 VIEW COMPARISON:  One-view chest x-ray 11/05/2017. FINDINGS: Heart size is mildly enlarged. Atherosclerotic changes are noted at the aortic arch. Chronic changes of COPD are present. Chronic interstitial coarsening is similar the prior studies. No superimposed disease is present. There is no edema or effusion. The visualized soft tissues and bony thorax are unremarkable. IMPRESSION: 1. No acute pulmonary disease or significant interval change. 2. Stable chronic changes of COPD. Electronically Signed   By: San Morelle M.D.   On: 09/09/2018 07:57    Procedures Procedures (including critical care time)  Medications Ordered in ED Medications  lidocaine (LIDODERM) 5 % 1 patch (1 patch Transdermal Patch Applied 09/09/18 1229)  cloNIDine (CATAPRES) tablet 0.1 mg (has no administration in time range)  ondansetron (ZOFRAN) 8 mg in sodium chloride 0.9 % 50 mL IVPB (has no administration in time range)  venlafaxine XR (EFFEXOR-XR) 24 hr capsule 150 mg (has no administration in time range)  sodium chloride flush (NS) 0.9 % injection 3 mL (has no administration in time range)  0.9 % NaCl with KCl 20 mEq/ L  infusion (has no administration in time range)  acetaminophen (TYLENOL) tablet 650 mg (has no administration in time range)    Or  acetaminophen (TYLENOL) suppository 650 mg (has no administration in time range)  ibuprofen (ADVIL) tablet 600 mg (has no administration in time range)  ondansetron (ZOFRAN) tablet 8 mg (has no administration in time range)    Or  ondansetron (ZOFRAN) 8 mg in sodium chloride 0.9 % 50 mL IVPB (has no administration in time range)  pantoprazole (PROTONIX) 80 mg in sodium chloride 0.9 % 100 mL IVPB (has no administration in time range)  pantoprazole (PROTONIX) 80 mg in sodium chloride 0.9 % 250 mL (0.32 mg/mL) infusion (has no administration in time range)  pantoprazole  (PROTONIX) injection 40 mg (has no administration  in time range)  sodium chloride 0.9 % bolus 1,000 mL (0 mLs Intravenous Stopped 09/09/18 1229)  promethazine (PHENERGAN) injection 12.5 mg (12.5 mg Intravenous Given 09/09/18 0819)  iohexol (OMNIPAQUE) 350 MG/ML injection 100 mL (100 mLs Intravenous Contrast Given 09/09/18 1105)  pantoprazole (PROTONIX) injection 40 mg (40 mg Intravenous Given 09/09/18 1327)  haloperidol lactate (HALDOL) injection 5 mg (5 mg Intravenous Given 09/09/18 1345)     Initial Impression / Assessment and Plan / ED Course  I have reviewed the triage vital signs and the nursing notes.  Pertinent labs & imaging results that were available during my care of the patient were reviewed by me and considered in my medical decision making (see chart for details).  Clinical Course as of Sep 09 1350  Wed Sep 09, 2018  1204 63 yo female brought in by EMS with nausea and vomiting, given IV zofran without improvement in her extreme nausea. On exam, patient was kicking her feet, rolling around in bed, not very cooperative with questioning or exam. Noted to have generalized abdominal discomfort. Patient was given IV phenergan for her nausea. CT abdomen and pelvis negative for acute findings. CBC with elevated WBC at 16.2, CMP without significant changes. Lipase WNL, troponin x 1 normal. COVID negative, CXR with COPD. Lactic acid normal.  Patient continued to report nausea, no vomiting while in the emergency room.  Patient states that she has been taking fentanyl, not prescribed to her, for her right low back pain which radiates down her right leg.  Patient has not been to her PCP for this, denies any falls or injuries resulting in her pain and states this is been ongoing for some time.  Patient has not had the fentanyl in the past 2 days, is thought that patient may be withdrawing from fentanyl.  Plan was to discharge with clonidine and Zofran with close follow-up with patient's PCP.  Patient was  agreeable with this plan.  As patient was being discharged she began to vomit, emesis noted to be red to brown in nature and was positive for blood on Hemoccult.  Patient's hemoglobin and hematocrit were previously within normal limits, blood pressure elevated, not tachycardic. Plan is to admit for intractable nausea and vomiting now with Hemoccult positive emesis, consult GI with plan to admit to medicine.   [LM]  1348 Case discussed with hospitalist who will admit for observation. Patient continues to vomit, Haldol ordered. Continue to await GI consult.    [LM]  1351 Consult with GI who will see the patient.    [LM]    Clinical Course User Index [LM] Tacy Learn, PA-C      Final Clinical Impressions(s) / ED Diagnoses   Final diagnoses:  Nausea  Opioid withdrawal ([place])  Radiculopathy of leg  Gastrointestinal hemorrhage, unspecified gastrointestinal hemorrhage type    ED Discharge Orders         Ordered    cloNIDine (CATAPRES) 0.1 MG tablet  4 times daily PRN     09/09/18 1210    ondansetron (ZOFRAN ODT) 4 MG disintegrating tablet  Every 8 hours PRN     09/09/18 1210           Tacy Learn, PA-C 09/09/18 1353    Malvin Johns, MD 09/09/18 1427

## 2018-09-09 NOTE — ED Notes (Signed)
ED TO INPATIENT HANDOFF REPORT  ED Nurse Name and Phone #: Percell Locus, RN  S Name/Age/Gender Laurie [name] [place] 63 y.o. female Room/Bed: 013C/013C  Code Status   Code Status: Full Code  Home/SNF/Other Home Patient oriented to: self, place, time and situation Is this baseline? Yes   Triage Complete: Triage complete  Chief Complaint multiple complaints  Triage Note Pt BIB [facility] from home, c/o nausea/vomiting/diarrhea and chest pain that started yesterday afternoon. Pt states "I'm too sick to answer questions". EMS VSS, given 4mg  zofran IV PTA.   Allergies Allergies  Allergen Reactions  . Codeine     REACTION: Vomiting  . Lisinopril Cough  . Sulfonamide Derivatives     REACTION: Upset GI  . Tetracyclines & Related Rash    Level of Care/Admitting Diagnosis ED Disposition    ED Disposition Condition [facility]: [place] [[id number]]  Level of Care: Med-Surg [16]  I expect the patient will be discharged within 24 hours: Yes  LOW acuity---Tx typically complete <24 hrs---ACUTE conditions typically can be evaluated <24 hours---LABS likely to return to acceptable levels <24 hours---IS near functional baseline---EXPECTED to return to current living arrangement---NOT newly hypoxic: Meets criteria for 5C-Observation unit  Covid Evaluation: Asymptomatic Screening Protocol (No Symptoms)  Diagnosis: Hematemesis [578.0.ICD-9-CM]  Admitting Physician: Lady Deutscher [[id number]]  Attending Physician: Lady Deutscher [[id number]]  PT Class (Do Not Modify): Observation [104]  PT Acc Code (Do Not Modify): Observation [10022]       B Medical/Surgery History Past Medical History:  Diagnosis Date  . Allergy   . Anxiety   . Arthritis    RA  . Asthma   . Chronic back pain   . Depression   . Esophageal stricture   . GERD (gastroesophageal reflux disease)   . Heart murmur   . Hyperlipidemia   . Hypertension   . IBS (irritable bowel syndrome)   .  Interstitial cystitis   . Neuromuscular disorder ([place])    fibromyalgia   Past Surgical History:  Procedure Laterality Date  . ABDOMINAL HYSTERECTOMY    . APPENDECTOMY    . CERVICAL FUSION    . CERVICAL LAMINECTOMY    . ESOPHAGOGASTRODUODENOSCOPY    . FINGER SURGERY    . LUMBAR FUSION    . TONSILLECTOMY       A IV Location/Drains/Wounds Patient Lines/Drains/Airways Status   Active Line/Drains/Airways    Name:   Placement date:   Placement time:   Site:   Days:   Peripheral IV 09/09/18 Left Antecubital   09/09/18    -    Antecubital   less than 1          Intake/Output Last 24 hours  Intake/Output Summary (Last 24 hours) at 09/09/2018 1637 Last data filed at 09/09/2018 1229 Gross per 24 hour  Intake 1000 ml  Output -  Net 1000 ml    Labs/Imaging Results for orders placed or performed during the hospital encounter of 09/09/18 (from the past 48 hour(s))  Lactic acid, plasma     Status: None   Collection Time: 09/09/18  7:18 AM  Result Value Ref Range   Lactic Acid, Venous 1.9 0.5 - 1.9 mmol/L    Comment: Performed at [facility], [address], [place], [place] [address]  CBC with Differential     Status: Abnormal   Collection Time: 09/09/18  8:22 AM  Result Value Ref Range   WBC 16.2 (H) 4.0 - 10.5  K/uL   RBC 5.20 (H) 3.87 - 5.11 MIL/uL   Hemoglobin 14.9 12.0 - 15.0 g/dL   HCT 45.1 36.0 - 46.0 %   MCV 86.7 80.0 - 100.0 fL   MCH 28.7 26.0 - 34.0 pg   MCHC 33.0 30.0 - 36.0 g/dL   RDW 12.8 11.5 - 15.5 %   Platelets 297 150 - 400 K/uL   nRBC 0.0 0.0 - 0.2 %   Neutrophils Relative % 89 %   Neutro Abs 14.5 (H) 1.7 - 7.7 K/uL   Lymphocytes Relative 6 %   Lymphs Abs 1.0 0.7 - 4.0 K/uL   Monocytes Relative 4 %   Monocytes Absolute 0.7 0.1 - 1.0 K/uL   Eosinophils Relative 0 %   Eosinophils Absolute 0.0 0.0 - 0.5 K/uL   Basophils Relative 0 %   Basophils Absolute 0.0 0.0 - 0.1 K/uL   Immature Granulocytes 1 %   Abs Immature Granulocytes 0.09 (H) 0.00 -  0.07 K/uL    Comment: Performed at [facility] [address], [place], [place] [address]  Comprehensive metabolic panel     Status: Abnormal   Collection Time: 09/09/18  8:22 AM  Result Value Ref Range   Sodium 137 135 - 145 mmol/L   Potassium 3.7 3.5 - 5.1 mmol/L   Chloride 99 98 - 111 mmol/L   CO2 23 22 - 32 mmol/L   Glucose, Bld 147 (H) 70 - 99 mg/dL   BUN 16 8 - 23 mg/dL   Creatinine, Ser 0.97 0.44 - 1.00 mg/dL   Calcium 9.8 8.9 - 10.3 mg/dL   Total Protein 7.8 6.5 - 8.1 g/dL   Albumin 4.0 3.5 - 5.0 g/dL   AST 17 15 - 41 U/L   ALT 17 0 - 44 U/L   Alkaline Phosphatase 85 38 - 126 U/L   Total Bilirubin 0.8 0.3 - 1.2 mg/dL   GFR calc non Af Amer >60 >60 mL/min   GFR calc Af Amer >60 >60 mL/min   Anion gap 15 5 - 15    Comment: Performed at [facility] [address], [place], [place] [address]  Lipase, blood     Status: None   Collection Time: 09/09/18  8:22 AM  Result Value Ref Range   Lipase 37 11 - 51 U/L    Comment: Performed at [facility] [address], [place], [place] [address]  SARS Coronavirus 2 (CEPHEID- Performed in [place] hospital lab), Hosp Order     Status: None   Collection Time: 09/09/18  8:22 AM   Specimen: Nasopharyngeal Swab  Result Value Ref Range   SARS Coronavirus 2 NEGATIVE NEGATIVE    Comment: (NOTE) If result is NEGATIVE SARS-CoV-2 target nucleic acids are NOT DETECTED. The SARS-CoV-2 RNA is generally detectable in upper and lower  respiratory specimens during the acute phase of infection. The lowest  concentration of SARS-CoV-2 viral copies this assay can detect is 250  copies / mL. A negative result does not preclude SARS-CoV-2 infection  and should not be used as the sole basis for treatment or other  patient management decisions.  A negative result may occur with  improper specimen collection / handling, submission of specimen other  than nasopharyngeal swab, presence of viral mutation(s) within the  areas  targeted by this assay, and inadequate number of viral copies  (<250 copies / mL). A negative result must be combined with clinical  observations, patient history, and epidemiological  information. If result is POSITIVE SARS-CoV-2 target nucleic acids are DETECTED. The SARS-CoV-2 RNA is generally detectable in upper and lower  respiratory specimens dur ing the acute phase of infection.  Positive  results are indicative of active infection with SARS-CoV-2.  Clinical  correlation with patient history and other diagnostic information is  necessary to determine patient infection status.  Positive results do  not rule out bacterial infection or co-infection with other viruses. If result is PRESUMPTIVE POSTIVE SARS-CoV-2 nucleic acids MAY BE PRESENT.   A presumptive positive result was obtained on the submitted specimen  and confirmed on repeat testing.  While 2019 novel coronavirus  (SARS-CoV-2) nucleic acids may be present in the submitted sample  additional confirmatory testing may be necessary for epidemiological  and / or clinical management purposes  to differentiate between  SARS-CoV-2 and other Sarbecovirus currently known to infect humans.  If clinically indicated additional testing with an alternate test  methodology [phone]) is advised. The SARS-CoV-2 RNA is generally  detectable in upper and lower respiratory sp ecimens during the acute  phase of infection. The expected result is Negative. Fact Sheet for Patients:  StrictlyIdeas.no Fact Sheet for Healthcare Providers: BankingDealers.co.za This test is not yet approved or cleared by the [place] FDA and has been authorized for detection and/or diagnosis of SARS-CoV-2 by FDA under an Emergency Use Authorization (EUA).  This EUA will remain in effect (meaning this test can be used) for the duration of the COVID-19 declaration under Section 564(b)(1) of the Act, 21 U.S.C. section  360bbb-3(b)(1), unless the authorization is terminated or revoked sooner. Performed at [facility], [place] [address] [place], [place] [address]   Troponin I (High Sensitivity)     Status: None   Collection Time: 09/09/18  8:22 AM  Result Value Ref Range   Troponin I (High Sensitivity) 10 <18 ng/L    Comment: (NOTE) Elevated high sensitivity troponin I (hsTnI) values and significant  changes across serial measurements may suggest ACS but many other  chronic and acute conditions are known to elevate hsTnI results.  Refer to the "Links" section for chest pain algorithms and additional  guidance. Performed at [facility], [place] [address] [place], [place] [address]   POC occult blood, ED Provider will collect     Status: Abnormal   Collection Time: 09/09/18 12:36 PM  Result Value Ref Range   Fecal Occult Bld POSITIVE (A) NEGATIVE  CBC     Status: Abnormal   Collection Time: 09/09/18  1:45 PM  Result Value Ref Range   WBC 19.8 (H) 4.0 - 10.5 K/uL   RBC 5.09 3.87 - 5.11 MIL/uL   Hemoglobin 14.7 12.0 - 15.0 g/dL   HCT 44.0 36.0 - 46.0 %   MCV 86.4 80.0 - 100.0 fL   MCH 28.9 26.0 - 34.0 pg   MCHC 33.4 30.0 - 36.0 g/dL   RDW 12.8 11.5 - 15.5 %   Platelets 301 150 - 400 K/uL   nRBC 0.0 0.0 - 0.2 %    Comment: Performed at [facility], [place] [address], [place], [place] [address]  Urinalysis, Routine w reflex microscopic     Status: Abnormal   Collection Time: 09/09/18  1:57 PM  Result Value Ref Range   Color, Urine YELLOW YELLOW   APPearance CLEAR CLEAR   Specific Gravity, Urine >1.046 (H) 1.005 - 1.030   pH 6.0 5.0 - 8.0   Glucose, UA NEGATIVE NEGATIVE mg/dL  Hgb urine dipstick MODERATE (A) NEGATIVE   Bilirubin Urine NEGATIVE NEGATIVE   Ketones, ur 5 (A) NEGATIVE mg/dL   Protein, ur >=300 (A) NEGATIVE mg/dL   Nitrite NEGATIVE NEGATIVE   Leukocytes,Ua NEGATIVE NEGATIVE   RBC / HPF 11-20 0 - 5 RBC/hpf   WBC, UA 0-5 0 - 5 WBC/hpf   Bacteria, UA RARE (A) NONE  SEEN   Squamous Epithelial / LPF 0-5 0 - 5   Mucus PRESENT     Comment: Performed at [facility], [place] [address] [place], [place] [address]   Ct Abdomen Pelvis W Contrast  Result Date: 09/09/2018 CLINICAL DATA:  Epigastric pain EXAM: CT ABDOMEN AND PELVIS WITH CONTRAST TECHNIQUE: Multidetector CT imaging of the abdomen and pelvis was performed using the standard protocol following bolus administration of intravenous contrast. CONTRAST:  179mL OMNIPAQUE IOHEXOL 350 MG/ML SOLN COMPARISON:  September 03, 2013 FINDINGS: Lower chest: Visualized lung bases are clear. Hepatobiliary: A small portion of the dome of the liver is not visualized on this study. There is mild hepatic steatosis near the fissure for the ligamentum teres. There is a 4 mm probable cyst in the anterior segment right lobe of the liver. No other focal liver lesions are appreciable. Gallbladder wall is not appreciably thickened. There is no biliary duct dilatation. Pancreas: There is no pancreatic mass or inflammatory focus. Spleen: No splenic lesions are evident. Adrenals/Urinary Tract: Adrenals bilaterally appear normal. Kidneys bilaterally show no evident mass or hydronephrosis on either side. There is a 1 mm calculus in the upper pole of the right kidney. There is no evident ureteral calculus on either side. Urinary bladder is midline with wall thickness within normal limits. Stomach/Bowel: There is no appreciable bowel wall or mesenteric thickening. There is no evident bowel obstruction. Terminal ileum appears unremarkable. There is no evident free air or portal venous air. Vascular/Lymphatic: There is aortic and iliac artery atherosclerosis. No evident aneurysm. No evident adenopathy in the abdomen or pelvis. Reproductive: Uterus is absent.  There is no evident pelvic mass. Other: The appendix is absent. There is no periappendiceal region inflammatory change. There is no abscess or ascites in the abdomen or pelvis. Musculoskeletal: There  are foci of degenerative change in the lower thoracic and lumbar regions. There are no blastic or lytic bone lesions. There is no intramuscular or abdominal wall lesion. IMPRESSION: 1. 1 mm calculus upper pole right kidney. No hydronephrosis or ureteral calculus on either side. Urinary bladder wall thickness within normal limits. 2. No demonstrable bowel obstruction. No abscess in the abdomen or pelvis. Appendix absent. No periappendiceal region inflammation. 3.  Aortic and iliac artery atherosclerosis. 4.  Uterus absent. Electronically Signed   By: Lowella Grip III M.D.   On: 09/09/2018 11:34   Dg Chest Port 1 View  Result Date: 09/09/2018 CLINICAL DATA:  Cough and fever.  Chest pain beginning yesterday. EXAM: PORTABLE CHEST 1 VIEW COMPARISON:  One-view chest x-ray 11/05/2017. FINDINGS: Heart size is mildly enlarged. Atherosclerotic changes are noted at the aortic arch. Chronic changes of COPD are present. Chronic interstitial coarsening is similar the prior studies. No superimposed disease is present. There is no edema or effusion. The visualized soft tissues and bony thorax are unremarkable. IMPRESSION: 1. No acute pulmonary disease or significant interval change. 2. Stable chronic changes of COPD. Electronically Signed   By: San Morelle M.D.   On: 09/09/2018 07:57    Pending Labs [employer] (From admission, onward)    Start  Ordered   09/10/18 0500  CBC  Tomorrow morning,   R     09/09/18 1347   09/10/18 0500  Hepatitis C antibody  Tomorrow morning,   R     09/09/18 1447   09/10/18 0500  Hepatitis B surface antibody,qualitative  Tomorrow morning,   R     09/09/18 1447   09/10/18 0500  Hepatitis B surface antigen  Tomorrow morning,   R     09/09/18 1447   09/10/18 0500  Magnesium  Tomorrow morning,   R     09/09/18 1524   09/09/18 1428  Hemoglobin and hematocrit, blood  Now then every 8 hours,   R (with STAT occurrences)     09/09/18 1427   09/09/18 1333  Urine rapid  drug screen (hosp performed)  ONCE - STAT,   STAT     09/09/18 1332          Vitals/Pain Today's Vitals   09/09/18 1232 09/09/18 1245 09/09/18 1300 09/09/18 1315  BP:  (!) 146/93 (!) 171/70 (!) 171/93  Pulse:  91 67 86  Resp:      Temp: 98.6 F (37 C)     TempSrc: Oral     SpO2:  98% 98% 97%  PainSc:        Isolation Precautions Airborne and Contact precautions  Medications Medications  lidocaine (LIDODERM) 5 % 1 patch (1 patch Transdermal Patch Applied 09/09/18 1229)  venlafaxine XR (EFFEXOR-XR) 24 hr capsule 150 mg (has no administration in time range)  sodium chloride flush (NS) 0.9 % injection 3 mL (has no administration in time range)  0.9 % NaCl with KCl 20 mEq/ L  infusion (has no administration in time range)  acetaminophen (TYLENOL) tablet 650 mg (has no administration in time range)    Or  acetaminophen (TYLENOL) suppository 650 mg (has no administration in time range)  ondansetron (ZOFRAN) tablet 8 mg (has no administration in time range)    Or  ondansetron (ZOFRAN) 8 mg in sodium chloride 0.9 % 50 mL IVPB (has no administration in time range)  pantoprazole (PROTONIX) injection 40 mg (has no administration in time range)  sodium chloride 0.9 % bolus 1,000 mL (0 mLs Intravenous Stopped 09/09/18 1229)  promethazine (PHENERGAN) injection 12.5 mg (12.5 mg Intravenous Given 09/09/18 0819)  iohexol (OMNIPAQUE) 350 MG/ML injection 100 mL (100 mLs Intravenous Contrast Given 09/09/18 1105)  cloNIDine (CATAPRES) tablet 0.1 mg (0.1 mg Oral Given 09/09/18 1508)  pantoprazole (PROTONIX) injection 40 mg (40 mg Intravenous Given 09/09/18 1327)  haloperidol lactate (HALDOL) injection 5 mg (5 mg Intravenous Given 09/09/18 1345)  ondansetron (ZOFRAN) 8 mg in sodium chloride 0.9 % 50 mL IVPB (8 mg Intravenous New Bag/Given 09/09/18 1427)  metoCLOPramide (REGLAN) injection 5 mg (5 mg Intravenous Given 09/09/18 1419)    Mobility walks Low fall risk   Focused Assessments Cardiac  Assessment Handoff:    Lab Results  Component Value Date   CKTOTAL 47 10/25/2009   CKMB 0.9 10/25/2009   TROPONINI <0.03 11/06/2017   Lab Results  Component Value Date   DDIMER 2.24 (H) 07/20/2014   Does the Patient currently have chest pain? No     R Recommendations: See Admitting Provider Note  Report given to:   Additional Notes:

## 2018-09-09 NOTE — H&P (Signed)
History and Physical    Laurie [name] OFB:[id number] DOB: [birth year] DOA: 09/09/2018  PCP: [name], MD  Patient coming from: Home via EMS  I have personally briefly reviewed patient's old medical records in [place]  Chief Complaint: Nausea and vomiting and feeling very poorly  HPI: Laurie [name] is a 63 y.o. female with medical history significant of hypertension, hyperlipidemia, rheumatoid arthritis who presents the emergency department complaining of nausea vomiting abdominal pain.  Her symptoms started yesterday.  She has had no blood in her emesis or stools and denied recent travel or sick contacts she reports a nonproductive cough and she is a very reluctant historian.  She notes some sweats denies chills no known fevers temperature on arrival 100.3.  No known exposure to COVID-19.  She has diffuse abdominal pain and not able to answer if it makes anything better or worse.  She has had similar pain in the past but will not discuss it.  She was given Zofran by EMS and reports ongoing severe nausea with no recent vomiting.  She has had prior abdominal surgeries including a hysterectomy and appendectomy.  She is a daily smoker and also smokes marijuana.  In the ED white blood cell count was 16.2 and CMP was unremarkable.  COVID-19 was negative, chest x-ray shows COPD, after several hours of evaluation she tells the ED practitioner that she has been taking fentanyl which is not prescribed for her for her right lower back pain.  She states that the pain radiates down her right leg.  She has a known history of spinal disorders.  She has not been to her PCP for this and denies any falls or injuries resulting from the pain.  She ran out of fentanyl 2 days ago.  Think she may be withdrawing from the fentanyl.  Initial plan was to discharge the patient home with clonidine and Zofran with close follow-up with her PCP.  As patient was in the process of being discharged she began to  vomit and her emesis was red to brown and positive for blood on Hemoccult.  Was then referred to me for further evaluation and management.  GI was also consulted.  They recommended PPI initiation and plan on endoscopy tomorrow to evaluate her esophagus and stomach and small intestine.  Patient is agreeable to the plan.  ED Course: As noted above.  Hemoglobin stable at 14 labs unremarkable but she is having occult positive emesis.  Also appears to be withdrawing.  Had difficulty keeping her clonidine down.  Required more Zofran.  Review of Systems: As per HPI otherwise all other systems reviewed and  negative.  Past Medical History:  Diagnosis Date   Allergy    Anxiety    Arthritis    RA   Asthma    Chronic back pain    Depression    Esophageal stricture    GERD (gastroesophageal reflux disease)    Heart murmur    Hyperlipidemia    Hypertension    IBS (irritable bowel syndrome)    Interstitial cystitis    Neuromuscular disorder ([place])    fibromyalgia    Past Surgical History:  Procedure Laterality Date   ABDOMINAL HYSTERECTOMY     APPENDECTOMY     CERVICAL FUSION     CERVICAL LAMINECTOMY     ESOPHAGOGASTRODUODENOSCOPY     FINGER SURGERY     LUMBAR FUSION     TONSILLECTOMY      Social History   Social  History Narrative   Not on file     reports that she has been smoking cigarettes. She has been smoking about 0.50 packs per day. She has never used smokeless tobacco. She reports that she does not drink alcohol or use drugs.  She privately reports that she has been using street fentanyl and ran out 2 days ago and is now feeling like she may be withdrawing.  She is taking it for back pain.  Allergies  Allergen Reactions   Codeine     REACTION: Vomiting   Lisinopril Cough   Sulfonamide Derivatives     REACTION: Upset GI   Tetracyclines & Related Rash    Family History  Problem Relation Age of Onset   Dementia Mother    Heart attack  Father    Coronary artery disease Father    Cancer Brother        esophageal   Esophageal cancer Brother    Coronary artery disease Paternal Aunt    Stomach cancer Paternal Aunt    Coronary artery disease Paternal Grandmother    Aneurysm Brother        aortic   Rectal cancer Neg Hx    Colon cancer Neg Hx      Prior to Admission medications   Medication Sig Start Date End Date Taking? Authorizing Provider  venlafaxine XR (EFFEXOR-XR) 150 MG 24 hr capsule Take 150 mg by mouth daily with breakfast.   Yes [provider]  albuterol (PROVENTIL HFA;VENTOLIN HFA) 108 (90 Base) MCG/ACT inhaler Inhale 2 puffs into the lungs every 6 (six) hours as needed for wheezing or shortness of breath. Patient not taking: Reported on 09/09/2018 05/20/17   [name], NP  amitriptyline (ELAVIL) 10 MG tablet Take 1 tablet (10 mg total) by mouth at bedtime. Patient not taking: Reported on 09/09/2018 07/18/17   [name], MD  Cholecalciferol (VITAMIN D3) 1.25 MG (50000 UT) CAPS Take 1 capsule by mouth once a week. Patient not taking: Reported on 09/09/2018 06/10/18   [name], MD  cloNIDine (CATAPRES) 0.1 MG tablet Take 1 tablet (0.1 mg total) by mouth 4 (four) times daily as needed for up to 8 days. 09/09/18 09/17/18  Tacy Learn, PA-C  DULoxetine (CYMBALTA) 60 MG capsule Take 1 capsule (60 mg total) by mouth daily. Patient not taking: Reported on 09/09/2018 06/10/18   [name], MD  hydrOXYzine (VISTARIL) 25 MG capsule Take 1 capsule (25 mg total) by mouth 3 (three) times daily as needed. Patient not taking: Reported on 09/09/2018 11/06/17   Donne Hazel, MD  ondansetron (ZOFRAN ODT) 4 MG disintegrating tablet Take 1 tablet (4 mg total) by mouth every 8 (eight) hours as needed for nausea or vomiting. 09/09/18   Suella Broad A, PA-C  polyethylene glycol (MIRALAX / GLYCOLAX) packet Take 17 g by mouth daily as needed for mild constipation. Patient not taking: Reported on  09/09/2018 11/06/17   Donne Hazel, MD  predniSONE (DELTASONE) 10 MG tablet Take 1 tablet (10 mg total) by mouth daily. Taper dose: 40mg  PO daily x 2 days, then 20 mg PO daily x 2 days, then 10mg  po daily x 2 days, 5mg  PO daily x 2 days, then stop. Zero refills Patient not taking: Reported on 09/09/2018 11/06/17   Donne Hazel, MD    Physical Exam:  Constitutional: Acutely and chronically ill-appearing white female in fetal position lying on the stretcher.  Complaining of nausea and pain.  Mildly tremulous Vitals:  09/09/18 1232 09/09/18 1245 09/09/18 1300 09/09/18 1315  BP:  (!) 146/93 (!) 171/70 (!) 171/93  Pulse:  91 67 86  Resp:      Temp: 98.6 F (37 C)     TempSrc: Oral     SpO2:  98% 98% 97%   Eyes: PERRL, lids and conjunctivae pink, sclerae are white ENMT: Mucous membranes are moist. Posterior pharynx clear of any exudate or lesions.Normal dentition.  Neck: normal, supple, no masses, no thyromegaly Respiratory: clear to auscultation bilaterally, no wheezing, no crackles. Normal respiratory effort. No accessory muscle use.  Cardiovascular: Regular rate and rhythm slightly tachycardic at times, no murmurs / rubs / gallops. No extremity edema. 2+ pedal pulses. No carotid bruits.  Abdomen: Diffuse tenderness, no masses palpated. No hepatosplenomegaly. Bowel sounds positive.  No rebound or guarding Musculoskeletal: no clubbing / cyanosis.  Diffuse age-related arthropathy. Good ROM, no contractures. Normal muscle tone.  Skin: no rashes, lesions, ulcers. No induration Neurologic: CN 2-12 grossly intact. Sensation intact, DTR normal. Strength 5/5 in all 4.  Psychiatric: Normal judgment and insight. Alert and oriented x 3.  Mood is reluctant.    Labs on Admission: I have personally reviewed following labs and imaging studies  CBC: Recent Labs  Lab 09/09/18 0822  WBC 16.2*  NEUTROABS 14.5*  HGB 14.9  HCT 45.1  MCV 86.7  PLT 678   Basic Metabolic Panel: Recent Labs  Lab  09/09/18 0822  NA 137  K 3.7  CL 99  CO2 23  GLUCOSE 147*  BUN 16  CREATININE 0.97  CALCIUM 9.8   Liver Function Tests: Recent Labs  Lab 09/09/18 0822  AST 17  ALT 17  ALKPHOS 85  BILITOT 0.8  PROT 7.8  ALBUMIN 4.0   Recent Labs  Lab 09/09/18 0822  LIPASE 37   Urine analysis:    Component Value Date/Time   COLORURINE YELLOW 11/05/2017 [place] 11/05/2017 1221   LABSPEC 1.011 11/05/2017 1221   PHURINE 5.0 11/05/2017 1221   GLUCOSEU NEGATIVE 11/05/2017 1221   GLUCOSEU NEGATIVE 11/04/2011 1127   HGBUR SMALL (A) 11/05/2017 1221   HGBUR negative 01/01/2010 1055   BILIRUBINUR neg 11/14/2017 0859   KETONESUR NEGATIVE 11/05/2017 1221   PROTEINUR Negative 11/14/2017 0859   PROTEINUR NEGATIVE 11/05/2017 1221   UROBILINOGEN 0.2 11/14/2017 0859   UROBILINOGEN 0.2 11/04/2011 1127   NITRITE neg 11/14/2017 0859   NITRITE NEGATIVE 11/05/2017 1221   LEUKOCYTESUR Negative 11/14/2017 0859    Radiological Exams on Admission: Ct Abdomen Pelvis W Contrast  Result Date: 09/09/2018 CLINICAL DATA:  Epigastric pain EXAM: CT ABDOMEN AND PELVIS WITH CONTRAST TECHNIQUE: Multidetector CT imaging of the abdomen and pelvis was performed using the standard protocol following bolus administration of intravenous contrast. CONTRAST:  142mL OMNIPAQUE IOHEXOL 350 MG/ML SOLN COMPARISON:  September 03, 2013 FINDINGS: Lower chest: Visualized lung bases are clear. Hepatobiliary: A small portion of the dome of the liver is not visualized on this study. There is mild hepatic steatosis near the fissure for the ligamentum teres. There is a 4 mm probable cyst in the anterior segment right lobe of the liver. No other focal liver lesions are appreciable. Gallbladder wall is not appreciably thickened. There is no biliary duct dilatation. Pancreas: There is no pancreatic mass or inflammatory focus. Spleen: No splenic lesions are evident. Adrenals/Urinary Tract: Adrenals bilaterally appear normal.  Kidneys bilaterally show no evident mass or hydronephrosis on either side. There is a 1 mm calculus in the upper pole  of the right kidney. There is no evident ureteral calculus on either side. Urinary bladder is midline with wall thickness within normal limits. Stomach/Bowel: There is no appreciable bowel wall or mesenteric thickening. There is no evident bowel obstruction. Terminal ileum appears unremarkable. There is no evident free air or portal venous air. Vascular/Lymphatic: There is aortic and iliac artery atherosclerosis. No evident aneurysm. No evident adenopathy in the abdomen or pelvis. Reproductive: Uterus is absent.  There is no evident pelvic mass. Other: The appendix is absent. There is no periappendiceal region inflammatory change. There is no abscess or ascites in the abdomen or pelvis. Musculoskeletal: There are foci of degenerative change in the lower thoracic and lumbar regions. There are no blastic or lytic bone lesions. There is no intramuscular or abdominal wall lesion. IMPRESSION: 1. 1 mm calculus upper pole right kidney. No hydronephrosis or ureteral calculus on either side. Urinary bladder wall thickness within normal limits. 2. No demonstrable bowel obstruction. No abscess in the abdomen or pelvis. Appendix absent. No periappendiceal region inflammation. 3.  Aortic and iliac artery atherosclerosis. 4.  Uterus absent. Electronically Signed   By: Lowella Grip III M.D.   On: 09/09/2018 11:34   Dg Chest Port 1 View  Result Date: 09/09/2018 CLINICAL DATA:  Cough and fever.  Chest pain beginning yesterday. EXAM: PORTABLE CHEST 1 VIEW COMPARISON:  One-view chest x-ray 11/05/2017. FINDINGS: Heart size is mildly enlarged. Atherosclerotic changes are noted at the aortic arch. Chronic changes of COPD are present. Chronic interstitial coarsening is similar the prior studies. No superimposed disease is present. There is no edema or effusion. The visualized soft tissues and bony thorax are  unremarkable. IMPRESSION: 1. No acute pulmonary disease or significant interval change. 2. Stable chronic changes of COPD. Electronically Signed   By: San Morelle M.D.   On: 09/09/2018 07:57    EKG: Independently reviewed. Sinus rhythm Probable left ventricular hypertrophy Nonspecific T abnrm, anterolateral leads when compared to 11/06/2017 ST segment abnormalities have resolved  Assessment/Plan Principal Problem:   Hematemesis Active Problems:   Opiate withdrawal (HCC)   LOW BACK PAIN   Depression, recurrent ([place])    1.  Hematemesis: Likely related to nausea and vomiting.  She most likely has a Mallory-Weiss tear to rule out peptic ulcer disease and esophageal gastric malignancies as well as to look for lesions and ectasias etc.  She has been seen by GI who is planning esophagogastroduodenoscopy in a.m.  I have started her on a Protonix drip.  She will be n.p.o. except for meds.  We can advance slowly as tolerated.  2.  Opiate withdrawal: Patient has been obtaining street fentanyl.  She does not know how much she has been taking.  She is somewhat reluctant to give history.  We will start her on a clonidine protocol.  I have ordered Zofran as well.  3.  Low back pain: Acetaminophen has been ordered for pain.  Given GI bleeding will not start ibuprofen.  Other medications are addictive and would like to hold off on those for now.  4.:  Depression: Continue venlafaxine and home doses.   DVT prophylaxis: SCDs Code Status: Full code Family Communication: No family communication Disposition Plan: Likely home in 24 hours Consults called: GI consult done Admission status: Observation  It is my clinical opinion that referral for OBSERVATION is reasonable and necessary in this patient based on the above information provided. The aforementioned taken together are felt to place the patient at high risk for further  clinical deterioration. However it is anticipated that the patient may be  medically stable for discharge from the hospital within 24 to 48 hours.  Lady Deutscher MD FACP [facility] Pager [phone]  How to contact the [name] Attending or Consulting provider [place] or covering provider during after hours [place], for this patient?  1. Check the care team in [facility] and look for a) attending/consulting [facility] provider listed and b) the [facility] team listed 2. Log into [email or web address] and use [place]'s universal password to access. If you do not have the password, please contact the hospital operator. 3. Locate the [facility] provider you are looking for under [facility] and page to a number that you can be directly reached. 4. If you still have difficulty reaching the provider, please page the [facility] (Director on Call) for the Hospitalists listed on amion for assistance.  If 7PM-7AM, please contact night-coverage [email or web address] Password [email or web address]  09/09/2018, 1:50 PM

## 2018-09-09 NOTE — Consult Note (Addendum)
[facility] Consult: 1:43 PM 09/09/2018  LOS: 0 days    Referring Provider: PA Laurie [name] in the ED. Primary Care Physician:  Laurie Post, MD Primary Gastroenterologist:  Dr Laurie [name]    Reason for Consultation: Reddish-brown, gastric occult blood positive emesis.  Epigastric pain.   HPI: Laurie [name] is a 63 y.o. female.  Hx of IBS.  DDD with chronic back pain.  GERD.  Interstitial cystitis.  "Neuromuscular disorder". "Fibromyalgia ".  COPD.   Previous spinal surgeries, hysterectomy, appendectomy.  Urine drug screen 10/2017 positive for opiates, cocaine, THC. 08/2013 colonoscopy.  Average risk screening study.  A 3 and a 5 mm sessile polyp removed, positioned at ascending and descending colon.  A sending colon lipoma.  Internal hemorrhoids. 08/2013 EGD.  For complaint of dysphagia.  Early stricture at GE junction, dilated.  Reflux esophagitis.  Yesterday evening abrupt onset of nausea, vomiting diarrhea, chest pain, nonproductive cough.  Beginning yesterday, 7/28.  Abdominal pain diffuse.  In the ED temperature 100.3 F. Smokes both cigarettes and marijuana.  Last use of marijuana was yesterday. Has been snorting nonprescribed fentanyl for back pain and apparently ran out a couple of days ago.  Does she tells me that she last snorted the drug last evening.  Thinking was that she was having symptoms from fentanyl withdrawal and plan was to discharge her from the ED.  However before she could be discharged she developed emesis producing reddish-brown, gastric occult blood positive emesis.  She informs me that her stool has been brown from the very start.  She has had greater than 10 episodes.  She is in a lot of pain both from her back in the epigastrium.  She is developed restless activity in her limbs  and trunk, cannot stop moving around.  Positive chills and sweats.  Denies NSAIDs or aspirin products, EtOH.  Has not used PPI for at least a few years.  Has occasional dysphagia to solids.  Is not currently bloody or black.  A few to several weeks ago she had limited black stool.  Hgb 14.9, prior to the emesis.  WBC 16.2.  Glucose 147.  LFTs and lipase normal. Urine tox screen pending. CTAP with contrast shows normal stomach and bowel.  Aortic and iliac artery atherosclerosis, nonobstructing 1 mm right kidney stone.  Degenerative changes in the lower thoracic and lumbar spine. Hypertensive, 209/90, fever to 100.3 F, tachypneic but not tachycardic at arrival. Patient denies drinking alcoholic products.  She smokes marijuana.  Has been snorting illicitly obtained fentanyl for several months. Lives with her boyfriend in [place].  Laurie [name] history pertinent for a brother who had esophageal cancer.  A mother and aunt have had peptic ulcer disease.    Past Medical History:  Diagnosis Date   Allergy    Anxiety    Arthritis    RA   Asthma    Chronic back pain    Depression    Esophageal stricture    GERD (gastroesophageal reflux disease)    Heart murmur    Hyperlipidemia  Hypertension    IBS (irritable bowel syndrome)    Interstitial cystitis    Neuromuscular disorder ([place])    fibromyalgia    Past Surgical History:  Procedure Laterality Date   ABDOMINAL HYSTERECTOMY     APPENDECTOMY     CERVICAL FUSION     CERVICAL LAMINECTOMY     ESOPHAGOGASTRODUODENOSCOPY     FINGER SURGERY     LUMBAR FUSION     TONSILLECTOMY      Prior to Admission medications   Medication Sig Start Date End Date Taking? Authorizing Provider  venlafaxine XR (EFFEXOR-XR) 150 MG 24 hr capsule Take 150 mg by mouth daily with breakfast.   Yes [provider]  albuterol (PROVENTIL HFA;VENTOLIN HFA) 108 (90 Base) MCG/ACT inhaler Inhale 2 puffs into the lungs every 6 (six) hours as  needed for wheezing or shortness of breath. Patient not taking: Reported on 09/09/2018 05/20/17   Laurie Peng, NP  amitriptyline (ELAVIL) 10 MG tablet Take 1 tablet (10 mg total) by mouth at bedtime. Patient not taking: Reported on 09/09/2018 07/18/17   Laurie Post, MD  Cholecalciferol (VITAMIN D3) 1.25 MG (50000 UT) CAPS Take 1 capsule by mouth once a week. Patient not taking: Reported on 09/09/2018 06/10/18   Laurie Post, MD  cloNIDine (CATAPRES) 0.1 MG tablet Take 1 tablet (0.1 mg total) by mouth 4 (four) times daily as needed for up to 8 days. 09/09/18 09/17/18  Laurie Learn, PA-C  DULoxetine (CYMBALTA) 60 MG capsule Take 1 capsule (60 mg total) by mouth daily. Patient not taking: Reported on 09/09/2018 06/10/18   Laurie Post, MD  hydrOXYzine (VISTARIL) 25 MG capsule Take 1 capsule (25 mg total) by mouth 3 (three) times daily as needed. Patient not taking: Reported on 09/09/2018 11/06/17   Laurie Hazel, MD  ondansetron (ZOFRAN ODT) 4 MG disintegrating tablet Take 1 tablet (4 mg total) by mouth every 8 (eight) hours as needed for nausea or vomiting. 09/09/18   Laurie [name] A, PA-C  polyethylene glycol (MIRALAX / GLYCOLAX) packet Take 17 g by mouth daily as needed for mild constipation. Patient not taking: Reported on 09/09/2018 11/06/17   Laurie Hazel, MD  predniSONE (DELTASONE) 10 MG tablet Take 1 tablet (10 mg total) by mouth daily. Taper dose: 40mg  PO daily x 2 days, then 20 mg PO daily x 2 days, then 10mg  po daily x 2 days, 5mg  PO daily x 2 days, then stop. Zero refills Patient not taking: Reported on 09/09/2018 11/06/17   Laurie Hazel, MD    Scheduled Meds:  cloNIDine  0.1 mg Oral Once   haloperidol lactate  5 mg Intravenous Once   lidocaine  1 patch Transdermal Q24H   Infusions:  ondansetron (ZOFRAN) IV     PRN Meds:    Allergies as of 09/09/2018 - Review Complete 09/09/2018  Allergen Reaction Noted   Codeine     Lisinopril Cough 05/21/2013    Sulfonamide derivatives     Tetracyclines & related Rash 05/20/2017    Family History  Problem Relation Age of Onset   Dementia Mother    Heart attack Father    Coronary artery disease Father    Cancer Brother        esophageal   Esophageal cancer Brother    Coronary artery disease Paternal Aunt    Stomach cancer Paternal Aunt    Coronary artery disease Paternal Grandmother    Aneurysm Brother        aortic  Rectal cancer Neg Hx    Colon cancer Neg Hx     Social History   Socioeconomic History   Marital status: Single    Spouse name: Not on file   Number of children: Not on file   Years of education: Not on file   Highest education level: Not on file  Occupational History   Not on file  Social Needs   Financial resource strain: Not on file   Food insecurity    Worry: Not on file    Inability: Not on file   Transportation needs    Medical: Not on file    Non-medical: Not on file  Tobacco Use   Smoking status: Current Every Day Smoker    Packs/day: 0.50    Types: Cigarettes   Smokeless tobacco: Never Used   Tobacco comment: trying to quit   Substance and Sexual Activity   Alcohol use: No    Alcohol/week: 0.0 standard drinks   Drug use: No   Sexual activity: Not on file  Lifestyle   Physical activity    Days per week: Not on file    Minutes per session: Not on file   Stress: Not on file  Relationships   Social connections    Talks on phone: Not on file    Gets together: Not on file    Attends religious service: Not on file    Active member of club or organization: Not on file    Attends meetings of clubs or organizations: Not on file    Relationship status: Not on file   Intimate partner violence    Fear of current or ex partner: Not on file    Emotionally abused: Not on file    Physically abused: Not on file    Forced sexual activity: Not on file  Other Topics Concern   Not on file  Social History Narrative   Not on  file    REVIEW OF SYSTEMS: Constitutional: Feels tired and weak. ENT:  No nose bleeds Pulm:Marland Kitchen  Some nonproductive cough.  No DOE. CV:  No palpitations, no LE edema.  GU:  No hematuria, no frequency GI: See HPI. Heme: Bruises easily but no vigorous bleeding. Transfusions: None. Neuro: Feels very restless.  Cannot control the writhing movement in her limbs and body Derm:  No itching, no rash or sores.  Nuys history of icterus or jaundice. Endocrine:  No sweats or chills.  No polyuria or dysuria Immunization: Reviewed immunization history.  She was last flu vaccinated in 03/2017. Travel:  None beyond local counties in last few months.    PHYSICAL EXAM: Vital signs in last 24 hours: Vitals:   09/09/18 1300 09/09/18 1315  BP: (!) 171/70 (!) 171/93  Pulse: 67 86  Resp:    Temp:    SpO2: 98% 97%   Wt Readings from Last 3 Encounters:  11/14/17 70.4 kg  11/06/17 68.8 kg  11/05/17 70.5 kg   General: Uncomfortable, unwell appearing WF who is in distress, writhing on the bed complaining of pain. Head: No facial asymmetry or swelling.  No signs of head trauma. Eyes: No scleral icterus.  No conjunctival pallor.  EOMI.  Pupils not dilated. Ears: Not hard of hearing.  Nose: No congestion, no discharge. Mouth: Mucosa pink, somewhat dry with a white, nonspecific coating on the tongue. Neck: No JVD, no masses, no thyromegaly. Lungs: Clear bilaterally.  No labored breathing, no cough. Heart: RRR.  No MRG. Abdomen: Soft.  Bowel sounds active.  Not distended.  No organomegaly, masses, bruits, hernias.  Tender without guarding or rebound in the epigastrium/mid upper abdomen..   Rectal: Not performed. Musc/Skeltl: No joint redness or swelling. Extremities: No CCE.  Feet are warm.  Pedal pulses palpable. Neurologic: Writhing on the bed.  Moves all 4 limbs.  No asterixis.  Oriented appropriately. Skin: Tanned, not jaundiced. Nodes: No cervical adenopathy. Psych: Emotionally upset and in  distress but not crying.  Intake/Output from previous day: No intake/output data recorded. Intake/Output this shift: Total I/O In: 1000 [IV Piggyback:1000] Out: -   LAB RESULTS: Recent Labs    09/09/18 0822  WBC 16.2*  HGB 14.9  HCT 45.1  PLT 297   BMET Lab Results  Component Value Date   NA 137 09/09/2018   NA 140 11/06/2017   NA 143 11/05/2017   K 3.7 09/09/2018   K 3.6 11/06/2017   K 3.5 11/05/2017   CL 99 09/09/2018   CL 107 11/06/2017   CL 105 11/05/2017   CO2 23 09/09/2018   CO2 25 11/06/2017   CO2 27 11/05/2017   GLUCOSE 147 (H) 09/09/2018   GLUCOSE 152 (H) 11/06/2017   GLUCOSE 125 (H) 11/05/2017   BUN 16 09/09/2018   BUN 19 11/06/2017   BUN 17 11/05/2017   CREATININE 0.97 09/09/2018   CREATININE 1.15 (H) 11/06/2017   CREATININE 1.25 (H) 11/05/2017   CALCIUM 9.8 09/09/2018   CALCIUM 9.6 11/06/2017   CALCIUM 9.4 11/05/2017   LFT Recent Labs    09/09/18 0822  PROT 7.8  ALBUMIN 4.0  AST 17  ALT 17  ALKPHOS 85  BILITOT 0.8   PT/INR Lab Results  Component Value Date   INR 1.0 ratio 11/21/2009   Hepatitis Panel No results for input(s): HEPBSAG, HCVAB, HEPAIGM, HEPBIGM in the last 72 hours. C-Diff No components found for: CDIFF Lipase     Component Value Date/Time   LIPASE 37 09/09/2018 0822    Drugs of Abuse     Component Value Date/Time   LABOPIA POSITIVE (A) 11/05/2017 1712   COCAINSCRNUR POSITIVE (A) 11/05/2017 1712   LABBENZ NONE DETECTED 11/05/2017 1712   AMPHETMU NONE DETECTED 11/05/2017 1712   THCU POSITIVE (A) 11/05/2017 1712   LABBARB NONE DETECTED 11/05/2017 1712     RADIOLOGY STUDIES: Ct Abdomen Pelvis W Contrast  Result Date: 09/09/2018 CLINICAL DATA:  Epigastric pain EXAM: CT ABDOMEN AND PELVIS WITH CONTRAST TECHNIQUE: Multidetector CT imaging of the abdomen and pelvis was performed using the standard protocol following bolus administration of intravenous contrast. CONTRAST:  166mL OMNIPAQUE IOHEXOL 350 MG/ML SOLN  COMPARISON:  September 03, 2013 FINDINGS: Lower chest: Visualized lung bases are clear. Hepatobiliary: A small portion of the dome of the liver is not visualized on this study. There is mild hepatic steatosis near the fissure for the ligamentum teres. There is a 4 mm probable cyst in the anterior segment right lobe of the liver. No other focal liver lesions are appreciable. Gallbladder wall is not appreciably thickened. There is no biliary duct dilatation. Pancreas: There is no pancreatic mass or inflammatory focus. Spleen: No splenic lesions are evident. Adrenals/Urinary Tract: Adrenals bilaterally appear normal. Kidneys bilaterally show no evident mass or hydronephrosis on either side. There is a 1 mm calculus in the upper pole of the right kidney. There is no evident ureteral calculus on either side. Urinary bladder is midline with wall thickness within normal limits. Stomach/Bowel: There is no appreciable bowel wall or mesenteric thickening. There is no evident bowel  obstruction. Terminal ileum appears unremarkable. There is no evident free air or portal venous air. Vascular/Lymphatic: There is aortic and iliac artery atherosclerosis. No evident aneurysm. No evident adenopathy in the abdomen or pelvis. Reproductive: Uterus is absent.  There is no evident pelvic mass. Other: The appendix is absent. There is no periappendiceal region inflammatory change. There is no abscess or ascites in the abdomen or pelvis. Musculoskeletal: There are foci of degenerative change in the lower thoracic and lumbar regions. There are no blastic or lytic bone lesions. There is no intramuscular or abdominal wall lesion. IMPRESSION: 1. 1 mm calculus upper pole right kidney. No hydronephrosis or ureteral calculus on either side. Urinary bladder wall thickness within normal limits. 2. No demonstrable bowel obstruction. No abscess in the abdomen or pelvis. Appendix absent. No periappendiceal region inflammation. 3.  Aortic and iliac artery  atherosclerosis. 4.  Uterus absent. Electronically Signed   By: Lowella Grip III M.D.   On: 09/09/2018 11:34   Dg Chest Port 1 View  Result Date: 09/09/2018 CLINICAL DATA:  Cough and fever.  Chest pain beginning yesterday. EXAM: PORTABLE CHEST 1 VIEW COMPARISON:  One-view chest x-ray 11/05/2017. FINDINGS: Heart size is mildly enlarged. Atherosclerotic changes are noted at the aortic arch. Chronic changes of COPD are present. Chronic interstitial coarsening is similar the prior studies. No superimposed disease is present. There is no edema or effusion. The visualized soft tissues and bony thorax are unremarkable. IMPRESSION: 1. No acute pulmonary disease or significant interval change. 2. Stable chronic changes of COPD. Electronically Signed   By: San Morelle M.D.   On: 09/09/2018 07:57      IMPRESSION:   *    GOB positive reddish brown emesis.  R/o gastritis, esophagitis, MWT.   Diarrhea, abdominal pain.  Elevated WBCs.  Unremarkable CT abdomen pelvis in regards to gastric, biliary, intestinal issues.  *     Chronic back pain.  Nonprescribed use of fentanyl, ran out a couple of days ago.  Denting symptoms possibly due to acute withdrawal.   Tox screen pending.  *    Depression.  On a multitude of antidepressant meds.  Wonder if she has run out of those and withdrawal from this could be contributing to her neurologic symptoms as well?    PLAN:     *   EGD set for early tmrw afternoon.  *   Adjusted the Protonix.  Instead of drip, orfered Protonix 40 mg IV twice daily.  Stopped as needed ibuprofen order.  Orders for future H&H as well as CBCs in place.    *   Clears as tolerated, npo after mn.    *    Given her substance abuse, will check for hepatitis B and C serologies.   Azucena Freed  09/09/2018, 1:43 PM Phone [phone]

## 2018-09-09 NOTE — ED Notes (Signed)
Pt vomiting brown/red. PA notified.

## 2018-09-09 NOTE — ED Notes (Signed)
Pt has called out several. Pt is yelling and saying, "help me, help me." tech went in to let pt know we are trying to get her nausea medicine. Notified Clydene Fake, Therapist, sports.

## 2018-09-09 NOTE — ED Notes (Signed)
Pt requesting "something for nausea". PA notified.

## 2018-09-09 NOTE — ED Notes (Signed)
Dr Sheehan at bedside 

## 2018-09-09 NOTE — ED Notes (Signed)
Pt has spit on the floor. This RN handed the pt her emesis bag and told her that spitting on the floor was not appropriate behavior. This RN cleaned up spit.

## 2018-09-09 NOTE — Progress Notes (Signed)
Pt admitted to 6N19 from ED via stretcher.  Oriented to room and dept.  VS:  98.7, 67, 18 163/75 R arm.

## 2018-09-09 NOTE — ED Triage Notes (Signed)
Pt BIB [facility] from home, c/o nausea/vomiting/diarrhea and chest pain that started yesterday afternoon. Pt states "I'm too sick to answer questions". EMS VSS, given 4mg  zofran IV PTA.

## 2018-09-10 ENCOUNTER — Encounter (HOSPITAL_COMMUNITY): Payer: Self-pay | Admitting: General Practice

## 2018-09-10 DIAGNOSIS — N179 Acute kidney failure, unspecified: Secondary | ICD-10-CM | POA: Diagnosis not present

## 2018-09-10 DIAGNOSIS — F17210 Nicotine dependence, cigarettes, uncomplicated: Secondary | ICD-10-CM | POA: Diagnosis present

## 2018-09-10 DIAGNOSIS — K222 Esophageal obstruction: Secondary | ICD-10-CM | POA: Diagnosis not present

## 2018-09-10 DIAGNOSIS — D72829 Elevated white blood cell count, unspecified: Secondary | ICD-10-CM | POA: Diagnosis not present

## 2018-09-10 DIAGNOSIS — K449 Diaphragmatic hernia without obstruction or gangrene: Secondary | ICD-10-CM | POA: Diagnosis not present

## 2018-09-10 DIAGNOSIS — Z23 Encounter for immunization: Secondary | ICD-10-CM | POA: Diagnosis not present

## 2018-09-10 DIAGNOSIS — E785 Hyperlipidemia, unspecified: Secondary | ICD-10-CM | POA: Diagnosis present

## 2018-09-10 DIAGNOSIS — I2510 Atherosclerotic heart disease of native coronary artery without angina pectoris: Secondary | ICD-10-CM | POA: Diagnosis present

## 2018-09-10 DIAGNOSIS — K838 Other specified diseases of biliary tract: Secondary | ICD-10-CM | POA: Diagnosis not present

## 2018-09-10 DIAGNOSIS — K209 Esophagitis, unspecified: Secondary | ICD-10-CM | POA: Diagnosis not present

## 2018-09-10 DIAGNOSIS — N200 Calculus of kidney: Secondary | ICD-10-CM | POA: Diagnosis present

## 2018-09-10 DIAGNOSIS — M545 Low back pain: Secondary | ICD-10-CM

## 2018-09-10 DIAGNOSIS — K228 Other specified diseases of esophagus: Secondary | ICD-10-CM | POA: Diagnosis not present

## 2018-09-10 DIAGNOSIS — K589 Irritable bowel syndrome without diarrhea: Secondary | ICD-10-CM | POA: Diagnosis present

## 2018-09-10 DIAGNOSIS — K920 Hematemesis: Secondary | ICD-10-CM | POA: Diagnosis not present

## 2018-09-10 DIAGNOSIS — F329 Major depressive disorder, single episode, unspecified: Secondary | ICD-10-CM | POA: Diagnosis present

## 2018-09-10 DIAGNOSIS — R7989 Other specified abnormal findings of blood chemistry: Secondary | ICD-10-CM

## 2018-09-10 DIAGNOSIS — K3189 Other diseases of stomach and duodenum: Secondary | ICD-10-CM | POA: Diagnosis not present

## 2018-09-10 DIAGNOSIS — M5127 Other intervertebral disc displacement, lumbosacral region: Secondary | ICD-10-CM | POA: Diagnosis not present

## 2018-09-10 DIAGNOSIS — K760 Fatty (change of) liver, not elsewhere classified: Secondary | ICD-10-CM | POA: Diagnosis not present

## 2018-09-10 DIAGNOSIS — G8929 Other chronic pain: Secondary | ICD-10-CM | POA: Diagnosis present

## 2018-09-10 DIAGNOSIS — N289 Disorder of kidney and ureter, unspecified: Secondary | ICD-10-CM

## 2018-09-10 DIAGNOSIS — K210 Gastro-esophageal reflux disease with esophagitis: Secondary | ICD-10-CM | POA: Diagnosis not present

## 2018-09-10 DIAGNOSIS — K219 Gastro-esophageal reflux disease without esophagitis: Secondary | ICD-10-CM | POA: Diagnosis not present

## 2018-09-10 DIAGNOSIS — R112 Nausea with vomiting, unspecified: Secondary | ICD-10-CM

## 2018-09-10 DIAGNOSIS — R778 Other specified abnormalities of plasma proteins: Secondary | ICD-10-CM

## 2018-09-10 DIAGNOSIS — M5136 Other intervertebral disc degeneration, lumbar region: Secondary | ICD-10-CM | POA: Diagnosis not present

## 2018-09-10 DIAGNOSIS — I10 Essential (primary) hypertension: Secondary | ICD-10-CM | POA: Diagnosis present

## 2018-09-10 DIAGNOSIS — Z20828 Contact with and (suspected) exposure to other viral communicable diseases: Secondary | ICD-10-CM | POA: Diagnosis present

## 2018-09-10 DIAGNOSIS — K226 Gastro-esophageal laceration-hemorrhage syndrome: Secondary | ICD-10-CM | POA: Diagnosis not present

## 2018-09-10 DIAGNOSIS — E876 Hypokalemia: Secondary | ICD-10-CM | POA: Diagnosis present

## 2018-09-10 DIAGNOSIS — F339 Major depressive disorder, recurrent, unspecified: Secondary | ICD-10-CM

## 2018-09-10 DIAGNOSIS — F419 Anxiety disorder, unspecified: Secondary | ICD-10-CM | POA: Diagnosis present

## 2018-09-10 DIAGNOSIS — J449 Chronic obstructive pulmonary disease, unspecified: Secondary | ICD-10-CM | POA: Diagnosis present

## 2018-09-10 DIAGNOSIS — K2950 Unspecified chronic gastritis without bleeding: Secondary | ICD-10-CM | POA: Diagnosis present

## 2018-09-10 DIAGNOSIS — R739 Hyperglycemia, unspecified: Secondary | ICD-10-CM | POA: Diagnosis present

## 2018-09-10 DIAGNOSIS — R1310 Dysphagia, unspecified: Secondary | ICD-10-CM | POA: Diagnosis not present

## 2018-09-10 DIAGNOSIS — R52 Pain, unspecified: Secondary | ICD-10-CM | POA: Diagnosis not present

## 2018-09-10 DIAGNOSIS — K319 Disease of stomach and duodenum, unspecified: Secondary | ICD-10-CM | POA: Diagnosis present

## 2018-09-10 DIAGNOSIS — F1290 Cannabis use, unspecified, uncomplicated: Secondary | ICD-10-CM | POA: Diagnosis present

## 2018-09-10 DIAGNOSIS — M4317 Spondylolisthesis, lumbosacral region: Secondary | ICD-10-CM | POA: Diagnosis not present

## 2018-09-10 DIAGNOSIS — F1123 Opioid dependence with withdrawal: Secondary | ICD-10-CM | POA: Diagnosis present

## 2018-09-10 DIAGNOSIS — K2951 Unspecified chronic gastritis with bleeding: Secondary | ICD-10-CM | POA: Diagnosis not present

## 2018-09-10 DIAGNOSIS — M797 Fibromyalgia: Secondary | ICD-10-CM | POA: Diagnosis present

## 2018-09-10 DIAGNOSIS — F1920 Other psychoactive substance dependence, uncomplicated: Secondary | ICD-10-CM

## 2018-09-10 HISTORY — DX: Hematemesis: K92.0

## 2018-09-10 LAB — CBC
HCT: 43.9 % (ref 36.0–46.0)
Hemoglobin: 14.6 g/dL (ref 12.0–15.0)
MCH: 28.9 pg (ref 26.0–34.0)
MCHC: 33.3 g/dL (ref 30.0–36.0)
MCV: 86.8 fL (ref 80.0–100.0)
Platelets: 266 10*3/uL (ref 150–400)
RBC: 5.06 MIL/uL (ref 3.87–5.11)
RDW: 13.1 % (ref 11.5–15.5)
WBC: 12.8 10*3/uL — ABNORMAL HIGH (ref 4.0–10.5)
nRBC: 0 % (ref 0.0–0.2)

## 2018-09-10 LAB — COMPREHENSIVE METABOLIC PANEL
ALT: 17 U/L (ref 0–44)
AST: 31 U/L (ref 15–41)
Albumin: 4.1 g/dL (ref 3.5–5.0)
Alkaline Phosphatase: 74 U/L (ref 38–126)
Anion gap: 11 (ref 5–15)
BUN: 12 mg/dL (ref 8–23)
CO2: 23 mmol/L (ref 22–32)
Calcium: 9.4 mg/dL (ref 8.9–10.3)
Chloride: 104 mmol/L (ref 98–111)
Creatinine, Ser: 1.1 mg/dL — ABNORMAL HIGH (ref 0.44–1.00)
GFR calc Af Amer: >60 mL/min (ref >60)
GFR calc non Af Amer: 53 mL/min — ABNORMAL LOW (ref >60)
Glucose, Bld: 121 mg/dL — ABNORMAL HIGH (ref 70–99)
Potassium: 3.8 mmol/L (ref 3.5–5.1)
Sodium: 138 mmol/L (ref 135–145)
Total Bilirubin: 0.7 mg/dL (ref 0.3–1.2)
Total Protein: 7.4 g/dL (ref 6.5–8.1)

## 2018-09-10 LAB — MAGNESIUM: Magnesium: 2 mg/dL (ref 1.7–2.4)

## 2018-09-10 LAB — PHOSPHORUS: Phosphorus: 3 mg/dL (ref 2.5–4.6)

## 2018-09-10 LAB — TROPONIN I (HIGH SENSITIVITY)
Troponin I (High Sensitivity): 48 ng/L — ABNORMAL HIGH (ref <18)
Troponin I (High Sensitivity): 30 ng/L — ABNORMAL HIGH (ref <18)
Troponin I (High Sensitivity): 27 ng/L — ABNORMAL HIGH (ref <18)

## 2018-09-10 MED ORDER — CLONIDINE HCL 0.1 MG PO TABS
0.1000 mg | ORAL_TABLET | Freq: Every day | ORAL | Status: AC
  Administered 2018-09-14 – 2018-09-15 (×2): 0.1 mg via ORAL
  Filled 2018-09-10 (×2): qty 1

## 2018-09-10 MED ORDER — CLONIDINE HCL 0.1 MG PO TABS
0.1000 mg | ORAL_TABLET | Freq: Four times a day (QID) | ORAL | Status: AC
  Administered 2018-09-10 – 2018-09-11 (×8): 0.1 mg via ORAL
  Filled 2018-09-10 (×8): qty 1

## 2018-09-10 MED ORDER — CLONIDINE HCL 0.1 MG PO TABS
0.1000 mg | ORAL_TABLET | ORAL | Status: AC
  Administered 2018-09-12 – 2018-09-13 (×4): 0.1 mg via ORAL
  Filled 2018-09-10 (×5): qty 1

## 2018-09-10 MED ORDER — ROPINIROLE HCL 1 MG PO TABS
1.0000 mg | ORAL_TABLET | Freq: Once | ORAL | Status: AC
  Administered 2018-09-10: 1 mg via ORAL
  Filled 2018-09-10: qty 1

## 2018-09-10 MED ORDER — PNEUMOCOCCAL VAC POLYVALENT 25 MCG/0.5ML IJ INJ: 0.5000 mL | INJECTION | INTRAMUSCULAR | Status: DC

## 2018-09-10 MED ORDER — POTASSIUM CHLORIDE IN NACL 20-0.9 MEQ/L-% IV SOLN
INTRAVENOUS | Status: AC
  Administered 2018-09-11: 03:00:00 via INTRAVENOUS
  Filled 2018-09-10: qty 1000

## 2018-09-10 MED ORDER — PROMETHAZINE HCL 25 MG/ML IJ SOLN
12.5000 mg | Freq: Once | INTRAMUSCULAR | Status: AC
  Administered 2018-09-10: 12.5 mg via INTRAVENOUS
  Filled 2018-09-10: qty 1

## 2018-09-10 NOTE — H&P (View-Only) (Signed)
Progress Note    ASSESSMENT AND PLAN:   1. Nausea / vomiting / coffee ground emesis.  Reports black stool last week but no BMs since.  She is scheduled for EGD today but troponin elevated 33 >> 48. We ordered EKG earlier, not yet done. She has a hx of non-obstructive CAD. Recent chest  discomfort at home, none at present. Hemodynaamically stable. Normal BUN, hgb remains normal at 14.6 so no urgency to proceed with EGD right now if needs cardiac evaluation. .   2. Polysubstance use / opiate withdrawal. On Clonidine   SUBJECTIVE   Feels okay. Vomiting " a little" this am - yellow emesis, no coffee ground material   OBJECTIVE:     Vital signs in last 24 hours: Temp:  [98.1 F (36.7 C)-98.7 F (37.1 C)] 98.1 F (36.7 C) (07/30 0602) Pulse Rate:  [67-91] 77 (07/30 0602) Resp:  [17-23] 17 (07/30 0602) BP: (137-191)/(56-120) 141/56 (07/30 0602) SpO2:  [94 %-99 %] 94 % (07/30 0602) Last BM Date: 09/08/18 General:   Alert, well-developed female in NAD EENT:  Normal hearing, non icteric sclera, conjunctive pink.  Heart:  Regular rate and rhythm.  No lower extremity edema   Pulm: Normal respiratory effort, lungs CTA bilaterally without wheezes or crackles. Abdomen:  Soft, nondistended, nontender.  Normal bowel sounds,.   Neurologic:  Alert and  oriented x4;  grossly normal neurologically. Psych:  Pleasant, cooperative.  Normal mood and affect.   Intake/Output from previous day: 07/29 0701 - 07/30 0700 In: 1050 [IV Piggyback:1050] Out: -  Intake/Output this shift: No intake/output data recorded.  Lab Results: Recent Labs    09/09/18 0822 09/09/18 1345 09/09/18 2002 09/10/18 0354  WBC 16.2* 19.8*  --  12.8*  HGB 14.9 14.7 14.4 14.6  HCT 45.1 44.0 42.2 43.9  PLT 297 301  --  266   BMET Recent Labs    09/09/18 0822 09/10/18 0354  NA 137 138  K 3.7 3.8  CL 99 104  CO2 23 23  GLUCOSE 147* 121*  BUN 16 12  CREATININE 0.97 1.10*  CALCIUM 9.8 9.4   LFT Recent  Labs    09/10/18 0354  PROT 7.4  ALBUMIN 4.1  AST 31  ALT 17  ALKPHOS 74  BILITOT 0.7   Ct Abdomen Pelvis W Contrast  Result Date: 09/09/2018 CLINICAL DATA:  Epigastric pain EXAM: CT ABDOMEN AND PELVIS WITH CONTRAST TECHNIQUE: Multidetector CT imaging of the abdomen and pelvis was performed using the standard protocol following bolus administration of intravenous contrast. CONTRAST:  162mL OMNIPAQUE IOHEXOL 350 MG/ML SOLN COMPARISON:  September 03, 2013 FINDINGS: Lower chest: Visualized lung bases are clear. Hepatobiliary: A small portion of the dome of the liver is not visualized on this study. There is mild hepatic steatosis near the fissure for the ligamentum teres. There is a 4 mm probable cyst in the anterior segment right lobe of the liver. No other focal liver lesions are appreciable. Gallbladder wall is not appreciably thickened. There is no biliary duct dilatation. Pancreas: There is no pancreatic mass or inflammatory focus. Spleen: No splenic lesions are evident. Adrenals/Urinary Tract: Adrenals bilaterally appear normal. Kidneys bilaterally show no evident mass or hydronephrosis on either side. There is a 1 mm calculus in the upper pole of the right kidney. There is no evident ureteral calculus on either side. Urinary bladder is midline with wall thickness within normal limits. Stomach/Bowel: There is no appreciable bowel wall or mesenteric thickening. There is no  evident bowel obstruction. Terminal ileum appears unremarkable. There is no evident free air or portal venous air. Vascular/Lymphatic: There is aortic and iliac artery atherosclerosis. No evident aneurysm. No evident adenopathy in the abdomen or pelvis. Reproductive: Uterus is absent.  There is no evident pelvic mass. Other: The appendix is absent. There is no periappendiceal region inflammatory change. There is no abscess or ascites in the abdomen or pelvis. Musculoskeletal: There are foci of degenerative change in the lower thoracic and  lumbar regions. There are no blastic or lytic bone lesions. There is no intramuscular or abdominal wall lesion. IMPRESSION: 1. 1 mm calculus upper pole right kidney. No hydronephrosis or ureteral calculus on either side. Urinary bladder wall thickness within normal limits. 2. No demonstrable bowel obstruction. No abscess in the abdomen or pelvis. Appendix absent. No periappendiceal region inflammation. 3.  Aortic and iliac artery atherosclerosis. 4.  Uterus absent. Electronically Signed   By: Lowella Grip III M.D.   On: 09/09/2018 11:34   Dg Chest Port 1 View  Result Date: 09/09/2018 CLINICAL DATA:  Cough and fever.  Chest pain beginning yesterday. EXAM: PORTABLE CHEST 1 VIEW COMPARISON:  One-view chest x-ray 11/05/2017. FINDINGS: Heart size is mildly enlarged. Atherosclerotic changes are noted at the aortic arch. Chronic changes of COPD are present. Chronic interstitial coarsening is similar the prior studies. No superimposed disease is present. There is no edema or effusion. The visualized soft tissues and bony thorax are unremarkable. IMPRESSION: 1. No acute pulmonary disease or significant interval change. 2. Stable chronic changes of COPD. Electronically Signed   By: San Morelle M.D.   On: 09/09/2018 07:57   Principal Problem:   Hematemesis Active Problems:   Depression, recurrent ([place])   LOW BACK PAIN   Opiate withdrawal ([place])     LOS: 0 days   Tye Savoy ,NP 09/10/2018, 11:32 AM

## 2018-09-10 NOTE — Progress Notes (Signed)
PROGRESS NOTE    Laurie [name]  PVX:[id number] DOB: 11-10-1955 DOA: 09/09/2018 PCP: [name], MD   Brief Narrative:  HPI per Dr. Randa Spike on 09/09/2018 Laurie [name] is a 63 y.o. female with medical history significant of hypertension, hyperlipidemia, rheumatoid arthritis who presents the emergency department complaining of nausea vomiting abdominal pain.  Her symptoms started yesterday.  She has had no blood in her emesis or stools and denied recent travel or sick contacts she reports a nonproductive cough and she is a very reluctant historian.  She notes some sweats denies chills no known fevers temperature on arrival 100.3.  No known exposure to COVID-19.  She has diffuse abdominal pain and not able to answer if it makes anything better or worse.  She has had similar pain in the past but will not discuss it.  She was given Zofran by EMS and reports ongoing severe nausea with no recent vomiting.  She has had prior abdominal surgeries including a hysterectomy and appendectomy.  She is a daily smoker and also smokes marijuana.  In the ED white blood cell count was 16.2 and CMP was unremarkable.  COVID-19 was negative, chest x-ray shows COPD, after several hours of evaluation she tells the ED practitioner that she has been taking fentanyl which is not prescribed for her for her right lower back pain.  She states that the pain radiates down her right leg.  She has a known history of spinal disorders.  She has not been to her PCP for this and denies any falls or injuries resulting from the pain.  She ran out of fentanyl 2 days ago.  Think she may be withdrawing from the fentanyl.  Initial plan was to discharge the patient home with clonidine and Zofran with close follow-up with her PCP.  As patient was in the process of being discharged she began to vomit and her emesis was red to brown and positive for blood on Hemoccult.  Was then referred to me for further evaluation and management.   GI was also consulted.  They recommended PPI initiation and plan on endoscopy tomorrow to evaluate her esophagus and stomach and small intestine.  Patient is agreeable to the plan.  ED Course: As noted above.  Hemoglobin stable at 14 labs unremarkable but she is having occult positive emesis.  Also appears to be withdrawing.  Had difficulty keeping her clonidine down.  Required more Zofran.  **Interim History  Withdrawal protocol was started and patient placed on clonidine taper.  EGD was found to be done today however was canceled because of elevated troponin.  EKG showed normal sinus rhythm however we will repeat and trend troponins and GI has requested cardiology input so we have consulted cardiology.  EGD is tentatively scheduled for tomorrow morning.  Patient still complaining of some nausea and vomiting earlier this morning.  We will continue IV fluid hydration given risk of dehydration  Assessment & Plan:   Principal Problem:   Hematemesis Active Problems:   Depression, recurrent (HCC)   LOW BACK PAIN   Reflux esophagitis   Polysubstance (excluding opioids) dependence, daily use (HCC)   AKI (acute kidney injury) ([place])   Opiate withdrawal (HCC)   Renal insufficiency   Elevated troponin   Leukocytosis  Hematemesis -Likely related to nausea and vomiting.   -She most likely has a Mallory-Weiss tear to rule out peptic ulcer disease and esophageal gastric malignancies as well as to look for lesions and ectasias etc.   -  She has been seen by GI who is planning esophagogastroduodenoscopy today but however this was cancelled due to Elevated Troponin; Will check EKG and call Cardiology for Evaluation for candidacy for Anesthesia per GI request; They have rescheduled EGD for Friday  -She was started her on Protonix 40 mg q12h -She will be n.p.o. except for meds but will place back on Clear Liquids.  We can advance slowly as tolerated. -C/w Supportive Care and C/w Antiemetics with ondansetron  8 mg IV every 6 PRN for nausea and 4 mg p.o. every 6 as needed nausea and vomiting; -He was also given a dose of promethazine 12.5 mg once early this morning -FOBT was positive  Nausea and Vomiting -Likely 2/2 to Opiate Withdrawal -C/w IV fluids with normal saline +20 mEq of KCl at a rate of 100 mL's per hour for 24 hours at least -Antiemetics as above  Opiate Withdrawal -Patient has been obtaining street fentanyl.   -UDS done on positive for opiates, cocaine, THC -She does not know how much she has been taking.  She is somewhat reluctant to give history.   -We will start her on a clonidine protocol and she is on clonidine 0.  1 mg p.o. 4 times daily for today and tomorrow and then transitioning to 0.1 mg in the a.m. and at bedtime for 4 doses and then 0.1 mg p.o. daily for breakfast for 2 days For continued Bentyl 20 mg p.o. q. 6 as needed for spasms abdominal cramping -Continue with hydroxyzine 25 mg p.o. every 6 as needed anxiety -Started patient on lidocaine patch 1 patch transdermally every 12 hours -Continue with Robaxin 500 mg p.o. every 8 as needed for muscle spasms -Continue with ondansetron for antiemetics  Low Back Pain -Acetaminophen has been ordered for pain.   -Given GI bleeding will not start ibuprofen and discontinue naproxen that was ordered with clonidine protocol.   -Other medications are addictive and would like to hold off on those for now. -Continue with lidocaine patch -Continue with clonidine withdrawal protocol as above  Depression -Continue Venlafaxine 150 mg po Daily   Elevated Troponin -Was initially normal but started trending upwards and is now 48 high-sensitivity; likely in the setting of polysubstance abuse -We will repeat x2 -EKG ordered and was a normal sinus rhythm at a rate of 65 and a QTC of 457 -Cardiology will be consulted at the request of Gastroenterology for further evaluation recommendations  Polysubstance Abuse -UDS was positive for  opiates, cocaine, THC -Counseling given -We will need to monitor carefully  Leukocytosis -Trending down in the setting of hematemesis nausea vomiting from opiate withdrawal -Patient's WBC went from 19.8 is now 12.8 -Continue to monitor for signs and symptoms of infection -Repeat CBC in a.m.  Renal Insufficiency -Patient's BUN/creatinine went from 16/0.97 is now 12/1.10 -Avoid nephrotoxic medications, contrast dyes, and hypotension -Continue with normal saline rate of 100 and mils per hour +40 mEq of KCl -Continue monitor and trend Renal Function -Repeat CMP in a.m.  Hyperglycemia -Patient's blood sugar on admission was 147 and repeat this morning on CMP was 121 -Check HbA1c -Continue to Monitor and Trend Blood Glucose Carefully and if Necessary will place on Sensitive Novolog SSI AC  DVT prophylaxis: SCDs Code Status: FULL CODE  Family Communication: No family present at bedside Disposition Plan: Pending further work-up and evaluation by cardiology and EGD done by Gastroenterology  Consultants:   Gastroenterology   Cardiolog   Procedures:  EGD to be done  Antimicrobials:  Anti-infectives (From admission, onward)   None     Subjective: Seen and examined at bedside and she was still complaining of some significant nausea.  States that she has not thrown up any more blood yet.  No other concerns or complaints at this time and still complaining some back pain.  Objective: Vitals:   09/09/18 1646 09/09/18 1857 09/10/18 0602 09/10/18 1519  BP: (!) 137/120 (!) 163/75 (!) 141/56 (!) 156/68  Pulse: 86 67 77 66  Resp: 20 18 17 14   Temp:  98.7 F (37.1 C) 98.1 F (36.7 C) 98.8 F (37.1 C)  TempSrc:  Oral Oral Oral  SpO2: 97% 96% 94% 97%    Intake/Output Summary (Last 24 hours) at 09/10/2018 1522 Last data filed at 09/10/2018 1300 Gross per 24 hour  Intake 50 ml  Output --  Net 50 ml   There were no vitals filed for this visit.  Examination: Physical  Exam:  Constitutional: WN/WD older than appearing stated age 33 female currently NAD and appears uncomfortable Eyes: Lids and conjunctivae normal, sclerae anicteric  ENMT: External Ears, Nose appear normal. Grossly normal hearing.  Neck: Appears normal, supple, no cervical masses, normal ROM, no appreciable thyromegaly; no JVD Respiratory: Clear to auscultation bilaterally, no wheezing, rales, rhonchi or crackles. Normal respiratory effort and patient is not tachypenic. No accessory muscle use.  Cardiovascular: RRR, no murmurs / rubs / gallops. S1 and S2 auscultated. Abdomen: Soft, non-tender, non-distended on a limited skin eval. No masses palpated. No appreciable hepatosplenomegaly. Bowel sounds positive x4.  GU: Deferred. Musculoskeletal: No clubbing / cyanosis of digits/nails. No joint deformity upper and lower extremities.  Skin: No rashes, lesions, ulcers on a limited skin evaluation. No induration; Warm and dry.  Neurologic: CN 2-12 grossly intact with no focal deficits. Sensation intact in all 4 Extremities, DTR normal. Strength 5/5 in all 4.  Psychiatric: Normal judgment and insight. Alert and oriented x 3. Anxiouis mood and appropriate affect.   Data Reviewed: I have personally reviewed following labs and imaging studies  CBC: Recent Labs  Lab 09/09/18 0822 09/09/18 1345 09/09/18 2002 09/10/18 0354  WBC 16.2* 19.8*  --  12.8*  NEUTROABS 14.5*  --   --   --   HGB 14.9 14.7 14.4 14.6  HCT 45.1 44.0 42.2 43.9  MCV 86.7 86.4  --  86.8  PLT 297 301  --  563   Basic Metabolic Panel: Recent Labs  Lab 09/09/18 0822 09/10/18 0354  NA 137 138  K 3.7 3.8  CL 99 104  CO2 23 23  GLUCOSE 147* 121*  BUN 16 12  CREATININE 0.97 1.10*  CALCIUM 9.8 9.4  MG  --  2.0  PHOS  --  3.0   GFR: CrCl cannot be calculated (Unknown ideal weight.). Liver Function Tests: Recent Labs  Lab 09/09/18 0822 09/10/18 0354  AST 17 31  ALT 17 17  ALKPHOS 85 74  BILITOT 0.8 0.7  PROT  7.8 7.4  ALBUMIN 4.0 4.1   Recent Labs  Lab 09/09/18 0822  LIPASE 37   No results for input(s): AMMONIA in the last 168 hours. Coagulation Profile: No results for input(s): INR, PROTIME in the last 168 hours. Cardiac Enzymes: No results for input(s): CKTOTAL, CKMB, CKMBINDEX, TROPONINI in the last 168 hours. BNP (last 3 results) No results for input(s): PROBNP in the last 8760 hours. HbA1C: No results for input(s): HGBA1C in the last 72 hours. CBG: No results for input(s): GLUCAP in the last 168  hours. Lipid Profile: No results for input(s): CHOL, HDL, LDLCALC, TRIG, CHOLHDL, LDLDIRECT in the last 72 hours. Thyroid Function Tests: No results for input(s): TSH, T4TOTAL, FREET4, T3FREE, THYROIDAB in the last 72 hours. Anemia Panel: No results for input(s): VITAMINB12, FOLATE, FERRITIN, TIBC, IRON, RETICCTPCT in the last 72 hours. Sepsis Labs: Recent Labs  Lab 09/09/18 0718  LATICACIDVEN 1.9    Recent Results (from the past 240 hour(s))  SARS Coronavirus 2 (CEPHEID- Performed in [facility] hospital lab), Hosp Order     Status: None   Collection Time: 09/09/18  8:22 AM   Specimen: Nasopharyngeal Swab  Result Value Ref Range Status   SARS Coronavirus 2 NEGATIVE NEGATIVE Final    Comment: (NOTE) If result is NEGATIVE SARS-CoV-2 target nucleic acids are NOT DETECTED. The SARS-CoV-2 RNA is generally detectable in upper and lower  respiratory specimens during the acute phase of infection. The lowest  concentration of SARS-CoV-2 viral copies this assay can detect is 250  copies / mL. A negative result does not preclude SARS-CoV-2 infection  and should not be used as the sole basis for treatment or other  patient management decisions.  A negative result may occur with  improper specimen collection / handling, submission of specimen other  than nasopharyngeal swab, presence of viral mutation(s) within the  areas targeted by this assay, and inadequate number of viral copies   (<250 copies / mL). A negative result must be combined with clinical  observations, patient history, and epidemiological information. If result is POSITIVE SARS-CoV-2 target nucleic acids are DETECTED. The SARS-CoV-2 RNA is generally detectable in upper and lower  respiratory specimens dur ing the acute phase of infection.  Positive  results are indicative of active infection with SARS-CoV-2.  Clinical  correlation with patient history and other diagnostic information is  necessary to determine patient infection status.  Positive results do  not rule out bacterial infection or co-infection with other viruses. If result is PRESUMPTIVE POSTIVE SARS-CoV-2 nucleic acids MAY BE PRESENT.   A presumptive positive result was obtained on the submitted specimen  and confirmed on repeat testing.  While 2019 novel coronavirus  (SARS-CoV-2) nucleic acids may be present in the submitted sample  additional confirmatory testing may be necessary for epidemiological  and / or clinical management purposes  to differentiate between  SARS-CoV-2 and other Sarbecovirus currently known to infect humans.  If clinically indicated additional testing with an alternate test  methodology [id number]) is advised. The SARS-CoV-2 RNA is generally  detectable in upper and lower respiratory sp ecimens during the acute  phase of infection. The expected result is Negative. Fact Sheet for Patients:  StrictlyIdeas.no Fact Sheet for Healthcare Providers: BankingDealers.co.za This test is not yet approved or cleared by the [place] FDA and has been authorized for detection and/or diagnosis of SARS-CoV-2 by FDA under an Emergency Use Authorization (EUA).  This EUA will remain in effect (meaning this test can be used) for the duration of the COVID-19 declaration under Section 564(b)(1) of the Act, 21 U.S.C. section 360bbb-3(b)(1), unless the authorization is terminated  or revoked sooner. Performed at [facility], [place] [address], [place], [place] [address]    Radiology Studies: Ct Abdomen Pelvis W Contrast  Result Date: 09/09/2018 CLINICAL DATA:  Epigastric pain EXAM: CT ABDOMEN AND PELVIS WITH CONTRAST TECHNIQUE: Multidetector CT imaging of the abdomen and pelvis was performed using the standard protocol following bolus administration of intravenous contrast. CONTRAST:  156mL OMNIPAQUE IOHEXOL 350 MG/ML SOLN COMPARISON:  September 03, 2013 FINDINGS: Lower chest: Visualized lung bases are clear. Hepatobiliary: A small portion of the dome of the liver is not visualized on this study. There is mild hepatic steatosis near the fissure for the ligamentum teres. There is a 4 mm probable cyst in the anterior segment right lobe of the liver. No other focal liver lesions are appreciable. Gallbladder wall is not appreciably thickened. There is no biliary duct dilatation. Pancreas: There is no pancreatic mass or inflammatory focus. Spleen: No splenic lesions are evident. Adrenals/Urinary Tract: Adrenals bilaterally appear normal. Kidneys bilaterally show no evident mass or hydronephrosis on either side. There is a 1 mm calculus in the upper pole of the right kidney. There is no evident ureteral calculus on either side. Urinary bladder is midline with wall thickness within normal limits. Stomach/Bowel: There is no appreciable bowel wall or mesenteric thickening. There is no evident bowel obstruction. Terminal ileum appears unremarkable. There is no evident free air or portal venous air. Vascular/Lymphatic: There is aortic and iliac artery atherosclerosis. No evident aneurysm. No evident adenopathy in the abdomen or pelvis. Reproductive: Uterus is absent.  There is no evident pelvic mass. Other: The appendix is absent. There is no periappendiceal region inflammatory change. There is no abscess or ascites in the abdomen or pelvis. Musculoskeletal: There are foci of degenerative change  in the lower thoracic and lumbar regions. There are no blastic or lytic bone lesions. There is no intramuscular or abdominal wall lesion. IMPRESSION: 1. 1 mm calculus upper pole right kidney. No hydronephrosis or ureteral calculus on either side. Urinary bladder wall thickness within normal limits. 2. No demonstrable bowel obstruction. No abscess in the abdomen or pelvis. Appendix absent. No periappendiceal region inflammation. 3.  Aortic and iliac artery atherosclerosis. 4.  Uterus absent. Electronically Signed   By: Lowella Grip III M.D.   On: 09/09/2018 11:34   Dg Chest Port 1 View  Result Date: 09/09/2018 CLINICAL DATA:  Cough and fever.  Chest pain beginning yesterday. EXAM: PORTABLE CHEST 1 VIEW COMPARISON:  One-view chest x-ray 11/05/2017. FINDINGS: Heart size is mildly enlarged. Atherosclerotic changes are noted at the aortic arch. Chronic changes of COPD are present. Chronic interstitial coarsening is similar the prior studies. No superimposed disease is present. There is no edema or effusion. The visualized soft tissues and bony thorax are unremarkable. IMPRESSION: 1. No acute pulmonary disease or significant interval change. 2. Stable chronic changes of COPD. Electronically Signed   By: San Morelle M.D.   On: 09/09/2018 07:57   Scheduled Meds:  cloNIDine  0.1 mg Oral QID   Followed by   Derrill Memo ON 09/12/2018] cloNIDine  0.1 mg Oral BH-qamhs   Followed by   Derrill Memo ON 09/14/2018] cloNIDine  0.1 mg Oral QAC breakfast   lidocaine  1 patch Transdermal Q24H   pantoprazole  40 mg Intravenous Q12H   [START ON 09/11/2018] pneumococcal 23 valent vaccine  0.5 mL Intramuscular Tomorrow-1000   sodium chloride flush  3 mL Intravenous Q12H   venlafaxine XR  150 mg Oral Q breakfast   Continuous Infusions:  0.9 % NaCl with KCl 20 mEq / L 100 mL/hr at 09/10/18 1504   ondansetron (ZOFRAN) IV 8 mg (09/10/18 0546)    LOS: 0 days   Kerney Elbe, DO [facility] PAGER is  on AMION  If 7PM-7AM, please contact night-coverage [email or web address] Password [email or web address] 09/10/2018, 3:22 PM

## 2018-09-10 NOTE — Progress Notes (Addendum)
Progress Note    ASSESSMENT AND PLAN:   1. Nausea / vomiting / coffee ground emesis.  Reports black stool last week but no BMs since.  She is scheduled for EGD today but troponin elevated 33 >> 48. We ordered EKG earlier, not yet done. She has a hx of non-obstructive CAD. Recent chest  discomfort at home, none at present. Hemodynaamically stable. Normal BUN, hgb remains normal at 14.6 so no urgency to proceed with EGD right now if needs cardiac evaluation. .   2. Polysubstance use / opiate withdrawal. On Clonidine   SUBJECTIVE   Feels okay. Vomiting " a little" this am - yellow emesis, no coffee ground material   OBJECTIVE:     Vital signs in last 24 hours: Temp:  [98.1 F (36.7 C)-98.7 F (37.1 C)] 98.1 F (36.7 C) (07/30 0602) Pulse Rate:  [67-91] 77 (07/30 0602) Resp:  [17-23] 17 (07/30 0602) BP: (137-191)/(56-120) 141/56 (07/30 0602) SpO2:  [94 %-99 %] 94 % (07/30 0602) Last BM Date: 09/08/18 General:   Alert, well-developed female in NAD EENT:  Normal hearing, non icteric sclera, conjunctive pink.  Heart:  Regular rate and rhythm.  No lower extremity edema   Pulm: Normal respiratory effort, lungs CTA bilaterally without wheezes or crackles. Abdomen:  Soft, nondistended, nontender.  Normal bowel sounds,.   Neurologic:  Alert and  oriented x4;  grossly normal neurologically. Psych:  Pleasant, cooperative.  Normal mood and affect.   Intake/Output from previous day: 07/29 0701 - 07/30 0700 In: 1050 [IV Piggyback:1050] Out: -  Intake/Output this shift: No intake/output data recorded.  Lab Results: Recent Labs    09/09/18 0822 09/09/18 1345 09/09/18 2002 09/10/18 0354  WBC 16.2* 19.8*  --  12.8*  HGB 14.9 14.7 14.4 14.6  HCT 45.1 44.0 42.2 43.9  PLT 297 301  --  266   BMET Recent Labs    09/09/18 0822 09/10/18 0354  NA 137 138  K 3.7 3.8  CL 99 104  CO2 23 23  GLUCOSE 147* 121*  BUN 16 12  CREATININE 0.97 1.10*  CALCIUM 9.8 9.4   LFT Recent  Labs    09/10/18 0354  PROT 7.4  ALBUMIN 4.1  AST 31  ALT 17  ALKPHOS 74  BILITOT 0.7   Ct Abdomen Pelvis W Contrast  Result Date: 09/09/2018 CLINICAL DATA:  Epigastric pain EXAM: CT ABDOMEN AND PELVIS WITH CONTRAST TECHNIQUE: Multidetector CT imaging of the abdomen and pelvis was performed using the standard protocol following bolus administration of intravenous contrast. CONTRAST:  120mL OMNIPAQUE IOHEXOL 350 MG/ML SOLN COMPARISON:  September 03, 2013 FINDINGS: Lower chest: Visualized lung bases are clear. Hepatobiliary: A small portion of the dome of the liver is not visualized on this study. There is mild hepatic steatosis near the fissure for the ligamentum teres. There is a 4 mm probable cyst in the anterior segment right lobe of the liver. No other focal liver lesions are appreciable. Gallbladder wall is not appreciably thickened. There is no biliary duct dilatation. Pancreas: There is no pancreatic mass or inflammatory focus. Spleen: No splenic lesions are evident. Adrenals/Urinary Tract: Adrenals bilaterally appear normal. Kidneys bilaterally show no evident mass or hydronephrosis on either side. There is a 1 mm calculus in the upper pole of the right kidney. There is no evident ureteral calculus on either side. Urinary bladder is midline with wall thickness within normal limits. Stomach/Bowel: There is no appreciable bowel wall or mesenteric thickening. There is no  evident bowel obstruction. Terminal ileum appears unremarkable. There is no evident free air or portal venous air. Vascular/Lymphatic: There is aortic and iliac artery atherosclerosis. No evident aneurysm. No evident adenopathy in the abdomen or pelvis. Reproductive: Uterus is absent.  There is no evident pelvic mass. Other: The appendix is absent. There is no periappendiceal region inflammatory change. There is no abscess or ascites in the abdomen or pelvis. Musculoskeletal: There are foci of degenerative change in the lower thoracic and  lumbar regions. There are no blastic or lytic bone lesions. There is no intramuscular or abdominal wall lesion. IMPRESSION: 1. 1 mm calculus upper pole right kidney. No hydronephrosis or ureteral calculus on either side. Urinary bladder wall thickness within normal limits. 2. No demonstrable bowel obstruction. No abscess in the abdomen or pelvis. Appendix absent. No periappendiceal region inflammation. 3.  Aortic and iliac artery atherosclerosis. 4.  Uterus absent. Electronically Signed   By: Lowella Grip III M.D.   On: 09/09/2018 11:34   Dg Chest Port 1 View  Result Date: 09/09/2018 CLINICAL DATA:  Cough and fever.  Chest pain beginning yesterday. EXAM: PORTABLE CHEST 1 VIEW COMPARISON:  One-view chest x-ray 11/05/2017. FINDINGS: Heart size is mildly enlarged. Atherosclerotic changes are noted at the aortic arch. Chronic changes of COPD are present. Chronic interstitial coarsening is similar the prior studies. No superimposed disease is present. There is no edema or effusion. The visualized soft tissues and bony thorax are unremarkable. IMPRESSION: 1. No acute pulmonary disease or significant interval change. 2. Stable chronic changes of COPD. Electronically Signed   By: San Morelle M.D.   On: 09/09/2018 07:57   Principal Problem:   Hematemesis Active Problems:   Depression, recurrent ([place])   LOW BACK PAIN   Opiate withdrawal ([place])     LOS: 0 days   Tye Savoy ,NP 09/10/2018, 11:32 AM

## 2018-09-10 NOTE — Consult Note (Addendum)
Cardiology Consultation:   Patient ID: [name] MRN: [id number]; DOB: 1955/08/28  Admit date: 09/09/2018 Date of Consult: 09/10/2018  Primary Care Provider: [name], MD Primary Cardiologist: No primary care provider on file.  Primary Electrophysiologist:  None    Patient Profile:   Laurie [name] is a 63 y.o. female with a hx of hypertension,chronic back pain, COPD,  polysubstance abuse, hyperlipidemia, rheumatoid arthritis  who is being seen today for the evaluation of elevated Troponin and cardiac clearance for EGD at the request of Dr. Alfredia Ferguson.  History of Present Illness:  Cardiac history includes abnormal stress test in 2011. At this time patient was having chest pain possible due to food stuck in her esophagus. Perfusion images were normal with normal LVEF but she did have ST segment depression in recovery and a hypertensive response to exercise. LHC was ordered and showed mild to moderate CAD; 20% LAD, OMB 40% stenosis, RCA 30%.  Echo was 60-65% with Grade 2 DD.  Holter monitor was ordered which showed  PACs, PVCs.   Patient saw Dr. Johnsie Cancel in the office multiple times in 2014 for some atypical chest pain and new murmur. Work-up included a 2D echo revealed LVEF 09-73%, no diastolic dysfunction, trivial AR, mild MR, and trivial pericardial effusion. Patient did not return for futher f/u.   11/06/2017 patient was admitted for sob and was found to have elevated BNP. Patient underwent 2D echo which found EF 60-65 with no wall motion abnormality.   [name] presented to the ER 7/30 for nausea, vomiting, and abdominal pain. Denied bloody emesis or blood in her stools. She had some chills and had temp 100.3. She was given Zofran by [facility]. She noted that she had been obtaining street fentanyl for her back pain for previous spinal disorders, but hadn't had it for 2 days. She thinks symptoms were due to possible withdrawal. She smokes tobacco and marijuana.   In ED WBC was  16.2.Hgb 14. Creatinine 1.10. Electrolytes wnl.  CXR showed COPD.  UDS positive for opiates, cocaine, and THC.The ED planned to discharge her with Clonidine but she began vomiting and emesis was red to brown, positive for blood on Hemoccult. GI was consulted. Plan for EGD. HS Troponin was checked and found to be abnormal, 33>48. Cardiology was consulted for cardiac clearance.   Positive family history for CAD. Other risk factors include hyperlipidemia, tobacco abuse, hyperglycemia, drug use. Patient has history of rheumatic fever as a child. It doesn't t appear patient is on any cardiac medications prior to admission. Denies chest pain and shortness of breath.   Heart Pathway Score:     Past Medical History:  Diagnosis Date   Allergy    Anxiety    Arthritis    RA   Asthma    Chronic back pain    COPD (chronic obstructive pulmonary disease) (HCC)    Depression    Esophageal stricture    GERD (gastroesophageal reflux disease)    Heart murmur    Hematemesis 09/10/2018   Hyperlipidemia    Hypertension    IBS (irritable bowel syndrome)    Interstitial cystitis    Neuromuscular disorder (HCC)    fibromyalgia    Past Surgical History:  Procedure Laterality Date   ABDOMINAL HYSTERECTOMY     APPENDECTOMY     CERVICAL FUSION     CERVICAL LAMINECTOMY     ESOPHAGOGASTRODUODENOSCOPY     FINGER SURGERY     LUMBAR FUSION     TONSILLECTOMY  Home Medications:  Prior to Admission medications   Medication Sig Start Date End Date Taking? Authorizing Provider  venlafaxine XR (EFFEXOR-XR) 150 MG 24 hr capsule Take 150 mg by mouth daily with breakfast.   Yes [provider]  albuterol (PROVENTIL HFA;VENTOLIN HFA) 108 (90 Base) MCG/ACT inhaler Inhale 2 puffs into the lungs every 6 (six) hours as needed for wheezing or shortness of breath. Patient not taking: Reported on 09/09/2018 05/20/17   [name], NP  amitriptyline (ELAVIL) 10 MG tablet Take 1 tablet  (10 mg total) by mouth at bedtime. Patient not taking: Reported on 09/09/2018 07/18/17   [name], MD  Cholecalciferol (VITAMIN D3) 1.25 MG (50000 UT) CAPS Take 1 capsule by mouth once a week. Patient not taking: Reported on 09/09/2018 06/10/18   [name], MD  cloNIDine (CATAPRES) 0.1 MG tablet Take 1 tablet (0.1 mg total) by mouth 4 (four) times daily as needed for up to 8 days. 09/09/18 09/17/18  Tacy Learn, PA-C  DULoxetine (CYMBALTA) 60 MG capsule Take 1 capsule (60 mg total) by mouth daily. Patient not taking: Reported on 09/09/2018 06/10/18   [name], MD  hydrOXYzine (VISTARIL) 25 MG capsule Take 1 capsule (25 mg total) by mouth 3 (three) times daily as needed. Patient not taking: Reported on 09/09/2018 11/06/17   Donne Hazel, MD  ondansetron (ZOFRAN ODT) 4 MG disintegrating tablet Take 1 tablet (4 mg total) by mouth every 8 (eight) hours as needed for nausea or vomiting. 09/09/18   Suella Broad A, PA-C  polyethylene glycol (MIRALAX / GLYCOLAX) packet Take 17 g by mouth daily as needed for mild constipation. Patient not taking: Reported on 09/09/2018 11/06/17   Donne Hazel, MD  predniSONE (DELTASONE) 10 MG tablet Take 1 tablet (10 mg total) by mouth daily. Taper dose: 40mg  PO daily x 2 days, then 20 mg PO daily x 2 days, then 10mg  po daily x 2 days, 5mg  PO daily x 2 days, then stop. Zero refills Patient not taking: Reported on 09/09/2018 11/06/17   Donne Hazel, MD    Inpatient Medications: Scheduled Meds:  cloNIDine  0.1 mg Oral QID   Followed by   Derrill Memo ON 09/12/2018] cloNIDine  0.1 mg Oral BH-qamhs   Followed by   Derrill Memo ON 09/14/2018] cloNIDine  0.1 mg Oral QAC breakfast   lidocaine  1 patch Transdermal Q24H   pantoprazole  40 mg Intravenous Q12H   [START ON 09/11/2018] pneumococcal 23 valent vaccine  0.5 mL Intramuscular Tomorrow-1000   sodium chloride flush  3 mL Intravenous Q12H   venlafaxine XR  150 mg Oral Q breakfast   Continuous  Infusions:  0.9 % NaCl with KCl 20 mEq / L 100 mL/hr at 09/10/18 1504   ondansetron (ZOFRAN) IV 8 mg (09/10/18 0546)   PRN Meds: acetaminophen **OR** acetaminophen, dicyclomine, hydrOXYzine, loperamide, methocarbamol, [DISCONTINUED] ondansetron **OR** ondansetron (ZOFRAN) IV, ondansetron  Allergies:    Allergies  Allergen Reactions   Codeine     REACTION: Vomiting   Lisinopril Cough   Sulfonamide Derivatives     REACTION: Upset GI   Tetracyclines & Related Rash    Social History:   Social History   Socioeconomic History   Marital status: Single    Spouse name: Not on file   Number of children: Not on file   Years of education: Not on file   Highest education level: Not on file  Occupational History   Not on file  Social  Needs   Financial resource strain: Not on file   Food insecurity    Worry: Not on file    Inability: Not on file   Transportation needs    Medical: Not on file    Non-medical: Not on file  Tobacco Use   Smoking status: Current Every Day Smoker    Packs/day: 0.50    Types: Cigarettes   Smokeless tobacco: Never Used   Tobacco comment: trying to quit   Substance and Sexual Activity   Alcohol use: No    Alcohol/week: 0.0 standard drinks   Drug use: No   Sexual activity: Not on file  Lifestyle   Physical activity    Days per week: Not on file    Minutes per session: Not on file   Stress: Not on file  Relationships   Social connections    Talks on phone: Not on file    Gets together: Not on file    Attends religious service: Not on file    Active member of club or organization: Not on file    Attends meetings of clubs or organizations: Not on file    Relationship status: Not on file   Intimate partner violence    Fear of current or ex partner: Not on file    Emotionally abused: Not on file    Physically abused: Not on file    Forced sexual activity: Not on file  Other Topics Concern   Not on file  Social History  Narrative   Not on file    Family History:    Family History  Problem Relation Age of Onset   Dementia Mother    Heart attack Father    Coronary artery disease Father    Cancer Brother        esophageal   Esophageal cancer Brother    Coronary artery disease Paternal Aunt    Stomach cancer Paternal Aunt    Coronary artery disease Paternal Grandmother    Aneurysm Brother        aortic   Rectal cancer Neg Hx    Colon cancer Neg Hx      ROS:  Please see the history of present illness.  All other ROS reviewed and negative.     Physical Exam/Data:   Vitals:   09/09/18 1646 09/09/18 1857 09/10/18 0602 09/10/18 1519  BP: (!) 137/120 (!) 163/75 (!) 141/56 (!) 156/68  Pulse: 86 67 77 66  Resp: 20 18 17 14   Temp:  98.7 F (37.1 C) 98.1 F (36.7 C) 98.8 F (37.1 C)  TempSrc:  Oral Oral Oral  SpO2: 97% 96% 94% 97%    Intake/Output Summary (Last 24 hours) at 09/10/2018 1532 Last data filed at 09/10/2018 1300 Gross per 24 hour  Intake 50 ml  Output --  Net 50 ml   Last 3 Weights 11/14/2017 11/06/2017 11/05/2017  Weight (lbs) 155 lb 1.6 oz 151 lb 11.2 oz 150 lb 14.4 oz  Weight (kg) 70.353 kg 68.811 kg 68.448 kg     There is no height or weight on file to calculate BMI.  General:  Well nourished, well developed, in no acute distress HEENT: normal Neck: no JVD Endocrine:  No thryomegaly Vascular: No carotid bruits; FA pulses 2+ bilaterally without bruits  Cardiac:  normal S1, S2; RRR; no murmur  Lungs:  clear to auscultation bilaterally, no wheezing, rhonchi or rales  Abd: soft, nontender, no hepatomegaly  Ext: no edema Musculoskeletal:  No deformities, BUE and BLE  strength normal and equal Skin: warm and dry  Neuro:  CNs 2-12 intact, no focal abnormalities noted Psych:  Normal affect   EKG:  The EKG was personally reviewed and demonstrates:  NSR, 65 bpm, No ST/T wave changes, QTc 457 Telemetry:  Telemetry was personally reviewed and demonstrates:  NSR HR 60s,  no arrhythmias noted  Relevant CV Studies:  Echo 11/06/2017 - Left ventricle: The cavity size was normal. There was mild   concentric hypertrophy. Systolic function was vigorous. The   estimated ejection fraction was in the range of 65% to 70%. Wall   motion was normal; there were no regional wall motion   abnormalities. Left ventricular diastolic function parameters   were normal. - Aortic valve: Valve area (VTI): 2.38 cm^2. Valve area (Vmax):   2.41 cm^2. Valve area (Vmean): 2.45 cm^2. - Aortic root: The aortic root was normal in size. - Mitral valve: Structurally normal valve. There was mild   regurgitation. - Left atrium: The atrium was at the upper limits of normal in   size. - Right ventricle: The cavity size was normal. Wall thickness was   normal. Systolic function was normal. - Right atrium: The atrium was normal in size. - Tricuspid valve: There was mild regurgitation. - Pulmonary arteries: Systolic pressure was within the normal   range. - Inferior vena cava: The vessel was normal in size. - Pericardium, extracardiac: There was no pericardial effusion.   Cardiac Cath  Procedure date:  11/30/2009  Findings:      1. Left main coronary artery had no obstructive disease. 2. The left anterior descending was a large vessel that coursed to the     apex and gave off several diagonal branches.  There was a 20% mild     stenosis in the mid LAD. 3. Circumflex artery was composed of a moderate-sized proximal AV     groove vessel and then it gave off a moderate-sized obtuse marginal     branch.  The AV groove circumflex beyond the marginal branch was     very small in caliber.  At the very beginning portion of the first     obtuse marginal branch, there appeared to be a 40% stenosis.  This     did not appear to be flow-limiting. 4. The right coronary artery was a large dominant vessel that had     discrete 30% stenosis in the mid vessel. 5. Left ventricular angiogram was  performed in the RAO projection that     showed normal left ventricular systolic function with ejection     fraction of 55%.  Laboratory Data:  High Sensitivity Troponin:   Recent Labs  Lab 09/09/18 0822 09/09/18 2002 09/10/18 0959  TROPONINIHS 10 33* 48*     Cardiac EnzymesNo results for input(s): TROPONINI in the last 168 hours. No results for input(s): TROPIPOC in the last 168 hours.  Chemistry Recent Labs  Lab 09/09/18 0822 09/10/18 0354  NA 137 138  K 3.7 3.8  CL 99 104  CO2 23 23  GLUCOSE 147* 121*  BUN 16 12  CREATININE 0.97 1.10*  CALCIUM 9.8 9.4  GFRNONAA >60 53*  GFRAA >60 >60  ANIONGAP 15 11    Recent Labs  Lab 09/09/18 0822 09/10/18 0354  PROT 7.8 7.4  ALBUMIN 4.0 4.1  AST 17 31  ALT 17 17  ALKPHOS 85 74  BILITOT 0.8 0.7   Hematology Recent Labs  Lab 09/09/18 8891 09/09/18 1345 09/09/18 2002 09/10/18 0354  WBC 16.2* 19.8*  --  12.8*  RBC 5.20* 5.09  --  5.06  HGB 14.9 14.7 14.4 14.6  HCT 45.1 44.0 42.2 43.9  MCV 86.7 86.4  --  86.8  MCH 28.7 28.9  --  28.9  MCHC 33.0 33.4  --  33.3  RDW 12.8 12.8  --  13.1  PLT 297 301  --  266   BNPNo results for input(s): BNP, PROBNP in the last 168 hours.  DDimer No results for input(s): DDIMER in the last 168 hours.   Radiology/Studies:  Ct Abdomen Pelvis W Contrast  Result Date: 09/09/2018 CLINICAL DATA:  Epigastric pain EXAM: CT ABDOMEN AND PELVIS WITH CONTRAST TECHNIQUE: Multidetector CT imaging of the abdomen and pelvis was performed using the standard protocol following bolus administration of intravenous contrast. CONTRAST:  160mL OMNIPAQUE IOHEXOL 350 MG/ML SOLN COMPARISON:  September 03, 2013 FINDINGS: Lower chest: Visualized lung bases are clear. Hepatobiliary: A small portion of the dome of the liver is not visualized on this study. There is mild hepatic steatosis near the fissure for the ligamentum teres. There is a 4 mm probable cyst in the anterior segment right lobe of the liver. No other  focal liver lesions are appreciable. Gallbladder wall is not appreciably thickened. There is no biliary duct dilatation. Pancreas: There is no pancreatic mass or inflammatory focus. Spleen: No splenic lesions are evident. Adrenals/Urinary Tract: Adrenals bilaterally appear normal. Kidneys bilaterally show no evident mass or hydronephrosis on either side. There is a 1 mm calculus in the upper pole of the right kidney. There is no evident ureteral calculus on either side. Urinary bladder is midline with wall thickness within normal limits. Stomach/Bowel: There is no appreciable bowel wall or mesenteric thickening. There is no evident bowel obstruction. Terminal ileum appears unremarkable. There is no evident free air or portal venous air. Vascular/Lymphatic: There is aortic and iliac artery atherosclerosis. No evident aneurysm. No evident adenopathy in the abdomen or pelvis. Reproductive: Uterus is absent.  There is no evident pelvic mass. Other: The appendix is absent. There is no periappendiceal region inflammatory change. There is no abscess or ascites in the abdomen or pelvis. Musculoskeletal: There are foci of degenerative change in the lower thoracic and lumbar regions. There are no blastic or lytic bone lesions. There is no intramuscular or abdominal wall lesion. IMPRESSION: 1. 1 mm calculus upper pole right kidney. No hydronephrosis or ureteral calculus on either side. Urinary bladder wall thickness within normal limits. 2. No demonstrable bowel obstruction. No abscess in the abdomen or pelvis. Appendix absent. No periappendiceal region inflammation. 3.  Aortic and iliac artery atherosclerosis. 4.  Uterus absent. Electronically Signed   By: Lowella Grip III M.D.   On: 09/09/2018 11:34   Dg Chest Port 1 View  Result Date: 09/09/2018 CLINICAL DATA:  Cough and fever.  Chest pain beginning yesterday. EXAM: PORTABLE CHEST 1 VIEW COMPARISON:  One-view chest x-ray 11/05/2017. FINDINGS: Heart size is mildly  enlarged. Atherosclerotic changes are noted at the aortic arch. Chronic changes of COPD are present. Chronic interstitial coarsening is similar the prior studies. No superimposed disease is present. There is no edema or effusion. The visualized soft tissues and bony thorax are unremarkable. IMPRESSION: 1. No acute pulmonary disease or significant interval change. 2. Stable chronic changes of COPD. Electronically Signed   By: San Morelle M.D.   On: 09/09/2018 07:57    Assessment and Plan:   1. Cardiac Clearance for EGD for hematemisis/Elevated Troponin -Patient has cardiac  history of atypical chest pain and nonobstructive CAD with cath in 2011 showing mild to moderate disease, no stents placed.  Last Echo showed LVEF 65-70% with no wall motion abnormalities, mild MR, mild TR. Had not seen cardiology since 2014. -Patient presented with nausea and vomiting likely 2/2 to Opiate withdrawal. Evaluated by GI and thought she might have Mallory-Wise tear. Plan for EGD for r/o other etiologies -HS Troponin 33>48. Not found to be consistent with ACS. Possibly due to demand ischemia secondary to dehydration and drug use -METS score 6.45. Moderate activity level at baseline without chest pain, shortness of breath. -According to revised Cardiac Risk Index patient is at a low risk, 3.9% of death, MI, and cardiac arrest -Patient low risk candidate for a low risk procedure. Can likely proceed with EGD.   For questions or updates, please contact [place] Please consult [email or web address] for contact info under     Signed, Cadence Ninfa Meeker, PA-C  09/10/2018 3:32 PM   I have personally seen and examined this patient with Cadence Kathlen Mody, PA-C. I agree with the assessment and plan as outlined above. Ms. Hamidi is a 63 yo female with history of polysubstance abuse, tobacco abuse, mild CAD by cath in 2011 admitted with nausea, vomiting and abdominal pain. Due to coffee ground emesis, GI was consulted and is  considering an EGD. Troponin was checked and was mildly abnormal (high sensitivity troponin 33>48>33 which is the equivalent of an old troponin of 0.03 up to 0.04). Cardiology consulted due to the troponin.  She has had no chest pain. She is being treated for opiate withdrawal.  Labs reviewed by me. Urine drug screen positive for cocaine, opiates and marijuana).  EKG reviewed by me shows normal sinus rhythm with no ischemic changes.   My exam:  General: Well developed, well nourished, NAD  HEENT: OP clear, mucus membranes moist  SKIN: warm, dry. No rashes.  Neuro: No focal deficits  Musculoskeletal: Muscle strength 5/5 all ext  Psychiatric: Mood and affect normal  Neck: No JVD, no carotid bruits, no thyromegaly, no lymphadenopathy.  Lungs:Clear bilaterally, no wheezes, rhonci, crackles  Cardiovascular: Regular rate and rhythm. No murmurs, gallops or rubs.  Abdomen:Soft. Bowel sounds present. Non-tender.  Extremities: No lower extremity edema. Pulses are 2 + in the bilateral DP/PT.   Plan: Pt with coffee ground emesis and opiate withdrawal. Her high sensitivity troponin is in a range that is not consistent with acute coronary syndrome. EKG is normal. No anginal type symptoms. No further cardiac workup at this time. Would proceed with GI workup.  Tobacco and cocaine cessation discussion.   Lauree Chandler, MD 09/10/2018 5:09 PM

## 2018-09-11 ENCOUNTER — Inpatient Hospital Stay (HOSPITAL_COMMUNITY): Payer: Medicare Other | Admitting: Certified Registered"

## 2018-09-11 ENCOUNTER — Encounter (HOSPITAL_COMMUNITY)
Admission: EM | Disposition: A | Discharge status: Home / Self Care | Payer: Self-pay | Source: Home / Self Care | Attending: Internal Medicine

## 2018-09-11 ENCOUNTER — Encounter (HOSPITAL_COMMUNITY): Payer: Self-pay

## 2018-09-11 DIAGNOSIS — K209 Esophagitis, unspecified: Secondary | ICD-10-CM

## 2018-09-11 DIAGNOSIS — K228 Other specified diseases of esophagus: Secondary | ICD-10-CM

## 2018-09-11 DIAGNOSIS — K222 Esophageal obstruction: Secondary | ICD-10-CM

## 2018-09-11 HISTORY — PX: ESOPHAGOGASTRODUODENOSCOPY (EGD) WITH PROPOFOL: SHX5813

## 2018-09-11 HISTORY — PX: BIOPSY: SHX5522

## 2018-09-11 HISTORY — PX: SAVORY DILATION: SHX5439

## 2018-09-11 LAB — COMPREHENSIVE METABOLIC PANEL
ALT: 18 U/L (ref 0–44)
AST: 19 U/L (ref 15–41)
Albumin: 3.2 g/dL — ABNORMAL LOW (ref 3.5–5.0)
Alkaline Phosphatase: 54 U/L (ref 38–126)
Anion gap: 6 (ref 5–15)
BUN: 20 mg/dL (ref 8–23)
CO2: 24 mmol/L (ref 22–32)
Calcium: 8.8 mg/dL — ABNORMAL LOW (ref 8.9–10.3)
Chloride: 110 mmol/L (ref 98–111)
Creatinine, Ser: 1.04 mg/dL — ABNORMAL HIGH (ref 0.44–1.00)
GFR calc Af Amer: >60 mL/min (ref >60)
GFR calc non Af Amer: 57 mL/min — ABNORMAL LOW (ref >60)
Glucose, Bld: 116 mg/dL — ABNORMAL HIGH (ref 70–99)
Potassium: 3.8 mmol/L (ref 3.5–5.1)
Sodium: 140 mmol/L (ref 135–145)
Total Bilirubin: 0.6 mg/dL (ref 0.3–1.2)
Total Protein: 6 g/dL — ABNORMAL LOW (ref 6.5–8.1)

## 2018-09-11 LAB — CBC WITH DIFFERENTIAL/PLATELET
Abs Immature Granulocytes: 0.04 10*3/uL (ref 0.00–0.07)
Basophils Absolute: 0 10*3/uL (ref 0.0–0.1)
Basophils Relative: 0 %
Eosinophils Absolute: 0 10*3/uL (ref 0.0–0.5)
Eosinophils Relative: 0 %
HCT: 39.8 % (ref 36.0–46.0)
Hemoglobin: 13 g/dL (ref 12.0–15.0)
Immature Granulocytes: 0 %
Lymphocytes Relative: 25 %
Lymphs Abs: 2.4 10*3/uL (ref 0.7–4.0)
MCH: 28.6 pg (ref 26.0–34.0)
MCHC: 32.7 g/dL (ref 30.0–36.0)
MCV: 87.7 fL (ref 80.0–100.0)
Monocytes Absolute: 0.9 10*3/uL (ref 0.1–1.0)
Monocytes Relative: 9 %
Neutro Abs: 6.2 10*3/uL (ref 1.7–7.7)
Neutrophils Relative %: 66 %
Platelets: 226 10*3/uL (ref 150–400)
RBC: 4.54 MIL/uL (ref 3.87–5.11)
RDW: 13 % (ref 11.5–15.5)
WBC: 9.6 10*3/uL (ref 4.0–10.5)
nRBC: 0 % (ref 0.0–0.2)

## 2018-09-11 LAB — HEPATITIS B SURFACE ANTIBODY,QUALITATIVE: Hep B S Ab: NONREACTIVE

## 2018-09-11 LAB — HEPATITIS B SURFACE ANTIGEN: Hepatitis B Surface Ag: NEGATIVE

## 2018-09-11 LAB — MAGNESIUM: Magnesium: 2 mg/dL (ref 1.7–2.4)

## 2018-09-11 LAB — PHOSPHORUS: Phosphorus: 2.7 mg/dL (ref 2.5–4.6)

## 2018-09-11 LAB — HEPATITIS C ANTIBODY: HCV Ab: <0.1 s/co ratio (ref 0.0–0.9)

## 2018-09-11 SURGERY — ESOPHAGOGASTRODUODENOSCOPY (EGD) WITH PROPOFOL
Anesthesia: Monitor Anesthesia Care

## 2018-09-11 MED ORDER — LIDOCAINE HCL (CARDIAC) PF 100 MG/5ML IV SOSY
PREFILLED_SYRINGE | INTRAVENOUS | Status: DC | PRN
  Administered 2018-09-11: 50 mg via INTRAVENOUS

## 2018-09-11 MED ORDER — POTASSIUM CHLORIDE IN NACL 20-0.9 MEQ/L-% IV SOLN
INTRAVENOUS | Status: AC
  Filled 2018-09-11: qty 1000

## 2018-09-11 MED ORDER — GLYCOPYRROLATE 0.2 MG/ML IJ SOLN
INTRAMUSCULAR | Status: DC | PRN
  Administered 2018-09-11: 0.2 mg via INTRAVENOUS

## 2018-09-11 MED ORDER — LORAZEPAM 2 MG/ML IJ SOLN
1.0000 mg | Freq: Once | INTRAMUSCULAR | Status: AC
  Administered 2018-09-11: 19:00:00 1 mg via INTRAVENOUS

## 2018-09-11 MED ORDER — DEXMEDETOMIDINE HCL 200 MCG/2ML IV SOLN
INTRAVENOUS | Status: DC | PRN
  Administered 2018-09-11: 8 ug via INTRAVENOUS
  Administered 2018-09-11: 12 ug via INTRAVENOUS
  Administered 2018-09-11 (×2): 8 ug via INTRAVENOUS
  Administered 2018-09-11: 4 ug via INTRAVENOUS

## 2018-09-11 MED ORDER — ONDANSETRON HCL 4 MG/2ML IJ SOLN
INTRAMUSCULAR | Status: DC | PRN
  Administered 2018-09-11: 4 mg via INTRAVENOUS

## 2018-09-11 MED ORDER — LORAZEPAM 2 MG/ML IJ SOLN
INTRAMUSCULAR | Status: AC
  Filled 2018-09-11: qty 1

## 2018-09-11 MED ORDER — LACTATED RINGERS IV SOLN
INTRAVENOUS | Status: DC | PRN
  Administered 2018-09-11: 13:00:00 via INTRAVENOUS

## 2018-09-11 MED ORDER — PROPOFOL 500 MG/50ML IV EMUL
INTRAVENOUS | Status: DC | PRN
  Administered 2018-09-11: 75 ug/kg/min via INTRAVENOUS

## 2018-09-11 MED ORDER — PROPOFOL 10 MG/ML IV BOLUS
INTRAVENOUS | Status: DC | PRN
  Administered 2018-09-11: 20 mg via INTRAVENOUS
  Administered 2018-09-11: 60 mg via INTRAVENOUS

## 2018-09-11 MED ORDER — ONDANSETRON HCL 4 MG/2ML IJ SOLN
INTRAMUSCULAR | Status: AC
  Administered 2018-09-11: 8 mg
  Filled 2018-09-11: qty 4

## 2018-09-11 SURGICAL SUPPLY — 15 items

## 2018-09-11 NOTE — Anesthesia Preprocedure Evaluation (Signed)
Anesthesia Evaluation  Patient identified by MRN, date of birth, ID band Patient awake    Reviewed: Allergy & Precautions, NPO status , Patient's Chart, lab work & pertinent test results  Airway Mallampati: II  TM Distance: >3 FB Neck ROM: Full    Dental no notable dental hx.    Pulmonary asthma , COPD,  COPD inhaler, Current Smoker,    Pulmonary exam normal breath sounds clear to auscultation       Cardiovascular hypertension, Normal cardiovascular exam Rhythm:Regular Rate:Normal     Neuro/Psych Anxiety Depression she has been taking fentanyl which is not prescribed for her for her right lower back pain  May be experiencing withdrawal from fentanyl    GI/Hepatic Neg liver ROS, GERD  ,  Endo/Other  negative endocrine ROS  Renal/GU negative Renal ROS  negative genitourinary   Musculoskeletal  (+) Arthritis , Rheumatoid disorders,  Fibromyalgia -  Abdominal   Peds negative pediatric ROS (+)  Hematology negative hematology ROS (+)   Anesthesia Other Findings   Reproductive/Obstetrics negative OB ROS                             Anesthesia Physical Anesthesia Plan  ASA: III  Anesthesia Plan: MAC   Post-op Pain Management:    Induction: Intravenous  PONV Risk Score and Plan: 0  Airway Management Planned: Simple Face Mask  Additional Equipment:   Intra-op Plan:   Post-operative Plan:   Informed Consent: I have reviewed the patients History and Physical, chart, labs and discussed the procedure including the risks, benefits and alternatives for the proposed anesthesia with the patient or authorized representative who has indicated his/her understanding and acceptance.     Dental advisory given  Plan Discussed with: CRNA and Surgeon  Anesthesia Plan Comments: (If patient is still having emesis will do GETA with RSI)        Anesthesia Quick Evaluation

## 2018-09-11 NOTE — Anesthesia Procedure Notes (Signed)
Procedure Name: MAC Date/Time: 09/11/2018 12:59 PM Performed by: Mariea Clonts, CRNA Pre-anesthesia Checklist: Patient identified, Emergency Drugs available, Suction available, Patient being monitored and Timeout performed Patient Re-evaluated:Patient Re-evaluated prior to induction Oxygen Delivery Method: Nasal cannula

## 2018-09-11 NOTE — Progress Notes (Signed)
Laurie D. UNHRVA([id number]) HR staying around 40 .37 is lowest . BP is 122/46. She says she has been feeling faint. Oncall provider notified via [place].

## 2018-09-11 NOTE — Transfer of Care (Signed)
Immediate Anesthesia Transfer of Care Note  Patient: Laurie [name]  Procedure(s) Performed: ESOPHAGOGASTRODUODENOSCOPY (EGD) WITH PROPOFOL (N/A ) BIOPSY SAVORY DILATION (N/A )  Patient Location: Endoscopy Unit  Anesthesia Type:MAC  Level of Consciousness: awake, alert  and oriented  Airway & Oxygen Therapy: Patient Spontanous Breathing and Patient connected to nasal cannula oxygen  Post-op Assessment: Report given to RN and Post -op Vital signs reviewed and stable  Post vital signs: Reviewed and stable  Last Vitals:  Vitals Value Taken Time  BP 157/63 09/11/18 1341  Temp 37.2 C 09/11/18 1341  Pulse 66 09/11/18 1341  Resp 26 09/11/18 1341  SpO2 97 % 09/11/18 1341    Last Pain:  Vitals:   09/11/18 1341  TempSrc: Axillary  PainSc:          Complications: No apparent anesthesia complications

## 2018-09-11 NOTE — Op Note (Signed)
[facility] Patient Name: [place] Procedure Date : 09/11/2018 MRN: [id number] Attending MD: Justice Britain , MD Date of Birth: 1955/06/05 CSN: [id number] Age: 63 Admit Type: Inpatient Procedure:                Upper GI endoscopy Indications:              Dysphagia, Coffee-ground emesis, Hematemesis,                            Nausea with vomiting, Family History Esophageal                            cancer Providers:                Justice Britain, MD, Carlyn Reichert, RN, Elspeth Cho Tech., Technician, Virgilio Belling. Huel Cote, CRNA Referring MD:             Hospital Team Medicines:                Monitored Anesthesia Care Complications:            No immediate complications. Estimated Blood Loss:     Estimated blood loss was minimal. Procedure:                Pre-Anesthesia Assessment:                           - Prior to the procedure, a History and Physical                            was performed, and patient medications and                            allergies were reviewed. The patient's tolerance of                            previous anesthesia was also reviewed. The risks                            and benefits of the procedure and the sedation                            options and risks were discussed with the patient.                            All questions were answered, and informed consent                            was obtained. Prior Anticoagulants: The patient has                            taken no previous anticoagulant or antiplatelet  agents. ASA Grade Assessment: III - A patient with                            severe systemic disease. After reviewing the risks                            and benefits, the patient was deemed in                            satisfactory condition to undergo the procedure.                           After obtaining informed consent, the endoscope was                      passed under direct vision. Throughout the                            procedure, the patient's blood pressure, pulse, and                            oxygen saturations were monitored continuously. The                            [id number]-H190 ([id number]) Olympus gastroscope was                            introduced through the mouth, and advanced to the                            second part of duodenum. The upper GI endoscopy was                            accomplished without difficulty. The patient                            tolerated the procedure. Scope In: Scope Out: Findings:      No gross lesions were noted in the proximal esophagus, in the mid       esophagus and in the distal esophagus.      A widely patent and non-obstructing Schatzki ring was found at the       gastroesophageal junction. Biopsies were taken with a cold forceps for       histology to disrupt this.      LA Grade B (one or more mucosal breaks greater than 5 mm, not extending       between the tops of two mucosal folds) esophagitis was found at the       gastroesophageal junction.      The Z-line was irregular and was found 41 cm from the incisors.      A small hiatal hernia was present.      Striped mildly erythematous mucosa without bleeding was found in the       gastric antrum.      A few dispersed, diminutive non-bleeding erosions were found in the       gastric antrum. There were no  stigmata of recent bleeding.      No other gross lesions were noted in the entire examined stomach.       Biopsies were taken with a cold forceps for histology and Helicobacter       pylori testing.      No gross lesions were noted in the duodenal bulb, in the first portion       of the duodenum and in the second portion of the duodenum. Biopsies were       taken with a cold forceps for histology.      After completion of EGD evaluation, a guidewire was placed and the scope       was withdrawn. Dilation was  performed in the entire esophagus with a       Savary dilator with no resistance at 15 mm, mild resistance at 16 mm and       moderate resistance at 17 mm. The dilation site was examined following       endoscope reinsertion and showed mild mucosal disruption, mild       improvement in luminal narrowing and no perforation. Impression:               - Widely patent and non-obstructing Schatzki ring.                            Biopsied.                           - LA Grade B esophagitis distally.                           - Z-line irregular, 41 cm from the incisors.                           - Dilation performed in the entire esophagus.                           - Small hiatal hernia.                           - Erythematous mucosa in the antrum. Non-bleeding                            erosive gastropathy. Biopsied for HP.                           - No gross lesions in the duodenal bulb, in the                            first portion of the duodenum and in the second                            portion of the duodenum. Biopsied. Recommendation:           - The patient will be observed post-procedure,                            until all discharge criteria are met.                           -  Return patient to hospital ward for ongoing care.                           - PPI BID x 58-month.                           - Repeat EGD in 33-monthto check on healing.                           - If patient has persistent symptoms consider at                            time of EGD balloon dilation vs repeat Savary and                            then consider Manometry.                           - Await pathology results.                           - The findings and recommendations were discussed                            with the patient.                           - The findings and recommendations were discussed                            with the Hospital Team. Procedure Code(s):        ---  Professional ---                           [id number]py, flexible,                            transoral; with insertion of guide wire followed by                            passage of dilator(s) through esophagus over guide                            wire Diagnosis Code(s):        --- Professional ---                           K22.2, Esophageal obstruction                           K20.9, Esophagitis, unspecified                           K22.8, Other specified diseases of esophagus                           K31.89, Other  diseases of stomach and duodenum                           R13.10, Dysphagia, unspecified                           K92.0, Hematemesis                           R11.2, Nausea with vomiting, unspecified CPT copyright 2019 American Medical Association. All rights reserved. The codes documented in this report are preliminary and upon coder review may  be revised to meet current compliance requirements. Justice Britain, MD 09/11/2018 1:41:44 PM Number of Addenda: 0

## 2018-09-11 NOTE — Interval H&P Note (Signed)
History and Physical Interval Note:  09/11/2018 12:52 PM  Laurie [name]  has presented today for surgery, with the diagnosis of Epigastric pain, gastric occult blood positive brown emesis..  The various methods of treatment have been discussed with the patient and family. After consideration of risks, benefits and other options for treatment, the patient has consented to  Procedure(s): ESOPHAGOGASTRODUODENOSCOPY (EGD) WITH PROPOFOL (N/A) as a surgical intervention.  The patient's history has been reviewed, patient examined, no change in status, stable for surgery.  I have reviewed the patient's chart and labs.  Questions were answered to the patient's satisfaction.     [employer]

## 2018-09-11 NOTE — Progress Notes (Signed)
PROGRESS NOTE    Laurie [name]  RDE:[id number] DOB: 05-01-1955 DOA: 09/09/2018 PCP: [name], MD   Brief Narrative:  HPI per Dr. Randa Spike on 09/09/2018 Laurie [name] is a 63 y.o. female with medical history significant of hypertension, hyperlipidemia, rheumatoid arthritis who presents the emergency department complaining of nausea vomiting abdominal pain.  Her symptoms started yesterday.  She has had no blood in her emesis or stools and denied recent travel or sick contacts she reports a nonproductive cough and she is a very reluctant historian.  She notes some sweats denies chills no known fevers temperature on arrival 100.3.  No known exposure to COVID-19.  She has diffuse abdominal pain and not able to answer if it makes anything better or worse.  She has had similar pain in the past but will not discuss it.  She was given Zofran by EMS and reports ongoing severe nausea with no recent vomiting.  She has had prior abdominal surgeries including a hysterectomy and appendectomy.  She is a daily smoker and also smokes marijuana.  In the ED white blood cell count was 16.2 and CMP was unremarkable.  COVID-19 was negative, chest x-ray shows COPD, after several hours of evaluation she tells the ED practitioner that she has been taking fentanyl which is not prescribed for her for her right lower back pain.  She states that the pain radiates down her right leg.  She has a known history of spinal disorders.  She has not been to her PCP for this and denies any falls or injuries resulting from the pain.  She ran out of fentanyl 2 days ago.  Think she may be withdrawing from the fentanyl.  Initial plan was to discharge the patient home with clonidine and Zofran with close follow-up with her PCP.  As patient was in the process of being discharged she began to vomit and her emesis was red to brown and positive for blood on Hemoccult.  Was then referred to me for further evaluation and management.   GI was also consulted.  They recommended PPI initiation and plan on endoscopy tomorrow to evaluate her esophagus and stomach and small intestine.  Patient is agreeable to the plan.  ED Course: As noted above.  Hemoglobin stable at 14 labs unremarkable but she is having occult positive emesis.  Also appears to be withdrawing.  Had difficulty keeping her clonidine down.  Required more Zofran.  **Interim History  Withdrawal protocol was started and patient placed on clonidine taper but she is still having some withdrawal symptoms this morning that were abated by hydroxyzine.  EGD was to be done however was canceled yesterday because of elevated troponin.  EKG showed normal sinus rhythm however we will repeat and trend troponins and GI has requested Cardiology input so we have consulted cardiology.  Cardiology evaluated and cleared patient for EGD procedure today. Patient still complained of some nausea but vomiting is improved so we will continue IV fluid hydration given risk of dehydration and advance diet as tolerated once patient is okay to do so by GI.  Assessment & Plan:   Principal Problem:   Hematemesis Active Problems:   Depression, recurrent (HCC)   LOW BACK PAIN   Reflux esophagitis   Polysubstance (excluding opioids) dependence, daily use (HCC)   AKI (acute kidney injury) (HCC)   Opiate withdrawal (HCC)   Renal insufficiency   Elevated troponin   Leukocytosis  Hematemesis -Likely related to nausea and vomiting.   -She  most likely has a Mallory-Weiss tear to rule out peptic ulcer disease and esophageal gastric malignancies as well as to look for lesions and ectasias etc.   -She has been seen by GI who is planning esophagogastroduodenoscopy yesterday but however this was cancelled due to Elevated Troponin; Will check EKG and call Cardiology for Evaluation for candidacy for Anesthesia per GI request; They have rescheduled EGD for Friday and this will be done today -She was started on  Protonix 40 mg q12h we will continue -She will be n.p.o. except for meds now but she will be going for procedure -C/w Supportive Care and C/w Antiemetics with ondansetron 8 mg IV every 6 PRN for nausea and 4 mg p.o. every 6 as needed nausea and vomiting; -He was also given a dose of promethazine 12.5 mg once early this morning -FOBT was positive -Awaiting further GI evaluation and recommendations  Nausea and Vomiting, slightly improving -Likely 2/2 to Opiate Withdrawal -C/w IV fluids with normal saline +20 mEq of KCl at a rate of 100 mL's per hour for 24 hours at least and will resume again today -Antiemetics as above  Opiate Withdrawal -Patient has been obtaining street fentanyl.   -UDS done on positive for opiates, cocaine, THC -She does not know how much she has been taking.  She is somewhat reluctant to give history.   -Started her on a Clonidine protocol and she is on clonidine 0.1 mg p.o. 4 times daily for today and then transitioning to 0.1 mg in the a.m.and at bedtime for 4 doses and then 0.1 mg p.o. daily for breakfast for 2 days -Continued Bentyl 20 mg p.o. q. 6 as needed for spasms abdominal cramping -Continue with Hydroxyzine 25 mg p.o. every 6 as needed anxiety; had to receive a dose this morning -Started patient on Lidocaine patch 1 patch transdermally every 12 hours -Continue with Robaxin 500 mg p.o. every 8 as needed for muscle spasms -Continue with ondansetron for antiemetics  Low Back Pain -Acetaminophen has been ordered for pain.   -Given GI bleeding will not start ibuprofen and discontinue naproxen that was ordered with clonidine protocol.   -Other medications are addictive and would like to hold off on those for now. -Continue with lidocaine patch -Continue with clonidine withdrawal protocol as above  Depression -Continue Venlafaxine 150 mg po Daily   Elevated Troponin -Was initially normal but started trending upwards and is now 48 and has now trended down  high-sensitivity; likely in the setting of polysubstance abuse -We will repeat x2 and trending down and last one was 27 -EKG ordered and was a normal sinus rhythm at a rate of 65 and a QTC of 457 -Cardiology will be consulted at the request of Gastroenterology for further evaluation recommendations and they recommended no further work-up and follow-up.  Polysubstance Abuse -UDS was positive for opiates, cocaine, THC -Counseling given today again -Hepatitis Panel was negative  -We will need to monitor carefully  Leukocytosis -Trending down in the setting of hematemesis nausea vomiting from opiate withdrawal and is now improved -Patient's WBC went from 19.8 -> 12.8 -> 9.6 -Continue to monitor for signs and symptoms of infection -Repeat CBC in a.m.  Renal Insufficiency -Patient's BUN/creatinine went from 16/0.97 -> 12/1.10 -> 20/1.04 -Avoid nephrotoxic medications, contrast dyes, and hypotension -Continue with normal saline rate of 100 and mils per hour +40 mEq of KCl -Continue monitor and trend Renal Function -Repeat CMP in a.m.  Hyperglycemia -Patient's blood sugar on admission was 147 and repeat  this morning on CMP was 121 -Check HbA1c in AM  -Continue to Monitor and Trend Blood Glucose Carefully and if Necessary will place on Sensitive Novolog SSI AC  Bradycardia -She is currently asymptomatic and likely secondary to cocaine -Not on any beta blocking agents will need to continue monitor carefully -Continue to monitor on Telemetry  DVT prophylaxis: SCDs Code Status: FULL CODE  Family Communication: No family present at bedside Disposition Plan: Pending further work-up and evaluation by Cardiology and EGD done by Gastroenterology  Consultants:   Gastroenterology   Cardiology   Procedures:  EGD to be done later today  Antimicrobials:  Anti-infectives (From admission, onward)   None     Subjective: Seen and examined at bedside and felt like she was withdrawing  early this morning as she started having shakes and was complaining of shortness of breath.  Nurse checked her O2 saturation and was 98% on room air.  She calm down now into the room and still complained of some nausea but no vomiting.  States that she feels a little bit better but is awaiting the EGD.  No other concerns or complaints at this time.  Objective: Vitals:   09/10/18 1519 09/10/18 2028 09/11/18 0449 09/11/18 0726  BP: (!) 156/68 102/64 (!) 122/46 (!) 143/68  Pulse: 66 67  (!) 51  Resp: 14 17  16   Temp: 98.8 F (37.1 C) 98.7 F (37.1 C)  98 F (36.7 C)  TempSrc: Oral Oral  Oral  SpO2: 97%  99% 100%    Intake/Output Summary (Last 24 hours) at 09/11/2018 1148 Last data filed at 09/11/2018 0936 Gross per 24 hour  Intake 1373.33 ml  Output --  Net 1373.33 ml   There were no vitals filed for this visit.  Examination: Physical Exam:  Constitutional: Well-nourished, well-developed older than appearing stated age 74 female currently resting in bed appears in no acute distress but does appear somewhat uncomfortable. Eyes: Lids and conjunctive are normal.  Sclera anicteric ENMT: External ears nose appear normal.  Grossly normal hearing Neck: Appears supple no JVD Respiratory: Clear to auscultation bilaterally with no appreciable wheezing, rales, capital patient not tachypneic wheezing and accessory muscles to breathe; she was wearing supplemental oxygen via nasal cannula but this was for comfort and I told the nurse to remove it Cardiovascular: Regular rate and rhythm.  No appreciable murmurs, rubs, gallops Abdomen: Soft, nontender, nondistended on limited skin evaluation.  Bowel sounds present GU: Deferred Musculoskeletal: No contractures or cyanosis.  No joint deformities in upper and lower extremities Skin: Skin is warm and dry no appreciable rashes or lesions on to skin evaluation Neurologic: Cranial nerves II through XII gross intact no appreciable focal  deficits. Psychiatric: Normal judgment and insight.  Patient is awake, alert, oriented and resting.  She remains somewhat anxious  Data Reviewed: I have personally reviewed following labs and imaging studies  CBC: Recent Labs  Lab 09/09/18 0822 09/09/18 1345 09/09/18 2002 09/10/18 0354 09/11/18 0320  WBC 16.2* 19.8*  --  12.8* 9.6  NEUTROABS 14.5*  --   --   --  6.2  HGB 14.9 14.7 14.4 14.6 13.0  HCT 45.1 44.0 42.2 43.9 39.8  MCV 86.7 86.4  --  86.8 87.7  PLT 297 301  --  266 580   Basic Metabolic Panel: Recent Labs  Lab 09/09/18 0822 09/10/18 0354 09/11/18 0320  NA 137 138 140  K 3.7 3.8 3.8  CL 99 104 110  CO2 23 23 24  GLUCOSE 147* 121* 116*  BUN 16 12 20   CREATININE 0.97 1.10* 1.04*  CALCIUM 9.8 9.4 8.8*  MG  --  2.0 2.0  PHOS  --  3.0 2.7   GFR: CrCl cannot be calculated (Unknown ideal weight.). Liver Function Tests: Recent Labs  Lab 09/09/18 0822 09/10/18 0354 09/11/18 0320  AST 17 31 19   ALT 17 17 18   ALKPHOS 85 74 54  BILITOT 0.8 0.7 0.6  PROT 7.8 7.4 6.0*  ALBUMIN 4.0 4.1 3.2*   Recent Labs  Lab 09/09/18 0822  LIPASE 37   No results for input(s): AMMONIA in the last 168 hours. Coagulation Profile: No results for input(s): INR, PROTIME in the last 168 hours. Cardiac Enzymes: No results for input(s): CKTOTAL, CKMB, CKMBINDEX, TROPONINI in the last 168 hours. BNP (last 3 results) No results for input(s): PROBNP in the last 8760 hours. HbA1C: No results for input(s): HGBA1C in the last 72 hours. CBG: No results for input(s): GLUCAP in the last 168 hours. Lipid Profile: No results for input(s): CHOL, HDL, LDLCALC, TRIG, CHOLHDL, LDLDIRECT in the last 72 hours. Thyroid Function Tests: No results for input(s): TSH, T4TOTAL, FREET4, T3FREE, THYROIDAB in the last 72 hours. Anemia Panel: No results for input(s): VITAMINB12, FOLATE, FERRITIN, TIBC, IRON, RETICCTPCT in the last 72 hours. Sepsis Labs: Recent Labs  Lab 09/09/18 0718   LATICACIDVEN 1.9    Recent Results (from the past 240 hour(s))  SARS Coronavirus 2 (CEPHEID- Performed in [facility] hospital lab), Hosp Order     Status: None   Collection Time: 09/09/18  8:22 AM   Specimen: Nasopharyngeal Swab  Result Value Ref Range Status   SARS Coronavirus 2 NEGATIVE NEGATIVE Final    Comment: (NOTE) If result is NEGATIVE SARS-CoV-2 target nucleic acids are NOT DETECTED. The SARS-CoV-2 RNA is generally detectable in upper and lower  respiratory specimens during the acute phase of infection. The lowest  concentration of SARS-CoV-2 viral copies this assay can detect is 250  copies / mL. A negative result does not preclude SARS-CoV-2 infection  and should not be used as the sole basis for treatment or other  patient management decisions.  A negative result may occur with  improper specimen collection / handling, submission of specimen other  than nasopharyngeal swab, presence of viral mutation(s) within the  areas targeted by this assay, and inadequate number of viral copies  (<250 copies / mL). A negative result must be combined with clinical  observations, patient history, and epidemiological information. If result is POSITIVE SARS-CoV-2 target nucleic acids are DETECTED. The SARS-CoV-2 RNA is generally detectable in upper and lower  respiratory specimens dur ing the acute phase of infection.  Positive  results are indicative of active infection with SARS-CoV-2.  Clinical  correlation with patient history and other diagnostic information is  necessary to determine patient infection status.  Positive results do  not rule out bacterial infection or co-infection with other viruses. If result is PRESUMPTIVE POSTIVE SARS-CoV-2 nucleic acids MAY BE PRESENT.   A presumptive positive result was obtained on the submitted specimen  and confirmed on repeat testing.  While 2019 novel coronavirus  (SARS-CoV-2) nucleic acids may be present in the submitted sample   additional confirmatory testing may be necessary for epidemiological  and / or clinical management purposes  to differentiate between  SARS-CoV-2 and other Sarbecovirus currently known to infect humans.  If clinically indicated additional testing with an alternate test  methodology [phone]) is advised. The SARS-CoV-2 RNA is  generally  detectable in upper and lower respiratory sp ecimens during the acute  phase of infection. The expected result is Negative. Fact Sheet for Patients:  StrictlyIdeas.no Fact Sheet for Healthcare Providers: BankingDealers.co.za This test is not yet approved or cleared by the [place] FDA and has been authorized for detection and/or diagnosis of SARS-CoV-2 by FDA under an Emergency Use Authorization (EUA).  This EUA will remain in effect (meaning this test can be used) for the duration of the COVID-19 declaration under Section 564(b)(1) of the Act, 21 U.S.C. section 360bbb-3(b)(1), unless the authorization is terminated or revoked sooner. Performed at [facility], [place] [address], [place], [place] [address]    Radiology Studies: No results found. Scheduled Meds:  cloNIDine  0.1 mg Oral QID   Followed by   Derrill Memo ON 09/12/2018] cloNIDine  0.1 mg Oral BH-qamhs   Followed by   Derrill Memo ON 09/14/2018] cloNIDine  0.1 mg Oral QAC breakfast   lidocaine  1 patch Transdermal Q24H   pantoprazole  40 mg Intravenous Q12H   pneumococcal 23 valent vaccine  0.5 mL Intramuscular Tomorrow-1000   sodium chloride flush  3 mL Intravenous Q12H   venlafaxine XR  150 mg Oral Q breakfast   Continuous Infusions:  0.9 % NaCl with KCl 20 mEq / L     ondansetron (ZOFRAN) IV 8 mg (09/10/18 0546)    LOS: 1 day   Kerney Elbe, DO [facility] PAGER is on [place]  If 7PM-7AM, please contact night-coverage [email or web address] Password [facility] 09/11/2018, 11:48 AM

## 2018-09-12 LAB — CBC WITH DIFFERENTIAL/PLATELET
Abs Immature Granulocytes: 0.04 10*3/uL (ref 0.00–0.07)
Basophils Absolute: 0.1 10*3/uL (ref 0.0–0.1)
Basophils Relative: 0 %
Eosinophils Absolute: 0 10*3/uL (ref 0.0–0.5)
Eosinophils Relative: 0 %
HCT: 41 % (ref 36.0–46.0)
Hemoglobin: 13.8 g/dL (ref 12.0–15.0)
Immature Granulocytes: 0 %
Lymphocytes Relative: 15 %
Lymphs Abs: 1.7 10*3/uL (ref 0.7–4.0)
MCH: 28.8 pg (ref 26.0–34.0)
MCHC: 33.7 g/dL (ref 30.0–36.0)
MCV: 85.4 fL (ref 80.0–100.0)
Monocytes Absolute: 0.7 10*3/uL (ref 0.1–1.0)
Monocytes Relative: 6 %
Neutro Abs: 9.3 10*3/uL — ABNORMAL HIGH (ref 1.7–7.7)
Neutrophils Relative %: 79 %
Platelets: UNDETERMINED 10*3/uL (ref 150–400)
RBC: 4.8 MIL/uL (ref 3.87–5.11)
RDW: 12.4 % (ref 11.5–15.5)
WBC: 11.8 10*3/uL — ABNORMAL HIGH (ref 4.0–10.5)
nRBC: 0 % (ref 0.0–0.2)

## 2018-09-12 LAB — COMPREHENSIVE METABOLIC PANEL
ALT: 24 U/L (ref 0–44)
AST: 25 U/L (ref 15–41)
Albumin: 3.6 g/dL (ref 3.5–5.0)
Alkaline Phosphatase: 63 U/L (ref 38–126)
Anion gap: 11 (ref 5–15)
BUN: 15 mg/dL (ref 8–23)
CO2: 23 mmol/L (ref 22–32)
Calcium: 8.9 mg/dL (ref 8.9–10.3)
Chloride: 104 mmol/L (ref 98–111)
Creatinine, Ser: 0.99 mg/dL (ref 0.44–1.00)
GFR calc Af Amer: >60 mL/min (ref >60)
GFR calc non Af Amer: >60 mL/min (ref >60)
Glucose, Bld: 111 mg/dL — ABNORMAL HIGH (ref 70–99)
Potassium: 3.8 mmol/L (ref 3.5–5.1)
Sodium: 138 mmol/L (ref 135–145)
Total Bilirubin: 0.9 mg/dL (ref 0.3–1.2)
Total Protein: 6.6 g/dL (ref 6.5–8.1)

## 2018-09-12 LAB — MAGNESIUM: Magnesium: 1.9 mg/dL (ref 1.7–2.4)

## 2018-09-12 LAB — PHOSPHORUS: Phosphorus: 3.5 mg/dL (ref 2.5–4.6)

## 2018-09-12 MED ORDER — PANTOPRAZOLE SODIUM 40 MG PO TBEC
40.0000 mg | DELAYED_RELEASE_TABLET | Freq: Two times a day (BID) | ORAL | Status: DC
  Administered 2018-09-12 – 2018-09-18 (×13): 40 mg via ORAL
  Filled 2018-09-12 (×14): qty 1

## 2018-09-12 MED ORDER — DIPHENHYDRAMINE HCL 50 MG/ML IJ SOLN
25.0000 mg | Freq: Once | INTRAMUSCULAR | Status: AC
  Administered 2018-09-12: 25 mg via INTRAVENOUS
  Filled 2018-09-12: qty 1

## 2018-09-12 MED ORDER — PROMETHAZINE HCL 25 MG/ML IJ SOLN
25.0000 mg | Freq: Once | INTRAMUSCULAR | Status: AC
  Administered 2018-09-12: 22:00:00 25 mg via INTRAVENOUS

## 2018-09-12 MED ORDER — ENALAPRILAT 1.25 MG/ML IV SOLN
1.2500 mg | Freq: Once | INTRAVENOUS | Status: AC
  Administered 2018-09-12: 1.25 mg via INTRAVENOUS
  Filled 2018-09-12: qty 1

## 2018-09-12 MED ORDER — PROMETHAZINE HCL 25 MG/ML IJ SOLN
12.5000 mg | Freq: Four times a day (QID) | INTRAMUSCULAR | Status: DC | PRN
  Administered 2018-09-12 – 2018-09-13 (×5): 12.5 mg via INTRAVENOUS
  Filled 2018-09-12 (×6): qty 1

## 2018-09-12 MED ORDER — SODIUM CHLORIDE 0.9 % IV SOLN
INTRAVENOUS | Status: AC
  Administered 2018-09-12: 14:00:00 via INTRAVENOUS

## 2018-09-12 NOTE — Progress Notes (Signed)
PROGRESS NOTE    Laurie [name]  YWV:[id number] DOB: Jul 09, 1955 DOA: 09/09/2018 PCP: [name], MD   Brief Narrative:  HPI per Dr. Randa Spike on 09/09/2018 Laurie [name] is a 63 y.o. female with medical history significant of hypertension, hyperlipidemia, rheumatoid arthritis who presents the emergency department complaining of nausea vomiting abdominal pain.  Her symptoms started yesterday.  She has had no blood in her emesis or stools and denied recent travel or sick contacts she reports a nonproductive cough and she is a very reluctant historian.  She notes some sweats denies chills no known fevers temperature on arrival 100.3.  No known exposure to COVID-19.  She has diffuse abdominal pain and not able to answer if it makes anything better or worse.  She has had similar pain in the past but will not discuss it.  She was given Zofran by EMS and reports ongoing severe nausea with no recent vomiting.  She has had prior abdominal surgeries including a hysterectomy and appendectomy.  She is a daily smoker and also smokes marijuana.  In the ED white blood cell count was 16.2 and CMP was unremarkable.  COVID-19 was negative, chest x-ray shows COPD, after several hours of evaluation she tells the ED practitioner that she has been taking fentanyl which is not prescribed for her for her right lower back pain.  She states that the pain radiates down her right leg.  She has a known history of spinal disorders.  She has not been to her PCP for this and denies any falls or injuries resulting from the pain.  She ran out of fentanyl 2 days ago.  Think she may be withdrawing from the fentanyl.  Initial plan was to discharge the patient home with clonidine and Zofran with close follow-up with her PCP.  As patient was in the process of being discharged she began to vomit and her emesis was red to brown and positive for blood on Hemoccult.  Was then referred to me for further evaluation and management.   GI was also consulted.  They recommended PPI initiation and plan on endoscopy tomorrow to evaluate her esophagus and stomach and small intestine.  Patient is agreeable to the plan.  ED Course: As noted above.  Hemoglobin stable at 14 labs unremarkable but she is having occult positive emesis.  Also appears to be withdrawing.  Had difficulty keeping her clonidine down.  Required more Zofran.  **Interim History  Withdrawal protocol was started and patient placed on clonidine taper but she is still having some withdrawal symptoms continued to be nauseous.  EGD was to be done however was canceled the day before yesterday because of elevated troponin.  EKG showed normal sinus rhythm however we will repeat and trend troponins and GI has requested Cardiology input so we have consulted cardiology.  Cardiology evaluated and cleared patient for EGD procedure and this was done yesterday.  EGD showed non-bleeding erosive gastropathy as well as esophagitis.  GI recommended placing the patient on PPI twice daily.  Patient continued to be to nauseous and Zofran was ineffective so started patient on Phenergan.  Diet was advanced to heart healthy diet however she is unable to tolerate this so we will start off on clears again.  Assessment & Plan:   Principal Problem:   Hematemesis Active Problems:   Depression, recurrent (HCC)   LOW BACK PAIN   Reflux esophagitis   Polysubstance (excluding opioids) dependence, daily use (HCC)   AKI (acute kidney injury) (  [place])   Opiate withdrawal (HCC)   Renal insufficiency   Elevated troponin   Leukocytosis   Hematemesis setting of esophagitis as well as nonbleeding erosive gastropathy -Initially thought to be related to nausea and vomiting.   -Thought to have a Mallory-Weiss tear but needed to rule out peptic ulcer disease and esophageal gastric malignancies as well as to look for lesions and ectasias etc.   -She was started on Protonix 40 mg q12h we will continue and  changed to p.o. twice daily -Placed on a heart healthy diet -C/w Supportive Care and C/w Antiemetics with ondansetron 8 mg IV every 6 PRN for nausea and 4 mg p.o. every 6 as needed nausea and vomiting; -He was also given a dose of promethazine 12.5 mg once early this morning -FOBT was positive -GI evaluated and did an endoscopy on 09/11/2018 which showed a widely patent and nonobstructing Schatzki ring which was biopsied.  Patient also had LA grade B esophagitis distally and a Z line that was regular and 41 cm from the incisors.  Dilatation was performed in the entire esophagus and she also had a small hiatal hernia.  She is noted to have erythematous mucosa in the antrum with nonbleeding erosive gastropathy which was biopsied for H. pylori.  There is no gross lesions in the duodenal bulb and the first and second portion of the duodenum -GI recommending PPI twice daily for 2 months repeat an EGD in 3 months to check on healing as the patient has persistent symptoms c considering at that time of the EGD balloon dilatation versus repeat Savary and considering manometry -We will need to follow-up on pathology results and advance diet as tolerated  Nausea and Vomiting -Likely 2/2 to Opiate Withdrawal -IVF had been D/C'd but had to be resumed as her po Intake is not sufficient and that she is still having Nausea -Antiemetics as above -Added IV Phenergan 12.5 mcg q6hprn for Refractory Nausea and Vomiting  Opiate Withdrawal -Patient has been obtaining street fentanyl.   -UDS done on positive for opiates, cocaine, THC -She does not know how much she has been taking.  She is somewhat reluctant to give history.   -Started her on a Clonidine protocol and now on 0.1 mg in the a.m.and at bedtime for 4 doses and then 0.1 mg p.o. daily for breakfast for 2 days -Continued Bentyl 20 mg p.o. q. 6 as needed for spasms abdominal cramping -Continue with Hydroxyzine 25 mg p.o. every 6 as needed anxiety; had to  receive a dose this morning and yesterday also given a dose of IV Lorazepam 1 mg given her severe anxiety and agitation -Started patient on Lidocaine patch 1 patch transdermally every 12 hours -Continue with Robaxin 500 mg p.o. every 8 as needed for muscle spasms -Continue with ondansetron for antiemetics added Phenergan for breakthrough nausea -Blood pressure was elevated in the setting withdrawn was 179/74  Low Back Pain -Acetaminophen has been ordered for pain.   -Given GI bleeding will not start ibuprofen and discontinue naproxen that was ordered with clonidine protocol.   -Other medications are addictive and would like to hold off on those for now. -Continue with Lidocaine patch -Continue with Clonidine withdrawal protocol as above -Recommend OOB to Chair  Depression -Continue Venlafaxine 150 mg po Daily   Elevated Troponin -Was initially normal but started trending upwards and is now 48 and has now trended down high-sensitivity; likely in the setting of polysubstance abuse -We will repeat x2 and trending down and  last one was 27 -EKG ordered and was a normal sinus rhythm at a rate of 65 and a QTC of 457 -Cardiology will be consulted at the request of Gastroenterology for further evaluation recommendations and they recommended no further work-up and follow-up.  Polysubstance Abuse -UDS was positive for opiates, cocaine, THC -Counseling given today again -Hepatitis Panel was negative  -We will need to monitor carefully  Leukocytosis -In the setting of hematemesis nausea vomiting from opiate withdrawal and was improved but slightly worsened again -Patient's WBC went from 19.8 -> 12.8 -> 9.6 -> 11.8 -Continue to monitor for signs and symptoms of infection -Repeat CBC in a.m.  Renal Insufficiency, improving with hydration -Patient's BUN/creatinine went from 16/0.97 -> 12/1.10 -> 20/1.04 -> 15/0.99 -Avoid nephrotoxic medications, contrast dyes, and hypotension -Continued with  normal saline rate of 100 and mils per hour +40 mEq of KCl but was now stopped. Willresume IVF today with NS at 75 mL/hr as she still remains nauseous -Continue monitor and trend Renal Function -Repeat CMP in a.m.  Hyperglycemia -Patient's blood sugar on admission was 147 and repeat this morning on CMP was 111; blood sugars ranging from 111-147 on BMP/CMP -Check HbA1c in AM  -Continue to Monitor and Trend Blood Glucose Carefully and if Necessary will place on Sensitive Novolog SSI AC  Bradycardia -She is currently asymptomatic and likely secondary to cocaine -Not on any beta blocking agents will need to continue monitor carefully -Heart rate has been ranging from 51-66 -Continue to monitor on Telemetry  DVT prophylaxis: SCDs Code Status: FULL CODE  Family Communication: No family present at bedside Disposition Plan: Pending further work-up and evaluation by Cardiology and EGD done by Gastroenterology  Consultants:   Gastroenterology   Cardiology   Procedures:  EGD 09/11/2018 Findings:      No gross lesions were noted in the proximal esophagus, in the mid       esophagus and in the distal esophagus.      A widely patent and non-obstructing Schatzki ring was found at the       gastroesophageal junction. Biopsies were taken with a cold forceps for       histology to disrupt this.      LA Grade B (one or more mucosal breaks greater than 5 mm, not extending       between the tops of two mucosal folds) esophagitis was found at the       gastroesophageal junction.      The Z-line was irregular and was found 41 cm from the incisors.      A small hiatal hernia was present.      Striped mildly erythematous mucosa without bleeding was found in the       gastric antrum.      A few dispersed, diminutive non-bleeding erosions were found in the       gastric antrum. There were no stigmata of recent bleeding.      No other gross lesions were noted in the entire examined stomach.        Biopsies were taken with a cold forceps for histology and Helicobacter       pylori testing.      No gross lesions were noted in the duodenal bulb, in the first portion       of the duodenum and in the second portion of the duodenum. Biopsies were       taken with a cold forceps for histology.      After  completion of EGD evaluation, a guidewire was placed and the scope       was withdrawn. Dilation was performed in the entire esophagus with a       Savary dilator with no resistance at 15 mm, mild resistance at 16 mm and       moderate resistance at 17 mm. The dilation site was examined following       endoscope reinsertion and showed mild mucosal disruption, mild       improvement in luminal narrowing and no perforation. Impression:               - Widely patent and non-obstructing Schatzki ring.                            Biopsied.                           - LA Grade B esophagitis distally.                           - Z-line irregular, 41 cm from the incisors.                           - Dilation performed in the entire esophagus.                           - Small hiatal hernia.                           - Erythematous mucosa in the antrum. Non-bleeding                            erosive gastropathy. Biopsied for HP.                           - No gross lesions in the duodenal bulb, in the                            first portion of the duodenum and in the second                            portion of the duodenum. Biopsied  Antimicrobials:  Anti-infectives (From admission, onward)   None     Subjective: Seen and examined at bedside and states that she is feeling extremely nauseous today and unable to tolerate anything p.o.  Was not feeling well and states the Zofran was ineffective in controlling her nausea.  States that she will never run or do drugs again.  No other concerns or complaints at this time.  Objective: Vitals:   09/11/18 1400 09/11/18 1449 09/11/18 2019 09/12/18 0533   BP: (!) 179/72 (!) 155/68 (!) 192/77 (!) 179/74  Pulse: 62 (!) 54 61 (!) 57  Resp: (!) 25 20 18 18   Temp:  98.4 F (36.9 C) 98.3 F (36.8 C) 98.4 F (36.9 C)  TempSrc:  Oral Oral Oral  SpO2: 99% 100% 99% 100%  Weight:      Height:        Intake/Output Summary (Last 24 hours) at 09/12/2018 1159 Last  data filed at 09/12/2018 0900 Gross per 24 hour  Intake 620 ml  Output --  Net 620 ml   Filed Weights   09/11/18 1243  Weight: 72.6 kg   Examination: Physical Exam:  Constitutional: Well-nourished, well-developed older than appearing stated age 32 female currently who is resting but not feeling well and still remains somewhat nauseous.  Still looks uncomfortable Eyes: Lids and conjunctive are normal.  Sclera anicteric ENMT: External ears nose appear normal.  Grossly normal hearing Neck: Appears supple no JVD Respiratory: Diminished auscultation bilaterally no patient wheezing, rales, rhonchi.  Patient not tachypneic wheezing any accessory muscles breathe. Cardiovascular: Slightly bradycardic rate but regular rhythm.  No appreciable murmurs, rubs, gallops. Abdomen: Soft, nontender, nondistended.  Bowel sounds present GU: Deferred Musculoskeletal: No contractures or cyanosis.  No joint deformities in the upper lower extremities Skin: Skin is warm and dry no appreciable rashes or lesions on limited skin evaluation Neurologic: Cranial nerves II through XII grossly intact no appreciable focal deficits Psychiatric: Has a normal judgment and insight.  She is awake, alert but is resting and remains somewhat anxious  Data Reviewed: I have personally reviewed following labs and imaging studies  CBC: Recent Labs  Lab 09/09/18 0822 09/09/18 1345 09/09/18 2002 09/10/18 0354 09/11/18 0320 09/12/18 0256  WBC 16.2* 19.8*  --  12.8* 9.6 11.8*  NEUTROABS 14.5*  --   --   --  6.2 9.3*  HGB 14.9 14.7 14.4 14.6 13.0 13.8  HCT 45.1 44.0 42.2 43.9 39.8 41.0  MCV 86.7 86.4  --  86.8  87.7 85.4  PLT 297 301  --  266 226 PLATELET CLUMPS NOTED ON SMEAR, UNABLE TO ESTIMATE   Basic Metabolic Panel: Recent Labs  Lab 09/09/18 0822 09/10/18 0354 09/11/18 0320 09/12/18 0256  NA 137 138 140 138  K 3.7 3.8 3.8 3.8  CL 99 104 110 104  CO2 23 23 24 23   GLUCOSE 147* 121* 116* 111*  BUN 16 12 20 15   CREATININE 0.97 1.10* 1.04* 0.99  CALCIUM 9.8 9.4 8.8* 8.9  MG  --  2.0 2.0 1.9  PHOS  --  3.0 2.7 3.5   GFR: Estimated Creatinine Clearance: 62.9 mL/min (by C-G formula based on SCr of 0.99 mg/dL). Liver Function Tests: Recent Labs  Lab 09/09/18 0822 09/10/18 0354 09/11/18 0320 09/12/18 0256  AST 17 31 19 25   ALT 17 17 18 24   ALKPHOS 85 74 54 63  BILITOT 0.8 0.7 0.6 0.9  PROT 7.8 7.4 6.0* 6.6  ALBUMIN 4.0 4.1 3.2* 3.6   Recent Labs  Lab 09/09/18 0822  LIPASE 37   No results for input(s): AMMONIA in the last 168 hours. Coagulation Profile: No results for input(s): INR, PROTIME in the last 168 hours. Cardiac Enzymes: No results for input(s): CKTOTAL, CKMB, CKMBINDEX, TROPONINI in the last 168 hours. BNP (last 3 results) No results for input(s): PROBNP in the last 8760 hours. HbA1C: No results for input(s): HGBA1C in the last 72 hours. CBG: No results for input(s): GLUCAP in the last 168 hours. Lipid Profile: No results for input(s): CHOL, HDL, LDLCALC, TRIG, CHOLHDL, LDLDIRECT in the last 72 hours. Thyroid Function Tests: No results for input(s): TSH, T4TOTAL, FREET4, T3FREE, THYROIDAB in the last 72 hours. Anemia Panel: No results for input(s): VITAMINB12, FOLATE, FERRITIN, TIBC, IRON, RETICCTPCT in the last 72 hours. Sepsis Labs: Recent Labs  Lab 09/09/18 0718  LATICACIDVEN 1.9    Recent Results (from the past 240 hour(s))  SARS Coronavirus 2 (  CEPHEID- Performed in [place] hospital lab), Hosp Order     Status: None   Collection Time: 09/09/18  8:22 AM   Specimen: Nasopharyngeal Swab  Result Value Ref Range Status   SARS Coronavirus 2  NEGATIVE NEGATIVE Final    Comment: (NOTE) If result is NEGATIVE SARS-CoV-2 target nucleic acids are NOT DETECTED. The SARS-CoV-2 RNA is generally detectable in upper and lower  respiratory specimens during the acute phase of infection. The lowest  concentration of SARS-CoV-2 viral copies this assay can detect is 250  copies / mL. A negative result does not preclude SARS-CoV-2 infection  and should not be used as the sole basis for treatment or other  patient management decisions.  A negative result may occur with  improper specimen collection / handling, submission of specimen other  than nasopharyngeal swab, presence of viral mutation(s) within the  areas targeted by this assay, and inadequate number of viral copies  (<250 copies / mL). A negative result must be combined with clinical  observations, patient history, and epidemiological information. If result is POSITIVE SARS-CoV-2 target nucleic acids are DETECTED. The SARS-CoV-2 RNA is generally detectable in upper and lower  respiratory specimens dur ing the acute phase of infection.  Positive  results are indicative of active infection with SARS-CoV-2.  Clinical  correlation with patient history and other diagnostic information is  necessary to determine patient infection status.  Positive results do  not rule out bacterial infection or co-infection with other viruses. If result is PRESUMPTIVE POSTIVE SARS-CoV-2 nucleic acids MAY BE PRESENT.   A presumptive positive result was obtained on the submitted specimen  and confirmed on repeat testing.  While 2019 novel coronavirus  (SARS-CoV-2) nucleic acids may be present in the submitted sample  additional confirmatory testing may be necessary for epidemiological  and / or clinical management purposes  to differentiate between  SARS-CoV-2 and other Sarbecovirus currently known to infect humans.  If clinically indicated additional testing with an alternate test  methodology [phone])  is advised. The SARS-CoV-2 RNA is generally  detectable in upper and lower respiratory sp ecimens during the acute  phase of infection. The expected result is Negative. Fact Sheet for Patients:  StrictlyIdeas.no Fact Sheet for Healthcare Providers: BankingDealers.co.za This test is not yet approved or cleared by the [place] FDA and has been authorized for detection and/or diagnosis of SARS-CoV-2 by FDA under an Emergency Use Authorization (EUA).  This EUA will remain in effect (meaning this test can be used) for the duration of the COVID-19 declaration under Section 564(b)(1) of the Act, 21 U.S.C. section 360bbb-3(b)(1), unless the authorization is terminated or revoked sooner. Performed at [facility], [place] [address], [place], [place] [address]    Radiology Studies: No results found. Scheduled Meds:  cloNIDine  0.1 mg Oral BH-qamhs   Followed by   Derrill Memo ON 09/14/2018] cloNIDine  0.1 mg Oral QAC breakfast   lidocaine  1 patch Transdermal Q24H   pantoprazole  40 mg Oral BID   pneumococcal 23 valent vaccine  0.5 mL Intramuscular Tomorrow-1000   sodium chloride flush  3 mL Intravenous Q12H   venlafaxine XR  150 mg Oral Q breakfast   Continuous Infusions:  ondansetron (ZOFRAN) IV 8 mg (09/12/18 0927)    LOS: 2 days   Kerney Elbe, DO [facility] PAGER is on AMION  If 7PM-7AM, please contact night-coverage [email or web address] Password [email or web address] 09/12/2018, 11:59 AM

## 2018-09-12 NOTE — Progress Notes (Signed)
BP running at 204/98. Notified MD on call. New orders received.

## 2018-09-12 NOTE — Progress Notes (Signed)
Pt constantly calling for nausea medication. Notified MD. New orders received.

## 2018-09-13 ENCOUNTER — Inpatient Hospital Stay (HOSPITAL_COMMUNITY): Payer: Medicare Other

## 2018-09-13 ENCOUNTER — Encounter (HOSPITAL_COMMUNITY): Payer: Self-pay | Admitting: Gastroenterology

## 2018-09-13 DIAGNOSIS — R52 Pain, unspecified: Secondary | ICD-10-CM

## 2018-09-13 LAB — URINALYSIS, ROUTINE W REFLEX MICROSCOPIC
Bilirubin Urine: NEGATIVE
Glucose, UA: NEGATIVE mg/dL
Ketones, ur: 5 mg/dL — AB
Leukocytes,Ua: NEGATIVE
Nitrite: NEGATIVE
Protein, ur: 30 mg/dL — AB
Specific Gravity, Urine: 1.011 (ref 1.005–1.030)
pH: 6 (ref 5.0–8.0)

## 2018-09-13 LAB — CBC WITH DIFFERENTIAL/PLATELET
Abs Immature Granulocytes: 0.04 10*3/uL (ref 0.00–0.07)
Basophils Absolute: 0 10*3/uL (ref 0.0–0.1)
Basophils Relative: 0 %
Eosinophils Absolute: 0 10*3/uL (ref 0.0–0.5)
Eosinophils Relative: 0 %
HCT: 43.3 % (ref 36.0–46.0)
Hemoglobin: 14.7 g/dL (ref 12.0–15.0)
Immature Granulocytes: 0 %
Lymphocytes Relative: 11 %
Lymphs Abs: 1.3 10*3/uL (ref 0.7–4.0)
MCH: 28.7 pg (ref 26.0–34.0)
MCHC: 33.9 g/dL (ref 30.0–36.0)
MCV: 84.6 fL (ref 80.0–100.0)
Monocytes Absolute: 0.6 10*3/uL (ref 0.1–1.0)
Monocytes Relative: 5 %
Neutro Abs: 9.3 10*3/uL — ABNORMAL HIGH (ref 1.7–7.7)
Neutrophils Relative %: 84 %
Platelets: 224 10*3/uL (ref 150–400)
RBC: 5.12 MIL/uL — ABNORMAL HIGH (ref 3.87–5.11)
RDW: 12.4 % (ref 11.5–15.5)
WBC: 11.3 10*3/uL — ABNORMAL HIGH (ref 4.0–10.5)
nRBC: 0 % (ref 0.0–0.2)

## 2018-09-13 LAB — PHOSPHORUS: Phosphorus: 3.1 mg/dL (ref 2.5–4.6)

## 2018-09-13 LAB — MAGNESIUM: Magnesium: 2 mg/dL (ref 1.7–2.4)

## 2018-09-13 LAB — COMPREHENSIVE METABOLIC PANEL
ALT: 41 U/L (ref 0–44)
AST: 30 U/L (ref 15–41)
Albumin: 4 g/dL (ref 3.5–5.0)
Alkaline Phosphatase: 70 U/L (ref 38–126)
Anion gap: 11 (ref 5–15)
BUN: 14 mg/dL (ref 8–23)
CO2: 23 mmol/L (ref 22–32)
Calcium: 9.2 mg/dL (ref 8.9–10.3)
Chloride: 105 mmol/L (ref 98–111)
Creatinine, Ser: 1.1 mg/dL — ABNORMAL HIGH (ref 0.44–1.00)
GFR calc Af Amer: >60 mL/min (ref >60)
GFR calc non Af Amer: 53 mL/min — ABNORMAL LOW (ref >60)
Glucose, Bld: 115 mg/dL — ABNORMAL HIGH (ref 70–99)
Potassium: 3.2 mmol/L — ABNORMAL LOW (ref 3.5–5.1)
Sodium: 139 mmol/L (ref 135–145)
Total Bilirubin: 0.9 mg/dL (ref 0.3–1.2)
Total Protein: 7.6 g/dL (ref 6.5–8.1)

## 2018-09-13 LAB — TSH: TSH: 1.704 u[IU]/mL (ref 0.350–4.500)

## 2018-09-13 LAB — HEMOGLOBIN A1C
Hgb A1c MFr Bld: 5.4 % (ref 4.8–5.6)
Mean Plasma Glucose: 108.28 mg/dL

## 2018-09-13 LAB — TROPONIN I (HIGH SENSITIVITY): Troponin I (High Sensitivity): 9 ng/L (ref <18)

## 2018-09-13 MED ORDER — SODIUM CHLORIDE 0.9 % IV SOLN
INTRAVENOUS | Status: AC
  Administered 2018-09-13 (×2): via INTRAVENOUS

## 2018-09-13 MED ORDER — PROMETHAZINE HCL 25 MG/ML IJ SOLN
12.5000 mg | Freq: Three times a day (TID) | INTRAMUSCULAR | Status: DC | PRN
  Administered 2018-09-14 – 2018-09-15 (×2): 12.5 mg via INTRAVENOUS
  Filled 2018-09-13 (×2): qty 1

## 2018-09-13 MED ORDER — HYDRALAZINE HCL 20 MG/ML IJ SOLN
10.0000 mg | Freq: Four times a day (QID) | INTRAMUSCULAR | Status: DC | PRN
  Administered 2018-09-13 – 2018-09-15 (×4): 10 mg via INTRAVENOUS
  Filled 2018-09-13 (×6): qty 1

## 2018-09-13 MED ORDER — HYDRALAZINE HCL 20 MG/ML IJ SOLN
10.0000 mg | Freq: Once | INTRAMUSCULAR | Status: AC
  Administered 2018-09-13: 08:00:00 10 mg via INTRAVENOUS
  Filled 2018-09-13: qty 1

## 2018-09-13 MED ORDER — SODIUM CHLORIDE 0.9 % IV SOLN
8.0000 mg | Freq: Three times a day (TID) | INTRAVENOUS | Status: DC
  Administered 2018-09-14 – 2018-09-18 (×14): 8 mg via INTRAVENOUS
  Filled 2018-09-13 (×16): qty 4

## 2018-09-13 MED ORDER — SCOPOLAMINE 1 MG/3DAYS TD PT72
1.0000 | MEDICATED_PATCH | TRANSDERMAL | Status: DC
  Administered 2018-09-13 – 2018-09-16 (×2): 1.5 mg via TRANSDERMAL
  Filled 2018-09-13 (×2): qty 1

## 2018-09-13 MED ORDER — POTASSIUM CHLORIDE CRYS ER 20 MEQ PO TBCR
40.0000 meq | EXTENDED_RELEASE_TABLET | Freq: Once | ORAL | Status: AC
  Administered 2018-09-13: 09:00:00 40 meq via ORAL
  Filled 2018-09-13: qty 2

## 2018-09-13 MED ORDER — LORAZEPAM 2 MG/ML IJ SOLN
1.0000 mg | Freq: Once | INTRAMUSCULAR | Status: AC
  Administered 2018-09-13: 1 mg via INTRAVENOUS
  Filled 2018-09-13: qty 1

## 2018-09-13 MED ORDER — POTASSIUM CHLORIDE 10 MEQ/100ML IV SOLN
10.0000 meq | INTRAVENOUS | Status: AC
  Administered 2018-09-13 (×4): 10 meq via INTRAVENOUS
  Filled 2018-09-13 (×4): qty 100

## 2018-09-13 MED ORDER — ALUM & MAG HYDROXIDE-SIMETH 200-200-20 MG/5ML PO SUSP
30.0000 mL | Freq: Once | ORAL | Status: AC
  Administered 2018-09-13: 30 mL via ORAL
  Filled 2018-09-13: qty 30

## 2018-09-13 NOTE — Progress Notes (Signed)
Gastroenterology Inpatient Follow-up Note   PATIENT IDENTIFICATION  Laurie [name] is a 63 y.o. female with a pmh significant for COPD/asthma, arthritis, GERD, IBS, fibromyalgia, colon polyps, PUD.  Patient admitted with likely withdrawal and episode of hematemesis and has persistent nausea/vomiting of unclear etiology. Hospital Day: 5  SUBJECTIVE  Patient remains hospitalized in the setting of significant nausea and vomiting.  Abdominal discomfort still present.  Unable to tolerate oral diet per patient and requesting service.  Recurrent episodes of hematemesis or coffee-ground emesis.   OBJECTIVE  Scheduled Inpatient Medications:   [START ON 09/14/2018] cloNIDine  0.1 mg Oral QAC breakfast   lidocaine  1 patch Transdermal Q24H   pantoprazole  40 mg Oral BID   pneumococcal 23 valent vaccine  0.5 mL Intramuscular Tomorrow-1000   sodium chloride flush  3 mL Intravenous Q12H   venlafaxine XR  150 mg Oral Q breakfast   Continuous Inpatient Infusions:   ondansetron (ZOFRAN) IV 8 mg (09/13/18 1823)   PRN Inpatient Medications: acetaminophen **OR** acetaminophen, dicyclomine, hydrALAZINE, hydrOXYzine, loperamide, methocarbamol, [DISCONTINUED] ondansetron **OR** ondansetron (ZOFRAN) IV, ondansetron, promethazine   Physical Examination  Temp:  [97.7 F (36.5 C)-98.9 F (37.2 C)] 98.3 F (36.8 C) (08/02 2040) Pulse Rate:  [64-86] 85 (08/02 2040) Resp:  [16-20] 18 (08/02 2040) BP: (158-209)/(71-99) 189/87 (08/02 2040) SpO2:  [93 %-99 %] 94 % (08/02 2040) Temp (24hrs), Avg:98.3 F (36.8 C), Min:97.7 F (36.5 C), Max:98.9 F (37.2 C)  Weight: 72.6 kg GEN: Chronically ill-appearing, shaking PSYCH: Cooperative EYE: Anicteric sclerae ENT: Dry mucous membranes without oral ulcers CV: Without rubs or gallops RESP: Decreased breath sounds at bases bilaterally GI: Present bowel sounds, soft, protuberant, tenderness to palpation generalized throughout the abdomen most especially  in the midepigastrium MSK/EXT: Bilateral lower extremity edema is present SKIN: No jaundice NEURO:  Alert & Oriented x 3, no focal deficits   Review of Data   Laboratory Studies   Recent Labs  Lab 09/11/18 0320 09/12/18 0256 09/13/18 0311  NA 140 138 139  K 3.8 3.8 3.2*  CL 110 104 105  CO2 24 23 23   BUN 20 15 14   CREATININE 1.04* 0.99 1.10*  GLUCOSE 116* 111* 115*  CALCIUM 8.8* 8.9 9.2  MG 2.0 1.9 2.0  PHOS 2.7 3.5 3.1   Recent Labs  Lab 09/13/18 0311  AST 30  ALT 41  ALKPHOS 70    Recent Labs  Lab 09/11/18 0320 09/12/18 0256 09/13/18 0311  WBC 9.6 11.8* 11.3*  HGB 13.0 13.8 14.7  HCT 39.8 41.0 43.3  PLT 226 PLATELET CLUMPS NOTED ON SMEAR, UNABLE TO ESTIMATE 224   No results for input(s): APTT, INR in the last 168 hours. Computed MELD-Na score unavailable. Necessary lab results were not found in the last year. Computed MELD score unavailable. Necessary lab results were not found in the last year.  Imaging Studies  Vas [place] Lower Extremity Venous (dvt)  Result Date: 09/13/2018  Lower Venous Study Indications: Sciatic pain.  Comparison Study: No prior study on file for comparison. Performing Technologist: Sharion Dove RVS  Examination Guidelines: A complete evaluation includes B-mode imaging, spectral Doppler, color Doppler, and power Doppler as needed of all accessible portions of each vessel. Bilateral testing is considered an integral part of a complete examination. Limited examinations for reoccurring indications may be performed as noted.  +---------+---------------+---------+-----------+----------+-------+  RIGHT     Compressibility Phasicity Spontaneity Properties Summary  +---------+---------------+---------+-----------+----------+-------+  CFV       Full  Yes       Yes                             +---------+---------------+---------+-----------+----------+-------+  SFJ       Full                                                       +---------+---------------+---------+-----------+----------+-------+  FV Prox   Full                                                      +---------+---------------+---------+-----------+----------+-------+  FV Mid    Full                                                      +---------+---------------+---------+-----------+----------+-------+  FV Distal Full                                                      +---------+---------------+---------+-----------+----------+-------+  PFV       Full                                                      +---------+---------------+---------+-----------+----------+-------+  POP       Full            Yes       Yes                             +---------+---------------+---------+-----------+----------+-------+  PTV       Full                                                      +---------+---------------+---------+-----------+----------+-------+  PERO      Full                                                      +---------+---------------+---------+-----------+----------+-------+   +----+---------------+---------+-----------+----------+-------+  LEFT Compressibility Phasicity Spontaneity Properties Summary  +----+---------------+---------+-----------+----------+-------+  CFV  Full            Yes       Yes                             +----+---------------+---------+-----------+----------+-------+     Summary: Right: There is no evidence of  deep vein thrombosis in the lower extremity. Left: No evidence of common femoral vein obstruction.  *See table(s) above for measurements and observations.    Preliminary     ASSESSMENT  Laurie [name] is a 63 y.o. female with a pmh significant for COPD/asthma, arthritis, GERD, IBS, fibromyalgia, colon polyps, PUD.  Patient admitted with likely withdrawal and episode of hematemesis and has persistent nausea/vomiting of unclear etiology.  Her nausea and vomiting persists.  Could be THC related although she is not currently using it at this  time.  May still be withdrawing continues to be monitored and treated as per primary medical service.  We will rule out other endocrine related issues with laboratories later today.  No role for repeating endoscopic evaluation at this time.  I will schedule her antiemetics.  If she continues to have issues will use PR suppositories.  She was should have around-the-clock antiemetics scheduled every 8 hours and PRN's to be used in a staggered fashion every 8 hours.  The role of a gastric emptying study in the inpatient setting is very low.  Her CT scan does not show evidence of an obstructive process so not sure an upper GI series as a much more fiber.   PLAN/RECOMMENDATIONS  Zofran every 8 hours Phenergan every 8 hours as needed Staggering each so that she has antiemetics every 4 hours present is the most important next step Rule out thyroid disease as well as hypoadrenalism Advance diet as tolerated Hold on upper GI series Treat what may be active withdrawal as per primary medical service   Please page/call with questions or concerns.   Justice Britain, MD [facility] Office # [phone]    LOS: 3 days  Irving Copas  09/13/2018, 10:02 PM

## 2018-09-13 NOTE — Progress Notes (Signed)
Pt's BP still running 194/90. Notified MD on call. Awaiting for orders.

## 2018-09-13 NOTE — Progress Notes (Signed)
VASCULAR LAB PRELIMINARY  PRELIMINARY  PRELIMINARY  PRELIMINARY  Right lower extremity venous completed.    Preliminary report:  See CV proc for preliminary results.   Zaire Vanbuskirk, RVT 09/13/2018, 2:03 PM

## 2018-09-13 NOTE — Progress Notes (Signed)
PROGRESS NOTE    CORTNEE [name]  WGN:[id number] DOB: [birth year] DOA: 09/09/2018 PCP: [name], MD   Brief Narrative:  HPI per Dr. Randa Spike on 09/09/2018 Laurie [name] is a 63 y.o. female with medical history significant of hypertension, hyperlipidemia, rheumatoid arthritis who presents the emergency department complaining of nausea vomiting abdominal pain.  Her symptoms started yesterday.  She has had no blood in her emesis or stools and denied recent travel or sick contacts she reports a nonproductive cough and she is a very reluctant historian.  She notes some sweats denies chills no known fevers temperature on arrival 100.3.  No known exposure to COVID-19.  She has diffuse abdominal pain and not able to answer if it makes anything better or worse.  She has had similar pain in the past but will not discuss it.  She was given Zofran by EMS and reports ongoing severe nausea with no recent vomiting.  She has had prior abdominal surgeries including a hysterectomy and appendectomy.  She is a daily smoker and also smokes marijuana.  In the ED white blood cell count was 16.2 and CMP was unremarkable.  COVID-19 was negative, chest x-ray shows COPD, after several hours of evaluation she tells the ED practitioner that she has been taking fentanyl which is not prescribed for her for her right lower back pain.  She states that the pain radiates down her right leg.  She has a known history of spinal disorders.  She has not been to her PCP for this and denies any falls or injuries resulting from the pain.  She ran out of fentanyl 2 days ago.  Think she may be withdrawing from the fentanyl.  Initial plan was to discharge the patient home with clonidine and Zofran with close follow-up with her PCP.  As patient was in the process of being discharged she began to vomit and her emesis was red to brown and positive for blood on Hemoccult.  Was then referred to me for further evaluation and management.   GI was also consulted.  They recommended PPI initiation and plan on endoscopy tomorrow to evaluate her esophagus and stomach and small intestine.  Patient is agreeable to the plan.  ED Course: As noted above.  Hemoglobin stable at 14 labs unremarkable but she is having occult positive emesis.  Also appears to be withdrawing.  Had difficulty keeping her clonidine down.  Required more Zofran.  **Interim History  Withdrawal protocol was started and patient placed on clonidine taper but she is still having some withdrawal symptoms continued to be nauseous.  EGD done and showed non-bleeding erosive gastropathy as well as esophagitis.  GI recommended placing the patient on PPI twice daily.  Patient continued to be to nauseous and Zofran was ineffective so started patient on Phenergan.  Diet was advanced to heart healthy diet however she is unable to tolerate this so we will start off on clears again.  I have asked GI to reevaluate given her continued nausea.  Patient continues to show withdrawal symptoms and was given one-time dose of lorazepam today for whole body shaking.  Assessment & Plan:   Principal Problem:   Hematemesis Active Problems:   Depression, recurrent (HCC)   LOW BACK PAIN   Reflux esophagitis   Polysubstance (excluding opioids) dependence, daily use (HCC)   AKI (acute kidney injury) ([place])   Opiate withdrawal (HCC)   Renal insufficiency   Elevated troponin   Leukocytosis  Hematemesis setting of esophagitis as  well as nonbleeding erosive gastropathy -Initially thought to be related to nausea and vomiting.   -Thought to have a Mallory-Weiss tear but needed to rule out peptic ulcer disease and esophageal gastric malignancies as well as to look for lesions and ectasias etc.   -She was started on Protonix 40 mg q12h we will continue and changed to p.o. twice daily -Placed on a heart healthy diet -C/w Supportive Care and C/w Antiemetics with ondansetron 8 mg IV every 6 PRN for nausea  and 4 mg p.o. every 6 as needed nausea and vomiting; -He was also given a dose of promethazine 12.5 mg once early this morning -FOBT was positive -GI evaluated and did an endoscopy on 09/11/2018 which showed a widely patent and nonobstructing Schatzki ring which was biopsied.  Patient also had LA grade B esophagitis distally and a Z line that was regular and 41 cm from the incisors.  Dilatation was performed in the entire esophagus and she also had a small hiatal hernia.  She is noted to have erythematous mucosa in the antrum with nonbleeding erosive gastropathy which was biopsied for H. pylori.  There is no gross lesions in the duodenal bulb and the first and second portion of the duodenum -GI recommending PPI twice daily for 2 months repeat an EGD in 3 months to check on healing as the patient has persistent symptoms c considering at that time of the EGD balloon dilatation versus repeat Savary and considering manometry -We will need to follow-up on pathology results and advance diet as tolerated  Nausea and Vomiting, persistent -Likely 2/2 to Opiate Withdrawal -IVF had been D/C'd but had to be resumed as her po Intake is not sufficient and that she is still having Nausea; continue with normal saline rate of 75 mL's per hour -Antiemetics as above -Added IV Phenergan 12.5 mcg q6hprn for Refractory Nausea and Vomiting -I have asked gastroenterology to reevaluate the patient given her continued persistent nausea  Opiate Withdrawal -Patient has been obtaining street fentanyl.   -UDS done on positive for opiates, cocaine, THC -She does not know how much she has been taking.  She is somewhat reluctant to give history.   -Started her on a Clonidine protocol and now on 0.1 mg in the a.m.and at bedtime for 4 doses and then 0.1 mg p.o. daily for breakfast for 2 days -Continued Bentyl 20 mg p.o. q. 6 as needed for spasms abdominal cramping -Continue with Hydroxyzine 25 mg p.o. every 6 as needed anxiety;   -Given a dose of IV Lorazepam 1 mg again today given her severe anxiety and agitation -Started patient on Lidocaine patch 1 patch transdermally every 12 hours -Continue with Robaxin 500 mg p.o. every 8 as needed for muscle spasms -Continue with ondansetron for antiemetics added Phenergan for breakthrough nausea -Blood pressure was elevated in the setting withdrawn was at 220/98 earlier this morning but now is now 158/83  Low Back Pain -Acetaminophen has been ordered for pain.   -Given GI bleeding will not start ibuprofen and discontinue naproxen that was ordered with clonidine protocol.   -Other medications are addictive and would like to hold off on those for now. -Continue with Lidocaine patch -Continue with Clonidine withdrawal protocol as above -Recommend OOB to Chair  Depression -Continue Venlafaxine 150 mg po Daily   Elevated Troponin -Was initially normal but started trending upwards and is now 48 and has now trended down high-sensitivity; likely in the setting of polysubstance abuse -We will repeat x2 and trending  down and last one was 27 -EKG ordered and was a normal sinus rhythm at a rate of 65 and a QTC of 457 -Cardiology will be consulted at the request of Gastroenterology for further evaluation recommendations and they recommended no further work-up and follow-up.  Polysubstance Abuse -UDS was positive for opiates, cocaine, THC -Counseling given today again -Hepatitis Panel was negative  -We will need to monitor carefully for withdrawals  Leukocytosis -In the setting of hematemesis nausea vomiting from opiate withdrawal and was improved but slightly worsened again -Patient's WBC went from 19.8 -> 12.8 -> 9.6 -> 11.8 -> 11.3 -Continue to monitor for signs and symptoms of infection -Repeat CBC in a.m.  Renal Insufficiency -Patient's BUN/creatinine went from 16/0.97 -> 12/1.10 -> 20/1.04 -> 15/0.99 -> 14/1.10 -Avoid nephrotoxic medications, contrast dyes, and  hypotension -Continued with normal saline rate of 100 and mils per hour +40 mEq of KCl but was now stopped. Will resume IVF again  with NS at 75 mL/hr as she still remains nauseous -Urinalysis done showed clear urine with yellow color, small hemoglobin, 30 protein, rare bacteria, 5 RBCs per high-power field, 0-5 squamous epithelial cells, 0-5 WBCs -Continue monitor and trend Renal Function -Repeat CMP in a.m.  Hyperglycemia -Patient's blood sugar on admission was 147 and repeat this morning on CMP was 111; blood sugars ranging from 111-147 on BMP/CMP -Check HbA1c and was 5.4 -Continue to Monitor and Trend Blood Glucose Carefully and if Necessary will place on Sensitive Novolog SSI AC  Bradycardia -She is currently asymptomatic and likely secondary to cocaine -Not on any beta blocking agents will need to continue monitor carefully -Heart rate has been ranging from 53-72 the last 24 hours and is now 83 -Continue to monitor on Telemetry  Right leg swelling and pain -Check venous duplex to rule out DVT this is pending official read -Mildly bigger than the left one -Continue to Monitor   DVT prophylaxis: SCDs Code Status: FULL CODE  Family Communication: No family present at bedside Disposition Plan: Pending further work-up and evaluation by Cardiology and EGD done by Gastroenterology  Consultants:   Gastroenterology   Cardiology   Procedures:  EGD 09/11/2018 Findings:      No gross lesions were noted in the proximal esophagus, in the mid       esophagus and in the distal esophagus.      A widely patent and non-obstructing Schatzki ring was found at the       gastroesophageal junction. Biopsies were taken with a cold forceps for       histology to disrupt this.      LA Grade B (one or more mucosal breaks greater than 5 mm, not extending       between the tops of two mucosal folds) esophagitis was found at the       gastroesophageal junction.      The Z-line was irregular and  was found 41 cm from the incisors.      A small hiatal hernia was present.      Striped mildly erythematous mucosa without bleeding was found in the       gastric antrum.      A few dispersed, diminutive non-bleeding erosions were found in the       gastric antrum. There were no stigmata of recent bleeding.      No other gross lesions were noted in the entire examined stomach.       Biopsies were taken with a cold forceps  for histology and Helicobacter       pylori testing.      No gross lesions were noted in the duodenal bulb, in the first portion       of the duodenum and in the second portion of the duodenum. Biopsies were       taken with a cold forceps for histology.      After completion of EGD evaluation, a guidewire was placed and the scope       was withdrawn. Dilation was performed in the entire esophagus with a       Savary dilator with no resistance at 15 mm, mild resistance at 16 mm and       moderate resistance at 17 mm. The dilation site was examined following       endoscope reinsertion and showed mild mucosal disruption, mild       improvement in luminal narrowing and no perforation. Impression:               - Widely patent and non-obstructing Schatzki ring.                            Biopsied.                           - LA Grade B esophagitis distally.                           - Z-line irregular, 41 cm from the incisors.                           - Dilation performed in the entire esophagus.                           - Small hiatal hernia.                           - Erythematous mucosa in the antrum. Non-bleeding                            erosive gastropathy. Biopsied for HP.                           - No gross lesions in the duodenal bulb, in the                            first portion of the duodenum and in the second                            portion of the duodenum. Biopsied  Antimicrobials:  Anti-infectives (From admission, onward)   None      Subjective: Seen and examined at bedside and earlier this morning she is having significant withdrawal symptoms and was shaking and given a dose of Lorazepam and was improved.  Continues to be persistently nauseous and not really able to tolerate food at all.  No chest pain, lightheadedness or dizziness.  Was a little confused today and tell me about her daughter.  No other concerns or complaints at this time.  Objective: Vitals:   09/13/18  4239 09/13/18 0859 09/13/18 1424 09/13/18 1600  BP: (!) 194/90 (!) 180/71 (!) 186/86 (!) 158/83  Pulse: 70 86 76 83  Resp: 17 20 18 17   Temp: 97.8 F (36.6 C) 97.7 F (36.5 C) 98.4 F (36.9 C) 98.6 F (37 C)  TempSrc: Oral Oral Oral Oral  SpO2: 99% 98% 98% 97%  Weight:      Height:        Intake/Output Summary (Last 24 hours) at 09/13/2018 1632 Last data filed at 09/13/2018 1500 Gross per 24 hour  Intake 1653.36 ml  Output --  Net 1653.36 ml   Filed Weights   09/11/18 1243  Weight: 72.6 kg   Examination: Physical Exam:  Constitutional: Well-nourished, well-developed overweight man appearing stated age 25 female who is slightly anxious and remains currently unwell and fatigued and does look uncomfortable again Eyes: Lids and conjunctive are normal.  Sclera neck ENMT: External ears nose appear normal.  Grossly normal hearing Neck: Appears supple no JVD Respiratory: Slight diminished auscultation bilaterally no appreciable wheezing, risk rhonchi.  Patient not tachypneic or using any accessory muscles to breathe Cardiovascular: Regular rate rhythm.  No appreciable murmurs, rubs, gallops Abdomen: Soft, non-trichomonacide.  Bowel sounds present GU: Deferred Musculoskeletal: No contractures or cyanosis.  No joint deformities in the upper lower extremities Skin: Skin is warm dry no appreciable rashes or lesions on to skin evaluation Neurologic: Cranial nerves II through XII gross intact no appreciable focal deficits Psychiatric: Appears  anxious and is resting.  Did appear slightly confused today  Data Reviewed: I have personally reviewed following labs and imaging studies  CBC: Recent Labs  Lab 09/09/18 0822 09/09/18 1345 09/09/18 2002 09/10/18 0354 09/11/18 0320 09/12/18 0256 09/13/18 0311  WBC 16.2* 19.8*  --  12.8* 9.6 11.8* 11.3*  NEUTROABS 14.5*  --   --   --  6.2 9.3* 9.3*  HGB 14.9 14.7 14.4 14.6 13.0 13.8 14.7  HCT 45.1 44.0 42.2 43.9 39.8 41.0 43.3  MCV 86.7 86.4  --  86.8 87.7 85.4 84.6  PLT 297 301  --  266 226 PLATELET CLUMPS NOTED ON SMEAR, UNABLE TO ESTIMATE 532   Basic Metabolic Panel: Recent Labs  Lab 09/09/18 0822 09/10/18 0354 09/11/18 0320 09/12/18 0256 09/13/18 0311  NA 137 138 140 138 139  K 3.7 3.8 3.8 3.8 3.2*  CL 99 104 110 104 105  CO2 23 23 24 23 23   GLUCOSE 147* 121* 116* 111* 115*  BUN 16 12 20 15 14   CREATININE 0.97 1.10* 1.04* 0.99 1.10*  CALCIUM 9.8 9.4 8.8* 8.9 9.2  MG  --  2.0 2.0 1.9 2.0  PHOS  --  3.0 2.7 3.5 3.1   GFR: Estimated Creatinine Clearance: 56.6 mL/min (A) (by C-G formula based on SCr of 1.1 mg/dL (H)). Liver Function Tests: Recent Labs  Lab 09/09/18 0233 09/10/18 0354 09/11/18 0320 09/12/18 0256 09/13/18 0311  AST 17 31 19 25 30   ALT 17 17 18 24  41  ALKPHOS 85 74 54 63 70  BILITOT 0.8 0.7 0.6 0.9 0.9  PROT 7.8 7.4 6.0* 6.6 7.6  ALBUMIN 4.0 4.1 3.2* 3.6 4.0   Recent Labs  Lab 09/09/18 0822  LIPASE 37   No results for input(s): AMMONIA in the last 168 hours. Coagulation Profile: No results for input(s): INR, PROTIME in the last 168 hours. Cardiac Enzymes: No results for input(s): CKTOTAL, CKMB, CKMBINDEX, TROPONINI in the last 168 hours. BNP (last 3 results) No results for input(s): PROBNP in  the last 8760 hours. HbA1C: Recent Labs    09/13/18 0311  HGBA1C 5.4   CBG: No results for input(s): GLUCAP in the last 168 hours. Lipid Profile: No results for input(s): CHOL, HDL, LDLCALC, TRIG, CHOLHDL, LDLDIRECT in the last 72  hours. Thyroid Function Tests: Recent Labs    09/13/18 1054  TSH 1.704   Anemia Panel: No results for input(s): VITAMINB12, FOLATE, FERRITIN, TIBC, IRON, RETICCTPCT in the last 72 hours. Sepsis Labs: Recent Labs  Lab 09/09/18 0718  LATICACIDVEN 1.9    Recent Results (from the past 240 hour(s))  SARS Coronavirus 2 (CEPHEID- Performed in [facility] hospital lab), Hosp Order     Status: None   Collection Time: 09/09/18  8:22 AM   Specimen: Nasopharyngeal Swab  Result Value Ref Range Status   SARS Coronavirus 2 NEGATIVE NEGATIVE Final    Comment: (NOTE) If result is NEGATIVE SARS-CoV-2 target nucleic acids are NOT DETECTED. The SARS-CoV-2 RNA is generally detectable in upper and lower  respiratory specimens during the acute phase of infection. The lowest  concentration of SARS-CoV-2 viral copies this assay can detect is 250  copies / mL. A negative result does not preclude SARS-CoV-2 infection  and should not be used as the sole basis for treatment or other  patient management decisions.  A negative result may occur with  improper specimen collection / handling, submission of specimen other  than nasopharyngeal swab, presence of viral mutation(s) within the  areas targeted by this assay, and inadequate number of viral copies  (<250 copies / mL). A negative result must be combined with clinical  observations, patient history, and epidemiological information. If result is POSITIVE SARS-CoV-2 target nucleic acids are DETECTED. The SARS-CoV-2 RNA is generally detectable in upper and lower  respiratory specimens dur ing the acute phase of infection.  Positive  results are indicative of active infection with SARS-CoV-2.  Clinical  correlation with patient history and other diagnostic information is  necessary to determine patient infection status.  Positive results do  not rule out bacterial infection or co-infection with other viruses. If result is PRESUMPTIVE POSTIVE SARS-CoV-2  nucleic acids MAY BE PRESENT.   A presumptive positive result was obtained on the submitted specimen  and confirmed on repeat testing.  While 2019 novel coronavirus  (SARS-CoV-2) nucleic acids may be present in the submitted sample  additional confirmatory testing may be necessary for epidemiological  and / or clinical management purposes  to differentiate between  SARS-CoV-2 and other Sarbecovirus currently known to infect humans.  If clinically indicated additional testing with an alternate test  methodology [id number]) is advised. The SARS-CoV-2 RNA is generally  detectable in upper and lower respiratory sp ecimens during the acute  phase of infection. The expected result is Negative. Fact Sheet for Patients:  StrictlyIdeas.no Fact Sheet for Healthcare Providers: BankingDealers.co.za This test is not yet approved or cleared by the [place] FDA and has been authorized for detection and/or diagnosis of SARS-CoV-2 by FDA under an Emergency Use Authorization (EUA).  This EUA will remain in effect (meaning this test can be used) for the duration of the COVID-19 declaration under Section 564(b)(1) of the Act, 21 U.S.C. section 360bbb-3(b)(1), unless the authorization is terminated or revoked sooner. Performed at [facility], [place] [address], [place], [place] [address]    Radiology Studies: Vas [place] Lower Extremity Venous (dvt)  Result Date: 09/13/2018  Lower Venous Study Indications: Sciatic pain.  Comparison Study: No prior study on file for  comparison. Performing Technologist: Sharion Dove RVS  Examination Guidelines: A complete evaluation includes B-mode imaging, spectral Doppler, color Doppler, and power Doppler as needed of all accessible portions of each vessel. Bilateral testing is considered an integral part of a complete examination. Limited examinations for reoccurring indications may be performed as noted.   +---------+---------------+---------+-----------+----------+-------+  RIGHT     Compressibility Phasicity Spontaneity Properties Summary  +---------+---------------+---------+-----------+----------+-------+  CFV       Full            Yes       Yes                             +---------+---------------+---------+-----------+----------+-------+  SFJ       Full                                                      +---------+---------------+---------+-----------+----------+-------+  FV Prox   Full                                                      +---------+---------------+---------+-----------+----------+-------+  FV Mid    Full                                                      +---------+---------------+---------+-----------+----------+-------+  FV Distal Full                                                      +---------+---------------+---------+-----------+----------+-------+  PFV       Full                                                      +---------+---------------+---------+-----------+----------+-------+  POP       Full            Yes       Yes                             +---------+---------------+---------+-----------+----------+-------+  PTV       Full                                                      +---------+---------------+---------+-----------+----------+-------+  PERO      Full                                                      +---------+---------------+---------+-----------+----------+-------+   +----+---------------+---------+-----------+----------+-------+  LEFT Compressibility Phasicity Spontaneity Properties Summary  +----+---------------+---------+-----------+----------+-------+  CFV  Full            Yes       Yes                             +----+---------------+---------+-----------+----------+-------+     Summary: Right: There is no evidence of deep vein thrombosis in the lower extremity. Left: No evidence of common femoral vein obstruction.  *See table(s) above for  measurements and observations.    Preliminary    Scheduled Meds:  cloNIDine  0.1 mg Oral BH-qamhs   Followed by   Derrill Memo ON 09/14/2018] cloNIDine  0.1 mg Oral QAC breakfast   lidocaine  1 patch Transdermal Q24H   pantoprazole  40 mg Oral BID   pneumococcal 23 valent vaccine  0.5 mL Intramuscular Tomorrow-1000   sodium chloride flush  3 mL Intravenous Q12H   venlafaxine XR  150 mg Oral Q breakfast   Continuous Infusions:  sodium chloride 75 mL/hr at 09/13/18 1500   ondansetron (ZOFRAN) IV 8 mg (09/13/18 0354)   potassium chloride 10 mEq (09/13/18 1606)    LOS: 3 days   Kerney Elbe, DO [facility] PAGER is on [place]  If 7PM-7AM, please contact night-coverage [email or web address] Password [email or web address] 09/13/2018, 4:32 PM

## 2018-09-14 DIAGNOSIS — K210 Gastro-esophageal reflux disease with esophagitis: Secondary | ICD-10-CM

## 2018-09-14 LAB — COMPREHENSIVE METABOLIC PANEL
ALT: 56 U/L — ABNORMAL HIGH (ref 0–44)
AST: 41 U/L (ref 15–41)
Albumin: 3.8 g/dL (ref 3.5–5.0)
Alkaline Phosphatase: 72 U/L (ref 38–126)
Anion gap: 11 (ref 5–15)
BUN: 14 mg/dL (ref 8–23)
CO2: 21 mmol/L — ABNORMAL LOW (ref 22–32)
Calcium: 9.1 mg/dL (ref 8.9–10.3)
Chloride: 105 mmol/L (ref 98–111)
Creatinine, Ser: 1.03 mg/dL — ABNORMAL HIGH (ref 0.44–1.00)
GFR calc Af Amer: >60 mL/min (ref >60)
GFR calc non Af Amer: 58 mL/min — ABNORMAL LOW (ref >60)
Glucose, Bld: 130 mg/dL — ABNORMAL HIGH (ref 70–99)
Potassium: 3.4 mmol/L — ABNORMAL LOW (ref 3.5–5.1)
Sodium: 137 mmol/L (ref 135–145)
Total Bilirubin: 1.2 mg/dL (ref 0.3–1.2)
Total Protein: 6.9 g/dL (ref 6.5–8.1)

## 2018-09-14 LAB — CBC WITH DIFFERENTIAL/PLATELET
Abs Immature Granulocytes: 0.04 10*3/uL (ref 0.00–0.07)
Basophils Absolute: 0 10*3/uL (ref 0.0–0.1)
Basophils Relative: 0 %
Eosinophils Absolute: 0.1 10*3/uL (ref 0.0–0.5)
Eosinophils Relative: 1 %
HCT: 42.7 % (ref 36.0–46.0)
Hemoglobin: 14.5 g/dL (ref 12.0–15.0)
Immature Granulocytes: 0 %
Lymphocytes Relative: 16 %
Lymphs Abs: 1.7 10*3/uL (ref 0.7–4.0)
MCH: 28.8 pg (ref 26.0–34.0)
MCHC: 34 g/dL (ref 30.0–36.0)
MCV: 84.7 fL (ref 80.0–100.0)
Monocytes Absolute: 0.7 10*3/uL (ref 0.1–1.0)
Monocytes Relative: 6 %
Neutro Abs: 8.2 10*3/uL — ABNORMAL HIGH (ref 1.7–7.7)
Neutrophils Relative %: 77 %
Platelets: 232 10*3/uL (ref 150–400)
RBC: 5.04 MIL/uL (ref 3.87–5.11)
RDW: 12.7 % (ref 11.5–15.5)
WBC: 10.7 10*3/uL — ABNORMAL HIGH (ref 4.0–10.5)
nRBC: 0 % (ref 0.0–0.2)

## 2018-09-14 LAB — PROTIME-INR
INR: 1.1 (ref 0.8–1.2)
Prothrombin Time: 14.1 seconds (ref 11.4–15.2)

## 2018-09-14 LAB — PHOSPHORUS: Phosphorus: 3.4 mg/dL (ref 2.5–4.6)

## 2018-09-14 LAB — MAGNESIUM: Magnesium: 2 mg/dL (ref 1.7–2.4)

## 2018-09-14 LAB — CORTISOL: Cortisol, Plasma: 15.3 ug/dL

## 2018-09-14 MED ORDER — TRAZODONE HCL 50 MG PO TABS
50.0000 mg | ORAL_TABLET | Freq: Once | ORAL | Status: AC
  Administered 2018-09-15: 50 mg via ORAL
  Filled 2018-09-14: qty 1

## 2018-09-14 MED ORDER — HYDROXYZINE HCL 25 MG PO TABS
25.0000 mg | ORAL_TABLET | Freq: Four times a day (QID) | ORAL | Status: AC | PRN
  Administered 2018-09-15 (×3): 25 mg via ORAL
  Filled 2018-09-14 (×4): qty 1

## 2018-09-14 MED ORDER — LOPERAMIDE HCL 2 MG PO CAPS: 2.0000 mg | ORAL_CAPSULE | ORAL | Status: DC | PRN

## 2018-09-14 MED ORDER — VITAMIN B-1 100 MG PO TABS
100.0000 mg | ORAL_TABLET | Freq: Every day | ORAL | Status: DC
  Administered 2018-09-15 – 2018-09-18 (×4): 100 mg via ORAL
  Filled 2018-09-14 (×5): qty 1

## 2018-09-14 MED ORDER — ADULT MULTIVITAMIN W/MINERALS CH
1.0000 | ORAL_TABLET | Freq: Every day | ORAL | Status: DC
  Administered 2018-09-14 – 2018-09-18 (×4): 1 via ORAL
  Filled 2018-09-14 (×5): qty 1

## 2018-09-14 MED ORDER — CHLORDIAZEPOXIDE HCL 25 MG PO CAPS
25.0000 mg | ORAL_CAPSULE | Freq: Every day | ORAL | Status: AC
  Administered 2018-09-17: 25 mg via ORAL
  Filled 2018-09-14: qty 1

## 2018-09-14 MED ORDER — CHLORDIAZEPOXIDE HCL 25 MG PO CAPS: 25.0000 mg | ORAL_CAPSULE | Freq: Four times a day (QID) | ORAL | Status: AC | PRN

## 2018-09-14 MED ORDER — KCL IN DEXTROSE-NACL 40-5-0.45 MEQ/L-%-% IV SOLN
INTRAVENOUS | Status: DC
  Administered 2018-09-14 (×2): via INTRAVENOUS
  Filled 2018-09-14 (×3): qty 1000

## 2018-09-14 MED ORDER — TRAZODONE HCL 50 MG PO TABS
50.0000 mg | ORAL_TABLET | Freq: Once | ORAL | Status: AC
  Administered 2018-09-14: 50 mg via ORAL
  Filled 2018-09-14: qty 1

## 2018-09-14 MED ORDER — POTASSIUM CHLORIDE CRYS ER 20 MEQ PO TBCR
40.0000 meq | EXTENDED_RELEASE_TABLET | Freq: Once | ORAL | Status: AC
  Administered 2018-09-14: 12:00:00 40 meq via ORAL
  Filled 2018-09-14: qty 2

## 2018-09-14 MED ORDER — CHLORDIAZEPOXIDE HCL 25 MG PO CAPS
25.0000 mg | ORAL_CAPSULE | ORAL | Status: AC
  Administered 2018-09-16 (×2): 25 mg via ORAL
  Filled 2018-09-14 (×2): qty 1

## 2018-09-14 MED ORDER — POTASSIUM CHLORIDE 10 MEQ/100ML IV SOLN
10.0000 meq | INTRAVENOUS | Status: DC
  Administered 2018-09-14: 10 meq via INTRAVENOUS
  Filled 2018-09-14: qty 100

## 2018-09-14 MED ORDER — CHLORDIAZEPOXIDE HCL 25 MG PO CAPS
25.0000 mg | ORAL_CAPSULE | Freq: Four times a day (QID) | ORAL | Status: AC
  Administered 2018-09-14 (×4): 25 mg via ORAL
  Filled 2018-09-14 (×4): qty 1

## 2018-09-14 MED ORDER — ONDANSETRON 4 MG PO TBDP
4.0000 mg | ORAL_TABLET | Freq: Four times a day (QID) | ORAL | Status: AC | PRN
  Administered 2018-09-15 – 2018-09-16 (×4): 4 mg via ORAL
  Filled 2018-09-14 (×5): qty 1

## 2018-09-14 MED ORDER — METOCLOPRAMIDE HCL 5 MG/ML IJ SOLN
10.0000 mg | Freq: Once | INTRAMUSCULAR | Status: AC
  Administered 2018-09-14: 10 mg via INTRAVENOUS
  Filled 2018-09-14: qty 2

## 2018-09-14 MED ORDER — CHLORDIAZEPOXIDE HCL 25 MG PO CAPS
25.0000 mg | ORAL_CAPSULE | Freq: Three times a day (TID) | ORAL | Status: AC
  Administered 2018-09-15 (×3): 25 mg via ORAL
  Filled 2018-09-14 (×3): qty 1

## 2018-09-14 MED ORDER — THIAMINE HCL 100 MG/ML IJ SOLN
100.0000 mg | Freq: Once | INTRAMUSCULAR | Status: AC
  Administered 2018-09-14: 100 mg via INTRAMUSCULAR
  Filled 2018-09-14 (×2): qty 2

## 2018-09-14 NOTE — Anesthesia Postprocedure Evaluation (Signed)
Anesthesia Post Note  Patient: [name] D Shober  Procedure(s) Performed: ESOPHAGOGASTRODUODENOSCOPY (EGD) WITH PROPOFOL (N/A ) BIOPSY SAVORY DILATION (N/A )     Patient location during evaluation: PACU Anesthesia Type: MAC Level of consciousness: awake and alert Pain management: pain level controlled Vital Signs Assessment: post-procedure vital signs reviewed and stable Respiratory status: spontaneous breathing, nonlabored ventilation, respiratory function stable and patient connected to nasal cannula oxygen Cardiovascular status: stable and blood pressure returned to baseline Postop Assessment: no apparent nausea or vomiting Anesthetic complications: no    Last Vitals:  Vitals:   09/14/18 0806 09/14/18 1304  BP: (!) 158/58 (!) 162/75  Pulse: 91 78  Resp:  18  Temp:  37.1 C  SpO2:  94%    Last Pain:  Vitals:   09/14/18 1304  TempSrc: Oral  PainSc:                  Ziasia Lenoir S

## 2018-09-14 NOTE — Progress Notes (Signed)
PROGRESS NOTE    Laurie [name]  ZOX:[id number] DOB: 1955-05-08 DOA: 09/09/2018 PCP: [name], MD   Brief Narrative:  HPI per Dr. Randa Spike on 09/09/2018 Laurie [name] is a 63 y.o. female with medical history significant of hypertension, hyperlipidemia, rheumatoid arthritis who presents the emergency department complaining of nausea vomiting abdominal pain.  Her symptoms started yesterday.  She has had no blood in her emesis or stools and denied recent travel or sick contacts she reports a nonproductive cough and she is a very reluctant historian.  She notes some sweats denies chills no known fevers temperature on arrival 100.3.  No known exposure to COVID-19.  She has diffuse abdominal pain and not able to answer if it makes anything better or worse.  She has had similar pain in the past but will not discuss it.  She was given Zofran by EMS and reports ongoing severe nausea with no recent vomiting.  She has had prior abdominal surgeries including a hysterectomy and appendectomy.  She is a daily smoker and also smokes marijuana.  In the ED white blood cell count was 16.2 and CMP was unremarkable.  COVID-19 was negative, chest x-ray shows COPD, after several hours of evaluation she tells the ED practitioner that she has been taking fentanyl which is not prescribed for her for her right lower back pain.  She states that the pain radiates down her right leg.  She has a known history of spinal disorders.  She has not been to her PCP for this and denies any falls or injuries resulting from the pain.  She ran out of fentanyl 2 days ago.  Think she may be withdrawing from the fentanyl.  Initial plan was to discharge the patient home with clonidine and Zofran with close follow-up with her PCP.  As patient was in the process of being discharged she began to vomit and her emesis was red to brown and positive for blood on Hemoccult.  Was then referred to me for further evaluation and management.   GI was also consulted.  They recommended PPI initiation and plan on endoscopy tomorrow to evaluate her esophagus and stomach and small intestine.  Patient is agreeable to the plan.  ED Course: As noted above.  Hemoglobin stable at 14 labs unremarkable but she is having occult positive emesis.  Also appears to be withdrawing.  Had difficulty keeping her clonidine down.  Required more Zofran.  **Interim History  Withdrawal protocol was started and patient placed on clonidine taper but she is still having some withdrawal symptoms continued to be nauseous.  EGD done and showed non-bleeding erosive gastropathy as well as esophagitis.  GI recommended placing the patient on PPI twice daily.  Patient continued to be to nauseous and Zofran was ineffective so started patient on Phenergan.  Diet was advanced to heart healthy diet however she is unable to tolerate this so we will start off on clears again.  I have asked GI to reevaluate given her continued nausea.  Patient continues to show withdrawal symptoms and was given one-time dose of lorazepam he will be started on a Librium taper because of lack of improvement.  Assessment & Plan:   Principal Problem:   Hematemesis Active Problems:   Depression, recurrent (HCC)   LOW BACK PAIN   Reflux esophagitis   Polysubstance (excluding opioids) dependence, daily use (HCC)   AKI (acute kidney injury) ([place])   Opiate withdrawal (HCC)   Renal insufficiency   Elevated troponin  Leukocytosis  Hematemesis setting of esophagitis as well as nonbleeding erosive gastropathy; hematemesis improved but still continues to have nausea and vomiting -Initially thought to be related to nausea and vomiting.   -Thought to have a Mallory-Weiss tear but needed to rule out peptic ulcer disease and esophageal gastric malignancies as well as to look for lesions and ectasias etc.   -She was started on Protonix 40 mg q12h we will continue and changed to p.o. twice daily and will  continue for now -Placed on a heart healthy diet patient is not tolerating -FOBT was positive -GI evaluated and did an endoscopy on 09/11/2018 which showed a widely patent and nonobstructing Schatzki ring which was biopsied.  Patient also had LA grade B esophagitis distally and a Z line that was regular and 41 cm from the incisors.  Dilatation was performed in the entire esophagus and she also had a small hiatal hernia.  She is noted to have erythematous mucosa in the antrum with nonbleeding erosive gastropathy which was biopsied for H. pylori.  There is no gross lesions in the duodenal bulb and the first and second portion of the duodenum -GI recommending PPI twice daily for 2 months repeat an EGD in 3 months to check on healing as the patient has persistent symptoms c considering at that time of the EGD balloon dilatation versus repeat Savary and considering manometry -GI is started scheduled Zofran, as needed IV Phenergan, as needed IV Zofran as well as Protonix p.o. twice daily; see below -We will need to follow-up on pathology results and advance diet as tolerated  Nausea and Vomiting, persistent -Likely 2/2 to Opiate Withdrawal -Resumed IV fluids and D5 half-normal saline +40 mEq of KCl -Antiemetics as above -Added IV Phenergan 12.5 mcg q6hprn for Refractory Nausea and Vomiting -I have asked gastroenterology to reevaluate the patient given her continued persistent nausea and they have adjusted her medications.  They have started scheduled Zofran, as needed IV Phenergan, as needed IV Zofran as well as.  Tonics 40 mg p.o. twice daily and started a scopolamine patch as well -U/A negative for Infection  -AM Cortisol was 15.3 -Continue IV fluids and continue supportive care -She is also given IV metoclopramide 10 mg last night  Opiate Withdrawal -Patient has been obtaining street fentanyl.   -UDS done on positive for opiates, cocaine, THC -She does not know how much she has been taking.  She  is somewhat reluctant to give history.    Question if she also had alcohol withdrawal to -Started her on a Clonidine protocol and now on 0.1 mg p.o. daily for breakfast for 2 days -Because of lack of improvement I have also started a Librium taper in case the patient has had a history of alcohol use as well -Continued Bentyl 20 mg p.o. q. 6 as needed for spasms abdominal cramping -Continue with Hydroxyzine 25 mg p.o. every 6 as needed anxiety;  -Given a dose of IV Lorazepam 1 mg again yesterday given her severe anxiety and agitation -Started patient on Lidocaine patch 1 patch transdermally every 12 hours -Continue with Robaxin 500 mg p.o. every 8 as needed for muscle spasms -Continue with IV Zofran 8 mg every 8 hours scheduled, with Zofran 4 mg p.o. every 6 as needed for nausea vomiting along with PPI twice daily as well as a scopolamine patch 1.5 mg transdermal every 72 hours and 12.5 mg of Phenergan every 8 hours for refractory nausea staggered between the scheduled Zofran -Blood pressure was elevated in  the setting withdrawn was at 220/98 earlier this morning but now is now 162/75  Low Back Pain -Acetaminophen has been ordered for pain.   -Given GI bleeding will not start ibuprofen and discontinue naproxen that was ordered with clonidine protocol.   -Other medications are addictive and would like to hold off on those for now. -Continue with Lidocaine patch -Continue with Clonidine withdrawal protocol as above -Recommend OOB to Chair  Depression -Continue Venlafaxine 150 mg po Daily   Elevated Troponin -Was initially normal but started trending upwards and is now 48 and has now trended down high-sensitivity; likely in the setting of polysubstance abuse -We will repeat x2 and trending down and last one was 27 -EKG ordered and was a normal sinus rhythm at a rate of 65 and a QTC of 457 -Cardiology will be consulted at the request of Gastroenterology for further evaluation recommendations  and they recommended no further work-up and follow-up.  Polysubstance Abuse -UDS was positive for opiates, cocaine, THC -Counseling given today again -Hepatitis Panel was negative  -We will need to monitor carefully for withdrawals and continue with the clonidine taper and have added a Librium taper as well now -Social work to provide substance use counseling resources  Leukocytosis -In the setting of hematemesis nausea vomiting from opiate withdrawal and was improved but slightly worsened again -Patient's WBC went from 19.8 -> 12.8 -> 9.6 -> 11.8 -> 11.3 -> 10.7 -Continue to monitor for signs and symptoms of infection -Repeat CBC in a.m.  Renal Insufficiency -Patient's BUN/creatinine went from 16/0.97 -> 12/1.10 -> 20/1.04 -> 15/0.99 -> 14/1.10 -> 14/1.03 -Avoid nephrotoxic medications, contrast dyes, and hypotension -Continued with normal saline rate of 100 and mils per hour +40 mEq of KCl but was now stopped. Will resume IVF again  with NS at 75 mL/hr as she still remains nauseous -Urinalysis done showed clear urine with yellow color, small hemoglobin, 30 protein, rare bacteria, 5 RBCs per high-power field, 0-5 squamous epithelial cells, 0-5 WBCs -Continue monitor and trend Renal Function -Repeat CMP in a.m.  Hyperglycemia -Patient's blood sugar on admission was 147 and repeat this morning on CMP was 111; blood sugars ranging from 111-147 on BMP/CMP -Check HbA1c and was 5.4 -Continue to Monitor and Trend Blood Glucose Carefully and if Necessary will place on Sensitive Novolog SSI AC  Bradycardia -She is currently asymptomatic and likely secondary to cocaine -TSH was 1.70 -Not on any beta blocking agents will need to continue monitor carefully -Heart rate has been ranging from 53-72 the last 24 hours and is now 49 -Continue to monitor on Telemetry  Right leg swelling and pain -Check venous duplex to rule out DVT and showed: Right: There is no evidence of deep vein thrombosis in  the lower extremity. Left: No evidence of common femoral vein obstruction. -Mildly bigger than the left one -Continue to Monitor   Hypokalemia -Patient potassium this morning was 3.4 -Replete with IV KCl fluids and p.o. KCl as she could not tolerate the other IV 40 mEq -Continue monitor and replete as necessary -Repeat CMP in the a.m.  Elevated ALT -Mild -Continue to Monitor and Trend -Repeat CMP in AM   DVT prophylaxis: SCDs Code Status: FULL CODE  Family Communication: No family present at bedside Disposition Plan: Pending further work-up and evaluation by Cardiology and EGD done by Gastroenterology  Consultants:   Gastroenterology   Cardiology   Procedures:  EGD 09/11/2018 Findings:      No gross lesions were noted in the  proximal esophagus, in the mid       esophagus and in the distal esophagus.      A widely patent and non-obstructing Schatzki ring was found at the       gastroesophageal junction. Biopsies were taken with a cold forceps for       histology to disrupt this.      LA Grade B (one or more mucosal breaks greater than 5 mm, not extending       between the tops of two mucosal folds) esophagitis was found at the       gastroesophageal junction.      The Z-line was irregular and was found 41 cm from the incisors.      A small hiatal hernia was present.      Striped mildly erythematous mucosa without bleeding was found in the       gastric antrum.      A few dispersed, diminutive non-bleeding erosions were found in the       gastric antrum. There were no stigmata of recent bleeding.      No other gross lesions were noted in the entire examined stomach.       Biopsies were taken with a cold forceps for histology and Helicobacter       pylori testing.      No gross lesions were noted in the duodenal bulb, in the first portion       of the duodenum and in the second portion of the duodenum. Biopsies were       taken with a cold forceps for histology.       After completion of EGD evaluation, a guidewire was placed and the scope       was withdrawn. Dilation was performed in the entire esophagus with a       Savary dilator with no resistance at 15 mm, mild resistance at 16 mm and       moderate resistance at 17 mm. The dilation site was examined following       endoscope reinsertion and showed mild mucosal disruption, mild       improvement in luminal narrowing and no perforation. Impression:               - Widely patent and non-obstructing Schatzki ring.                            Biopsied.                           - LA Grade B esophagitis distally.                           - Z-line irregular, 41 cm from the incisors.                           - Dilation performed in the entire esophagus.                           - Small hiatal hernia.                           - Erythematous mucosa in the antrum. Non-bleeding  erosive gastropathy. Biopsied for HP.                           - No gross lesions in the duodenal bulb, in the                            first portion of the duodenum and in the second                            portion of the duodenum. Biopsied  Antimicrobials:  Anti-infectives (From admission, onward)   None     Subjective: Seen and examined at bedside and and states that she is still extremely nauseous.  Still going through withdrawal symptoms and states it was extremely bad this morning.  No chest pain, lightheadedness or dizziness but states that she was shaking significantly.  States her IV was burning with IV potassium.  No other concerns or complaints at this time and thankful for the care this provided her  Objective: Vitals:   09/14/18 0551 09/14/18 0700 09/14/18 0806 09/14/18 1304  BP: (!) 177/135 (!) 169/95 (!) 158/58 (!) 162/75  Pulse: 92 90 91 78  Resp: 17   18  Temp: 98.4 F (36.9 C)   98.8 F (37.1 C)  TempSrc: Oral   Oral  SpO2: 93%   94%  Weight:      Height:         Intake/Output Summary (Last 24 hours) at 09/14/2018 1827 Last data filed at 09/14/2018 1700 Gross per 24 hour  Intake 613.45 ml  Output --  Net 613.45 ml   Filed Weights   09/11/18 1243  Weight: 72.6 kg   Examination: Physical Exam:  Constitutional: Well-nourished, well-developed older than appearing stated age 49 female who is anxious and remains fatigued and looks uncomfortable again Eyes: Lids and conjunctive are normal.  Sclera anicteric ENMT: External ears nose appear normal.  Grossly normal hearing Neck: Appears supple with no JVD Respiratory: Mildly diminished auscultation bilaterally no appreciable wheezing, rales, rhonchi.  Patient not tachypneic or using any accessory muscles to breathe Cardiovascular: Regular rate and rhythm.  No appreciable murmurs, rubs, gallops Abdomen: Soft, tender to palpate.  Slightly distended.  Bowel sounds present GU: Deferred Musculoskeletal: No contractures or cyanosis.  No joint deformities in the upper and lower extremities Skin: Skin is warm and dry no appreciable rashes or lesions on physical evaluation Neurologic: Cranial nerves II through XII grossly intact no appreciable focal deficit Psychiatric: Appears anxious, depressed, and uncomfortable.  Not as confused.  Data Reviewed: I have personally reviewed following labs and imaging studies  CBC: Recent Labs  Lab 09/09/18 0822  09/10/18 0354 09/11/18 0320 09/12/18 0256 09/13/18 0311 09/14/18 0252  WBC 16.2*   < > 12.8* 9.6 11.8* 11.3* 10.7*  NEUTROABS 14.5*  --   --  6.2 9.3* 9.3* 8.2*  HGB 14.9   < > 14.6 13.0 13.8 14.7 14.5  HCT 45.1   < > 43.9 39.8 41.0 43.3 42.7  MCV 86.7   < > 86.8 87.7 85.4 84.6 84.7  PLT 297   < > 266 226 PLATELET CLUMPS NOTED ON SMEAR, UNABLE TO ESTIMATE 224 232   < > = values in this interval not displayed.   Basic Metabolic Panel: Recent Labs  Lab 09/10/18 0354 09/11/18 0320 09/12/18 0256 09/13/18 0311 09/14/18 0252  NA 138  140 138 139  137  K 3.8 3.8 3.8 3.2* 3.4*  CL 104 110 104 105 105  CO2 23 24 23 23  21*  GLUCOSE 121* 116* 111* 115* 130*  BUN 12 20 15 14 14   CREATININE 1.10* 1.04* 0.99 1.10* 1.03*  CALCIUM 9.4 8.8* 8.9 9.2 9.1  MG 2.0 2.0 1.9 2.0 2.0  PHOS 3.0 2.7 3.5 3.1 3.4   GFR: Estimated Creatinine Clearance: 60.5 mL/min (A) (by C-G formula based on SCr of 1.03 mg/dL (H)). Liver Function Tests: Recent Labs  Lab 09/10/18 0354 09/11/18 0320 09/12/18 0256 09/13/18 0311 09/14/18 0252  AST 31 19 25 30  41  ALT 17 18 24  41 56*  ALKPHOS 74 54 63 70 72  BILITOT 0.7 0.6 0.9 0.9 1.2  PROT 7.4 6.0* 6.6 7.6 6.9  ALBUMIN 4.1 3.2* 3.6 4.0 3.8   Recent Labs  Lab 09/09/18 0822  LIPASE 37   No results for input(s): AMMONIA in the last 168 hours. Coagulation Profile: Recent Labs  Lab 09/14/18 0252  INR 1.1   Cardiac Enzymes: No results for input(s): CKTOTAL, CKMB, CKMBINDEX, TROPONINI in the last 168 hours. BNP (last 3 results) No results for input(s): PROBNP in the last 8760 hours. HbA1C: Recent Labs    09/13/18 0311  HGBA1C 5.4   CBG: No results for input(s): GLUCAP in the last 168 hours. Lipid Profile: No results for input(s): CHOL, HDL, LDLCALC, TRIG, CHOLHDL, LDLDIRECT in the last 72 hours. Thyroid Function Tests: Recent Labs    09/13/18 1054  TSH 1.704   Anemia Panel: No results for input(s): VITAMINB12, FOLATE, FERRITIN, TIBC, IRON, RETICCTPCT in the last 72 hours. Sepsis Labs: Recent Labs  Lab 09/09/18 0718  LATICACIDVEN 1.9    Recent Results (from the past 240 hour(s))  SARS Coronavirus 2 (CEPHEID- Performed in [facility] hospital lab), Hosp Order     Status: None   Collection Time: 09/09/18  8:22 AM   Specimen: Nasopharyngeal Swab  Result Value Ref Range Status   SARS Coronavirus 2 NEGATIVE NEGATIVE Final    Comment: (NOTE) If result is NEGATIVE SARS-CoV-2 target nucleic acids are NOT DETECTED. The SARS-CoV-2 RNA is generally detectable in upper and lower  respiratory  specimens during the acute phase of infection. The lowest  concentration of SARS-CoV-2 viral copies this assay can detect is 250  copies / mL. A negative result does not preclude SARS-CoV-2 infection  and should not be used as the sole basis for treatment or other  patient management decisions.  A negative result may occur with  improper specimen collection / handling, submission of specimen other  than nasopharyngeal swab, presence of viral mutation(s) within the  areas targeted by this assay, and inadequate number of viral copies  (<250 copies / mL). A negative result must be combined with clinical  observations, patient history, and epidemiological information. If result is POSITIVE SARS-CoV-2 target nucleic acids are DETECTED. The SARS-CoV-2 RNA is generally detectable in upper and lower  respiratory specimens dur ing the acute phase of infection.  Positive  results are indicative of active infection with SARS-CoV-2.  Clinical  correlation with patient history and other diagnostic information is  necessary to determine patient infection status.  Positive results do  not rule out bacterial infection or co-infection with other viruses. If result is PRESUMPTIVE POSTIVE SARS-CoV-2 nucleic acids MAY BE PRESENT.   A presumptive positive result was obtained on the submitted specimen  and confirmed on repeat testing.  While 2019 novel coronavirus  (  SARS-CoV-2) nucleic acids may be present in the submitted sample  additional confirmatory testing may be necessary for epidemiological  and / or clinical management purposes  to differentiate between  SARS-CoV-2 and other Sarbecovirus currently known to infect humans.  If clinically indicated additional testing with an alternate test  methodology [phone]) is advised. The SARS-CoV-2 RNA is generally  detectable in upper and lower respiratory sp ecimens during the acute  phase of infection. The expected result is Negative. Fact Sheet for  Patients:  StrictlyIdeas.no Fact Sheet for Healthcare Providers: BankingDealers.co.za This test is not yet approved or cleared by the [place] FDA and has been authorized for detection and/or diagnosis of SARS-CoV-2 by FDA under an Emergency Use Authorization (EUA).  This EUA will remain in effect (meaning this test can be used) for the duration of the COVID-19 declaration under Section 564(b)(1) of the Act, 21 U.S.C. section 360bbb-3(b)(1), unless the authorization is terminated or revoked sooner. Performed at [facility], [place] [address] [place], [place] [address]    Radiology Studies: Vas [place] Lower Extremity Venous (dvt)  Result Date: 09/14/2018  Lower Venous Study Indications: Sciatic pain.  Comparison Study: No prior study on file for comparison. Performing Technologist: Sharion Dove RVS  Examination Guidelines: A complete evaluation includes B-mode imaging, spectral Doppler, color Doppler, and power Doppler as needed of all accessible portions of each vessel. Bilateral testing is considered an integral part of a complete examination. Limited examinations for reoccurring indications may be performed as noted.  +---------+---------------+---------+-----------+----------+-------+  RIGHT     Compressibility Phasicity Spontaneity Properties Summary  +---------+---------------+---------+-----------+----------+-------+  CFV       Full            Yes       Yes                             +---------+---------------+---------+-----------+----------+-------+  SFJ       Full                                                      +---------+---------------+---------+-----------+----------+-------+  FV Prox   Full                                                      +---------+---------------+---------+-----------+----------+-------+  FV Mid    Full                                                       +---------+---------------+---------+-----------+----------+-------+  FV Distal Full                                                      +---------+---------------+---------+-----------+----------+-------+  PFV       Full                                                      +---------+---------------+---------+-----------+----------+-------+  POP       Full            Yes       Yes                             +---------+---------------+---------+-----------+----------+-------+  PTV       Full                                                      +---------+---------------+---------+-----------+----------+-------+  PERO      Full                                                      +---------+---------------+---------+-----------+----------+-------+   +----+---------------+---------+-----------+----------+-------+  LEFT Compressibility Phasicity Spontaneity Properties Summary  +----+---------------+---------+-----------+----------+-------+  CFV  Full            Yes       Yes                             +----+---------------+---------+-----------+----------+-------+     Summary: Right: There is no evidence of deep vein thrombosis in the lower extremity. Left: No evidence of common femoral vein obstruction.  *See table(s) above for measurements and observations. Electronically signed by Ruta Hinds MD on 09/14/2018 at 4:04:29 PM.    Final    Scheduled Meds:  chlordiazePOXIDE  25 mg Oral QID   Followed by   Derrill Memo ON 09/15/2018] chlordiazePOXIDE  25 mg Oral TID   Followed by   Derrill Memo ON 09/16/2018] chlordiazePOXIDE  25 mg Oral BH-qamhs   Followed by   Derrill Memo ON 09/17/2018] chlordiazePOXIDE  25 mg Oral Daily   cloNIDine  0.1 mg Oral QAC breakfast   lidocaine  1 patch Transdermal Q24H   multivitamin with minerals  1 tablet Oral Daily   pantoprazole  40 mg Oral BID   pneumococcal 23 valent vaccine  0.5 mL Intramuscular Tomorrow-1000   scopolamine  1 patch Transdermal Q72H   sodium chloride flush  3 mL  Intravenous Q12H   [START ON 09/15/2018] thiamine  100 mg Oral Daily   venlafaxine XR  150 mg Oral Q breakfast   Continuous Infusions:  dextrose 5 % and 0.45 % NaCl with KCl 40 mEq/L 75 mL/hr at 09/14/18 1017   ondansetron (ZOFRAN) IV 8 mg (09/14/18 1236)    LOS: 4 days   Kerney Elbe, DO [facility] PAGER is on AMION  If 7PM-7AM, please contact night-coverage [email or web address] Password [facility] 09/14/2018, 6:27 PM

## 2018-09-14 NOTE — Progress Notes (Signed)
CSW met with the patient at bedside. The patient was awake. CSW offered to speak with the patient and provide substance abuse resources. The patient was receptive to receiving the resources. CSW thanked the patient for her time.   CSW will continue to follow as needed.   Domenic Schwab, MSW, [place] Worker [facility]  [phone]

## 2018-09-14 NOTE — Care Management Important Message (Signed)
Important Message  Patient Details  Name: Laurie [name] MRN: [id number] Date of Birth: Jul 28, 1955   Medicare Important Message Given:  Yes     Shelda Altes 09/14/2018, 4:11 PM

## 2018-09-14 NOTE — Progress Notes (Signed)
Pt for potassium IV x 4 but she was complaining of pain in IV site, MD discontinued potassium IV and change it to po.

## 2018-09-14 NOTE — Progress Notes (Signed)
Daily Rounding Note  09/14/2018, 12:30 PM  LOS: 4 days   SUBJECTIVE:   Chief complaint:   Nausea, vomiting, abdominal pain. Nausea persists though improved..  Not really eating or drinking much. Right leg pain and abdominal pain improved  OBJECTIVE:         Vital signs in last 24 hours:    Temp:  [98.3 F (36.8 C)-98.6 F (37 C)] 98.4 F (36.9 C) (08/03 0551) Pulse Rate:  [76-92] 91 (08/03 0806) Resp:  [17-18] 17 (08/03 0551) BP: (149-189)/(58-135) 158/58 (08/03 0806) SpO2:  [93 %-98 %] 93 % (08/03 0551) Last BM Date: 09/06/18 Filed Weights   09/11/18 1243  Weight: 72.6 kg   General: Tremulous.  Looks chronically and somewhat acutely ill. Heart: RRR. Chest: Diminished breath sounds but no adventitious sounds.  No dyspnea Abdomen: Soft.  Not tender or distended.  Bowel sounds hypoactive but not tinkling or tympanitic. Extremities: No CCE. Neuro/Psych: Somewhat tremulous, depressed.  Fully alert and oriented.  Intake/Output from previous day: 08/02 0701 - 08/03 0700 In: 1435.8 [P.O.:620; I.V.:285.1; IV Piggyback:530.6] Out: -   Intake/Output this shift: No intake/output data recorded.  Lab Results: Recent Labs    09/12/18 0256 09/13/18 0311 09/14/18 0252  WBC 11.8* 11.3* 10.7*  HGB 13.8 14.7 14.5  HCT 41.0 43.3 42.7  PLT PLATELET CLUMPS NOTED ON SMEAR, UNABLE TO ESTIMATE 224 232   BMET Recent Labs    09/12/18 0256 09/13/18 0311 09/14/18 0252  NA 138 139 137  K 3.8 3.2* 3.4*  CL 104 105 105  CO2 23 23 21*  GLUCOSE 111* 115* 130*  BUN 15 14 14   CREATININE 0.99 1.10* 1.03*  CALCIUM 8.9 9.2 9.1   LFT Recent Labs    09/12/18 0256 09/13/18 0311 09/14/18 0252  PROT 6.6 7.6 6.9  ALBUMIN 3.6 4.0 3.8  AST 25 30 41  ALT 24 41 56*  ALKPHOS 63 70 72  BILITOT 0.9 0.9 1.2   PT/INR Recent Labs    09/14/18 0252  LABPROT 14.1  INR 1.1   Hepatitis Panel No results for input(s): HEPBSAG,  HCVAB, HEPAIGM, HEPBIGM in the last 72 hours.  Studies/Results: Vas [place] Lower Extremity Venous (dvt)  Result Date: 09/13/2018  Lower Venous Study Indications: Sciatic pain.  Summary: Right: There is no evidence of deep vein thrombosis in the lower extremity. Left: No evidence of common femoral vein obstruction. .    Preliminary    Scheduled Meds: . chlordiazePOXIDE  25 mg Oral QID   Followed by  . [START ON 09/15/2018] chlordiazePOXIDE  25 mg Oral TID   Followed by  . [START ON 09/16/2018] chlordiazePOXIDE  25 mg Oral BH-qamhs   Followed by  . [START ON 09/17/2018] chlordiazePOXIDE  25 mg Oral Daily  . cloNIDine  0.1 mg Oral QAC breakfast  . lidocaine  1 patch Transdermal Q24H  . multivitamin with minerals  1 tablet Oral Daily  . pantoprazole  40 mg Oral BID  . pneumococcal 23 valent vaccine  0.5 mL Intramuscular Tomorrow-1000  . scopolamine  1 patch Transdermal Q72H  . sodium chloride flush  3 mL Intravenous Q12H  . thiamine  100 mg Intramuscular Once  . [START ON 09/15/2018] thiamine  100 mg Oral Daily  . venlafaxine XR  150 mg Oral Q breakfast   Continuous Infusions: . dextrose 5 % and 0.45 % NaCl with KCl 40 mEq/L 75 mL/hr at 09/14/18 1017  . ondansetron (ZOFRAN) IV  216 mL/hr at 09/14/18 0600   PRN Meds:.acetaminophen **OR** acetaminophen, chlordiazePOXIDE, dicyclomine, hydrALAZINE, hydrOXYzine, hydrOXYzine, loperamide, methocarbamol, ondansetron, promethazine   ASSESMENT:   *   Nausea and vomiting, CGE.  Initially felt to be due to sudden withdrawal of narcotics.  Had not run out of or stopped taking her antidepressants. 7/31 EGD: Moderate esophagitis.  Patent, nonobstructing Schatzki's ring, biopsied.  Small hiatal hernia.  Nonbleeding erosive gastropathy, biopsied. Pathology is still pending.   CTAP with nonobstructing right kidney stone, no findings in the stomach or bowel. Current meds include scheduled Zofran, prn IV Phenergan, prnIV zofran prn Protonix 40 po bid.    *      Chronic pain issues, using illicitly obtained narcotics for pain management.  *    Hypokalemia.  *     Mild AKI.   PLAN   *    Supportive care.  ? Add scheduled phenergan?    Azucena Freed  09/14/2018, 12:30 PM Phone [phone]

## 2018-09-15 ENCOUNTER — Inpatient Hospital Stay (HOSPITAL_COMMUNITY): Payer: Medicare Other

## 2018-09-15 DIAGNOSIS — R945 Abnormal results of liver function studies: Secondary | ICD-10-CM

## 2018-09-15 LAB — CBC WITH DIFFERENTIAL/PLATELET
Abs Immature Granulocytes: 0.04 10*3/uL (ref 0.00–0.07)
Basophils Absolute: 0.1 10*3/uL (ref 0.0–0.1)
Basophils Relative: 1 %
Eosinophils Absolute: 0.1 10*3/uL (ref 0.0–0.5)
Eosinophils Relative: 1 %
HCT: 43.4 % (ref 36.0–46.0)
Hemoglobin: 14.8 g/dL (ref 12.0–15.0)
Immature Granulocytes: 0 %
Lymphocytes Relative: 20 %
Lymphs Abs: 2.2 10*3/uL (ref 0.7–4.0)
MCH: 29.2 pg (ref 26.0–34.0)
MCHC: 34.1 g/dL (ref 30.0–36.0)
MCV: 85.6 fL (ref 80.0–100.0)
Monocytes Absolute: 0.7 10*3/uL (ref 0.1–1.0)
Monocytes Relative: 6 %
Neutro Abs: 8 10*3/uL — ABNORMAL HIGH (ref 1.7–7.7)
Neutrophils Relative %: 72 %
Platelets: 273 10*3/uL (ref 150–400)
RBC: 5.07 MIL/uL (ref 3.87–5.11)
RDW: 12.9 % (ref 11.5–15.5)
WBC: 11.1 10*3/uL — ABNORMAL HIGH (ref 4.0–10.5)
nRBC: 0 % (ref 0.0–0.2)

## 2018-09-15 LAB — COMPREHENSIVE METABOLIC PANEL
ALT: 75 U/L — ABNORMAL HIGH (ref 0–44)
AST: 67 U/L — ABNORMAL HIGH (ref 15–41)
Albumin: 3.6 g/dL (ref 3.5–5.0)
Alkaline Phosphatase: 74 U/L (ref 38–126)
Anion gap: 11 (ref 5–15)
BUN: 12 mg/dL (ref 8–23)
CO2: 20 mmol/L — ABNORMAL LOW (ref 22–32)
Calcium: 9 mg/dL (ref 8.9–10.3)
Chloride: 106 mmol/L (ref 98–111)
Creatinine, Ser: 1.1 mg/dL — ABNORMAL HIGH (ref 0.44–1.00)
GFR calc Af Amer: >60 mL/min (ref >60)
GFR calc non Af Amer: 53 mL/min — ABNORMAL LOW (ref >60)
Glucose, Bld: 130 mg/dL — ABNORMAL HIGH (ref 70–99)
Potassium: 5.2 mmol/L — ABNORMAL HIGH (ref 3.5–5.1)
Sodium: 137 mmol/L (ref 135–145)
Total Bilirubin: 2.6 mg/dL — ABNORMAL HIGH (ref 0.3–1.2)
Total Protein: 6.5 g/dL (ref 6.5–8.1)

## 2018-09-15 LAB — MAGNESIUM: Magnesium: 2.3 mg/dL (ref 1.7–2.4)

## 2018-09-15 LAB — PHOSPHORUS: Phosphorus: 3 mg/dL (ref 2.5–4.6)

## 2018-09-15 MED ORDER — METOCLOPRAMIDE HCL 5 MG/ML IJ SOLN
10.0000 mg | Freq: Four times a day (QID) | INTRAMUSCULAR | Status: AC
  Administered 2018-09-15 – 2018-09-17 (×8): 10 mg via INTRAVENOUS
  Filled 2018-09-15 (×7): qty 2

## 2018-09-15 MED ORDER — DEXTROSE-NACL 5-0.45 % IV SOLN
INTRAVENOUS | Status: DC
  Administered 2018-09-15 – 2018-09-18 (×6): via INTRAVENOUS

## 2018-09-15 MED ORDER — PROMETHAZINE HCL 25 MG/ML IJ SOLN
6.2500 mg | Freq: Three times a day (TID) | INTRAMUSCULAR | Status: DC | PRN
  Administered 2018-09-16: 04:00:00 6.25 mg via INTRAVENOUS
  Filled 2018-09-15 (×3): qty 1

## 2018-09-15 NOTE — Progress Notes (Signed)
Pt vomited once tonight. Moderate tremors.

## 2018-09-15 NOTE — Progress Notes (Signed)
PROGRESS NOTE    Laurie [name]  XHB:[id number] DOB: 1955-07-21 DOA: 09/09/2018 PCP: [name], MD   Brief Narrative:  HPI per Dr. Randa Spike on 09/09/2018 Laurie [name] is a 63 y.o. female with medical history significant of hypertension, hyperlipidemia, rheumatoid arthritis who presents the emergency department complaining of nausea vomiting abdominal pain.  Her symptoms started yesterday.  She has had no blood in her emesis or stools and denied recent travel or sick contacts she reports a nonproductive cough and she is a very reluctant historian.  She notes some sweats denies chills no known fevers temperature on arrival 100.3.  No known exposure to COVID-19.  She has diffuse abdominal pain and not able to answer if it makes anything better or worse.  She has had similar pain in the past but will not discuss it.  She was given Zofran by EMS and reports ongoing severe nausea with no recent vomiting.  She has had prior abdominal surgeries including a hysterectomy and appendectomy.  She is a daily smoker and also smokes marijuana.  In the ED white blood cell count was 16.2 and CMP was unremarkable.  COVID-19 was negative, chest x-ray shows COPD, after several hours of evaluation she tells the ED practitioner that she has been taking fentanyl which is not prescribed for her for her right lower back pain.  She states that the pain radiates down her right leg.  She has a known history of spinal disorders.  She has not been to her PCP for this and denies any falls or injuries resulting from the pain.  She ran out of fentanyl 2 days ago.  Think she may be withdrawing from the fentanyl.  Initial plan was to discharge the patient home with clonidine and Zofran with close follow-up with her PCP.  As patient was in the process of being discharged she began to vomit and her emesis was red to brown and positive for blood on Hemoccult.  Was then referred to me for further evaluation and management.   GI was also consulted.  They recommended PPI initiation and plan on endoscopy tomorrow to evaluate her esophagus and stomach and small intestine.  Patient is agreeable to the plan.  ED Course: As noted above.  Hemoglobin stable at 14 labs unremarkable but she is having occult positive emesis.  Also appears to be withdrawing.  Had difficulty keeping her clonidine down.  Required more Zofran.  **Interim History  Withdrawal protocol was started and patient placed on clonidine taper but she is still having some withdrawal symptoms continued to be nauseous.  EGD done and showed non-bleeding erosive gastropathy as well as esophagitis.  GI recommended placing the patient on PPI twice daily.  Patient continued to be to nauseous and Zofran was ineffective so started patient on Phenergan.  Diet was advanced to heart healthy diet however she is unable to tolerate this so we will start off on clears again.  I have asked GI to reevaluate given her continued nausea.  Patient continues to show withdrawal symptoms and was given one-time dose of lorazepam he will be started on a Librium taper because of lack of improvement.  Despite these measures she is persistently nauseous so GI is recommending scheduling IV metoclopramide 10 mg every 6 for 2 days and because her LFTs were trending upwards a right upper quadrant ultrasound was ordered.  Assessment & Plan:   Principal Problem:   Hematemesis Active Problems:   Depression, recurrent (HCC)   LOW  BACK PAIN   Reflux esophagitis   Polysubstance (excluding opioids) dependence, daily use (HCC)   AKI (acute kidney injury) ([place])   Opiate withdrawal (HCC)   Renal insufficiency   Elevated troponin   Leukocytosis   Hematemesis setting of esophagitis as well as nonbleeding erosive gastropathy; hematemesis improved but still continues to have nausea and vomiting -Initially thought to be related to nausea and vomiting.   -Thought to have a Mallory-Weiss tear but needed  to rule out peptic ulcer disease and esophageal gastric malignancies as well as to look for lesions and ectasias etc.   -She was started on Protonix 40 mg q12h we will continue and changed to p.o. twice daily and will continue for now -Placed on a heart healthy diet patient is not tolerating -FOBT was positive -GI evaluated and did an endoscopy on 09/11/2018 which showed a widely patent and nonobstructing Schatzki ring which was biopsied.  Patient also had LA grade B esophagitis distally and a Z line that was regular and 41 cm from the incisors.  Dilatation was performed in the entire esophagus and she also had a small hiatal hernia.  She is noted to have erythematous mucosa in the antrum with nonbleeding erosive gastropathy which was biopsied for H. pylori.  There is no gross lesions in the duodenal bulb and the first and second portion of the duodenum -GI recommending PPI twice daily for 2 months repeat an EGD in 3 months to check on healing as the patient has persistent symptoms c considering at that time of the EGD balloon dilatation versus repeat Savary and considering manometry; -Pathology came back and was nonspecific and there is no evidence of malignancy or H. pylori -GI is started scheduled Zofran, as needed IV Phenergan, as needed IV Zofran as well as Protonix p.o. twice daily; now they are adding IV Reglan 10 mg every 6h for 2 days. see below -We will need to follow-up on pathology results and advance diet as tolerated  Nausea and Vomiting, persistent -Likely 2/2 to Opiate Withdrawal -Resumed IV fluids and D5 half-normal saline +40 mEq of KCl -Antiemetics as above -Added IV Phenergan 12.5 mcg q6hprn for Refractory Nausea and Vomiting -I have asked gastroenterology to reevaluate the patient given her continued persistent nausea and they have adjusted her medications.  They have started scheduled Zofran, as needed IV Phenergan, as needed IV Zofran as well as.  Tonics 40 mg p.o. twice daily  and started a scopolamine patch as well -U/A negative for Infection  -AM Cortisol was 15.3 -Continue IV fluids and continue supportive care -She is also given IV metoclopramide 10 mg last night; GI is going to schedule IV metoclopramide 10 mg every 6 for 2 days  Opiate Withdrawal -Patient has been obtaining street fentanyl.   -UDS done on positive for opiates, cocaine, THC -She does not know how much she has been taking.  She is somewhat reluctant to give history.    Question if she also had alcohol withdrawal to -Started her on a Clonidine protocol and now on 0.1 mg p.o. daily for breakfast for 2 days -Because of lack of improvement I have also started a Librium taper in case the patient has had a history of alcohol use as well -Continued Bentyl 20 mg p.o. q. 6 as needed for spasms abdominal cramping -Continue with Hydroxyzine 25 mg p.o. every 6 as needed anxiety;  -Given a dose of IV Lorazepam 1 mg again yesterday given her severe anxiety and agitation -Started patient on  Lidocaine patch 1 patch transdermally every 12 hours -Continue with Robaxin 500 mg p.o. every 8 as needed for muscle spasms -Continue with IV Zofran 8 mg every 8 hours scheduled, with Zofran 4 mg p.o. every 6 as needed for nausea vomiting along with PPI twice daily as well as a scopolamine patch 1.5 mg transdermal every 72 hours and 12.5 mg of Phenergan every 8 hours for refractory nausea staggered between the scheduled Zofran -Blood pressure was elevated in the setting of withdrawal to now 157/70  Low Back Pain -Acetaminophen has been ordered for pain.   -Given GI bleeding will not start ibuprofen and discontinue naproxen that was ordered with clonidine protocol.   -Other medications are addictive and would like to hold off on those for now. -Continue with Lidocaine patch -Continue with Clonidine withdrawal protocol as above -Recommend OOB to Chair  Depression -Continue Venlafaxine 150 mg po Daily   Elevated  Troponin -Was initially normal but started trending upwards and is now 48 and has now trended down high-sensitivity; likely in the setting of polysubstance abuse -We will repeat x2 and trending down and last one was 27 -EKG ordered and was a normal sinus rhythm at a rate of 65 and a QTC of 457 -Cardiology will be consulted at the request of Gastroenterology for further evaluation recommendations and they recommended no further work-up and follow-up.  Polysubstance Abuse -UDS was positive for opiates, cocaine, THC -Counseling given today again -Hepatitis Panel was negative  -We will need to monitor carefully for withdrawals and continue with the clonidine taper and have added a Librium taper as well now -Social work to provide substance use counseling resources  Leukocytosis -In the setting of hematemesis nausea vomiting from opiate withdrawal and was improved but slightly worsened again -Patient's WBC went from 19.8 -> 12.8 -> 9.6 -> 11.8 -> 11.3 -> 10.7 -> 11.1 -Continue to monitor for signs and symptoms of infection -Repeat CBC in a.m.  Renal Insufficiency -Patient's BUN/creatinine went from 16/0.97 -> 12/1.10 -> 20/1.04 -> 15/0.99 -> 14/1.10 -> 14/1.03 -> 12/1.10 -Avoid nephrotoxic medications, contrast dyes, and hypotension -Continued with normal saline rate of 100 and mils per hour +40 mEq of KCl but was now stopped. C/w IVF again  with NS at 75 mL/hr as she still remains nauseous -Urinalysis done showed clear urine with yellow color, small hemoglobin, 30 protein, rare bacteria, 5 RBCs per high-power field, 0-5 squamous epithelial cells, 0-5 WBCs -Continue monitor and trend Renal Function -Repeat CMP in a.m.  Hyperglycemia -Patient's blood sugar on admission was 147 and repeat this morning on CMP was 130; blood sugars ranging from 111-147 on BMP/CMP -Check HbA1c and was 5.4 -Continue to Monitor and Trend Blood Glucose Carefully and if Necessary will place on Sensitive Novolog SSI  AC  Bradycardia -She is currently asymptomatic and likely secondary to cocaine -TSH was 1.70 -Not on any beta blocking agents will need to continue monitor carefully -Heart rate has been ranging from 53-72 the last 24 hours and is now 15 -Continue to monitor on Telemetry  Right leg swelling and pain -Check venous duplex to rule out DVT and showed: Right: There is no evidence of deep vein thrombosis in the lower extremity. Left: No evidence of common femoral vein obstruction. -Mildly bigger than the left one -Continue to Monitor   Hypokalemia -> Hyperkalemia -Patient potassium this was 3.4 and improved to 5.2 after being Replete with IV KCl fluids and p.o. KCl as she could not tolerate the other IV  40 mEq -Will D/C KCl component and C/w D5 1/2 NS  -Continue monitor and replete as necessary -Repeat CMP in the a.m.  Abnormal LFTs, worsening -AST went from 41 -> 67 and ALT went from 56 -> 75 -Check Acute Hepatitis Panel and RUQ U/S -Continue to Monitor and Trend -Repeat CMP in AM   DVT prophylaxis: SCDs Code Status: FULL CODE  Family Communication: No family present at bedside Disposition Plan: Pending Improvement of Nausea and Vomiting and Tolerance of Diet  Consultants:   Gastroenterology   Cardiology   Procedures:  EGD 09/11/2018 Findings:      No gross lesions were noted in the proximal esophagus, in the mid       esophagus and in the distal esophagus.      A widely patent and non-obstructing Schatzki ring was found at the       gastroesophageal junction. Biopsies were taken with a cold forceps for       histology to disrupt this.      LA Grade B (one or more mucosal breaks greater than 5 mm, not extending       between the tops of two mucosal folds) esophagitis was found at the       gastroesophageal junction.      The Z-line was irregular and was found 41 cm from the incisors.      A small hiatal hernia was present.      Striped mildly erythematous mucosa without  bleeding was found in the       gastric antrum.      A few dispersed, diminutive non-bleeding erosions were found in the       gastric antrum. There were no stigmata of recent bleeding.      No other gross lesions were noted in the entire examined stomach.       Biopsies were taken with a cold forceps for histology and Helicobacter       pylori testing.      No gross lesions were noted in the duodenal bulb, in the first portion       of the duodenum and in the second portion of the duodenum. Biopsies were       taken with a cold forceps for histology.      After completion of EGD evaluation, a guidewire was placed and the scope       was withdrawn. Dilation was performed in the entire esophagus with a       Savary dilator with no resistance at 15 mm, mild resistance at 16 mm and       moderate resistance at 17 mm. The dilation site was examined following       endoscope reinsertion and showed mild mucosal disruption, mild       improvement in luminal narrowing and no perforation. Impression:               - Widely patent and non-obstructing Schatzki ring.                            Biopsied.                           - LA Grade B esophagitis distally.                           - Z-line irregular, 41 cm  from the incisors.                           - Dilation performed in the entire esophagus.                           - Small hiatal hernia.                           - Erythematous mucosa in the antrum. Non-bleeding                            erosive gastropathy. Biopsied for HP.                           - No gross lesions in the duodenal bulb, in the                            first portion of the duodenum and in the second                            portion of the duodenum. Biopsied  Antimicrobials:  Anti-infectives (From admission, onward)   None     Subjective: Seen and examined at bedside and continues to be nauseous and vomited this AM. No CP or SOB but generally feeling  unwell. No other concerns or complaints at this time.   Objective: Vitals:   09/15/18 0329 09/15/18 0400 09/15/18 0905 09/15/18 1500  BP: (!) 173/88 (!) 169/85 (!) 181/81 (!) 157/78  Pulse: 96 95  96  Resp: (!) 22   20  Temp: 98.6 F (37 C)   98.5 F (36.9 C)  TempSrc: Oral   Oral  SpO2: 95%   98%  Weight:      Height:        Intake/Output Summary (Last 24 hours) at 09/15/2018 1843 Last data filed at 09/15/2018 1600 Gross per 24 hour  Intake 2048.13 ml  Output 700 ml  Net 1348.13 ml   Filed Weights   09/11/18 1243  Weight: 72.6 kg   Examination: Physical Exam:  Constitutional: WN/WD older than appearing stated age 62 female who is anxious and remains uncomfortable and looks unwell.  Eyes: Lids and conjunctivae Normal Sclera anicteric ENMT: External Ears and Nose appear normal. Grossly normal hearing.  Neck: Appears supple with no JVD Respiratory: Diminished to auscultation bilaterally; No appreciable wheezing/rales/rhonchi; Unlabored breathing Cardiovascular: RRR; No appreciable m/r/g Abdomen: Soft, Tender to palpated. Mildly distended. Bowel sounds present GU: Deferred Musculoskeletal: No contractures or cyanosis; No joint deformities on a limited skin eval Skin: Skin is warm and Dry with no appreciable Rashes or lesions Neurologic: CN 2-12 grossly intact. No appreciable focal deficits Psychiatric: Anxious mood and appears depressed and uncomfortable.   Data Reviewed: I have personally reviewed following labs and imaging studies  CBC: Recent Labs  Lab 09/11/18 0320 09/12/18 0256 09/13/18 0311 09/14/18 0252 09/15/18 0241  WBC 9.6 11.8* 11.3* 10.7* 11.1*  NEUTROABS 6.2 9.3* 9.3* 8.2* 8.0*  HGB 13.0 13.8 14.7 14.5 14.8  HCT 39.8 41.0 43.3 42.7 43.4  MCV 87.7 85.4 84.6 84.7 85.6  PLT 226 PLATELET CLUMPS NOTED ON SMEAR, UNABLE TO ESTIMATE 224 232 818   Basic Metabolic Panel:  Recent Labs  Lab 09/11/18 0320 09/12/18 0256 09/13/18 0311 09/14/18 0252  09/15/18 0241  NA 140 138 139 137 137  K 3.8 3.8 3.2* 3.4* 5.2*  CL 110 104 105 105 106  CO2 24 23 23  21* 20*  GLUCOSE 116* 111* 115* 130* 130*  BUN 20 15 14 14 12   CREATININE 1.04* 0.99 1.10* 1.03* 1.10*  CALCIUM 8.8* 8.9 9.2 9.1 9.0  MG 2.0 1.9 2.0 2.0 2.3  PHOS 2.7 3.5 3.1 3.4 3.0   GFR: Estimated Creatinine Clearance: 56.6 mL/min (A) (by C-G formula based on SCr of 1.1 mg/dL (H)). Liver Function Tests: Recent Labs  Lab 09/11/18 0320 09/12/18 0256 09/13/18 0311 09/14/18 0252 09/15/18 0241  AST 19 25 30  41 67*  ALT 18 24 41 56* 75*  ALKPHOS 54 63 70 72 74  BILITOT 0.6 0.9 0.9 1.2 2.6*  PROT 6.0* 6.6 7.6 6.9 6.5  ALBUMIN 3.2* 3.6 4.0 3.8 3.6   Recent Labs  Lab 09/09/18 0822  LIPASE 37   No results for input(s): AMMONIA in the last 168 hours. Coagulation Profile: Recent Labs  Lab 09/14/18 0252  INR 1.1   Cardiac Enzymes: No results for input(s): CKTOTAL, CKMB, CKMBINDEX, TROPONINI in the last 168 hours. BNP (last 3 results) No results for input(s): PROBNP in the last 8760 hours. HbA1C: Recent Labs    09/13/18 0311  HGBA1C 5.4   CBG: No results for input(s): GLUCAP in the last 168 hours. Lipid Profile: No results for input(s): CHOL, HDL, LDLCALC, TRIG, CHOLHDL, LDLDIRECT in the last 72 hours. Thyroid Function Tests: Recent Labs    09/13/18 1054  TSH 1.704   Anemia Panel: No results for input(s): VITAMINB12, FOLATE, FERRITIN, TIBC, IRON, RETICCTPCT in the last 72 hours. Sepsis Labs: Recent Labs  Lab 09/09/18 0718  LATICACIDVEN 1.9    Recent Results (from the past 240 hour(s))  SARS Coronavirus 2 (CEPHEID- Performed in [facility] hospital lab), Hosp Order     Status: None   Collection Time: 09/09/18  8:22 AM   Specimen: Nasopharyngeal Swab  Result Value Ref Range Status   SARS Coronavirus 2 NEGATIVE NEGATIVE Final    Comment: (NOTE) If result is NEGATIVE SARS-CoV-2 target nucleic acids are NOT DETECTED. The SARS-CoV-2 RNA is generally  detectable in upper and lower  respiratory specimens during the acute phase of infection. The lowest  concentration of SARS-CoV-2 viral copies this assay can detect is 250  copies / mL. A negative result does not preclude SARS-CoV-2 infection  and should not be used as the sole basis for treatment or other  patient management decisions.  A negative result may occur with  improper specimen collection / handling, submission of specimen other  than nasopharyngeal swab, presence of viral mutation(s) within the  areas targeted by this assay, and inadequate number of viral copies  (<250 copies / mL). A negative result must be combined with clinical  observations, patient history, and epidemiological information. If result is POSITIVE SARS-CoV-2 target nucleic acids are DETECTED. The SARS-CoV-2 RNA is generally detectable in upper and lower  respiratory specimens dur ing the acute phase of infection.  Positive  results are indicative of active infection with SARS-CoV-2.  Clinical  correlation with patient history and other diagnostic information is  necessary to determine patient infection status.  Positive results do  not rule out bacterial infection or co-infection with other viruses. If result is PRESUMPTIVE POSTIVE SARS-CoV-2 nucleic acids MAY BE PRESENT.   A presumptive positive result was  obtained on the submitted specimen  and confirmed on repeat testing.  While 2019 novel coronavirus  (SARS-CoV-2) nucleic acids may be present in the submitted sample  additional confirmatory testing may be necessary for epidemiological  and / or clinical management purposes  to differentiate between  SARS-CoV-2 and other Sarbecovirus currently known to infect humans.  If clinically indicated additional testing with an alternate test  methodology [phone]) is advised. The SARS-CoV-2 RNA is generally  detectable in upper and lower respiratory sp ecimens during the acute  phase of infection. The  expected result is Negative. Fact Sheet for Patients:  StrictlyIdeas.no Fact Sheet for Healthcare Providers: BankingDealers.co.za This test is not yet approved or cleared by the [place] FDA and has been authorized for detection and/or diagnosis of SARS-CoV-2 by FDA under an Emergency Use Authorization (EUA).  This EUA will remain in effect (meaning this test can be used) for the duration of the COVID-19 declaration under Section 564(b)(1) of the Act, 21 U.S.C. section 360bbb-3(b)(1), unless the authorization is terminated or revoked sooner. Performed at [facility], [place] [address], [place], [place] [address]    Radiology Studies: US Abdomen Limited Ruq  Result Date: 09/15/2018 CLINICAL DATA:  Abnormal LFTs EXAM: ULTRASOUND ABDOMEN LIMITED RIGHT UPPER QUADRANT COMPARISON:  CT abdomen September 09, 2018. FINDINGS: Gallbladder: No gallstones or wall thickening visualized. No sonographic Murphy sign noted by sonographer. Common bile duct: Diameter: 7 mm Liver: Increased echotexture seen throughout. No focal abnormality or biliary ductal dilatation. Portal vein is patent on color Doppler imaging with normal direction of blood flow towards the liver. Other: None. IMPRESSION: 1. Hepatic steatosis 2. Mildly dilated common bile duct without cholecystitis or choledocholithiasis. This is nonspecific, however unchanged since prior CT. Electronically Signed   By: Prudencio Pair M.D.   On: 09/15/2018 16:05   Scheduled Meds:  chlordiazePOXIDE  25 mg Oral TID   Followed by   Derrill Memo ON 09/16/2018] chlordiazePOXIDE  25 mg Oral BH-qamhs   Followed by   Derrill Memo ON 09/17/2018] chlordiazePOXIDE  25 mg Oral Daily   lidocaine  1 patch Transdermal Q24H   metoCLOPramide (REGLAN) injection  10 mg Intravenous Q6H   multivitamin with minerals  1 tablet Oral Daily   pantoprazole  40 mg Oral BID   pneumococcal 23 valent vaccine  0.5 mL Intramuscular Tomorrow-1000    scopolamine  1 patch Transdermal Q72H   sodium chloride flush  3 mL Intravenous Q12H   thiamine  100 mg Oral Daily   venlafaxine XR  150 mg Oral Q breakfast   Continuous Infusions:  dextrose 5 % and 0.45% NaCl 75 mL/hr at 09/15/18 1349   ondansetron (ZOFRAN) IV Stopped (09/15/18 1349)    LOS: 5 days   Kerney Elbe, DO [facility] PAGER is on AMION  If 7PM-7AM, please contact night-coverage [email or web address] Password [facility] 09/15/2018, 6:43 PM

## 2018-09-15 NOTE — Progress Notes (Signed)
Daily Rounding Note  09/15/2018, 11:10 AM  LOS: 5 days   SUBJECTIVE:   Chief complaint:     vomited this AM.  Not bloody or CG.  Persistent vomiting of clear material.   Last BM was day of admission 7/29.   Back and right leg pain, not much abd pain.    OBJECTIVE:         Vital signs in last 24 hours:    Temp:  [98.3 F (36.8 C)-98.8 F (37.1 C)] 98.6 F (37 C) (08/04 0329) Pulse Rate:  [78-96] 95 (08/04 0400) Resp:  [18-22] 22 (08/04 0329) BP: (158-181)/(75-88) 181/81 (08/04 0905) SpO2:  [94 %-96 %] 95 % (08/04 0329) Last BM Date: 09/11/18 Filed Weights   09/11/18 1243  Weight: 72.6 kg   General: looks miserable, chronically and acutely ill, not toxic   Heart: RRR Chest: clear bil.   Abdomen: soft, diffuse tenderness, a bit >> on left  Emesis is clear, watery.   Extremities: no CCE Neuro/Psych:  Oriented x 3. Slightly tremulous.    Intake/Output from previous day: 08/03 0701 - 08/04 0700 In: 1293 [P.O.:540; I.V.:753] Out: 700 [Urine:700]  Intake/Output this shift: Total I/O In: 247 [P.O.:247] Out: -   Lab Results: Recent Labs    09/13/18 0311 09/14/18 0252 09/15/18 0241  WBC 11.3* 10.7* 11.1*  HGB 14.7 14.5 14.8  HCT 43.3 42.7 43.4  PLT 224 232 273   BMET Recent Labs    09/13/18 0311 09/14/18 0252 09/15/18 0241  NA 139 137 137  K 3.2* 3.4* 5.2*  CL 105 105 106  CO2 23 21* 20*  GLUCOSE 115* 130* 130*  BUN 14 14 12   CREATININE 1.10* 1.03* 1.10*  CALCIUM 9.2 9.1 9.0   LFT Recent Labs    09/13/18 0311 09/14/18 0252 09/15/18 0241  PROT 7.6 6.9 6.5  ALBUMIN 4.0 3.8 3.6  AST 30 41 67*  ALT 41 56* 75*  ALKPHOS 70 72 74  BILITOT 0.9 1.2 2.6*   PT/INR Recent Labs    09/14/18 0252  LABPROT 14.1  INR 1.1    Studies/Results: Vas [place] Lower Extremity Venous (dvt)  Result Date: 09/14/2018  Lower Venous Study Indications: Summary: Right: There is no evidence of deep vein  thrombosis in the lower extremity. Left: No evidence of common femoral vein obstruction.  *See table(s) above for measurements and observations. Electronically signed by Ruta Hinds MD on 09/14/2018 at 4:04:29 PM.    Final     Scheduled Meds: . chlordiazePOXIDE  25 mg Oral TID   Followed by  . [START ON 09/16/2018] chlordiazePOXIDE  25 mg Oral BH-qamhs   Followed by  . [START ON 09/17/2018] chlordiazePOXIDE  25 mg Oral Daily  . lidocaine  1 patch Transdermal Q24H  . multivitamin with minerals  1 tablet Oral Daily  . pantoprazole  40 mg Oral BID  . pneumococcal 23 valent vaccine  0.5 mL Intramuscular Tomorrow-1000  . scopolamine  1 patch Transdermal Q72H  . sodium chloride flush  3 mL Intravenous Q12H  . thiamine  100 mg Oral Daily  . venlafaxine XR  150 mg Oral Q breakfast   Continuous Infusions: . dextrose 5 % and 0.45% NaCl 75 mL/hr at 09/15/18 0942  . ondansetron [facility]) IV 8 mg (09/15/18 0538)   PRN Meds:.acetaminophen **OR** acetaminophen, chlordiazePOXIDE, hydrALAZINE, hydrOXYzine, loperamide, ondansetron, promethazine   ASSESMENT:   *   Nausea and vomiting, CGE.  Initially felt to  be due to sudden withdrawal of narcotics.  Had not run out of or stopped taking her antidepressants. 7/31 EGD: Moderate esophagitis.  Patent, nonobstructing Schatzki's ring, biopsied. Small hiatal hernia.  Nonbleeding erosive gastropathy, biopsied. Pathology: normal SB mucosa.  Mild chronic gastritis.  No H. pylori.  Reflux changes in the esophagus and GEJx with benign squamous mucosa, no intestinal metaplasia, dysplasia, malignancy.  No increased intraepithelial eosinophils..   CTAP with nonobstructing right kidney stone, no findings in the stomach or bowel. Current meds include Scopalamine patch, scheduled Zofran, Protonix 40 po bid, prn IV Phenergan, prn po zofran, .   Despite all, N/V continues.  LFTs rising.    *     Chronic pain issues, using illicitly obtained narcotics for pain management.  Topical lidoderm in place  *    Hyperkalemia, was hypokalemic yesterday.  .  *     Mild AKI.    PLAN   *   Adding scheduled reglan 10 mg IV q 6 hours x 2 days.    *   Hospitalist ordered and abd [place], if unrevealing consider plain abdominal films or repeat CT.        Azucena Freed  09/15/2018, 11:10 AM Phone [phone]

## 2018-09-16 DIAGNOSIS — F1123 Opioid dependence with withdrawal: Secondary | ICD-10-CM

## 2018-09-16 LAB — COMPREHENSIVE METABOLIC PANEL
ALT: 73 U/L — ABNORMAL HIGH (ref 0–44)
AST: 31 U/L (ref 15–41)
Albumin: 3.5 g/dL (ref 3.5–5.0)
Alkaline Phosphatase: 71 U/L (ref 38–126)
Anion gap: 10 (ref 5–15)
BUN: 11 mg/dL (ref 8–23)
CO2: 23 mmol/L (ref 22–32)
Calcium: 9.1 mg/dL (ref 8.9–10.3)
Chloride: 105 mmol/L (ref 98–111)
Creatinine, Ser: 1.16 mg/dL — ABNORMAL HIGH (ref 0.44–1.00)
GFR calc Af Amer: 58 mL/min — ABNORMAL LOW (ref >60)
GFR calc non Af Amer: 50 mL/min — ABNORMAL LOW (ref >60)
Glucose, Bld: 125 mg/dL — ABNORMAL HIGH (ref 70–99)
Potassium: 3.7 mmol/L (ref 3.5–5.1)
Sodium: 138 mmol/L (ref 135–145)
Total Bilirubin: 0.9 mg/dL (ref 0.3–1.2)
Total Protein: 6.6 g/dL (ref 6.5–8.1)

## 2018-09-16 LAB — CBC WITH DIFFERENTIAL/PLATELET
Abs Immature Granulocytes: 0.04 10*3/uL (ref 0.00–0.07)
Basophils Absolute: 0.1 10*3/uL (ref 0.0–0.1)
Basophils Relative: 1 %
Eosinophils Absolute: 0.3 10*3/uL (ref 0.0–0.5)
Eosinophils Relative: 2 %
HCT: 42 % (ref 36.0–46.0)
Hemoglobin: 13.9 g/dL (ref 12.0–15.0)
Immature Granulocytes: 0 %
Lymphocytes Relative: 21 %
Lymphs Abs: 2.4 10*3/uL (ref 0.7–4.0)
MCH: 28.5 pg (ref 26.0–34.0)
MCHC: 33.1 g/dL (ref 30.0–36.0)
MCV: 86.2 fL (ref 80.0–100.0)
Monocytes Absolute: 0.7 10*3/uL (ref 0.1–1.0)
Monocytes Relative: 6 %
Neutro Abs: 7.8 10*3/uL — ABNORMAL HIGH (ref 1.7–7.7)
Neutrophils Relative %: 70 %
Platelets: 246 10*3/uL (ref 150–400)
RBC: 4.87 MIL/uL (ref 3.87–5.11)
RDW: 13 % (ref 11.5–15.5)
WBC: 11.2 10*3/uL — ABNORMAL HIGH (ref 4.0–10.5)
nRBC: 0 % (ref 0.0–0.2)

## 2018-09-16 LAB — HEPATITIS PANEL, ACUTE
HCV Ab: <0.1 s/co ratio (ref 0.0–0.9)
Hep A IgM: NEGATIVE
Hep B C IgM: NEGATIVE
Hepatitis B Surface Ag: NEGATIVE

## 2018-09-16 LAB — MAGNESIUM: Magnesium: 2.2 mg/dL (ref 1.7–2.4)

## 2018-09-16 LAB — PHOSPHORUS: Phosphorus: 3.4 mg/dL (ref 2.5–4.6)

## 2018-09-16 NOTE — Progress Notes (Signed)
PROGRESS NOTE    Laurie [name]  HCW:[id number] DOB: 1955-08-31 DOA: 09/09/2018 PCP: [name], MD      Brief Narrative:  Mrs. Laurie [name] is a 63 y.o. F with HTN, RA not on DMARD and chronic neck and back pain who presents with nausea, vomiting, and abdominal pain.  In the ER, she had normal LFTs and a benign exam, she was to be discharged except that she had persistent vomiting, and then hematemesis was witnessed in the ER and so she was admitted for GI evaluation.  She also did confess to EDP that she had been using illicit fentanyl for many months to treat neck pain, and had recently stopped.     Assessment & Plan:  Hematemesis Transient.  EGD performed and was unremarkable.  Intractable vomiting CT abdomen unremarkable.  US abdomen unremarkable.  Electrolytes and LFTs unremarkable.   Abdomen benign, soft.  I do not think further imaging will be high yield. -Continue MIVF -Advance diet as able -Consult GI, appreciate expert cares -Defer anti-emetic strategy, antacids and scopolamine to GI guidance  Opiate withdrawal I am unclear that her weakness and vomiting are not simply manifestations of her opiate withdrawal.  She expects to abstain from opiates after discharge.   -Continue Librium taper  Anemia, normocytic Mild, stable, no clinical bleeding since the ER.  Hypokaelmia Resolved  Depression -Continue venlafaxine      MDM and disposition: The below labs and imaging reports were reviewed and summarized above.  Medication management as above.  The patient was admitted with intractable nausea and vomiting.  She remains unable to take more than 5% of meals.      Continue IV fluids, advance diet as tolerated.     DVT prophylaxis: SCDs Code Status: FULL Family Communication:     Consultants:   GI  Procedures:   EGD 7/31     Subjective: Extremely tired, fatigued.  Still vomiting any intake.  No appetite.  Nursing confirm patient  report that she vomited this morning and was unable to take anything by mouth. No fever, no cough.  Mild sore throat.  She endorses dyspnea at rest which is chronic and which she cannot distinguish from fatigue.  Objective: Vitals:   09/15/18 2116 09/16/18 0000 09/16/18 0442 09/16/18 0600  BP: (!) 179/73 (!) 159/85 (!) 174/81 (!) 151/74  Pulse: 79  84 (!) 101  Resp: 18  19   Temp: 98 F (36.7 C)  99.1 F (37.3 C)   TempSrc: Oral  Oral   SpO2: 99%  94%   Weight:      Height:        Intake/Output Summary (Last 24 hours) at 09/16/2018 1056 Last data filed at 09/15/2018 2116 Gross per 24 hour  Intake 809.13 ml  Output -  Net 809.13 ml   Filed Weights   09/11/18 1243  Weight: 72.6 kg    Examination: General appearance:  adult female, alert and in no acute distress.   HEENT: Anicteric, conjunctiva pink, lids and lashes normal. No nasal deformity, discharge, epistaxis.  Lips moist, dentition normal, op tacky dry no oral lesions, hearing normal.   Skin: Warm and dry.  no jaundice.  No suspicious rashes or lesions. Cardiac: RRR, nl S1-S2, no murmurs appreciated.  Capillary refill is brisk.  JVP normal.  No LE edema.  Radial pulses 2+ and symmetric. Respiratory: Normal respiratory rate and rhythm.  CTAB without rales or wheezes. Abdomen: Abdomen soft.   No TTP, no guarding, no rebound.  No ascites, distension, hepatosplenomegaly.   MSK: No deformities or effusions. Neuro: Awake and alert.  EOMI, moves all extremities. Speech fluent.    Psych: Sensorium intact and responding to questions, attention normal. Affect blunted.  Judgment and insight appear normal.    Data Reviewed: I have personally reviewed following labs and imaging studies:  CBC: Recent Labs  Lab 09/12/18 0256 09/13/18 0311 09/14/18 0252 09/15/18 0241 09/16/18 0159  WBC 11.8* 11.3* 10.7* 11.1* 11.2*  NEUTROABS 9.3* 9.3* 8.2* 8.0* 7.8*  HGB 13.8 14.7 14.5 14.8 13.9  HCT 41.0 43.3 42.7 43.4 42.0  MCV 85.4 84.6  84.7 85.6 86.2  PLT PLATELET CLUMPS NOTED ON SMEAR, UNABLE TO ESTIMATE 224 232 273 938   Basic Metabolic Panel: Recent Labs  Lab 09/12/18 0256 09/13/18 0311 09/14/18 0252 09/15/18 0241 09/16/18 0159  NA 138 139 137 137 138  K 3.8 3.2* 3.4* 5.2* 3.7  CL 104 105 105 106 105  CO2 23 23 21* 20* 23  GLUCOSE 111* 115* 130* 130* 125*  BUN 15 14 14 12 11   CREATININE 0.99 1.10* 1.03* 1.10* 1.16*  CALCIUM 8.9 9.2 9.1 9.0 9.1  MG 1.9 2.0 2.0 2.3 2.2  PHOS 3.5 3.1 3.4 3.0 3.4   GFR: Estimated Creatinine Clearance: 53.7 mL/min (A) (by C-G formula based on SCr of 1.16 mg/dL (H)). Liver Function Tests: Recent Labs  Lab 09/12/18 0256 09/13/18 0311 09/14/18 0252 09/15/18 0241 09/16/18 0159  AST 25 30 41 67* 31  ALT 24 41 56* 75* 73*  ALKPHOS 63 70 72 74 71  BILITOT 0.9 0.9 1.2 2.6* 0.9  PROT 6.6 7.6 6.9 6.5 6.6  ALBUMIN 3.6 4.0 3.8 3.6 3.5   No results for input(s): LIPASE, AMYLASE in the last 168 hours. No results for input(s): AMMONIA in the last 168 hours. Coagulation Profile: Recent Labs  Lab 09/14/18 0252  INR 1.1   Cardiac Enzymes: No results for input(s): CKTOTAL, CKMB, CKMBINDEX, TROPONINI in the last 168 hours. BNP (last 3 results) No results for input(s): PROBNP in the last 8760 hours. HbA1C: No results for input(s): HGBA1C in the last 72 hours. CBG: No results for input(s): GLUCAP in the last 168 hours. Lipid Profile: No results for input(s): CHOL, HDL, LDLCALC, TRIG, CHOLHDL, LDLDIRECT in the last 72 hours. Thyroid Function Tests: No results for input(s): TSH, T4TOTAL, FREET4, T3FREE, THYROIDAB in the last 72 hours. Anemia Panel: No results for input(s): VITAMINB12, FOLATE, FERRITIN, TIBC, IRON, RETICCTPCT in the last 72 hours. Urine analysis:    Component Value Date/Time   COLORURINE YELLOW 09/13/2018 [place] 09/13/2018 1514   LABSPEC 1.011 09/13/2018 1514   PHURINE 6.0 09/13/2018 1514   GLUCOSEU NEGATIVE 09/13/2018 1514   GLUCOSEU  NEGATIVE 11/04/2011 1127   HGBUR SMALL (A) 09/13/2018 1514   HGBUR negative 01/01/2010 1055   BILIRUBINUR NEGATIVE 09/13/2018 1514   BILIRUBINUR neg 11/14/2017 0859   KETONESUR 5 (A) 09/13/2018 1514   PROTEINUR 30 (A) 09/13/2018 1514   UROBILINOGEN 0.2 11/14/2017 0859   UROBILINOGEN 0.2 11/04/2011 1127   NITRITE NEGATIVE 09/13/2018 1514   LEUKOCYTESUR NEGATIVE 09/13/2018 1514   Sepsis Labs: @LABRCNTIP (procalcitonin:4,lacticacidven:4)  ) Recent Results (from the past 240 hour(s))  SARS Coronavirus 2 (CEPHEID- Performed in [place] hospital lab), Hosp Order     Status: None   Collection Time: 09/09/18  8:22 AM   Specimen: Nasopharyngeal Swab  Result Value Ref Range Status   SARS Coronavirus 2 NEGATIVE NEGATIVE Final    Comment: (NOTE)  If result is NEGATIVE SARS-CoV-2 target nucleic acids are NOT DETECTED. The SARS-CoV-2 RNA is generally detectable in upper and lower  respiratory specimens during the acute phase of infection. The lowest  concentration of SARS-CoV-2 viral copies this assay can detect is 250  copies / mL. A negative result does not preclude SARS-CoV-2 infection  and should not be used as the sole basis for treatment or other  patient management decisions.  A negative result may occur with  improper specimen collection / handling, submission of specimen other  than nasopharyngeal swab, presence of viral mutation(s) within the  areas targeted by this assay, and inadequate number of viral copies  (<250 copies / mL). A negative result must be combined with clinical  observations, patient history, and epidemiological information. If result is POSITIVE SARS-CoV-2 target nucleic acids are DETECTED. The SARS-CoV-2 RNA is generally detectable in upper and lower  respiratory specimens dur ing the acute phase of infection.  Positive  results are indicative of active infection with SARS-CoV-2.  Clinical  correlation with patient history and other diagnostic information is   necessary to determine patient infection status.  Positive results do  not rule out bacterial infection or co-infection with other viruses. If result is PRESUMPTIVE POSTIVE SARS-CoV-2 nucleic acids MAY BE PRESENT.   A presumptive positive result was obtained on the submitted specimen  and confirmed on repeat testing.  While 2019 novel coronavirus  (SARS-CoV-2) nucleic acids may be present in the submitted sample  additional confirmatory testing may be necessary for epidemiological  and / or clinical management purposes  to differentiate between  SARS-CoV-2 and other Sarbecovirus currently known to infect humans.  If clinically indicated additional testing with an alternate test  methodology [phone]) is advised. The SARS-CoV-2 RNA is generally  detectable in upper and lower respiratory sp ecimens during the acute  phase of infection. The expected result is Negative. Fact Sheet for Patients:  StrictlyIdeas.no Fact Sheet for Healthcare Providers: BankingDealers.co.za This test is not yet approved or cleared by the [place] FDA and has been authorized for detection and/or diagnosis of SARS-CoV-2 by FDA under an Emergency Use Authorization (EUA).  This EUA will remain in effect (meaning this test can be used) for the duration of the COVID-19 declaration under Section 564(b)(1) of the Act, 21 U.S.C. section 360bbb-3(b)(1), unless the authorization is terminated or revoked sooner. Performed at [facility], [place] [address], [place], [place] [address]          Radiology Studies: US Abdomen Limited Ruq  Result Date: 09/15/2018 CLINICAL DATA:  Abnormal LFTs EXAM: ULTRASOUND ABDOMEN LIMITED RIGHT UPPER QUADRANT COMPARISON:  CT abdomen September 09, 2018. FINDINGS: Gallbladder: No gallstones or wall thickening visualized. No sonographic Murphy sign noted by sonographer. Common bile duct: Diameter: 7 mm Liver: Increased echotexture seen  throughout. No focal abnormality or biliary ductal dilatation. Portal vein is patent on color Doppler imaging with normal direction of blood flow towards the liver. Other: None. IMPRESSION: 1. Hepatic steatosis 2. Mildly dilated common bile duct without cholecystitis or choledocholithiasis. This is nonspecific, however unchanged since prior CT. Electronically Signed   By: Prudencio Pair M.D.   On: 09/15/2018 16:05        Scheduled Meds: . chlordiazePOXIDE  25 mg Oral BH-qamhs   Followed by  . [START ON 09/17/2018] chlordiazePOXIDE  25 mg Oral Daily  . lidocaine  1 patch Transdermal Q24H  . metoCLOPramide (REGLAN) injection  10 mg Intravenous Q6H  . multivitamin with minerals  1 tablet Oral Daily  . pantoprazole  40 mg Oral BID  . pneumococcal 23 valent vaccine  0.5 mL Intramuscular Tomorrow-1000  . scopolamine  1 patch Transdermal Q72H  . sodium chloride flush  3 mL Intravenous Q12H  . thiamine  100 mg Oral Daily  . venlafaxine XR  150 mg Oral Q breakfast   Continuous Infusions: . dextrose 5 % and 0.45% NaCl 75 mL/hr at 09/15/18 2359  . ondansetron (ZOFRAN) IV 8 mg (09/16/18 0525)     LOS: 6 days    Time spent: 25 minutes    Edwin Dada, MD [facility] 09/16/2018, 10:56 AM     Please page through [place]:  [email or web address] Password [email or web address] If 7PM-7AM, please contact night-coverage

## 2018-09-16 NOTE — Progress Notes (Signed)
Daily Rounding Note  09/16/2018, 2:13 PM  LOS: 6 days   SUBJECTIVE:   Chief complaint: Nausea, vomiting Nausea, dry heaves persist No BM since day of admission.    OBJECTIVE:         Vital signs in last 24 hours:    Temp:  [98 F (36.7 C)-99.1 F (37.3 C)] 98.8 F (37.1 C) (08/05 1313) Pulse Rate:  [79-101] 84 (08/05 1313) Resp:  [18-20] 18 (08/05 1313) BP: (151-179)/(62-85) 156/62 (08/05 1313) SpO2:  [94 %-99 %] 95 % (08/05 1313) Last BM Date: 09/11/18 Filed Weights   09/11/18 1243  Weight: 72.6 kg   General: Looks ill. Heart: RRR. Chest: No labored breathing or cough.  Lungs clear bilaterally Abdomen: Soft.  Minimal nonfocal tenderness. Extremities: No CCE. Neuro/Psych: Depressed but alert.  No gross deficits.  No confusion.  Intake/Output from previous day: 08/04 0701 - 08/05 0700 In: 1056.1 [P.O.:365; I.V.:451.6; IV Piggyback:239.6] Out: -   Intake/Output this shift: No intake/output data recorded.  Lab Results: Recent Labs    09/14/18 0252 09/15/18 0241 09/16/18 0159  WBC 10.7* 11.1* 11.2*  HGB 14.5 14.8 13.9  HCT 42.7 43.4 42.0  PLT 232 273 246   BMET Recent Labs    09/14/18 0252 09/15/18 0241 09/16/18 0159  NA 137 137 138  K 3.4* 5.2* 3.7  CL 105 106 105  CO2 21* 20* 23  GLUCOSE 130* 130* 125*  BUN 14 12 11   CREATININE 1.03* 1.10* 1.16*  CALCIUM 9.1 9.0 9.1   LFT Recent Labs    09/14/18 0252 09/15/18 0241 09/16/18 0159  PROT 6.9 6.5 6.6  ALBUMIN 3.8 3.6 3.5  AST 41 67* 31  ALT 56* 75* 73*  ALKPHOS 72 74 71  BILITOT 1.2 2.6* 0.9   PT/INR Recent Labs    09/14/18 0252  LABPROT 14.1  INR 1.1   Hepatitis Panel No results for input(s): HEPBSAG, HCVAB, HEPAIGM, HEPBIGM in the last 72 hours.  Studies/Results: US Abdomen Limited Ruq  Result Date: 09/15/2018 CLINICAL DATA:  Abnormal LFTs EXAM: ULTRASOUND ABDOMEN LIMITED RIGHT UPPER QUADRANT COMPARISON:  CT abdomen September 09, 2018. FINDINGS: Gallbladder: No gallstones or wall thickening visualized. No sonographic Murphy sign noted by sonographer. Common bile duct: Diameter: 7 mm Liver: Increased echotexture seen throughout. No focal abnormality or biliary ductal dilatation. Portal vein is patent on color Doppler imaging with normal direction of blood flow towards the liver. Other: None. IMPRESSION: 1. Hepatic steatosis 2. Mildly dilated common bile duct without cholecystitis or choledocholithiasis. This is nonspecific, however unchanged since prior CT. Electronically Signed   By: Prudencio Pair M.D.   On: 09/15/2018 16:05   Scheduled Meds: . chlordiazePOXIDE  25 mg Oral BH-qamhs   Followed by  . [START ON 09/17/2018] chlordiazePOXIDE  25 mg Oral Daily  . lidocaine  1 patch Transdermal Q24H  . metoCLOPramide (REGLAN) injection  10 mg Intravenous Q6H  . multivitamin with minerals  1 tablet Oral Daily  . pantoprazole  40 mg Oral BID  . pneumococcal 23 valent vaccine  0.5 mL Intramuscular Tomorrow-1000  . scopolamine  1 patch Transdermal Q72H  . sodium chloride flush  3 mL Intravenous Q12H  . thiamine  100 mg Oral Daily  . venlafaxine XR  150 mg Oral Q breakfast   Continuous Infusions: . dextrose 5 % and 0.45% NaCl 75 mL/hr at 09/16/18 1304  . ondansetron (ZOFRAN) IV 8 mg (09/16/18 1309)   PRN Meds:.acetaminophen **  OR** acetaminophen, chlordiazePOXIDE, hydrALAZINE, hydrOXYzine, ondansetron, promethazine  ASSESMENT:   *    Assistant nausea and vomiting. 7/31 BMW:[id number] esophagitis. Patent, nonobstructing Schatzki's ring, biopsied. Small hiatal hernia. Nonbleeding erosive gastropathy, biopsied. Pathology: normal SB mucosa.  Mild chronic gastritis.  No H. pylori.  Reflux changes in the esophagus and GEJx with benign squamous mucosa, no intestinal metaplasia, dysplasia, malignancy.  No increased intraepithelial eosinophils.. CTAP with nonobstructing right kidney stone, no findings in the stomach or bowel. Mild  elevation LFTs in the last 3 days, not present at admission.  Abdominal ultrasound shows fatty liver, 7 mm CBD but no cholecystitis, no cholecystitis. Scopolamine patch, scheduled IV Reglan, oral bid Protonix, short course Librium, scheduled IV Reglan in place with limited improvement.   ? Are her symptoms lingering side effects from opiate withdrawal?   *     Chronic musculoskeletal pain.  Illicit narcotics for pain management PTA with abrupt cessation PTA   PLAN   *  Has another day of scheduled IV Reglan    Laurie [name]  09/16/2018, 2:13 PM Phone [phone]

## 2018-09-16 NOTE — Plan of Care (Signed)
  Problem: Nutrition: Goal: Adequate nutrition will be maintained Outcome: Progressing   Problem: Coping: Goal: Level of anxiety will decrease Outcome: Progressing   Problem: Pain Managment: Goal: General experience of comfort will improve Outcome: Progressing   Problem: Safety: Goal: Ability to remain free from injury will improve Outcome: Progressing   

## 2018-09-16 NOTE — Care Management Important Message (Signed)
Important Message  Patient Details  Name: Laurie [name] MRN: [id number] Date of Birth: 1955/03/28   Medicare Important Message Given:  Yes     Memory Argue 09/16/2018, 3:45 PM

## 2018-09-17 DIAGNOSIS — M5431 Sciatica, right side: Secondary | ICD-10-CM

## 2018-09-17 LAB — CBC
HCT: 41.4 % (ref 36.0–46.0)
Hemoglobin: 13.9 g/dL (ref 12.0–15.0)
MCH: 29.2 pg (ref 26.0–34.0)
MCHC: 33.6 g/dL (ref 30.0–36.0)
MCV: 87 fL (ref 80.0–100.0)
Platelets: 254 10*3/uL (ref 150–400)
RBC: 4.76 MIL/uL (ref 3.87–5.11)
RDW: 12.9 % (ref 11.5–15.5)
WBC: 10.3 10*3/uL (ref 4.0–10.5)
nRBC: 0 % (ref 0.0–0.2)

## 2018-09-17 LAB — COMPREHENSIVE METABOLIC PANEL
ALT: 81 U/L — ABNORMAL HIGH (ref 0–44)
AST: 36 U/L (ref 15–41)
Albumin: 3.4 g/dL — ABNORMAL LOW (ref 3.5–5.0)
Alkaline Phosphatase: 71 U/L (ref 38–126)
Anion gap: 9 (ref 5–15)
BUN: 8 mg/dL (ref 8–23)
CO2: 23 mmol/L (ref 22–32)
Calcium: 8.9 mg/dL (ref 8.9–10.3)
Chloride: 106 mmol/L (ref 98–111)
Creatinine, Ser: 1.08 mg/dL — ABNORMAL HIGH (ref 0.44–1.00)
GFR calc Af Amer: >60 mL/min (ref >60)
GFR calc non Af Amer: 55 mL/min — ABNORMAL LOW (ref >60)
Glucose, Bld: 123 mg/dL — ABNORMAL HIGH (ref 70–99)
Potassium: 3.5 mmol/L (ref 3.5–5.1)
Sodium: 138 mmol/L (ref 135–145)
Total Bilirubin: 0.5 mg/dL (ref 0.3–1.2)
Total Protein: 6.3 g/dL — ABNORMAL LOW (ref 6.5–8.1)

## 2018-09-17 MED ORDER — GABAPENTIN 100 MG PO CAPS
100.0000 mg | ORAL_CAPSULE | Freq: Three times a day (TID) | ORAL | Status: DC
  Administered 2018-09-17 – 2018-09-18 (×3): 100 mg via ORAL
  Filled 2018-09-17 (×3): qty 1

## 2018-09-17 MED ORDER — ZOLPIDEM TARTRATE 5 MG PO TABS
5.0000 mg | ORAL_TABLET | Freq: Once | ORAL | Status: AC
  Administered 2018-09-17: 5 mg via ORAL
  Filled 2018-09-17: qty 1

## 2018-09-17 MED ORDER — ZOLPIDEM TARTRATE 5 MG PO TABS
5.0000 mg | ORAL_TABLET | Freq: Once | ORAL | Status: AC | PRN
  Administered 2018-09-17: 5 mg via ORAL
  Filled 2018-09-17: qty 1

## 2018-09-17 NOTE — Progress Notes (Addendum)
PROGRESS NOTE    Laurie [name]  ZOX:[id number]  DOB: 06/30/1955  DOA: 09/09/2018 PCP: [name], MD  Brief Narrative:   63 y.o. F with HTN, RA not on DMARD and chronic neck and back pain who underwent neck surgery by Dr. Ellene Route presents with nausea, vomiting, and abdominal pain. In the ED white blood cell count was 16.2 and CMP was unremarkable.  COVID-19 was negative. After several hours of evaluation she tells the ED practitioner that she has been taking fentanyl which is not prescribed for her for her right lower back pain that the radiates down her right leg.  She has a known history of spinal disorders, underwent neck surgery years back and follows Dr. Ellene Route whom she had last seen 12 to 18 months back.She has not been to her PCP for this issue and denies any recent falls or injuries resulting from the pain.  She ran out of fentanyl 2 days prior to admission and patient was concerned that she may be withdrawing from the fentanyl.  Initial plan was to discharge the patient home with clonidine and Zofran with close follow-up with her PCP.  As patient was in the process of being discharged she began to vomit and her emesis was red to brown and positive for blood on Hemoccult.    She was then referred to hospitalist service for admission with GI consultation/evaluation. Patient underwent EGD on 7/31 which showed erosive gastropathy, esophagitis and GI recommended PPI.  CT abdomen was unremarkable.  Abdominal ultrasonogram revealed fatty liver and CBD at 7 mm.  Patient's hospital course has been complicated by poor oral intake due to refractory nausea/vomiting episodes with retching.  She has been on scheduled IV Reglan until this evening.  Subjective:  Patient barely ate dinner today.  Noted bedside tray with regular diet which he ate about 10%.  She however finished Jell-O and had part of the tomato soup.  She states the smell and site of food is making her nauseous.  She also reports  persistent low back pain radiating into her right leg.   Objective: Vitals:   09/16/18 2102 09/17/18 0320 09/17/18 1529 09/17/18 2005  BP: (!) 148/62 (!) 155/65 134/66 (!) 144/73  Pulse: 80 87 76 78  Resp: 18 18 18 17   Temp: 98.1 F (36.7 C) 98.3 F (36.8 C) 98.2 F (36.8 C) 98.2 F (36.8 C)  TempSrc: Oral Oral Oral Oral  SpO2: 95% 97% 99% 93%  Weight:      Height:        Intake/Output Summary (Last 24 hours) at 09/17/2018 2022 Last data filed at 09/17/2018 1330 Gross per 24 hour  Intake 420 ml  Output 600 ml  Net -180 ml   Filed Weights   09/11/18 1243  Weight: 72.6 kg    Physical Examination:  General exam: Appears anxious and mildly tremulous Respiratory system: Clear to auscultation. Respiratory effort normal. Cardiovascular system: S1 & S2 heard, RRR. No JVD, murmurs, rubs, gallops or clicks. No pedal edema. Gastrointestinal system: Abdomen is nondistended, soft and nontender. No organomegaly or masses felt. Normal bowel sounds heard. Central nervous system: Alert and oriented. No focal neurological deficits. Extremities: Symmetric 4x 5 power.  Mild intentional tremors Skin: No rashes, lesions or ulcers Psychiatry: Judgement and insight appear normal. Mood & affect appropriate.     Data Reviewed: I have personally reviewed following labs and imaging studies  CBC: Recent Labs  Lab 09/12/18 0256 09/13/18 8119 09/14/18 0252 09/15/18 0241 09/16/18 0159  09/17/18 0243  WBC 11.8* 11.3* 10.7* 11.1* 11.2* 10.3  NEUTROABS 9.3* 9.3* 8.2* 8.0* 7.8*  --   HGB 13.8 14.7 14.5 14.8 13.9 13.9  HCT 41.0 43.3 42.7 43.4 42.0 41.4  MCV 85.4 84.6 84.7 85.6 86.2 87.0  PLT PLATELET CLUMPS NOTED ON SMEAR, UNABLE TO ESTIMATE 224 232 273 246 740   Basic Metabolic Panel: Recent Labs  Lab 09/12/18 0256 09/13/18 0311 09/14/18 0252 09/15/18 0241 09/16/18 0159 09/17/18 0243  NA 138 139 137 137 138 138  K 3.8 3.2* 3.4* 5.2* 3.7 3.5  CL 104 105 105 106 105 106  CO2 23 23 21*  20* 23 23  GLUCOSE 111* 115* 130* 130* 125* 123*  BUN 15 14 14 12 11 8   CREATININE 0.99 1.10* 1.03* 1.10* 1.16* 1.08*  CALCIUM 8.9 9.2 9.1 9.0 9.1 8.9  MG 1.9 2.0 2.0 2.3 2.2  --   PHOS 3.5 3.1 3.4 3.0 3.4  --    GFR: Estimated Creatinine Clearance: 57.7 mL/min (A) (by C-G formula based on SCr of 1.08 mg/dL (H)). Liver Function Tests: Recent Labs  Lab 09/13/18 0311 09/14/18 0252 09/15/18 0241 09/16/18 0159 09/17/18 0243  AST 30 41 67* 31 36  ALT 41 56* 75* 73* 81*  ALKPHOS 70 72 74 71 71  BILITOT 0.9 1.2 2.6* 0.9 0.5  PROT 7.6 6.9 6.5 6.6 6.3*  ALBUMIN 4.0 3.8 3.6 3.5 3.4*   No results for input(s): LIPASE, AMYLASE in the last 168 hours. No results for input(s): AMMONIA in the last 168 hours. Coagulation Profile: Recent Labs  Lab 09/14/18 0252  INR 1.1   Cardiac Enzymes: No results for input(s): CKTOTAL, CKMB, CKMBINDEX, TROPONINI in the last 168 hours. BNP (last 3 results) No results for input(s): PROBNP in the last 8760 hours. HbA1C: No results for input(s): HGBA1C in the last 72 hours. CBG: No results for input(s): GLUCAP in the last 168 hours. Lipid Profile: No results for input(s): CHOL, HDL, LDLCALC, TRIG, CHOLHDL, LDLDIRECT in the last 72 hours. Thyroid Function Tests: No results for input(s): TSH, T4TOTAL, FREET4, T3FREE, THYROIDAB in the last 72 hours. Anemia Panel: No results for input(s): VITAMINB12, FOLATE, FERRITIN, TIBC, IRON, RETICCTPCT in the last 72 hours. Sepsis Labs: No results for input(s): PROCALCITON, LATICACIDVEN in the last 168 hours.  Recent Results (from the past 240 hour(s))  SARS Coronavirus 2 (CEPHEID- Performed in [place] hospital lab), Hosp Order     Status: None   Collection Time: 09/09/18  8:22 AM   Specimen: Nasopharyngeal Swab  Result Value Ref Range Status   SARS Coronavirus 2 NEGATIVE NEGATIVE Final    Comment: (NOTE) If result is NEGATIVE SARS-CoV-2 target nucleic acids are NOT DETECTED. The SARS-CoV-2 RNA is  generally detectable in upper and lower  respiratory specimens during the acute phase of infection. The lowest  concentration of SARS-CoV-2 viral copies this assay can detect is 250  copies / mL. A negative result does not preclude SARS-CoV-2 infection  and should not be used as the sole basis for treatment or other  patient management decisions.  A negative result may occur with  improper specimen collection / handling, submission of specimen other  than nasopharyngeal swab, presence of viral mutation(s) within the  areas targeted by this assay, and inadequate number of viral copies  (<250 copies / mL). A negative result must be combined with clinical  observations, patient history, and epidemiological information. If result is POSITIVE SARS-CoV-2 target nucleic acids are DETECTED. The SARS-CoV-2 RNA  is generally detectable in upper and lower  respiratory specimens dur ing the acute phase of infection.  Positive  results are indicative of active infection with SARS-CoV-2.  Clinical  correlation with patient history and other diagnostic information is  necessary to determine patient infection status.  Positive results do  not rule out bacterial infection or co-infection with other viruses. If result is PRESUMPTIVE POSTIVE SARS-CoV-2 nucleic acids MAY BE PRESENT.   A presumptive positive result was obtained on the submitted specimen  and confirmed on repeat testing.  While 2019 novel coronavirus  (SARS-CoV-2) nucleic acids may be present in the submitted sample  additional confirmatory testing may be necessary for epidemiological  and / or clinical management purposes  to differentiate between  SARS-CoV-2 and other Sarbecovirus currently known to infect humans.  If clinically indicated additional testing with an alternate test  methodology [phone]) is advised. The SARS-CoV-2 RNA is generally  detectable in upper and lower respiratory sp ecimens during the acute  phase of  infection. The expected result is Negative. Fact Sheet for Patients:  StrictlyIdeas.no Fact Sheet for Healthcare Providers: BankingDealers.co.za This test is not yet approved or cleared by the [place] FDA and has been authorized for detection and/or diagnosis of SARS-CoV-2 by FDA under an Emergency Use Authorization (EUA).  This EUA will remain in effect (meaning this test can be used) for the duration of the COVID-19 declaration under Section 564(b)(1) of the Act, 21 U.S.C. section 360bbb-3(b)(1), unless the authorization is terminated or revoked sooner. Performed at [facility], [place] [address] [place], [place] [address]       Radiology Studies: No results found.      Scheduled Meds:  lidocaine  1 patch Transdermal Q24H   multivitamin with minerals  1 tablet Oral Daily   pantoprazole  40 mg Oral BID   pneumococcal 23 valent vaccine  0.5 mL Intramuscular Tomorrow-1000   scopolamine  1 patch Transdermal Q72H   sodium chloride flush  3 mL Intravenous Q12H   thiamine  100 mg Oral Daily   venlafaxine XR  150 mg Oral Q breakfast   Continuous Infusions:  dextrose 5 % and 0.45% NaCl 75 mL/hr at 09/17/18 1430   ondansetron (ZOFRAN) IV 8 mg (09/17/18 1432)    Assessment & Plan:    1.  Intractable nausea vomiting with hematemesis: Status post EGD on 7/31 that showed small hiatal hernia, moderate esophagitis, erosive gastropathy which was biopsied and patent nonobstructing Schatzki's ring.  Pathology consistent with mild chronic gastritis and reflux changes in esophagus.  No evidence of H. pylori or metaplasia/dysplasia.CTAP with nonobstructing right kidney stone, no findings in the stomach or bowel.Mild elevation LFTs during the hospital course, not present at admission.  Abdominal ultrasound shows fatty liver, 7 mm CBD but no cholecystitis, no cholecystitis.  Per GI recommendations patient was treated with  scopolamine patch/scheduled IV Reglan and Protonix twice daily with limited improvement.  GI has recommended advancing diet as tolerated and signed off.  2.  Low back pain with right-sided sciatica: Patient was taking fentanyl for this.  Her last MRI of the cervical spine was in March which showed surgical plate, significant disc protrusion but no canal stenosis.  Will obtain MRI of the lower back to rule out any significant pathology including infection that could be contributing to dyspepsia.  She had leukocytosis on presentation which peaked to 20 K and felt to be reactive in the setting of problem #1, now resolved.  She had temp  of 100.3 on presentation but now afebrile.  Currently on lidocaine patch but states not helping much.  Will add Neurontin.  3. Opiate withdrawal: Unclear if contributing to problem #1.  Now improved.  Clonidine as needed.  Patient advised regarding risks of opiate dependence and withdrawal.  As mentioned above will obtain MRI to see if there is any significant pathology to explain her symptoms.  Patient known to Dr. Ellene Route.   4.  Hypokalemia: In the setting of problem #1.  Replace as needed.  5.  Depression: Continue venlafaxine.  6.?  History of rheumatoid arthritis: Patient not on any disease modifying agents.   7.  Hypertension: SBP fluctuating between 1 30-1 50 in the setting of pain and problem #3.  Will add low-dose propranolol.  DVT prophylaxis: Lovenox Code Status: Full code Family / Patient Communication: Discussed with patient and all questions answered to satisfaction. Disposition Plan: TBD.  PT evaluation requested.     LOS: 7 days    Time spent: 35 minutes    Guilford Shi, MD [facility] Pager [phone]  If 7PM-7AM, please contact night-coverage [email or web address] Password [email or web address] 09/17/2018, 8:22 PM

## 2018-09-18 ENCOUNTER — Inpatient Hospital Stay (HOSPITAL_COMMUNITY): Payer: Medicare Other

## 2018-09-18 MED ORDER — POLYETHYLENE GLYCOL 3350 17 G PO PACK
17.0000 g | PACK | Freq: Every day | ORAL | Status: DC
  Administered 2018-09-18: 17 g via ORAL
  Filled 2018-09-18: qty 1

## 2018-09-18 MED ORDER — PANTOPRAZOLE SODIUM 40 MG PO TBEC: 40.0000 mg | DELAYED_RELEASE_TABLET | Freq: Two times a day (BID) | ORAL | 1 refills | Status: DC

## 2018-09-18 MED ORDER — AMITRIPTYLINE HCL 10 MG PO TABS: 10.0000 mg | ORAL_TABLET | Freq: Every day | ORAL | 1 refills | Status: DC

## 2018-09-18 MED ORDER — SUCRALFATE 1 G PO TABS
1.0000 g | ORAL_TABLET | Freq: Three times a day (TID) | ORAL | Status: DC
  Administered 2018-09-18: 18:00:00 1 g via ORAL
  Filled 2018-09-18: qty 1

## 2018-09-18 MED ORDER — GABAPENTIN 100 MG PO CAPS: 100.0000 mg | ORAL_CAPSULE | Freq: Three times a day (TID) | ORAL | 0 refills | Status: DC

## 2018-09-18 MED ORDER — GADOBUTROL 1 MMOL/ML IV SOLN
8.0000 mL | Freq: Once | INTRAVENOUS | Status: AC | PRN
  Administered 2018-09-18: 8 mL via INTRAVENOUS

## 2018-09-18 MED ORDER — LIDOCAINE 5 % EX PTCH: 1.0000 | MEDICATED_PATCH | CUTANEOUS | 0 refills | Status: DC

## 2018-09-18 MED ORDER — ONDANSETRON 4 MG PO TBDP: 4.0000 mg | ORAL_TABLET | Freq: Three times a day (TID) | ORAL | 0 refills | Status: DC | PRN

## 2018-09-18 MED ORDER — THIAMINE HCL 100 MG PO TABS: 100.0000 mg | ORAL_TABLET | Freq: Every day | ORAL | 0 refills | Status: AC

## 2018-09-18 MED ORDER — LACTULOSE 10 GM/15ML PO SOLN: 10.0000 g | Freq: Two times a day (BID) | ORAL | Status: DC | PRN

## 2018-09-18 MED ORDER — LACTULOSE 10 GM/15ML PO SOLN: 10.0000 g | Freq: Two times a day (BID) | ORAL | 0 refills | Status: DC | PRN

## 2018-09-18 MED ORDER — SUCRALFATE 1 G PO TABS: 1.0000 g | ORAL_TABLET | Freq: Three times a day (TID) | ORAL | 0 refills | Status: DC

## 2018-09-18 NOTE — Progress Notes (Signed)
OT Cancellation Note  Patient Details Name: MYRKA SYLVA MRN: [id number] DOB: [birth year]   Cancelled Treatment:    Reason Eval/Treat Not Completed: OT screened, no needs identified, will sign off. OT spoke with PT Jaclyn that expressed pt with no acute OT needs at this time. Ot to sign off acutely  Richelle Ito, OTR/L  Acute Rehabilitation Services Pager: [phone] Office: [phone] .  09/18/2018, 10:53 AM

## 2018-09-18 NOTE — Evaluation (Signed)
Physical Therapy Evaluation Patient Details Name: KNIYAH KHUN MRN: [id number] DOB: 1956-01-08 Today's Date: 09/18/2018   History of Present Illness  Pt is a 63 y.o. female admitted 09/09/18 with lower back pain and blood emesis; pt also reports using illicit fentanyl for many months to treat back/neck pain. EGD 7/31 showed erosive gastropathy, small hiatal hernia, moderate esophagitis. Hospital course complicated by poor oral intake due to refractory N/V; suspect opioid withdrawal. Pt also with LBP and R-sided sciatica; awaiting MRI. PMH includes HTN, depression, RA, cervical fusion, lumbar fusion.    Clinical Impression  Pt presents with an overall decrease in functional mobility secondary to above. PTA, pt independent and lives with family; enjoys playing with grandkids. Pt with h/o chronic cervical/lumbar pain s/p previous fusions, but onset of RLE sciatica-like pain is newer. Pt ambulatory with intermittent min guard for balance; suspect majority of instability due to poor PO intake and fatigue. Unsure if BLE weakness more related to this or lumbar spine; recommend follow-up with outpatient physical therapy for conservative treatment. Educ re: energy conservation, activity recommendations, continued ambulation with nursing staff. Will follow acutely to address established goals.    Follow Up Recommendations No PT follow up;Supervision for mobility/OOB    Equipment Recommendations  None recommended by PT    Recommendations for Other Services       Precautions / Restrictions Precautions Precautions: Fall Restrictions Weight Bearing Restrictions: No      Mobility  Bed Mobility Overal bed mobility: Independent                Transfers Overall transfer level: Independent Equipment used: None Transfers: Sit to/from Stand Sit to Stand: Independent            Ambulation/Gait Ambulation/Gait assistance: Supervision;Min guard Gait Distance (Feet): 150 Feet Assistive  device: IV Pole;None Gait Pattern/deviations: Step-through pattern;Decreased stride length;Trunk flexed;Staggering right;Staggering left Gait velocity: Decreased Gait velocity interpretation: <1.8 ft/sec, indicate of risk for recurrent falls General Gait Details: Slow, guarded gait with intermittent min guard for balance as pt feeling weak and unsteady  Stairs            Wheelchair Mobility    Modified Rankin (Stroke Patients Only)       Balance Overall balance assessment: Needs assistance   Sitting balance-Leahy Scale: Good       Standing balance-Leahy Scale: Fair                               Pertinent Vitals/Pain Pain Assessment: Faces Faces Pain Scale: Hurts a little bit Pain Location: Abdomen (pressure needing to pee but can't, RN aware) Pain Descriptors / Indicators: Discomfort Pain Intervention(s): Monitored during session    Home Living Family/patient expects to be discharged to:: Private residence Living Arrangements: Spouse/significant other Available Help at Discharge: Family;Friend(s);Available 24 hours/day Type of Home: House Home Access: Stairs to enter Entrance Stairs-Rails: None Entrance Stairs-Number of Steps: 4 Home Layout: Two level Home Equipment: None      Prior Function Level of Independence: Independent         Comments: On disability. Enjoys playing with grandkids     Hand Dominance        Extremity/Trunk Assessment   Upper Extremity Assessment Upper Extremity Assessment: Overall WFL for tasks assessed(Grossly 4-5 throughout)    Lower Extremity Assessment Lower Extremity Assessment: RLE deficits/detail;LLE deficits/detail RLE Deficits / Details: Hip flex 4/5, knee flex/ext 4/5, ankle 5/5 LLE Deficits / Details: Hip  flex 4/5, knee flex/ext 4/5, ankle 4/5    Cervical / Trunk Assessment Cervical / Trunk Assessment: (forward head posture)  Communication   Communication: No difficulties  Cognition  Arousal/Alertness: Awake/alert Behavior During Therapy: WFL for tasks assessed/performed Overall Cognitive Status: Within Functional Limits for tasks assessed                                        General Comments      Exercises     Assessment/Plan    PT Assessment Patient needs continued PT services  PT Problem List Decreased strength;Decreased activity tolerance;Decreased balance;Decreased mobility       PT Treatment Interventions DME instruction;Gait training;Stair training;Functional mobility training;Therapeutic activities;Therapeutic exercise;Balance training;Patient/family education    PT Goals (Current goals can be found in the Care Plan section)  Acute Rehab PT Goals Patient Stated Goal: Get strength and energy back PT Goal Formulation: With patient Time For Goal Achievement: 10/02/18 Potential to Achieve Goals: Good    Frequency Min 3X/week   Barriers to discharge        Co-evaluation               AM-PAC PT "6 Clicks" Mobility  Outcome Measure Help needed turning from your back to your side while in a flat bed without using bedrails?: None Help needed moving from lying on your back to sitting on the side of a flat bed without using bedrails?: None Help needed moving to and from a bed to a chair (including a wheelchair)?: None Help needed standing up from a chair using your arms (e.g., wheelchair or bedside chair)?: A Little Help needed to walk in hospital room?: A Little Help needed climbing 3-5 steps with a railing? : A Little 6 Click Score: 21    End of Session Equipment Utilized During Treatment: Gait belt Activity Tolerance: Patient tolerated treatment well;Patient limited by fatigue Patient left: in chair;with call bell/phone within reach Nurse Communication: Mobility status PT Visit Diagnosis: Other abnormalities of gait and mobility (R26.89);Muscle weakness (generalized) (M62.81)    Time: 1033-1100 PT Time Calculation  (min) (ACUTE ONLY): 27 min   Charges:   PT Evaluation $PT Eval Moderate Complexity: 1 Mod PT Treatments $Gait Training: 8-22 mins      Mabeline Caras, PT, DPT Acute Rehabilitation Services  Pager [phone] Office [place] 09/18/2018, 12:40 PM

## 2018-09-18 NOTE — Discharge Summary (Signed)
Physician Discharge Summary  Laurie [name] BZJ:[id number] DOB: 12-05-1955 DOA: 09/09/2018  PCP: [name], MD  Admit date: 09/09/2018 Discharge date: 09/18/2018 Consultations: Gastroenterology Admitted From: home Disposition: home  Discharge Diagnoses:  Principal Problem:   Hematemesis Active Problems:   Depression, recurrent ([place])   LOW BACK PAIN   Reflux esophagitis   Polysubstance (excluding opioids) dependence, daily use (HCC)   AKI (acute kidney injury) ([place])   Opiate withdrawal ([place])   Renal insufficiency   Elevated troponin   Leukocytosis   Hospital Course Summary: 63 y.o.Fwith HTN, RA not on DMARD and chronic neck and back pain who underwent neck surgery by Dr. Ellene Route presents with nausea, vomiting, and abdominal pain.In the ED white blood cell count was 16.2 and CMP was unremarkable. COVID-19 was negative. After several hours of evaluation she tells the ED practitioner that she has been taking fentanyl which is not prescribed for her for her right lower back pain that the radiates down her right leg. She has a known history of spinal disorders, underwent neck surgery years back and follows Dr. Ellene Route whom she had last seen 12 to 18 months back.She has not been to her PCP for this issue and denies any recent falls or injuries resulting from the pain. She ran out of fentanyl 2 days prior to admission and patient was concerned thatshe may be withdrawing from the fentanyl. Initial plan was to discharge the patient home with clonidine and Zofran with close follow-up with her PCP. As patient was in the process of being discharged she began to vomit and her emesis was red to brown and positive for blood on Hemoccult.   She was then referred to hospitalist service for admission with GI consultation/evaluation. Patient underwent EGD on 7/31 which showed erosive gastropathy, esophagitis and GI recommended PPI.  CT abdomen was unremarkable.  Abdominal ultrasonogram revealed  fatty liver and CBD at 7 mm.  Patient's hospital course has been complicated by poor oral intake due to refractory nausea/vomiting episodes with retching.  She recieved  scheduled IV Reglan while hospitalized per GI recommendations until 8/6 and then discontinued.   1.  Intractable nausea vomiting with hematemesis: Status post EGD on 7/31 that showed small hiatal hernia, moderate esophagitis, erosive gastropathy which was biopsied and patent nonobstructing Schatzki's ring.  Pathology consistent with mild chronic gastritis and reflux changes in esophagus.  No evidence of H. pylori or metaplasia/dysplasia.CTAP with nonobstructing right kidney stone, no findings in the stomach or bowel.Mild elevation LFTs during the hospital course,not present at admission.  Abdominal ultrasound shows fatty liver, 7 mm CBD but no cholecystitis, no cholecystitis.  Per GI recommendations patient was treated with scopolamine patch/scheduled IV Reglan and Protonix twice daily with some improvement.  GI has recommended advancing diet as tolerated, discharge on antiemetics/PPI and signed off. I have added Carafate to her regimen and she is tolerating diet better. Will prescribe for 10 days.    2.  Low back pain with right-sided sciatica: Patient was taking fentanyl for this.  Her last MRI of the cervical spine was in March which showed surgical plate, significant disc protrusion but no canal stenosis. Obtained MRI of the lumbar spine given c/o back pain/sciatica which showed DDD but no significant change since 2016 and no significant nerve impingement. Continue lidocaine patch but states not helping much. Added Neurontin and recommend outpatient f/u with primary neurosurgeon.Patient known to Dr. Ellene Route. Patient advised to f/u PCP regarding chronic pain and can possibly enter opiate contract prior to  prescribing Tramadol if pain felt to be genuine and unrelieved by Neurontin.   3. Opiate withdrawal: Unclear if contributing to  problem #1.  Now improved.  Clonidine as needed.  Patient advised regarding risks of opiate dependence and withdrawal.  As mentioned above will obtain MRI did not show any new pathology.     4.  Hypokalemia: In the setting of problem #1.  Replace as needed.  5.  Depression: Continue venlafaxine/Elavil/hydroxuzine prn anxiety. Now on Neurontin for #2.  6.?  History of rheumatoid arthritis: Patient not on any disease modifying agents. States was worked for this condition in past. Was also diagnosed with fibromyalgia apparently and takes Elavil.   7. Elevated BP: SBP fluctuated between 1 30-1 50 in the setting of pain and problem #3.  Now normalized. F/U PCP  Discharge Exam:  Vitals:   09/18/18 0547 09/18/18 1546  BP: 128/78 121/74  Pulse: 75 90  Resp: 17   Temp: 98 F (36.7 C) (!) 97.5 F (36.4 C)  SpO2: 99% 99%   Vitals:   09/17/18 1529 09/17/18 2005 09/18/18 0547 09/18/18 1546  BP: 134/66 (!) 144/73 128/78 121/74  Pulse: 76 78 75 90  Resp: 18 17 17    Temp: 98.2 F (36.8 C) 98.2 F (36.8 C) 98 F (36.7 C) (!) 97.5 F (36.4 C)  TempSrc: Oral Oral Oral Oral  SpO2: 99% 93% 99% 99%  Weight:      Height:        General: Pt is alert, awake, not in acute distress Cardiovascular: RRR, S1/S2 +, no rubs, no gallops Respiratory: CTA bilaterally, no wheezing, no rhonchi Abdominal: Soft, NT, ND, bowel sounds + Extremities: no edema, no cyanosis  Discharge Condition:Stable CODE STATUS: Full code Diet recommendation: avoid gastric irritants, small portions frequent meals Recommendations for Outpatient Follow-up:  1. Follow up with PCP: 1 week  2. Follow up with consultants: Primary Neurosurgeon  3. Please obtain follow up labs including: CBC/BMP     Discharge Instructions:  Discharge Instructions    Call MD for:  difficulty breathing, headache or visual disturbances   Complete by: As directed    Call MD for:  persistant dizziness or light-headedness   Complete by: As  directed    Call MD for:  persistant nausea and vomiting   Complete by: As directed    Call MD for:  severe uncontrolled pain   Complete by: As directed    Diet - low sodium heart healthy   Complete by: As directed    Increase activity slowly   Complete by: As directed      Allergies as of 09/18/2018      Reactions   Codeine    REACTION: Vomiting   Lisinopril Cough   Sulfonamide Derivatives    REACTION: Upset GI   Tetracyclines & Related Rash      Medication List    STOP taking these medications   DULoxetine 60 MG capsule Commonly known as: Cymbalta   predniSONE 10 MG tablet Commonly known as: DELTASONE     TAKE these medications   albuterol 108 (90 Base) MCG/ACT inhaler Commonly known as: VENTOLIN HFA Inhale 2 puffs into the lungs every 6 (six) hours as needed for wheezing or shortness of breath.   amitriptyline 10 MG tablet Commonly known as: ELAVIL Take 1 tablet (10 mg total) by mouth at bedtime.   cloNIDine 0.1 MG tablet Commonly known as: CATAPRES Take 1 tablet (0.1 mg total) by mouth 4 (four) times daily as  needed for up to 8 days.   gabapentin 100 MG capsule Commonly known as: NEURONTIN Take 1 capsule (100 mg total) by mouth 3 (three) times daily.   hydrOXYzine 25 MG capsule Commonly known as: Vistaril Take 1 capsule (25 mg total) by mouth 3 (three) times daily as needed.   lactulose 10 GM/15ML solution Commonly known as: CHRONULAC Take 15 mLs (10 g total) by mouth 2 (two) times daily as needed for moderate constipation or severe constipation.   lidocaine 5 % Commonly known as: LIDODERM Place 1 patch onto the skin daily. Remove & Discard patch within 12 hours or as directed by MD Start taking on: September 19, 2018   ondansetron 4 MG disintegrating tablet Commonly known as: Zofran ODT Take 1 tablet (4 mg total) by mouth every 8 (eight) hours as needed for nausea or vomiting.   pantoprazole 40 MG tablet Commonly known as: PROTONIX Take 1 tablet (40  mg total) by mouth 2 (two) times daily.   polyethylene glycol 17 g packet Commonly known as: MIRALAX / GLYCOLAX Take 17 g by mouth daily as needed for mild constipation.   sucralfate 1 g tablet Commonly known as: CARAFATE Take 1 tablet (1 g total) by mouth 4 (four) times daily -  with meals and at bedtime for 10 days.   thiamine 100 MG tablet Take 1 tablet (100 mg total) by mouth daily. Start taking on: September 19, 2018   venlafaxine XR 150 MG 24 hr capsule Commonly known as: EFFEXOR-XR Take 150 mg by mouth daily with breakfast.   Vitamin D3 1.25 MG (50000 UT) Caps Take 1 capsule by mouth once a week.       Allergies  Allergen Reactions  . Codeine     REACTION: Vomiting  . Lisinopril Cough  . Sulfonamide Derivatives     REACTION: Upset GI  . Tetracyclines & Related Rash      The results of significant diagnostics from this hospitalization (including imaging, microbiology, ancillary and laboratory) are listed below for reference.    Labs: BNP (last 3 results) Recent Labs    11/05/17 1230  BNP 932.3*   Basic Metabolic Panel: Recent Labs  Lab 09/12/18 0256 09/13/18 0311 09/14/18 0252 09/15/18 0241 09/16/18 0159 09/17/18 0243  NA 138 139 137 137 138 138  K 3.8 3.2* 3.4* 5.2* 3.7 3.5  CL 104 105 105 106 105 106  CO2 23 23 21* 20* 23 23  GLUCOSE 111* 115* 130* 130* 125* 123*  BUN 15 14 14 12 11 8   CREATININE 0.99 1.10* 1.03* 1.10* 1.16* 1.08*  CALCIUM 8.9 9.2 9.1 9.0 9.1 8.9  MG 1.9 2.0 2.0 2.3 2.2  --   PHOS 3.5 3.1 3.4 3.0 3.4  --    Liver Function Tests: Recent Labs  Lab 09/13/18 0311 09/14/18 0252 09/15/18 0241 09/16/18 0159 09/17/18 0243  AST 30 41 67* 31 36  ALT 41 56* 75* 73* 81*  ALKPHOS 70 72 74 71 71  BILITOT 0.9 1.2 2.6* 0.9 0.5  PROT 7.6 6.9 6.5 6.6 6.3*  ALBUMIN 4.0 3.8 3.6 3.5 3.4*   No results for input(s): LIPASE, AMYLASE in the last 168 hours. No results for input(s): AMMONIA in the last 168 hours. CBC: Recent Labs  Lab  09/12/18 0256 09/13/18 0311 09/14/18 0252 09/15/18 0241 09/16/18 0159 09/17/18 0243  WBC 11.8* 11.3* 10.7* 11.1* 11.2* 10.3  NEUTROABS 9.3* 9.3* 8.2* 8.0* 7.8*  --   HGB 13.8 14.7 14.5 14.8 13.9 13.9  HCT 41.0 43.3 42.7 43.4 42.0 41.4  MCV 85.4 84.6 84.7 85.6 86.2 87.0  PLT PLATELET CLUMPS NOTED ON SMEAR, UNABLE TO ESTIMATE 224 232 273 246 254   Cardiac Enzymes: No results for input(s): CKTOTAL, CKMB, CKMBINDEX, TROPONINI in the last 168 hours. BNP: Invalid input(s): POCBNP CBG: No results for input(s): GLUCAP in the last 168 hours. D-Dimer No results for input(s): DDIMER in the last 72 hours. Hgb A1c No results for input(s): HGBA1C in the last 72 hours. Lipid Profile No results for input(s): CHOL, HDL, LDLCALC, TRIG, CHOLHDL, LDLDIRECT in the last 72 hours. Thyroid function studies No results for input(s): TSH, T4TOTAL, T3FREE, THYROIDAB in the last 72 hours.  Invalid input(s): FREET3 Anemia work up No results for input(s): VITAMINB12, FOLATE, FERRITIN, TIBC, IRON, RETICCTPCT in the last 72 hours. Urinalysis    Component Value Date/Time   COLORURINE YELLOW 09/13/2018 [place] 09/13/2018 1514   LABSPEC 1.011 09/13/2018 1514   PHURINE 6.0 09/13/2018 1514   GLUCOSEU NEGATIVE 09/13/2018 1514   GLUCOSEU NEGATIVE 11/04/2011 1127   HGBUR SMALL (A) 09/13/2018 1514   HGBUR negative 01/01/2010 1055   BILIRUBINUR NEGATIVE 09/13/2018 1514   BILIRUBINUR neg 11/14/2017 0859   KETONESUR 5 (A) 09/13/2018 1514   PROTEINUR 30 (A) 09/13/2018 1514   UROBILINOGEN 0.2 11/14/2017 0859   UROBILINOGEN 0.2 11/04/2011 1127   NITRITE NEGATIVE 09/13/2018 1514   LEUKOCYTESUR NEGATIVE 09/13/2018 1514   Sepsis Labs Invalid input(s): PROCALCITONIN,  WBC,  LACTICIDVEN Microbiology Recent Results (from the past 240 hour(s))  SARS Coronavirus 2 (CEPHEID- Performed in [place] hospital lab), Hosp Order     Status: None   Collection Time: 09/09/18  8:22 AM   Specimen:  Nasopharyngeal Swab  Result Value Ref Range Status   SARS Coronavirus 2 NEGATIVE NEGATIVE Final    Comment: (NOTE) If result is NEGATIVE SARS-CoV-2 target nucleic acids are NOT DETECTED. The SARS-CoV-2 RNA is generally detectable in upper and lower  respiratory specimens during the acute phase of infection. The lowest  concentration of SARS-CoV-2 viral copies this assay can detect is 250  copies / mL. A negative result does not preclude SARS-CoV-2 infection  and should not be used as the sole basis for treatment or other  patient management decisions.  A negative result may occur with  improper specimen collection / handling, submission of specimen other  than nasopharyngeal swab, presence of viral mutation(s) within the  areas targeted by this assay, and inadequate number of viral copies  (<250 copies / mL). A negative result must be combined with clinical  observations, patient history, and epidemiological information. If result is POSITIVE SARS-CoV-2 target nucleic acids are DETECTED. The SARS-CoV-2 RNA is generally detectable in upper and lower  respiratory specimens dur ing the acute phase of infection.  Positive  results are indicative of active infection with SARS-CoV-2.  Clinical  correlation with patient history and other diagnostic information is  necessary to determine patient infection status.  Positive results do  not rule out bacterial infection or co-infection with other viruses. If result is PRESUMPTIVE POSTIVE SARS-CoV-2 nucleic acids MAY BE PRESENT.   A presumptive positive result was obtained on the submitted specimen  and confirmed on repeat testing.  While 2019 novel coronavirus  (SARS-CoV-2) nucleic acids may be present in the submitted sample  additional confirmatory testing may be necessary for epidemiological  and / or clinical management purposes  to differentiate between  SARS-CoV-2 and other Sarbecovirus currently known to infect humans.  If clinically  indicated additional testing with an alternate test  methodology [phone]) is advised. The SARS-CoV-2 RNA is generally  detectable in upper and lower respiratory sp ecimens during the acute  phase of infection. The expected result is Negative. Fact Sheet for Patients:  StrictlyIdeas.no Fact Sheet for Healthcare Providers: BankingDealers.co.za This test is not yet approved or cleared by the [place] FDA and has been authorized for detection and/or diagnosis of SARS-CoV-2 by FDA under an Emergency Use Authorization (EUA).  This EUA will remain in effect (meaning this test can be used) for the duration of the COVID-19 declaration under Section 564(b)(1) of the Act, 21 U.S.C. section 360bbb-3(b)(1), unless the authorization is terminated or revoked sooner. Performed at [facility], [place] [address] [place], [place] [address]     Procedures/Studies: Mr Lumbar Spine W Wo Contrast  Result Date: 09/18/2018 CLINICAL DATA:  Back pain with infection suspected EXAM: MRI LUMBAR SPINE WITHOUT AND WITH CONTRAST TECHNIQUE: Multiplanar and multiecho pulse sequences of the lumbar spine were obtained without and with intravenous contrast. CONTRAST:  8 cc Gadavist intravenous COMPARISON:  10/23/2014 FINDINGS: Segmentation:  Standard lumbar numbering Alignment:  Mild retrolisthesis at L5-S1 Vertebrae: Chronic marrow conversion around anterior osteophyte at T11-12. No fracture, discitis, or aggressive bone lesion. Conus medullaris and cauda equina: Conus extends to the L1 level. Conus and cauda equina appear normal. Paraspinal and other soft tissues: Negative. Disc levels: T12- L1: Unremarkable. L1-L2: Unremarkable. L2-L3: Minor annulus bulging. L3-L4: Disc narrowing and bulging. Central disc annular fissure. No impingement or change L4-L5: Mild disc narrowing with right eccentric bulging. Prior left laminotomy. No impingement L5-S1:Moderate disc narrowing  with bulging and a small central protrusion. Negative facets. No impingement IMPRESSION: 1. No evidence of spinal infection. 2. Lower lumbar disc degeneration without impingement or progression from 2016. Electronically Signed   By: Monte Fantasia M.D.   On: 09/18/2018 10:04   Ct Abdomen Pelvis W Contrast  Result Date: 09/09/2018 CLINICAL DATA:  Epigastric pain EXAM: CT ABDOMEN AND PELVIS WITH CONTRAST TECHNIQUE: Multidetector CT imaging of the abdomen and pelvis was performed using the standard protocol following bolus administration of intravenous contrast. CONTRAST:  167mL OMNIPAQUE IOHEXOL 350 MG/ML SOLN COMPARISON:  September 03, 2013 FINDINGS: Lower chest: Visualized lung bases are clear. Hepatobiliary: A small portion of the dome of the liver is not visualized on this study. There is mild hepatic steatosis near the fissure for the ligamentum teres. There is a 4 mm probable cyst in the anterior segment right lobe of the liver. No other focal liver lesions are appreciable. Gallbladder wall is not appreciably thickened. There is no biliary duct dilatation. Pancreas: There is no pancreatic mass or inflammatory focus. Spleen: No splenic lesions are evident. Adrenals/Urinary Tract: Adrenals bilaterally appear normal. Kidneys bilaterally show no evident mass or hydronephrosis on either side. There is a 1 mm calculus in the upper pole of the right kidney. There is no evident ureteral calculus on either side. Urinary bladder is midline with wall thickness within normal limits. Stomach/Bowel: There is no appreciable bowel wall or mesenteric thickening. There is no evident bowel obstruction. Terminal ileum appears unremarkable. There is no evident free air or portal venous air. Vascular/Lymphatic: There is aortic and iliac artery atherosclerosis. No evident aneurysm. No evident adenopathy in the abdomen or pelvis. Reproductive: Uterus is absent.  There is no evident pelvic mass. Other: The appendix is absent. There is  no periappendiceal region inflammatory change. There is no abscess or ascites in  the abdomen or pelvis. Musculoskeletal: There are foci of degenerative change in the lower thoracic and lumbar regions. There are no blastic or lytic bone lesions. There is no intramuscular or abdominal wall lesion. IMPRESSION: 1. 1 mm calculus upper pole right kidney. No hydronephrosis or ureteral calculus on either side. Urinary bladder wall thickness within normal limits. 2. No demonstrable bowel obstruction. No abscess in the abdomen or pelvis. Appendix absent. No periappendiceal region inflammation. 3.  Aortic and iliac artery atherosclerosis. 4.  Uterus absent. Electronically Signed   By: Lowella Grip III M.D.   On: 09/09/2018 11:34   Dg Chest Port 1 View  Result Date: 09/09/2018 CLINICAL DATA:  Cough and fever.  Chest pain beginning yesterday. EXAM: PORTABLE CHEST 1 VIEW COMPARISON:  One-view chest x-ray 11/05/2017. FINDINGS: Heart size is mildly enlarged. Atherosclerotic changes are noted at the aortic arch. Chronic changes of COPD are present. Chronic interstitial coarsening is similar the prior studies. No superimposed disease is present. There is no edema or effusion. The visualized soft tissues and bony thorax are unremarkable. IMPRESSION: 1. No acute pulmonary disease or significant interval change. 2. Stable chronic changes of COPD. Electronically Signed   By: San Morelle M.D.   On: 09/09/2018 07:57   Vas [place] Lower Extremity Venous (dvt)  Result Date: 09/14/2018  Lower Venous Study Indications: Sciatic pain.  Comparison Study: No prior study on file for comparison. Performing Technologist: Sharion Dove RVS  Examination Guidelines: A complete evaluation includes B-mode imaging, spectral Doppler, color Doppler, and power Doppler as needed of all accessible portions of each vessel. Bilateral testing is considered an integral part of a complete examination. Limited examinations for reoccurring indications  may be performed as noted.  +---------+---------------+---------+-----------+----------+-------+ RIGHT    CompressibilityPhasicitySpontaneityPropertiesSummary +---------+---------------+---------+-----------+----------+-------+ CFV      Full           Yes      Yes                          +---------+---------------+---------+-----------+----------+-------+ SFJ      Full                                                 +---------+---------------+---------+-----------+----------+-------+ FV Prox  Full                                                 +---------+---------------+---------+-----------+----------+-------+ FV Mid   Full                                                 +---------+---------------+---------+-----------+----------+-------+ FV DistalFull                                                 +---------+---------------+---------+-----------+----------+-------+ PFV      Full                                                 +---------+---------------+---------+-----------+----------+-------+  POP      Full           Yes      Yes                          +---------+---------------+---------+-----------+----------+-------+ PTV      Full                                                 +---------+---------------+---------+-----------+----------+-------+ PERO     Full                                                 +---------+---------------+---------+-----------+----------+-------+   +----+---------------+---------+-----------+----------+-------+ LEFTCompressibilityPhasicitySpontaneityPropertiesSummary +----+---------------+---------+-----------+----------+-------+ CFV Full           Yes      Yes                          +----+---------------+---------+-----------+----------+-------+     Summary: Right: There is no evidence of deep vein thrombosis in the lower extremity. Left: No evidence of common femoral vein obstruction.  *See  table(s) above for measurements and observations. Electronically signed by Ruta Hinds MD on 09/14/2018 at 4:04:29 PM.    Final    US Abdomen Limited Ruq  Result Date: 09/15/2018 CLINICAL DATA:  Abnormal LFTs EXAM: ULTRASOUND ABDOMEN LIMITED RIGHT UPPER QUADRANT COMPARISON:  CT abdomen September 09, 2018. FINDINGS: Gallbladder: No gallstones or wall thickening visualized. No sonographic Murphy sign noted by sonographer. Common bile duct: Diameter: 7 mm Liver: Increased echotexture seen throughout. No focal abnormality or biliary ductal dilatation. Portal vein is patent on color Doppler imaging with normal direction of blood flow towards the liver. Other: None. IMPRESSION: 1. Hepatic steatosis 2. Mildly dilated common bile duct without cholecystitis or choledocholithiasis. This is nonspecific, however unchanged since prior CT. Electronically Signed   By: Prudencio Pair M.D.   On: 09/15/2018 16:05     Time coordinating discharge: Over 30 minutes  SIGNED:   Guilford Shi, MD  [facility] 09/18/2018, 5:55 PM Pager : [phone]

## 2018-09-18 NOTE — Progress Notes (Signed)
Patient discharged to home. Verbalized understanding of all discharge instructions including discharge medications and follow up MD visits. 

## 2018-09-21 ENCOUNTER — Encounter: Payer: Self-pay | Admitting: Gastroenterology

## 2018-09-23 ENCOUNTER — Telehealth (INDEPENDENT_AMBULATORY_CARE_PROVIDER_SITE_OTHER): Payer: Medicare Other | Admitting: Family Medicine

## 2018-09-23 ENCOUNTER — Other Ambulatory Visit: Payer: Self-pay | Admitting: Family Medicine

## 2018-09-23 ENCOUNTER — Other Ambulatory Visit: Payer: Self-pay

## 2018-09-23 DIAGNOSIS — F1920 Other psychoactive substance dependence, uncomplicated: Secondary | ICD-10-CM | POA: Diagnosis not present

## 2018-09-23 DIAGNOSIS — I10 Essential (primary) hypertension: Secondary | ICD-10-CM

## 2018-09-23 DIAGNOSIS — F339 Major depressive disorder, recurrent, unspecified: Secondary | ICD-10-CM

## 2018-09-23 DIAGNOSIS — F1193 Opioid use, unspecified with withdrawal: Secondary | ICD-10-CM

## 2018-09-23 DIAGNOSIS — K2100 Gastro-esophageal reflux disease with esophagitis, without bleeding: Secondary | ICD-10-CM

## 2018-09-23 DIAGNOSIS — F1123 Opioid dependence with withdrawal: Secondary | ICD-10-CM

## 2018-09-23 MED ORDER — VENLAFAXINE HCL ER 150 MG PO CP24: 150.0000 mg | ORAL_CAPSULE | Freq: Every day | ORAL | 3 refills | Status: DC

## 2018-09-23 NOTE — Telephone Encounter (Signed)
Per last OV, patient was to start on Cymbalta. I do not see Cymbalta listed on med list. Effexor-xr shows last filled by historical provider.  OK to fill this Rx or do I need to confirm what she is taking?

## 2018-09-23 NOTE — Telephone Encounter (Signed)
She needs hospital follow up.  We can confirm then.

## 2018-09-23 NOTE — Progress Notes (Signed)
This visit type was conducted due to national recommendations for restrictions regarding the COVID-19 pandemic in an effort to limit this patient's exposure and mitigate transmission in our community.   Virtual Visit via Telephone Note  I connected with [place] on 09/23/18 at  4:45 PM EDT by telephone and verified that I am speaking with the correct person using two identifiers.   I discussed the limitations, risks, security and privacy concerns of performing an evaluation and management service by telephone and the availability of in person appointments. I also discussed with the patient that there may be a patient responsible charge related to this service. The patient expressed understanding and agreed to proceed.  Location patient: home Location provider: work or home office Participants present for the call: patient, provider Patient did not have a visit in the prior 7 days to address this/these issue(s).   History of Present Illness: Follow-up encounter for recent hospitalization.  She has chronic problems including long history of nicotine abuse, chronic back pain, history of GERD, history of polysubstance abuse, IBS, hyperlipidemia, hypertension, interstitial cystitis  Recent admission on 29 July.  She presented with some nausea, vomiting, abdominal pain.  White blood count 16.2 thousand.  Chemistries unremarkable.  COVID-19 screen negative.  She confessed that she had been taking some nonprescribed fentanyl for her back pain.  She been having some recent right radiculitis pain.  Ran out of her fentanyl 2 days prior to admission and was concerned about possible withdrawal.  They were planning to discharge her from the ED when she began to vomit and had some emesis that was red to brown and positive for blood.  She was then admitted for further evaluation.  EGD on 7/31 which showed erosive gastropathy and esophagitis.  She was placed on Carafate, initial Reglan, and Protonix twice  daily.  CT abdomen unremarkable abdominal ultrasound showed fatty liver.  She has not had any vomiting since leaving the hospital and appetite is slowly improving.  She did have some dysphagia initially but that is improving as well.  Chronic low back pain with some recent right-sided sciatica symptoms.  She had repeat MRI which did not show any acute significant changes.  Addition of gabapentin 100 mg 3 times daily which she just got prescription filled for today.  There was discussion of possible tramadol use if not controlled with Neurontin  Patient did have evidence for opiate withdrawal and was sent home with prescription for clonidine but never got this filled.  She feels stable at this time.  She had recent mild hypokalemia but is eating better at this time.  She has history of recurrent depression and has been on Effexor for quite some time and requesting refills.  Denies any suicidal ideation.  Blood pressure elevated in the past.  She had fluctuating blood pressures in the hospital but stable at the time of discharge.    Observations/Objective: Patient sounds cheerful and well on the phone. I do not appreciate any SOB. Speech and thought processing are grossly intact. Patient reported vitals:  Assessment and Plan:  #1 recent intractable nausea and vomiting with hematemesis.  She had esophagitis and erosive gastropathy changes.  Also past history of Schatzki's ring.  Currently stable on Protonix and Carafate  -Continue Carafate for another couple weeks then can try to discontinue.  Continue Protonix until follow-up -Avoid all nonsteroidals  #2 chronic low back pain with recent right-sided sciatica.  MRI no acute change  -Start gabapentin 100 mg 3 times daily  and reassess in 2 weeks.  Consider further titration then if necessary.  Of avoid opioids with her history of polysubstance abuse -avoid all NSAIDS - could consider trial of Tramadol if not controlled with the above.  #3  recent opiate withdrawal currently stable.  -Patient promises to avoid illicit drugs in the future  #4 history of recurrent depression currently stable on Effexor  -Refilled Effexor XR 150 mg daily  #5 history of elevated blood pressure.  -Monitor at home and be in touch if consistently greater than 347 systolic.  Reassess with office visit in a couple weeks   Follow Up Instructions:  -2-week in office follow-up.  We will plan to obtain follow-up electrolytes then -Reassess pain control at follow-up   [id number] [id number] [id number] 11-20 [id number] 21-30 I did not refer this patient for an OV in the next 24 hours for this/these issue(s).  I discussed the assessment and treatment plan with the patient. The patient was provided an opportunity to ask questions and all were answered. The patient agreed with the plan and demonstrated an understanding of the instructions.   The patient was advised to call back or seek an in-person evaluation if the symptoms worsen or if the condition fails to improve as anticipated.  I provided 35 minutes of non-face-to-face time during this encounter.   [name], MD

## 2018-09-23 NOTE — Telephone Encounter (Signed)
Patient is scheduled at 4:45pm today. Telephone visit

## 2018-09-28 ENCOUNTER — Encounter: Payer: Self-pay | Admitting: Family Medicine

## 2018-09-28 ENCOUNTER — Ambulatory Visit (INDEPENDENT_AMBULATORY_CARE_PROVIDER_SITE_OTHER): Payer: Medicare Other | Admitting: Family Medicine

## 2018-09-28 ENCOUNTER — Other Ambulatory Visit: Payer: Self-pay

## 2018-09-28 VITALS — BP 132/86 | HR 95 | Temp 98.6°F | Wt 163.4 lb

## 2018-09-28 DIAGNOSIS — R531 Weakness: Secondary | ICD-10-CM

## 2018-09-28 DIAGNOSIS — F339 Major depressive disorder, recurrent, unspecified: Secondary | ICD-10-CM

## 2018-09-28 DIAGNOSIS — K2100 Gastro-esophageal reflux disease with esophagitis, without bleeding: Secondary | ICD-10-CM

## 2018-09-28 DIAGNOSIS — I10 Essential (primary) hypertension: Secondary | ICD-10-CM | POA: Diagnosis not present

## 2018-09-28 DIAGNOSIS — I2510 Atherosclerotic heart disease of native coronary artery without angina pectoris: Secondary | ICD-10-CM

## 2018-09-28 DIAGNOSIS — K920 Hematemesis: Secondary | ICD-10-CM

## 2018-09-28 LAB — COMPREHENSIVE METABOLIC PANEL
ALT: 15 U/L (ref 0–35)
AST: 12 U/L (ref 0–37)
Albumin: 4.2 g/dL (ref 3.5–5.2)
Alkaline Phosphatase: 81 U/L (ref 39–117)
BUN: 13 mg/dL (ref 6–23)
CO2: 28 mEq/L (ref 19–32)
Calcium: 9.6 mg/dL (ref 8.4–10.5)
Chloride: 101 mEq/L (ref 96–112)
Creatinine, Ser: 1.01 mg/dL (ref 0.40–1.20)
GFR: 55.21 mL/min — ABNORMAL LOW (ref >60.00)
Glucose, Bld: 103 mg/dL — ABNORMAL HIGH (ref 70–99)
Potassium: 4.7 mEq/L (ref 3.5–5.1)
Sodium: 139 mEq/L (ref 135–145)
Total Bilirubin: 0.3 mg/dL (ref 0.2–1.2)
Total Protein: 7 g/dL (ref 6.0–8.3)

## 2018-09-28 LAB — CBC WITH DIFFERENTIAL/PLATELET
Basophils Absolute: 0.1 10*3/uL (ref 0.0–0.1)
Basophils Relative: 0.6 % (ref 0.0–3.0)
Eosinophils Absolute: 0.3 10*3/uL (ref 0.0–0.7)
Eosinophils Relative: 3.2 % (ref 0.0–5.0)
HCT: 39.5 % (ref 36.0–46.0)
Hemoglobin: 13.2 g/dL (ref 12.0–15.0)
Lymphocytes Relative: 19.6 % (ref 12.0–46.0)
Lymphs Abs: 2 10*3/uL (ref 0.7–4.0)
MCHC: 33.3 g/dL (ref 30.0–36.0)
MCV: 86.8 fl (ref 78.0–100.0)
Monocytes Absolute: 0.5 10*3/uL (ref 0.1–1.0)
Monocytes Relative: 5.1 % (ref 3.0–12.0)
Neutro Abs: 7.2 10*3/uL (ref 1.4–7.7)
Neutrophils Relative %: 71.5 % (ref 43.0–77.0)
Platelets: 335 10*3/uL (ref 150.0–400.0)
RBC: 4.55 Mil/uL (ref 3.87–5.11)
RDW: 13.5 % (ref 11.5–15.5)
WBC: 10 10*3/uL (ref 4.0–10.5)

## 2018-09-28 NOTE — Progress Notes (Signed)
Subjective:     Patient ID: Laurie [name], female   DOB: February 08, 1956, 63 y.o.   MRN: [id number]  HPI Patient recent hospital follow-up.  Refer to prior note for details.  She had opioid withdrawal from using some nonprescribed fentanyl and also recurrent nausea and vomiting with erosive gastropathy and esophagitis.  She is now on Carafate and Protonix.  Her GI symptoms are improved.  No recurrent vomiting.  History of Schatzki ring.  She had some previous dysphagia and that is resolved at this time.  No melena.  No nonsteroidal use.  Her blood pressure is stable.  She had some up-and-down blood pressures during hospitalization.  She states her current back pain is about 5 out of 10.  She has started gabapentin 100 mg 3 times daily.  Tolerating well.  Her back pain is tolerable.  She has not used any other illicit drugs since discharge.  No alcohol use.  Patient had mildly elevated blood sugars during hospitalizations but A1c was 5.4%.  She has history of recurrent depression currently stable on Effexor XR 150 mg daily  Past Medical History:  Diagnosis Date  . Allergy   . Anxiety   . Arthritis    RA  . Asthma   . Chronic back pain   . COPD (chronic obstructive pulmonary disease) ([place])   . Depression   . Esophageal stricture   . GERD (gastroesophageal reflux disease)   . Heart murmur   . Hematemesis 09/10/2018  . Hyperlipidemia   . Hypertension   . IBS (irritable bowel syndrome)   . Interstitial cystitis   . Neuromuscular disorder ([place])    fibromyalgia   Past Surgical History:  Procedure Laterality Date  . ABDOMINAL HYSTERECTOMY    . APPENDECTOMY    . BIOPSY  09/11/2018   Procedure: BIOPSY;  Surgeon: Rush Landmark Telford Nab., MD;  Location: [facility];  Service: Gastroenterology;;  . CERVICAL FUSION    . CERVICAL LAMINECTOMY    . ESOPHAGOGASTRODUODENOSCOPY    . ESOPHAGOGASTRODUODENOSCOPY (EGD) WITH PROPOFOL N/A 09/11/2018   Procedure: ESOPHAGOGASTRODUODENOSCOPY (EGD) WITH  PROPOFOL;  Surgeon: Rush Landmark Telford Nab., MD;  Location: [facility];  Service: Gastroenterology;  Laterality: N/A;  . FINGER SURGERY    . LUMBAR FUSION    . SAVORY DILATION N/A 09/11/2018   Procedure: SAVORY DILATION;  Surgeon: Rush Landmark Telford Nab., MD;  Location: [facility];  Service: Gastroenterology;  Laterality: N/A;  . TONSILLECTOMY      reports that she has been smoking cigarettes. She has been smoking about 0.50 packs per day. She has never used smokeless tobacco. She reports that she does not drink alcohol or use drugs. family history includes Aneurysm in her brother; Cancer in her brother; Coronary artery disease in her father, paternal aunt, and paternal grandmother; Dementia in her mother; Esophageal cancer in her brother; Heart attack in her father; Stomach cancer in her paternal aunt. Allergies  Allergen Reactions  . Codeine     REACTION: Vomiting  . Lisinopril Cough  . Sulfonamide Derivatives     REACTION: Upset GI  . Tetracyclines & Related Rash       Review of Systems  Constitutional: Positive for fatigue. Negative for appetite change, chills, fever and unexpected weight change.  Respiratory: Negative for cough and shortness of breath.   Cardiovascular: Negative for chest pain.  Gastrointestinal: Negative for abdominal pain and blood in stool.  Neurological: Positive for weakness. Negative for dizziness, seizures and headaches.  Psychiatric/Behavioral: Negative for confusion and dysphoric mood.  Objective:   Physical Exam Constitutional:      Appearance: Normal appearance.  Neck:     Musculoskeletal: Neck supple.  Cardiovascular:     Rate and Rhythm: Normal rate and regular rhythm.  Pulmonary:     Effort: Pulmonary effort is normal.     Breath sounds: Normal breath sounds.  Abdominal:     Palpations: Abdomen is soft. There is no mass.     Tenderness: There is no abdominal tenderness. There is no guarding or rebound.     Hernia: No hernia is  present.  Musculoskeletal:     Right lower leg: No edema.     Left lower leg: No edema.  Skin:    Findings: No rash.  Neurological:     General: No focal deficit present.     Mental Status: She is alert and oriented to person, place, and time.     Cranial Nerves: No cranial nerve deficit.  Psychiatric:        Mood and Affect: Mood normal.        Thought Content: Thought content normal.        Assessment:     #1 recent intractable nausea and vomiting with hematemesis with erosive esophagitis and gastropathy changes.  Symptomatically improved on combination therapy with Carafate and Protonix  #2 chronic back pain with recent right lower extremity radiculitis symptoms but with MRI scan showing no clear signs of impingement  #3 recent opiate withdrawal currently stable  #4 history of recurrent depression currently stable on Effexor  #5 generalized weakness  #6 ongoing nicotine use    Plan:     -Recheck CBC and CMP -Long talk about the importance of avoiding any illicit drugs which she is done in the past.  She is aware of risk including sudden death from arrhythmias and other risk such as respiratory suppression. -Continue gabapentin 100 mg 3 times daily and if pain not adequately controlled in 1 week titrate to 200 mg 3 times daily -Continue Carafate for another couple weeks then discontinue.  She will continue Protonix until follow-up -Routine scheduled follow-up in about 3 months and sooner as needed  [name] MD [facility]

## 2018-09-28 NOTE — Patient Instructions (Signed)
Take the Carafate for two more weeks and then discontinue  Would continue with the Protonix.

## 2018-11-10 ENCOUNTER — Ambulatory Visit: Payer: Medicare Other | Admitting: Gastroenterology

## 2018-11-23 ENCOUNTER — Other Ambulatory Visit: Payer: Self-pay

## 2018-11-23 ENCOUNTER — Encounter: Payer: Self-pay | Admitting: Family Medicine

## 2018-11-23 ENCOUNTER — Ambulatory Visit (INDEPENDENT_AMBULATORY_CARE_PROVIDER_SITE_OTHER): Payer: Medicare Other | Admitting: Family Medicine

## 2018-11-23 VITALS — BP 130/70 | HR 83 | Temp 97.6°F | Resp 16 | Ht 70.0 in | Wt 163.2 lb

## 2018-11-23 DIAGNOSIS — K146 Glossodynia: Secondary | ICD-10-CM | POA: Diagnosis not present

## 2018-11-23 DIAGNOSIS — W19XXXA Unspecified fall, initial encounter: Secondary | ICD-10-CM | POA: Diagnosis not present

## 2018-11-23 DIAGNOSIS — M545 Low back pain, unspecified: Secondary | ICD-10-CM

## 2018-11-23 DIAGNOSIS — K5900 Constipation, unspecified: Secondary | ICD-10-CM | POA: Diagnosis not present

## 2018-11-23 DIAGNOSIS — M25551 Pain in right hip: Secondary | ICD-10-CM

## 2018-11-23 LAB — URINALYSIS, ROUTINE W REFLEX MICROSCOPIC
Bilirubin Urine: NEGATIVE
Hgb urine dipstick: NEGATIVE
Ketones, ur: NEGATIVE
Leukocytes,Ua: NEGATIVE
Nitrite: NEGATIVE
Specific Gravity, Urine: 1.015 (ref 1.000–1.030)
Total Protein, Urine: NEGATIVE
Urine Glucose: NEGATIVE
Urobilinogen, UA: 0.2 (ref 0.0–1.0)
pH: 5.5 (ref 5.0–8.0)

## 2018-11-23 LAB — VITAMIN B12: Vitamin B-12: 297 pg/mL (ref 211–911)

## 2018-11-23 MED ORDER — METHYLPREDNISOLONE ACETATE 40 MG/ML IJ SUSP
40.0000 mg | Freq: Once | INTRAMUSCULAR | Status: AC
  Administered 2018-11-23: 12:00:00 40 mg via INTRAMUSCULAR

## 2018-11-23 NOTE — Patient Instructions (Signed)
  Laurie [name] I have seen you today for an acute visit.  A few things to remember from today's visit:   Constipation, unspecified constipation type  Bilateral low back pain, unspecified chronicity, unspecified whether sciatica present - Plan: Ambulatory referral to Neurosurgery, Urinalysis, Routine w reflex microscopic, methylPREDNISolone acetate (DEPO-MEDROL) injection 40 mg  Tongue burning sensation - Plan: Vitamin B12  Right hip pain - Plan: DG Hip Unilat W OR W/O Pelvis 2-3 Views Right  Fall, accidental, initial encounter - Plan: DG Hip Unilat W OR W/O Pelvis 2-3 Views Right  Referral to continue seeing Dr Ellene Route placed. Over-the-counter IcyHot-may help. Today I did not appreciate "blisters" on your tongue, I am checking for B12, which sometimes can cause burning tongue sensation.  For constipation I recommend Benefiber, this is over-the-counter, 1 teaspoon twice daily. Adequate hydration and fiber intake. Continue MiraLAX daily as needed. You can add bisacodyl 5 mg daily as needed if still constipated.  Avoid straining, this could affect your bladder.   Tighten and relax the pelvic muscles intermittently during the day. Once you are familiar with exercise try to hold pelvic muscles contraction for about 8-10 seconds.in the beginning you may not be able to hold contraction for more than a second or 2 but eventually you will be able to hold contraction harder and for longer time. Perform  8-12 exercises 3 times per day and daily for 15-20 weeks. You will need to continue exercises indefinitely to have a lasting effect.   In general please monitor for signs of worsening symptoms and seek immediate medical attention if any concerning.  I hope you get better soon!

## 2018-11-23 NOTE — Progress Notes (Signed)
ACUTE VISIT   HPI:  Chief Complaint  Patient presents with  . Blisters on tongue    started 2 weeks ago  . Back Pain    lower back pain radiating down right leg  . Trouble urinating    started about 2 months ago, off and on, seems like it takes a long time to urinate   PCP is not available at this time. Laurie [name] is a 63 y.o. female with history of chronic pain, fibromyalgia, tobacco use disorder, and depression among some who is here today complaining of "excruciating" right lower back pain radiates to right hip and right thigh. Long history of back pain but has been worse for the past 2 months.  She states that she was on pain management for years, she was on fentanyl. Fentanyl was discontinued and now she is on methadone, which is not helping with the pain.  Pain is interfering with sleep. Aching-like pain, 10/10, states that she wakes up crying in pain.  Pain is on lower back but also having upper right back pain.  He denies associated chills, fever, unusual fatigue, or abnormal weight loss. Negative for lower extremity numbness, tingling, saddle anesthesia, or changes in bowel/urine continence.  She also mentions left hand numbness and weakness, worse in the morning. This problem has been going on for years, she has history of cervical pain with radiculopathy.  Requesting steroid injection,which has helped in the past.  She has not noted any skin rash, local erythema or edema on affected area.  She felt backwards a month ago, went down 2 steps. Denies head trauma. Landed on right shoulder, she was able to get up, and no bruising or changes in joint pain or ROM.  She would like to have X ray and MRI done. She follows with Dr Ellene Route, has not seen him in "a while",she is requesting referral.  She is also complaining about difficulty starting urination and pressure sensation, "sometimes." Sometimes she has to strain some to urinate.  She denies  dysuria, gross hematuria, changes in urinary frequency, or pelvic pain. She reported history of interstitial cystitis.  Also complained about constipation, she is taking MiraLAX daily as needed. Last bowel movement was about 2 days ago. Hard stool, she has to strain. Denies abdominal pain,unusual nausea (hx of GERD),or vomiting.   Tongue burning sensation for about a month. She started feeling burning sensation in the back of her throat before her tongue was affected. She has seen "white spots" in her throat. Denies sore throat, stridor, or dysphagia.  Problem is constant. Exacerbated by food intake.  She has tried mouthwash, one of her friend's Rx, and it seemed to help.  Review of Systems  Constitutional: Negative for activity change and appetite change.  HENT: Negative for facial swelling and sinus pressure.   Gastrointestinal: Negative for blood in stool and rectal pain.  Endocrine: Negative for cold intolerance and heat intolerance.  Genitourinary: Negative for vaginal bleeding and vaginal discharge.  Musculoskeletal: Negative for gait problem and joint swelling.  Skin: Negative for rash and wound.  Allergic/Immunologic: Positive for environmental allergies.  Neurological: Negative for syncope and headaches.  Psychiatric/Behavioral: Negative for confusion and hallucinations. The patient is nervous/anxious.   Rest see pertinent positives and negatives per HPI.   Current Outpatient Medications on File Prior to Visit  Medication Sig Dispense Refill  . albuterol (PROVENTIL HFA;VENTOLIN HFA) 108 (90 Base) MCG/ACT inhaler Inhale 2 puffs into the lungs every 6 (six)  hours as needed for wheezing or shortness of breath. 1 Inhaler 0  . ondansetron (ZOFRAN ODT) 4 MG disintegrating tablet Take 1 tablet (4 mg total) by mouth every 8 (eight) hours as needed for nausea or vomiting. 12 tablet 0  . pantoprazole (PROTONIX) 40 MG tablet Take 1 tablet (40 mg total) by mouth 2 (two) times daily.  30 tablet 1  . polyethylene glycol (MIRALAX / GLYCOLAX) packet Take 17 g by mouth daily as needed for mild constipation. 14 each 0  . venlafaxine XR (EFFEXOR-XR) 150 MG 24 hr capsule Take 1 capsule (150 mg total) by mouth daily with breakfast. 90 capsule 3  . amitriptyline (ELAVIL) 10 MG tablet Take 1 tablet (10 mg total) by mouth at bedtime. (Patient not taking: Reported on 11/23/2018) 30 tablet 1  . Cholecalciferol (VITAMIN D3) 1.25 MG (50000 UT) CAPS Take 1 capsule by mouth once a week. (Patient not taking: Reported on 09/09/2018) 12 capsule 3  . gabapentin (NEURONTIN) 100 MG capsule Take 1 capsule (100 mg total) by mouth 3 (three) times daily. 90 capsule 0  . hydrOXYzine (VISTARIL) 25 MG capsule Take 1 capsule (25 mg total) by mouth 3 (three) times daily as needed. (Patient not taking: Reported on 09/09/2018) 30 capsule 0  . lactulose (CHRONULAC) 10 GM/15ML solution Take 15 mLs (10 g total) by mouth 2 (two) times daily as needed for moderate constipation or severe constipation. (Patient not taking: Reported on 11/23/2018) 236 mL 0  . lidocaine (LIDODERM) 5 % Place 1 patch onto the skin daily. Remove & Discard patch within 12 hours or as directed by MD (Patient not taking: Reported on 09/28/2018) 30 patch 0   No current facility-administered medications on file prior to visit.      Past Medical History:  Diagnosis Date  . Allergy   . Anxiety   . Arthritis    RA  . Asthma   . Chronic back pain   . COPD (chronic obstructive pulmonary disease) ([place])   . Depression   . Esophageal stricture   . GERD (gastroesophageal reflux disease)   . Heart murmur   . Hematemesis 09/10/2018  . Hyperlipidemia   . Hypertension   . IBS (irritable bowel syndrome)   . Interstitial cystitis   . Neuromuscular disorder ([facility])    fibromyalgia   Allergies  Allergen Reactions  . Codeine     REACTION: Vomiting  . Lisinopril Cough  . Sulfonamide Derivatives     REACTION: Upset GI  . Tetracyclines & Related  Rash    Social History   Socioeconomic History  . Marital status: Single    Spouse name: Not on file  . Number of children: Not on file  . Years of education: Not on file  . Highest education level: Not on file  Occupational History  . Not on file  Social Needs  . Financial resource strain: Not on file  . Food insecurity    Worry: Not on file    Inability: Not on file  . Transportation needs    Medical: Not on file    Non-medical: Not on file  Tobacco Use  . Smoking status: Current Every Day Smoker    Packs/day: 0.50    Types: Cigarettes  . Smokeless tobacco: Never Used  . Tobacco comment: trying to quit   Substance and Sexual Activity  . Alcohol use: No    Alcohol/week: 0.0 standard drinks  . Drug use: No  . Sexual activity: Not on file  Lifestyle  .  Physical activity    Days per week: Not on file    Minutes per session: Not on file  . Stress: Not on file  Relationships  . Social Herbalist on phone: Not on file    Gets together: Not on file    Attends religious service: Not on file    Active member of club or organization: Not on file    Attends meetings of clubs or organizations: Not on file    Relationship status: Not on file  Other Topics Concern  . Not on file  Social History Narrative  . Not on file    Vitals:   11/23/18 1059  BP: 130/70  Pulse: 83  Resp: 16  Temp: 97.6 F (36.4 C)  SpO2: 96%   Body mass index is 23.42 kg/m.   Physical Exam  Nursing note and vitals reviewed. Constitutional: She is oriented to person, place, and time. She appears well-developed and well-nourished. No distress.  HENT:  Head: Normocephalic and atraumatic.  Mouth/Throat: Oropharynx is clear and moist and mucous membranes are normal.  No edema or lesions appreciated upon inspection of pharynx and tongue. Tongue is smooth, not enlarged.  Eyes: Pupils are equal, round, and reactive to light. Conjunctivae are normal.  Cardiovascular: Normal rate and  regular rhythm.  Pulses:      Dorsalis pedis pulses are 2+ on the right side.  Respiratory: Effort normal and breath sounds normal. No respiratory distress.  GI: Soft. She exhibits no mass. There is no hepatomegaly. There is no abdominal tenderness.  Musculoskeletal:        General: No edema.     Right hip: She exhibits decreased range of motion and tenderness.     Lumbar back: She exhibits no tenderness and no bony tenderness.     Comments: Right hip pain elicited by hip movement. No significant discrepancy in leg length. Limitation in flexion, internal and external rotation.  No tenderness upon palpation or deformity appreciated.  Knee crepitus bilateral. Right knee mild limitation of extension.  Lymphadenopathy:       Head (right side): No submandibular adenopathy present.       Head (left side): No submandibular adenopathy present.    She has no cervical adenopathy.  Neurological: She is alert and oriented to person, place, and time. She has normal strength. No cranial nerve deficit.  Reflex Scores:      Patellar reflexes are 2+ on the right side and 2+ on the left side. Non antalgic gait. SLR elicits leg pain, right.  Skin: Skin is warm. No rash noted. No erythema.  Psychiatric: Her mood appears anxious.  Well groomed, good eye contact.    ASSESSMENT AND PLAN:  [name] was seen today for blisters on tongue, back pain and trouble urinating.  Diagnoses and all orders for this visit:  Orders Placed This Encounter  Procedures  . DG Hip Unilat W OR W/O Pelvis 2-3 Views Right  . Vitamin B12  . Urinalysis, Routine w reflex microscopic  . Ambulatory referral to Neurosurgery   Lab Results  Component Value Date   VITAMINB12 297 11/23/2018    Bilateral low back pain, unspecified chronicity, unspecified whether sciatica present Chronic problem that seems to be getting worse. I will let Dr Ellene Route to decide if lumbar MRI is needed. As requested by pt, Depo Medrol 40 mg  IM given, which has helped in the past. Topical OTC medications may help. Activity as tolerated. Instructed about warning signs. Referral  placed.  -     Ambulatory referral to Neurosurgery -     Urinalysis, Routine w reflex microscopic -     methylPREDNISolone acetate (DEPO-MEDROL) injection 40 mg  Constipation, unspecified constipation type History of IBS. Continue MiraLAX daily as needed p.o. Adequate fiber and fluid intake. Benefiber 1 teaspoon twice daily may help.  If still having constipation, she can try bisacodyl 5 mg daily as needed. Colonoscopy last done in 08/2013.   In regard to difficulty starting voiding, we discussed possible etiologies. ?  Mild cystocele, IC. Pelvic was not performed. Kegel exercises recommended. Avoid constipation/straining. Further recommendation will be given according to UA results. Follow-up with PCP or gynecologist as needed.  Tongue burning sensation Possible etiology discussed. Smoking cessation encouraged. Further recommendation will be given according to B12 results. OTC mouthwash recommended.  -     Vitamin B12  Right hip pain Because she reported that the pain is worse since fall a month ago, plain x-ray was ordered. ?  Radicular pain, OA.  -     DG Hip Unilat W OR W/O Pelvis 2-3 Views Right; Future  Fall, accidental, initial encounter Fall precautions discussed. Some of meds on med list could increase risk for falls.  -     DG Hip Unilat W OR W/O Pelvis 2-3 Views Right; Future   Return in about 2 weeks (around 12/07/2018) for with PCP.    G. Martinique, MD  [facility]. [place] office.

## 2018-12-02 ENCOUNTER — Encounter: Payer: Self-pay | Admitting: Gastroenterology

## 2019-01-13 ENCOUNTER — Other Ambulatory Visit: Payer: Self-pay | Admitting: Family Medicine

## 2019-01-13 DIAGNOSIS — Z1231 Encounter for screening mammogram for malignant neoplasm of breast: Secondary | ICD-10-CM

## 2019-01-23 ENCOUNTER — Ambulatory Visit: Payer: Medicare Other

## 2019-07-16 ENCOUNTER — Telehealth: Payer: Self-pay | Admitting: Family Medicine

## 2019-07-16 NOTE — Progress Notes (Signed)
  Chronic Care Management   Outreach Note  07/16/2019 Name: Laurie [name] MRN: [id number] DOB: [birth year]  Referred by: [name], MD Reason for referral : No chief complaint on file.   An unsuccessful telephone outreach was attempted today. The patient was referred to the pharmacist for assistance with care management and care coordination.   Follow Up Plan:   Prathima Ghanta Upstream Scheduler

## 2019-07-20 ENCOUNTER — Telehealth (INDEPENDENT_AMBULATORY_CARE_PROVIDER_SITE_OTHER): Payer: Medicare Other | Admitting: Family Medicine

## 2019-07-20 ENCOUNTER — Other Ambulatory Visit: Payer: Self-pay

## 2019-07-20 DIAGNOSIS — R5383 Other fatigue: Secondary | ICD-10-CM

## 2019-07-20 DIAGNOSIS — R531 Weakness: Secondary | ICD-10-CM

## 2019-07-20 DIAGNOSIS — J309 Allergic rhinitis, unspecified: Secondary | ICD-10-CM | POA: Diagnosis not present

## 2019-07-20 DIAGNOSIS — R630 Anorexia: Secondary | ICD-10-CM

## 2019-07-20 NOTE — Progress Notes (Signed)
Patient ID: Laurie [name], female   DOB: 1955/07/13, 64 y.o.   MRN: [id number]  This visit type was conducted due to national recommendations for restrictions regarding the COVID-19 pandemic in an effort to limit this patient's exposure and mitigate transmission in our community.   Virtual Visit via Telephone Note  I connected with Laurie [name] on 07/20/19 at  9:45 AM EDT by telephone and verified that I am speaking with the correct person using two identifiers.   I discussed the limitations, risks, security and privacy concerns of performing an evaluation and management service by telephone and the availability of in person appointments. I also discussed with the patient that there may be a patient responsible charge related to this service. The patient expressed understanding and agreed to proceed.  Location patient: home Location provider: work or home office Participants present for the call: patient, provider Patient did not have a visit in the prior 7 days to address this/these issue(s).   History of Present Illness: Laurie [name] called with nonspecific symptoms of sinus congestion for many weeks, fatigue, decreased appetite, generalized weakness.  She has had some postnasal drip.  She has already had Covid vaccine.  No fever.  She initially had some mild yellowish discharge but not consistently.  She has not tried any allergy medications yet.  She is not aware if her weight is going down any.  She does have history of CAD but denies any recent chest pains.  She is having increased exhaustion with things like climbing stairs.  She also has history of hypertension, IBS, fibromyalgia, history of GERD, chronic back pain.  She is followed through chronic pain management clinic and states she is currently on methadone 45 mg daily and try to titrate down.  She wonders if some of her fatigue is related to this.  Past Medical History:  Diagnosis Date  . Allergy   . Anxiety   . Arthritis    RA  . Asthma   . Chronic back pain   . COPD (chronic obstructive pulmonary disease) ([place])   . Depression   . Esophageal stricture   . GERD (gastroesophageal reflux disease)   . Heart murmur   . Hematemesis 09/10/2018  . Hyperlipidemia   . Hypertension   . IBS (irritable bowel syndrome)   . Interstitial cystitis   . Neuromuscular disorder ([place])    fibromyalgia   Past Surgical History:  Procedure Laterality Date  . ABDOMINAL HYSTERECTOMY    . APPENDECTOMY    . BIOPSY  09/11/2018   Procedure: BIOPSY;  Surgeon: Rush Landmark Telford Nab., MD;  Location: [facility];  Service: Gastroenterology;;  . CERVICAL FUSION    . CERVICAL LAMINECTOMY    . ESOPHAGOGASTRODUODENOSCOPY    . ESOPHAGOGASTRODUODENOSCOPY (EGD) WITH PROPOFOL N/A 09/11/2018   Procedure: ESOPHAGOGASTRODUODENOSCOPY (EGD) WITH PROPOFOL;  Surgeon: Rush Landmark Telford Nab., MD;  Location: [facility];  Service: Gastroenterology;  Laterality: N/A;  . FINGER SURGERY    . LUMBAR FUSION    . SAVORY DILATION N/A 09/11/2018   Procedure: SAVORY DILATION;  Surgeon: Rush Landmark Telford Nab., MD;  Location: [facility];  Service: Gastroenterology;  Laterality: N/A;  . TONSILLECTOMY      reports that she has been smoking cigarettes. She has been smoking about 0.50 packs per day. She has never used smokeless tobacco. She reports that she does not drink alcohol or use drugs. family history includes Aneurysm in her brother; Cancer in her brother; Coronary artery disease in her father, paternal aunt, and paternal grandmother; Dementia  in her mother; Esophageal cancer in her brother; Heart attack in her father; Stomach cancer in her paternal aunt. Allergies  Allergen Reactions  . Codeine     REACTION: Vomiting  . Lisinopril Cough  . Sulfonamide Derivatives     REACTION: Upset GI  . Tetracyclines & Related Rash      Observations/Objective: Patient sounds cheerful and well on the phone. I do not appreciate any SOB. Speech and thought  processing are grossly intact. Patient reported vitals:  Assessment and Plan:  #1 several week history of nasal congestion and postnasal drip.  Question allergic rhinitis  -Recommend trial over-the-counter Zyrtec and Flonase  #2 Constellation of symptoms including fatigue, decreased appetite, generalized weakness.  Etiology unclear from history.  This could be many things.  We recommended an office follow-up for physical exam and probably some lab work to help further assess  Follow Up Instructions:  -Set up 30-minute in office follow-up   [id number] [id number] [id number] [id number] [id number] 21-30 I did not refer this patient for an OV in the next 24 hours for this/these issue(s).  I discussed the assessment and treatment plan with the patient. The patient was provided an opportunity to ask questions and all were answered. The patient agreed with the plan and demonstrated an understanding of the instructions.   The patient was advised to call back or seek an in-person evaluation if the symptoms worsen or if the condition fails to improve as anticipated.  I provided 25 minutes of non-face-to-face time during this encounter.   [name], MD

## 2019-07-22 ENCOUNTER — Other Ambulatory Visit: Payer: Self-pay

## 2019-07-23 ENCOUNTER — Encounter: Payer: Self-pay | Admitting: Family Medicine

## 2019-07-23 ENCOUNTER — Ambulatory Visit (INDEPENDENT_AMBULATORY_CARE_PROVIDER_SITE_OTHER): Payer: Medicare Other | Admitting: Family Medicine

## 2019-07-23 ENCOUNTER — Ambulatory Visit (INDEPENDENT_AMBULATORY_CARE_PROVIDER_SITE_OTHER)
Admission: RE | Admit: 2019-07-23 | Discharge: 2019-07-23 | Disposition: A | Discharge status: Home / Self Care | Payer: Medicare Other | Source: Ambulatory Visit | Attending: Family Medicine | Admitting: Family Medicine

## 2019-07-23 ENCOUNTER — Ambulatory Visit: Payer: Medicare Other | Admitting: Family Medicine

## 2019-07-23 VITALS — BP 122/68 | HR 86 | Temp 97.6°F | Wt 169.0 lb

## 2019-07-23 DIAGNOSIS — R0602 Shortness of breath: Secondary | ICD-10-CM | POA: Diagnosis not present

## 2019-07-23 DIAGNOSIS — I2510 Atherosclerotic heart disease of native coronary artery without angina pectoris: Secondary | ICD-10-CM

## 2019-07-23 DIAGNOSIS — R05 Cough: Secondary | ICD-10-CM

## 2019-07-23 DIAGNOSIS — R5383 Other fatigue: Secondary | ICD-10-CM

## 2019-07-23 DIAGNOSIS — M25551 Pain in right hip: Secondary | ICD-10-CM | POA: Diagnosis not present

## 2019-07-23 DIAGNOSIS — R059 Cough, unspecified: Secondary | ICD-10-CM

## 2019-07-23 DIAGNOSIS — F1910 Other psychoactive substance abuse, uncomplicated: Secondary | ICD-10-CM

## 2019-07-23 DIAGNOSIS — R0609 Other forms of dyspnea: Secondary | ICD-10-CM

## 2019-07-23 DIAGNOSIS — J449 Chronic obstructive pulmonary disease, unspecified: Secondary | ICD-10-CM | POA: Diagnosis not present

## 2019-07-23 DIAGNOSIS — M1611 Unilateral primary osteoarthritis, right hip: Secondary | ICD-10-CM | POA: Diagnosis not present

## 2019-07-23 DIAGNOSIS — R0600 Dyspnea, unspecified: Secondary | ICD-10-CM

## 2019-07-23 LAB — CBC WITH DIFFERENTIAL/PLATELET
Basophils Absolute: 0.1 10*3/uL (ref 0.0–0.1)
Basophils Relative: 0.9 % (ref 0.0–3.0)
Eosinophils Absolute: 0.3 10*3/uL (ref 0.0–0.7)
Eosinophils Relative: 3.2 % (ref 0.0–5.0)
HCT: 40.7 % (ref 36.0–46.0)
Hemoglobin: 13.7 g/dL (ref 12.0–15.0)
Lymphocytes Relative: 27.2 % (ref 12.0–46.0)
Lymphs Abs: 2.7 10*3/uL (ref 0.7–4.0)
MCHC: 33.7 g/dL (ref 30.0–36.0)
MCV: 87.6 fl (ref 78.0–100.0)
Monocytes Absolute: 0.6 10*3/uL (ref 0.1–1.0)
Monocytes Relative: 6.2 % (ref 3.0–12.0)
Neutro Abs: 6.3 10*3/uL (ref 1.4–7.7)
Neutrophils Relative %: 62.5 % (ref 43.0–77.0)
Platelets: 254 10*3/uL (ref 150.0–400.0)
RBC: 4.65 Mil/uL (ref 3.87–5.11)
RDW: 13.6 % (ref 11.5–15.5)
WBC: 10 10*3/uL (ref 4.0–10.5)

## 2019-07-23 LAB — COMPREHENSIVE METABOLIC PANEL
ALT: 13 U/L (ref 0–35)
AST: 11 U/L (ref 0–37)
Albumin: 4.2 g/dL (ref 3.5–5.2)
Alkaline Phosphatase: 76 U/L (ref 39–117)
BUN: 20 mg/dL (ref 6–23)
CO2: 26 mEq/L (ref 19–32)
Calcium: 9.2 mg/dL (ref 8.4–10.5)
Chloride: 103 mEq/L (ref 96–112)
Creatinine, Ser: 0.98 mg/dL (ref 0.40–1.20)
GFR: 57.02 mL/min — ABNORMAL LOW (ref >60.00)
Glucose, Bld: 112 mg/dL — ABNORMAL HIGH (ref 70–99)
Potassium: 3.8 mEq/L (ref 3.5–5.1)
Sodium: 137 mEq/L (ref 135–145)
Total Bilirubin: 0.3 mg/dL (ref 0.2–1.2)
Total Protein: 7 g/dL (ref 6.0–8.3)

## 2019-07-23 LAB — LIPID PANEL
Cholesterol: 195 mg/dL (ref 0–200)
HDL: 35.3 mg/dL — ABNORMAL LOW (ref >39.00)
LDL Cholesterol: 124 mg/dL — ABNORMAL HIGH (ref 0–99)
NonHDL: 159.99
Total CHOL/HDL Ratio: 6
Triglycerides: 180 mg/dL — ABNORMAL HIGH (ref 0.0–149.0)
VLDL: 36 mg/dL (ref 0.0–40.0)

## 2019-07-23 LAB — TSH: TSH: 0.33 u[IU]/mL — ABNORMAL LOW (ref 0.35–4.50)

## 2019-07-23 NOTE — Patient Instructions (Signed)
Go for x-rays over at [facility] today before 5.   I am setting up cardiology referral.

## 2019-07-23 NOTE — Progress Notes (Signed)
Established Patient Office Visit  Subjective:  Patient ID: Laurie [name], female    DOB: 21-Sep-1955  Age: 64 y.o. MRN: [id number]  CC:  Chief Complaint  Patient presents with  . Allergic Rhinitis     pt  is here to follow up on her bad allergies     HPI Laurie [name] presents for follow-up from recent virtual visit on 07/20/2019.  She had called with several week history of some nasal congestion postnasal drip.  We suspect allergic.  She also complains of multiple symptoms including fatigue, decreased appetite, generalized weakness.  We recommended an office follow-up to further assess.  She has multiple chronic problems as below.  She has history of CAD.  She has not seen cardiology in several years.  She complained of some increased fatigue and also intermittent dyspnea especially with activity such as climbing stairs.  No peripheral edema.  No orthopnea.  Does still smoke.  No chest tightness.   She has had some recent cough occasionally productive of brown sputum.  Denies any fevers or chills.  No hemoptysis.  She stated few days ago her appetite was down slightly but her weight is actually up 6 pounds from last visit.  Also complains of progressive right hip pain.  She has some increased stiffness and pain which is somewhat anterior and medial but also posterior.  She does have some chronic intermittent low back pain.  She has history of polysubstance abuse.  She does admit to using some cocaine recently.  She is currently followed in methadone clinic and is on methadone dose of 45 mg daily.  She also relates intermittent numbness and possibly intermittent weakness right hand greater than left.  She has tingling and occasional pain especially first through third digits.  Past Medical History:  Diagnosis Date  . Allergy   . Anxiety   . Arthritis    RA  . Asthma   . Chronic back pain   . COPD (chronic obstructive pulmonary disease) ([place])   . Depression   . Esophageal  stricture   . GERD (gastroesophageal reflux disease)   . Heart murmur   . Hematemesis 09/10/2018  . Hyperlipidemia   . Hypertension   . IBS (irritable bowel syndrome)   . Interstitial cystitis   . Neuromuscular disorder ([place])    fibromyalgia    Past Surgical History:  Procedure Laterality Date  . ABDOMINAL HYSTERECTOMY    . APPENDECTOMY    . BIOPSY  09/11/2018   Procedure: BIOPSY;  Surgeon: Rush Landmark Telford Nab., MD;  Location: [facility];  Service: Gastroenterology;;  . CERVICAL FUSION    . CERVICAL LAMINECTOMY    . ESOPHAGOGASTRODUODENOSCOPY    . ESOPHAGOGASTRODUODENOSCOPY (EGD) WITH PROPOFOL N/A 09/11/2018   Procedure: ESOPHAGOGASTRODUODENOSCOPY (EGD) WITH PROPOFOL;  Surgeon: Rush Landmark Telford Nab., MD;  Location: [facility];  Service: Gastroenterology;  Laterality: N/A;  . FINGER SURGERY    . LUMBAR FUSION    . SAVORY DILATION N/A 09/11/2018   Procedure: SAVORY DILATION;  Surgeon: Rush Landmark Telford Nab., MD;  Location: [facility];  Service: Gastroenterology;  Laterality: N/A;  . TONSILLECTOMY      Family History  Problem Relation Age of Onset  . Dementia Mother   . Heart attack Father   . Coronary artery disease Father   . Cancer Brother        esophageal  . Esophageal cancer Brother   . Coronary artery disease Paternal Aunt   . Stomach cancer Paternal Aunt   .  Coronary artery disease Paternal Grandmother   . Aneurysm Brother        aortic  . Rectal cancer Neg Hx   . Colon cancer Neg Hx     Social History   Socioeconomic History  . Marital status: Single    Spouse name: Not on file  . Number of children: Not on file  . Years of education: Not on file  . Highest education level: Not on file  Occupational History  . Not on file  Tobacco Use  . Smoking status: Current Every Day Smoker    Packs/day: 0.50    Types: Cigarettes  . Smokeless tobacco: Never Used  . Tobacco comment: trying to quit   Vaping Use  . Vaping Use: Never used  Substance and  Sexual Activity  . Alcohol use: No    Alcohol/week: 0.0 standard drinks  . Drug use: No  . Sexual activity: Not on file  Other Topics Concern  . Not on file  Social History Narrative  . Not on file   Social Determinants of Health   Financial Resource Strain:   . Difficulty of Paying Living Expenses:   Food Insecurity:   . Worried About Charity fundraiser in the Last Year:   . Arboriculturist in the Last Year:   Transportation Needs:   . Film/video editor (Medical):   Marland Kitchen Lack of Transportation (Non-Medical):   Physical Activity:   . Days of Exercise per Week:   . Minutes of Exercise per Session:   Stress:   . Feeling of Stress :   Social Connections:   . Frequency of Communication with Friends and Family:   . Frequency of Social Gatherings with Friends and Family:   . Attends Religious Services:   . Active Member of Clubs or Organizations:   . Attends Archivist Meetings:   Marland Kitchen Marital Status:   Intimate Partner Violence:   . Fear of Current or Ex-Partner:   . Emotionally Abused:   Marland Kitchen Physically Abused:   . Sexually Abused:     Outpatient Medications Prior to Visit  Medication Sig Dispense Refill  . albuterol (PROVENTIL HFA;VENTOLIN HFA) 108 (90 Base) MCG/ACT inhaler Inhale 2 puffs into the lungs every 6 (six) hours as needed for wheezing or shortness of breath. 1 Inhaler 0  . ondansetron (ZOFRAN ODT) 4 MG disintegrating tablet Take 1 tablet (4 mg total) by mouth every 8 (eight) hours as needed for nausea or vomiting. 12 tablet 0  . pantoprazole (PROTONIX) 40 MG tablet Take 1 tablet (40 mg total) by mouth 2 (two) times daily. 30 tablet 1  . polyethylene glycol (MIRALAX / GLYCOLAX) packet Take 17 g by mouth daily as needed for mild constipation. 14 each 0  . venlafaxine XR (EFFEXOR-XR) 150 MG 24 hr capsule Take 1 capsule (150 mg total) by mouth daily with breakfast. 90 capsule 3  . amitriptyline (ELAVIL) 10 MG tablet Take 1 tablet (10 mg total) by mouth at  bedtime. (Patient not taking: Reported on 11/23/2018) 30 tablet 1  . Cholecalciferol (VITAMIN D3) 1.25 MG (50000 UT) CAPS Take 1 capsule by mouth once a week. (Patient not taking: Reported on 09/09/2018) 12 capsule 3  . gabapentin (NEURONTIN) 100 MG capsule Take 1 capsule (100 mg total) by mouth 3 (three) times daily. 90 capsule 0  . hydrOXYzine (VISTARIL) 25 MG capsule Take 1 capsule (25 mg total) by mouth 3 (three) times daily as needed. (Patient not taking: Reported on  09/09/2018) 30 capsule 0  . lactulose (CHRONULAC) 10 GM/15ML solution Take 15 mLs (10 g total) by mouth 2 (two) times daily as needed for moderate constipation or severe constipation. (Patient not taking: Reported on 11/23/2018) 236 mL 0  . lidocaine (LIDODERM) 5 % Place 1 patch onto the skin daily. Remove & Discard patch within 12 hours or as directed by MD (Patient not taking: Reported on 09/28/2018) 30 patch 0   No facility-administered medications prior to visit.    Allergies  Allergen Reactions  . Codeine     REACTION: Vomiting  . Lisinopril Cough  . Sulfonamide Derivatives     REACTION: Upset GI  . Tetracyclines & Related Rash    ROS Review of Systems  Constitutional: Positive for appetite change and fatigue. Negative for chills and fever.  Respiratory: Positive for cough and shortness of breath. Negative for wheezing.   Cardiovascular: Negative for chest pain, palpitations and leg swelling.  Gastrointestinal: Negative for abdominal pain.  Genitourinary: Negative for dysuria.  Musculoskeletal: Positive for arthralgias and back pain.  Neurological: Negative for syncope.  Hematological: Negative for adenopathy.      Objective:    Physical Exam Vitals reviewed.  Constitutional:      Appearance: Normal appearance.  Cardiovascular:     Rate and Rhythm: Normal rate and regular rhythm.  Pulmonary:     Effort: Pulmonary effort is normal.     Breath sounds: Normal breath sounds.  Abdominal:     Palpations:  Abdomen is soft.     Tenderness: There is no abdominal tenderness.  Musculoskeletal:     Cervical back: Neck supple.     Right lower leg: No edema.     Left lower leg: No edema.     Comments: She has reduced range of motion right hip compared to left.  Also has some pain with extreme internal and external rotation of the right hip  Lymphadenopathy:     Cervical: No cervical adenopathy.  Neurological:     General: No focal deficit present.     Mental Status: She is alert.     Cranial Nerves: No cranial nerve deficit.     BP 122/68 (BP Location: Left Arm, Patient Position: Sitting, Cuff Size: Normal)   Pulse 86   Temp 97.6 F (36.4 C) (Temporal)   Wt 169 lb (76.7 kg)   SpO2 96%   BMI 24.25 kg/m  Wt Readings from Last 3 Encounters:  07/23/19 169 lb (76.7 kg)  11/23/18 163 lb 4 oz (74 kg)  09/28/18 163 lb 6.4 oz (74.1 kg)     Health Maintenance Due  Topic Date Due  . COVID-19 Vaccine (1) Never done  . MAMMOGRAM  10/26/2011  . COLONOSCOPY  09/11/2018    There are no preventive care reminders to display for this patient.  Lab Results  Component Value Date   TSH 1.704 09/13/2018   Lab Results  Component Value Date   WBC 10.0 09/28/2018   HGB 13.2 09/28/2018   HCT 39.5 09/28/2018   MCV 86.8 09/28/2018   PLT 335.0 09/28/2018   Lab Results  Component Value Date   NA 139 09/28/2018   K 4.7 09/28/2018   CO2 28 09/28/2018   GLUCOSE 103 (H) 09/28/2018   BUN 13 09/28/2018   CREATININE 1.01 09/28/2018   BILITOT 0.3 09/28/2018   ALKPHOS 81 09/28/2018   AST 12 09/28/2018   ALT 15 09/28/2018   PROT 7.0 09/28/2018   ALBUMIN 4.2 09/28/2018   CALCIUM  9.6 09/28/2018   ANIONGAP 9 09/17/2018   GFR 55.21 (L) 09/28/2018   Lab Results  Component Value Date   CHOL 193 03/14/2017   Lab Results  Component Value Date   HDL 43.40 03/14/2017   Lab Results  Component Value Date   LDLCALC 155 (H) 03/14/2014   Lab Results  Component Value Date   TRIG 212.0 (H)  03/14/2017   Lab Results  Component Value Date   CHOLHDL 4 03/14/2017   Lab Results  Component Value Date   HGBA1C 5.4 09/13/2018      Assessment & Plan:   #1 dyspnea with exertion intermittently.  She has longstanding history of nicotine abuse but also history of CAD.  She does not describe any chest pain but question is whether this could be angina equivalent.  History of polysubstance abuse as above also complicates.  Prior chest x-rays have shown question of COPD though she has not any pulmonary function testing  -Check chest x-ray and some labs.  We also recommend getting back into see cardiology as she is overdue to see them -If above unrevealing consider pulmonary referral and PFTs  #2 fatigue.  Etiology unclear. -Check further labs with TSH, CBC, comprehensive metabolic panel  #3 progressive right hip pain.  She does have decreased range of motion.  Rule out osteoarthritis  -Plain films right hip  #4 probable carpal tunnel syndrome right greater than left  -Recommend trial of nighttime wrist splints and be in touch if not resolving over the next few weeks  #5 history of CAD.  She has basically been lost to follow-up. -Check lipids and will need to discuss getting back on statin -refer back to cardiology.  Will likely need further evaluation of her dyspnea with Echo and possibly nuclear stress.   #6 longstanding history of polysubstance abuse.  Currently followed through methadone clinic.  Recent cocaine use as above.  -We had long discussion today regarding risk including sudden death from substance abuse such as cocaine especially with her cardiac history  Over 40 minutes spent reviewing her history, previous cardiac testing, imaging, and evaluating her current concerns.  No orders of the defined types were placed in this encounter.   Follow-up: No follow-ups on file.    [name], MD

## 2019-07-26 ENCOUNTER — Other Ambulatory Visit: Payer: Self-pay

## 2019-07-26 DIAGNOSIS — R5383 Other fatigue: Secondary | ICD-10-CM

## 2019-07-26 DIAGNOSIS — E785 Hyperlipidemia, unspecified: Secondary | ICD-10-CM

## 2019-07-26 MED ORDER — ATORVASTATIN CALCIUM 20 MG PO TABS: 20.0000 mg | ORAL_TABLET | Freq: Every day | ORAL | 1 refills | Status: DC

## 2019-07-28 ENCOUNTER — Telehealth: Payer: Self-pay | Admitting: Family Medicine

## 2019-07-28 NOTE — Progress Notes (Signed)
  Chronic Care Management   Outreach Note  07/28/2019 Name: Laurie [name] MRN: [id number] DOB: 04-15-1955  Referred by: [name], MD Reason for referral : No chief complaint on file.   A second unsuccessful telephone outreach was attempted today. The patient was referred to pharmacist for assistance with care management and care coordination.  Follow Up Plan:   [place]

## 2019-08-09 ENCOUNTER — Encounter: Payer: Self-pay | Admitting: Family Medicine

## 2019-08-09 ENCOUNTER — Ambulatory Visit (INDEPENDENT_AMBULATORY_CARE_PROVIDER_SITE_OTHER): Payer: Medicare Other | Admitting: Family Medicine

## 2019-08-09 ENCOUNTER — Telehealth: Payer: Self-pay | Admitting: Family Medicine

## 2019-08-09 ENCOUNTER — Other Ambulatory Visit: Payer: Self-pay

## 2019-08-09 VITALS — BP 128/76 | HR 91 | Temp 97.6°F | Wt 171.2 lb

## 2019-08-09 DIAGNOSIS — R0600 Dyspnea, unspecified: Secondary | ICD-10-CM

## 2019-08-09 DIAGNOSIS — I2510 Atherosclerotic heart disease of native coronary artery without angina pectoris: Secondary | ICD-10-CM

## 2019-08-09 DIAGNOSIS — F1123 Opioid dependence with withdrawal: Secondary | ICD-10-CM

## 2019-08-09 DIAGNOSIS — R7989 Other specified abnormal findings of blood chemistry: Secondary | ICD-10-CM | POA: Diagnosis not present

## 2019-08-09 DIAGNOSIS — E785 Hyperlipidemia, unspecified: Secondary | ICD-10-CM | POA: Diagnosis not present

## 2019-08-09 DIAGNOSIS — F1193 Opioid use, unspecified with withdrawal: Secondary | ICD-10-CM

## 2019-08-09 NOTE — Patient Instructions (Signed)
Be sure to set up repeat labs with lipids, hepatic, and thyroid in about 5-6 weeks.

## 2019-08-09 NOTE — Progress Notes (Signed)
°  Chronic Care Management   Note  08/09/2019 Name: Laurie [name] MRN: [id number] DOB: 09/12/1955  [name] is a 64 y.o. year old female who is a primary care patient of Burchette, Alinda Sierras, MD. I reached out to [name] by phone today in response to a referral sent by [name]'s PCP, Burchette, Alinda Sierras, MD.   [name] was given information about Chronic Care Management services today including:  1. CCM service includes personalized support from designated clinical staff supervised by her physician, including individualized plan of care and coordination with other care providers 2. 24/7 contact phone numbers for assistance for urgent and routine care needs. 3. Service will only be billed when office clinical staff spend 20 minutes or more in a month to coordinate care. 4. Only one practitioner may furnish and bill the service in a calendar month. 5. The patient may stop CCM services at any time (effective at the end of the month) by phone call to the office staff.   Patient agreed to services and verbal consent obtained.   Follow up plan:   [place]

## 2019-08-09 NOTE — Progress Notes (Signed)
Established Patient Office Visit  Subjective:  Patient ID: Laurie [name], female    DOB: 1955-12-23  Age: 64 y.o. MRN: [id number]  CC:  Chief Complaint  Patient presents with   Follow-up    2 week follow up pt pain has not gotten better    HPI Laurie [name] presents for medical follow-up.  Refer to recent note from 6-11/21 for details.  Her main complaint then was some increased dyspnea and fatigue especially dyspnea with activity such as climbing stairs.  She has history of known CAD but denies any recent chest pains.  We set up cardiology referral but this is not until August.  She still smokes and we obtain chest x-ray which showed COPD changes.  Denies any recent cough.  She states she did not start smoking until around age 7.  We obtain follow-up labs.  Her lipids were up we started Lipitor which she is taking and tolerating well.  We recommended follow-up lipids and hepatic in 6 to 8 weeks  She did have TSH which was slightly low.  She does have some tremors but these are relatively chronic and currently may be related to some methadone withdrawal (see below).  She has not had any recent weight loss and is fact had some weight gain.  No diarrhea.  No tachycardia currently  She does have some increased sweats and feels increased anxiety after running out of her methadone.  She goes to methadone clinic Monday through Saturday but did not make it late last week and also missed today because she states her alarm did not go off.  She is reportedly on maintenance dose currently 45 mg daily and tapering down.  Denies any other illicit drug use currently though she has used cocaine and other drugs in the past  Past Medical History:  Diagnosis Date   Allergy    Anxiety    Arthritis    RA   Asthma    Chronic back pain    COPD (chronic obstructive pulmonary disease) (HCC)    Depression    Esophageal stricture    GERD (gastroesophageal reflux disease)    Heart murmur     Hematemesis 09/10/2018   Hyperlipidemia    Hypertension    IBS (irritable bowel syndrome)    Interstitial cystitis    Neuromuscular disorder ([place])    fibromyalgia    Past Surgical History:  Procedure Laterality Date   ABDOMINAL HYSTERECTOMY     APPENDECTOMY     BIOPSY  09/11/2018   Procedure: BIOPSY;  Surgeon: Irving Copas., MD;  Location: [facility] ENDOSCOPY;  Service: Gastroenterology;;   CERVICAL FUSION     CERVICAL LAMINECTOMY     ESOPHAGOGASTRODUODENOSCOPY     ESOPHAGOGASTRODUODENOSCOPY (EGD) WITH PROPOFOL N/A 09/11/2018   Procedure: ESOPHAGOGASTRODUODENOSCOPY (EGD) WITH PROPOFOL;  Surgeon: Irving Copas., MD;  Location: [facility] ENDOSCOPY;  Service: Gastroenterology;  Laterality: N/A;   FINGER SURGERY     LUMBAR FUSION     SAVORY DILATION N/A 09/11/2018   Procedure: SAVORY DILATION;  Surgeon: Rush Landmark Telford Nab., MD;  Location: [place];  Service: Gastroenterology;  Laterality: N/A;   TONSILLECTOMY      Family History  Problem Relation Age of Onset   Dementia Mother    Heart attack Father    Coronary artery disease Father    Cancer Brother        esophageal   Esophageal cancer Brother    Coronary artery disease Paternal Aunt    Stomach cancer  Paternal Aunt    Coronary artery disease Paternal Grandmother    Aneurysm Brother        aortic   Rectal cancer Neg Hx    Colon cancer Neg Hx     Social History   Socioeconomic History   Marital status: Single    Spouse name: Not on file   Number of children: Not on file   Years of education: Not on file   Highest education level: Not on file  Occupational History   Not on file  Tobacco Use   Smoking status: Current Every Day Smoker    Packs/day: 0.50    Types: Cigarettes   Smokeless tobacco: Never Used   Tobacco comment: trying to quit   Vaping Use   Vaping Use: Never used  Substance and Sexual Activity   Alcohol use: No    Alcohol/week: 0.0 standard drinks    Drug use: No   Sexual activity: Not on file  Other Topics Concern   Not on file  Social History Narrative   Not on file   Social Determinants of Health   Financial Resource Strain:    Difficulty of Paying Living Expenses:   Food Insecurity:    Worried About Charity fundraiser in the Last Year:    Arboriculturist in the Last Year:   Transportation Needs:    Film/video editor (Medical):    Lack of Transportation (Non-Medical):   Physical Activity:    Days of Exercise per Week:    Minutes of Exercise per Session:   Stress:    Feeling of Stress :   Social Connections:    Frequency of Communication with Friends and Family:    Frequency of Social Gatherings with Friends and Family:    Attends Religious Services:    Active Member of Clubs or Organizations:    Attends Music therapist:    Marital Status:   Intimate Partner Violence:    Fear of Current or Ex-Partner:    Emotionally Abused:    Physically Abused:    Sexually Abused:     Outpatient Medications Prior to Visit  Medication Sig Dispense Refill   albuterol (PROVENTIL HFA;VENTOLIN HFA) 108 (90 Base) MCG/ACT inhaler Inhale 2 puffs into the lungs every 6 (six) hours as needed for wheezing or shortness of breath. 1 Inhaler 0   atorvastatin (LIPITOR) 20 MG tablet Take 1 tablet (20 mg total) by mouth daily. 90 tablet 1   ondansetron (ZOFRAN ODT) 4 MG disintegrating tablet Take 1 tablet (4 mg total) by mouth every 8 (eight) hours as needed for nausea or vomiting. 12 tablet 0   pantoprazole (PROTONIX) 40 MG tablet Take 1 tablet (40 mg total) by mouth 2 (two) times daily. 30 tablet 1   polyethylene glycol (MIRALAX / GLYCOLAX) packet Take 17 g by mouth daily as needed for mild constipation. 14 each 0   venlafaxine XR (EFFEXOR-XR) 150 MG 24 hr capsule Take 1 capsule (150 mg total) by mouth daily with breakfast. 90 capsule 3   Cholecalciferol (VITAMIN D3) 1.25 MG (50000 UT) CAPS Take  1 capsule by mouth once a week. (Patient not taking: Reported on 09/09/2018) 12 capsule 3   gabapentin (NEURONTIN) 100 MG capsule Take 1 capsule (100 mg total) by mouth 3 (three) times daily. 90 capsule 0   amitriptyline (ELAVIL) 10 MG tablet Take 1 tablet (10 mg total) by mouth at bedtime. (Patient not taking: Reported on 11/23/2018) 30 tablet 1  hydrOXYzine (VISTARIL) 25 MG capsule Take 1 capsule (25 mg total) by mouth 3 (three) times daily as needed. (Patient not taking: Reported on 09/09/2018) 30 capsule 0   lactulose (CHRONULAC) 10 GM/15ML solution Take 15 mLs (10 g total) by mouth 2 (two) times daily as needed for moderate constipation or severe constipation. (Patient not taking: Reported on 11/23/2018) 236 mL 0   lidocaine (LIDODERM) 5 % Place 1 patch onto the skin daily. Remove & Discard patch within 12 hours or as directed by MD (Patient not taking: Reported on 09/28/2018) 30 patch 0   No facility-administered medications prior to visit.    Allergies  Allergen Reactions   Codeine     REACTION: Vomiting   Lisinopril Cough   Sulfonamide Derivatives     REACTION: Upset GI   Tetracyclines & Related Rash    ROS Review of Systems  Constitutional: Positive for fatigue. Negative for appetite change and unexpected weight change.  Respiratory: Positive for shortness of breath. Negative for cough and wheezing.   Cardiovascular: Negative for chest pain, palpitations and leg swelling.  Gastrointestinal: Negative for abdominal pain.  Genitourinary: Negative for dysuria.  Musculoskeletal: Positive for back pain.  Neurological: Negative for headaches.  Psychiatric/Behavioral: The patient is nervous/anxious.       Objective:    Physical Exam Vitals reviewed.  Constitutional:      General: She is not in acute distress. Cardiovascular:     Rate and Rhythm: Normal rate and regular rhythm.  Pulmonary:     Effort: Pulmonary effort is normal.     Breath sounds: Normal breath  sounds. No wheezing or rales.  Musculoskeletal:     Right lower leg: No edema.     Left lower leg: No edema.  Neurological:     General: No focal deficit present.     Mental Status: She is alert.     Cranial Nerves: No cranial nerve deficit.  Psychiatric:        Thought Content: Thought content normal.        Judgment: Judgment normal.     BP 128/76 (BP Location: Left Arm, Patient Position: Sitting, Cuff Size: Normal)    Pulse 91    Temp 97.6 F (36.4 C) (Temporal)    Wt 171 lb 3.2 oz (77.7 kg)    SpO2 97%    BMI 24.56 kg/m  Wt Readings from Last 3 Encounters:  08/09/19 171 lb 3.2 oz (77.7 kg)  07/23/19 169 lb (76.7 kg)  11/23/18 163 lb 4 oz (74 kg)     Health Maintenance Due  Topic Date Due   COVID-19 Vaccine (1) Never done   MAMMOGRAM  10/26/2011   COLONOSCOPY  09/11/2018    There are no preventive care reminders to display for this patient.  Lab Results  Component Value Date   TSH 0.33 (L) 07/23/2019   Lab Results  Component Value Date   WBC 10.0 07/23/2019   HGB 13.7 07/23/2019   HCT 40.7 07/23/2019   MCV 87.6 07/23/2019   PLT 254.0 07/23/2019   Lab Results  Component Value Date   NA 137 07/23/2019   K 3.8 07/23/2019   CO2 26 07/23/2019   GLUCOSE 112 (H) 07/23/2019   BUN 20 07/23/2019   CREATININE 0.98 07/23/2019   BILITOT 0.3 07/23/2019   ALKPHOS 76 07/23/2019   AST 11 07/23/2019   ALT 13 07/23/2019   PROT 7.0 07/23/2019   ALBUMIN 4.2 07/23/2019   CALCIUM 9.2 07/23/2019   ANIONGAP 9  09/17/2018   GFR 57.02 (L) 07/23/2019   Lab Results  Component Value Date   CHOL 195 07/23/2019   Lab Results  Component Value Date   HDL 35.30 (L) 07/23/2019   Lab Results  Component Value Date   LDLCALC 124 (H) 07/23/2019   Lab Results  Component Value Date   TRIG 180.0 (H) 07/23/2019   Lab Results  Component Value Date   CHOLHDL 6 07/23/2019   Lab Results  Component Value Date   HGBA1C 5.4 09/13/2018      Assessment & Plan:   #1 history  of CAD.  She has had some increased dyspnea with exertion and increased nonspecific fatigue but no specific chest complaints.  Needs to get back in to see cardiology  -Pending follow-up in August  #2 dyslipidemia.  Goal LDL less than 70.  Recently initiated back on Lipitor  -Repeat lipid and hepatic panel in about 6 weeks  #3 slightly low TSH of uncertain significance.  No overt symptoms of hyperthyroidism -Recheck TSH and free T4 with follow-up labs in about 6 weeks  #4 chronic methadone use followed through the methadone clinic.  She is having some likely withdrawal symptoms in terms of her sweats but vitals are stable.  She is encouraged to follow-up promptly with them regarding her methadone  #5 history of polysubstance abuse. -We again today discussed the seriousness of her use of drugs such as cocaine especially in view of her chronic heart history with increased risk of sudden death  No orders of the defined types were placed in this encounter.   Follow-up: Return in about 3 months (around 11/09/2019).    [name], MD

## 2019-08-29 ENCOUNTER — Other Ambulatory Visit: Payer: Self-pay

## 2019-08-29 ENCOUNTER — Encounter (HOSPITAL_COMMUNITY): Payer: Self-pay | Admitting: *Deleted

## 2019-08-29 ENCOUNTER — Emergency Department (HOSPITAL_COMMUNITY)
Admission: EM | Admit: 2019-08-29 | Discharge: 2019-08-30 | Disposition: A | Discharge status: Home / Self Care | Payer: Medicare Other | Attending: Emergency Medicine | Admitting: Emergency Medicine

## 2019-08-29 DIAGNOSIS — M5117 Intervertebral disc disorders with radiculopathy, lumbosacral region: Secondary | ICD-10-CM | POA: Diagnosis not present

## 2019-08-29 DIAGNOSIS — M4319 Spondylolisthesis, multiple sites in spine: Secondary | ICD-10-CM | POA: Diagnosis not present

## 2019-08-29 DIAGNOSIS — S39012A Strain of muscle, fascia and tendon of lower back, initial encounter: Secondary | ICD-10-CM | POA: Diagnosis not present

## 2019-08-29 DIAGNOSIS — J449 Chronic obstructive pulmonary disease, unspecified: Secondary | ICD-10-CM | POA: Diagnosis not present

## 2019-08-29 DIAGNOSIS — M5489 Other dorsalgia: Secondary | ICD-10-CM | POA: Diagnosis not present

## 2019-08-29 DIAGNOSIS — M4802 Spinal stenosis, cervical region: Secondary | ICD-10-CM | POA: Diagnosis not present

## 2019-08-29 DIAGNOSIS — X500XXA Overexertion from strenuous movement or load, initial encounter: Secondary | ICD-10-CM | POA: Insufficient documentation

## 2019-08-29 DIAGNOSIS — Z7951 Long term (current) use of inhaled steroids: Secondary | ICD-10-CM | POA: Diagnosis not present

## 2019-08-29 DIAGNOSIS — M5416 Radiculopathy, lumbar region: Secondary | ICD-10-CM | POA: Diagnosis not present

## 2019-08-29 DIAGNOSIS — Z9889 Other specified postprocedural states: Secondary | ICD-10-CM | POA: Insufficient documentation

## 2019-08-29 DIAGNOSIS — Y92512 Supermarket, store or market as the place of occurrence of the external cause: Secondary | ICD-10-CM | POA: Insufficient documentation

## 2019-08-29 DIAGNOSIS — I10 Essential (primary) hypertension: Secondary | ICD-10-CM | POA: Insufficient documentation

## 2019-08-29 DIAGNOSIS — M5116 Intervertebral disc disorders with radiculopathy, lumbar region: Secondary | ICD-10-CM | POA: Diagnosis not present

## 2019-08-29 DIAGNOSIS — Z743 Need for continuous supervision: Secondary | ICD-10-CM | POA: Diagnosis not present

## 2019-08-29 DIAGNOSIS — S3992XA Unspecified injury of lower back, initial encounter: Secondary | ICD-10-CM | POA: Diagnosis present

## 2019-08-29 DIAGNOSIS — M5410 Radiculopathy, site unspecified: Secondary | ICD-10-CM | POA: Diagnosis not present

## 2019-08-29 DIAGNOSIS — F17210 Nicotine dependence, cigarettes, uncomplicated: Secondary | ICD-10-CM | POA: Diagnosis not present

## 2019-08-29 DIAGNOSIS — M5114 Intervertebral disc disorders with radiculopathy, thoracic region: Secondary | ICD-10-CM | POA: Diagnosis not present

## 2019-08-29 DIAGNOSIS — Y939 Activity, unspecified: Secondary | ICD-10-CM | POA: Diagnosis not present

## 2019-08-29 DIAGNOSIS — Y999 Unspecified external cause status: Secondary | ICD-10-CM | POA: Insufficient documentation

## 2019-08-29 DIAGNOSIS — Z79899 Other long term (current) drug therapy: Secondary | ICD-10-CM | POA: Insufficient documentation

## 2019-08-29 NOTE — ED Triage Notes (Signed)
The pt arrived bt gems from a grocery store she bent over to lift a box and could hardly straighten up

## 2019-08-29 NOTE — ED Provider Notes (Signed)
TIME SEEN: 11:54 PM  CHIEF COMPLAINT: Back pain  HPI: Patient is a 64 year old female with history of chronic back pain status post previous cervical spine surgery x2 and lumbar spine surgery in 1996 by Dr. Ellene Route who presents to the emergency department with complaints of diffuse back pain. States she has been dealing with worsening pain over the past month but symptoms became significantly exacerbated tonight after she was lifting something to put in her grocery cart at the grocery store and turned and felt her back "give out". States that she had a hard time standing after this and had to call EMS to take her to the hospital from the grocery store. She reports over the past month she has had numbness and pain down both legs, weakness in both upper extremities, 1 episode of urinary incontinence last week and has had multiple falls. She states that she has known thoracic ruptured vertebra and states she has been told she may need surgery in the future. No fever. No history of IV drug abuse. Patient is not a diabetic.  ROS: See HPI Constitutional: no fever  Eyes: no drainage  ENT: no runny nose   Cardiovascular:  no chest pain  Resp: no SOB  GI: no vomiting GU: no dysuria Integumentary: no rash  Allergy: no hives  Musculoskeletal: no leg swelling  Neurological: no slurred speech ROS otherwise negative  PAST MEDICAL HISTORY/PAST SURGICAL HISTORY:  Past Medical History:  Diagnosis Date  . Allergy   . Anxiety   . Arthritis    RA  . Asthma   . Chronic back pain   . COPD (chronic obstructive pulmonary disease) ([place])   . Depression   . Esophageal stricture   . GERD (gastroesophageal reflux disease)   . Heart murmur   . Hematemesis 09/10/2018  . Hyperlipidemia   . Hypertension   . IBS (irritable bowel syndrome)   . Interstitial cystitis   . Neuromuscular disorder (HCC)    fibromyalgia    MEDICATIONS:  Prior to Admission medications   Medication Sig Start Date End Date Taking?  Authorizing Provider  albuterol (PROVENTIL HFA;VENTOLIN HFA) 108 (90 Base) MCG/ACT inhaler Inhale 2 puffs into the lungs every 6 (six) hours as needed for wheezing or shortness of breath. 05/20/17   Nafziger, Tommi Rumps, NP  atorvastatin (LIPITOR) 20 MG tablet Take 1 tablet (20 mg total) by mouth daily. 07/26/19   Burchette, Alinda Sierras, MD  Cholecalciferol (VITAMIN D3) 1.25 MG (50000 UT) CAPS Take 1 capsule by mouth once a week. Patient not taking: Reported on 09/09/2018 06/10/18   [name], MD  gabapentin (NEURONTIN) 100 MG capsule Take 1 capsule (100 mg total) by mouth 3 (three) times daily. 09/18/18 10/18/18  Guilford Shi, MD  ondansetron (ZOFRAN ODT) 4 MG disintegrating tablet Take 1 tablet (4 mg total) by mouth every 8 (eight) hours as needed for nausea or vomiting. 09/18/18   Gu[place]ford Shi, MD  pantoprazole (PROTONIX) 40 MG tablet Take 1 tablet (40 mg total) by mouth 2 (two) times daily. 09/18/18   [place]ford Shi, MD  polyethylene glycol (MIRALAX / GLYCOLAX) packet Take 17 g by mouth daily as needed for mild constipation. 11/06/17   Donne Hazel, MD  venlafaxine XR (EFFEXOR-XR) 150 MG 24 hr capsule Take 1 capsule (150 mg total) by mouth daily with breakfast. 09/23/18   Burchette, Alinda Sierras, MD    ALLERGIES:  Allergies  Allergen Reactions  . Codeine     REACTION: Vomiting  . Lisinopril Cough  .  Sulfonamide Derivatives     REACTION: Upset GI  . Tetracyclines & Related Rash    SOCIAL HISTORY:  Social History   Tobacco Use  . Smoking status: Current Every Day Smoker    Packs/day: 0.50    Types: Cigarettes  . Smokeless tobacco: Never Used  . Tobacco comment: trying to quit   Substance Use Topics  . Alcohol use: No    Alcohol/week: 0.0 standard drinks    FAMILY HISTORY: Family History  Problem Relation Age of Onset  . Dementia Mother   . Heart attack Father   . Coronary artery disease Father   . Cancer Brother        esophageal  . Esophageal cancer Brother   . Coronary  artery disease Paternal Aunt   . Stomach cancer Paternal Aunt   . Coronary artery disease Paternal Grandmother   . Aneurysm Brother        aortic  . Rectal cancer Neg Hx   . Colon cancer Neg Hx     EXAM: BP (!) 138/96 (BP Location: Left Arm)   Pulse 77   Temp 98.3 F (36.8 C) (Oral)   Resp 18   Ht 5\' 10"  (1.778 m)   Wt 77.1 kg   SpO2 100%   BMI 24.39 kg/m  CONSTITUTIONAL: Alert and oriented and responds appropriately to questions. Appears uncomfortable. Afebrile and nontoxic. HEAD: Normocephalic EYES: Conjunctivae clear, pupils appear equal, EOM appear intact ENT: normal nose; moist mucous membranes NECK: Supple, normal ROM, tender throughout the cervical spine without step-off or deformity CARD: RRR; S1 and S2 appreciated; no murmurs, no clicks, no rubs, no gallops RESP: Normal chest excursion without splinting or tachypnea; breath sounds clear and equal bilaterally; no wheezes, no rhonchi, no rales, no hypoxia or respiratory distress, speaking full sentences ABD/GI: Normal bowel sounds; non-distended; soft, non-tender, no rebound, no guarding, no peritoneal signs, no hepatosplenomegaly BACK:  The back appears normal, tender throughout the thoracic and lumbar spine without step-off or deformity, also has diffuse paraspinal muscle tenderness bilaterally without redness, warmth, soft tissue swelling, ecchymosis, rash or other lesions EXT: Normal ROM in all joints; no deformity noted, no edema; no cyanosis SKIN: Normal color for age and race; warm; no rash on exposed skin NEURO: Moves all extremities equally, diminished strength in bilateral lower extremities but this is difficult to tell if it is due to pain, diminished reflexes in all 4 extremities, reports decreased sensation in bilateral lower extremities, normal speech, no facial asymmetry PSYCH: The patient's mood and manner are appropriate.   MEDICAL DECISION MAKING: Patient here with increasing back pain and concerning  symptoms including upper extremity weakness, lower extremity numbness in both legs and one episode of urinary incontinence. Also reports difficulty ambulating with multiple falls over the past month. She has had multiple previous back surgeries. I'm concerned for possible critical stenosis, cauda equina, cervical myelopathy. Low suspicion for infection. She is afebrile here with no immunocompromise state and is not an IV drug abuser. Given her previous history of C and L-spine surgeries and complaints of diffuse pain with known history of T-spine ruptured disc, I feel she needs MRI of her entire spine without contrast. Discussed this with patient and she is agreeable to this plan. Will give IV pain medication and keep her n.p.o. at this time. Labs pending.  ED PROGRESS: 3:08 AM  Patient's labs unremarkable.  Urine unremarkable.  Post void residual 0 mL.  MRI showed no acute abnormality within the cervical, thoracic or lumbar  spine.  No signal abnormality, cord compression, cauda equina.  She does have mild canal stenosis at C6-C7 with bilateral C7 foraminal stenosis.  She has a small right paracentral disc protrusion at T2-3 without significant spinal stenosis.  There is also degenerative disc disease at T1-T2 and T11-T12.  She has a broad central left subarticular disc protrusion at L5-S1 potentially affecting the descending left S1 nerve root.  She also has disc protrusion at L4-5 potentially irritating the right L4 nerve root.  Given no acute surgical pathology present, I feel patient will likely be able to feel to be discharged home once pain has improved and she is able to ambulate.  She has received 2 doses of morphine in the emergency department.  Will attempt to ambulate.   4:15 AM  Pt has been able to ambulate on her own but did have some pain with walking.  Will give Toradol, Robaxin, Decadron.  Anticipate discharge home with prescription of pain medication, anti-inflammatories and taper of steroids.   Recommended follow-up with her PCP as well as her neurosurgeon.  Patient verbalized understanding and she is comfortable with this plan.   At this time, I do not feel there is any life-threatening condition present. I have reviewed, interpreted and discussed all results (EKG, imaging, lab, urine as appropriate) and exam findings with patient/family. I have reviewed nursing notes and appropriate previous records.  I feel the patient is safe to be discharged home without further emergent workup and can continue workup as an outpatient as needed. Discussed usual and customary return precautions. Patient/family verbalize understanding and are comfortable with this plan.  Outpatient follow-up has been provided as needed. All questions have been answered.      Laurie [name] was evaluated in Emergency Department on 08/29/2019 for the symptoms described in the history of present illness. She was evaluated in the context of the global COVID-19 pandemic, which necessitated consideration that the patient might be at risk for infection with the SARS-CoV-2 virus that causes COVID-19. Institutional protocols and algorithms that pertain to the evaluation of patients at risk for COVID-19 are in a state of rapid change based on information released by regulatory bodies including the CDC and federal and state organizations. These policies and algorithms were followed during the patient's care in the ED.      Laurie [name], Delice Bison, DO 08/30/19 [phone]

## 2019-08-30 ENCOUNTER — Emergency Department (HOSPITAL_COMMUNITY): Payer: Medicare Other

## 2019-08-30 DIAGNOSIS — M4802 Spinal stenosis, cervical region: Secondary | ICD-10-CM | POA: Diagnosis not present

## 2019-08-30 DIAGNOSIS — M5117 Intervertebral disc disorders with radiculopathy, lumbosacral region: Secondary | ICD-10-CM | POA: Diagnosis not present

## 2019-08-30 DIAGNOSIS — M5116 Intervertebral disc disorders with radiculopathy, lumbar region: Secondary | ICD-10-CM | POA: Diagnosis not present

## 2019-08-30 DIAGNOSIS — M5114 Intervertebral disc disorders with radiculopathy, thoracic region: Secondary | ICD-10-CM | POA: Diagnosis not present

## 2019-08-30 DIAGNOSIS — M4319 Spondylolisthesis, multiple sites in spine: Secondary | ICD-10-CM | POA: Diagnosis not present

## 2019-08-30 LAB — CBC WITH DIFFERENTIAL/PLATELET
Abs Immature Granulocytes: 0.05 10*3/uL (ref 0.00–0.07)
Basophils Absolute: 0.1 10*3/uL (ref 0.0–0.1)
Basophils Relative: 1 %
Eosinophils Absolute: 0.3 10*3/uL (ref 0.0–0.5)
Eosinophils Relative: 3 %
HCT: 37.5 % (ref 36.0–46.0)
Hemoglobin: 11.9 g/dL — ABNORMAL LOW (ref 12.0–15.0)
Immature Granulocytes: 1 %
Lymphocytes Relative: 25 %
Lymphs Abs: 2.6 10*3/uL (ref 0.7–4.0)
MCH: 28.5 pg (ref 26.0–34.0)
MCHC: 31.7 g/dL (ref 30.0–36.0)
MCV: 89.7 fL (ref 80.0–100.0)
Monocytes Absolute: 0.6 10*3/uL (ref 0.1–1.0)
Monocytes Relative: 5 %
Neutro Abs: 6.8 10*3/uL (ref 1.7–7.7)
Neutrophils Relative %: 65 %
Platelets: 176 10*3/uL (ref 150–400)
RBC: 4.18 MIL/uL (ref 3.87–5.11)
RDW: 13.1 % (ref 11.5–15.5)
WBC: 10.4 10*3/uL (ref 4.0–10.5)
nRBC: 0 % (ref 0.0–0.2)

## 2019-08-30 LAB — URINALYSIS, ROUTINE W REFLEX MICROSCOPIC
Bilirubin Urine: NEGATIVE
Glucose, UA: NEGATIVE mg/dL
Hgb urine dipstick: NEGATIVE
Ketones, ur: NEGATIVE mg/dL
Leukocytes,Ua: NEGATIVE
Nitrite: NEGATIVE
Protein, ur: NEGATIVE mg/dL
Specific Gravity, Urine: 1.021 (ref 1.005–1.030)
pH: 5 (ref 5.0–8.0)

## 2019-08-30 LAB — BASIC METABOLIC PANEL
Anion gap: 9 (ref 5–15)
BUN: 14 mg/dL (ref 8–23)
CO2: 20 mmol/L — ABNORMAL LOW (ref 22–32)
Calcium: 7.6 mg/dL — ABNORMAL LOW (ref 8.9–10.3)
Chloride: 112 mmol/L — ABNORMAL HIGH (ref 98–111)
Creatinine, Ser: 0.8 mg/dL (ref 0.44–1.00)
GFR calc Af Amer: >60 mL/min (ref >60)
GFR calc non Af Amer: >60 mL/min (ref >60)
Glucose, Bld: 92 mg/dL (ref 70–99)
Potassium: 3.7 mmol/L (ref 3.5–5.1)
Sodium: 141 mmol/L (ref 135–145)

## 2019-08-30 MED ORDER — DEXAMETHASONE SODIUM PHOSPHATE 10 MG/ML IJ SOLN
10.0000 mg | Freq: Once | INTRAMUSCULAR | Status: AC
  Administered 2019-08-30: 10 mg via INTRAVENOUS
  Filled 2019-08-30: qty 1

## 2019-08-30 MED ORDER — OXYCODONE-ACETAMINOPHEN 5-325 MG PO TABS: 2.0000 | ORAL_TABLET | Freq: Four times a day (QID) | ORAL | 0 refills | Status: DC | PRN

## 2019-08-30 MED ORDER — ONDANSETRON 4 MG PO TBDP: 4.0000 mg | ORAL_TABLET | Freq: Four times a day (QID) | ORAL | 0 refills | Status: DC | PRN

## 2019-08-30 MED ORDER — KETOROLAC TROMETHAMINE 30 MG/ML IJ SOLN
30.0000 mg | Freq: Once | INTRAMUSCULAR | Status: AC
  Administered 2019-08-30: 30 mg via INTRAVENOUS
  Filled 2019-08-30: qty 1

## 2019-08-30 MED ORDER — METHOCARBAMOL 1000 MG/10ML IJ SOLN
500.0000 mg | Freq: Once | INTRAVENOUS | Status: AC
  Administered 2019-08-30: 500 mg via INTRAVENOUS
  Filled 2019-08-30: qty 5

## 2019-08-30 MED ORDER — IBUPROFEN 800 MG PO TABS
800.0000 mg | ORAL_TABLET | Freq: Three times a day (TID) | ORAL | 0 refills | Status: DC | PRN
Start: 2019-08-30 — End: 2019-10-07

## 2019-08-30 MED ORDER — MORPHINE SULFATE (PF) 4 MG/ML IV SOLN
4.0000 mg | Freq: Once | INTRAVENOUS | Status: AC
  Administered 2019-08-30: 4 mg via INTRAVENOUS
  Filled 2019-08-30: qty 1

## 2019-08-30 MED ORDER — PREDNISONE 10 MG (21) PO TBPK
ORAL_TABLET | ORAL | 0 refills | Status: DC
Start: 2019-08-30 — End: 2019-09-27

## 2019-08-30 MED ORDER — ONDANSETRON HCL 4 MG/2ML IJ SOLN
4.0000 mg | Freq: Once | INTRAMUSCULAR | Status: AC
  Administered 2019-08-30: 4 mg via INTRAVENOUS
  Filled 2019-08-30: qty 2

## 2019-08-30 NOTE — ED Notes (Signed)
Pt verbalized understanding of d/c instructions, follow up care and s/s requiring return to ed. Pt had no further questions and was transported to exit via wheelchair 

## 2019-08-30 NOTE — Discharge Instructions (Signed)
You are being provided a prescription for opiates (also known as narcotics) for pain control.  Opiates can be addictive and should only be used when absolutely necessary for pain control when other alternatives do not work.  We recommend you only use them for the recommended amount of time and only as prescribed.  Please do not take with other sedative medications or alcohol.  Please do not drive, operate machinery, make important decisions while taking opiates.  Please note that these medications can be addictive and have high abuse potential.  Patients can become addicted to narcotics after only taking them for a few days.  Please keep these medications locked away from children, teenagers or any family members with history of substance abuse.  Narcotic pain medicine may also make you constipated.  You may use over-the-counter medications such as MiraLAX, Colace to prevent constipation.  If you become constipated you may use over-the-counter enemas as needed.  Itching and nausea are common side effects of narcotic pain medication.  If you develop uncontrolled vomiting or a rash, please stop these medications. ibupr

## 2019-08-30 NOTE — ED Notes (Signed)
Pt transported to MRI 

## 2019-08-30 NOTE — ED Notes (Signed)
Pt ambulated approx 50 feet with two assist. Pt c/o increased pain of 10/10 in lower back during ambulation. Pt able to bear weight but stated that it increased her pain. Pt had several instances of being off balance and was assisted back to bed.

## 2019-08-31 ENCOUNTER — Telehealth: Payer: Self-pay | Admitting: Family Medicine

## 2019-08-31 NOTE — Telephone Encounter (Signed)
Set up follow-up either in office or virtual if she would like and we can go over results in some detail.  She had multiple findings too numerous to detail on typed note

## 2019-08-31 NOTE — Telephone Encounter (Signed)
Pt scheduled for visit on Friday

## 2019-08-31 NOTE — Telephone Encounter (Signed)
Pt had a MRI done on 08/29/19 at the emergency room and would like to know if the results are available? Pt is not sure if she needs to call the surgeon to schedule an appt and if she needs labs. The emergency room asked her to call her primary physician to go over the MRI results.

## 2019-08-31 NOTE — Telephone Encounter (Signed)
Please advise 

## 2019-09-03 ENCOUNTER — Encounter: Payer: Self-pay | Admitting: Family Medicine

## 2019-09-03 ENCOUNTER — Other Ambulatory Visit: Payer: Self-pay

## 2019-09-03 ENCOUNTER — Ambulatory Visit (INDEPENDENT_AMBULATORY_CARE_PROVIDER_SITE_OTHER): Payer: Medicare Other | Admitting: Family Medicine

## 2019-09-03 VITALS — BP 142/80 | HR 98 | Temp 98.0°F | Ht 70.0 in | Wt 166.2 lb

## 2019-09-03 DIAGNOSIS — R7989 Other specified abnormal findings of blood chemistry: Secondary | ICD-10-CM

## 2019-09-03 DIAGNOSIS — M545 Low back pain: Secondary | ICD-10-CM

## 2019-09-03 DIAGNOSIS — G8929 Other chronic pain: Secondary | ICD-10-CM

## 2019-09-03 DIAGNOSIS — L659 Nonscarring hair loss, unspecified: Secondary | ICD-10-CM

## 2019-09-03 DIAGNOSIS — I2510 Atherosclerotic heart disease of native coronary artery without angina pectoris: Secondary | ICD-10-CM

## 2019-09-03 DIAGNOSIS — E785 Hyperlipidemia, unspecified: Secondary | ICD-10-CM

## 2019-09-03 LAB — HEPATIC FUNCTION PANEL
AG Ratio: 1.5 (calc) (ref 1.0–2.5)
ALT: 14 U/L (ref 6–29)
AST: 9 U/L — ABNORMAL LOW (ref 10–35)
Albumin: 4.1 g/dL (ref 3.6–5.1)
Alkaline phosphatase (APISO): 83 U/L (ref 37–153)
Bilirubin, Direct: 0.1 mg/dL (ref 0.0–0.2)
Globulin: 2.8 g/dL (calc) (ref 1.9–3.7)
Indirect Bilirubin: 0.4 mg/dL (calc) (ref 0.2–1.2)
Total Bilirubin: 0.5 mg/dL (ref 0.2–1.2)
Total Protein: 6.9 g/dL (ref 6.1–8.1)

## 2019-09-03 LAB — LIPID PANEL
Cholesterol: 159 mg/dL (ref <200)
HDL: 46 mg/dL — ABNORMAL LOW (ref >=50)
LDL Cholesterol (Calc): 81 mg/dL (calc)
Non-HDL Cholesterol (Calc): 113 mg/dL (calc) (ref <130)
Total CHOL/HDL Ratio: 3.5 (calc) (ref <5.0)
Triglycerides: 227 mg/dL — ABNORMAL HIGH (ref <150)

## 2019-09-03 LAB — TSH: TSH: 0.15 mIU/L — ABNORMAL LOW (ref 0.40–4.50)

## 2019-09-03 LAB — T4, FREE: Free T4: 1.3 ng/dL (ref 0.8–1.8)

## 2019-09-03 NOTE — Progress Notes (Signed)
Established Patient Office Visit  Subjective:  Patient ID: Laurie [name], female    DOB: November 17, 1955  Age: 64 y.o. MRN: [id number]  CC:  Chief Complaint  Patient presents with  . mri results    HPI Laurie [name] presents for multiple items as below. She was seen in the ER recently on the 18th after being in [employer] and picking up 12 pack of drinks. She felt a sudden severe pain in her lumbar spine. She states she had difficulty moving afterwards. EMS was called. She was taken in and had MRI scan of the cervical, thoracic, and lumbar spine. Thankfully there were no acute findings. A couple levels of possible nerve root impingement lumbar spine. She was given prednisone taper and Percocet and states her pain is better at this time. She has had close follow-up with neurosurgery in the past.  Recent TSH of 0.33. She needs follow-up labs to repeat. She is not having any overt symptoms of hyperthyroidism such as weight loss, diarrhea, tachycardia. She is complaining of some diffuse alopecia and has been losing some hair for quite some time  She has history of CAD and has not had close follow-up with cardiology. She has had some recent fleeting sharp chest pains right upper chest and anterior chest which lasts only seconds. No exertional chest pain. We recently started back Lipitor. Recent LDL cholesterol 124. She is tolerating Lipitor well without side effect.  Past Medical History:  Diagnosis Date  . Allergy   . Anxiety   . Arthritis    RA  . Asthma   . Chronic back pain   . COPD (chronic obstructive pulmonary disease) ([place])   . Depression   . Esophageal stricture   . GERD (gastroesophageal reflux disease)   . Heart murmur   . Hematemesis 09/10/2018  . Hyperlipidemia   . Hypertension   . IBS (irritable bowel syndrome)   . Interstitial cystitis   . Neuromuscular disorder ([place])    fibromyalgia    Past Surgical History:  Procedure Laterality Date  . ABDOMINAL HYSTERECTOMY     . APPENDECTOMY    . BIOPSY  09/11/2018   Procedure: BIOPSY;  Surgeon: Rush Landmark Telford Nab., MD;  Location: [facility];  Service: Gastroenterology;;  . CERVICAL FUSION    . CERVICAL LAMINECTOMY    . ESOPHAGOGASTRODUODENOSCOPY    . ESOPHAGOGASTRODUODENOSCOPY (EGD) WITH PROPOFOL N/A 09/11/2018   Procedure: ESOPHAGOGASTRODUODENOSCOPY (EGD) WITH PROPOFOL;  Surgeon: Rush Landmark Telford Nab., MD;  Location: [facility];  Service: Gastroenterology;  Laterality: N/A;  . FINGER SURGERY    . LUMBAR FUSION    . SAVORY DILATION N/A 09/11/2018   Procedure: SAVORY DILATION;  Surgeon: Rush Landmark Telford Nab., MD;  Location: [facility];  Service: Gastroenterology;  Laterality: N/A;  . TONSILLECTOMY      Family History  Problem Relation Age of Onset  . Dementia Mother   . Heart attack Father   . Coronary artery disease Father   . Cancer Brother        esophageal  . Esophageal cancer Brother   . Coronary artery disease Paternal Aunt   . Stomach cancer Paternal Aunt   . Coronary artery disease Paternal Grandmother   . Aneurysm Brother        aortic  . Rectal cancer Neg Hx   . Colon cancer Neg Hx     Social History   Socioeconomic History  . Marital status: Single    Spouse name: Not on file  . Number of  children: Not on file  . Years of education: Not on file  . Highest education level: Not on file  Occupational History  . Not on file  Tobacco Use  . Smoking status: Current Every Day Smoker    Packs/day: 0.50    Types: Cigarettes  . Smokeless tobacco: Never Used  . Tobacco comment: trying to quit   Vaping Use  . Vaping Use: Never used  Substance and Sexual Activity  . Alcohol use: No    Alcohol/week: 0.0 standard drinks  . Drug use: No  . Sexual activity: Not on file  Other Topics Concern  . Not on file  Social History Narrative  . Not on file   Social Determinants of Health   Financial Resource Strain:   . Difficulty of Paying Living Expenses:   Food Insecurity:     . Worried About Charity fundraiser in the Last Year:   . Arboriculturist in the Last Year:   Transportation Needs:   . Film/video editor (Medical):   Marland Kitchen Lack of Transportation (Non-Medical):   Physical Activity:   . Days of Exercise per Week:   . Minutes of Exercise per Session:   Stress:   . Feeling of Stress :   Social Connections:   . Frequency of Communication with Friends and Family:   . Frequency of Social Gatherings with Friends and Family:   . Attends Religious Services:   . Active Member of Clubs or Organizations:   . Attends Archivist Meetings:   Marland Kitchen Marital Status:   Intimate Partner Violence:   . Fear of Current or Ex-Partner:   . Emotionally Abused:   Marland Kitchen Physically Abused:   . Sexually Abused:     Outpatient Medications Prior to Visit  Medication Sig Dispense Refill  . albuterol (PROVENTIL HFA;VENTOLIN HFA) 108 (90 Base) MCG/ACT inhaler Inhale 2 puffs into the lungs every 6 (six) hours as needed for wheezing or shortness of breath. 1 Inhaler 0  . atorvastatin (LIPITOR) 20 MG tablet Take 1 tablet (20 mg total) by mouth daily. 90 tablet 1  . Cholecalciferol (VITAMIN D3) 1.25 MG (50000 UT) CAPS Take 1 capsule by mouth once a week. 12 capsule 3  . ibuprofen (ADVIL) 800 MG tablet Take 1 tablet (800 mg total) by mouth every 8 (eight) hours as needed for mild pain. 30 tablet 0  . ondansetron (ZOFRAN ODT) 4 MG disintegrating tablet Take 1 tablet (4 mg total) by mouth every 6 (six) hours as needed. 20 tablet 0  . oxyCODONE-acetaminophen (PERCOCET/ROXICET) 5-325 MG tablet Take 2 tablets by mouth every 6 (six) hours as needed for severe pain. 20 tablet 0  . pantoprazole (PROTONIX) 40 MG tablet Take 1 tablet (40 mg total) by mouth 2 (two) times daily. 30 tablet 1  . polyethylene glycol (MIRALAX / GLYCOLAX) packet Take 17 g by mouth daily as needed for mild constipation. 14 each 0  . predniSONE (STERAPRED UNI-PAK 21 TAB) 10 MG (21) TBPK tablet Take as directed 21  tablet 0  . venlafaxine XR (EFFEXOR-XR) 150 MG 24 hr capsule Take 1 capsule (150 mg total) by mouth daily with breakfast. 90 capsule 3  . gabapentin (NEURONTIN) 100 MG capsule Take 1 capsule (100 mg total) by mouth 3 (three) times daily. 90 capsule 0   No facility-administered medications prior to visit.    Allergies  Allergen Reactions  . Codeine     REACTION: Vomiting  . Lisinopril Cough  . Sulfonamide  Derivatives     REACTION: Upset GI  . Tetracyclines & Related Rash    ROS Review of Systems  Constitutional: Negative for chills and fever.  Respiratory: Negative for cough.   Cardiovascular: Negative for leg swelling.  Gastrointestinal: Negative for abdominal pain.  Musculoskeletal: Positive for back pain.  Neurological: Negative for weakness.      Objective:    Physical Exam Vitals reviewed.  Constitutional:      Appearance: Normal appearance.  Cardiovascular:     Rate and Rhythm: Normal rate and regular rhythm.  Pulmonary:     Effort: Pulmonary effort is normal.     Breath sounds: Normal breath sounds.  Musculoskeletal:     Right lower leg: No edema.     Left lower leg: No edema.  Neurological:     Mental Status: She is alert.     BP (!) 142/80   Pulse 98   Temp 98 F (36.7 C) (Oral)   Ht 5\' 10"  (1.778 m)   Wt 166 lb 4 oz (75.4 kg)   SpO2 97%   BMI 23.85 kg/m  Wt Readings from Last 3 Encounters:  09/03/19 166 lb 4 oz (75.4 kg)  08/29/19 170 lb (77.1 kg)  08/09/19 171 lb 3.2 oz (77.7 kg)     Health Maintenance Due  Topic Date Due  . COVID-19 Vaccine (1) Never done  . MAMMOGRAM  10/26/2011  . COLONOSCOPY  09/11/2018    There are no preventive care reminders to display for this patient.  Lab Results  Component Value Date   TSH 0.33 (L) 07/23/2019   Lab Results  Component Value Date   WBC 10.4 08/30/2019   HGB 11.9 (L) 08/30/2019   HCT 37.5 08/30/2019   MCV 89.7 08/30/2019   PLT 176 08/30/2019   Lab Results  Component Value Date    NA 141 08/30/2019   K 3.7 08/30/2019   CO2 20 (L) 08/30/2019   GLUCOSE 92 08/30/2019   BUN 14 08/30/2019   CREATININE 0.80 08/30/2019   BILITOT 0.3 07/23/2019   ALKPHOS 76 07/23/2019   AST 11 07/23/2019   ALT 13 07/23/2019   PROT 7.0 07/23/2019   ALBUMIN 4.2 07/23/2019   CALCIUM 7.6 (L) 08/30/2019   ANIONGAP 9 08/30/2019   GFR 57.02 (L) 07/23/2019   Lab Results  Component Value Date   CHOL 195 07/23/2019   Lab Results  Component Value Date   HDL 35.30 (L) 07/23/2019   Lab Results  Component Value Date   LDLCALC 124 (H) 07/23/2019   Lab Results  Component Value Date   TRIG 180.0 (H) 07/23/2019   Lab Results  Component Value Date   CHOLHDL 6 07/23/2019   Lab Results  Component Value Date   HGBA1C 5.4 09/13/2018      Assessment & Plan:   Problem List Items Addressed This Visit      Unprioritized   Hyperlipidemia   CAD, NATIVE VESSEL - Primary   Relevant Orders   Lipid panel   Hepatic function panel    Other Visit Diagnoses    Abnormal TSH       Relevant Orders   TSH   T4, Free    -Recheck labs including lipid, hepatic, TSH, free T4  -If labs normal consider trial of over-the-counter Rogaine topically  -She has cardiology follow-up pending for August  No orders of the defined types were placed in this encounter.   Follow-up: No follow-ups on file.    [name], MD

## 2019-09-20 ENCOUNTER — Other Ambulatory Visit: Payer: Self-pay

## 2019-09-20 DIAGNOSIS — E785 Hyperlipidemia, unspecified: Secondary | ICD-10-CM

## 2019-09-20 DIAGNOSIS — I2510 Atherosclerotic heart disease of native coronary artery without angina pectoris: Secondary | ICD-10-CM

## 2019-09-20 DIAGNOSIS — I10 Essential (primary) hypertension: Secondary | ICD-10-CM

## 2019-09-24 ENCOUNTER — Ambulatory Visit: Payer: Medicare Other

## 2019-09-27 ENCOUNTER — Other Ambulatory Visit: Payer: Self-pay

## 2019-09-27 ENCOUNTER — Ambulatory Visit (INDEPENDENT_AMBULATORY_CARE_PROVIDER_SITE_OTHER): Payer: Medicare Other | Admitting: Cardiovascular Disease

## 2019-09-27 ENCOUNTER — Encounter: Payer: Self-pay | Admitting: Cardiovascular Disease

## 2019-09-27 VITALS — BP 144/78 | HR 75 | Ht 70.0 in | Wt 168.7 lb

## 2019-09-27 DIAGNOSIS — I25110 Atherosclerotic heart disease of native coronary artery with unstable angina pectoris: Secondary | ICD-10-CM

## 2019-09-27 LAB — CBC WITH DIFFERENTIAL/PLATELET
Basophils Absolute: 0.1 10*3/uL (ref 0.0–0.2)
Basos: 1 %
EOS (ABSOLUTE): 0.5 10*3/uL — ABNORMAL HIGH (ref 0.0–0.4)
Eos: 5 %
Hematocrit: 39.7 % (ref 34.0–46.6)
Hemoglobin: 13.1 g/dL (ref 11.1–15.9)
Immature Grans (Abs): 0.1 10*3/uL (ref 0.0–0.1)
Immature Granulocytes: 1 %
Lymphocytes Absolute: 2.7 10*3/uL (ref 0.7–3.1)
Lymphs: 26 %
MCH: 28.7 pg (ref 26.6–33.0)
MCHC: 33 g/dL (ref 31.5–35.7)
MCV: 87 fL (ref 79–97)
Monocytes Absolute: 0.7 10*3/uL (ref 0.1–0.9)
Monocytes: 7 %
Neutrophils Absolute: 6.1 10*3/uL (ref 1.4–7.0)
Neutrophils: 60 %
Platelets: 283 10*3/uL (ref 150–450)
RBC: 4.56 x10E6/uL (ref 3.77–5.28)
RDW: 12.9 % (ref 11.7–15.4)
WBC: 10.1 10*3/uL (ref 3.4–10.8)

## 2019-09-27 LAB — BASIC METABOLIC PANEL
BUN/Creatinine Ratio: 13 (ref 12–28)
BUN: 12 mg/dL (ref 8–27)
CO2: 26 mmol/L (ref 20–29)
Calcium: 9.5 mg/dL (ref 8.7–10.3)
Chloride: 102 mmol/L (ref 96–106)
Creatinine, Ser: 0.92 mg/dL (ref 0.57–1.00)
GFR calc Af Amer: 76 mL/min/{1.73_m2} (ref >59)
GFR calc non Af Amer: 66 mL/min/{1.73_m2} (ref >59)
Glucose: 109 mg/dL — ABNORMAL HIGH (ref 65–99)
Potassium: 4.2 mmol/L (ref 3.5–5.2)
Sodium: 142 mmol/L (ref 134–144)

## 2019-09-27 MED ORDER — ASPIRIN EC 81 MG PO TBEC
81.0000 mg | DELAYED_RELEASE_TABLET | Freq: Every day | ORAL | 3 refills | Status: AC
Start: 2019-09-27 — End: ?

## 2019-09-27 NOTE — H&P (View-Only) (Signed)
Chief Complaint  Patient presents with  . New Patient (Initial Visit)   History of Present Illness: 64 yo female with history of anxiety, rheumatoid arthritis, COPD/asthma, esophageal stricture, GERD, HTN, HLD, irritable bowel syndrome, tobacco abuse and fibromyalgia who is here today as a new consult, referred by Dr. Elease Hashimoto, for evaluation of chest pain. Echo in 2019 with LVEF 65%, mild MR. Cardiac cath in 2011 with mild CAD. Chronically abnormal EKG. Last seen in our office in 2014 by Dr. Johnsie Cancel.   She tells me today that she has been having pains in her chest over the past month. This occurs with minimal exertion. She also has dyspnea on exertion. No LE edema. She continues to smoke.   Primary Care Physician: [name], MD   Past Medical History:  Diagnosis Date  . Allergy   . Anxiety   . Arthritis    RA  . Asthma   . Chronic back pain   . COPD (chronic obstructive pulmonary disease) ([place])   . Depression   . Esophageal stricture   . GERD (gastroesophageal reflux disease)   . Heart murmur   . Hematemesis 09/10/2018  . Hyperlipidemia   . Hypertension   . IBS (irritable bowel syndrome)   . Interstitial cystitis   . Neuromuscular disorder ([place])    fibromyalgia    Past Surgical History:  Procedure Laterality Date  . ABDOMINAL HYSTERECTOMY    . APPENDECTOMY    . BIOPSY  09/11/2018   Procedure: BIOPSY;  Surgeon: Rush Landmark Telford Nab., MD;  Location: [facility];  Service: Gastroenterology;;  . CERVICAL FUSION    . CERVICAL LAMINECTOMY    . ESOPHAGOGASTRODUODENOSCOPY    . ESOPHAGOGASTRODUODENOSCOPY (EGD) WITH PROPOFOL N/A 09/11/2018   Procedure: ESOPHAGOGASTRODUODENOSCOPY (EGD) WITH PROPOFOL;  Surgeon: Rush Landmark Telford Nab., MD;  Location: [facility];  Service: Gastroenterology;  Laterality: N/A;  . FINGER SURGERY    . LUMBAR FUSION    . SAVORY DILATION N/A 09/11/2018   Procedure: SAVORY DILATION;  Surgeon: Rush Landmark Telford Nab., MD;  Location: [facility];  Service: Gastroenterology;  Laterality: N/A;  . TONSILLECTOMY      Current Outpatient Medications  Medication Sig Dispense Refill  . atorvastatin (LIPITOR) 20 MG tablet Take 1 tablet (20 mg total) by mouth daily. 90 tablet 1  . Cholecalciferol (VITAMIN D3) 1.25 MG (50000 UT) CAPS Take 1 capsule by mouth once a week. 12 capsule 3  . ibuprofen (ADVIL) 800 MG tablet Take 1 tablet (800 mg total) by mouth every 8 (eight) hours as needed for mild pain. 30 tablet 0  . ondansetron (ZOFRAN ODT) 4 MG disintegrating tablet Take 1 tablet (4 mg total) by mouth every 6 (six) hours as needed. 20 tablet 0  . polyethylene glycol (MIRALAX / GLYCOLAX) packet Take 17 g by mouth daily as needed for mild constipation. 14 each 0  . venlafaxine XR (EFFEXOR-XR) 150 MG 24 hr capsule Take 1 capsule (150 mg total) by mouth daily with breakfast. 90 capsule 3  . aspirin EC 81 MG tablet Take 1 tablet (81 mg total) by mouth daily. Swallow whole. 90 tablet 3   No current facility-administered medications for this visit.    Allergies  Allergen Reactions  . Codeine     REACTION: Vomiting  . Lisinopril Cough  . Sulfonamide Derivatives     REACTION: Upset GI  . Tetracyclines & Related Rash    Social History   Socioeconomic History  . Marital status: Single    Spouse name:  Not on file  . Number of children: Not on file  . Years of education: Not on file  . Highest education level: Not on file  Occupational History  . Occupation: disability  Tobacco Use  . Smoking status: Current Every Day Smoker    Packs/day: 0.50    Types: Cigarettes  . Smokeless tobacco: Never Used  . Tobacco comment: trying to quit   Vaping Use  . Vaping Use: Never used  Substance and Sexual Activity  . Alcohol use: No    Alcohol/week: 0.0 standard drinks  . Drug use: No  . Sexual activity: Not on file  Other Topics Concern  . Not on file  Social History Narrative  . Not on file   Social Determinants of Health    Financial Resource Strain:   . Difficulty of Paying Living Expenses:   Food Insecurity:   . Worried About Charity fundraiser in the Last Year:   . Arboriculturist in the Last Year:   Transportation Needs:   . Film/video editor (Medical):   Marland Kitchen Lack of Transportation (Non-Medical):   Physical Activity:   . Days of Exercise per Week:   . Minutes of Exercise per Session:   Stress:   . Feeling of Stress :   Social Connections:   . Frequency of Communication with Friends and Family:   . Frequency of Social Gatherings with Friends and Family:   . Attends Religious Services:   . Active Member of Clubs or Organizations:   . Attends Archivist Meetings:   Marland Kitchen Marital Status:   Intimate Partner Violence:   . Fear of Current or Ex-Partner:   . Emotionally Abused:   Marland Kitchen Physically Abused:   . Sexually Abused:     Family History  Problem Relation Age of Onset  . Dementia Mother   . Heart attack Father   . Coronary artery disease Father   . Cancer Brother        esophageal  . Esophageal cancer Brother   . Coronary artery disease Paternal Aunt   . Stomach cancer Paternal Aunt   . Coronary artery disease Paternal Grandmother   . Aneurysm Brother        aortic  . Rectal cancer Neg Hx   . Colon cancer Neg Hx     Review of Systems:  As stated in the HPI and otherwise negative.   BP (!) 144/78   Pulse 75   Ht 5\' 10"  (1.778 m)   Wt 168 lb 11.2 oz (76.5 kg)   SpO2 97%   BMI 24.21 kg/m   Physical Examination: General: Well developed, well nourished, NAD  HEENT: OP clear, mucus membranes moist  SKIN: warm, dry. No rashes. Neuro: No focal deficits  Musculoskeletal: Muscle strength 5/5 all ext  Psychiatric: Mood and affect normal  Neck: No JVD, no carotid bruits, no thyromegaly, no lymphadenopathy.  Lungs:Clear bilaterally, no wheezes, rhonci, crackles Cardiovascular: Regular rate and rhythm. No murmurs, gallops or rubs. Abdomen:Soft. Bowel sounds present.  Non-tender.  Extremities: No lower extremity edema. Pulses are 2 + in the bilateral DP/PT.  EKG:  EKG is ordered today. The ekg ordered today demonstrates Sinus, non-specific T wave abn, ST depression, unchanged  Recent Labs: 08/30/2019: BUN 14; Creatinine, Ser 0.80; Hemoglobin 11.9; Platelets 176; Potassium 3.7; Sodium 141 09/03/2019: ALT 14; TSH 0.15   Lipid Panel    Component Value Date/Time   CHOL 159 09/03/2019 1353   TRIG 227 (H) 09/03/2019  1353   HDL 46 (L) 09/03/2019 1353   CHOLHDL 3.5 09/03/2019 1353   VLDL 36.0 07/23/2019 1441   LDLCALC 81 09/03/2019 1353   LDLDIRECT 123.0 03/14/2017 0948     Wt Readings from Last 3 Encounters:  09/27/19 168 lb 11.2 oz (76.5 kg)  09/03/19 166 lb 4 oz (75.4 kg)  08/29/19 170 lb (77.1 kg)      Assessment and Plan:   1. CAD with unstable angina: Symptoms worrisome for unstable angina. Mild CAD by cath 10 years ago. Will arrange a cardiac cath at [facility] 10/07/19.  I have reviewed the risks, indications, and alternatives to cardiac catheterization, possible angioplasty, and stenting with the patient. Risks include but are not limited to bleeding, infection, vascular injury, stroke, myocardial infection, arrhythmia, kidney injury, radiation-related injury in the case of prolonged fluoroscopy use, emergency cardiac surgery, and death. The patient understands the risks of serious complication is 1-2 in 6283 with diagnostic cardiac cath and 1-2% or less with angioplasty/stenting. -Start ASA 81 mg per day. Continue statin.  -Pre-cath labs today  2. Tobacco abuse: Smoking cessation recommended  Current medicines are reviewed at length with the patient today.  The patient does not have concerns regarding medicines.  The following changes have been made:  no change  Labs/ tests ordered today include:   Orders Placed This Encounter  Procedures  . Basic metabolic panel  . CBC with Differential/Platelet  . EKG 12-Lead     Disposition:   FU  with care team APP in 6-8 weeks.    Signed, Lauree Chandler, MD 09/27/2019 10:27 AM    [address], [place], [place]  [address] Phone: [phone]; Fax: [phone]

## 2019-09-27 NOTE — Patient Instructions (Addendum)
Medication Instructions:  1) START ASPIRIN 81 mg daily *If you need a refill on your cardiac medications before your next appointment, please call your pharmacy*  Lab Work: TODAY! BMET, CBC If you have labs (blood work) drawn today and your tests are completely normal, you will receive your results only by: Marland Kitchen MyChart Message (if you have MyChart) OR . A paper copy in the mail If you have any lab test that is abnormal or we need to change your treatment, we will call you to review the results.  Testing/Procedures: Your physician has requested that you have a cardiac catheterization. Cardiac catheterization is used to diagnose and/or treat various heart conditions. Doctors may recommend this procedure for a number of different reasons. The most common reason is to evaluate chest pain. Chest pain can be a symptom of coronary artery disease (CAD), and cardiac catheterization can show whether plaque is narrowing or blocking your heart's arteries. This procedure is also used to evaluate the valves, as well as measure the blood flow and oxygen levels in different parts of your heart. For further information please visit [email or web address]. Please follow instruction sheet, as given.  Follow-Up: You have an appointment with Dr. Camillia Herter PA on 10/19/2019 at 8:45AM.

## 2019-09-27 NOTE — Progress Notes (Signed)
Chief Complaint  Patient presents with  . New Patient (Initial Visit)   History of Present Illness: 64 yo female with history of anxiety, rheumatoid arthritis, COPD/asthma, esophageal stricture, GERD, HTN, HLD, irritable bowel syndrome, tobacco abuse and fibromyalgia who is here today as a new consult, referred by Dr. Elease Hashimoto, for evaluation of chest pain. Echo in 2019 with LVEF 65%, mild MR. Cardiac cath in 2011 with mild CAD. Chronically abnormal EKG. Last seen in our office in 2014 by Dr. Johnsie Cancel.   She tells me today that she has been having pains in her chest over the past month. This occurs with minimal exertion. She also has dyspnea on exertion. No LE edema. She continues to smoke.   Primary Care Physician: [name], MD   Past Medical History:  Diagnosis Date  . Allergy   . Anxiety   . Arthritis    RA  . Asthma   . Chronic back pain   . COPD (chronic obstructive pulmonary disease) ([place])   . Depression   . Esophageal stricture   . GERD (gastroesophageal reflux disease)   . Heart murmur   . Hematemesis 09/10/2018  . Hyperlipidemia   . Hypertension   . IBS (irritable bowel syndrome)   . Interstitial cystitis   . Neuromuscular disorder ([place])    fibromyalgia    Past Surgical History:  Procedure Laterality Date  . ABDOMINAL HYSTERECTOMY    . APPENDECTOMY    . BIOPSY  09/11/2018   Procedure: BIOPSY;  Surgeon: Rush Landmark Telford Nab., MD;  Location: [place];  Service: Gastroenterology;;  . CERVICAL FUSION    . CERVICAL LAMINECTOMY    . ESOPHAGOGASTRODUODENOSCOPY    . ESOPHAGOGASTRODUODENOSCOPY (EGD) WITH PROPOFOL N/A 09/11/2018   Procedure: ESOPHAGOGASTRODUODENOSCOPY (EGD) WITH PROPOFOL;  Surgeon: Rush Landmark Telford Nab., MD;  Location: [facility];  Service: Gastroenterology;  Laterality: N/A;  . FINGER SURGERY    . LUMBAR FUSION    . SAVORY DILATION N/A 09/11/2018   Procedure: SAVORY DILATION;  Surgeon: Rush Landmark Telford Nab., MD;  Location: [place];  Service: Gastroenterology;  Laterality: N/A;  . TONSILLECTOMY      Current Outpatient Medications  Medication Sig Dispense Refill  . atorvastatin (LIPITOR) 20 MG tablet Take 1 tablet (20 mg total) by mouth daily. 90 tablet 1  . Cholecalciferol (VITAMIN D3) 1.25 MG (50000 UT) CAPS Take 1 capsule by mouth once a week. 12 capsule 3  . ibuprofen (ADVIL) 800 MG tablet Take 1 tablet (800 mg total) by mouth every 8 (eight) hours as needed for mild pain. 30 tablet 0  . ondansetron (ZOFRAN ODT) 4 MG disintegrating tablet Take 1 tablet (4 mg total) by mouth every 6 (six) hours as needed. 20 tablet 0  . polyethylene glycol (MIRALAX / GLYCOLAX) packet Take 17 g by mouth daily as needed for mild constipation. 14 each 0  . venlafaxine XR (EFFEXOR-XR) 150 MG 24 hr capsule Take 1 capsule (150 mg total) by mouth daily with breakfast. 90 capsule 3  . aspirin EC 81 MG tablet Take 1 tablet (81 mg total) by mouth daily. Swallow whole. 90 tablet 3   No current facility-administered medications for this visit.    Allergies  Allergen Reactions  . Codeine     REACTION: Vomiting  . Lisinopril Cough  . Sulfonamide Derivatives     REACTION: Upset GI  . Tetracyclines & Related Rash    Social History   Socioeconomic History  . Marital status: Single    Spouse name:  Not on file  . Number of children: Not on file  . Years of education: Not on file  . Highest education level: Not on file  Occupational History  . Occupation: disability  Tobacco Use  . Smoking status: Current Every Day Smoker    Packs/day: 0.50    Types: Cigarettes  . Smokeless tobacco: Never Used  . Tobacco comment: trying to quit   Vaping Use  . Vaping Use: Never used  Substance and Sexual Activity  . Alcohol use: No    Alcohol/week: 0.0 standard drinks  . Drug use: No  . Sexual activity: Not on file  Other Topics Concern  . Not on file  Social History Narrative  . Not on file   Social Determinants of Health    Financial Resource Strain:   . Difficulty of Paying Living Expenses:   Food Insecurity:   . Worried About Charity fundraiser in the Last Year:   . Arboriculturist in the Last Year:   Transportation Needs:   . Film/video editor (Medical):   Marland Kitchen Lack of Transportation (Non-Medical):   Physical Activity:   . Days of Exercise per Week:   . Minutes of Exercise per Session:   Stress:   . Feeling of Stress :   Social Connections:   . Frequency of Communication with Friends and Family:   . Frequency of Social Gatherings with Friends and Family:   . Attends Religious Services:   . Active Member of Clubs or Organizations:   . Attends Archivist Meetings:   Marland Kitchen Marital Status:   Intimate Partner Violence:   . Fear of Current or Ex-Partner:   . Emotionally Abused:   Marland Kitchen Physically Abused:   . Sexually Abused:     Family History  Problem Relation Age of Onset  . Dementia Mother   . Heart attack Father   . Coronary artery disease Father   . Cancer Brother        esophageal  . Esophageal cancer Brother   . Coronary artery disease Paternal Aunt   . Stomach cancer Paternal Aunt   . Coronary artery disease Paternal Grandmother   . Aneurysm Brother        aortic  . Rectal cancer Neg Hx   . Colon cancer Neg Hx     Review of Systems:  As stated in the HPI and otherwise negative.   BP (!) 144/78   Pulse 75   Ht 5\' 10"  (1.778 m)   Wt 168 lb 11.2 oz (76.5 kg)   SpO2 97%   BMI 24.21 kg/m   Physical Examination: General: Well developed, well nourished, NAD  HEENT: OP clear, mucus membranes moist  SKIN: warm, dry. No rashes. Neuro: No focal deficits  Musculoskeletal: Muscle strength 5/5 all ext  Psychiatric: Mood and affect normal  Neck: No JVD, no carotid bruits, no thyromegaly, no lymphadenopathy.  Lungs:Clear bilaterally, no wheezes, rhonci, crackles Cardiovascular: Regular rate and rhythm. No murmurs, gallops or rubs. Abdomen:Soft. Bowel sounds present.  Non-tender.  Extremities: No lower extremity edema. Pulses are 2 + in the bilateral DP/PT.  EKG:  EKG is ordered today. The ekg ordered today demonstrates Sinus, non-specific T wave abn, ST depression, unchanged  Recent Labs: 08/30/2019: BUN 14; Creatinine, Ser 0.80; Hemoglobin 11.9; Platelets 176; Potassium 3.7; Sodium 141 09/03/2019: ALT 14; TSH 0.15   Lipid Panel    Component Value Date/Time   CHOL 159 09/03/2019 1353   TRIG 227 (H) 09/03/2019  1353   HDL 46 (L) 09/03/2019 1353   CHOLHDL 3.5 09/03/2019 1353   VLDL 36.0 07/23/2019 1441   LDLCALC 81 09/03/2019 1353   LDLDIRECT 123.0 03/14/2017 0948     Wt Readings from Last 3 Encounters:  09/27/19 168 lb 11.2 oz (76.5 kg)  09/03/19 166 lb 4 oz (75.4 kg)  08/29/19 170 lb (77.1 kg)      Assessment and Plan:   1. CAD with unstable angina: Symptoms worrisome for unstable angina. Mild CAD by cath 10 years ago. Will arrange a cardiac cath at [facility] 10/07/19.  I have reviewed the risks, indications, and alternatives to cardiac catheterization, possible angioplasty, and stenting with the patient. Risks include but are not limited to bleeding, infection, vascular injury, stroke, myocardial infection, arrhythmia, kidney injury, radiation-related injury in the case of prolonged fluoroscopy use, emergency cardiac surgery, and death. The patient understands the risks of serious complication is 1-2 in 8099 with diagnostic cardiac cath and 1-2% or less with angioplasty/stenting. -Start ASA 81 mg per day. Continue statin.  -Pre-cath labs today  2. Tobacco abuse: Smoking cessation recommended  Current medicines are reviewed at length with the patient today.  The patient does not have concerns regarding medicines.  The following changes have been made:  no change  Labs/ tests ordered today include:   Orders Placed This Encounter  Procedures  . Basic metabolic panel  . CBC with Differential/Platelet  . EKG 12-Lead     Disposition:   FU  with care team APP in 6-8 weeks.    Signed, Lauree Chandler, MD 09/27/2019 10:27 AM    [address], [place], [place]  [address] Phone: [phone]; Fax: [phone]

## 2019-09-29 ENCOUNTER — Telehealth: Payer: Self-pay | Admitting: Family Medicine

## 2019-09-29 NOTE — Progress Notes (Signed)
°  Chronic Care Management   Outreach Note  09/29/2019 Name: Laurie [name] MRN: [id number] DOB: 1955/06/14  Referred by: [name], MD Reason for referral : No chief complaint on file.   An unsuccessful telephone outreach was attempted today. The patient was referred to the pharmacist for assistance with care management and care coordination.   Follow Up Plan:   Carley Perdue [employer] Scheduler

## 2019-09-30 ENCOUNTER — Telehealth: Payer: Self-pay | Admitting: Family Medicine

## 2019-09-30 NOTE — Progress Notes (Signed)
  Chronic Care Management   Outreach Note  09/30/2019 Name: ESTEL TONELLI MRN: [id number] DOB: 1955-07-16  Referred by: [name], MD Reason for referral : No chief complaint on file.   A second unsuccessful telephone outreach was attempted today. The patient was referred to pharmacist for assistance with care management and care coordination.  Follow Up Plan:   Carley Perdue [employer] Scheduler

## 2019-10-01 ENCOUNTER — Telehealth: Payer: Self-pay | Admitting: Family Medicine

## 2019-10-01 NOTE — Progress Notes (Signed)
  Chronic Care Management   Note  10/01/2019 Name: INES REBEL MRN: [id number] DOB: 04/08/1955  RYLYN ZAWISTOWSKI is a 64 y.o. year old female who is a primary care patient of Burchette, Alinda Sierras, MD. I reached out to [name] by phone today in response to a referral sent by Ms. [name] D Ramseyer's PCP, Burchette, Alinda Sierras, MD.   Ms. Matthes was given information about Chronic Care Management services today including:  1. CCM service includes personalized support from designated clinical staff supervised by her physician, including individualized plan of care and coordination with other care providers 2. 24/7 contact phone numbers for assistance for urgent and routine care needs. 3. Service will only be billed when office clinical staff spend 20 minutes or more in a month to coordinate care. 4. Only one practitioner may furnish and bill the service in a calendar month. 5. The patient may stop CCM services at any time (effective at the end of the month) by phone call to the office staff.   Patient agreed to services and verbal consent obtained.   Follow up plan:   Carley Perdue [employer] Scheduler

## 2019-10-05 ENCOUNTER — Other Ambulatory Visit (HOSPITAL_COMMUNITY)
Admission: RE | Admit: 2019-10-05 | Discharge: 2019-10-05 | Disposition: A | Discharge status: Home / Self Care | Payer: Medicare Other | Source: Ambulatory Visit | Attending: Cardiovascular Disease | Admitting: Cardiovascular Disease

## 2019-10-05 DIAGNOSIS — Z20822 Contact with and (suspected) exposure to covid-19: Secondary | ICD-10-CM | POA: Diagnosis not present

## 2019-10-05 DIAGNOSIS — Z01812 Encounter for preprocedural laboratory examination: Secondary | ICD-10-CM | POA: Diagnosis not present

## 2019-10-05 LAB — SARS CORONAVIRUS 2 (TAT 6-24 HRS): SARS Coronavirus 2: NEGATIVE

## 2019-10-06 ENCOUNTER — Telehealth: Payer: Self-pay | Admitting: *Deleted

## 2019-10-06 NOTE — Telephone Encounter (Signed)
Pt contacted pre-catheterization scheduled at [facility] for: Thursday October 07, 2019 9 AM Verified arrival time and place: [facility]) at: 7 AM   No solid food after midnight prior to cath, clear liquids until 5 AM day of procedure.  AM meds can be  taken pre-cath with sips of water including: ASA 81 mg   Confirmed patient has responsible adult to drive home post procedure and observe 24 hours after arriving home:   You are allowed ONE visitor in the waiting room during the time you are at the hospital for your procedure. Both you and your visitor must wear a mask once you enter the hospital.       COVID-19 Pre-Screening Questions:   In the past 10 days have you had a new cough, shortness of breath, headache, congestion, fever (100 or greater) unexplained body aches, new sore throat, or sudden loss of taste or sense of smell?  In the past 10 days have you been around anyone with known Covid 19?   Have you been vaccinated for COVID-19?   Follow up scheduled with Richardson Dopp, PA 10/19/19 8:45 AM.  LMTCB to review procedure instructions with patient.

## 2019-10-06 NOTE — Telephone Encounter (Signed)
Voicemail message, no answer. 

## 2019-10-06 NOTE — Telephone Encounter (Signed)
Pt returning a call from our office

## 2019-10-06 NOTE — Telephone Encounter (Signed)
Left message for patient to call back  

## 2019-10-06 NOTE — Telephone Encounter (Signed)
The cath had been denied by patient's insurance.  Pt was aware and would need to be cancelling cath this afternoon for tomorrow unless appeal was approved. She is expecting my call at 4:30 today.  Update from Pre Auth department--appeal has been approved. Called to let patient know. Home number is busy.  Left message on mobile number to call back.

## 2019-10-06 NOTE — Telephone Encounter (Signed)
Spoke with patient, informed authorization has been approved per [employer] Dept (A. [place]).  Reviewed instructions for coming to hospital tomorrow am.   Pt verbalizes understanding and appreciation.

## 2019-10-07 ENCOUNTER — Ambulatory Visit (HOSPITAL_COMMUNITY)
Admission: RE | Admit: 2019-10-07 | Discharge: 2019-10-07 | Disposition: A | Discharge status: Home / Self Care | Payer: Medicare Other | Attending: Cardiovascular Disease | Admitting: Cardiovascular Disease

## 2019-10-07 ENCOUNTER — Other Ambulatory Visit: Payer: Self-pay

## 2019-10-07 ENCOUNTER — Ambulatory Visit (HOSPITAL_COMMUNITY)
Admission: RE | Disposition: A | Discharge status: Home / Self Care | Payer: Medicare Other | Source: Home / Self Care | Attending: Cardiovascular Disease

## 2019-10-07 DIAGNOSIS — E785 Hyperlipidemia, unspecified: Secondary | ICD-10-CM | POA: Insufficient documentation

## 2019-10-07 DIAGNOSIS — J449 Chronic obstructive pulmonary disease, unspecified: Secondary | ICD-10-CM | POA: Diagnosis not present

## 2019-10-07 DIAGNOSIS — Z7982 Long term (current) use of aspirin: Secondary | ICD-10-CM | POA: Diagnosis not present

## 2019-10-07 DIAGNOSIS — Z95820 Peripheral vascular angioplasty status with implants and grafts: Secondary | ICD-10-CM

## 2019-10-07 DIAGNOSIS — Z885 Allergy status to narcotic agent status: Secondary | ICD-10-CM | POA: Insufficient documentation

## 2019-10-07 DIAGNOSIS — Z79899 Other long term (current) drug therapy: Secondary | ICD-10-CM | POA: Insufficient documentation

## 2019-10-07 DIAGNOSIS — I10 Essential (primary) hypertension: Secondary | ICD-10-CM | POA: Diagnosis not present

## 2019-10-07 DIAGNOSIS — R0609 Other forms of dyspnea: Secondary | ICD-10-CM | POA: Insufficient documentation

## 2019-10-07 DIAGNOSIS — I25110 Atherosclerotic heart disease of native coronary artery with unstable angina pectoris: Secondary | ICD-10-CM

## 2019-10-07 DIAGNOSIS — Z882 Allergy status to sulfonamides status: Secondary | ICD-10-CM | POA: Insufficient documentation

## 2019-10-07 DIAGNOSIS — K219 Gastro-esophageal reflux disease without esophagitis: Secondary | ICD-10-CM | POA: Diagnosis not present

## 2019-10-07 DIAGNOSIS — M069 Rheumatoid arthritis, unspecified: Secondary | ICD-10-CM | POA: Insufficient documentation

## 2019-10-07 DIAGNOSIS — F419 Anxiety disorder, unspecified: Secondary | ICD-10-CM | POA: Diagnosis not present

## 2019-10-07 DIAGNOSIS — F329 Major depressive disorder, single episode, unspecified: Secondary | ICD-10-CM | POA: Diagnosis not present

## 2019-10-07 DIAGNOSIS — F17210 Nicotine dependence, cigarettes, uncomplicated: Secondary | ICD-10-CM | POA: Diagnosis not present

## 2019-10-07 DIAGNOSIS — K589 Irritable bowel syndrome without diarrhea: Secondary | ICD-10-CM | POA: Diagnosis not present

## 2019-10-07 DIAGNOSIS — M797 Fibromyalgia: Secondary | ICD-10-CM | POA: Insufficient documentation

## 2019-10-07 HISTORY — PX: CORONARY STENT INTERVENTION: CATH118234

## 2019-10-07 HISTORY — PX: LEFT HEART CATH AND CORONARY ANGIOGRAPHY: CATH118249

## 2019-10-07 LAB — POCT ACTIVATED CLOTTING TIME
Activated Clotting Time: 279 seconds
Activated Clotting Time: 318 seconds

## 2019-10-07 SURGERY — LEFT HEART CATH AND CORONARY ANGIOGRAPHY
Anesthesia: LOCAL

## 2019-10-07 MED ORDER — NITROGLYCERIN 1 MG/10 ML FOR IR/CATH LAB
INTRA_ARTERIAL | Status: AC
  Filled 2019-10-07: qty 10

## 2019-10-07 MED ORDER — VERAPAMIL HCL 2.5 MG/ML IV SOLN
INTRAVENOUS | Status: AC
  Filled 2019-10-07: qty 2

## 2019-10-07 MED ORDER — SODIUM CHLORIDE 0.9% FLUSH: 3.0000 mL | INTRAVENOUS | Status: DC | PRN

## 2019-10-07 MED ORDER — HEPARIN SODIUM (PORCINE) 1000 UNIT/ML IJ SOLN
INTRAMUSCULAR | Status: AC
  Filled 2019-10-07: qty 1

## 2019-10-07 MED ORDER — TICAGRELOR 90 MG PO TABS: 90.0000 mg | ORAL_TABLET | Freq: Two times a day (BID) | ORAL | 3 refills | Status: DC

## 2019-10-07 MED ORDER — FENTANYL CITRATE (PF) 100 MCG/2ML IJ SOLN
INTRAMUSCULAR | Status: AC
  Filled 2019-10-07: qty 2

## 2019-10-07 MED ORDER — SODIUM CHLORIDE 0.9 % WEIGHT BASED INFUSION
3.0000 mL/kg/h | INTRAVENOUS | Status: AC
  Administered 2019-10-07: 3 mL/kg/h via INTRAVENOUS

## 2019-10-07 MED ORDER — TICAGRELOR 90 MG PO TABS
ORAL_TABLET | ORAL | Status: DC | PRN
  Administered 2019-10-07: 180 mg via ORAL

## 2019-10-07 MED ORDER — MIDAZOLAM HCL 2 MG/2ML IJ SOLN
INTRAMUSCULAR | Status: AC
  Filled 2019-10-07: qty 2

## 2019-10-07 MED ORDER — HEPARIN (PORCINE) IN NACL 1000-0.9 UT/500ML-% IV SOLN
INTRAVENOUS | Status: DC | PRN
  Administered 2019-10-07: 500 mL

## 2019-10-07 MED ORDER — TICAGRELOR 90 MG PO TABS: 90.0000 mg | ORAL_TABLET | Freq: Two times a day (BID) | ORAL | Status: DC

## 2019-10-07 MED ORDER — FENTANYL CITRATE (PF) 100 MCG/2ML IJ SOLN
INTRAMUSCULAR | Status: DC | PRN
Start: 2019-10-07 — End: 2019-10-07
  Administered 2019-10-07 (×2): 50 ug via INTRAVENOUS
  Administered 2019-10-07 (×2): 25 ug via INTRAVENOUS

## 2019-10-07 MED ORDER — ACETAMINOPHEN 325 MG PO TABS: 650.0000 mg | ORAL_TABLET | ORAL | Status: DC | PRN

## 2019-10-07 MED ORDER — ASPIRIN 81 MG PO CHEW: 81.0000 mg | CHEWABLE_TABLET | Freq: Every day | ORAL | Status: DC

## 2019-10-07 MED ORDER — LIDOCAINE HCL (PF) 1 % IJ SOLN
INTRAMUSCULAR | Status: AC
  Filled 2019-10-07: qty 30

## 2019-10-07 MED ORDER — HYDRALAZINE HCL 20 MG/ML IJ SOLN: 10.0000 mg | INTRAMUSCULAR | Status: DC | PRN

## 2019-10-07 MED ORDER — SODIUM CHLORIDE 0.9 % IV SOLN: 250.0000 mL | INTRAVENOUS | Status: DC | PRN

## 2019-10-07 MED ORDER — TICAGRELOR 90 MG PO TABS
ORAL_TABLET | ORAL | Status: AC
  Filled 2019-10-07: qty 2

## 2019-10-07 MED ORDER — SODIUM CHLORIDE 0.9% FLUSH: 3.0000 mL | Freq: Two times a day (BID) | INTRAVENOUS | Status: DC

## 2019-10-07 MED ORDER — HEPARIN (PORCINE) IN NACL 1000-0.9 UT/500ML-% IV SOLN
INTRAVENOUS | Status: AC
  Filled 2019-10-07: qty 1000

## 2019-10-07 MED ORDER — ONDANSETRON HCL 4 MG/2ML IJ SOLN: 4.0000 mg | Freq: Four times a day (QID) | INTRAMUSCULAR | Status: DC | PRN

## 2019-10-07 MED ORDER — MIDAZOLAM HCL 2 MG/2ML IJ SOLN
INTRAMUSCULAR | Status: DC | PRN
  Administered 2019-10-07 (×2): 1 mg via INTRAVENOUS
  Administered 2019-10-07: 2 mg via INTRAVENOUS

## 2019-10-07 MED ORDER — IOHEXOL 350 MG/ML SOLN
INTRAVENOUS | Status: DC | PRN
  Administered 2019-10-07: 165 mL

## 2019-10-07 MED ORDER — SODIUM CHLORIDE 0.9 % WEIGHT BASED INFUSION: 1.0000 mL/kg/h | INTRAVENOUS | Status: DC

## 2019-10-07 MED ORDER — LIDOCAINE HCL (PF) 1 % IJ SOLN
INTRAMUSCULAR | Status: DC | PRN
  Administered 2019-10-07: 2 mL

## 2019-10-07 MED ORDER — VERAPAMIL HCL 2.5 MG/ML IV SOLN
INTRAVENOUS | Status: DC | PRN
  Administered 2019-10-07: 10 mL via INTRA_ARTERIAL

## 2019-10-07 MED ORDER — ASPIRIN 81 MG PO CHEW: 81.0000 mg | CHEWABLE_TABLET | ORAL | Status: DC

## 2019-10-07 MED ORDER — SODIUM CHLORIDE 0.9 % IV SOLN: INTRAVENOUS | Status: DC

## 2019-10-07 MED ORDER — LABETALOL HCL 5 MG/ML IV SOLN: 10.0000 mg | INTRAVENOUS | Status: DC | PRN

## 2019-10-07 MED ORDER — HEPARIN SODIUM (PORCINE) 1000 UNIT/ML IJ SOLN
INTRAMUSCULAR | Status: DC | PRN
  Administered 2019-10-07: 6000 [IU] via INTRAVENOUS
  Administered 2019-10-07: 2000 [IU] via INTRAVENOUS
  Administered 2019-10-07: 4000 [IU] via INTRAVENOUS

## 2019-10-07 MED FILL — BRILINTA 90 MG TABLET: 90 | 30 days supply | Qty: 60 | Fill #0

## 2019-10-07 SURGICAL SUPPLY — 19 items
BALLN SAPPHIRE 2.0X12 (BALLOONS) ×2
BALLN SAPPHIRE ~~LOC~~ 2.75X15 (BALLOONS) ×1 IMPLANT
BALLOON SAPPHIRE 2.0X12 (BALLOONS) IMPLANT
CATH 5FR JL3.5 JR4 ANG PIG MP (CATHETERS) ×1 IMPLANT
CATH VISTA GUIDE 6FR XB3 (CATHETERS) ×1 IMPLANT
DEVICE RAD COMP TR BAND LRG (VASCULAR PRODUCTS) ×1 IMPLANT
GLIDESHEATH SLEND SS 6F .021 (SHEATH) ×1 IMPLANT
GUIDEWIRE INQWIRE 1.5J.035X260 (WIRE) IMPLANT
INQWIRE 1.5J .035X260CM (WIRE) ×2
KIT ENCORE 26 ADVANTAGE (KITS) ×1 IMPLANT
KIT HEART LEFT (KITS) ×2 IMPLANT
PACK CARDIAC CATHETERIZATION (CUSTOM PROCEDURE TRAY) ×2 IMPLANT
STENT RESOLUTE ONYX 2.75X12 (Permanent Stent) ×1 IMPLANT
STENT RESOLUTE ONYX 2.75X18 (Permanent Stent) ×1 IMPLANT
SYR MEDRAD MARK 7 150ML (SYRINGE) ×2 IMPLANT
TRANSDUCER W/STOPCOCK (MISCELLANEOUS) ×2 IMPLANT
TUBING CIL FLEX 10 FLL-RA (TUBING) ×2 IMPLANT
WIRE COUGAR XT STRL 190CM (WIRE) ×1 IMPLANT
WIRE HI TORQ VERSACORE-J 145CM (WIRE) ×1 IMPLANT

## 2019-10-07 NOTE — Interval H&P Note (Signed)
History and Physical Interval Note:  10/07/2019 8:05 AM  Laurie [name]  has presented today for surgery, with the diagnosis of angina - cad.  The various methods of treatment have been discussed with the patient and family. After consideration of risks, benefits and other options for treatment, the patient has consented to  Procedure(s): LEFT HEART CATH AND CORONARY ANGIOGRAPHY (N/A) as a surgical intervention.  The patient's history has been reviewed, patient examined, no change in status, stable for surgery.  I have reviewed the patient's chart and labs.  Questions were answered to the patient's satisfaction.    Cath Lab Visit (complete for each Cath Lab visit)  Clinical Evaluation Leading to the Procedure:   ACS: No.  Non-ACS:    Anginal Classification: CCS III  Anti-ischemic medical therapy: No Therapy  Non-Invasive Test Results: No non-invasive testing performed  Prior CABG: No previous CABG        Lauree Chandler

## 2019-10-07 NOTE — Discharge Summary (Signed)
Discharge Summary for Same Day PCI   Patient ID: Laurie [name] MRN: [id number]; DOB: [birth year]  Admit date: 10/07/2019 Discharge date: 10/07/2019  Primary Care Provider: [name], MD  Primary Cardiologist:Dr. Angelena Form  Discharge Diagnoses    Active Problems:   Coronary artery disease involving native coronary artery of native heart with unstable angina pectoris ([place])  HLD  HTN  Diagnostic Studies/Procedures    Cardiac Catheterization 10/07/2019:  Burnell Blanks, MD (Primary)    Procedures  CORONARY STENT INTERVENTION  LEFT HEART CATH AND CORONARY ANGIOGRAPHY  Conclusion    Mid RCA lesion is 50% stenosed.  2nd Mrg lesion is 80% stenosed.  Mid Cx lesion is 90% stenosed.  Prox LAD lesion is 40% stenosed.  A drug-eluting stent was successfully placed using a [place] [id number].  Post intervention, there is a 0% residual stenosis.  A drug-eluting stent was successfully placed using a [place] [id number].  Post intervention, there is a 0% residual stenosis.  The left ventricular systolic function is normal.  LV end diastolic pressure is normal.  There is no mitral valve regurgitation.  The left ventricular ejection fraction is 55-65% by visual estimate.   1. Mild non-obstructive disease in the proximal to mid LAD 2. Severe stenosis mid Circumflex into OM1 3. Successful PTCA/DES x 2 Mid Circumflex into OM1 4. Moderate non-obstructive disease in the mid RCA 5. Normal LV systolic function.   Recommendations: Continue DAPT with ASA and Brilinta for at least 6 months post stenting.  Continue statin. Consider beta blocker therapy at follow up. Same day discharge post PCI if stable.   Diagnostic Dominance: Right  Intervention    History of Present Illness     Laurie [name] is a 64 y.o. female with hx of hypertension,chronic back pain, COPD,  polysubstance abuse, hyperlipidemia, rheumatoid arthritis presented for  cardiac cath.   Echo in 2019 with LVEF 65%, mild MR. Cardiac cath in 2011 with mild CAD. Chronically abnormal EKG. Last seen in our office in 2014 by Dr. Johnsie Cancel.   Noted few month hx of exertional chest pain and shortness of breath. Symptoms worrisome for unstable angina. Cardiac catheterization was arranged for further evaluation.  Hospital Course     The patient underwent cardiac cath as noted above with severe stenosis mid Circumflex into OM1 s/p PTCA and DES x2. Medical therapy for mild non obstructive disease for LAD and RCA. Normal LV function. Plan for DAPT with ASA/Brilinta for at least 6 months. The patient was seen by cardiac rehab while in short stay. There were no observed complications post cath. Radial cath site was re-evaluated prior to discharge and found to be stable without any complications. Instructions/precautions regarding cath site care were given prior to discharge.  Laurie [name] was seen by Dr. Angelena Form and determined stable for discharge home. Follow up with our office has been arranged. Medications are listed below. Pertinent changes include addition of Brilinta.   Consider BB as outpatient and statin up titration.   _____________  Cath/PCI Registry Performance & Quality Measures: 1. Aspirin prescribed? - Yes 2. ADP Receptor Inhibitor (Plavix/Clopidogrel, Brilinta/Ticagrelor or Effient/Prasugrel) prescribed (includes medically managed patients)? - Yes 3. High Intensity Statin (Lipitor 40-80mg  or Crestor 20-40mg ) prescribed? - Yes - Plan up titration as outpatient 4. For EF <40%, was ACEI/ARB prescribed? - Not Applicable (EF >/= 24%) 5. For EF <40%, Aldosterone Antagonist (Spironolactone or Eplerenone) prescribed? - Not Applicable (EF >/= 58%) 6. Cardiac Rehab Phase II ordered (Included  Medically managed Patients)? - Yes    Discharge Vitals Blood pressure (!) 150/64, pulse 62, temperature 98.4 F (36.9 C), temperature source Oral, resp. rate 13, height 5'  10" (1.778 m), weight 76.2 kg, SpO2 97 %.  Filed Weights   10/07/19 0709  Weight: 76.2 kg    Last Labs & Radiologic Studies   _____________  CARDIAC CATHETERIZATION  Addendum Date: 10/07/2019    Mid RCA lesion is 50% stenosed.  2nd Mrg lesion is 80% stenosed.  Mid Cx lesion is 90% stenosed.  Prox LAD lesion is 40% stenosed.  A drug-eluting stent was successfully placed using a [place] [id number].  Post intervention, there is a 0% residual stenosis.  A drug-eluting stent was successfully placed using a [place] [id number].  Post intervention, there is a 0% residual stenosis.  The left ventricular systolic function is normal.  LV end diastolic pressure is normal.  There is no mitral valve regurgitation.  The left ventricular ejection fraction is 55-65% by visual estimate.  1. Mild non-obstructive disease in the proximal to mid LAD 2. Severe stenosis mid Circumflex into OM1 3. Successful PTCA/DES x 2 Mid Circumflex into OM1 4. Moderate non-obstructive disease in the mid RCA 5. Normal LV systolic function. Recommendations: Continue DAPT with ASA and Brilinta for at least 6 months post stenting. Continue statin. Consider beta blocker therapy at follow up. Same day discharge post PCI if stable.   Result Date: 10/07/2019  Mid RCA lesion is 50% stenosed.  2nd Mrg lesion is 80% stenosed.  Mid Cx lesion is 90% stenosed.  Prox LAD lesion is 40% stenosed.  A drug-eluting stent was successfully placed using a [place] [id number].  Post intervention, there is a 0% residual stenosis.  A drug-eluting stent was successfully placed using a [place] [id number].  Post intervention, there is a 0% residual stenosis.  The left ventricular systolic function is normal.  LV end diastolic pressure is normal.  There is no mitral valve regurgitation.  The left ventricular ejection fraction is 55-65% by visual estimate.  1. Mild non-obstructive disease in the proximal to mid  LAD 2. Severe stenosis mid Circumflex into OM1 3. Successful PTCA/DES x 2 Mid Circumflex into OM1 4. Moderate non-obstructive disease in the mid RCA 5. Normal LV systolic function. Recommendations: Continue DAPT with ASA and Brilinta for at least 6 months post stenting. Continue statin. Consider beta blocker therapy at follow up. Ame    Disposition   Pt is being discharged home today in good condition.  Follow-up Plans & Appointments     Follow-up Information    Liliane Shi, PA-C. Go on 10/19/2019.   Specialties: Cardiology, Physician Assistant Why: @8 :45am for cath follow up  Contact information: [address] [place] [address] [phone]              Discharge Instructions    Amb Referral to Cardiac Rehabilitation   Complete by: As directed    Diagnosis: Coronary Stents   After initial evaluation and assessments completed: Virtual Based Care may be provided alone or in conjunction with Phase 2 Cardiac Rehab based on patient barriers.: Yes       Discharge Medications   Allergies as of 10/07/2019      Reactions   Codeine Nausea And Vomiting   Lisinopril Cough   Sulfonamide Derivatives Nausea And Vomiting   Tetracyclines & Related Rash      Medication List    STOP taking these  medications   ibuprofen 800 MG tablet Commonly known as: ADVIL   ondansetron 4 MG disintegrating tablet Commonly known as: Zofran ODT   polyethylene glycol 17 g packet Commonly known as: MIRALAX / GLYCOLAX   Vitamin D3 1.25 MG (50000 UT) Caps     TAKE these medications   aspirin EC 81 MG tablet Take 1 tablet (81 mg total) by mouth daily. Swallow whole.   atorvastatin 20 MG tablet Commonly known as: LIPITOR Take 1 tablet (20 mg total) by mouth daily.   BIOFREEZE EX Apply 1 application topically daily as needed (back pain).   ticagrelor 90 MG Tabs tablet Commonly known as: Brilinta Take 1 tablet (90 mg total) by mouth 2 (two) times daily.     venlafaxine XR 150 MG 24 hr capsule Commonly known as: EFFEXOR-XR Take 1 capsule (150 mg total) by mouth daily with breakfast.          Allergies Allergies  Allergen Reactions  . Codeine Nausea And Vomiting  . Lisinopril Cough  . Sulfonamide Derivatives Nausea And Vomiting  . Tetracyclines & Related Rash    Outstanding Labs/Studies   None  Duration of Discharge Encounter   Greater than 30 minutes including physician time.  [place] Longest [place], PA 10/07/2019, 2:14 PM

## 2019-10-07 NOTE — Progress Notes (Signed)
Pt states she is having difficulty breathing, pt is deep breathing, SPO2 is 100% all VSS, Vin PA / card was paged, he will come and assess but in the meantime give her coffee// caffine to counter balance the effect of new med/ Brillinta. Pt currently sipping coffee, reassurance given, panic subsiding.

## 2019-10-07 NOTE — Discharge Instructions (Signed)
INCREASE FLUIDS FOR 3 DAYS KEEP RIGHT WRIST ELEVATED HEART LEVEL 24 HOURS  Radial Site Care  This sheet gives you information about how to care for yourself after your procedure. Your health care provider may also give you more specific instructions. If you have problems or questions, contact your health care provider. What can I expect after the procedure? After the procedure, it is common to have:  Bruising and tenderness at the catheter insertion area. Follow these instructions at home: Medicines  Take over-the-counter and prescription medicines only as told by your health care provider. Insertion site care  Follow instructions from your health care provider about how to take care of your insertion site. Make sure you: ? Wash your hands with soap and water before you change your bandage (dressing). If soap and water are not available, use hand sanitizer. ? Change your dressing as told by your health care provider. ? Leave stitches (sutures), skin glue, or adhesive strips in place. These skin closures may need to stay in place for 2 weeks or longer. If adhesive strip edges start to loosen and curl up, you may trim the loose edges. Do not remove adhesive strips completely unless your health care provider tells you to do that.  Check your insertion site every day for signs of infection. Check for: ? Redness, swelling, or pain. ? Fluid or blood. ? Pus or a bad smell. ? Warmth.  Do not take baths, swim, or use a hot tub until your health care provider approves.  You may shower 24-48 hours after the procedure, or as directed by your health care provider. ? Remove the dressing and gently wash the site with plain soap and water. ? Pat the area dry with a clean towel. ? Do not rub the site. That could cause bleeding.  Do not apply powder or lotion to the site. Activity   For 24 hours after the procedure, or as directed by your health care provider: ? Do not flex or bend the affected  arm. ? Do not push or pull heavy objects with the affected arm. ? Do not drive yourself home from the hospital or clinic. You may drive 24 hours after the procedure unless your health care provider tells you not to. ? Do not operate machinery or power tools.  Do not lift anything that is heavier than 10 lb (4.5 kg), or the limit that you are told, until your health care provider says that it is safe.  Ask your health care provider when it is okay to: ? Return to work or school. ? Resume usual physical activities or sports. ? Resume sexual activity. General instructions  If the catheter site starts to bleed, raise your arm and put firm pressure on the site. If the bleeding does not stop, get help right away. This is a medical emergency.  If you went home on the same day as your procedure, a responsible adult should be with you for the first 24 hours after you arrive home.  Keep all follow-up visits as told by your health care provider. This is important. Contact a health care provider if:  You have a fever.  You have redness, swelling, or yellow drainage around your insertion site. Get help right away if:  You have unusual pain at the radial site.  The catheter insertion area swells very fast.  The insertion area is bleeding, and the bleeding does not stop when you hold steady pressure on the area.  Your arm or  hand becomes pale, cool, tingly, or numb. These symptoms may represent a serious problem that is an emergency. Do not wait to see if the symptoms will go away. Get medical help right away. Call your local emergency services (911 in the [place] Do not drive yourself to the hospital. Summary  After the procedure, it is common to have bruising and tenderness at the site.  Follow instructions from your health care provider about how to take care of your radial site wound. Check the wound every day for signs of infection.  Do not lift anything that is heavier than 10 lb (4.5  kg), or the limit that you are told, until your health care provider says that it is safe. This information is not intended to replace advice given to you by your health care provider. Make sure you discuss any questions you have with your health care provider. Document Revised: 03/05/2017 Document Reviewed: 03/05/2017 Elsevier Patient Education  2020 [employer].

## 2019-10-07 NOTE — Progress Notes (Signed)
[id number] Came at 1105 to educate but pt too sleepy.  Returned now and pt more awake but still drowsy. Discussed importance of brilinta with stent. Reviewed NTG use. Encouraged smoking cessation and gave handout. Encouraged to call 1800 quit now if needed. Pt stated she has nicotine patches. Left heart healthy diet and written ex guidelines. Pt stated she is limited in walking by back issues so I modified guidelines. Discussed CRP 2 and referred to [facility] program.  Encouraged pt to read materials at home when more awake and stressed again the importance of brilinta and of notifying cardiologist if any reoccurring CP. Graylon Good RN BSN 10/07/2019 2:15 PM

## 2019-10-08 ENCOUNTER — Encounter (HOSPITAL_COMMUNITY): Payer: Self-pay | Admitting: Cardiovascular Disease

## 2019-10-08 ENCOUNTER — Telehealth: Payer: Self-pay | Admitting: Cardiovascular Disease

## 2019-10-08 MED FILL — Nitroglycerin IV Soln 100 MCG/ML in D5W: INTRA_ARTERIAL | Qty: 10 | Status: AC

## 2019-10-08 NOTE — Telephone Encounter (Signed)
    Pt said she is returning call, someone called her today. She also wanted to ask if she can remove bandages on her wrist

## 2019-10-08 NOTE — Telephone Encounter (Signed)
Returned call to patient. She still had what she described as a splint on her right wrist.  I instructed to to remove.  She had a bandage underneath the splint that she also removed.  States site just has very small area w dried blood on it.  Will review aftercare instructions that she was discharged with and will call if any new concerns.    I gave her the number for cardiac rehab, they may have tried to reach her.

## 2019-10-12 ENCOUNTER — Telehealth (HOSPITAL_COMMUNITY): Payer: Self-pay

## 2019-10-12 NOTE — Telephone Encounter (Signed)
Attempted to call patient in regards to Cardiac Rehab - LM on VM 

## 2019-10-12 NOTE — Telephone Encounter (Signed)
Pt insurance is active and benefits verified through [facility] Medicare. Co-pay $0.00, DED $0.00/$0.00 met, out of pocket $3,600.00/$414.27 met, co-insurance 0%. No pre-authorization required. Passport, 10/12/19 @ 1:52PM, IDH#[id number]  Will contact patient to see if she is interested in the Cardiac Rehab Program. If interested, patient will need to complete follow up appt. Once completed, patient will be contacted for scheduling upon review by the RN Navigator.

## 2019-10-18 NOTE — Progress Notes (Signed)
Cardiology Office Note:    Date:  10/19/2019   ID:  [name], DOB Mar 21, 1955, MRN [id number]  PCP:  [name], MD  [facility] Cardiologist:  No primary care provider on file.  [facility] Electrophysiologist:  None   Referring MD: [name], MD   Chief Complaint:  S/p Cath with DES to LCx and DES to [place]  Patient Profile:    Laurie [name] is a 64 y.o. female with:   Coronary artery disease   S/p DES to LCx and DES to OM1 in 8/21  COPD/asthma  Rheumatoid arthritis   Anxiety  Hypertension   Hyperlipidemia IBS  Tobacco use  Fibromyalgia  GERD  Prior CV studies: Cardiac catheterization 10/15/2019 LAD proximal 40 LCx mid 90; OM2 80 RCA mid 50 EF 55-65 PCI: 2.75 x 18 mm Resolute Onyx DES to the mid LCx PCI: 2.75 x 12 mm Resolute Onyx DES to the OM2  Echocardiogram 11/06/2017 Mild LVH, EF 65-70, normal wall motion, mild MR, normal RVSF, mild TR  History of Present Illness:    Laurie [name] was last seen in clinic by Dr. Angelena Form in 09/2019.  She had symptoms of chest discomfort with minimal exertion concerning for unstable angina pectoris.  Cardiac catheterization was arranged.  This demonstrated mild nonobstructive disease in the proximal to mid LAD, moderate nonobstructive disease in the mid RCA and severe stenosis in the mid LCx leading into the OM1.  She underwent PCI with DES to the mid LCx and DES to the OM1.  She was discharged home the same day as her PCI.  She returns for follow-up.   She reports since discharge she has had some shortness of breath. She noted about 4 days ago she was having some trouble breathing when she was laying down that seemed to be better sitting up. She also feels shortness of breath on exertion. Prior to stenting she had dyspnea on exertion at baseline but feels since PCI it has been a little worse. Also is a smoker and has only had a couple cigarettes after discharge. She is using the nicotine patch. She  did read SOB can be a side affect from Brilinta so wondered if that could be causing it. Overall shortness of breath is tolerable. Also had some chest pain that seemed to be more musculoskeletal in nature. She has history of fibromylagia and MSK pain. It was a throbbing pain at rest that was very brief, less than a minute. Also she is TTP on exam. BP 140/70, re-check 128/72. She has had some elevated pressures recently, she is not on any antihypertensives. Labs still show elevated LDL and TG, she is on Atorvastatin 20 mg which can be increased.   Past Medical History:  Diagnosis Date   Allergy    Anxiety    Arthritis    RA   Asthma    Chronic back pain    COPD (chronic obstructive pulmonary disease) (HCC)    Depression    Esophageal stricture    GERD (gastroesophageal reflux disease)    Heart murmur    Hematemesis 09/10/2018   Hyperlipidemia    Hypertension    IBS (irritable bowel syndrome)    Interstitial cystitis    Neuromuscular disorder (HCC)    fibromyalgia    Current Medications: Current Meds  Medication Sig   aspirin EC 81 MG tablet Take 1 tablet (81 mg total) by mouth daily. Swallow whole.   atorvastatin (LIPITOR) 40 MG tablet Take 1 tablet (  40 mg total) by mouth daily.   Menthol, Topical Analgesic, (BIOFREEZE EX) Apply 1 application topically daily as needed (back pain).   ticagrelor (BRILINTA) 90 MG TABS tablet Take 1 tablet (90 mg total) by mouth 2 (two) times daily.   venlafaxine XR (EFFEXOR-XR) 150 MG 24 hr capsule Take 1 capsule (150 mg total) by mouth daily with breakfast.   [DISCONTINUED] atorvastatin (LIPITOR) 20 MG tablet Take 1 tablet (20 mg total) by mouth daily.     Allergies:   Codeine, Lisinopril, Sulfonamide derivatives, and Tetracyclines & related   Social History   Tobacco Use   Smoking status: Current Every Day Smoker    Packs/day: 0.50    Types: Cigarettes   Smokeless tobacco: Never Used   Tobacco comment: trying to quit    Vaping Use   Vaping Use: Never used  Substance Use Topics   Alcohol use: No    Alcohol/week: 0.0 standard drinks   Drug use: No     Family Hx: The patient's family history includes Aneurysm in her brother; Cancer in her brother; Coronary artery disease in her father, paternal aunt, and paternal grandmother; Dementia in her mother; Esophageal cancer in her brother; Heart attack in her father; Stomach cancer in her paternal aunt. There is no history of Rectal cancer or Colon cancer.  ROS +sob, +chest pain   EKGs/Labs/Other Test Reviewed:    EKG:  EKG is  ordered today.  The ekg ordered today demonstrates NSR, 72 bpm, nonspecific TW changes V4-V6  Recent Labs: 09/03/2019: ALT 14; TSH 0.15 09/27/2019: BUN 12; Creatinine, Ser 0.92; Hemoglobin 13.1; Platelets 283; Potassium 4.2; Sodium 142   Recent Lipid Panel Lab Results  Component Value Date/Time   CHOL 159 09/03/2019 01:53 PM   TRIG 227 (H) 09/03/2019 01:53 PM   HDL 46 (L) 09/03/2019 01:53 PM   CHOLHDL 3.5 09/03/2019 01:53 PM   LDLCALC 81 09/03/2019 01:53 PM   LDLDIRECT 123.0 03/14/2017 09:48 AM    Physical Exam:    VS:  BP 140/70    Pulse 72    Ht 5\' 10"  (1.778 m)    Wt 169 lb (76.7 kg)    SpO2 96%    BMI 24.25 kg/m     Wt Readings from Last 3 Encounters:  10/19/19 169 lb (76.7 kg)  10/07/19 168 lb (76.2 kg)  09/27/19 168 lb 11.2 oz (76.5 kg)     Physical Exam  General: Well developed, well nourished, NAD  HEENT: OP clear, mucus membranes moist  SKIN: warm, dry. No rashes. Neuro: No focal deficits  Musculoskeletal: Muscle strength 5/5 all ext  Psychiatric: Mood and affect normal  Neck: No JVD, no carotid bruits, no thyromegaly, no lymphadenopathy.  Lungs:minimal wheezing Cardiovascular: Regular rate and rhythm. No murmurs, gallops or rubs. Abdomen:Soft. Bowel sounds present. Non-tender.  Extremities: No lower extremity edema. Pulses are 2 + in the bilateral DP/PT.   ASSESSMENT & PLAN:    CAD s/p recent  PCI Patient was started on ASA and Brilinta. Overall doing well, cath site is stable. She has had some sob starting 4 days ago that she feels is worse than before PCI, however is a chronic smoker and very deconditioned. She feels sob is tolerable so recommended continue Brilinta and continue to monitor symptoms, might improve overtime. If it gets worse or becomes intolerable to call and can switch to Plavix. Also had some brief L-sided chest wall pain, not similar to prior PCI. TTP on exam today. Will send in some  SL nitro.   Hypertension Re-check BP better, 128/72. Recommended patient buy bp cuff to take Bps daily for a week and call in to report readings. Low threshold to start antihypertensives.   Hyperlipidemia Labs still not at goal. Will increase atorvastatin to 40 mg daily and re-check labs in 4-6 weeks.   Tobacco use Patient had a couple cigarettes after discharge but now using the nicotine patch. Encouraged cessation.   Dispo: Dr. Angelena Form in 3-4 months  Medication Adjustments/Labs and Tests Ordered: Current medicines are reviewed at length with the patient today.  Concerns regarding medicines are outlined above.  Tests Ordered: Orders Placed This Encounter  Procedures   Lipid panel   Hepatic function panel   EKG 12-Lead   Medication Changes: Meds ordered this encounter  Medications   atorvastatin (LIPITOR) 40 MG tablet    Sig: Take 1 tablet (40 mg total) by mouth daily.    Dispense:  90 tablet    Refill:  3    Signed, Cadence Jorene Minors 10/19/2019 9:56 AM    [facility] [place], [place],   [address] Phone: [phone]; Fax: [phone]

## 2019-10-19 ENCOUNTER — Encounter: Payer: Self-pay | Admitting: Physician Assistant

## 2019-10-19 ENCOUNTER — Ambulatory Visit (INDEPENDENT_AMBULATORY_CARE_PROVIDER_SITE_OTHER): Payer: Medicare Other | Admitting: Medical

## 2019-10-19 ENCOUNTER — Other Ambulatory Visit: Payer: Self-pay

## 2019-10-19 ENCOUNTER — Telehealth: Payer: Self-pay | Admitting: Medical

## 2019-10-19 VITALS — BP 140/70 | HR 72 | Ht 70.0 in | Wt 169.0 lb

## 2019-10-19 DIAGNOSIS — E782 Mixed hyperlipidemia: Secondary | ICD-10-CM

## 2019-10-19 DIAGNOSIS — I2510 Atherosclerotic heart disease of native coronary artery without angina pectoris: Secondary | ICD-10-CM | POA: Diagnosis not present

## 2019-10-19 DIAGNOSIS — F17200 Nicotine dependence, unspecified, uncomplicated: Secondary | ICD-10-CM

## 2019-10-19 DIAGNOSIS — I10 Essential (primary) hypertension: Secondary | ICD-10-CM | POA: Diagnosis not present

## 2019-10-19 MED ORDER — ATORVASTATIN CALCIUM 40 MG PO TABS: 40.0000 mg | ORAL_TABLET | Freq: Every day | ORAL | 3 refills | Status: DC

## 2019-10-19 MED ORDER — NITROGLYCERIN 0.4 MG SL SUBL: 0.4000 mg | SUBLINGUAL_TABLET | SUBLINGUAL | 1 refills | Status: DC | PRN

## 2019-10-19 NOTE — Patient Instructions (Addendum)
Medication Instructions:  Your physician has recommended you make the following change in your medication:   1) Increase Atorvastatin to 40 mg, 1 tablet by mouth once a day  *If you need a refill on your cardiac medications before your next appointment, please call your pharmacy*  Lab Work: Your physician recommends that you return for lab work in 6 weeks on 11/30/19. **The lab is open from 7:30AM-4:30PM** You may come fasting anytime between those hours  If you have labs (blood work) drawn today and your tests are completely normal, you will receive your results only by: Marland Kitchen MyChart Message (if you have MyChart) OR . A paper copy in the mail If you have any lab test that is abnormal or we need to change your treatment, we will call you to review the results.  Testing/Procedures: None ordered today  Follow-Up: On 02/28/2020 at 4:00PM with Lauree Chandler, MD  Other Instructions Check your blood pressure once a day for 1 week and call with readings

## 2019-10-19 NOTE — Telephone Encounter (Signed)
[name] is calling stating her boyfriend received a call advising her to callback in regards to medication, but I was unable to find documentation of who it was that called. Please advise.

## 2019-10-19 NOTE — Telephone Encounter (Signed)
I called and spoke with patient, she is aware per Cadence Furth to start Nitroglycerin as needed for chest pain. Advised patient on how to use Nitroglycerin, patient aware that if after second dose to call 911 if chest pain has not subsided. Patient verbalized understanding and thanked me for the call.

## 2019-10-24 ENCOUNTER — Other Ambulatory Visit: Payer: Self-pay | Admitting: Family Medicine

## 2019-11-01 ENCOUNTER — Encounter (HOSPITAL_COMMUNITY): Payer: Self-pay

## 2019-11-01 ENCOUNTER — Telehealth (HOSPITAL_COMMUNITY): Payer: Self-pay

## 2019-11-01 NOTE — Telephone Encounter (Signed)
Attempted to call patient in regards to Cardiac Rehab - LM on VM  Mailed lette

## 2019-11-15 NOTE — Chronic Care Management (AMB) (Deleted)
Chronic Care Management Pharmacy  Name: Laurie [name]  MRN: [id number] DOB: Jun 30, 1955   Chief Complaint/ HPI  Laurie [name],  64 y.o. , female presents for their Initial CCM visit with the clinical pharmacist In office.  PCP : [name], MD Patient Care Team: [name], MD as PCP - General (Family Medicine) Earnie Larsson, [facility] as Pharmacist (Pharmacist)  Their chronic conditions include: Hypertension, Hyperlipidemia, Coronary Artery Disease, Depression, Tobacco use and Chronic Pain   Office Visits: 09/03/19: Patient presented to Dr. Elease Hashimoto for follow-up. LDL improved to 81, TSH low.   Consult Visit: 10/19/19: Patient presented to Cadence Kathlen Mody, PA-C for follow-up. Atorvastatin increased to 40 mg, patient started on NG. Patient with continued shortness of breath.  10/07/19: Patient hospitalized for cardiac cath and PCI 08/29/19: Patient presented to ED for radiculopathy. Discharged with ibuprofen, oxycodone, prednisone, and ondansetron.    Allergies  Allergen Reactions  . Codeine Nausea And Vomiting  . Lisinopril Cough  . Sulfonamide Derivatives Nausea And Vomiting  . Tetracyclines & Related Rash    Medications: Outpatient Encounter Medications as of 11/17/2019  Medication Sig  . aspirin EC 81 MG tablet Take 1 tablet (81 mg total) by mouth daily. Swallow whole.  Marland Kitchen atorvastatin (LIPITOR) 40 MG tablet Take 1 tablet (40 mg total) by mouth daily.  . Menthol, Topical Analgesic, (BIOFREEZE EX) Apply 1 application topically daily as needed (back pain).  . nitroGLYCERIN (NITROSTAT) 0.4 MG SL tablet Place 1 tablet (0.4 mg total) under the tongue every 5 (five) minutes as needed for chest pain.  . ticagrelor (BRILINTA) 90 MG TABS tablet Take 1 tablet (90 mg total) by mouth 2 (two) times daily.  Marland Kitchen venlafaxine XR (EFFEXOR-XR) 150 MG 24 hr capsule TAKE ONE CAPSULE BY MOUTH EVERY MORNING WITH BREAKFAST   No facility-administered encounter medications on  file as of 11/17/2019.    Wt Readings from Last 3 Encounters:  10/19/19 169 lb (76.7 kg)  10/07/19 168 lb (76.2 kg)  09/27/19 168 lb 11.2 oz (76.5 kg)    Current Diagnosis/Assessment:    Goals Addressed   None    Hypertension   BP goal is:  {CHL HP UPSTREAM Pharmacist BP ranges:[phone]}  Office blood pressures are  BP Readings from Last 3 Encounters:  10/19/19 140/70  10/07/19 (!) 139/50  09/27/19 (!) 144/78   Patient checks BP at home {CHL HP BP Monitoring [phone]} Patient home BP readings are ranging: ***  Patient has failed these meds in the past: Bisoprolol, HCTZ, Clonidine, Losartan, Nebivolol Patient is currently {CHL Controlled/Uncontrolled:[phone]} on the following medications:  . None  We discussed {CHL HP Upstream Pharmacy discussion:[id number]}  Plan  Continue {CHL HP Upstream Pharmacy [phone]}   Hyperlipidemia   S/p PCI LCx and OM2 (10/07/19)  LDL goal < ***  Lipid Panel     Component Value Date/Time   CHOL 159 09/03/2019 1353   TRIG 227 (H) 09/03/2019 1353   HDL 46 (L) 09/03/2019 1353   LDLCALC 81 09/03/2019 1353   LDLDIRECT 123.0 03/14/2017 0948    Hepatic Function Latest Ref Rng & Units 09/03/2019 07/23/2019 09/28/2018  Total Protein 6.1 - 8.1 g/dL 6.9 7.0 7.0  Albumin 3.5 - 5.2 g/dL - 4.2 4.2  AST 10 - 35 U/L 9(L) 11 12  ALT 6 - 29 U/L 14 13 15   Alk Phosphatase 39 - 117 U/L - 76 81  Total Bilirubin 0.2 - 1.2 mg/dL 0.5 0.3 0.3  Bilirubin, Direct 0.0 - 0.2 mg/dL  0.1 - -     The 10-year ASCVD risk score Mikey Bussing DC Jr., et al., 2013) is: 14.1%   Values used to calculate the score:     Age: 26 years     Sex: Female     Is Non-Hispanic African American: No     Diabetic: No     Tobacco smoker: Yes     Systolic Blood Pressure: 222 mmHg     Is BP treated: Yes     HDL Cholesterol: 46 mg/dL     Total Cholesterol: 159 mg/dL   Patient has failed these meds in past: Pravastatin, Rosuvastatin Patient is currently {CHL  Controlled/Uncontrolled:[phone]} on the following medications:  . Atorvastatin 40 mg daily  . Aspirin 81 mg daily  . Ticagrelor 90 mg twice daily   We discussed:  {CHL HP Upstream Pharmacy [phone]}  Shortness of breath?   Plan  Continue {CHL HP Upstream Pharmacy LNLGX:[id number]}  Depression / Anxiety   PHQ9 Score:  No flowsheet data found. GAD7 Score: No flowsheet data found.  Patient has failed these meds in past: *** Patient is currently {CHL Controlled/Uncontrolled:[phone]} on the following medications:  . Venlafaxine XR 150 mg daily   We discussed:  ***  Plan  Continue {CHL HP Upstream Pharmacy XKGYJ:[id number]}  Tobacco Abuse   Tobacco Status:  Social History   Tobacco Use  Smoking Status Current Every Day Smoker  . Packs/day: 0.50  . Types: Cigarettes  Smokeless Tobacco Never Used  Tobacco Comment   trying to quit     Patient smokes {Time to first cigarette:23873} Patient triggers include: {Smoking Triggers:23882} On a scale of 1-10, reports MOTIVATION to quit is *** On a scale of 1-10, reports CONFIDENCE in quitting is ***  Previous quit attempts included: *** Patient is currently {CHL Controlled/Uncontrolled:[phone]} on the following medications:  . Nicotine Patch   We discussed:  {Smoking Cessation Counseling:23883}  Plan  Continue {CHL HP Upstream Pharmacy Plans:[phone]}  Misc / OTC   Patient has failed these meds in past: *** Patient is currently {CHL Controlled/Uncontrolled:[phone]} on the following medications:  . BioFreeze Ex daily PRN  . Nitroglycerin 0.4 mg PRN   We discussed:  ***  Plan  Continue {CHL HP Upstream Pharmacy Plans:[phone]}  Recommend repeated TSH   Medication Management   Pt uses *** pharmacy for all medications Uses pill box? {Yes or If no, why not?:20788} Pt endorses ***% compliance  We discussed: {Pharmacy options:24294}  Plan  {US Pharmacy OVZC:58850}    Follow  up: *** month phone visit  ***

## 2019-11-16 ENCOUNTER — Telehealth: Payer: Self-pay

## 2019-11-16 DIAGNOSIS — R7989 Other specified abnormal findings of blood chemistry: Secondary | ICD-10-CM

## 2019-11-16 NOTE — Progress Notes (Signed)
Chronic Care Management Pharmacy Assistant   Name: Laurie [name]  MRN: [id number] DOB: [birth year]  Reason for Encounter: Medication Review/initial question for initial visit with clinical pharmacist.    PCP : [name], MD  Allergies:   Allergies  Allergen Reactions  . Codeine Nausea And Vomiting  . Lisinopril Cough  . Sulfonamide Derivatives Nausea And Vomiting  . Tetracyclines & Related Rash    Medications: Outpatient Encounter Medications as of 11/16/2019  Medication Sig  . aspirin EC 81 MG tablet Take 1 tablet (81 mg total) by mouth daily. Swallow whole.  Marland Kitchen atorvastatin (LIPITOR) 40 MG tablet Take 1 tablet (40 mg total) by mouth daily.  . Menthol, Topical Analgesic, (BIOFREEZE EX) Apply 1 application topically daily as needed (back pain).  . nitroGLYCERIN (NITROSTAT) 0.4 MG SL tablet Place 1 tablet (0.4 mg total) under the tongue every 5 (five) minutes as needed for chest pain.  . ticagrelor (BRILINTA) 90 MG TABS tablet Take 1 tablet (90 mg total) by mouth 2 (two) times daily.  Marland Kitchen venlafaxine XR (EFFEXOR-XR) 150 MG 24 hr capsule TAKE ONE CAPSULE BY MOUTH EVERY MORNING WITH BREAKFAST   No facility-administered encounter medications on file as of 11/16/2019.    Current Diagnosis: Patient Active Problem List   Diagnosis Date Noted  . Coronary artery disease involving native coronary artery of native heart with unstable angina pectoris ([place])   . Substance abuse ([place]) 07/23/2019  . Renal insufficiency 09/10/2018  . Elevated troponin 09/10/2018  . Leukocytosis 09/10/2018  . Hematemesis 09/09/2018  . Opiate withdrawal ([place]) 09/09/2018  . Vitamin D deficiency 06/10/2018  . Shortness of breath 11/05/2017  . Elevated brain natriuretic peptide (BNP) level 11/05/2017  . Polysubstance (excluding opioids) dependence, daily use ([place]) 11/05/2017  . AKI (acute kidney injury) ([place]) 11/05/2017  . Low serum vitamin D 08/23/2015  . Hyperlipidemia 11/08/2014  . Reflux  esophagitis 03/23/2014  . Dysphagia, pharyngoesophageal phase 03/23/2014  . Pain in the chest 03/23/2014  . Special screening for malignant neoplasms, colon 07/09/2013  . Chest pain 04/23/2012  . Dyspnea 04/23/2012  . Murmur 04/23/2012  . INTERSTITIAL CYSTITIS 01/01/2010  . SWELLING, NECK 01/01/2010  . CAD, NATIVE VESSEL 12/19/2009  . Palpitations 11/21/2009  . ABNORMAL CV (STRESS) TEST 11/21/2009  . Essential hypertension 10/31/2009  . ELECTROCARDIOGRAM, ABNORMAL 10/25/2009  . PLANTAR FASCIITIS, BILATERAL 10/12/2009  . CERVICAL RADICULOPATHY 07/04/2008  . Pain in soft tissues of limb 03/25/2008  . DEGENERATIVE DISC DISEASE, CERVICAL SPINE, W/RADICULOPATHY 09/08/2007  . LOW BACK PAIN 09/08/2007  . TOBACCO USE 03/23/2007  . OSTEOARTHROSIS, LOCAL NOS, LOWER LEG 11/07/2006  . STATE, SYMPTOMATIC MENOPAUSE/FEM CLIMACTERIC 10/27/2006  . Depression, recurrent ([place]) 09/15/2006  . Thoracic or lumbosacral neuritis or radiculitis, unspecified 09/15/2006  . IBS 09/09/2006  . STRICTURE, ESOPHAGEAL 03/16/2001      Follow-Up:  Pharmacist Review   Have you seen any other providers since your last visit? no Any changes in your medications or health? no Any side effects from any medications? Yes  Patient states she has been having shortness of breath and dizziness since she started Brilinta.   Patient reports she is taking Effexor, and it seems it is not helping with her depression.  Do you have an symptoms or problems not managed by your medications? no Any concerns about your health right now? Yes  Patient states she is having back pain, and leg numbness. Patient reports she had a MRI done, but she does not know the results.  Has  your provider asked that you check blood pressure, blood sugar, or follow special diet at home? Yes  Patient states her blood pressure been running high.  On 10/24/2019 it was 162/91.  On 10/25/2019 it was 158/85.  On 10/26/2019 it was 131/72.  On 10/27/2019 it  was 147/74.  On 10/28/2019 it was 151/71.  On 10/29/2019 it was 177/84. Do you get any type of exercise on a regular basis? no Can you think of a goal you would like to reach for your health? None ID Do you have any problems getting your medications? no Is there anything that you would like to discuss during the appointment?   Patient would like to discuss back pain, and leg numbness. Patient states she is still a smoker.Patient states she would like to discuss her thyroid being check again,and her hair falling out.  Please bring medications and supplements to appointment  [place] Pharmacist Assistant [phone]

## 2019-11-16 NOTE — Telephone Encounter (Signed)
Thyroid labs ordered

## 2019-11-16 NOTE — Addendum Note (Signed)
Addended by: [name] on: 11/16/2019 12:52 PM   Modules accepted: Orders

## 2019-11-17 ENCOUNTER — Telehealth: Payer: Self-pay | Admitting: Family Medicine

## 2019-11-17 ENCOUNTER — Ambulatory Visit: Payer: Medicare Other

## 2019-11-17 NOTE — Telephone Encounter (Signed)
The patient had an appointment today with the pharmacist and had a family emergency and had to cancel appointment.  I am not able to reschedule this appointment.  Please contact the patient to reschedule

## 2019-11-17 NOTE — Progress Notes (Signed)
  Chronic Care Management   Note  11/17/2019 Name: Laurie [name] MRN: [id number] DOB: 06/20/1955  Laurie [name] is a 64 y.o. year old female who is a primary care patient of Burchette, Alinda Sierras, MD. I reached out to [name] by phone today in response to a referral sent by [name]'s PCP, Burchette, Alinda Sierras, MD.   [name] was given information about Chronic Care Management services today including:  1. CCM service includes personalized support from designated clinical staff supervised by her physician, including individualized plan of care and coordination with other care providers 2. 24/7 contact phone numbers for assistance for urgent and routine care needs. 3. Service will only be billed when office clinical staff spend 20 minutes or more in a month to coordinate care. 4. Only one practitioner may furnish and bill the service in a calendar month. 5. The patient may stop CCM services at any time (effective at the end of the month) by phone call to the office staff.   Patient agreed to services and verbal consent obtained.   Follow up plan:   Carley Perdue [facility] Scheduler

## 2019-11-18 ENCOUNTER — Telehealth: Payer: Self-pay | Admitting: Cardiovascular Disease

## 2019-11-18 NOTE — Telephone Encounter (Signed)
New message    Patient had 3 stents put in 3wks ago.  Patient c/o feet swelling and dizziness for 2 days and her SOB is worse.  Pt states that she fell 2 days ago.  Not sure if her symptoms are from the stents or the fall.  Please advise

## 2019-11-18 NOTE — Telephone Encounter (Signed)
Returned call to Pt.  Per Pt she has had continued shortness of breath since she saw PA 10/19/19.  At that time PA encouraged Pt to continue Brilinta to see if she was able to tolerate it better over time.  Per Pt breathing has not improved and "feels like I am suffocating"  Pt reported a weeks worth of BP's- 162/91, 158/85, 131/72, 147/74, 151/71 and 177/84  Pt also c/o of ankle edema.  States she has never had that before.  Pt interested in changing from [place].  Will forward to Dr. Angelena Form and nurse.

## 2019-11-19 ENCOUNTER — Telehealth: Payer: Self-pay | Admitting: Family Medicine

## 2019-11-19 MED ORDER — CLOPIDOGREL BISULFATE 75 MG PO TABS: ORAL_TABLET | ORAL | 3 refills | Status: DC

## 2019-11-19 NOTE — Telephone Encounter (Signed)
Pt is calling in to see if she can get a refill on pantoprazole (PROTONIX) 40 MG Pharm:  [facility] [address]  (medication is not on current medication list)

## 2019-11-19 NOTE — Chronic Care Management (AMB) (Signed)
Chronic Care Management Pharmacy  Name: Laurie [name]  MRN: [id number] DOB: November 02, 1955   Chief Complaint/ HPI  Laurie [name],  64 y.o. , female presents for their Initial CCM visit with the clinical pharmacist In office.  PCP : Laurie Post, MD Patient Care Team: Laurie Post, MD as PCP - General (Family Medicine) Laurie [name], [facility] as Pharmacist (Pharmacist)  Their chronic conditions include: Hypertension, Hyperlipidemia, Coronary Artery Disease, Depression, Tobacco use and Chronic Pain   Office Visits: 09/03/19: Patient presented to Dr. Elease [name] for follow-up. LDL improved to 81, TSH low.   Consult Visit: 10/19/19: Patient presented to Laurie Kathlen Mody, PA-C for follow-up. Atorvastatin increased to 40 mg, patient started on NG. Patient with continued shortness of breath.  10/07/19: Patient hospitalized for cardiac cath and PCI 08/29/19: Patient presented to ED for radiculopathy. Discharged with ibuprofen, oxycodone, prednisone, and ondansetron.    Allergies  Allergen Reactions  . Codeine Nausea And Vomiting  . Lisinopril Cough  . Sulfonamide Derivatives Nausea And Vomiting  . Tetracyclines & Related Rash    Medications: Outpatient Encounter Medications as of 11/24/2019  Medication Sig  . atorvastatin (LIPITOR) 40 MG tablet Take 1 tablet (40 mg total) by mouth daily.  Marland Kitchen guaiFENesin (MUCINEX) 600 MG 12 hr tablet Take 600 mg by mouth 2 (two) times daily as needed.  . venlafaxine XR (EFFEXOR-XR) 150 MG 24 hr capsule TAKE ONE CAPSULE BY MOUTH EVERY MORNING WITH BREAKFAST  . aspirin EC 81 MG tablet Take 1 tablet (81 mg total) by mouth daily. Swallow whole. (Patient not taking: Reported on 11/24/2019)  . clopidogrel (PLAVIX) 75 MG tablet Take 4 tablets the first day then one tablet by mouth daily (Patient not taking: Reported on 11/24/2019)  . Menthol, Topical Analgesic, (BIOFREEZE EX) Apply 1 application topically daily as needed (back pain).  .  nitroGLYCERIN (NITROSTAT) 0.4 MG SL tablet Place 1 tablet (0.4 mg total) under the tongue every 5 (five) minutes as needed for chest pain.  . pantoprazole (PROTONIX) 40 MG tablet Take 1 tablet (40 mg total) by mouth daily. (Patient not taking: Reported on 11/24/2019)   No facility-administered encounter medications on file as of 11/24/2019.    Wt Readings from Last 3 Encounters:  10/19/19 169 lb (76.7 kg)  10/07/19 168 lb (76.2 kg)  09/27/19 168 lb 11.2 oz (76.5 kg)    Current Diagnosis/Assessment:  SDOH Interventions     Most Recent Value  SDOH Interventions  Financial Strain Interventions Intervention Not Indicated  Transportation Interventions Intervention Not Indicated      Goals Addressed            This Visit's Progress   . Chronic Care Management       CARE PLAN ENTRY (see longitudinal plan of care for additional care plan information)  Current Barriers:  . Chronic Disease Management support, education, and care coordination needs related to Hypertension, Hyperlipidemia, Coronary Artery Disease, Depression, Tobacco use and Chronic Pain    Hypertension BP Readings from Last 3 Encounters:  10/19/19 140/70  10/07/19 (!) 139/50  09/27/19 (!) 144/78   . Pharmacist Clinical Goal(s): o Over the next 90 days, patient will work with PharmD and providers to achieve BP goal <130/80 . Current regimen:  o None . Interventions: o Discussed low salt diet and exercising as tolerated extensively o Will initiate blood pressure monitoring plan  . Patient self care activities - Over the next 90 days, patient will: o Check Blood Pressure 2-3 times weekly,  document, and provide at future appointments o Ensure daily salt intake < 2300 mg/day  Hyperlipidemia Lab Results  Component Value Date/Time   LDLCALC 81 09/03/2019 01:53 PM   LDLDIRECT 123.0 03/14/2017 09:48 AM   . Pharmacist Clinical Goal(s): o Over the next 90 days, patient will work with PharmD and providers to  achieve LDL goal < 70 . Current regimen:  o Atorvastatin 40 mg daily . Interventions: o Discussed low cholesterol diet and exercising as tolerated extensively o Will initiate cholesterol monitoring plan   Medication management . Pharmacist Clinical Goal(s): o Over the next 90 days, patient will work with PharmD and providers to achieve optimal medication adherence . Current pharmacy: [facility] . Interventions o Comprehensive medication review performed. o Utilize [facility] for medication synchronization, packaging and delivery o Verbal consent obtained for [facility] enhanced pharmacy services (medication synchronization, adherence packaging, delivery coordination). A medication sync plan was created to allow patient to get all medications delivered once every 30 to 90 days per patient preference. Patient understands they have freedom to choose pharmacy and clinical pharmacist will coordinate care between all prescribers and [facility].  . Patient self care activities - Over the next 90 days, patient will: o Take medications as prescribed o Report any questions or concerns to PharmD and/or provider(s)      Hypertension   BP goal is:  <130/80  Office blood pressures are  BP Readings from Last 3 Encounters:  10/19/19 140/70  10/07/19 (!) 139/50  09/27/19 (!) 144/78   Patient checks BP at home weekly (has a friend with a BP cuff who will check)  Patient home BP readings are ranging: 180/90, typically 140s-150s when checking.  Patient has failed these meds in the past: Bisoprolol, HCTZ, Clonidine, Losartan, Nebivolol Patient is currently uncontrolled on the following medications:  . None  We discussed: Patient previously on blood pressure medications, unsure why they were stopped during her last hospitalization.   Plan  Recommend starting chlorthalidone 25 mg daily or losartan 50 mg daily based on patient preference.  Increase monitoring to  2-3 times weekly.  Hyperlipidemia   S/p PCI LCx and OM2 (10/07/19)  LDL goal < 70  Lipid Panel     Component Value Date/Time   CHOL 159 09/03/2019 1353   TRIG 227 (H) 09/03/2019 1353   HDL 46 (L) 09/03/2019 1353   LDLCALC 81 09/03/2019 1353   LDLDIRECT 123.0 03/14/2017 0948    Hepatic Function Latest Ref Rng & Units 09/03/2019 07/23/2019 09/28/2018  Total Protein 6.1 - 8.1 g/dL 6.9 7.0 7.0  Albumin 3.5 - 5.2 g/dL - 4.2 4.2  AST 10 - 35 U/L 9(L) 11 12  ALT 6 - 29 U/L 14 13 15   Alk Phosphatase 39 - 117 U/L - 76 81  Total Bilirubin 0.2 - 1.2 mg/dL 0.5 0.3 0.3  Bilirubin, Direct 0.0 - 0.2 mg/dL 0.1 - -     The 10-year ASCVD risk score Mikey Bussing DC Jr., et al., 2013) is: 15.2%   Values used to calculate the score:     Age: 89 years     Sex: Female     Is Non-Hispanic African American: No     Diabetic: No     Tobacco smoker: Yes     Systolic Blood Pressure: 270 mmHg     Is BP treated: Yes     HDL Cholesterol: 46 mg/dL     Total Cholesterol: 159 mg/dL   Patient has failed these meds in  past: Pravastatin, Rosuvastatin Patient is currently controlled on the following medications:  . Atorvastatin 40 mg daily  . Aspirin 81 mg daily  . Clopidogrel 75 mg daily  We discussed: Brillinta recently switched to clopidogrel due to shortness of breath and swelling, but patient had not yet picked up her clopidogrel. Instructed patient to continue Brillinta until she starts clopidogrel.   Patient stopped aspirin for past week, unsure if she should be taking with plavix or Brillinta. Counseled patient on the importance of both aspirin + clopidogrel at preventing clots and encouraged her to restart her aspirin.   Patient tolerating atorvastatin well.   Plan  Continue current medications  Depression / Anxiety   PHQ9 Score:  PHQ9 SCORE ONLY 11/24/2019  PHQ-9 Total Score 19   GAD7 Score: No flowsheet data found.  Patient has failed these meds in past: Xanax, Elavil, Wellbutrin,  Citalopram, Pristiq, Valium, Cymbalta  Patient is currently uncontrolled on the following medications:  . Venlafaxine XR 150 mg daily (started 2013)   We discussed:  Patient reports feeling more tearful and stressed since her heart surgery. She feels low energy and that her Venlafaxine is not working for her. Encouraged patient to schedule appointment with [facility].   Plan  Continue current medications until PCP visit on 11/29/19.   Tobacco Abuse   Tobacco Status:  Social History   Tobacco Use  Smoking Status Current Every Day Smoker  . Packs/day: 0.50  . Types: Cigarettes  Smokeless Tobacco Never Used  Tobacco Comment   trying to quit     Patient smokes After 30 minutes of waking Patient triggers include: stress On a scale of 1-10, reports MOTIVATION to quit is 8 On a scale of 1-10, reports CONFIDENCE in quitting is 5  Previous quit attempts included: Wellbutrin Patient is currently uncontrolled on the following medications:  . Nicotine Patch (Has not started).   We discussed:  Counseled on patch placement, side effects, and option to remove at night if they experience trouble sleeping or bad dreams.   Plan  Continue current medications  GERD   Patient with history of Esophageal Stricture Previously managed by Dr. Deatra Ina (Retired)   Patient reports dysphagia. Expresses understanding to avoid triggers such as citrus juices, fatty foods, large meals and tomato sauce.  Currently uncontrolled on: . Pantoprazole 40 mg daily (Ran Out)  Patient has not yet re-established with GI. Encouraged patient to do so.   Plan   Continue current medication.  Allergic Rhinitis   Patient has failed these meds in past: n/a Patient is currently controlled on the following medications:  Marland Kitchen Mucinex 600 mg twice daily PRN  We discussed:  Patient having significant congestion due to seasonal allergies. Previously had taken Flonase to help her symptoms, wants to know  if she can get a new Rx for it.   Plan  Recommend starting Flonase 50 mcg/act 2 spray daily PRN  Misc / OTC   . BioFreeze Ex daily PRN  . Nitroglycerin 0.4 mg PRN   Plan  Continue current medications  Medication Management   Pt uses [facility] for all medications Uses pill box? No - Hasn't purchased one yet. Pt endorses 70% compliance - Patient is not consistent about taking her medications at the same time every day and sometimes does forget to take her medications, although her Fiance will try to help remind her.  We discussed: Verbal consent obtained for [facility] enhanced pharmacy services (medication synchronization, adherence packaging, delivery coordination).  A medication sync plan was created to allow patient to get all medications delivered once every 30 to 90 days per patient preference. Patient understands they have freedom to choose pharmacy and clinical pharmacist will coordinate care between all prescribers and [facility].   Plan  Utilize [facility] pharmacy for medication synchronization, packaging and delivery  Follow up: 3 month phone visit  [facility] [place]

## 2019-11-19 NOTE — Telephone Encounter (Signed)
We can change her Brilinta to Plavix 75 mg daily and see if this helps. If her swelling worsens, please tell her to let us know. Thanks, Gerald Stabs

## 2019-11-19 NOTE — Telephone Encounter (Signed)
Reviewed with Dr Angelena Form and patient should taking loading dose of 300 mg Plavix and then 75 mg daily. I spoke with patient and gave her instructions on changing from Brilinta to Plavix.  Will [facility] to [facility] on [address].  Patient will let us know if shortness of breath or swelling continue.

## 2019-11-21 NOTE — Telephone Encounter (Signed)
Refill once and set up follow up to discuss.

## 2019-11-22 MED ORDER — PANTOPRAZOLE SODIUM 40 MG PO TBEC: 40.0000 mg | DELAYED_RELEASE_TABLET | Freq: Every day | ORAL | 0 refills | Status: DC

## 2019-11-22 NOTE — Telephone Encounter (Signed)
No answer at the pts cell number.  Rx sent with a note attached stating an appt is needed.

## 2019-11-23 ENCOUNTER — Telehealth: Payer: Self-pay

## 2019-11-23 NOTE — Progress Notes (Signed)
Spoke to patient to confirmed patient telephone appointment on 11/24/2019 for CCM at 9:00 AM with Junius Argyle the Clinical pharmacist.   Patient Verbalized understanding   [place] Pharmacist Assistant [phone]

## 2019-11-24 ENCOUNTER — Ambulatory Visit: Payer: Medicare Other

## 2019-11-24 ENCOUNTER — Telehealth (HOSPITAL_COMMUNITY): Payer: Self-pay

## 2019-11-24 MED ORDER — FLUTICASONE PROPIONATE 50 MCG/ACT NA SUSP: 2.0000 | Freq: Every day | NASAL | 6 refills | Status: DC

## 2019-11-24 MED ORDER — LOSARTAN POTASSIUM 50 MG PO TABS: 50.0000 mg | ORAL_TABLET | Freq: Every day | ORAL | 3 refills | Status: DC

## 2019-11-24 NOTE — Progress Notes (Signed)
I sent in the Losartan and Flonase.  I will discuss other issues with her at follow up and consider addition of diuretic at that time.   Thanks.

## 2019-11-24 NOTE — Addendum Note (Signed)
Addended by: [name] on: 11/24/2019 10:52 PM   Modules accepted: Orders

## 2019-11-24 NOTE — Telephone Encounter (Signed)
Unable to reach pt in regards to cardiac rehab. °Closed referral. °

## 2019-11-25 ENCOUNTER — Telehealth: Payer: Self-pay

## 2019-11-25 NOTE — Progress Notes (Signed)
Ok I will let her know about the medication changes. I would also recommend rechecking her BMP at your office visit. Thank you!    Doristine Section Clinical Pharmacist [facility] [place]

## 2019-11-25 NOTE — Telephone Encounter (Signed)
-----   Message from Germaine Pomfret, [facility] sent at 11/25/2019  8:21 AM EDT -----   ----- Message ----- From: [name], MD Sent: 11/24/2019  10:52 PM EDT To: Germaine Pomfret, [facility]  Alex,  I sent the Losartan and flonase and will discuss other medications with her at follow up.  Thanks!  Bruce ----- Message ----- From: Germaine Pomfret, [facility] Sent: 11/24/2019  10:58 AM EDT To: [name], MD  Dr. Elease Hashimoto,  Patient's blood pressure seem to be running high at home. She reports they are typically 140-150s and there was one instance where it was 180/90. I would recommend starting chlorthalidone 25 mg daily given its efficacy, but we could also restart losartan 50 mg daily which she was taking in the past. Her mood has also been worse the past few months. Her symptoms are moderate-severe according to her PHQ9 and she does not feel her Venlafaxine is working well. I encouraged her to set up an appointment with [facility]. Finally, she reports she has been having a lot of congestion due to seasonal allergies. She wanted to know if she could get a new Rx for Flonase nasal spray?  Of note, she has an office visit with you next week. Please let me know your thoughts on these recommendations and whether you want to start them now or wait until follow-up with her next week. Thanks, Doristine Section Clinical Pharmacist [facility] [place]

## 2019-11-25 NOTE — Progress Notes (Signed)
Laurie [name],   Please inform Ms. Macaulay that after discussion with Dr. Elease Hashimoto, he was in agreement with starting flonase nasal spray and losartan 50 mg 1 tablet daily. Both were sent into [facility] so we should be able to get them for her this Friday.   Please instruct her to continue checking her blood pressure 2-3 times per week and keep a log of it so you can discuss it with her. Plan for a CPA HTN Assessment in ~1 month.   Doristine Section Clinical Pharmacist [facility] [place]

## 2019-11-25 NOTE — Progress Notes (Signed)
Spoke with patient to inform her per clinical pharmacsit after discussion with Dr. Elease Hashimoto, he was in agreement with starting flonase nasal spray and losartan 50 mg 1 tablet daily. Both were sent into [facility] so we should be able to get them for her this Friday.   Please instruct her to continue checking her blood pressure 2-3 times per week and keep a log of it so you can discuss it with her. Plan for a CPA HTN Assessment in ~1 month.  Patient verbalized understanding. [place] Pharmacist Assistant [phone]

## 2019-11-29 ENCOUNTER — Encounter: Payer: Self-pay | Admitting: Family Medicine

## 2019-11-29 ENCOUNTER — Telehealth (INDEPENDENT_AMBULATORY_CARE_PROVIDER_SITE_OTHER): Payer: Medicare Other | Admitting: Family Medicine

## 2019-11-29 ENCOUNTER — Other Ambulatory Visit: Payer: Self-pay

## 2019-11-29 VITALS — Wt 170.0 lb

## 2019-11-29 DIAGNOSIS — J0100 Acute maxillary sinusitis, unspecified: Secondary | ICD-10-CM | POA: Diagnosis not present

## 2019-11-29 DIAGNOSIS — R7989 Other specified abnormal findings of blood chemistry: Secondary | ICD-10-CM

## 2019-11-29 MED ORDER — AMOXICILLIN-POT CLAVULANATE 875-125 MG PO TABS: 1.0000 | ORAL_TABLET | Freq: Two times a day (BID) | ORAL | 0 refills | Status: DC

## 2019-11-29 NOTE — Progress Notes (Signed)
Patient ID: Laurie [name], female   DOB: 11-19-1955, 64 y.o.   MRN: [id number]  This visit type was conducted due to national recommendations for restrictions regarding the COVID-19 pandemic in an effort to limit this patient's exposure and mitigate transmission in our community.   Virtual Visit via Telephone Note  I connected with [place] on 11/29/19 at  1:15 PM EDT by telephone and verified that I am speaking with the correct person using two identifiers.   I discussed the limitations, risks, security and privacy concerns of performing an evaluation and management service by telephone and the availability of in person appointments. I also discussed with the patient that there may be a patient responsible charge related to this service. The patient expressed understanding and agreed to proceed.  Location patient: home Location provider: work or home office Participants present for the call: patient, provider Patient did not have a visit in the prior 7 days to address this/these issue(s).   History of Present Illness: [name] called with sinus congestion and concern for "sinus infection" over the past week or so.  She states she has had some thick colored nasal discharge and occasional bloody nasal discharge as well as productive cough.  Possible low-grade fever.  Increased facial pain over her maxillary region bilaterally.  No sick exposures.  No dyspnea.  No loss of taste or smell.  She had J&J vaccine back in May.  She has allergies to sulfa and tetracycline.  Her recent history is that she had cardiac cath recently which showed 90% mid circumflex lesion which was successfully stented.  She has had no chest pain since then and feels her overall fatigue was improving following the cath.  She had some recent labs with slightly low TSH with normal free T4.  We suggested follow-up in 3 months to reassess   Observations/Objective: Patient sounds cheerful and well on the phone. I  do not appreciate any SOB. Speech and thought processing are grossly intact. Patient reported vitals:  Assessment and Plan:  Acute sinus symptoms.  Possible acute sinusitis.  We discussed the following  -We suggest that she consider Covid testing especially given her high risk status. -Agreed to go ahead and start Augmentin 875 mg twice daily with food for 10 days -Touch base for any persistent or worsening symptoms  Follow Up Instructions:  -As above.  She needs some follow-up labs for TSH and free T4   99441 5-10 99442 11-20 99443 21-30 I did not refer this patient for an OV in the next 24 hours for this/these issue(s).  I discussed the assessment and treatment plan with the patient. The patient was provided an opportunity to ask questions and all were answered. The patient agreed with the plan and demonstrated an understanding of the instructions.   The patient was advised to call back or seek an in-person evaluation if the symptoms worsen or if the condition fails to improve as anticipated.  I provided 23 minutes of non-face-to-face time during this encounter.   [name], MD

## 2019-11-30 ENCOUNTER — Other Ambulatory Visit: Payer: Medicare Other

## 2019-12-03 ENCOUNTER — Telehealth: Payer: Self-pay | Admitting: Cardiovascular Disease

## 2019-12-03 ENCOUNTER — Telehealth: Payer: Self-pay

## 2019-12-03 MED ORDER — ATORVASTATIN CALCIUM 40 MG PO TABS
40.0000 mg | ORAL_TABLET | Freq: Every day | ORAL | 3 refills | Status: DC
Start: 2019-12-03 — End: 2020-12-06

## 2019-12-03 MED ORDER — VENLAFAXINE HCL ER 150 MG PO CP24: ORAL_CAPSULE | ORAL | 3 refills | Status: DC

## 2019-12-03 MED ORDER — PANTOPRAZOLE SODIUM 40 MG PO TBEC
40.0000 mg | DELAYED_RELEASE_TABLET | Freq: Every day | ORAL | 0 refills | Status: DC
Start: 2019-12-03 — End: 2019-12-20

## 2019-12-03 MED ORDER — CLOPIDOGREL BISULFATE 75 MG PO TABS: ORAL_TABLET | ORAL | 3 refills | Status: DC

## 2019-12-03 NOTE — Telephone Encounter (Signed)
Pharmacy called in for a medication refill request    venlafaxine XR (EFFEXOR-XR) 150 MG 24 hr capsule   pantoprazole (PROTONIX) 40 MG tablet   [facility] - [place], [place] - [address]

## 2019-12-03 NOTE — Telephone Encounter (Signed)
*  STAT* If patient is at the pharmacy, call can be transferred to refill team.   1. Which medications need to be refilled? (please list name of each medication and dose if known)  atorvastatin (LIPITOR) 40 MG tablet  clopidogrel (PLAVIX) 75 MG tablet   2. Which pharmacy/location (including street and city if local pharmacy) is medication to be sent to? [facility] - [place], [place] - [address]  3. Do they need a 30 day or 90 day supply? 90 day supplies

## 2019-12-03 NOTE — Telephone Encounter (Signed)
Refills for Plavix and Atorvastatin sent to [facility], 90 days, per pt request.

## 2019-12-03 NOTE — Telephone Encounter (Signed)
Rxs sent

## 2019-12-10 ENCOUNTER — Ambulatory Visit: Payer: Medicare Other | Admitting: Family Medicine

## 2019-12-14 ENCOUNTER — Telehealth: Payer: Self-pay

## 2019-12-14 DIAGNOSIS — I10 Essential (primary) hypertension: Secondary | ICD-10-CM

## 2019-12-14 NOTE — Progress Notes (Signed)
Chronic Care Management Pharmacy Assistant   Name: Laurie [name]  MRN: [id number] DOB: 1955-05-28  Reason for Encounter: Medication Review   PCP : [name], MD  Allergies:   Allergies  Allergen Reactions  . Codeine Nausea And Vomiting  . Lisinopril Cough  . Sulfonamide Derivatives Nausea And Vomiting  . Tetracyclines & Related Rash    Medications: Outpatient Encounter Medications as of 12/14/2019  Medication Sig  . amoxicillin-clavulanate (AUGMENTIN) 875-125 MG tablet Take 1 tablet by mouth 2 (two) times daily.  Marland Kitchen aspirin EC 81 MG tablet Take 1 tablet (81 mg total) by mouth daily. Swallow whole.  Marland Kitchen atorvastatin (LIPITOR) 40 MG tablet Take 1 tablet (40 mg total) by mouth daily.  . clopidogrel (PLAVIX) 75 MG tablet Take 4 tablets the first day then one tablet by mouth daily  . fluticasone (FLONASE) 50 MCG/ACT nasal spray Place 2 sprays into both nostrils daily.  Marland Kitchen guaiFENesin (MUCINEX) 600 MG 12 hr tablet Take 600 mg by mouth 2 (two) times daily as needed.  Marland Kitchen losartan (COZAAR) 50 MG tablet Take 1 tablet (50 mg total) by mouth daily.  . Menthol, Topical Analgesic, (BIOFREEZE EX) Apply 1 application topically daily as needed (back pain).  . nitroGLYCERIN (NITROSTAT) 0.4 MG SL tablet Place 1 tablet (0.4 mg total) under the tongue every 5 (five) minutes as needed for chest pain.  . pantoprazole (PROTONIX) 40 MG tablet Take 1 tablet (40 mg total) by mouth daily.  Marland Kitchen venlafaxine XR (EFFEXOR-XR) 150 MG 24 hr capsule TAKE ONE CAPSULE BY MOUTH EVERY MORNING WITH BREAKFAST   No facility-administered encounter medications on file as of 12/14/2019.    Current Diagnosis: Patient Active Problem List   Diagnosis Date Noted  . Coronary artery disease involving native coronary artery of native heart with unstable angina pectoris ([place])   . Substance abuse ([place]) 07/23/2019  . Renal insufficiency 09/10/2018  . Elevated troponin 09/10/2018  . Leukocytosis 09/10/2018  .  Hematemesis 09/09/2018  . Opiate withdrawal ([place]) 09/09/2018  . Vitamin D deficiency 06/10/2018  . Shortness of breath 11/05/2017  . Elevated brain natriuretic peptide (BNP) level 11/05/2017  . Polysubstance (excluding opioids) dependence, daily use ([place]) 11/05/2017  . AKI (acute kidney injury) ([place]) 11/05/2017  . Low serum vitamin D 08/23/2015  . Hyperlipidemia 11/08/2014  . Reflux esophagitis 03/23/2014  . Dysphagia, pharyngoesophageal phase 03/23/2014  . Pain in the chest 03/23/2014  . Special screening for malignant neoplasms, colon 07/09/2013  . Chest pain 04/23/2012  . Dyspnea 04/23/2012  . Murmur 04/23/2012  . INTERSTITIAL CYSTITIS 01/01/2010  . SWELLING, NECK 01/01/2010  . CAD, NATIVE VESSEL 12/19/2009  . Palpitations 11/21/2009  . ABNORMAL CV (STRESS) TEST 11/21/2009  . Essential hypertension 10/31/2009  . ELECTROCARDIOGRAM, ABNORMAL 10/25/2009  . PLANTAR FASCIITIS, BILATERAL 10/12/2009  . CERVICAL RADICULOPATHY 07/04/2008  . Pain in soft tissues of limb 03/25/2008  . DEGENERATIVE DISC DISEASE, CERVICAL SPINE, W/RADICULOPATHY 09/08/2007  . LOW BACK PAIN 09/08/2007  . TOBACCO USE 03/23/2007  . OSTEOARTHROSIS, LOCAL NOS, LOWER LEG 11/07/2006  . STATE, SYMPTOMATIC MENOPAUSE/FEM CLIMACTERIC 10/27/2006  . Depression, recurrent ([place]) 09/15/2006  . Thoracic or lumbosacral neuritis or radiculitis, unspecified 09/15/2006  . IBS 09/09/2006  . STRICTURE, ESOPHAGEAL 03/16/2001    Follow-Up:  Pharmacist Review   Reviewed chart for medication changes ahead of medication coordination call.  No OVs, Consults, or hospital visits since last care coordination call/Pharmacist visit. (If appropriate, list visit date, provider name)  No medication changes indicated OR if  recent visit, treatment plan here.  BP Readings from Last 3 Encounters:  10/19/19 140/70  10/07/19 (!) 139/50  09/27/19 (!) 144/78    Lab Results  Component Value Date   HGBA1C 5.4 09/13/2018     Patient  obtains medications through Adherence Packaging  30 Days   Last adherence delivery included: (medication name and frequency)  None ID Patient declined (meds) last month due to PRN use/additional supply on hand.  None ID  Patient is due for next adherence delivery on: None ID . Called patient and reviewed medications and coordinated delivery.  This delivery to include:  Venlafaxine XR 150 mg Daily- Breakfast (3 Capsule on hand)  Coordinated acute fill for (med) to be delivered (date).  Venlafaxine XR 150 mg Daily- Breakfast (3 Capsule on hand) - 12/15/2019  Patient declined the following medications (meds) due to (reason)  Aspirin 81 mg Daily - Breakfast - OTC  Atorvastatin 40 mg Daily - Breakfast - adequate supply (patient states she  has  around 60 days left due to a 90 day fill).  Clopidogrel 75 mg Tablet- Breakfast- adequate supply (patient states she  has  around 60 days left due to a 90 day fill).  Pantoprazole 40 mg tablet - adequate supply  Losartan 50 mg tablet-Breakfast- adequate supply (patient states she has around two weeks on hand).  Flonase 50 mcg/act-PRN  Nitroglycerin 0.4 mg SL PRN  BioFreeze-OTC  Patient needs refills for None ID.  Confirmed delivery date of 12/15/2019, advised patient that pharmacy will contact them the morning of delivery.  [facility] Pharmacist Assistant [phone]

## 2019-12-20 ENCOUNTER — Other Ambulatory Visit: Payer: Self-pay | Admitting: Family Medicine

## 2019-12-24 ENCOUNTER — Telehealth: Payer: Self-pay | Admitting: Family Medicine

## 2019-12-24 ENCOUNTER — Other Ambulatory Visit: Payer: Self-pay | Admitting: *Deleted

## 2019-12-24 ENCOUNTER — Telehealth: Payer: Self-pay

## 2019-12-24 DIAGNOSIS — R7989 Other specified abnormal findings of blood chemistry: Secondary | ICD-10-CM

## 2019-12-24 NOTE — Telephone Encounter (Signed)
Spoke with patient. Thyroid labs ordered and lab appointment scheduled.

## 2019-12-24 NOTE — Progress Notes (Signed)
  Please void ,open in error.  [facility] Pharmacist Assistant [phone]

## 2019-12-24 NOTE — Telephone Encounter (Signed)
Pt call and stated dr.Burchette was to put in labs to check her thyroid  And need a call back.

## 2019-12-27 ENCOUNTER — Other Ambulatory Visit: Payer: Medicare Other

## 2019-12-27 ENCOUNTER — Other Ambulatory Visit: Payer: Self-pay

## 2019-12-27 DIAGNOSIS — R7989 Other specified abnormal findings of blood chemistry: Secondary | ICD-10-CM | POA: Diagnosis not present

## 2019-12-27 DIAGNOSIS — E0590 Thyrotoxicosis, unspecified without thyrotoxic crisis or storm: Secondary | ICD-10-CM | POA: Diagnosis not present

## 2019-12-28 ENCOUNTER — Telehealth: Payer: Self-pay

## 2019-12-28 DIAGNOSIS — R7989 Other specified abnormal findings of blood chemistry: Secondary | ICD-10-CM

## 2019-12-28 LAB — T4, FREE: Free T4: 2 ng/dL — ABNORMAL HIGH (ref 0.8–1.8)

## 2019-12-28 LAB — TSH: TSH: <0.01 mIU/L — ABNORMAL LOW (ref 0.40–4.50)

## 2019-12-28 NOTE — Telephone Encounter (Signed)
Patient aware of results and recommendations.  Referral placed.

## 2019-12-28 NOTE — Telephone Encounter (Signed)
-----   Message from [name], MD sent at 12/28/2019 12:45 PM EST ----- She is hyperthyroid.  Set up referral to endocrinology.

## 2020-01-05 ENCOUNTER — Encounter: Payer: Self-pay | Admitting: Family Medicine

## 2020-01-05 ENCOUNTER — Telehealth (INDEPENDENT_AMBULATORY_CARE_PROVIDER_SITE_OTHER): Payer: Medicare Other | Admitting: Family Medicine

## 2020-01-05 ENCOUNTER — Other Ambulatory Visit: Payer: Self-pay

## 2020-01-05 VITALS — Temp 99.9°F

## 2020-01-05 DIAGNOSIS — J0111 Acute recurrent frontal sinusitis: Secondary | ICD-10-CM

## 2020-01-05 DIAGNOSIS — E0590 Thyrotoxicosis, unspecified without thyrotoxic crisis or storm: Secondary | ICD-10-CM | POA: Diagnosis not present

## 2020-01-05 MED ORDER — AMOXICILLIN-POT CLAVULANATE 875-125 MG PO TABS
1.0000 | ORAL_TABLET | Freq: Two times a day (BID) | ORAL | 0 refills | Status: DC
Start: 2020-01-05 — End: 2020-02-17

## 2020-01-05 MED ORDER — ALBUTEROL SULFATE HFA 108 (90 BASE) MCG/ACT IN AERS: 2.0000 | INHALATION_SPRAY | Freq: Four times a day (QID) | RESPIRATORY_TRACT | 0 refills | Status: DC | PRN

## 2020-01-05 NOTE — Progress Notes (Signed)
Patient ID: Laurie [name], female   DOB: 09-06-1955, 64 y.o.   MRN: [id number]  This visit type was conducted due to national recommendations for restrictions regarding the COVID-19 pandemic in an effort to limit this patient's exposure and mitigate transmission in our community.   Virtual Visit via Telephone Note  I connected with [place] on 01/05/20 at  1:30 PM EST by telephone and verified that I am speaking with the correct person using two identifiers.   I discussed the limitations, risks, security and privacy concerns of performing an evaluation and management service by telephone and the availability of in person appointments. I also discussed with the patient that there may be a patient responsible charge related to this service. The patient expressed understanding and agreed to proceed.  Location patient: home Location provider: work or home office Participants present for the call: patient, provider Patient did not have a visit in the prior 7 days to address this/these issue(s).   History of Present Illness: [name] called with onset last week of some sinus pressure and postnasal drainage.  She has had occasional cough productive of yellow sputum.  She has some facial pain especially frontal and cheek region bilaterally.  She tried over-the-counter nasal spray without much improvement.  She denies any fever.  She has allergies to sulfa and tetracycline.  She has tolerated Augmentin in the past.  She does have a longstanding history of smoking.  She recently had abnormal thyroid labs with low TSH and mildly elevated free T4.  She has pending follow-up with endocrinology.  She has had some fatigue.  She has not been monitoring her pulse.  Increased anxiousness.  Past Medical History:  Diagnosis Date  . Allergy   . Anxiety   . Arthritis    RA  . Asthma   . Chronic back pain   . COPD (chronic obstructive pulmonary disease) ([place])   . Depression   . Esophageal stricture    . GERD (gastroesophageal reflux disease)   . Heart murmur   . Hematemesis 09/10/2018  . Hyperlipidemia   . Hypertension   . IBS (irritable bowel syndrome)   . Interstitial cystitis   . Neuromuscular disorder ([place])    fibromyalgia   Past Surgical History:  Procedure Laterality Date  . ABDOMINAL HYSTERECTOMY    . APPENDECTOMY    . BIOPSY  09/11/2018   Procedure: BIOPSY;  Surgeon: Rush Landmark Telford Nab., MD;  Location: [facility];  Service: Gastroenterology;;  . CERVICAL FUSION    . CERVICAL LAMINECTOMY    . CORONARY STENT INTERVENTION N/A 10/07/2019   Procedure: CORONARY STENT INTERVENTION;  Surgeon: Burnell Blanks, MD;  Location: [facility];  Service: Cardiovascular;  Laterality: N/A;  . ESOPHAGOGASTRODUODENOSCOPY    . ESOPHAGOGASTRODUODENOSCOPY (EGD) WITH PROPOFOL N/A 09/11/2018   Procedure: ESOPHAGOGASTRODUODENOSCOPY (EGD) WITH PROPOFOL;  Surgeon: Rush Landmark Telford Nab., MD;  Location: [facility];  Service: Gastroenterology;  Laterality: N/A;  . FINGER SURGERY    . LEFT HEART CATH AND CORONARY ANGIOGRAPHY N/A 10/07/2019   Procedure: LEFT HEART CATH AND CORONARY ANGIOGRAPHY;  Surgeon: Burnell Blanks, MD;  Location: [facility];  Service: Cardiovascular;  Laterality: N/A;  . LUMBAR FUSION    . SAVORY DILATION N/A 09/11/2018   Procedure: SAVORY DILATION;  Surgeon: Rush Landmark Telford Nab., MD;  Location: [facility];  Service: Gastroenterology;  Laterality: N/A;  . TONSILLECTOMY      reports that she has been smoking cigarettes. She has been smoking about 0.50 packs  per day. She has never used smokeless tobacco. She reports that she does not drink alcohol and does not use drugs. family history includes Aneurysm in her brother; Cancer in her brother; Coronary artery disease in her father, paternal aunt, and paternal grandmother; Dementia in her mother; Esophageal cancer in her brother; Heart attack in her father; Stomach cancer in her paternal  aunt. Allergies  Allergen Reactions  . Codeine Nausea And Vomiting  . Lisinopril Cough  . Sulfonamide Derivatives Nausea And Vomiting  . Tetracyclines & Related Rash      Observations/Objective: Patient sounds cheerful and well on the phone. I do not appreciate any SOB. Speech and thought processing are grossly intact. Patient reported vitals:  Assessment and Plan:  #1 probable acute sinusitis -Go and start Augmentin 875 mg twice daily with food -Stay well-hydrated -We sent in refill of her albuterol inhaler to use as needed for cough and wheeze  #2 hyperthyroidism from recent lab work. -Endocrinology referral has been made and patient is waiting to hear back -We have advised that she try to monitor pulse to make sure she is not having significant tachycardia -She will let us know if she has not heard from endocrinology office by next week  Follow Up Instructions:  -As above   99441 5-10 99442 11-20 99443 21-30 I did not refer this patient for an OV in the next 24 hours for this/these issue(s).  I discussed the assessment and treatment plan with the patient. The patient was provided an opportunity to ask questions and all were answered. The patient agreed with the plan and demonstrated an understanding of the instructions.   The patient was advised to call back or seek an in-person evaluation if the symptoms worsen or if the condition fails to improve as anticipated.  I provided 18 minutes of non-face-to-face time during this encounter.   [name], MD

## 2020-01-11 ENCOUNTER — Telehealth: Payer: Self-pay | Admitting: Pharmacist

## 2020-01-11 NOTE — Chronic Care Management (AMB) (Signed)
Chronic Care Management Pharmacy Assistant   Name: Laurie [name]  MRN: [id number] DOB: 03-15-1955  Reason for Encounter: Medication Review   PCP : [name], MD  Allergies:   Allergies  Allergen Reactions  . Codeine Nausea And Vomiting  . Lisinopril Cough  . Sulfonamide Derivatives Nausea And Vomiting  . Tetracyclines & Related Rash    Medications: Outpatient Encounter Medications as of 01/11/2020  Medication Sig  . albuterol (VENTOLIN HFA) 108 (90 Base) MCG/ACT inhaler Inhale 2 puffs into the lungs every 6 (six) hours as needed for wheezing or shortness of breath.  Marland Kitchen amoxicillin-clavulanate (AUGMENTIN) 875-125 MG tablet Take 1 tablet by mouth 2 (two) times daily.  Marland Kitchen aspirin EC 81 MG tablet Take 1 tablet (81 mg total) by mouth daily. Swallow whole.  Marland Kitchen atorvastatin (LIPITOR) 40 MG tablet Take 1 tablet (40 mg total) by mouth daily.  . clopidogrel (PLAVIX) 75 MG tablet Take 4 tablets the first day then one tablet by mouth daily  . fluticasone (FLONASE) 50 MCG/ACT nasal spray Place 2 sprays into both nostrils daily.  Marland Kitchen guaiFENesin (MUCINEX) 600 MG 12 hr tablet Take 600 mg by mouth 2 (two) times daily as needed.  Marland Kitchen losartan (COZAAR) 50 MG tablet Take 1 tablet (50 mg total) by mouth daily.  . Menthol, Topical Analgesic, (BIOFREEZE EX) Apply 1 application topically daily as needed (back pain).  . nitroGLYCERIN (NITROSTAT) 0.4 MG SL tablet Place 1 tablet (0.4 mg total) under the tongue every 5 (five) minutes as needed for chest pain.  . pantoprazole (PROTONIX) 40 MG tablet TAKE 1 TABLET BY MOUTH DAILY  . venlafaxine XR (EFFEXOR-XR) 150 MG 24 hr capsule TAKE ONE CAPSULE BY MOUTH EVERY MORNING WITH BREAKFAST   No facility-administered encounter medications on file as of 01/11/2020.    Current Diagnosis: Patient Active Problem List   Diagnosis Date Noted  . Coronary artery disease involving native coronary artery of native heart with unstable angina pectoris ([place])   .  Substance abuse ([place]) 07/23/2019  . Renal insufficiency 09/10/2018  . Elevated troponin 09/10/2018  . Leukocytosis 09/10/2018  . Hematemesis 09/09/2018  . Opiate withdrawal ([place]) 09/09/2018  . Vitamin D deficiency 06/10/2018  . Shortness of breath 11/05/2017  . Elevated brain natriuretic peptide (BNP) level 11/05/2017  . Polysubstance (excluding opioids) dependence, daily use ([place]) 11/05/2017  . AKI (acute kidney injury) ([place]) 11/05/2017  . Low serum vitamin D 08/23/2015  . Hyperlipidemia 11/08/2014  . Reflux esophagitis 03/23/2014  . Dysphagia, pharyngoesophageal phase 03/23/2014  . Pain in the chest 03/23/2014  . Special screening for malignant neoplasms, colon 07/09/2013  . Chest pain 04/23/2012  . Dyspnea 04/23/2012  . Murmur 04/23/2012  . INTERSTITIAL CYSTITIS 01/01/2010  . SWELLING, NECK 01/01/2010  . CAD, NATIVE VESSEL 12/19/2009  . Palpitations 11/21/2009  . ABNORMAL CV (STRESS) TEST 11/21/2009  . Essential hypertension 10/31/2009  . ELECTROCARDIOGRAM, ABNORMAL 10/25/2009  . PLANTAR FASCIITIS, BILATERAL 10/12/2009  . CERVICAL RADICULOPATHY 07/04/2008  . Pain in soft tissues of limb 03/25/2008  . DEGENERATIVE DISC DISEASE, CERVICAL SPINE, W/RADICULOPATHY 09/08/2007  . LOW BACK PAIN 09/08/2007  . TOBACCO USE 03/23/2007  . OSTEOARTHROSIS, LOCAL NOS, LOWER LEG 11/07/2006  . STATE, SYMPTOMATIC MENOPAUSE/FEM CLIMACTERIC 10/27/2006  . Depression, recurrent ([place]) 09/15/2006  . Thoracic or lumbosacral neuritis or radiculitis, unspecified 09/15/2006  . IBS 09/09/2006  . STRICTURE, ESOPHAGEAL 03/16/2001    Goals Addressed   None    Reviewed chart for medication changes ahead of medication coordination call.  Reviewed OVs, Consults, or hospital visits since last care coordination call/Pharmacist visit.  Medication changes indicated   01-05-2020 Amoxicillin-clavulanate (AUGMENTIN) 875-125 MG tablet for 10 days  BP Readings from Last 3 Encounters:  10/19/19 140/70   10/07/19 (!) 139/50  09/27/19 (!) 144/78    Lab Results  Component Value Date   HGBA1C 5.4 09/13/2018     Patient obtains medications through Adherence Packaging  30 Days   Last adherence delivery included:  . Losartan (COZAAR) 50 mg: one tablet with breakfast . Fluticasone (FLONASE) 50 MCG/ACT nasal spray: Place 2 sprays into both nostrils daily . Venlafaxine XR (EFFEXOR-XR) 150 mg: one capsule by mouth with breakfast  I spoke with the patient and review medications. There are no changes in medications currently. The patient is taking the following medications and she declined them due to supply on hand and PRN . Losartan (COZAAR) 50 mg: one tablet with breakfast . Fluticasone (FLONASE) 50 MCG/ACT nasal spray: Place 2 sprays into both nostrils daily . Venlafaxine XR (EFFEXOR-XR) 150 mg: one capsule by mouth with breakfast . Clopidogrel (PLAVIX) 75 mg: four (4) tablets the first day then one tablet by mouth daily . Atorvastatin (LIPITOR) 40 mg: one tablet at bedtime . Pantoprazole (PROTONIX) 40 mg: one tablet at breakfast . Aspirin EC 81 mg: one tablet at bedtime . Nitroglycerin (NITROSTAT) 0.4 MG SL: Place 1 tablet (0.4 mg total) under the tongue every 5 (five) minutes as needed for chest pain  She does not needed refill currently.  Confirmed delivery date of 01-29-2020, advised patient that pharmacy will contact them the morning of delivery. Follow-Up:  Coordination of Enhanced Pharmacy Services   Amilia ([place]) Mare Ferrari, [place] Assistant [phone]

## 2020-01-26 ENCOUNTER — Ambulatory Visit (INDEPENDENT_AMBULATORY_CARE_PROVIDER_SITE_OTHER): Payer: Medicare Other | Admitting: Otolaryngology

## 2020-02-07 ENCOUNTER — Telehealth: Payer: Self-pay | Admitting: Pharmacist

## 2020-02-07 NOTE — Chronic Care Management (AMB) (Signed)
Chronic Care Management Pharmacy Assistant   Name: Laurie [name]  MRN: [id number] DOB: 1955/05/07  Reason for Encounter: Medication Review  PCP : [name], MD  Allergies:   Allergies  Allergen Reactions  . Codeine Nausea And Vomiting  . Lisinopril Cough  . Sulfonamide Derivatives Nausea And Vomiting  . Tetracyclines & Related Rash    Medications: Outpatient Encounter Medications as of 02/07/2020  Medication Sig  . albuterol (VENTOLIN HFA) 108 (90 Base) MCG/ACT inhaler Inhale 2 puffs into the lungs every 6 (six) hours as needed for wheezing or shortness of breath.  Marland Kitchen amoxicillin-clavulanate (AUGMENTIN) 875-125 MG tablet Take 1 tablet by mouth 2 (two) times daily.  Marland Kitchen aspirin EC 81 MG tablet Take 1 tablet (81 mg total) by mouth daily. Swallow whole.  Marland Kitchen atorvastatin (LIPITOR) 40 MG tablet Take 1 tablet (40 mg total) by mouth daily.  . clopidogrel (PLAVIX) 75 MG tablet Take 4 tablets the first day then one tablet by mouth daily  . fluticasone (FLONASE) 50 MCG/ACT nasal spray Place 2 sprays into both nostrils daily.  Marland Kitchen guaiFENesin (MUCINEX) 600 MG 12 hr tablet Take 600 mg by mouth 2 (two) times daily as needed.  Marland Kitchen losartan (COZAAR) 50 MG tablet Take 1 tablet (50 mg total) by mouth daily.  . Menthol, Topical Analgesic, (BIOFREEZE EX) Apply 1 application topically daily as needed (back pain).  . nitroGLYCERIN (NITROSTAT) 0.4 MG SL tablet Place 1 tablet (0.4 mg total) under the tongue every 5 (five) minutes as needed for chest pain.  . pantoprazole (PROTONIX) 40 MG tablet TAKE 1 TABLET BY MOUTH DAILY  . venlafaxine XR (EFFEXOR-XR) 150 MG 24 hr capsule TAKE ONE CAPSULE BY MOUTH EVERY MORNING WITH BREAKFAST   No facility-administered encounter medications on file as of 02/07/2020.    Current Diagnosis: Patient Active Problem List   Diagnosis Date Noted  . Coronary artery disease involving native coronary artery of native heart with unstable angina pectoris ([place])   .  Substance abuse ([place]) 07/23/2019  . Renal insufficiency 09/10/2018  . Elevated troponin 09/10/2018  . Leukocytosis 09/10/2018  . Hematemesis 09/09/2018  . Opiate withdrawal ([place]) 09/09/2018  . Vitamin D deficiency 06/10/2018  . Shortness of breath 11/05/2017  . Elevated brain natriuretic peptide (BNP) level 11/05/2017  . Polysubstance (excluding opioids) dependence, daily use ([place]) 11/05/2017  . AKI (acute kidney injury) ([place]) 11/05/2017  . Low serum vitamin D 08/23/2015  . Hyperlipidemia 11/08/2014  . Reflux esophagitis 03/23/2014  . Dysphagia, pharyngoesophageal phase 03/23/2014  . Pain in the chest 03/23/2014  . Special screening for malignant neoplasms, colon 07/09/2013  . Chest pain 04/23/2012  . Dyspnea 04/23/2012  . Murmur 04/23/2012  . INTERSTITIAL CYSTITIS 01/01/2010  . SWELLING, NECK 01/01/2010  . CAD, NATIVE VESSEL 12/19/2009  . Palpitations 11/21/2009  . ABNORMAL CV (STRESS) TEST 11/21/2009  . Essential hypertension 10/31/2009  . ELECTROCARDIOGRAM, ABNORMAL 10/25/2009  . PLANTAR FASCIITIS, BILATERAL 10/12/2009  . CERVICAL RADICULOPATHY 07/04/2008  . Pain in soft tissues of limb 03/25/2008  . DEGENERATIVE DISC DISEASE, CERVICAL SPINE, W/RADICULOPATHY 09/08/2007  . LOW BACK PAIN 09/08/2007  . TOBACCO USE 03/23/2007  . OSTEOARTHROSIS, LOCAL NOS, LOWER LEG 11/07/2006  . STATE, SYMPTOMATIC MENOPAUSE/FEM CLIMACTERIC 10/27/2006  . Depression, recurrent ([place]) 09/15/2006  . Thoracic or lumbosacral neuritis or radiculitis, unspecified 09/15/2006  . IBS 09/09/2006  . STRICTURE, ESOPHAGEAL 03/16/2001    Goals Addressed   None    Reviewed chart for medication changes ahead of medication coordination call. No  OVs, Consults, or hospital visits since last care coordination call/Pharmacist visit. No medication changes indicated.  BP Readings from Last 3 Encounters:  10/19/19 140/70  10/07/19 (!) 139/50  09/27/19 (!) 144/78    Lab Results  Component Value Date    HGBA1C 5.4 09/13/2018     Patient obtains medications through Adherence Packaging  30 Days   Last adherence delivery included:  . Losartan (COZAAR) 50 mg: one tablet with breakfast . Fluticasone (FLONASE) 50 MCG/ACT nasal spray: Place 2 sprays into both nostrils daily . Venlafaxine XR (EFFEXOR-XR) 150 mg: one capsule by mouth with breakfast  Patient is due for next adherence delivery on: 02/14/2000. Called patient and reviewed medications and coordinated delivery. This delivery to include: . Losartan (COZAAR) 50 mg: one tablet with breakfast . Fluticasone (FLONASE) 50 MCG/ACT nasal spray: Place 2 sprays into both nostrils daily . Venlafaxine XR (EFFEXOR-XR) 150 mg: one capsule by mouth with breakfast . Pantoprazole (PROTONIX) 40 mg: one tablet at breakfast  I spoke with the patient and review medications. There are no changes in medications currently. She decline this medication due to supply on hand and PRN. The patient is taking the following medications:  Clopidogrel (PLAVIX) 75 mg: four (4) tablets the first day then one tablet by mouth daily  Atorvastatin (LIPITOR) 40 mg: one tablet at bedtime  Aspirin EC 81 mg: one tablet at bedtime  Nitroglycerin (NITROSTAT) 0.4 MG SL: Place 1 tablet (0.4 mg total) under the tongue every 5 (five) minutes as needed for chest pain   She currently does not need any refills. Confirmed delivery date of 02/14/2020, advised patient that pharmacy will contact them the morning of delivery.  Follow-Up:  Occupational hygienist

## 2020-02-17 ENCOUNTER — Ambulatory Visit: Payer: Medicare Other | Admitting: Endocrinology

## 2020-02-17 ENCOUNTER — Other Ambulatory Visit: Payer: Self-pay

## 2020-02-17 ENCOUNTER — Encounter: Payer: Self-pay | Admitting: Endocrinology

## 2020-02-17 DIAGNOSIS — E0590 Thyrotoxicosis, unspecified without thyrotoxic crisis or storm: Secondary | ICD-10-CM | POA: Diagnosis not present

## 2020-02-17 MED ORDER — METHIMAZOLE 10 MG PO TABS: 20.0000 mg | ORAL_TABLET | Freq: Two times a day (BID) | ORAL | 5 refills | Status: DC

## 2020-02-17 MED ORDER — METOPROLOL SUCCINATE ER 25 MG PO TB24: 25.0000 mg | ORAL_TABLET | Freq: Every day | ORAL | 5 refills | Status: DC

## 2020-02-17 NOTE — Patient Instructions (Addendum)
I have sent a prescription to your pharmacy, to slow the thyroid.  If ever you have fever while taking methimazole, stop it and call us, even if the reason is obvious, because of the risk of a rare side-effect.   I have also sent a prescription to your pharmacy, to help the blood pressure and slow the heart racing.   Please come back for a follow-up appointment in 2-3 weeks.

## 2020-02-17 NOTE — Progress Notes (Signed)
Subjective:    Patient ID: Laurie [name], female    DOB: 01/14/1956, 65 y.o.   MRN: [id number]  HPI Pt is referred by Dr Caryl Never, for hyperthyroidism.  Pt reports she was dx'ed with hyperthyroidism in 2021.  she has never been on therapy for this.  she has never had XRT to the anterior neck, or thyroid surgery.  she has never had thyroid imaging.  she does not consume kelp or any other non-prescribed thyroid medication.  she has never been on amiodarone.  She reports fatigue, diaphoresis, hair loss, muscle weakness, heat intolerance, palpitations, anxiety, doe, and tremor.  She has lost 13 lbs x 1 month (unintentional) Past Medical History:  Diagnosis Date  . Allergy   . Anxiety   . Arthritis    RA  . Asthma   . Chronic back pain   . COPD (chronic obstructive pulmonary disease) (HCC)   . Depression   . Esophageal stricture   . GERD (gastroesophageal reflux disease)   . Heart murmur   . Hematemesis 09/10/2018  . Hyperlipidemia   . Hypertension   . IBS (irritable bowel syndrome)   . Interstitial cystitis   . Neuromuscular disorder (HCC)    fibromyalgia    Past Surgical History:  Procedure Laterality Date  . ABDOMINAL HYSTERECTOMY    . APPENDECTOMY    . BIOPSY  09/11/2018   Procedure: BIOPSY;  Surgeon: Meridee Score Netty Starring., MD;  Location: [facility] ENDOSCOPY;  Service: Gastroenterology;;  . CERVICAL FUSION    . CERVICAL LAMINECTOMY    . CORONARY STENT INTERVENTION N/A 10/07/2019   Procedure: CORONARY STENT INTERVENTION;  Surgeon: Kathleene Hazel, MD;  Location: [facility] INVASIVE CV LAB;  Service: Cardiovascular;  Laterality: N/A;  . ESOPHAGOGASTRODUODENOSCOPY    . ESOPHAGOGASTRODUODENOSCOPY (EGD) WITH PROPOFOL N/A 09/11/2018   Procedure: ESOPHAGOGASTRODUODENOSCOPY (EGD) WITH PROPOFOL;  Surgeon: Meridee Score Netty Starring., MD;  Location: [facility] ENDOSCOPY;  Service: Gastroenterology;  Laterality: N/A;  . FINGER SURGERY    . LEFT HEART CATH AND CORONARY ANGIOGRAPHY N/A 10/07/2019    Procedure: LEFT HEART CATH AND CORONARY ANGIOGRAPHY;  Surgeon: Kathleene Hazel, MD;  Location: [facility] INVASIVE CV LAB;  Service: Cardiovascular;  Laterality: N/A;  . LUMBAR FUSION    . SAVORY DILATION N/A 09/11/2018   Procedure: SAVORY DILATION;  Surgeon: Meridee Score Netty Starring., MD;  Location: [facility] ENDOSCOPY;  Service: Gastroenterology;  Laterality: N/A;  . TONSILLECTOMY      Social History   Socioeconomic History  . Marital status: Single    Spouse name: Not on file  . Number of children: Not on file  . Years of education: Not on file  . Highest education level: Not on file  Occupational History  . Occupation: disability  Tobacco Use  . Smoking status: Current Every Day Smoker    Packs/day: 0.50    Types: Cigarettes  . Smokeless tobacco: Never Used  . Tobacco comment: trying to quit   Vaping Use  . Vaping Use: Never used  Substance and Sexual Activity  . Alcohol use: No    Alcohol/week: 0.0 standard drinks  . Drug use: No  . Sexual activity: Not on file  Other Topics Concern  . Not on file  Social History Narrative  . Not on file   Social Determinants of Health   Financial Resource Strain: Low Risk   . Difficulty of Paying Living Expenses: Not very hard  Food Insecurity: Not on file  Transportation Needs: No Transportation Needs  . Lack of Transportation (Medical):  No  . Lack of Transportation (Non-Medical): No  Physical Activity: Not on file  Stress: Not on file  Social Connections: Not on file  Intimate Partner Violence: Not on file    Current Outpatient Medications on File Prior to Visit  Medication Sig Dispense Refill  . albuterol (VENTOLIN HFA) 108 (90 Base) MCG/ACT inhaler Inhale 2 puffs into the lungs every 6 (six) hours as needed for wheezing or shortness of breath. 18 g 0  . aspirin EC 81 MG tablet Take 1 tablet (81 mg total) by mouth daily. Swallow whole. 90 tablet 3  . atorvastatin (LIPITOR) 40 MG tablet Take 1 tablet (40 mg total) by mouth daily. 90  tablet 3  . clopidogrel (PLAVIX) 75 MG tablet Take 4 tablets the first day then one tablet by mouth daily 90 tablet 3  . fluticasone (FLONASE) 50 MCG/ACT nasal spray Place 2 sprays into both nostrils daily. 16 g 6  . guaiFENesin (MUCINEX) 600 MG 12 hr tablet Take 600 mg by mouth 2 (two) times daily as needed.    Marland Kitchen losartan (COZAAR) 50 MG tablet Take 1 tablet (50 mg total) by mouth daily. 90 tablet 3  . Menthol, Topical Analgesic, (BIOFREEZE EX) Apply 1 application topically daily as needed (back pain).    . pantoprazole (PROTONIX) 40 MG tablet TAKE 1 TABLET BY MOUTH DAILY 30 tablet 0  . venlafaxine XR (EFFEXOR-XR) 150 MG 24 hr capsule TAKE ONE CAPSULE BY MOUTH EVERY MORNING WITH BREAKFAST 30 capsule 3  . nitroGLYCERIN (NITROSTAT) 0.4 MG SL tablet Place 1 tablet (0.4 mg total) under the tongue every 5 (five) minutes as needed for chest pain. 25 tablet 1   No current facility-administered medications on file prior to visit.    Allergies  Allergen Reactions  . Codeine Nausea And Vomiting  . Lisinopril Cough  . Sulfonamide Derivatives Nausea And Vomiting  . Tetracyclines & Related Rash    Family History  Problem Relation Age of Onset  . Dementia Mother   . Heart attack Father   . Coronary artery disease Father   . Cancer Brother        esophageal  . Esophageal cancer Brother   . Coronary artery disease Paternal Aunt   . Stomach cancer Paternal Aunt   . Coronary artery disease Paternal Grandmother   . Aneurysm Brother        aortic  . Rectal cancer Neg Hx   . Colon cancer Neg Hx   . Thyroid disease Neg Hx     BP (!) 142/78   Pulse 88   Ht 5\' 10"  (1.778 m)   Wt 155 lb (70.3 kg)   SpO2 95%   BMI 22.24 kg/m     Review of Systems Denies fever.      Objective:   Physical Exam VS: see vs page GEN: no distress HEAD: head: no deformity eyes: no periorbital swelling, no proptosis external nose and ears are normal NECK: a healed scar is present (old C-spine procedure).  I  do not appreciate a nodule in the thyroid or elsewhere in the neck CHEST WALL: no deformity LUNGS: clear to auscultation CV: tachycardic rate, but normal rhythm, no murmur.  MUSCULOSKELETAL: gait is normal and steady.   EXTEMITIES: no deformity.  no leg edema NEURO:  readily moves all 4's.  sensation is intact to touch on all 4's.  Moderate fine tremor of the hands.   SKIN:  Normal texture and temperature.  No rash or suspicious lesion is visible.  Not diaphoretic.   NODES:  None palpable at the neck PSYCH: alert, well-oriented.  Does not appear anxious nor depressed.     Lab Results  Component Value Date   TSH <0.01 (L) 12/27/2019      Assessment & Plan:  Hyperthyroidism, new to me.  uncertain etiology and prognosis.  Weight loss and CAD: in this setting, we'll start rx with tapazole, for prompt improvement.   HTN: she needs b-blocker for hyperthyroidism for now, so this is chosen as rx.   Patient Instructions  I have sent a prescription to your pharmacy, to slow the thyroid.  If ever you have fever while taking methimazole, stop it and call us, even if the reason is obvious, because of the risk of a rare side-effect.   I have also sent a prescription to your pharmacy, to help the blood pressure and slow the heart racing.   Please come back for a follow-up appointment in 2-3 weeks.

## 2020-02-19 DIAGNOSIS — E0590 Thyrotoxicosis, unspecified without thyrotoxic crisis or storm: Secondary | ICD-10-CM | POA: Insufficient documentation

## 2020-02-22 ENCOUNTER — Telehealth: Payer: Medicare Other

## 2020-02-22 NOTE — Chronic Care Management (AMB) (Deleted)
Chronic Care Management Pharmacy  Name: Laurie [name]  MRN: [id number] DOB: 1955/11/23   Chief Complaint/ HPI  Laurie [name],  65 y.o. , female presents for their Follow-Up CCM visit with the clinical pharmacist via telephone due to COVID-19 Pandemic.  PCP : [name], MD Patient Care Team: [name], MD as PCP - General (Family Medicine) Viona Gilmore, [facility] as Pharmacist (Pharmacist)  Their chronic conditions include: Hypertension, Hyperlipidemia, Coronary Artery Disease, Depression, Tobacco use and Chronic Pain   Office Visits: 01/05/20 [name], MD: Patient presented for video visit for sinusitis. Prescribed albuterol inhaler PRN.  11/29/19 [name], MD: Patient presented for video visit for sinus problem. Prescribed Augmentin x 10 days.  09/03/19: Patient presented to Dr. Elease Hashimoto for follow-up. LDL improved to 81, TSH low.   Consult Visit: 02/17/20 Renato Shin, MD (endo): Patient presented for hyperthyroidism evaluation. Prescribed methimazole 20 mg BID and metoprolol succinate 25 mg daily.  10/19/19: Patient presented to [place], PA-C for follow-up. Atorvastatin increased to 40 mg, patient started on NG. Patient with continued shortness of breath.  10/07/19: Patient hospitalized for cardiac cath and PCI 08/29/19: Patient presented to ED for radiculopathy. Discharged with ibuprofen, oxycodone, prednisone, and ondansetron.    Allergies  Allergen Reactions  . Codeine Nausea And Vomiting  . Lisinopril Cough  . Sulfonamide Derivatives Nausea And Vomiting  . Tetracyclines & Related Rash    Medications: Outpatient Encounter Medications as of 02/22/2020  Medication Sig  . albuterol (VENTOLIN HFA) 108 (90 Base) MCG/ACT inhaler Inhale 2 puffs into the lungs every 6 (six) hours as needed for wheezing or shortness of breath.  Marland Kitchen aspirin EC 81 MG tablet Take 1 tablet (81 mg total) by mouth daily. Swallow whole.  Marland Kitchen atorvastatin (LIPITOR)  40 MG tablet Take 1 tablet (40 mg total) by mouth daily.  . clopidogrel (PLAVIX) 75 MG tablet Take 4 tablets the first day then one tablet by mouth daily  . fluticasone (FLONASE) 50 MCG/ACT nasal spray Place 2 sprays into both nostrils daily.  Marland Kitchen guaiFENesin (MUCINEX) 600 MG 12 hr tablet Take 600 mg by mouth 2 (two) times daily as needed.  Marland Kitchen losartan (COZAAR) 50 MG tablet Take 1 tablet (50 mg total) by mouth daily.  . Menthol, Topical Analgesic, (BIOFREEZE EX) Apply 1 application topically daily as needed (back pain).  . methimazole (TAPAZOLE) 10 MG tablet Take 2 tablets (20 mg total) by mouth 2 (two) times daily.  . metoprolol succinate (TOPROL-XL) 25 MG 24 hr tablet Take 1 tablet (25 mg total) by mouth daily.  . nitroGLYCERIN (NITROSTAT) 0.4 MG SL tablet Place 1 tablet (0.4 mg total) under the tongue every 5 (five) minutes as needed for chest pain.  . pantoprazole (PROTONIX) 40 MG tablet TAKE 1 TABLET BY MOUTH DAILY  . venlafaxine XR (EFFEXOR-XR) 150 MG 24 hr capsule TAKE ONE CAPSULE BY MOUTH EVERY MORNING WITH BREAKFAST   No facility-administered encounter medications on file as of 02/22/2020.    Wt Readings from Last 3 Encounters:  02/17/20 155 lb (70.3 kg)  11/29/19 170 lb (77.1 kg)  10/19/19 169 lb (76.7 kg)    Current Diagnosis/Assessment:    Goals Addressed   None    Hypertension   BP goal is:  <130/80  Office blood pressures are  BP Readings from Last 3 Encounters:  02/17/20 (!) 142/78  10/19/19 140/70  10/07/19 (!) 139/50   Patient checks BP at home weekly (has a friend with a BP  cuff who will check)  Patient home BP readings are ranging: 180/90, typically 140s-150s when checking.  Patient has failed these meds in the past: Bisoprolol, HCTZ, Clonidine, Losartan, Nebivolol Patient is currently uncontrolled on the following medications:  . Losartan 50 mg 1 tablet daily  We discussed: Patient previously on blood pressure medications, unsure why they were stopped  during her last hospitalization.   Plan    Increase monitoring to 2-3 times weekly.  Hyperlipidemia   S/p PCI LCx and OM2 (10/07/19)  LDL goal < 70  Lipid Panel     Component Value Date/Time   CHOL 159 09/03/2019 1353   TRIG 227 (H) 09/03/2019 1353   HDL 46 (L) 09/03/2019 1353   LDLCALC 81 09/03/2019 1353   LDLDIRECT 123.0 03/14/2017 0948    Hepatic Function Latest Ref Rng & Units 09/03/2019 07/23/2019 09/28/2018  Total Protein 6.1 - 8.1 g/dL 6.9 7.0 7.0  Albumin 3.5 - 5.2 g/dL - 4.2 4.2  AST 10 - 35 U/L 9(L) 11 12  ALT 6 - 29 U/L _0 Alk Phosphatase 39 - 117 U/L - 76 81  Total Bilirubin 0.2 - 1.2 mg/dL 0.5 0.3 0.3  Bilirubin, Direct 0.0 - 0.2 mg/dL 0.1 - -     The 10-year ASCVD risk score Mikey Bussing DC Jr., et al., 2013) is: 15.6%   Values used to calculate the score:     Age: 58 years     Sex: Female     Is Non-Hispanic African American: No     Diabetic: No     Tobacco smoker: Yes     Systolic Blood Pressure: 004 mmHg     Is BP treated: Yes     HDL Cholesterol: 46 mg/dL     Total Cholesterol: 159 mg/dL   Patient has failed these meds in past: Pravastatin, Rosuvastatin Patient is currently uncontrolled on the following medications:  . Atorvastatin 40 mg daily  . Aspirin 81 mg daily  . Clopidogrel 75 mg daily  We discussed: Brillinta recently switched to clopidogrel due to shortness of breath and swelling, but patient had not yet picked up her clopidogrel. Instructed patient to continue Brillinta until she starts clopidogrel.   Patient stopped aspirin for past week, unsure if she should be taking with plavix or Brillinta. Counseled patient on the importance of both aspirin + clopidogrel at preventing clots and encouraged her to restart her aspirin.   Patient tolerating atorvastatin well.   Plan  Continue current medications  Depression / Anxiety   PHQ9 Score:  PHQ9 SCORE ONLY 11/24/2019  PHQ-9 Total Score 19   GAD7 Score: No flowsheet data  found.  Patient has failed these meds in past: Xanax, Elavil, Wellbutrin, Citalopram, Pristiq, Valium, Cymbalta  Patient is currently uncontrolled on the following medications:  . Venlafaxine XR 150 mg daily (started 2013)   We discussed:  Patient reports feeling more tearful and stressed since her heart surgery. She feels low energy and that her Venlafaxine is not working for her. Encouraged patient to schedule appointment with [facility].   Plan  Continue current medications until PCP visit on 11/29/19.   Tobacco Abuse   Tobacco Status:  Social History   Tobacco Use  Smoking Status Current Every Day Smoker  . Packs/day: 0.50  . Types: Cigarettes  Smokeless Tobacco Never Used  Tobacco Comment   trying to quit     Patient smokes After 30 minutes of waking Patient triggers include: stress On a scale of  1-10, reports MOTIVATION to quit is 8 On a scale of 1-10, reports CONFIDENCE in quitting is 5  Previous quit attempts included: Wellbutrin Patient is currently uncontrolled on the following medications:  . Nicotine Patch (Has not started).   We discussed:  Counseled on patch placement, side effects, and option to remove at night if they experience trouble sleeping or bad dreams.   Plan  Continue current medications  GERD   Patient with history of Esophageal Stricture Previously managed by Dr. Deatra Ina (Retired)   Patient reports dysphagia. Expresses understanding to avoid triggers such as citrus juices, fatty foods, large meals and tomato sauce.  Currently uncontrolled on: . Pantoprazole 40 mg daily (Ran Out)  Patient has not yet re-established with GI. Encouraged patient to do so.   Plan   Continue current medication.  Allergic Rhinitis   Patient has failed these meds in past: n/a Patient is currently controlled on the following medications:  Marland Kitchen Mucinex 600 mg twice daily PRN  We discussed:  Patient having significant congestion due to seasonal  allergies. Previously had taken Flonase to help her symptoms, wants to know if she can get a new Rx for it.   Plan  Recommend starting Flonase 50 mcg/act 2 spray daily PRN  Misc / OTC   . BioFreeze Ex daily PRN  . Nitroglycerin 0.4 mg PRN   Plan  Continue current medications  Medication Management   Pt uses [facility] for all medications Uses pill box? No - Hasn't purchased one yet. Pt endorses 70% compliance - Patient is not consistent about taking her medications at the same time every day and sometimes does forget to take her medications, although her Fiance will try to help remind her.  We discussed:    Plan  Utilize [facility] pharmacy for medication synchronization, packaging and delivery  Follow up: 3 month phone visit  [facility] [place]

## 2020-02-23 ENCOUNTER — Other Ambulatory Visit: Payer: Self-pay

## 2020-02-23 ENCOUNTER — Ambulatory Visit (INDEPENDENT_AMBULATORY_CARE_PROVIDER_SITE_OTHER): Payer: Medicare Other | Admitting: Family Medicine

## 2020-02-23 ENCOUNTER — Encounter: Payer: Self-pay | Admitting: Family Medicine

## 2020-02-23 VITALS — BP 132/70 | HR 92 | Temp 97.9°F | Wt 156.0 lb

## 2020-02-23 DIAGNOSIS — M546 Pain in thoracic spine: Secondary | ICD-10-CM | POA: Diagnosis not present

## 2020-02-23 DIAGNOSIS — R1084 Generalized abdominal pain: Secondary | ICD-10-CM

## 2020-02-23 DIAGNOSIS — F17210 Nicotine dependence, cigarettes, uncomplicated: Secondary | ICD-10-CM

## 2020-02-23 DIAGNOSIS — E0590 Thyrotoxicosis, unspecified without thyrotoxic crisis or storm: Secondary | ICD-10-CM | POA: Diagnosis not present

## 2020-02-23 DIAGNOSIS — R300 Dysuria: Secondary | ICD-10-CM

## 2020-02-23 LAB — POC URINALSYSI DIPSTICK (AUTOMATED)
Bilirubin, UA: NEGATIVE
Blood, UA: NEGATIVE
Glucose, UA: NEGATIVE
Ketones, UA: NEGATIVE
Leukocytes, UA: NEGATIVE
Nitrite, UA: NEGATIVE
Protein, UA: POSITIVE — AB
Spec Grav, UA: 1.025 (ref 1.010–1.025)
Urobilinogen, UA: 0.2 E.U./dL
pH, UA: 5 (ref 5.0–8.0)

## 2020-02-23 NOTE — Progress Notes (Signed)
Subjective:    Patient ID: Laurie [name], female    DOB: [birth year], 65 y.o.   MRN: [id number]  No chief complaint on file.   HPI Patient is a 65 yo female with pmh sig for HTN, CAD, COPD, IBS, GERD, hyperthyroidism, HLD, chronic back pain, interstitial cystitis, tobacco use who is followed by Dr. Elease Hashimoto and seen today for acute concerns.  Patient endorses flank pain, dysuria x 4 days.  Drinking a few bottles of water per day.  Note prior h/o renal calculi.  Has not had recent f/u with urology for h/o interstitial cystitis.  Pt also endorses abd pain/cramping, subjective fever, chills, dizziness, and dry heaving over the wknd.  Blood noted when pt induced emesis.   Pt smoking 1 ppd.  Started smoking at at 35. Past Medical History:  Diagnosis Date  . Allergy   . Anxiety   . Arthritis    RA  . Asthma   . Chronic back pain   . COPD (chronic obstructive pulmonary disease) ([place])   . Depression   . Esophageal stricture   . GERD (gastroesophageal reflux disease)   . Heart murmur   . Hematemesis 09/10/2018  . Hyperlipidemia   . Hypertension   . IBS (irritable bowel syndrome)   . Interstitial cystitis   . Neuromuscular disorder (HCC)    fibromyalgia    Allergies  Allergen Reactions  . Codeine Nausea And Vomiting  . Lisinopril Cough  . Sulfonamide Derivatives Nausea And Vomiting  . Tetracyclines & Related Rash    ROS General: Denies fever, chills, night sweats, changes in weight, changes in appetite  +dizziness, chills HEENT: Denies headaches, ear pain, changes in vision, rhinorrhea, sore throat  +subjective fever CV: Denies CP, palpitations, SOB, orthopnea Pulm: Denies SOB, cough, wheezing GI: Denies nausea, vomiting, diarrhea, constipation  +abdominal pain and cramping GU: Denies hematuria, frequency, vaginal discharge  +dysuria Msk: Denies muscle cramps, joint pains  +Mid R sided back pain Neuro: Denies weakness, numbness, tingling Skin: Denies rashes, bruising Psych:  Denies depression, anxiety, hallucinations      Objective:    Blood pressure 132/70, pulse 92, temperature 97.9 F (36.6 C), temperature source Oral, weight 156 lb (70.8 kg), SpO2 98 %.  Gen. Pleasant, well-nourished, in no distress, normal affect   HEENT: [place]/AT, face symmetric, conjunctiva clear, no scleral icterus, PERRLA, EOMI, nares patent without drainage Lungs: no accessory muscle use, CTAB, no wheezes or rales Cardiovascular: RRR, no m/r/g, no peripheral edema Abdomen: BS present, soft, diffuse TTP, mild gaurding, ND, no hepatosplenomegaly. Musculoskeletal: Kyphosis, CVA discomfort.  Midline cervical, thoracic, lumbar, and paraspinal TTP.  No deformities, no cyanosis or clubbing, normal tone Neuro:  A&Ox3, CN II-XII intact, normal gait Skin:  Warm, no lesions/ rash   Wt Readings from Last 3 Encounters:  02/17/20 155 lb (70.3 kg)  11/29/19 170 lb (77.1 kg)  10/19/19 169 lb (76.7 kg)    Lab Results  Component Value Date   WBC 10.1 09/27/2019   HGB 13.1 09/27/2019   HCT 39.7 09/27/2019   PLT 283 09/27/2019   GLUCOSE 109 (H) 09/27/2019   CHOL 159 09/03/2019   TRIG 227 (H) 09/03/2019   HDL 46 (L) 09/03/2019   LDLDIRECT 123.0 03/14/2017   LDLCALC 81 09/03/2019   ALT 14 09/03/2019   AST 9 (L) 09/03/2019   NA 142 09/27/2019   K 4.2 09/27/2019   CL 102 09/27/2019   CREATININE 0.92 09/27/2019   BUN 12 09/27/2019   CO2 26 09/27/2019  TSH <0.01 (L) 12/27/2019   INR 1.1 09/14/2018   HGBA1C 5.4 09/13/2018    Assessment/Plan:  Dysuria  -Discussed possible causes including UTI, pyelonephritis, interstitial cystitis flare -UA negative for acute infection with SG 1.025 and protein -Patient encouraged to increase p.o. intake of water and fluids - Plan: POCT Urinalysis Dipstick (Automated), Urine Culture, CMP  Acute right-sided thoracic back pain  -Discussed possible causes including renal calculi, UTI/pyelonephritis, muscle strain, chronic back problems (bulging  disc) -UA obtained -Discussed imaging - Plan: CMP, CT abdomen pelvis without contrast  Diffuse abdominal pain -Discussed possible causes including constipation, interstitial cystitis, diverticulitis also consider COVID-19 virus infection. -Patient with history of appendectomy. -Consider COVID testing -Patient given strict ED precautions for any increasing abdominal pain/worsening of symptoms. - Plan: CBC with Differential/Platelet, CMP  Hyperthyroidism -Recently diagnosed with Graves' disease -Continue methimazole 20 mg twice daily as started last week by endocrinology -Repeat thyroid labs scheduled in the next few weeks -Given strict precautions  Cigarette nicotine dependence without complication -Smoking cessation counseling greater than 3 minutes, less than 5 minutes -Patient encouraged to cut down on daily cigarette use -Continue to monitor  F/u as needed for continued or worsening symptoms  Grier Mitts, MD

## 2020-02-24 LAB — CBC WITH DIFFERENTIAL/PLATELET
Basophils Absolute: 0.1 10*3/uL (ref 0.0–0.1)
Basophils Relative: 1.3 % (ref 0.0–3.0)
Eosinophils Absolute: 0.3 10*3/uL (ref 0.0–0.7)
Eosinophils Relative: 5 % (ref 0.0–5.0)
HCT: 35.3 % — ABNORMAL LOW (ref 36.0–46.0)
Hemoglobin: 11.9 g/dL — ABNORMAL LOW (ref 12.0–15.0)
Lymphocytes Relative: 28.9 % (ref 12.0–46.0)
Lymphs Abs: 2 10*3/uL (ref 0.7–4.0)
MCHC: 33.6 g/dL (ref 30.0–36.0)
MCV: 81.2 fl (ref 78.0–100.0)
Monocytes Absolute: 0.6 10*3/uL (ref 0.1–1.0)
Monocytes Relative: 9.4 % (ref 3.0–12.0)
Neutro Abs: 3.8 10*3/uL (ref 1.4–7.7)
Neutrophils Relative %: 55.4 % (ref 43.0–77.0)
Platelets: 206 10*3/uL (ref 150.0–400.0)
RBC: 4.35 Mil/uL (ref 3.87–5.11)
RDW: 13.4 % (ref 11.5–15.5)
WBC: 6.9 10*3/uL (ref 4.0–10.5)

## 2020-02-24 LAB — URINE CULTURE
MICRO NUMBER:: 11409959
Result:: NO GROWTH
SPECIMEN QUALITY:: ADEQUATE

## 2020-02-24 LAB — COMPREHENSIVE METABOLIC PANEL
ALT: 14 U/L (ref 0–35)
AST: 12 U/L (ref 0–37)
Albumin: 3.7 g/dL (ref 3.5–5.2)
Alkaline Phosphatase: 77 U/L (ref 39–117)
BUN: 20 mg/dL (ref 6–23)
CO2: 27 mEq/L (ref 19–32)
Calcium: 9.3 mg/dL (ref 8.4–10.5)
Chloride: 103 mEq/L (ref 96–112)
Creatinine, Ser: 0.88 mg/dL (ref 0.40–1.20)
GFR: 69.04 mL/min (ref >60.00)
Glucose, Bld: 122 mg/dL — ABNORMAL HIGH (ref 70–99)
Potassium: 4.2 mEq/L (ref 3.5–5.1)
Sodium: 136 mEq/L (ref 135–145)
Total Bilirubin: 0.3 mg/dL (ref 0.2–1.2)
Total Protein: 6.7 g/dL (ref 6.0–8.3)

## 2020-02-27 NOTE — Progress Notes (Signed)
Virtual Visit via Video Note   This visit type was conducted due to national recommendations for restrictions regarding the COVID-19 Pandemic (e.g. social distancing) in an effort to limit this patient's exposure and mitigate transmission in our community.  Due to her co-morbid illnesses, this patient is at least at moderate risk for complications without adequate follow up.  This format is felt to be most appropriate for this patient at this time.  All issues noted in this document were discussed and addressed.  A limited physical exam was performed with this format.  Please refer to the patient's chart for her consent to telehealth for [facility].  Video Connection Lost Video connection was lost at > 50% of the duration of this visit, at which time the remainder of the visit was completed via audio only.     Date:  02/28/2020   ID:  Laurie [name], DOB 1955-11-02, MRN [id number]  Patient Location: Home Provider Location: Home Office  PCP:  [name], MD  Cardiologist:  No primary care provider on file.  Electrophysiologist:  None   Evaluation Performed:  Follow-Up Visit  Chief Complaint:  Follow up- CAD  History of Present Illness:    65 yo female with history of CAD, anxiety, rheumatoid arthritis, COPD/asthma, esophageal stricture, GERD, HTN, HLD, irritable bowel syndrome, tobacco abuse and fibromyalgia who is here today for cardiac follow up. I saw her as a new consult in August 2021 and she c/o chest pain and dyspnea. Echo in 2019 with LVEF 65%, mild MR. Cardiac cath in 2011 with mild CAD. Chronically abnormal EKG. Cardiac cath 10/07/19 with severe mid Circumflex/OM stenosis treated with two drug eluting stents. Mild disease in the proximal LAD and moderate disease in the mid RCA. LV systolic function was normal. She had dyspnea on Brilinta. She is now on ASA and Plavix.   The patient denies chest pain, dyspnea, palpitations, dizziness, near syncope or syncope. No  lower extremity edema. She has been diagnosed with Graves disease and is being treated with Tapazole. She is now on Toprol.   The patient does not have symptoms concerning for COVID-19 infection (fever, chills, cough, or new shortness of breath).    Past Medical History:  Diagnosis Date  . Allergy   . Anxiety   . Arthritis    RA  . Asthma   . Chronic back pain   . COPD (chronic obstructive pulmonary disease) ([place])   . Depression   . Esophageal stricture   . GERD (gastroesophageal reflux disease)   . Heart murmur   . Hematemesis 09/10/2018  . Hyperlipidemia   . Hypertension   . IBS (irritable bowel syndrome)   . Interstitial cystitis   . Neuromuscular disorder ([place])    fibromyalgia   Past Surgical History:  Procedure Laterality Date  . ABDOMINAL HYSTERECTOMY    . APPENDECTOMY    . BIOPSY  09/11/2018   Procedure: BIOPSY;  Surgeon: Rush Landmark Telford Nab., MD;  Location: [place];  Service: Gastroenterology;;  . CERVICAL FUSION    . CERVICAL LAMINECTOMY    . CORONARY STENT INTERVENTION N/A 10/07/2019   Procedure: CORONARY STENT INTERVENTION;  Surgeon: Burnell Blanks, MD;  Location: [place] [facility];  Service: Cardiovascular;  Laterality: N/A;  . ESOPHAGOGASTRODUODENOSCOPY    . ESOPHAGOGASTRODUODENOSCOPY (EGD) WITH PROPOFOL N/A 09/11/2018   Procedure: ESOPHAGOGASTRODUODENOSCOPY (EGD) WITH PROPOFOL;  Surgeon: Rush Landmark Telford Nab., MD;  Location: [place];  Service: Gastroenterology;  Laterality: N/A;  . FINGER  SURGERY    . LEFT HEART CATH AND CORONARY ANGIOGRAPHY N/A 10/07/2019   Procedure: LEFT HEART CATH AND CORONARY ANGIOGRAPHY;  Surgeon: Burnell Blanks, MD;  Location: [place] [facility];  Service: Cardiovascular;  Laterality: N/A;  . LUMBAR FUSION    . SAVORY DILATION N/A 09/11/2018   Procedure: SAVORY DILATION;  Surgeon: Rush Landmark Telford Nab., MD;  Location: [place];  Service: Gastroenterology;  Laterality: N/A;  . TONSILLECTOMY        Current Meds  Medication Sig  . albuterol (VENTOLIN HFA) 108 (90 Base) MCG/ACT inhaler Inhale 2 puffs into the lungs every 6 (six) hours as needed for wheezing or shortness of breath.  Marland Kitchen aspirin EC 81 MG tablet Take 1 tablet (81 mg total) by mouth daily. Swallow whole.  Marland Kitchen atorvastatin (LIPITOR) 40 MG tablet Take 1 tablet (40 mg total) by mouth daily.  . clopidogrel (PLAVIX) 75 MG tablet Take 4 tablets the first day then one tablet by mouth daily  . fluticasone (FLONASE) 50 MCG/ACT nasal spray Place 2 sprays into both nostrils daily.  Marland Kitchen guaiFENesin (MUCINEX) 600 MG 12 hr tablet Take 600 mg by mouth 2 (two) times daily as needed.  Marland Kitchen losartan (COZAAR) 50 MG tablet Take 1 tablet (50 mg total) by mouth daily.  . Menthol, Topical Analgesic, (BIOFREEZE EX) Apply 1 application topically daily as needed (back pain).  . methimazole (TAPAZOLE) 10 MG tablet Take 2 tablets (20 mg total) by mouth 2 (two) times daily.  . metoprolol succinate (TOPROL-XL) 25 MG 24 hr tablet Take 1 tablet (25 mg total) by mouth daily.  Marland Kitchen venlafaxine XR (EFFEXOR-XR) 150 MG 24 hr capsule TAKE ONE CAPSULE BY MOUTH EVERY MORNING WITH BREAKFAST     Allergies:   Codeine, Lisinopril, Sulfonamide derivatives, and Tetracyclines & related   Social History   Tobacco Use  . Smoking status: Current Every Day Smoker    Packs/day: 0.50    Types: Cigarettes  . Smokeless tobacco: Never Used  . Tobacco comment: trying to quit   Vaping Use  . Vaping Use: Never used  Substance Use Topics  . Alcohol use: No    Alcohol/week: 0.0 standard drinks  . Drug use: No     Family Hx: The patient's family history includes Aneurysm in her brother; Cancer in her brother; Coronary artery disease in her father, paternal aunt, and paternal grandmother; Dementia in her mother; Esophageal cancer in her brother; Heart attack in her father; Stomach cancer in her paternal aunt. There is no history of Rectal cancer, Colon cancer, or Thyroid  disease.  ROS:   Please see the history of present illness.    All other systems reviewed and are negative.   Prior CV studies:   The following studies were reviewed today:  Cardiac cath 10/07/19:  Mid RCA lesion is 50% stenosed.  2nd Mrg lesion is 80% stenosed.  Mid Cx lesion is 90% stenosed.  Prox LAD lesion is 40% stenosed.  A drug-eluting stent was successfully placed using a [place] [id number].  Post intervention, there is a 0% residual stenosis.  A drug-eluting stent was successfully placed using a [place] [id number].  Post intervention, there is a 0% residual stenosis.  The left ventricular systolic function is normal.  LV end diastolic pressure is normal.  There is no mitral valve regurgitation.  The left ventricular ejection fraction is 55-65% by visual estimate.   1. Mild non-obstructive disease in the proximal to mid LAD 2. Severe stenosis mid  Circumflex into OM1 3. Successful PTCA/DES x 2 Mid Circumflex into OM1 4. Moderate non-obstructive disease in the mid RCA 5. Normal LV systolic function.   Echo 11/06/17: Left ventricle: The cavity size was normal. There was mild  concentric hypertrophy. Systolic function was vigorous. The  estimated ejection fraction was in the range of 65% to 70%. Wall  motion was normal; there were no regional wall motion  abnormalities. Left ventricular diastolic function parameters  were normal.  - Aortic valve: Valve area (VTI): 2.38 cm^2. Valve area (Vmax):  2.41 cm^2. Valve area (Vmean): 2.45 cm^2.  - Aortic root: The aortic root was normal in size.  - Mitral valve: Structurally normal valve. There was mild  regurgitation.  - Left atrium: The atrium was at the upper limits of normal in  size.  - Right ventricle: The cavity size was normal. Wall thickness was  normal. Systolic function was normal.  - Right atrium: The atrium was normal in size.  - Tricuspid valve: There was mild  regurgitation.  - Pulmonary arteries: Systolic pressure was within the normal  range.  - Inferior vena cava: The vessel was normal in size.  - Pericardium, extracardiac: There was no pericardial effusion.    Labs/Other Tests and Data Reviewed:    EKG:  No ECG reviewed.  Recent Labs: 12/27/2019: TSH <0.01 02/23/2020: ALT 14; BUN 20; Creatinine, Ser 0.88; Hemoglobin 11.9; Platelets 206.0; Potassium 4.2; Sodium 136   Recent Lipid Panel Lab Results  Component Value Date/Time   CHOL 159 09/03/2019 01:53 PM   TRIG 227 (H) 09/03/2019 01:53 PM   HDL 46 (L) 09/03/2019 01:53 PM   CHOLHDL 3.5 09/03/2019 01:53 PM   LDLCALC 81 09/03/2019 01:53 PM   LDLDIRECT 123.0 03/14/2017 09:48 AM    Wt Readings from Last 3 Encounters:  02/28/20 155 lb (70.3 kg)  02/23/20 156 lb (70.8 kg)  02/17/20 155 lb (70.3 kg)     Objective:    Vital Signs:  Ht 5\' 10"  (1.778 m)   Wt 155 lb (70.3 kg)   BMI 22.24 kg/m    VITAL SIGNS:  Reviewed. Weight only. Pt unable to obtain BP  ASSESSMENT & PLAN:    1. CAD without angina: PCI/stenting of the Circumflex/OM in August 2021. No chest pain. Will continue ASA, Plavix, statin and beta blocker  2. HTN: She cannot check her BP at home. I have asked her to buy a BP cuff.  No changes today  3. Hyperlipidemia: LDL near goal. Continue statin.   COVID-19 Education: The signs and symptoms of COVID-19 were discussed with the patient and how to seek care for testing (follow up with PCP or arrange E-visit).  The importance of social distancing was discussed today.  Time:   Today, I have spent 20 minutes with the patient with telehealth technology discussing the above problems.     Medication Adjustments/Labs and Tests Ordered: Current medicines are reviewed at length with the patient today.  Concerns regarding medicines are outlined above.   Tests Ordered: No orders of the defined types were placed in this encounter.   Medication Changes: No orders of the  defined types were placed in this encounter.   Disposition:  Follow up with me in 6 months.   Signed, Lauree Chandler, MD  02/28/2020 9:49 AM    [place]

## 2020-02-28 ENCOUNTER — Telehealth (INDEPENDENT_AMBULATORY_CARE_PROVIDER_SITE_OTHER): Payer: Medicare Other | Admitting: Cardiovascular Disease

## 2020-02-28 ENCOUNTER — Other Ambulatory Visit: Payer: Self-pay

## 2020-02-28 ENCOUNTER — Encounter: Payer: Self-pay | Admitting: Cardiovascular Disease

## 2020-02-28 VITALS — Ht 70.0 in | Wt 155.0 lb

## 2020-02-28 DIAGNOSIS — I10 Essential (primary) hypertension: Secondary | ICD-10-CM | POA: Diagnosis not present

## 2020-02-28 DIAGNOSIS — E782 Mixed hyperlipidemia: Secondary | ICD-10-CM | POA: Diagnosis not present

## 2020-02-28 DIAGNOSIS — I2510 Atherosclerotic heart disease of native coronary artery without angina pectoris: Secondary | ICD-10-CM | POA: Diagnosis not present

## 2020-02-28 NOTE — Patient Instructions (Signed)
Medication Instructions:  °No changes °*If you need a refill on your cardiac medications before your next appointment, please call your pharmacy* ° ° °Lab Work: °none °If you have labs (blood work) drawn today and your tests are completely normal, you will receive your results only by: °• MyChart Message (if you have MyChart) OR °• A paper copy in the mail °If you have any lab test that is abnormal or we need to change your treatment, we will call you to review the results. ° ° °Testing/Procedures: °None  ° ° °Follow-Up: °At [facility], you and your health needs are our priority.  As part of our continuing mission to provide you with exceptional heart care, we have created designated Provider Care Teams.  These Care Teams include your primary Cardiologist (physician) and Advanced Practice Providers (APPs -  Physician Assistants and Nurse Practitioners) who all work together to provide you with the care you need, when you need it. ° °We recommend signing up for the patient portal called "MyChart".  Sign up information is provided on this After Visit Summary.  MyChart is used to connect with patients for Virtual Visits (Telemedicine).  Patients are able to view lab/test results, encounter notes, upcoming appointments, etc.  Non-urgent messages can be sent to your provider as well.   °To learn more about what you can do with MyChart, go to [email or web address].   ° °Your next appointment:   °6 month(s) ° °The format for your next appointment:   °In Person ° °Provider:   °You may see Christopher McAlhany, MD or one of the following Advanced Practice Providers on your designated Care Team:   °· Dayna Dunn, PA-C °· Michele Lenze, PA-C ° ° ° °Other Instructions ° ° °

## 2020-03-09 ENCOUNTER — Telehealth: Payer: Self-pay | Admitting: Pharmacist

## 2020-03-09 ENCOUNTER — Other Ambulatory Visit: Payer: Self-pay

## 2020-03-09 NOTE — Chronic Care Management (AMB) (Signed)
Chronic Care Management Pharmacy Assistant   Name: Laurie [name]  MRN: [id number] DOB: 26-Apr-1955  Reason for Encounter: Medication Review  PCP : [name], MD  Allergies:   Allergies  Allergen Reactions   Codeine Nausea And Vomiting   Lisinopril Cough   Sulfonamide Derivatives Nausea And Vomiting   Tetracyclines & Related Rash    Medications: Outpatient Encounter Medications as of 03/09/2020  Medication Sig   albuterol (VENTOLIN HFA) 108 (90 Base) MCG/ACT inhaler Inhale 2 puffs into the lungs every 6 (six) hours as needed for wheezing or shortness of breath.   aspirin EC 81 MG tablet Take 1 tablet (81 mg total) by mouth daily. Swallow whole.   atorvastatin (LIPITOR) 40 MG tablet Take 1 tablet (40 mg total) by mouth daily.   clopidogrel (PLAVIX) 75 MG tablet Take 4 tablets the first day then one tablet by mouth daily   fluticasone (FLONASE) 50 MCG/ACT nasal spray Place 2 sprays into both nostrils daily.   guaiFENesin (MUCINEX) 600 MG 12 hr tablet Take 600 mg by mouth 2 (two) times daily as needed.   losartan (COZAAR) 50 MG tablet Take 1 tablet (50 mg total) by mouth daily.   Menthol, Topical Analgesic, (BIOFREEZE EX) Apply 1 application topically daily as needed (back pain).   methimazole (TAPAZOLE) 10 MG tablet Take 2 tablets (20 mg total) by mouth 2 (two) times daily.   metoprolol succinate (TOPROL-XL) 25 MG 24 hr tablet Take 1 tablet (25 mg total) by mouth daily.   nitroGLYCERIN (NITROSTAT) 0.4 MG SL tablet Place 1 tablet (0.4 mg total) under the tongue every 5 (five) minutes as needed for chest pain.   pantoprazole (PROTONIX) 40 MG tablet TAKE 1 TABLET BY MOUTH DAILY (Patient not taking: Reported on 02/28/2020)   venlafaxine XR (EFFEXOR-XR) 150 MG 24 hr capsule TAKE ONE CAPSULE BY MOUTH EVERY MORNING WITH BREAKFAST   No facility-administered encounter medications on file as of 03/09/2020.    Current Diagnosis: Patient Active Problem List    Diagnosis Date Noted   Hyperthyroidism 02/19/2020   Coronary artery disease involving native coronary artery of native heart with unstable angina pectoris ([place])    Substance abuse ([place]) 07/23/2019   Renal insufficiency 09/10/2018   Elevated troponin 09/10/2018   Leukocytosis 09/10/2018   Hematemesis 09/09/2018   Opiate withdrawal ([place]) 09/09/2018   Vitamin D deficiency 06/10/2018   Shortness of breath 11/05/2017   Elevated brain natriuretic peptide (BNP) level 11/05/2017   Polysubstance (excluding opioids) dependence, daily use ([place]) 11/05/2017   AKI (acute kidney injury) ([place]) 11/05/2017   Low serum vitamin D 08/23/2015   Hyperlipidemia 11/08/2014   Reflux esophagitis 03/23/2014   Dysphagia, pharyngoesophageal phase 03/23/2014   Pain in the chest 03/23/2014   Special screening for malignant neoplasms, colon 07/09/2013   Chest pain 04/23/2012   Dyspnea 04/23/2012   Murmur 04/23/2012   INTERSTITIAL CYSTITIS 01/01/2010   SWELLING, NECK 01/01/2010   CAD, NATIVE VESSEL 12/19/2009   Palpitations 11/21/2009   ABNORMAL CV (STRESS) TEST 11/21/2009   Essential hypertension 10/31/2009   ELECTROCARDIOGRAM, ABNORMAL 10/25/2009   PLANTAR FASCIITIS, BILATERAL 10/12/2009   CERVICAL RADICULOPATHY 07/04/2008   Pain in soft tissues of limb 03/25/2008   DEGENERATIVE DISC DISEASE, CERVICAL SPINE, W/RADICULOPATHY 09/08/2007   LOW BACK PAIN 09/08/2007   TOBACCO USE 03/23/2007   OSTEOARTHROSIS, LOCAL NOS, LOWER LEG 11/07/2006   STATE, SYMPTOMATIC MENOPAUSE/FEM CLIMACTERIC 10/27/2006   Depression, recurrent ([place]) 09/15/2006   Thoracic or lumbosacral neuritis or radiculitis, unspecified  09/15/2006   IBS 09/09/2006   STRICTURE, ESOPHAGEAL 03/16/2001    Goals Addressed   None    Reviewed chart for medication changes ahead of medication coordination call. Reviewed  OVs, Consults, or hospital visits since last care coordination call/Pharmacist visit.    01-17-20212 Burnell Blanks, MD Cardiology  02-23-2020 Billie Ruddy, MD Family Medicine  02-17-2020 Renato Shin, MD Endocrinology  Reviewed  medication changes indicated  Methimazole (TAPAZOLE) 10 mg: Two (2) tablets twice a day  Metoprolol succinate (TOPROL-XL) 25 MG 24 hr: Daily  BP Readings from Last 3 Encounters:  02/23/20 132/70  02/17/20 (!) 142/78  10/19/19 140/70    Lab Results  Component Value Date   HGBA1C 5.4 09/13/2018     Patient obtains medications through Adherence Packaging  30 Days  Last adherence delivery included:   Losartan (COZAAR) 50 mg: one tablet with breakfast  Venlafaxine XR (EFFEXOR-XR) 150 mg: one capsule by mouth with breakfast  Fluticasone (FLONASE) 50 MCG/ACT nasal spray: Place 2 sprays into both nostrils daily  Patient declined the following medications  last month due to PRN use/additional supply on hand.  Losartan (COZAAR) 50 mg: one tablet with breakfast  Fluticasone (FLONASE) 50 MCG/ACT nasal spray: Place 2 sprays into both nostrils daily  Venlafaxine XR (EFFEXOR-XR) 150 mg: one capsule by mouth with breakfast  Clopidogrel (PLAVIX) 75 mg: four (4) tablets the first day then one tablet by mouth daily  Atorvastatin (LIPITOR) 40 mg: one tablet at bedtime  Pantoprazole (PROTONIX) 40 mg: one tablet at breakfast  Aspirin EC 81 mg: one tablet at bedtime  Nitroglycerin (NITROSTAT) 0.4 MG SL: Place 1 tablet (0.4 mg total) under the tongue every 5 (five) minutes as needed for chest pain  Patient is due for next adherence delivery on: 03-15-2020. Called patient and reviewed medications and coordinated delivery. This delivery to include:  Losartan (COZAAR) 50 mg: one tablet with breakfast  Venlafaxine XR (EFFEXOR-XR) 150 mg: one capsule by mouth with breakfast  Pantoprazole (PROTONIX) 40 mg: one tablet at breakfast  Atorvastatin (LIPITOR) 40 mg: one tablet at bedtime  Methimazole (TAPAZOLE) 10 mg: Two (2) tablets  at breakfast and two (2) at bedtime  Metoprolol succinate (TOPROL-XL) 25 MG 24 hr: one tablet at breakfast  Albuterol (VENTOLIN HFA) 108 (90 Base) MCG/ACT: Inhale 2 puffs every 6 hours as needed  Coordinated acute fill for Clopidogrel 75 mg  to be delivered 03/09/2020.  Patient declined the following medications  Fluticasone (FLONASE) 50 MCG/ACT nasal spray: Place 2 sprays into both nostrils daily Patient needs refills for:  Pantoprazole (PROTONIX) 40 mg: one tablet at breakfast  Confirmed delivery date of 03/15/2020, advised patient that pharmacy will contact them the morning of delivery. Follow-Up:  Care Coordination with Outside Provider and Pharmacist Review  Maia Breslow, [place] Assistant [phone]

## 2020-03-10 ENCOUNTER — Other Ambulatory Visit: Payer: Medicare Other

## 2020-03-10 ENCOUNTER — Ambulatory Visit
Admission: RE | Admit: 2020-03-10 | Discharge: 2020-03-10 | Disposition: A | Discharge status: Home / Self Care | Payer: Medicare Other | Source: Ambulatory Visit | Attending: Family Medicine | Admitting: Family Medicine

## 2020-03-10 DIAGNOSIS — R109 Unspecified abdominal pain: Secondary | ICD-10-CM | POA: Diagnosis not present

## 2020-03-10 DIAGNOSIS — N200 Calculus of kidney: Secondary | ICD-10-CM | POA: Diagnosis not present

## 2020-03-10 DIAGNOSIS — R1084 Generalized abdominal pain: Secondary | ICD-10-CM

## 2020-03-13 ENCOUNTER — Other Ambulatory Visit: Payer: Self-pay

## 2020-03-13 ENCOUNTER — Ambulatory Visit (INDEPENDENT_AMBULATORY_CARE_PROVIDER_SITE_OTHER): Payer: Medicare Other | Admitting: Endocrinology

## 2020-03-13 ENCOUNTER — Ambulatory Visit: Payer: Medicare Other | Admitting: Endocrinology

## 2020-03-13 VITALS — BP 176/60 | HR 91 | Ht 70.0 in | Wt 156.2 lb

## 2020-03-13 DIAGNOSIS — E0590 Thyrotoxicosis, unspecified without thyrotoxic crisis or storm: Secondary | ICD-10-CM

## 2020-03-13 LAB — T4, FREE: Free T4: 1.56 ng/dL (ref 0.60–1.60)

## 2020-03-13 MED ORDER — METOPROLOL SUCCINATE ER 50 MG PO TB24: 50.0000 mg | ORAL_TABLET | Freq: Every day | ORAL | 3 refills | Status: DC

## 2020-03-13 NOTE — Progress Notes (Signed)
Subjective:    Patient ID: Laurie [name], female    DOB: [birth year], 65 y.o.   MRN: [id number]  HPI Pt returns for f/u of hyperthyroidism Dx'ed:2021 Imaging: never Current rx: tapazole Other: tapazole is chosen as initial rx, due to severity Interval hx: Since on tapazole, pt states she feels only slightly better in general.  Tremor and palpitations persist.   Past Medical History:  Diagnosis Date  . Allergy   . Anxiety   . Arthritis    RA  . Asthma   . Chronic back pain   . COPD (chronic obstructive pulmonary disease) ([place])   . Depression   . Esophageal stricture   . GERD (gastroesophageal reflux disease)   . Heart murmur   . Hematemesis 09/10/2018  . Hyperlipidemia   . Hypertension   . IBS (irritable bowel syndrome)   . Interstitial cystitis   . Neuromuscular disorder ([place])    fibromyalgia    Past Surgical History:  Procedure Laterality Date  . ABDOMINAL HYSTERECTOMY    . APPENDECTOMY    . BIOPSY  09/11/2018   Procedure: BIOPSY;  Surgeon: Rush Landmark Telford Nab., MD;  Location: [facility];  Service: Gastroenterology;;  . CERVICAL FUSION    . CERVICAL LAMINECTOMY    . CORONARY STENT INTERVENTION N/A 10/07/2019   Procedure: CORONARY STENT INTERVENTION;  Surgeon: Burnell Blanks, MD;  Location: [facility];  Service: Cardiovascular;  Laterality: N/A;  . ESOPHAGOGASTRODUODENOSCOPY    . ESOPHAGOGASTRODUODENOSCOPY (EGD) WITH PROPOFOL N/A 09/11/2018   Procedure: ESOPHAGOGASTRODUODENOSCOPY (EGD) WITH PROPOFOL;  Surgeon: Rush Landmark Telford Nab., MD;  Location: [facility];  Service: Gastroenterology;  Laterality: N/A;  . FINGER SURGERY    . LEFT HEART CATH AND CORONARY ANGIOGRAPHY N/A 10/07/2019   Procedure: LEFT HEART CATH AND CORONARY ANGIOGRAPHY;  Surgeon: Burnell Blanks, MD;  Location: [facility];  Service: Cardiovascular;  Laterality: N/A;  . LUMBAR FUSION    . SAVORY DILATION N/A 09/11/2018   Procedure: SAVORY DILATION;  Surgeon:  Rush Landmark Telford Nab., MD;  Location: [facility];  Service: Gastroenterology;  Laterality: N/A;  . TONSILLECTOMY      Social History   Socioeconomic History  . Marital status: Single    Spouse name: Not on file  . Number of children: Not on file  . Years of education: Not on file  . Highest education level: Not on file  Occupational History  . Occupation: disability  Tobacco Use  . Smoking status: Current Every Day Smoker    Packs/day: 0.50    Types: Cigarettes  . Smokeless tobacco: Never Used  . Tobacco comment: trying to quit   Vaping Use  . Vaping Use: Never used  Substance and Sexual Activity  . Alcohol use: No    Alcohol/week: 0.0 standard drinks  . Drug use: No  . Sexual activity: Not on file  Other Topics Concern  . Not on file  Social History Narrative  . Not on file   Social Determinants of Health   Financial Resource Strain: Low Risk   . Difficulty of Paying Living Expenses: Not very hard  Food Insecurity: Not on file  Transportation Needs: No Transportation Needs  . Lack of Transportation (Medical): No  . Lack of Transportation (Non-Medical): No  Physical Activity: Not on file  Stress: Not on file  Social Connections: Not on file  Intimate Partner Violence: Not on file    Current Outpatient Medications on File Prior to Visit  Medication Sig Dispense Refill  .  albuterol (VENTOLIN HFA) 108 (90 Base) MCG/ACT inhaler Inhale 2 puffs into the lungs every 6 (six) hours as needed for wheezing or shortness of breath. 18 g 0  . aspirin EC 81 MG tablet Take 1 tablet (81 mg total) by mouth daily. Swallow whole. 90 tablet 3  . atorvastatin (LIPITOR) 40 MG tablet Take 1 tablet (40 mg total) by mouth daily. 90 tablet 3  . clopidogrel (PLAVIX) 75 MG tablet Take 4 tablets the first day then one tablet by mouth daily 90 tablet 3  . fluticasone (FLONASE) 50 MCG/ACT nasal spray Place 2 sprays into both nostrils daily. 16 g 6  . guaiFENesin (MUCINEX) 600 MG 12 hr tablet  Take 600 mg by mouth 2 (two) times daily as needed.    Marland Kitchen losartan (COZAAR) 50 MG tablet Take 1 tablet (50 mg total) by mouth daily. 90 tablet 3  . Menthol, Topical Analgesic, (BIOFREEZE EX) Apply 1 application topically daily as needed (back pain).    . methimazole (TAPAZOLE) 10 MG tablet Take 2 tablets (20 mg total) by mouth 2 (two) times daily. 120 tablet 5  . venlafaxine XR (EFFEXOR-XR) 150 MG 24 hr capsule TAKE ONE CAPSULE BY MOUTH EVERY MORNING WITH BREAKFAST 30 capsule 3  . nitroGLYCERIN (NITROSTAT) 0.4 MG SL tablet Place 1 tablet (0.4 mg total) under the tongue every 5 (five) minutes as needed for chest pain. 25 tablet 1   No current facility-administered medications on file prior to visit.    Allergies  Allergen Reactions  . Codeine Nausea And Vomiting  . Lisinopril Cough  . Sulfonamide Derivatives Nausea And Vomiting  . Tetracyclines & Related Rash    Family History  Problem Relation Age of Onset  . Dementia Mother   . Heart attack Father   . Coronary artery disease Father   . Cancer Brother        esophageal  . Esophageal cancer Brother   . Coronary artery disease Paternal Aunt   . Stomach cancer Paternal Aunt   . Coronary artery disease Paternal Grandmother   . Aneurysm Brother        aortic  . Rectal cancer Neg Hx   . Colon cancer Neg Hx   . Thyroid disease Neg Hx     BP (!) 176/60 (BP Location: Right Arm, Patient Position: Sitting, Cuff Size: Normal)   Pulse 91   Ht 5\' 10"  (1.778 m)   Wt 156 lb 3.2 oz (70.9 kg)   SpO2 96%   BMI 22.41 kg/m   Review of Systems Denies fever    Objective:   Physical Exam VITAL SIGNS:  See vs page GENERAL: no distress NECK: thyroid is 5x normal size--diffuse.    Lab Results  Component Value Date   TSH <0.01 Repeated and verified X2. (L) 03/13/2020      Assessment & Plan:  Hyperthyroidism, uncontrolled.  Please continue the same methimazole, as it will work more, with time.

## 2020-03-13 NOTE — Patient Instructions (Addendum)
I have sent a prescription to your pharmacy, to double the metoprolol.  Blood tests are requested for you today.  We'll let you know about the results.  If ever you have fever while taking methimazole, stop it and call us, even if the reason is obvious, because of the risk of a rare side-effect.   I have also sent a prescription to your pharmacy, to help the blood pressure and slow the heart racing.   Please come back for a follow-up appointment in 1 month.

## 2020-03-14 LAB — TSH: TSH: <0.01 u[IU]/mL — ABNORMAL LOW (ref 0.35–4.50)

## 2020-03-15 ENCOUNTER — Other Ambulatory Visit: Payer: Self-pay

## 2020-03-15 MED ORDER — PANTOPRAZOLE SODIUM 40 MG PO TBEC: 40.0000 mg | DELAYED_RELEASE_TABLET | Freq: Every day | ORAL | 0 refills | Status: DC

## 2020-04-04 ENCOUNTER — Other Ambulatory Visit: Payer: Self-pay | Admitting: *Deleted

## 2020-04-04 ENCOUNTER — Telehealth: Payer: Medicare Other

## 2020-04-04 MED ORDER — VENLAFAXINE HCL ER 150 MG PO CP24: ORAL_CAPSULE | ORAL | 1 refills | Status: DC

## 2020-04-04 NOTE — Progress Notes (Incomplete)
Chronic Care Management Pharmacy Note  04/04/2020 Name:  Laurie [name] MRN:  [id number] DOB:  May 12, 1955  Subjective: Laurie [name] is an 65 y.o. year old female who is a primary patient of Burchette, Alinda Sierras, MD.  The CCM team was consulted for assistance with disease management and care coordination needs.    Engaged with patient by telephone for follow up visit in response to provider referral for pharmacy case management and/or care coordination services.   Consent to Services:  The patient was given information about Chronic Care Management services, agreed to services, and gave verbal consent prior to initiation of services.  Please see initial visit note for detailed documentation.   Patient Care Team: [name], MD as PCP - General (Family Medicine) Burnell Blanks, MD as PCP - Cardiology (Cardiology) Viona Gilmore, [facility] as Pharmacist (Pharmacist)  Recent office visits: 02/23/20 Grier Mitts, MD: Patient presented for office visit for dysuria. Urine culture was negative for growth.  01/05/20 [name], MD: Patient presented for video visit for sinusitis. Prescribed albuterol inhaler PRN.  Recent consult visits: 03/13/20 Renato Shin, MD (endo): Patient presented for hyperthyroidism. TSH still low but T4 is normal. Increased metoprolol to 50 mg daily.   02/28/20 Lauree Chandler, MD (cardiology): Patient presented for CAD follow up.   02/17/20 Renato Shin, MD (endo): Patient presented for hyperthyroidism initial visit. Prescribed methimazole 20 mg BID and metoprolol succinate 25 mg daily.   Hospital visits: {Hospital DC Yes/No:25215}  Objective:  Lab Results  Component Value Date   CREATININE 0.88 02/23/2020   BUN 20 02/23/2020   GFR 69.04 02/23/2020   GFRNONAA 66 09/27/2019   GFRAA 76 09/27/2019   NA 136 02/23/2020   K 4.2 02/23/2020   CALCIUM 9.3 02/23/2020   CO2 27 02/23/2020    Lab Results  Component Value Date/Time    HGBA1C 5.4 09/13/2018 03:11 AM   GFR 69.04 02/23/2020 05:01 PM   GFR 57.02 (L) 07/23/2019 02:41 PM    Last diabetic Eye exam: No results found for: HMDIABEYEEXA  Last diabetic Foot exam: No results found for: HMDIABFOOTEX   Lab Results  Component Value Date   CHOL 159 09/03/2019   HDL 46 (L) 09/03/2019   LDLCALC 81 09/03/2019   LDLDIRECT 123.0 03/14/2017   TRIG 227 (H) 09/03/2019   CHOLHDL 3.5 09/03/2019    Hepatic Function Latest Ref Rng & Units 02/23/2020 09/03/2019 07/23/2019  Total Protein 6.0 - 8.3 g/dL 6.7 6.9 7.0  Albumin 3.5 - 5.2 g/dL 3.7 - 4.2  AST 0 - 37 U/L 12 9(L) 11  ALT 0 - 35 U/L 14 14 13   Alk Phosphatase 39 - 117 U/L 77 - 76  Total Bilirubin 0.2 - 1.2 mg/dL 0.3 0.5 0.3  Bilirubin, Direct 0.0 - 0.2 mg/dL - 0.1 -    Lab Results  Component Value Date/Time   TSH <0.01 Repeated and verified X2. (L) 03/13/2020 01:30 PM   TSH <0.01 (L) 12/27/2019 09:11 AM   FREET4 1.56 03/13/2020 01:30 PM   FREET4 2.0 (H) 12/27/2019 09:11 AM    CBC Latest Ref Rng & Units 02/23/2020 09/27/2019 08/30/2019  WBC 4.0 - 10.5 K/uL 6.9 10.1 10.4  Hemoglobin 12.0 - 15.0 g/dL 11.9(L) 13.1 11.9(L)  Hematocrit 36.0 - 46.0 % 35.3(L) 39.7 37.5  Platelets 150.0 - 400.0 K/uL 206.0 283 176    Lab Results  Component Value Date/Time   VD25OH 38.94 03/14/2017 09:48 AM   VD25OH 16.55 (L) 08/18/2015 08:36 AM  Clinical ASCVD: {YES/NO:21197} The 10-year ASCVD risk score Mikey Bussing DC Jr., et al., 2013) is: 23.1%   Values used to calculate the score:     Age: 33 years     Sex: Female     Is Non-Hispanic African American: No     Diabetic: No     Tobacco smoker: Yes     Systolic Blood Pressure: 878 mmHg     Is BP treated: Yes     HDL Cholesterol: 46 mg/dL     Total Cholesterol: 159 mg/dL    Depression screen PHQ 2/9 11/24/2019  Decreased Interest 3  Down, Depressed, Hopeless 2  PHQ - 2 Score 5  Altered sleeping 3  Tired, decreased energy 3  Change in appetite 1  Feeling bad or failure about  yourself  2  Trouble concentrating 2  Moving slowly or fidgety/restless 2  Suicidal thoughts 1  PHQ-9 Score 19  Some recent data might be hidden     ***Other: (CHADS2VASc if Afib, MMRC or CAT for COPD, ACT, DEXA)  Social History   Tobacco Use  Smoking Status Current Every Day Smoker  . Packs/day: 0.50  . Types: Cigarettes  Smokeless Tobacco Never Used  Tobacco Comment   trying to quit    BP Readings from Last 3 Encounters:  03/13/20 (!) 176/60  02/23/20 132/70  02/17/20 (!) 142/78   Pulse Readings from Last 3 Encounters:  03/13/20 91  02/23/20 92  02/17/20 88   Wt Readings from Last 3 Encounters:  03/13/20 156 lb 3.2 oz (70.9 kg)  02/28/20 155 lb (70.3 kg)  02/23/20 156 lb (70.8 kg)    Assessment/Interventions: Review of patient past medical history, allergies, medications, health status, including review of consultants reports, laboratory and other test data, was performed as part of comprehensive evaluation and provision of chronic care management services.   SDOH:  (Social Determinants of Health) assessments and interventions performed: {yes/no:20286}   CCM Care Plan  Allergies  Allergen Reactions  . Codeine Nausea And Vomiting  . Lisinopril Cough  . Sulfonamide Derivatives Nausea And Vomiting  . Tetracyclines & Related Rash    Medications Reviewed Today    Reviewed by Casandra Doffing, CMA (Certified Medical Assistant) on 03/13/20 at 1315  Med List Status: <None>  Medication Order Taking? Sig Documenting Provider Last Dose Status Informant  albuterol (VENTOLIN HFA) 108 (90 Base) MCG/ACT inhaler [id number] Yes Inhale 2 puffs into the lungs every 6 (six) hours as needed for wheezing or shortness of breath. [name], MD Taking Active   aspirin EC 81 MG tablet [id number] Yes Take 1 tablet (81 mg total) by mouth daily. Swallow whole. Burnell Blanks, MD Taking Active Self  atorvastatin (LIPITOR) 40 MG tablet [id number] Yes Take 1 tablet (40 mg  total) by mouth daily. Burnell Blanks, MD Taking Active   clopidogrel (PLAVIX) 75 MG tablet [id number] Yes Take 4 tablets the first day then one tablet by mouth daily Burnell Blanks, MD Taking Active   fluticasone [facility]) 50 MCG/ACT nasal spray [id number] Yes Place 2 sprays into both nostrils daily. [name], MD Taking Active   guaiFENesin (MUCINEX) 600 MG 12 hr tablet [id number] Yes Take 600 mg by mouth 2 (two) times daily as needed. [provider] Taking Active   losartan (COZAAR) 50 MG tablet [id number] Yes Take 1 tablet (50 mg total) by mouth daily. [name], MD Taking Active   Menthol, Topical Analgesic, [facility] EX) [id number] Yes Apply 1 application  topically daily as needed (back pain). [provider] Taking Active Self  methimazole (TAPAZOLE) 10 MG tablet [id number] Yes Take 2 tablets (20 mg total) by mouth 2 (two) times daily. Renato Shin, MD Taking Active   metoprolol succinate (TOPROL-XL) 25 MG 24 hr tablet [id number] Yes Take 1 tablet (25 mg total) by mouth daily. Renato Shin, MD Taking Active   nitroGLYCERIN (NITROSTAT) 0.4 MG SL tablet [id number]  Place 1 tablet (0.4 mg total) under the tongue every 5 (five) minutes as needed for chest pain. Furth, Cadence H, PA-C  Expired 01/17/20 2359   pantoprazole (PROTONIX) 40 MG tablet [id number] Yes TAKE 1 TABLET BY MOUTH DAILY Isaac Bliss, Rayford Halsted, MD Taking Active   venlafaxine XR (EFFEXOR-XR) 150 MG 24 hr capsule [id number] Yes TAKE ONE CAPSULE BY MOUTH EVERY MORNING WITH BREAKFAST Burchette, Alinda Sierras, MD Taking Active           Patient Active Problem List   Diagnosis Date Noted  . Hyperthyroidism 02/19/2020  . Coronary artery disease involving native coronary artery of native heart with unstable angina pectoris ([place])   . Substance abuse ([place]) 07/23/2019  . Renal insufficiency 09/10/2018  . Elevated troponin 09/10/2018  . Leukocytosis 09/10/2018  . Hematemesis 09/09/2018  .  Opiate withdrawal ([place]) 09/09/2018  . Vitamin D deficiency 06/10/2018  . Shortness of breath 11/05/2017  . Elevated brain natriuretic peptide (BNP) level 11/05/2017  . Polysubstance (excluding opioids) dependence, daily use ([place]) 11/05/2017  . AKI (acute kidney injury) ([place]) 11/05/2017  . Low serum vitamin D 08/23/2015  . Hyperlipidemia 11/08/2014  . Reflux esophagitis 03/23/2014  . Dysphagia, pharyngoesophageal phase 03/23/2014  . Pain in the chest 03/23/2014  . Special screening for malignant neoplasms, colon 07/09/2013  . Chest pain 04/23/2012  . Dyspnea 04/23/2012  . Murmur 04/23/2012  . INTERSTITIAL CYSTITIS 01/01/2010  . SWELLING, NECK 01/01/2010  . CAD, NATIVE VESSEL 12/19/2009  . Palpitations 11/21/2009  . ABNORMAL CV (STRESS) TEST 11/21/2009  . Essential hypertension 10/31/2009  . ELECTROCARDIOGRAM, ABNORMAL 10/25/2009  . PLANTAR FASCIITIS, BILATERAL 10/12/2009  . CERVICAL RADICULOPATHY 07/04/2008  . Pain in soft tissues of limb 03/25/2008  . DEGENERATIVE DISC DISEASE, CERVICAL SPINE, W/RADICULOPATHY 09/08/2007  . LOW BACK PAIN 09/08/2007  . TOBACCO USE 03/23/2007  . OSTEOARTHROSIS, LOCAL NOS, LOWER LEG 11/07/2006  . STATE, SYMPTOMATIC MENOPAUSE/FEM CLIMACTERIC 10/27/2006  . Depression, recurrent ([place]) 09/15/2006  . Thoracic or lumbosacral neuritis or radiculitis, unspecified 09/15/2006  . IBS 09/09/2006  . STRICTURE, ESOPHAGEAL 03/16/2001    Immunization History  Administered Date(s) Administered  . Influenza Split 12/16/2011  . Influenza Whole 11/15/2008, 10/25/2009  . Influenza,inj,Quad PF,6+ Mos 11/18/2012, 11/29/2013, 11/08/2014, 03/14/2017  . Janssen (J&J) SARS-COV-2 Vaccination 06/12/2019  . Td 10/25/2009    Conditions to be addressed/monitored:  {USCCMDZASSESSMENTOPTIONS:23563}  There are no care plans that you recently modified to display for this patient.    Medication Assistance: {MEDASSISTANCEINFO:25044}  Patient's preferred pharmacy  is:  [facility] - [place], [place] - [address] [place] [address] Phone: [phone] Fax: [place], [place] [address] Phone: [phone] Fax: [phone]  Uses pill box? {Yes or If no, why not?:20788} Pt endorses ***% compliance  We discussed: {Pharmacy options:24294} Patient decided to: {US Pharmacy Plan:23885}  Care Plan and Follow Up Patient Decision:  {FOLLOWUP:24991}  Plan: {CM FOLLOW UP PLAN:25073}  ***

## 2020-04-04 NOTE — Telephone Encounter (Signed)
Rx done. 

## 2020-04-10 ENCOUNTER — Telehealth: Payer: Self-pay

## 2020-04-10 ENCOUNTER — Ambulatory Visit (INDEPENDENT_AMBULATORY_CARE_PROVIDER_SITE_OTHER): Payer: Medicare Other | Admitting: Pharmacist

## 2020-04-10 DIAGNOSIS — I10 Essential (primary) hypertension: Secondary | ICD-10-CM | POA: Diagnosis not present

## 2020-04-10 DIAGNOSIS — E0590 Thyrotoxicosis, unspecified without thyrotoxic crisis or storm: Secondary | ICD-10-CM

## 2020-04-10 MED ORDER — BLOOD PRESSURE MONITORING DEVI: 1 refills | Status: DC

## 2020-04-10 MED ORDER — ALBUTEROL SULFATE HFA 108 (90 BASE) MCG/ACT IN AERS: 2.0000 | INHALATION_SPRAY | Freq: Four times a day (QID) | RESPIRATORY_TRACT | 1 refills | Status: DC | PRN

## 2020-04-10 NOTE — Telephone Encounter (Signed)
Albuterol sent to pharmacy. 

## 2020-04-10 NOTE — Progress Notes (Signed)
Chronic Care Management Pharmacy Note  04/10/2020 Name:  Laurie [name] MRN:  [id number] DOB:  1955/07/24  Subjective: Laurie [name] is an 65 y.o. year old female who is a primary patient of Burchette, Alinda Sierras, MD.  The CCM team was consulted for assistance with disease management and care coordination needs.    Engaged with patient by telephone for follow up visit in response to provider referral for pharmacy case management and/or care coordination services.   Consent to Services:  The patient was given information about Chronic Care Management services, agreed to services, and gave verbal consent prior to initiation of services.  Please see initial visit note for detailed documentation.   Patient Care Team: [name], MD as PCP - General (Family Medicine) Burnell Blanks, MD as PCP - Cardiology (Cardiology) Viona Gilmore, [facility] as Pharmacist (Pharmacist)  Recent office visits: 02/23/20 Grier Mitts, MD: Patient presented for office visit for dysuria. Urine culture was negative for growth.  01/05/20 [name], MD: Patient presented for video visit for sinusitis. Prescribed albuterol inhaler PRN.  Recent consult visits: 03/13/20 Renato Shin, MD (endo): Patient presented for hyperthyroidism. TSH still low but T4 is normal. Increased metoprolol to 50 mg daily.   02/28/20 Lauree Chandler, MD (cardiology): Patient presented for CAD follow up.   02/17/20 Renato Shin, MD (endo): Patient presented for hyperthyroidism initial visit. Prescribed methimazole 20 mg BID and metoprolol succinate 25 mg daily.   Hospital visits: None in previous 6 months  Objective:  Lab Results  Component Value Date   CREATININE 0.88 02/23/2020   BUN 20 02/23/2020   GFR 69.04 02/23/2020   GFRNONAA 66 09/27/2019   GFRAA 76 09/27/2019   NA 136 02/23/2020   K 4.2 02/23/2020   CALCIUM 9.3 02/23/2020   CO2 27 02/23/2020    Lab Results  Component Value Date/Time    HGBA1C 5.4 09/13/2018 03:11 AM   GFR 69.04 02/23/2020 05:01 PM   GFR 57.02 (L) 07/23/2019 02:41 PM    Last diabetic Eye exam: No results found for: HMDIABEYEEXA  Last diabetic Foot exam: No results found for: HMDIABFOOTEX   Lab Results  Component Value Date   CHOL 159 09/03/2019   HDL 46 (L) 09/03/2019   LDLCALC 81 09/03/2019   LDLDIRECT 123.0 03/14/2017   TRIG 227 (H) 09/03/2019   CHOLHDL 3.5 09/03/2019    Hepatic Function Latest Ref Rng & Units 02/23/2020 09/03/2019 07/23/2019  Total Protein 6.0 - 8.3 g/dL 6.7 6.9 7.0  Albumin 3.5 - 5.2 g/dL 3.7 - 4.2  AST 0 - 37 U/L 12 9(L) 11  ALT 0 - 35 U/L 14 14 13   Alk Phosphatase 39 - 117 U/L 77 - 76  Total Bilirubin 0.2 - 1.2 mg/dL 0.3 0.5 0.3  Bilirubin, Direct 0.0 - 0.2 mg/dL - 0.1 -    Lab Results  Component Value Date/Time   TSH <0.01 Repeated and verified X2. (L) 03/13/2020 01:30 PM   TSH <0.01 (L) 12/27/2019 09:11 AM   FREET4 1.56 03/13/2020 01:30 PM   FREET4 2.0 (H) 12/27/2019 09:11 AM    CBC Latest Ref Rng & Units 02/23/2020 09/27/2019 08/30/2019  WBC 4.0 - 10.5 K/uL 6.9 10.1 10.4  Hemoglobin 12.0 - 15.0 g/dL 11.9(L) 13.1 11.9(L)  Hematocrit 36.0 - 46.0 % 35.3(L) 39.7 37.5  Platelets 150.0 - 400.0 K/uL 206.0 283 176    Lab Results  Component Value Date/Time   VD25OH 38.94 03/14/2017 09:48 AM   VD25OH 16.55 (L) 08/18/2015  08:36 AM    Clinical ASCVD: Yes  The 10-year ASCVD risk score Mikey Bussing DC Jr., et al., 2013) is: 23.1%   Values used to calculate the score:     Age: 79 years     Sex: Female     Is Non-Hispanic African American: No     Diabetic: No     Tobacco smoker: Yes     Systolic Blood Pressure: 675 mmHg     Is BP treated: Yes     HDL Cholesterol: 46 mg/dL     Total Cholesterol: 159 mg/dL    Depression screen PHQ 2/9 11/24/2019  Decreased Interest 3  Down, Depressed, Hopeless 2  PHQ - 2 Score 5  Altered sleeping 3  Tired, decreased energy 3  Change in appetite 1  Feeling bad or failure about yourself   2  Trouble concentrating 2  Moving slowly or fidgety/restless 2  Suicidal thoughts 1  PHQ-9 Score 19  Some recent data might be hidden     Social History   Tobacco Use  Smoking Status Current Every Day Smoker  . Packs/day: 0.50  . Types: Cigarettes  Smokeless Tobacco Never Used  Tobacco Comment   trying to quit    BP Readings from Last 3 Encounters:  03/13/20 (!) 176/60  02/23/20 132/70  02/17/20 (!) 142/78   Pulse Readings from Last 3 Encounters:  03/13/20 91  02/23/20 92  02/17/20 88   Wt Readings from Last 3 Encounters:  03/13/20 156 lb 3.2 oz (70.9 kg)  02/28/20 155 lb (70.3 kg)  02/23/20 156 lb (70.8 kg)    Assessment/Interventions: Review of patient past medical history, allergies, medications, health status, including review of consultants reports, laboratory and other test data, was performed as part of comprehensive evaluation and provision of chronic care management services.   SDOH:  (Social Determinants of Health) assessments and interventions performed: No   CCM Care Plan  Allergies  Allergen Reactions  . Codeine Nausea And Vomiting  . Lisinopril Cough  . Sulfonamide Derivatives Nausea And Vomiting  . Tetracyclines & Related Rash    Medications Reviewed Today    Reviewed by Casandra Doffing, CMA (Certified Medical Assistant) on 03/13/20 at 1315  Med List Status: <None>  Medication Order Taking? Sig Documenting Provider Last Dose Status Informant  albuterol (VENTOLIN HFA) 108 (90 Base) MCG/ACT inhaler [id number] Yes Inhale 2 puffs into the lungs every 6 (six) hours as needed for wheezing or shortness of breath. [name], MD Taking Active   aspirin EC 81 MG tablet [id number] Yes Take 1 tablet (81 mg total) by mouth daily. Swallow whole. Burnell Blanks, MD Taking Active Self  atorvastatin (LIPITOR) 40 MG tablet [id number] Yes Take 1 tablet (40 mg total) by mouth daily. Burnell Blanks, MD Taking Active   clopidogrel (PLAVIX)  75 MG tablet [id number] Yes Take 4 tablets the first day then one tablet by mouth daily Burnell Blanks, MD Taking Active   fluticasone [facility]) 50 MCG/ACT nasal spray [id number] Yes Place 2 sprays into both nostrils daily. [name], MD Taking Active   guaiFENesin (MUCINEX) 600 MG 12 hr tablet [id number] Yes Take 600 mg by mouth 2 (two) times daily as needed. [provider] Taking Active   losartan (COZAAR) 50 MG tablet [id number] Yes Take 1 tablet (50 mg total) by mouth daily. [name], MD Taking Active   Menthol, Topical Analgesic, [facility]) [id number] Yes Apply 1 application topically daily as needed (back pain).  [provider] Taking Active Self  methimazole (TAPAZOLE) 10 MG tablet [id number] Yes Take 2 tablets (20 mg total) by mouth 2 (two) times daily. Renato Shin, MD Taking Active   metoprolol succinate (TOPROL-XL) 25 MG 24 hr tablet [id number] Yes Take 1 tablet (25 mg total) by mouth daily. Renato Shin, MD Taking Active   nitroGLYCERIN (NITROSTAT) 0.4 MG SL tablet [id number]  Place 1 tablet (0.4 mg total) under the tongue every 5 (five) minutes as needed for chest pain. Furth, Cadence H, PA-C  Expired 01/17/20 2359   pantoprazole (PROTONIX) 40 MG tablet [id number] Yes TAKE 1 TABLET BY MOUTH DAILY Isaac Bliss, Rayford Halsted, MD Taking Active   venlafaxine XR (EFFEXOR-XR) 150 MG 24 hr capsule [id number] Yes TAKE ONE CAPSULE BY MOUTH EVERY MORNING WITH BREAKFAST Burchette, Alinda Sierras, MD Taking Active           Patient Active Problem List   Diagnosis Date Noted  . Hyperthyroidism 02/19/2020  . Coronary artery disease involving native coronary artery of native heart with unstable angina pectoris ([place])   . Substance abuse ([place]) 07/23/2019  . Renal insufficiency 09/10/2018  . Elevated troponin 09/10/2018  . Leukocytosis 09/10/2018  . Hematemesis 09/09/2018  . Opiate withdrawal ([place]) 09/09/2018  . Vitamin D deficiency 06/10/2018  . Shortness of  breath 11/05/2017  . Elevated brain natriuretic peptide (BNP) level 11/05/2017  . Polysubstance (excluding opioids) dependence, daily use ([place]) 11/05/2017  . AKI (acute kidney injury) ([place]) 11/05/2017  . Low serum vitamin D 08/23/2015  . Hyperlipidemia 11/08/2014  . Reflux esophagitis 03/23/2014  . Dysphagia, pharyngoesophageal phase 03/23/2014  . Pain in the chest 03/23/2014  . Special screening for malignant neoplasms, colon 07/09/2013  . Chest pain 04/23/2012  . Dyspnea 04/23/2012  . Murmur 04/23/2012  . INTERSTITIAL CYSTITIS 01/01/2010  . SWELLING, NECK 01/01/2010  . CAD, NATIVE VESSEL 12/19/2009  . Palpitations 11/21/2009  . ABNORMAL CV (STRESS) TEST 11/21/2009  . Essential hypertension 10/31/2009  . ELECTROCARDIOGRAM, ABNORMAL 10/25/2009  . PLANTAR FASCIITIS, BILATERAL 10/12/2009  . CERVICAL RADICULOPATHY 07/04/2008  . Pain in soft tissues of limb 03/25/2008  . DEGENERATIVE DISC DISEASE, CERVICAL SPINE, W/RADICULOPATHY 09/08/2007  . LOW BACK PAIN 09/08/2007  . TOBACCO USE 03/23/2007  . OSTEOARTHROSIS, LOCAL NOS, LOWER LEG 11/07/2006  . STATE, SYMPTOMATIC MENOPAUSE/FEM CLIMACTERIC 10/27/2006  . Depression, recurrent ([place]) 09/15/2006  . Thoracic or lumbosacral neuritis or radiculitis, unspecified 09/15/2006  . IBS 09/09/2006  . STRICTURE, ESOPHAGEAL 03/16/2001    Immunization History  Administered Date(s) Administered  . Influenza Split 12/16/2011  . Influenza Whole 11/15/2008, 10/25/2009  . Influenza,inj,Quad PF,6+ Mos 11/18/2012, 11/29/2013, 11/08/2014, 03/14/2017  . Janssen (J&J) SARS-COV-2 Vaccination 06/12/2019  . Td 10/25/2009   Patient reports she noticed that she ran out of Plavix and hasn't been taking for the past 2 weeks.   Conditions to be addressed/monitored:  Hypertension, Hyperlipidemia, Coronary Artery Disease, Depression, Tobacco use, Allergic Rhinitis and chronic pain  Care Plan : CCM Pharmacy Care Plan  Updates made by Viona Gilmore, [place]  since 04/10/2020 12:00 AM    Problem: Problem: Hypertension, Hyperlipidemia, Coronary Artery Disease, Depression, Tobacco use, Allergic Rhinitis and chronic pain     Long-Range Goal: Patient-Specific Goal   Start Date: 04/10/2020  Expected End Date: 04/10/2021  This Visit's Progress: On track  Priority: High  Note:   Current Barriers:  . Unable to independently monitor therapeutic efficacy . Unable to achieve control of blood pressure  . Unable to self administer medications as prescribed  Pharmacist Clinical Goal(s):  Marland Kitchen Over the next 90 days, patient will achieve control of blood pressure as evidenced by home blood pressure readings . adhere to plan to optimize therapeutic regimen for blood thinners as evidenced by report of adherence to recommended medication management changes through collaboration with PharmD and provider.   Interventions: . 1:1 collaboration with [name], MD regarding development and update of comprehensive plan of care as evidenced by provider attestation and co-signature . Inter-disciplinary care team collaboration (see longitudinal plan of care) . Comprehensive medication review performed; medication list updated in electronic medical record  Hypertension (BP goal <130/80) -Uncontrolled -Current treatment: . Metoprolol succinate 50 mg 1 tablet daily . Losartan 50 mg 1 tablet daily -Medications previously tried: none  -Current home readings: could not provide as patient does not have a BP cuff -Current dietary habits: did not discuss -Current exercise habits: did not discuss -Denies hypotensive/hypertensive symptoms -Educated on Importance of home blood pressure monitoring; Proper BP monitoring technique; -Counseled to monitor BP at home twice weekly, document, and provide log at future appointments -Recommended to continue current medication Counseled on obtaining a blood pressure cuff from insurance or the pharmacy  Hyperlipidemia: (LDL goal  < 70) -Uncontrolled -Current treatment: . Atorvastatin 40 mg 1 tablet daily -Medications previously tried: none  -Current dietary patterns: did not discuss -Current exercise habits: did not discuss -Educated on Cholesterol goals;  Importance of limiting foods high in cholesterol; -Recommended to continue current medication  Depression (Goal: minimize symptoms) -Controlled -Current treatment: . Venlafaxine XR 150 mg 1 capsule in the morning with breakfast -Medications previously tried/failed:  Xanax, Elavil, Wellbutrin, Citalopram, Pristiq, Valium, Cymbalta  -PHQ9: 19 (11/24/19) -Educated on Benefits of medication for symptom control Benefits of cognitive-behavioral therapy with or without medication -Recommended to continue current medication  Coronary artery disease (Goal: prevent heart events) -Controlled -Current treatment  . Aspirin 81 mg daily  . Clopidogrel 75 mg daily -Medications previously tried: Brilinta (shortness of breath)  -Recommended to continue current medication Counseled on making sure to call the pharmacy if she is out of medication to avoid gaps of not taking clopidogrel  Tobacco use (Goal quit smoking) -Uncontrolled -Previous quit attempts: Wellbutrin -Current treatment  . No medications -Patient smokes After 30 minutes of waking -Patient triggers include: stress -On a scale of 1-10, reports MOTIVATION to quit is 8 -On a scale of 1-10, reports CONFIDENCE in quitting is 5 - Plan to reassess willingness to quit at follow up  -Recommended to continue  GERD (Goal: minimize symptoms of heartburn or acid reflux) -Uncontrolled -Current treatment  . Pantoprazole 40 mg daily -Medications previously tried: none  -Recommended to continue current medication Counseled on non-pharmacologic management of symptoms such as elevating the head of your bed, avoiding eating 2-3 hours before bed, avoiding triggering foods such as acidic, spicy, or fatty foods, eating  smaller meals, and wearing clothes that are loose around the waist  Allergic rhinitis (Goal: minimize symptoms of allergies) -Controlled -Current treatment  . Mucinex 600 mg twice daily as needed . Flonase 50 mcg/act 2 spray daily as needed -Medications previously tried: none -Recommended to continue current medication  Hyperthryroidism (Goal: TSH 0.35-4.5) -Uncontrolled -Current treatment  . Methimazole 10 mg 2 tablets by mouth twice daily -Medications previously tried: none  -Recommended to continue current medication Counseled on moving bedtime administration to a few hours earlier to see if this helps with sleep  Health Maintenance -Vaccine gaps: shingles, tetanus, COVID booster, Prevnar 20 -Current therapy:  .  BioFreeze Ex daily as needed . Nitroglycerin 0.4 mg as needed -Educated on Cost vs benefit of each product must be carefully weighed by individual consumer -Patient is satisfied with current therapy and denies issues -Recommended to continue current medication  Patient Goals/Self-Care Activities . Over the next 90 days, patient will:  - take medications as prescribed focus on medication adherence by setting an alarm to remember to take medications on time check blood pressure twice weekly, document, and provide at future appointments  Follow Up Plan: Telephone follow up appointment with care management team member scheduled for: 3 months     Medication Assistance: None required.  Patient affirms current coverage meets needs.  Patient's preferred pharmacy is:  [facility] - [place], [place] - [address] [place] [address] Phone: [phone] Fax: [place], [place] [address] Phone: [phone] Fax: [phone]  Uses pill box? No - adherence packaging Pt endorses 80% compliance - has not been  taking clopidogrel x 2 weeks  We discussed: Benefits of medication synchronization, packaging and delivery as well as enhanced pharmacist oversight with [facility]. Patient decided to: Utilize [facility] pharmacy for medication synchronization, packaging and delivery  Care Plan and Follow Up Patient Decision:  Patient agrees to Care Plan and Follow-up.  Plan: Telephone follow up appointment with care management team member scheduled for:   3 months  Jeni Salles, PharmD [place] Pharmacist [place] [facility] [place] [phone]

## 2020-04-10 NOTE — Patient Instructions (Addendum)
Hi [name],  It was lovely meeting you over the phone! Below is a summary of the topics we addressed. Please look into getting a blood pressure cuff from your insurance's catalog if it is not covered at the pharmacy. This is very important to check at home a couple times a week as you have been having some high office blood pressures. As a reminder, go ahead and try melatonin 2 or 3 mg to see if this helps with your sleep if moving the methimazole a few hours back doesn't help.  Please let me know if you need anything or have any questions from me before our follow up!  Best, Maddie  Jeni Salles, PharmD [facility] Clinical Pharmacist [place] [facility] [place]   Visit Information  Goals Addressed   None    Patient Care Plan: CCM Pharmacy Care Plan    Problem Identified: Problem: Hypertension, Hyperlipidemia, Coronary Artery Disease, Depression, Tobacco use, Allergic Rhinitis and chronic pain     Long-Range Goal: Patient-Specific Goal   Start Date: 04/10/2020  Expected End Date: 04/10/2021  This Visit's Progress: On track  Priority: High  Note:   Current Barriers:  . Unable to independently monitor therapeutic efficacy . Unable to achieve control of blood pressure  . Unable to self administer medications as prescribed   Pharmacist Clinical Goal(s):  Marland Kitchen Over the next 90 days, patient will achieve control of blood pressure as evidenced by home blood pressure readings . adhere to plan to optimize therapeutic regimen for blood thinners as evidenced by report of adherence to recommended medication management changes through collaboration with PharmD and provider.   Interventions: . 1:1 collaboration with [name], MD regarding development and update of comprehensive plan of care as evidenced by provider attestation and co-signature . Inter-disciplinary care team collaboration (see longitudinal plan of care) . Comprehensive medication review performed;  medication list updated in electronic medical record  Hypertension (BP goal <130/80) -Uncontrolled -Current treatment: . Metoprolol succinate 50 mg 1 tablet daily . Losartan 50 mg 1 tablet daily -Medications previously tried: none  -Current home readings: could not provide as patient does not have a BP cuff -Current dietary habits: did not discuss -Current exercise habits: did not discuss -Denies hypotensive/hypertensive symptoms -Educated on Importance of home blood pressure monitoring; Proper BP monitoring technique; -Counseled to monitor BP at home twice weekly, document, and provide log at future appointments -Recommended to continue current medication Counseled on obtaining a blood pressure cuff from insurance or the pharmacy  Hyperlipidemia: (LDL goal < 70) -Uncontrolled -Current treatment: . Atorvastatin 40 mg 1 tablet daily -Medications previously tried: none  -Current dietary patterns: did not discuss -Current exercise habits: did not discuss -Educated on Cholesterol goals;  Importance of limiting foods high in cholesterol; -Recommended to continue current medication  Depression (Goal: minimize symptoms) -Controlled -Current treatment: . Venlafaxine XR 150 mg 1 capsule in the morning with breakfast -Medications previously tried/failed:  Xanax, Elavil, Wellbutrin, Citalopram, Pristiq, Valium, Cymbalta  -PHQ9: 19 (11/24/19) -Educated on Benefits of medication for symptom control Benefits of cognitive-behavioral therapy with or without medication -Recommended to continue current medication  Coronary artery disease (Goal: prevent heart events) -Controlled -Current treatment  . Aspirin 81 mg daily  . Clopidogrel 75 mg daily -Medications previously tried: Brilinta (shortness of breath)  -Recommended to continue current medication Counseled on making sure to call the pharmacy if she is out of medication to avoid gaps of not taking clopidogrel  Tobacco use (Goal quit  smoking) -Uncontrolled -  Previous quit attempts: Wellbutrin -Current treatment  . No medications -Patient smokes After 30 minutes of waking -Patient triggers include: stress -On a scale of 1-10, reports MOTIVATION to quit is 8 -On a scale of 1-10, reports CONFIDENCE in quitting is 5 - Plan to reassess willingness to quit at follow up  -Recommended to continue  GERD (Goal: minimize symptoms of heartburn or acid reflux) -Uncontrolled -Current treatment  . Pantoprazole 40 mg daily -Medications previously tried: none  -Recommended to continue current medication Counseled on non-pharmacologic management of symptoms such as elevating the head of your bed, avoiding eating 2-3 hours before bed, avoiding triggering foods such as acidic, spicy, or fatty foods, eating smaller meals, and wearing clothes that are loose around the waist  Allergic rhinitis (Goal: minimize symptoms of allergies) -Controlled -Current treatment  . Mucinex 600 mg twice daily as needed . Flonase 50 mcg/act 2 spray daily as needed -Medications previously tried: none -Recommended to continue current medication  Hyperthryroidism (Goal: TSH 0.35-4.5) -Uncontrolled -Current treatment  . Methimazole 10 mg 2 tablets by mouth twice daily -Medications previously tried: none  -Recommended to continue current medication Counseled on moving bedtime administration to a few hours earlier to see if this helps with sleep  Health Maintenance -Vaccine gaps: shingles, tetanus, COVID booster, Prevnar 20 -Current therapy:  . BioFreeze Ex daily as needed . Nitroglycerin 0.4 mg as needed -Educated on Cost vs benefit of each product must be carefully weighed by individual consumer -Patient is satisfied with current therapy and denies issues -Recommended to continue current medication  Patient Goals/Self-Care Activities . Over the next 90 days, patient will:  - take medications as prescribed focus on medication adherence by  setting an alarm to remember to take medications on time check blood pressure twice weekly, document, and provide at future appointments  Follow Up Plan: Telephone follow up appointment with care management team member scheduled for: 3 months       Patient verbalizes understanding of instructions provided today and agrees to view in [place].   Telephone follow up appointment with pharmacy team member scheduled for: 3 months  Viona Gilmore, [facility]  How to Take Your Blood Pressure Blood pressure is a measurement of how strongly your blood is pressing against the walls of your arteries. Arteries are blood vessels that carry blood from your heart throughout your body. Your health care provider takes your blood pressure at each office visit. You can also take your own blood pressure at home with a blood pressure monitor. You may need to take your own blood pressure to:  Confirm a diagnosis of high blood pressure (hypertension).  Monitor your blood pressure over time.  Make sure your blood pressure medicine is working. Supplies needed:  Blood pressure monitor.  Dining room chair to sit in.  Table or desk.  Small notebook and pencil or pen. How to prepare To get the most accurate reading, avoid the following for 30 minutes before you check your blood pressure:  Drinking caffeine.  Drinking alcohol.  Eating.  Smoking.  Exercising. Five minutes before you check your blood pressure:  Use the bathroom and urinate so that you have an empty bladder.  Sit quietly in a dining room chair. Do not sit in a soft couch or an armchair. Do not talk. How to take your blood pressure To check your blood pressure, follow the instructions in the manual that came with your blood pressure monitor. If you have a digital blood pressure monitor, the instructions  may be as follows: 1. Sit up straight in a chair. 2. Place your feet on the floor. Do not cross your ankles or legs. 3. Rest your left  arm at the level of your heart on a table or desk or on the arm of a chair. 4. Pull up your shirt sleeve. 5. Wrap the blood pressure cuff around the upper part of your left arm, 1 inch (2.5 cm) above your elbow. It is best to wrap the cuff around bare skin. 6. Fit the cuff snugly around your arm. You should be able to place only one finger between the cuff and your arm. 7. Position the cord so that it rests in the bend of your elbow. 8. Press the power button. 9. Sit quietly while the cuff inflates and deflates. 10. Read the digital reading on the monitor screen and write the numbers down (record them) in a notebook. 11. Wait 2-3 minutes, then repeat the steps, starting at step 1.   What does my blood pressure reading mean? A blood pressure reading consists of a higher number over a lower number. Ideally, your blood pressure should be below 120/80. The first ("top") number is called the systolic pressure. It is a measure of the pressure in your arteries as your heart beats. The second ("bottom") number is called the diastolic pressure. It is a measure of the pressure in your arteries as the heart relaxes. Blood pressure is classified into five stages. The following are the stages for adults who do not have a short-term serious illness or a chronic condition. Systolic pressure and diastolic pressure are measured in a unit called mm Hg (millimeters of mercury).  Normal  Systolic pressure: below 829.  Diastolic pressure: below 80. Elevated  Systolic pressure: 562-130.  Diastolic pressure: below 80. Hypertension stage 1  Systolic pressure: 865-784.  Diastolic pressure: 69-62. Hypertension stage 2  Systolic pressure: 952 or above.  Diastolic pressure: 90 or above. You can have elevated blood pressure or hypertension even if only the systolic or only the diastolic number in your reading is higher than normal. Follow these instructions at home:  Check your blood pressure as often as  recommended by your health care provider.  Check your blood pressure at the same time every day.  Take your monitor to the next appointment with your health care provider to make sure that: ? You are using it correctly. ? It provides accurate readings.  Be sure you understand what your goal blood pressure numbers are.  Tell your health care provider if you are having any side effects from blood pressure medicine.  Keep all follow-up visits as told by your health care provider. This is important. General tips  Your health care provider can suggest a reliable monitor that will meet your needs. There are several types of home blood pressure monitors.  Choose a monitor that has an arm cuff. Do not choose a monitor that measures your blood pressure from your wrist or finger.  Choose a cuff that wraps snugly around your upper arm. You should be able to fit only one finger between your arm and the cuff.  You can buy a blood pressure monitor at most drugstores or online. Where to find more information American Heart Association: www.heart.org Contact a health care provider if:  Your blood pressure is consistently high. Get help right away if:  Your systolic blood pressure is higher than 180.  Your diastolic blood pressure is higher than 120. Summary  Blood pressure  is a measurement of how strongly your blood is pressing against the walls of your arteries.  A blood pressure reading consists of a higher number over a lower number. Ideally, your blood pressure should be below 120/80.  Check your blood pressure at the same time every day.  Avoid caffeine, alcohol, smoking, and exercise for 30 minutes prior to checking your blood pressure. These agents can affect the accuracy of the blood pressure reading. This information is not intended to replace advice given to you by your health care provider. Make sure you discuss any questions you have with your health care provider. Document  Revised: 01/22/2019 Document Reviewed: 01/22/2019 Elsevier Patient Education  2021 [employer].

## 2020-04-10 NOTE — Telephone Encounter (Signed)
Order for blood pressure cuff sent to [facility].

## 2020-04-10 NOTE — Telephone Encounter (Signed)
-----   Message from Viona Gilmore, [facility] sent at 04/10/2020 11:41 AM EST ----- Regarding: BP cuff prescription Hi,  Can you please send a blood pressure cuff prescription to [facility] pharmacy to see if it will be covered by Ms. Neubecker's insurance?  Thank you, Maddie

## 2020-04-10 NOTE — Telephone Encounter (Signed)
-----   Message from Viona Gilmore, [facility] sent at 04/10/2020 10:44 AM EST ----- Regarding: Albuterol refill Hi,  Ms. Brigid Vandekamp has run out of albuterol on hand. Can you please send a refill of Albuterol to [facility] pharmacy?  Thank you, Maddie

## 2020-04-12 ENCOUNTER — Other Ambulatory Visit: Payer: Self-pay

## 2020-04-14 ENCOUNTER — Ambulatory Visit: Payer: Medicare Other | Admitting: Endocrinology

## 2020-04-17 ENCOUNTER — Ambulatory Visit: Payer: Medicare Other | Admitting: Endocrinology

## 2020-04-26 ENCOUNTER — Telehealth: Payer: Self-pay | Admitting: Endocrinology

## 2020-04-26 ENCOUNTER — Ambulatory Visit (INDEPENDENT_AMBULATORY_CARE_PROVIDER_SITE_OTHER): Payer: Medicare Other | Admitting: Endocrinology

## 2020-04-26 ENCOUNTER — Other Ambulatory Visit: Payer: Self-pay

## 2020-04-26 VITALS — BP 150/70 | HR 75 | Ht 70.0 in | Wt 165.4 lb

## 2020-04-26 DIAGNOSIS — E0590 Thyrotoxicosis, unspecified without thyrotoxic crisis or storm: Secondary | ICD-10-CM | POA: Diagnosis not present

## 2020-04-26 DIAGNOSIS — H5789 Other specified disorders of eye and adnexa: Secondary | ICD-10-CM

## 2020-04-26 LAB — T4, FREE: Free T4: 0.69 ng/dL (ref 0.60–1.60)

## 2020-04-26 LAB — TSH: TSH: 0.04 u[IU]/mL — ABNORMAL LOW (ref 0.35–4.50)

## 2020-04-26 MED ORDER — METHIMAZOLE 10 MG PO TABS: 20.0000 mg | ORAL_TABLET | Freq: Every day | ORAL | 1 refills | Status: DC

## 2020-04-26 NOTE — Progress Notes (Signed)
Subjective:    Patient ID: Laurie [name], female    DOB: Mar 12, 1955, 65 y.o.   MRN: [id number]  HPI Pt returns for f/u of hyperthyroidism.   Dx'ed: 2021 Imaging: never.   Current rx: tapazole.   Other: tapazole is chosen as initial rx, due to severity.   Interval hx: main symptom is insomnia.  She sometimes misses the tapazole.   Past Medical History:  Diagnosis Date  . Allergy   . Anxiety   . Arthritis    RA  . Asthma   . Chronic back pain   . COPD (chronic obstructive pulmonary disease) ([place])   . Depression   . Esophageal stricture   . GERD (gastroesophageal reflux disease)   . Heart murmur   . Hematemesis 09/10/2018  . Hyperlipidemia   . Hypertension   . IBS (irritable bowel syndrome)   . Interstitial cystitis   . Neuromuscular disorder ([place])    fibromyalgia    Past Surgical History:  Procedure Laterality Date  . ABDOMINAL HYSTERECTOMY    . APPENDECTOMY    . BIOPSY  09/11/2018   Procedure: BIOPSY;  Surgeon: Rush Landmark Telford Nab., MD;  Location: [place];  Service: Gastroenterology;;  . CERVICAL FUSION    . CERVICAL LAMINECTOMY    . CORONARY STENT INTERVENTION N/A 10/07/2019   Procedure: CORONARY STENT INTERVENTION;  Surgeon: Burnell Blanks, MD;  Location: [place] [facility];  Service: Cardiovascular;  Laterality: N/A;  . ESOPHAGOGASTRODUODENOSCOPY    . ESOPHAGOGASTRODUODENOSCOPY (EGD) WITH PROPOFOL N/A 09/11/2018   Procedure: ESOPHAGOGASTRODUODENOSCOPY (EGD) WITH PROPOFOL;  Surgeon: Rush Landmark Telford Nab., MD;  Location: [place];  Service: Gastroenterology;  Laterality: N/A;  . FINGER SURGERY    . LEFT HEART CATH AND CORONARY ANGIOGRAPHY N/A 10/07/2019   Procedure: LEFT HEART CATH AND CORONARY ANGIOGRAPHY;  Surgeon: Burnell Blanks, MD;  Location: [place] [facility];  Service: Cardiovascular;  Laterality: N/A;  . LUMBAR FUSION    . SAVORY DILATION N/A 09/11/2018   Procedure: SAVORY DILATION;  Surgeon: Rush Landmark Telford Nab., MD;   Location: [place];  Service: Gastroenterology;  Laterality: N/A;  . TONSILLECTOMY      Social History   Socioeconomic History  . Marital status: Single    Spouse name: Not on file  . Number of children: Not on file  . Years of education: Not on file  . Highest education level: Not on file  Occupational History  . Occupation: disability  Tobacco Use  . Smoking status: Current Every Day Smoker    Packs/day: 0.50    Types: Cigarettes  . Smokeless tobacco: Never Used  . Tobacco comment: trying to quit   Vaping Use  . Vaping Use: Never used  Substance and Sexual Activity  . Alcohol use: No    Alcohol/week: 0.0 standard drinks  . Drug use: No  . Sexual activity: Not on file  Other Topics Concern  . Not on file  Social History Narrative  . Not on file   Social Determinants of Health   Financial Resource Strain: Low Risk   . Difficulty of Paying Living Expenses: Not very hard  Food Insecurity: Not on file  Transportation Needs: No Transportation Needs  . Lack of Transportation (Medical): No  . Lack of Transportation (Non-Medical): No  Physical Activity: Not on file  Stress: Not on file  Social Connections: Not on file  Intimate Partner Violence: Not on file    Current Outpatient Medications on File Prior to Visit  Medication Sig Dispense  Refill  . albuterol (VENTOLIN HFA) 108 (90 Base) MCG/ACT inhaler Inhale 2 puffs into the lungs every 6 (six) hours as needed for wheezing or shortness of breath. 18 g 1  . aspirin EC 81 MG tablet Take 1 tablet (81 mg total) by mouth daily. Swallow whole. 90 tablet 3  . atorvastatin (LIPITOR) 40 MG tablet Take 1 tablet (40 mg total) by mouth daily. 90 tablet 3  . Blood Pressure Monitoring DEVI Use as needed DX I10 1 each 1  . clopidogrel (PLAVIX) 75 MG tablet Take 4 tablets the first day then one tablet by mouth daily 90 tablet 3  . fluticasone (FLONASE) 50 MCG/ACT nasal spray Place 2 sprays into both nostrils daily. 16 g 6  .  guaiFENesin (MUCINEX) 600 MG 12 hr tablet Take 600 mg by mouth 2 (two) times daily as needed.    Marland Kitchen losartan (COZAAR) 50 MG tablet Take 1 tablet (50 mg total) by mouth daily. 90 tablet 3  . Menthol, Topical Analgesic, (BIOFREEZE EX) Apply 1 application topically daily as needed (back pain).    . metoprolol succinate (TOPROL-XL) 50 MG 24 hr tablet Take 1 tablet (50 mg total) by mouth daily. Take with or immediately following a meal. 90 tablet 3  . pantoprazole (PROTONIX) 40 MG tablet Take 1 tablet (40 mg total) by mouth daily. 90 tablet 0  . venlafaxine XR (EFFEXOR-XR) 150 MG 24 hr capsule TAKE ONE CAPSULE BY MOUTH EVERY MORNING WITH BREAKFAST 30 capsule 1  . nitroGLYCERIN (NITROSTAT) 0.4 MG SL tablet Place 1 tablet (0.4 mg total) under the tongue every 5 (five) minutes as needed for chest pain. 25 tablet 1   No current facility-administered medications on file prior to visit.    Allergies  Allergen Reactions  . Codeine Nausea And Vomiting  . Lisinopril Cough  . Sulfonamide Derivatives Nausea And Vomiting  . Tetracyclines & Related Rash    Family History  Problem Relation Age of Onset  . Dementia Mother   . Heart attack Father   . Coronary artery disease Father   . Cancer Brother        esophageal  . Esophageal cancer Brother   . Coronary artery disease Paternal Aunt   . Stomach cancer Paternal Aunt   . Coronary artery disease Paternal Grandmother   . Aneurysm Brother        aortic  . Rectal cancer Neg Hx   . Colon cancer Neg Hx   . Thyroid disease Neg Hx     BP (!) 150/70 (BP Location: Right Arm, Patient Position: Sitting, Cuff Size: Normal)   Pulse 75   Ht 5\' 10"  (1.778 m)   Wt 165 lb 6.4 oz (75 kg)   SpO2 93%   BMI 23.73 kg/m    Review of Systems Denies fever    Objective:   Physical Exam VITAL SIGNS:  See vs page GENERAL: no distress NECK: thyroid is slightly and diffusely enlarged.    Lab Results  Component Value Date   TSH 0.04 (L) 04/26/2020       Assessment & Plan:  Hyperthyroidism: improved.  reduce the methimazole to 2 pills, just once a day.

## 2020-04-26 NOTE — Telephone Encounter (Signed)
This is best for an eye dr to address.  OK to refer.

## 2020-04-26 NOTE — Telephone Encounter (Signed)
Has the referral been placed

## 2020-04-26 NOTE — Patient Instructions (Addendum)
Blood tests are requested for you today.  We'll let you know about the results.  If ever you have fever while taking methimazole, stop it and call us, even if the reason is obvious, because of the risk of a rare side-effect.   It is best to never miss the medication.  However, if you do miss it, next best is to double up the next time.   Please continue the same metoprolol.   Please come back for a follow-up appointment in 2 months.

## 2020-04-26 NOTE — Telephone Encounter (Signed)
Patient called to advise that she forgot to ask Dr Loanne Drilling about her eyes - she is experiencing under eye "swelling or protrusion" and is concerned about this and has also been experiencing intermittent vision problems  Patients call back number is [phone] (wont be in between 2p-4p on 04/26/20)

## 2020-04-26 NOTE — Telephone Encounter (Signed)
Please Advise

## 2020-04-27 DIAGNOSIS — H5789 Other specified disorders of eye and adnexa: Secondary | ICD-10-CM | POA: Insufficient documentation

## 2020-04-27 NOTE — Telephone Encounter (Signed)
done

## 2020-05-01 ENCOUNTER — Ambulatory Visit (INDEPENDENT_AMBULATORY_CARE_PROVIDER_SITE_OTHER): Payer: Medicare Other | Admitting: Family Medicine

## 2020-05-01 ENCOUNTER — Other Ambulatory Visit: Payer: Self-pay

## 2020-05-01 ENCOUNTER — Encounter: Payer: Self-pay | Admitting: Family Medicine

## 2020-05-01 VITALS — BP 120/74 | HR 68 | Ht 70.0 in | Wt 160.0 lb

## 2020-05-01 DIAGNOSIS — M5412 Radiculopathy, cervical region: Secondary | ICD-10-CM | POA: Diagnosis not present

## 2020-05-01 DIAGNOSIS — J0100 Acute maxillary sinusitis, unspecified: Secondary | ICD-10-CM

## 2020-05-01 DIAGNOSIS — L989 Disorder of the skin and subcutaneous tissue, unspecified: Secondary | ICD-10-CM

## 2020-05-01 MED ORDER — AMOXICILLIN-POT CLAVULANATE 875-125 MG PO TABS: 1.0000 | ORAL_TABLET | Freq: Two times a day (BID) | ORAL | 0 refills | Status: DC

## 2020-05-01 MED ORDER — MUPIROCIN 2 % EX OINT: 1.0000 "application " | TOPICAL_OINTMENT | Freq: Two times a day (BID) | CUTANEOUS | 0 refills | Status: DC

## 2020-05-01 NOTE — Patient Instructions (Signed)
Cervical Radiculopathy  Cervical radiculopathy happens when a nerve in the neck (a cervical nerve) is pinched or bruised. This condition can happen because of an injury to the cervical spine (vertebrae) in the neck, or as part of the normal aging process. Pressure on the cervical nerves can cause pain or numbness that travels from the neck all the way down into the arm and fingers. Usually, this condition gets better with rest. Treatment may be needed if the condition does not improve. What are the causes? This condition may be caused by:  A neck injury.  A bulging (herniated) disk.  Muscle spasms.  Muscle tightness in the neck because of overuse.  Arthritis.  Breakdown or degeneration in the bones and joints of the spine (spondylosis) due to aging.  Bone spurs that may develop near the cervical nerves. What are the signs or symptoms? Symptoms of this condition include:  Pain. The pain may travel from the neck to the arm and hand. The pain can be severe or irritating. It may be worse when you move your neck.  Numbness or tingling in your arm or hand.  Weakness in the affected arm and hand, in severe cases. How is this diagnosed? This condition may be diagnosed based on your symptoms, your medical history, and a physical exam. You may also have tests, including:  X-rays.  A CT scan.  An MRI.  An electromyogram (EMG).  Nerve conduction tests. How is this treated? In many cases, treatment is not needed for this condition. With rest, the condition usually gets better over time. If treatment is needed, options may include:  Wearing a soft neck collar (cervical collar) for short periods of time, as told by your health care provider.  Doing physical therapy to strengthen your neck muscles.  Taking medicines, such as NSAIDs or oral corticosteroids.  Having spinal injections, in severe cases.  Having surgery. This may be needed if other treatments do not help. Different types  of surgery may be done depending on the cause of this condition. Follow these instructions at home: If you have a cervical collar:  Wear it as told by your health care provider. Remove it only as told by your health care provider.  Ask your health care provider if you can remove the collar for cleaning and bathing. If you are allowed to remove the collar for cleaning or bathing: ? Follow instructions from your health care provider about how to remove the collar safely. ? Clean the collar by wiping it with mild soap and water and drying it completely. ? Take out any removable pads in the collar every 1-2 days, and wash them by hand with soap and water. Let them air-dry completely before you put them back in the collar. ? Check your skin under the collar for irritation or sores. If you see any, tell your health care provider. Managing pain  Take over-the-counter and prescription medicines only as told by your health care provider.  If directed, put ice on the affected area. ? If you have a soft neck collar, remove it as told by your health care provider. ? Put ice in a plastic bag. ? Place a towel between your skin and the bag. ? Leave the ice on for 20 minutes, 2-3 times a day.  If applying ice does not help, you can try using heat. Use the heat source that your health care provider recommends, such as a moist heat pack or a heating pad. ? Place a towel   between your skin and the heat source. ? Leave the heat on for 20-30 minutes. ? Remove the heat if your skin turns bright red. This is especially important if you are unable to feel pain, heat, or cold. You may have a greater risk of getting burned.  Try a gentle neck and shoulder massage to help relieve symptoms.      Activity  Rest as needed.  Return to your normal activities as told by your health care provider. Ask your health care provider what activities are safe for you.  Do stretching and strengthening exercises as told by  your health care provider or physical therapist.  Do not lift anything that is heavier than 10 lb (4.5 kg) until your health care provider tells you that it is safe. General instructions  Use a flat pillow when you sleep.  Do not drive while wearing a cervical collar. If you do not have a cervical collar, ask your health care provider if it is safe to drive while your neck heals.  Ask your health care provider if the medicine prescribed to you requires you to avoid driving or using heavy machinery.  Do not use any products that contain nicotine or tobacco, such as cigarettes, e-cigarettes, and chewing tobacco. These can delay healing. If you need help quitting, ask your health care provider.  Keep all follow-up visits as told by your health care provider. This is important. Contact a health care provider if:  Your condition does not improve with treatment. Get help right away if:  Your pain gets much worse and cannot be controlled with medicines.  You have weakness or numbness in your hand, arm, face, or leg.  You have a high fever.  You have a stiff, rigid neck.  You lose control of your bowels or your bladder (have incontinence).  You have trouble with walking, balance, or speaking. Summary  Cervical radiculopathy happens when a nerve in the neck is pinched or bruised.  A nerve can get pinched from a bulging disk, arthritis, muscle spasms, or an injury to the neck.  Symptoms include pain, tingling, or numbness radiating from the neck into the arm or hand. Weakness can also occur in severe cases.  Treatment may include rest, wearing a cervical collar, and physical therapy. Medicines may be prescribed to help with pain. In severe cases, injections or surgery may be needed. This information is not intended to replace advice given to you by your health care provider. Make sure you discuss any questions you have with your health care provider. Document Revised: 12/19/2017  Document Reviewed: 12/19/2017 Elsevier Patient Education  2021 Elsevier Inc.  

## 2020-05-01 NOTE — Progress Notes (Signed)
Established Patient Office Visit  Subjective:  Patient ID: Laurie [name], female    DOB: 06-08-1955  Age: 65 y.o. MRN: [id number]  CC:  Chief Complaint  Patient presents with  . Neck Pain    Started a month ago, has gotten worse. Pt wonders if it could be a slipped disc.     HPI Laurie [name] presents for several issues as follows  Her main concern is over 1 month history of neck pain.  She has sharp pain right side of neck that radiates down the right upper extremity.  She describes this as a "sharp and stabbing "pain frequently.  Worse with neck movement.  She had some right hand and right upper extremity numbness and even some occasional tingling and numbness upper chest area on the right side.  She has been dropping things and is concerned about some weakness.  She has left hand dominant.  She had 2 prior neck surgeries with interbody and anterior fusion at C3-4 and C5-6.  She had MRI cervical spine 08/30/2019 which showed no acute findings.  The symptoms are new and started about a month ago.  Pain has been severe at times.  She is tried over-the-counter nonsteroidals with minimal relief.  Other issues she has crusted scabbed area left parietal scalp.  Denies any injury.  Noted several days ago.  She has some soreness in this region.  No fevers or chills.  Complains of acute sinusitis symptoms.  She has had about 2 to 3 weeks of some yellowish nasal discharge and facial pain over her maxillary region predominantly.  No fever.  No bloody nasal discharge.  Past Medical History:  Diagnosis Date  . Allergy   . Anxiety   . Arthritis    RA  . Asthma   . Chronic back pain   . COPD (chronic obstructive pulmonary disease) ([place])   . Depression   . Esophageal stricture   . GERD (gastroesophageal reflux disease)   . Heart murmur   . Hematemesis 09/10/2018  . Hyperlipidemia   . Hypertension   . IBS (irritable bowel syndrome)   . Interstitial cystitis   . Neuromuscular disorder  ([place])    fibromyalgia    Past Surgical History:  Procedure Laterality Date  . ABDOMINAL HYSTERECTOMY    . APPENDECTOMY    . BIOPSY  09/11/2018   Procedure: BIOPSY;  Surgeon: Rush Landmark Telford Nab., MD;  Location: [facility];  Service: Gastroenterology;;  . CERVICAL FUSION    . CERVICAL LAMINECTOMY    . CORONARY STENT INTERVENTION N/A 10/07/2019   Procedure: CORONARY STENT INTERVENTION;  Surgeon: Burnell Blanks, MD;  Location: [facility];  Service: Cardiovascular;  Laterality: N/A;  . ESOPHAGOGASTRODUODENOSCOPY    . ESOPHAGOGASTRODUODENOSCOPY (EGD) WITH PROPOFOL N/A 09/11/2018   Procedure: ESOPHAGOGASTRODUODENOSCOPY (EGD) WITH PROPOFOL;  Surgeon: Rush Landmark Telford Nab., MD;  Location: [facility];  Service: Gastroenterology;  Laterality: N/A;  . FINGER SURGERY    . LEFT HEART CATH AND CORONARY ANGIOGRAPHY N/A 10/07/2019   Procedure: LEFT HEART CATH AND CORONARY ANGIOGRAPHY;  Surgeon: Burnell Blanks, MD;  Location: [facility];  Service: Cardiovascular;  Laterality: N/A;  . LUMBAR FUSION    . SAVORY DILATION N/A 09/11/2018   Procedure: SAVORY DILATION;  Surgeon: Rush Landmark Telford Nab., MD;  Location: [facility];  Service: Gastroenterology;  Laterality: N/A;  . TONSILLECTOMY      Family History  Problem Relation Age of Onset  . Dementia Mother   . Heart  attack Father   . Coronary artery disease Father   . Cancer Brother        esophageal  . Esophageal cancer Brother   . Coronary artery disease Paternal Aunt   . Stomach cancer Paternal Aunt   . Coronary artery disease Paternal Grandmother   . Aneurysm Brother        aortic  . Rectal cancer Neg Hx   . Colon cancer Neg Hx   . Thyroid disease Neg Hx     Social History   Socioeconomic History  . Marital status: Single    Spouse name: Not on file  . Number of children: Not on file  . Years of education: Not on file  . Highest education level: Not on file  Occupational History  .  Occupation: disability  Tobacco Use  . Smoking status: Current Every Day Smoker    Packs/day: 0.50    Types: Cigarettes  . Smokeless tobacco: Never Used  . Tobacco comment: trying to quit   Vaping Use  . Vaping Use: Never used  Substance and Sexual Activity  . Alcohol use: No    Alcohol/week: 0.0 standard drinks  . Drug use: No  . Sexual activity: Not on file  Other Topics Concern  . Not on file  Social History Narrative  . Not on file   Social Determinants of Health   Financial Resource Strain: Low Risk   . Difficulty of Paying Living Expenses: Not very hard  Food Insecurity: Not on file  Transportation Needs: No Transportation Needs  . Lack of Transportation (Medical): No  . Lack of Transportation (Non-Medical): No  Physical Activity: Not on file  Stress: Not on file  Social Connections: Not on file  Intimate Partner Violence: Not on file    Outpatient Medications Prior to Visit  Medication Sig Dispense Refill  . albuterol (VENTOLIN HFA) 108 (90 Base) MCG/ACT inhaler Inhale 2 puffs into the lungs every 6 (six) hours as needed for wheezing or shortness of breath. 18 g 1  . aspirin EC 81 MG tablet Take 1 tablet (81 mg total) by mouth daily. Swallow whole. 90 tablet 3  . atorvastatin (LIPITOR) 40 MG tablet Take 1 tablet (40 mg total) by mouth daily. 90 tablet 3  . Blood Pressure Monitoring DEVI Use as needed DX I10 1 each 1  . clopidogrel (PLAVIX) 75 MG tablet Take 4 tablets the first day then one tablet by mouth daily 90 tablet 3  . fluticasone (FLONASE) 50 MCG/ACT nasal spray Place 2 sprays into both nostrils daily. 16 g 6  . guaiFENesin (MUCINEX) 600 MG 12 hr tablet Take 600 mg by mouth 2 (two) times daily as needed.    Marland Kitchen losartan (COZAAR) 50 MG tablet Take 1 tablet (50 mg total) by mouth daily. 90 tablet 3  . Menthol, Topical Analgesic, (BIOFREEZE EX) Apply 1 application topically daily as needed (back pain).    . methimazole (TAPAZOLE) 10 MG tablet Take 2 tablets (20  mg total) by mouth daily. 180 tablet 1  . metoprolol succinate (TOPROL-XL) 50 MG 24 hr tablet Take 1 tablet (50 mg total) by mouth daily. Take with or immediately following a meal. 90 tablet 3  . pantoprazole (PROTONIX) 40 MG tablet Take 1 tablet (40 mg total) by mouth daily. 90 tablet 0  . venlafaxine XR (EFFEXOR-XR) 150 MG 24 hr capsule TAKE ONE CAPSULE BY MOUTH EVERY MORNING WITH BREAKFAST 30 capsule 1  . nitroGLYCERIN (NITROSTAT) 0.4 MG SL tablet Place 1  tablet (0.4 mg total) under the tongue every 5 (five) minutes as needed for chest pain. 25 tablet 1   No facility-administered medications prior to visit.    Allergies  Allergen Reactions  . Codeine Nausea And Vomiting  . Lisinopril Cough  . Sulfonamide Derivatives Nausea And Vomiting  . Tetracyclines & Related Rash    ROS Review of Systems  Constitutional: Negative for chills and fever.  Respiratory: Negative for cough and shortness of breath.   Cardiovascular: Negative for chest pain.  Genitourinary: Negative for dysuria.  Musculoskeletal: Positive for neck pain.      Objective:    Physical Exam Vitals reviewed.  Constitutional:      Appearance: Normal appearance.  Neck:     Comments: She has very limited range of the neck.  Very limited lateral bending to either side.  She can only rotate her head about 45 degrees to the right and 40 degrees to the left.  Limited neck extension and flexion secondary to pain Cardiovascular:     Rate and Rhythm: Normal rate and regular rhythm.  Pulmonary:     Effort: Pulmonary effort is normal.     Breath sounds: Normal breath sounds.  Skin:    Comments: Scalp exam reveals approximately half centimeter area of erythema and mild crusting of the skin.  No nodules and no growth.  No necrosis.  Neurological:     Mental Status: She is alert.     Comments: She has good grip strength bilaterally.  Seems to have some general weakness in both upper extremities though other than grip strength.   Deep tendon reflexes are 2+ upper extremities and symmetric     BP 120/74   Pulse 68   Ht 5\' 10"  (1.778 m)   Wt 160 lb (72.6 kg)   SpO2 98%   BMI 22.96 kg/m  Wt Readings from Last 3 Encounters:  05/01/20 160 lb (72.6 kg)  04/26/20 165 lb 6.4 oz (75 kg)  03/13/20 156 lb 3.2 oz (70.9 kg)     Health Maintenance Due  Topic Date Due  . MAMMOGRAM  10/26/2011  . COLONOSCOPY (Pts 45-27yrs Insurance coverage will need to be confirmed)  09/11/2018  . COVID-19 Vaccine (2 - Booster for [employer] series) 08/07/2019  . INFLUENZA VACCINE  09/12/2019  . TETANUS/TDAP  10/26/2019  . DEXA SCAN  Never done  . PNA vac Low Risk Adult (1 of 2 - PCV13) Never done    There are no preventive care reminders to display for this patient.  Lab Results  Component Value Date   TSH 0.04 (L) 04/26/2020   Lab Results  Component Value Date   WBC 6.9 02/23/2020   HGB 11.9 (L) 02/23/2020   HCT 35.3 (L) 02/23/2020   MCV 81.2 02/23/2020   PLT 206.0 02/23/2020   Lab Results  Component Value Date   NA 136 02/23/2020   K 4.2 02/23/2020   CO2 27 02/23/2020   GLUCOSE 122 (H) 02/23/2020   BUN 20 02/23/2020   CREATININE 0.88 02/23/2020   BILITOT 0.3 02/23/2020   ALKPHOS 77 02/23/2020   AST 12 02/23/2020   ALT 14 02/23/2020   PROT 6.7 02/23/2020   ALBUMIN 3.7 02/23/2020   CALCIUM 9.3 02/23/2020   ANIONGAP 9 08/30/2019   GFR 69.04 02/23/2020   Lab Results  Component Value Date   CHOL 159 09/03/2019   Lab Results  Component Value Date   HDL 46 (L) 09/03/2019   Lab Results  Component Value Date  [place] 81 09/03/2019   Lab Results  Component Value Date   TRIG 227 (H) 09/03/2019   Lab Results  Component Value Date   CHOLHDL 3.5 09/03/2019   Lab Results  Component Value Date   HGBA1C 5.4 09/13/2018      Assessment & Plan:   #1 right cervical radiculitis symptoms.  She is describing progressive upper extremity weakness and severe pain at times along with upper extremity numbness.   She has had prior cervical spine surgery x2 -Given multiweek history of symptoms above and prior surgery along with neurologic concerns for weakness and numbness obtain follow-up cervical MRI -She requested pain medications.  She does have history of some drug abuse and we recommended avoidance of opioids as much as possible.  She will try over-the-counter Aleve for now  #2 acute sinusitis symptoms for several weeks -Start Augmentin 875 mg by mouth twice daily with food  #3 crusted lesion left parietal scalp.  Etiology unclear.  Denies injury. -Recommend Bactroban ointment to use to topical lesion 2-3 times daily.  Be in touch if this is not fully clearing in 2 to 3 weeks  Meds ordered this encounter  Medications  . amoxicillin-clavulanate (AUGMENTIN) 875-125 MG tablet    Sig: Take 1 tablet by mouth 2 (two) times daily.    Dispense:  20 tablet    Refill:  0    Follow-up: No follow-ups on file.    [name], MD

## 2020-05-04 ENCOUNTER — Telehealth: Payer: Self-pay | Admitting: Pharmacist

## 2020-05-04 NOTE — Chronic Care Management (AMB) (Addendum)
Chronic Care Management Pharmacy Assistant   Name: Laurie [name]  MRN: [id number] DOB: 21-Oct-1955  Reason for Encounter: Medication Review   Recent office visits:  03.16.2022 Renato Shin, MD Endocrinology Methimazole 10 mg: Two (2) tablets Daily(Decreased )  Recent consult visits:  03.21.2022 [name], [facility] visits:  None in previous 6 months  Medications: Outpatient Encounter Medications as of 05/04/2020  Medication Sig   albuterol (VENTOLIN HFA) 108 (90 Base) MCG/ACT inhaler Inhale 2 puffs into the lungs every 6 (six) hours as needed for wheezing or shortness of breath.   amoxicillin-clavulanate (AUGMENTIN) 875-125 MG tablet Take 1 tablet by mouth 2 (two) times daily.   aspirin EC 81 MG tablet Take 1 tablet (81 mg total) by mouth daily. Swallow whole.   atorvastatin (LIPITOR) 40 MG tablet Take 1 tablet (40 mg total) by mouth daily.   Blood Pressure Monitoring DEVI Use as needed DX I10   clopidogrel (PLAVIX) 75 MG tablet Take 4 tablets the first day then one tablet by mouth daily   fluticasone (FLONASE) 50 MCG/ACT nasal spray Place 2 sprays into both nostrils daily.   guaiFENesin (MUCINEX) 600 MG 12 hr tablet Take 600 mg by mouth 2 (two) times daily as needed.   losartan (COZAAR) 50 MG tablet Take 1 tablet (50 mg total) by mouth daily.   Menthol, Topical Analgesic, (BIOFREEZE EX) Apply 1 application topically daily as needed (back pain).   methimazole (TAPAZOLE) 10 MG tablet Take 2 tablets (20 mg total) by mouth daily.   metoprolol succinate (TOPROL-XL) 50 MG 24 hr tablet Take 1 tablet (50 mg total) by mouth daily. Take with or immediately following a meal.   mupirocin ointment (BACTROBAN) 2 % Apply 1 application topically 2 (two) times daily.   nitroGLYCERIN (NITROSTAT) 0.4 MG SL tablet Place 1 tablet (0.4 mg total) under the tongue every 5 (five) minutes as needed for chest pain.   pantoprazole (PROTONIX) 40 MG tablet Take 1 tablet (40 mg  total) by mouth daily.   venlafaxine XR (EFFEXOR-XR) 150 MG 24 hr capsule TAKE ONE CAPSULE BY MOUTH EVERY MORNING WITH BREAKFAST   No facility-administered encounter medications on file as of 05/04/2020.   Reviewed chart for medication changes ahead of medication coordination call.  BP Readings from Last 3 Encounters:  05/01/20 120/74  04/26/20 (!) 150/70  03/13/20 (!) 176/60    Lab Results  Component Value Date   HGBA1C 5.4 09/13/2018    Patient obtains medications through Adherence Packaging  30 Days  Last adherence delivery included:  Losartan (COZAAR) 50 mg: one tablet with breakfast Venlafaxine XR (EFFEXOR-XR) 150 mg: one capsule by mouth with breakfast Pantoprazole (PROTONIX) 40 mg: one tablet at breakfast Atorvastatin (LIPITOR) 40 mg: one tablet at bedtime Methimazole (TAPAZOLE) 10 mg: Two (2) tablets at breakfast and two (2) at bedtime Metoprolol succinate (TOPROL-XL) 25 MG 24 hr: one tablet at breakfast Albuterol (VENTOLIN HFA) 108 (90 Base) MCG/ACT: Inhale 2 puffs every 6 hours as needed Fluticasone (FLONASE) 50 MCG/ACT nasal spray: Place 2 sprays into both nostrils daily Aspirin EC 81 mg: one tablet at bedtime Clopidogrel (PLAVIX) 75 mg: four (4) tablets the first day then one tablet by mouth daily  Patient declined the following medication last month due to PRN use/additional supply on hand. Nitroglycerin (NITROSTAT) 0.4 MG SL: Place 1 tablet (0.4 mg total) under the tongue every 5 (five) minutes as needed for chest pain   Patient is due for next adherence  delivery on: 05/09/2020. Called patient and reviewed medications and coordinated delivery. This delivery to include: Losartan (COZAAR) 50 mg: one tablet with breakfast Venlafaxine XR (EFFEXOR-XR) 150 mg: one capsule by mouth with breakfast Pantoprazole (PROTONIX) 40 mg: one tablet at breakfast Atorvastatin (LIPITOR) 40 mg: one tablet at dinner Methimazole (TAPAZOLE) 10 mg: two tablets at dinner Metoprolol  succinate (TOPROL-XL) 25 MG 24 hr: one tablet at breakfast Albuterol (VENTOLIN HFA) 108 (90 Base) MCG/ACT: Inhale 2 puffs every 6 hours as needed Fluticasone (FLONASE) 50 MCG/ACT nasal spray: Place 2 sprays into both nostrils daily Clopidogrel (PLAVIX) 75 mg: four (4) tablets the first day then one tablet by mouth daily Aspirin EC 81 mg: one tablet at dinner  Patient declined the following medications due to PRN use.: Nitroglycerin (NITROSTAT) 0.4 MG SL: Place 1 tablet (0.4 mg total) under the tongue every 5 (five) minutes as needed for chest pain   She currently does not refills.  Confirmed delivery date of 05/09/2020 , advised patient that pharmacy will contact them the morning of delivery.  Star Rating Drugs:  Dispensed Quantity Pharmacy  Atorvastatin 40 mg 02.28.2022 [facility]  Losartan 50 gm 02.28.2022 [facility]    Maia Breslow, [place] Assistant [phone]

## 2020-05-09 ENCOUNTER — Telehealth: Payer: Self-pay | Admitting: Family Medicine

## 2020-05-09 DIAGNOSIS — G44009 Cluster headache syndrome, unspecified, not intractable: Secondary | ICD-10-CM

## 2020-05-09 DIAGNOSIS — R519 Headache, unspecified: Secondary | ICD-10-CM

## 2020-05-09 NOTE — Telephone Encounter (Signed)
The patient is scheduled to have a MRI on her neck 05/16/2020 and she was wondering if she can have her ears and sinuses scanned also.   She is having right ear pain that makes her want to scream.  She also had a COVID test done and they were not able to get the swab up the left side of her nose, they tried 3 times so they had to swab the right side. She is wondering if she may have a tumor or something in her sinuses.  Please advise

## 2020-05-10 NOTE — Telephone Encounter (Signed)
If main concern is sinuses, limited CT maxillofacial would include her sinuses and that would be much more cost effective than MRI of the brain.

## 2020-05-10 NOTE — Telephone Encounter (Signed)
ATC, unable to leave a voice mail.  

## 2020-05-10 NOTE — Telephone Encounter (Signed)
Pt is returning the call to the office 

## 2020-05-12 NOTE — Telephone Encounter (Signed)
ATC, unable to leave a voice mail.  

## 2020-05-15 NOTE — Telephone Encounter (Signed)
Spoke with the patient, she agrees with Dr. Anastasio Auerbach recommendations and would like to have to order placed for the CT

## 2020-05-15 NOTE — Telephone Encounter (Signed)
Limited maxillofacial CT ordered.

## 2020-05-15 NOTE — Addendum Note (Signed)
Addended by: [name] on: 05/15/2020 08:51 PM   Modules accepted: Orders

## 2020-05-16 ENCOUNTER — Other Ambulatory Visit: Payer: Self-pay

## 2020-05-16 ENCOUNTER — Ambulatory Visit
Admission: RE | Admit: 2020-05-16 | Discharge: 2020-05-16 | Disposition: A | Discharge status: Home / Self Care | Payer: Medicare Other | Source: Ambulatory Visit | Attending: Family Medicine | Admitting: Family Medicine

## 2020-05-16 DIAGNOSIS — M5412 Radiculopathy, cervical region: Secondary | ICD-10-CM

## 2020-05-16 DIAGNOSIS — M2578 Osteophyte, vertebrae: Secondary | ICD-10-CM | POA: Diagnosis not present

## 2020-05-16 DIAGNOSIS — M4319 Spondylolisthesis, multiple sites in spine: Secondary | ICD-10-CM | POA: Diagnosis not present

## 2020-05-16 DIAGNOSIS — Z981 Arthrodesis status: Secondary | ICD-10-CM | POA: Diagnosis not present

## 2020-05-16 DIAGNOSIS — M4722 Other spondylosis with radiculopathy, cervical region: Secondary | ICD-10-CM | POA: Diagnosis not present

## 2020-05-16 NOTE — Telephone Encounter (Signed)
The patient has been made aware. Nothing further needed.

## 2020-05-17 NOTE — Telephone Encounter (Signed)
The patient called wanting to let Dr. Elease Hashimoto know that the pain is at the base of her skull to the right under her ear. He Right hand and arm tingle and go numb. She just want to let Dr. Elease Hashimoto know how much pain she is in and she was wanting to know if the CT scan can also check her ear while checking her sinuses.  She was also wanting to know if Dr. Elease Hashimoto can call her in something for pain.  She thinks it's her carotid artery and sometimes she's not able to get up on her own.  Please advise

## 2020-05-18 ENCOUNTER — Telehealth: Payer: Self-pay | Admitting: Family Medicine

## 2020-05-18 NOTE — Telephone Encounter (Signed)
CT has been canceled. New order for head CT has been placed.  Left message for patient to call back.

## 2020-05-18 NOTE — Telephone Encounter (Signed)
1)   cancel CT maxillofacial 2) order CT head without contrast.   Dx atypical headache 3) carotid artery obstruction would not likely cause pain.  We can discuss possible carotid doppler at follow up. 4)  I do not feel comfortable calling in pain medication until we further assess- and she has hx of drug use and want to be very cautious with opioids.

## 2020-05-18 NOTE — Addendum Note (Signed)
Addended by: Rebecca Eaton on: 05/18/2020 11:42 AM   Modules accepted: Orders

## 2020-05-18 NOTE — Addendum Note (Signed)
Addended by: Rebecca Eaton on: 05/18/2020 11:47 AM   Modules accepted: Orders

## 2020-05-18 NOTE — Telephone Encounter (Signed)
Pt call and stated she is returning your call and want a call back. 

## 2020-05-19 NOTE — Telephone Encounter (Signed)
Spoke with the patient. She is aware of Dr. Anastasio Auerbach notes below and states that imaging has already called her to schedule the CT head. Nothing further needed.

## 2020-05-19 NOTE — Telephone Encounter (Signed)
See phone note from 05/09/20

## 2020-05-22 NOTE — Telephone Encounter (Signed)
FYI

## 2020-05-22 NOTE — Telephone Encounter (Signed)
[facility] called regarding the pt's referral. They state they do not take patients with eye swelling and that this is something a general optimaligist handles. The Drs they recommend for this referral are Dr. Herbert Deaner or Dr. Katy Fitch

## 2020-05-25 NOTE — Telephone Encounter (Signed)
Dr Loanne Drilling, did you want to place a new referral to Dr Herbert Deaner or Katy Fitch? Please advise.

## 2020-05-26 ENCOUNTER — Other Ambulatory Visit: Payer: Self-pay | Admitting: Family Medicine

## 2020-05-28 NOTE — Telephone Encounter (Signed)
Looks like she is needing referral to ophthalmology.  OK to set up with Dr Herbert Deaner or Katy Fitch.

## 2020-05-29 ENCOUNTER — Other Ambulatory Visit: Payer: Medicare Other

## 2020-05-29 NOTE — Telephone Encounter (Signed)
Referral has been placed. Patient has been informed. Nothing further needed.

## 2020-05-29 NOTE — Addendum Note (Signed)
Addended by: Rebecca Eaton on: 05/29/2020 01:56 PM   Modules accepted: Orders

## 2020-06-02 ENCOUNTER — Ambulatory Visit
Admission: RE | Admit: 2020-06-02 | Discharge: 2020-06-02 | Disposition: A | Discharge status: Home / Self Care | Payer: Medicare Other | Source: Ambulatory Visit | Attending: Family Medicine | Admitting: Family Medicine

## 2020-06-02 ENCOUNTER — Other Ambulatory Visit: Payer: Self-pay

## 2020-06-02 ENCOUNTER — Telehealth: Payer: Self-pay | Admitting: Pharmacist

## 2020-06-02 DIAGNOSIS — R519 Headache, unspecified: Secondary | ICD-10-CM | POA: Diagnosis not present

## 2020-06-02 DIAGNOSIS — G44009 Cluster headache syndrome, unspecified, not intractable: Secondary | ICD-10-CM

## 2020-06-02 NOTE — Chronic Care Management (AMB) (Signed)
Chronic Care Management Pharmacy Assistant   Name: Laurie [name]  MRN: [id number] DOB: 04-07-1955   Reason for Encounter: Medication Review/ Medication Coordination Call   Recent office visits:  None  Recent consult visits:  None  Hospital visits:  None in previous 6 months  Medications: Outpatient Encounter Medications as of 06/02/2020  Medication Sig  . albuterol (VENTOLIN HFA) 108 (90 Base) MCG/ACT inhaler Inhale 2 puffs into the lungs every 6 (six) hours as needed for wheezing or shortness of breath.  Marland Kitchen amoxicillin-clavulanate (AUGMENTIN) 875-125 MG tablet Take 1 tablet by mouth 2 (two) times daily.  Marland Kitchen aspirin EC 81 MG tablet Take 1 tablet (81 mg total) by mouth daily. Swallow whole.  Marland Kitchen atorvastatin (LIPITOR) 40 MG tablet Take 1 tablet (40 mg total) by mouth daily.  . Blood Pressure Monitoring DEVI Use as needed DX I10  . clopidogrel (PLAVIX) 75 MG tablet Take 4 tablets the first day then one tablet by mouth daily  . fluticasone (FLONASE) 50 MCG/ACT nasal spray Place 2 sprays into both nostrils daily.  Marland Kitchen guaiFENesin (MUCINEX) 600 MG 12 hr tablet Take 600 mg by mouth 2 (two) times daily as needed.  Marland Kitchen losartan (COZAAR) 50 MG tablet Take 1 tablet (50 mg total) by mouth daily.  . Menthol, Topical Analgesic, (BIOFREEZE EX) Apply 1 application topically daily as needed (back pain).  . methimazole (TAPAZOLE) 10 MG tablet Take 2 tablets (20 mg total) by mouth daily.  . metoprolol succinate (TOPROL-XL) 50 MG 24 hr tablet Take 1 tablet (50 mg total) by mouth daily. Take with or immediately following a meal.  . mupirocin ointment (BACTROBAN) 2 % Apply 1 application topically 2 (two) times daily.  . nitroGLYCERIN (NITROSTAT) 0.4 MG SL tablet Place 1 tablet (0.4 mg total) under the tongue every 5 (five) minutes as needed for chest pain.  . pantoprazole (PROTONIX) 40 MG tablet Take 1 tablet (40 mg total) by mouth daily.  Marland Kitchen venlafaxine XR (EFFEXOR-XR) 150 MG 24 hr capsule TAKE ONE  CAPSULE BY MOUTH EVERY MORNING WITH BREAKFAST   No facility-administered encounter medications on file as of 06/02/2020.   Reviewed chart for medication changes ahead of medication coordination call. No OVs, Consults, or hospital visits since last care coordination call/Pharmacist visit.  No medication changes indicated  BP Readings from Last 3 Encounters:  05/01/20 120/74  04/26/20 (!) 150/70  03/13/20 (!) 176/60    Lab Results  Component Value Date   HGBA1C 5.4 09/13/2018     Patient obtains medications through Adherence Packaging  30 Days  Last adherence delivery included: . Losartan (COZAAR) 50 mg: one tablet with breakfast . Venlafaxine XR (EFFEXOR-XR) 150 mg: one capsule by mouth with breakfast . Pantoprazole (PROTONIX) 40 mg: one tablet at breakfast . Atorvastatin (LIPITOR) 40 mg: one tablet at dinner . Methimazole (TAPAZOLE) 10 mg: two tablets at dinner . Metoprolol succinate (TOPROL-XL) 25 MG 24 hr: one tablet at breakfast . Albuterol (VENTOLIN HFA) 108 (90 Base) MCG/ACT: Inhale 2 puffs every 6 hours as needed . Fluticasone (FLONASE) 50 MCG/ACT nasal spray: Place 2 sprays into both nostrils daily . Clopidogrel (PLAVIX) 75 mg: one tablet at breakfast . Aspirin EC 81 mg: one tablet at dinner  Patient declined the following medications last month due to PRN use.  . Nitroglycerin (NITROSTAT) 0.4 MG SL: Place 1 tablet (0.4 mg total) under the tongue every 5 (five) minutes as needed for chest pain   Patient is due for next adherence delivery on:  06/06/2020 Called patient and reviewed medications and coordinated delivery. This delivery to include: . Losartan (COZAAR) 50 mg: one tablet with breakfast . Venlafaxine XR (EFFEXOR-XR) 150 mg: one capsule by mouth with breakfast . Pantoprazole (PROTONIX) 40 mg: one tablet at breakfast . Atorvastatin (LIPITOR) 40 mg: one tablet at dinner . Methimazole (TAPAZOLE) 10 mg: two tablets at dinner . Metoprolol succinate (TOPROL-XL) 25 MG  24 hr: one tablet at breakfast . Albuterol (VENTOLIN HFA) 108 (90 Base) MCG/ACT: Inhale 2 puffs every 6 hours as needed . Fluticasone (FLONASE) 50 MCG/ACT nasal spray: Place 2 sprays into both nostrils daily . Clopidogrel (PLAVIX) 75 mg: one tablet at breakfast . Aspirin EC 81 mg: one tablet at dinner  Patient declined the following medications last month due to PRN use.  . Nitroglycerin (NITROSTAT) 0.4 MG SL: Place 1 tablet (0.4 mg total) under the tongue every 5 (five) minutes as needed for chest pain   The patient currently does not need any refills.  Confirmed delivery date of 06/07/2020 advised patient that pharmacy will contact them the morning of delivery.  Star Rating Drugs:  Dispensed Quantity Pharmacy  Losartan 50 mg 03.26.2022 [facility]  Atorvastatin 40 mg 03.26.2022 [facility]   [name], [place] [phone]

## 2020-06-05 ENCOUNTER — Telehealth: Payer: Self-pay | Admitting: Family Medicine

## 2020-06-05 NOTE — Telephone Encounter (Signed)
Left message for patient to call back  

## 2020-06-05 NOTE — Telephone Encounter (Signed)
Patient is calling and wanted to see if her CT results were back yet, please advise. CB is [phone]

## 2020-06-06 NOTE — Telephone Encounter (Signed)
Discussed results with patient, patient expressed understanding. Follow up has been scheduled. Nothing further needed.

## 2020-06-07 ENCOUNTER — Telehealth (INDEPENDENT_AMBULATORY_CARE_PROVIDER_SITE_OTHER): Payer: Medicare Other | Admitting: Family Medicine

## 2020-06-07 ENCOUNTER — Other Ambulatory Visit: Payer: Self-pay

## 2020-06-07 DIAGNOSIS — J0100 Acute maxillary sinusitis, unspecified: Secondary | ICD-10-CM

## 2020-06-07 MED ORDER — ONDANSETRON 8 MG PO TBDP: 8.0000 mg | ORAL_TABLET | Freq: Three times a day (TID) | ORAL | 0 refills | Status: DC | PRN

## 2020-06-07 MED ORDER — AMOXICILLIN-POT CLAVULANATE 875-125 MG PO TABS: 1.0000 | ORAL_TABLET | Freq: Two times a day (BID) | ORAL | 0 refills | Status: DC

## 2020-06-07 NOTE — Progress Notes (Signed)
Patient ID: Laurie [name], female   DOB: [birth year], 65 y.o.   MRN: [id number]   This visit type was conducted due to national recommendations for restrictions regarding the COVID-19 pandemic in an effort to limit this patient's exposure and mitigate transmission in our community.   Virtual Visit via Telephone Note  I connected with [place] on 06/07/20 at 11:45 AM EDT by telephone and verified that I am speaking with the correct person using two identifiers.   I discussed the limitations, risks, security and privacy concerns of performing an evaluation and management service by telephone and the availability of in person appointments. I also discussed with the patient that there may be a patient responsible charge related to this service. The patient expressed understanding and agreed to proceed.  Location patient: home Location provider: work or home office Participants present for the call: patient, provider Patient did not have a visit in the prior 7 days to address this/these issue(s).   History of Present Illness:  [name] had recent vague pain mostly right occipital area.  She also relates some recent occasional more diffuse headaches and some nasal congestion.  She had thick sometimes discolored mucus from both nares.  Rare blood.  No recent cough.  She had some increased malaise.  She called to review recent CT of head.  This showed no acute abnormality.  No cerebral masses or evidence for bleeds.  She did have comment of partial opacification of the right maxillary sinus.  Given her recent headaches and persistent thickened nasal discharge need to consider treatment.  She has history of allergy to sulfa and tetracycline but has tolerated Augmentin.  She did awake up with some nausea this morning and 1 episode of vomiting.  No diarrhea.  No abdominal pain.   Observations/Objective: Patient sounds cheerful and well on the phone. I do not appreciate any SOB. Speech and  thought processing are grossly intact. Patient reported vitals:  Assessment and Plan:  Probable acute versus chronic sinusitis right maxillary  -Given imaging above go ahead and start another course of Augmentin 875 mg twice daily for 10 days -Wrote for limited Zofran 8 mg ODT 1 every 8 hours as needed for nausea and vomiting -Touch base if headache and sinus symptoms not improving with the above  Follow Up Instructions:  -As above   99441 5-10 99442 11-20 99443 21-30 I did not refer this patient for an OV in the next 24 hours for this/these issue(s).  I discussed the assessment and treatment plan with the patient. The patient was provided an opportunity to ask questions and all were answered. The patient agreed with the plan and demonstrated an understanding of the instructions.   The patient was advised to call back or seek an in-person evaluation if the symptoms worsen or if the condition fails to improve as anticipated.  I provided 13 minutes of non-face-to-face time during this encounter.   [name], MD

## 2020-06-16 ENCOUNTER — Other Ambulatory Visit: Payer: Self-pay

## 2020-06-16 ENCOUNTER — Telehealth: Payer: Self-pay | Admitting: Family Medicine

## 2020-06-16 ENCOUNTER — Ambulatory Visit: Payer: Medicare Other | Admitting: Family Medicine

## 2020-06-16 NOTE — Telephone Encounter (Signed)
Pt call and want a call back about the pain under her right ear she stated she have seen dr.Burchette about it.

## 2020-06-16 NOTE — Telephone Encounter (Signed)
Spoke with the patient. She stated the pain behind her ear is getting worse. She also stated that she is having numbness in her hands. She stated it feels like her hand is falling asleep and it is causing her to drop things. Patient has been scheduled for an in office appointment this afternoon. Will send to Dr. Elease Hashimoto as Juluis Rainier.

## 2020-06-16 NOTE — Telephone Encounter (Signed)
Left message for patient to call back  

## 2020-06-19 ENCOUNTER — Ambulatory Visit: Payer: Medicare Other | Admitting: Family Medicine

## 2020-06-22 ENCOUNTER — Ambulatory Visit: Payer: Medicare Other

## 2020-06-22 NOTE — Progress Notes (Deleted)
Subjective:   Laurie [name] is a 65 y.o. female who presents for an Initial Medicare Annual Wellness Visit.  Review of Systems    ***       Objective:    There were no vitals filed for this visit. There is no height or weight on file to calculate BMI.  Advanced Directives 10/07/2019 08/29/2019 09/11/2018 09/10/2018 09/09/2018 11/05/2017 04/30/2014  Does Patient Have a Medical Advance Directive? No No No No No No No  Would patient like information on creating a medical advance directive? No - Patient declined - No - Patient declined No - Patient declined - No - Patient declined No - patient declined information    Current Medications (verified) Outpatient Encounter Medications as of 06/22/2020  Medication Sig  . albuterol (VENTOLIN HFA) 108 (90 Base) MCG/ACT inhaler Inhale 2 puffs into the lungs every 6 (six) hours as needed for wheezing or shortness of breath.  Marland Kitchen amoxicillin-clavulanate (AUGMENTIN) 875-125 MG tablet Take 1 tablet by mouth 2 (two) times daily.  Marland Kitchen aspirin EC 81 MG tablet Take 1 tablet (81 mg total) by mouth daily. Swallow whole.  Marland Kitchen atorvastatin (LIPITOR) 40 MG tablet Take 1 tablet (40 mg total) by mouth daily.  . Blood Pressure Monitoring DEVI Use as needed DX I10  . clopidogrel (PLAVIX) 75 MG tablet Take 4 tablets the first day then one tablet by mouth daily  . fluticasone (FLONASE) 50 MCG/ACT nasal spray Place 2 sprays into both nostrils daily.  Marland Kitchen guaiFENesin (MUCINEX) 600 MG 12 hr tablet Take 600 mg by mouth 2 (two) times daily as needed.  Marland Kitchen losartan (COZAAR) 50 MG tablet Take 1 tablet (50 mg total) by mouth daily.  . Menthol, Topical Analgesic, (BIOFREEZE EX) Apply 1 application topically daily as needed (back pain).  . methimazole (TAPAZOLE) 10 MG tablet Take 2 tablets (20 mg total) by mouth daily.  . metoprolol succinate (TOPROL-XL) 50 MG 24 hr tablet Take 1 tablet (50 mg total) by mouth daily. Take with or immediately following a meal.  . mupirocin ointment  (BACTROBAN) 2 % Apply 1 application topically 2 (two) times daily.  . nitroGLYCERIN (NITROSTAT) 0.4 MG SL tablet Place 1 tablet (0.4 mg total) under the tongue every 5 (five) minutes as needed for chest pain.  Marland Kitchen ondansetron (ZOFRAN ODT) 8 MG disintegrating tablet Take 1 tablet (8 mg total) by mouth every 8 (eight) hours as needed for nausea or vomiting.  . pantoprazole (PROTONIX) 40 MG tablet Take 1 tablet (40 mg total) by mouth daily.  Marland Kitchen venlafaxine XR (EFFEXOR-XR) 150 MG 24 hr capsule TAKE ONE CAPSULE BY MOUTH EVERY MORNING WITH BREAKFAST   No facility-administered encounter medications on file as of 06/22/2020.    Allergies (verified) Codeine, Lisinopril, Sulfonamide derivatives, and Tetracyclines & related   History: Past Medical History:  Diagnosis Date  . Allergy   . Anxiety   . Arthritis    RA  . Asthma   . Chronic back pain   . COPD (chronic obstructive pulmonary disease) ([place])   . Depression   . Esophageal stricture   . GERD (gastroesophageal reflux disease)   . Heart murmur   . Hematemesis 09/10/2018  . Hyperlipidemia   . Hypertension   . IBS (irritable bowel syndrome)   . Interstitial cystitis   . Neuromuscular disorder ([place])    fibromyalgia   Past Surgical History:  Procedure Laterality Date  . ABDOMINAL HYSTERECTOMY    . APPENDECTOMY    . BIOPSY  09/11/2018  Procedure: BIOPSY;  Surgeon: Irving Copas., MD;  Location: [facility];  Service: Gastroenterology;;  . CERVICAL FUSION    . CERVICAL LAMINECTOMY    . CORONARY STENT INTERVENTION N/A 10/07/2019   Procedure: CORONARY STENT INTERVENTION;  Surgeon: Burnell Blanks, MD;  Location: [facility];  Service: Cardiovascular;  Laterality: N/A;  . ESOPHAGOGASTRODUODENOSCOPY    . ESOPHAGOGASTRODUODENOSCOPY (EGD) WITH PROPOFOL N/A 09/11/2018   Procedure: ESOPHAGOGASTRODUODENOSCOPY (EGD) WITH PROPOFOL;  Surgeon: Rush Landmark Telford Nab., MD;  Location: [facility];  Service: Gastroenterology;   Laterality: N/A;  . FINGER SURGERY    . LEFT HEART CATH AND CORONARY ANGIOGRAPHY N/A 10/07/2019   Procedure: LEFT HEART CATH AND CORONARY ANGIOGRAPHY;  Surgeon: Burnell Blanks, MD;  Location: [facility];  Service: Cardiovascular;  Laterality: N/A;  . LUMBAR FUSION    . SAVORY DILATION N/A 09/11/2018   Procedure: SAVORY DILATION;  Surgeon: Rush Landmark Telford Nab., MD;  Location: [facility];  Service: Gastroenterology;  Laterality: N/A;  . TONSILLECTOMY     Family History  Problem Relation Age of Onset  . Dementia Mother   . Heart attack Father   . Coronary artery disease Father   . Cancer Brother        esophageal  . Esophageal cancer Brother   . Coronary artery disease Paternal Aunt   . Stomach cancer Paternal Aunt   . Coronary artery disease Paternal Grandmother   . Aneurysm Brother        aortic  . Rectal cancer Neg Hx   . Colon cancer Neg Hx   . Thyroid disease Neg Hx    Social History   Socioeconomic History  . Marital status: Single    Spouse name: Not on file  . Number of children: Not on file  . Years of education: Not on file  . Highest education level: Not on file  Occupational History  . Occupation: disability  Tobacco Use  . Smoking status: Current Every Day Smoker    Packs/day: 0.50    Types: Cigarettes  . Smokeless tobacco: Never Used  . Tobacco comment: trying to quit   Vaping Use  . Vaping Use: Never used  Substance and Sexual Activity  . Alcohol use: No    Alcohol/week: 0.0 standard drinks  . Drug use: No  . Sexual activity: Not on file  Other Topics Concern  . Not on file  Social History Narrative  . Not on file   Social Determinants of Health   Financial Resource Strain: Low Risk   . Difficulty of Paying Living Expenses: Not very hard  Food Insecurity: Not on file  Transportation Needs: No Transportation Needs  . Lack of Transportation (Medical): No  . Lack of Transportation (Non-Medical): No  Physical Activity: Not on  file  Stress: Not on file  Social Connections: Not on file    Tobacco Counseling Ready to quit: Not Answered Counseling given: Not Answered Comment: trying to quit    Clinical Intake:                 Diabetic?***         Activities of Daily Living No flowsheet data found.  Patient Care Team: [name], MD as PCP - General (Family Medicine) Burnell Blanks, MD as PCP - Cardiology (Cardiology) Viona Gilmore, [facility] as Pharmacist (Pharmacist)  Indicate any recent Medical Services you may have received from other than [facility] providers in the past year (date may be approximate).  Assessment:   This is a routine wellness examination for [name].  Hearing/Vision screen No exam data present  Dietary issues and exercise activities discussed:    Goals Addressed   None    Depression Screen PHQ 2/9 Scores 11/24/2019  PHQ - 2 Score 5  PHQ- 9 Score 19    Fall Risk Fall Risk  08/23/2015  Falls in the past year? No    FALL RISK PREVENTION PERTAINING TO THE HOME:  Any stairs in or around the home? {YES/NO:21197} If so, are there any without handrails? {YES/NO:21197} Home free of loose throw rugs in walkways, pet beds, electrical cords, etc? {YES/NO:21197} Adequate lighting in your home to reduce risk of falls? {YES/NO:21197}  ASSISTIVE DEVICES UTILIZED TO PREVENT FALLS:  Life alert? {YES/NO:21197} Use of a cane, walker or w/c? {YES/NO:21197} Grab bars in the bathroom? {YES/NO:21197} Shower chair or bench in shower? {YES/NO:21197} Elevated toilet seat or a handicapped toilet? {YES/NO:21197}  TIMED UP AND GO:  Was the test performed? {YES/NO:21197}.  Length of time to ambulate 10 feet: *** sec.   {Appearance of PXTG:6269485}  Cognitive Function:        Immunizations Immunization History  Administered Date(s) Administered  . Influenza Split 12/16/2011  . Influenza Whole 11/15/2008, 10/25/2009  . Influenza,inj,Quad PF,6+  Mos 11/18/2012, 11/29/2013, 11/08/2014, 03/14/2017  . Janssen (J&J) SARS-COV-2 Vaccination 06/12/2019  . Td 10/25/2009    {TDAP status:2101805}  {Flu Vaccine status:2101806}  {Pneumococcal vaccine status:2101807}  {Covid-19 vaccine status:2101808}  Qualifies for Shingles Vaccine? {YES/NO:21197}  Zostavax completed {YES/NO:21197}  {Shingrix Completed?:2101804}  Screening Tests Health Maintenance  Topic Date Due  . MAMMOGRAM  10/26/2011  . COLONOSCOPY (Pts 45-56yrs Insurance coverage will need to be confirmed)  09/11/2018  . COVID-19 Vaccine (2 - Janssen risk 3-dose series) 07/10/2019  . TETANUS/TDAP  10/26/2019  . DEXA SCAN  Never done  . PNA vac Low Risk Adult (1 of 2 - PCV13) Never done  . PAP SMEAR-Modifier  04/26/2036 (Originally 12/04/2015)  . INFLUENZA VACCINE  09/11/2020  . Hepatitis C Screening  Completed  . HIV Screening  Completed  . HPV VACCINES  Aged Out    Health Maintenance  Health Maintenance Due  Topic Date Due  . MAMMOGRAM  10/26/2011  . COLONOSCOPY (Pts 45-77yrs Insurance coverage will need to be confirmed)  09/11/2018  . COVID-19 Vaccine (2 - Janssen risk 3-dose series) 07/10/2019  . TETANUS/TDAP  10/26/2019  . DEXA SCAN  Never done  . PNA vac Low Risk Adult (1 of 2 - PCV13) Never done    {Colorectal cancer screening:2101809}  {Mammogram status:21018020}  {Bone Density status:21018021}  Lung Cancer Screening: (Low Dose CT Chest recommended if Age 86-80 years, 30 pack-year currently smoking OR have quit w/in 15years.) {DOES NOT does:27190::"does not"} qualify.   Lung Cancer Screening Referral: ***  Additional Screening:  Hepatitis C Screening: {DOES NOT does:27190::"does not"} qualify; Completed ***  Vision Screening: Recommended annual ophthalmology exams for early detection of glaucoma and other disorders of the eye. Is the patient up to date with their annual eye exam?  {YES/NO:21197} Who is the provider or what is the name of the  office in which the patient attends annual eye exams? *** If pt is not established with a provider, would they like to be referred to a provider to establish care? {YES/NO:21197}.   Dental Screening: Recommended annual dental exams for proper oral hygiene  Community Resource Referral / Chronic Care Management: CRR required this visit?  {YES/NO:21197}  CCM required this visit?  {  YES/NO:21197}     Plan:     I have personally reviewed and noted the following in the patient's chart:   . Medical and social history . Use of alcohol, tobacco or illicit drugs  . Current medications and supplements including opioid prescriptions. {Opioid Prescriptions:8045484148} . Functional ability and status . Nutritional status . Physical activity . Advanced directives . List of other physicians . Hospitalizations, surgeries, and ER visits in previous 12 months . Vitals . Screenings to include cognitive, depression, and falls . Referrals and appointments  In addition, I have reviewed and discussed with patient certain preventive protocols, quality metrics, and best practice recommendations. A written personalized care plan for preventive services as well as general preventive health recommendations were provided to patient.     Randel Pigg, LPN   03/01/4172   Nurse Notes: ***

## 2020-06-27 ENCOUNTER — Ambulatory Visit: Payer: Medicare Other | Admitting: Endocrinology

## 2020-06-29 ENCOUNTER — Other Ambulatory Visit: Payer: Self-pay

## 2020-06-29 ENCOUNTER — Ambulatory Visit: Payer: Medicare Other

## 2020-06-29 ENCOUNTER — Other Ambulatory Visit: Payer: Self-pay | Admitting: Family Medicine

## 2020-06-29 NOTE — Progress Notes (Deleted)
Subjective:   [name] is a 65 y.o. female who presents for an Initial Medicare Annual Wellness Visit.  I connected with [place]  today by telephone and verified that I am speaking with the correct person using two identifiers. Location patient: home Location provider: work Persons participating in the virtual visit: patient, provider.   I discussed the limitations, risks, security and privacy concerns of performing an evaluation and management service by telephone and the availability of in person appointments. I also discussed with the patient that there may be a patient responsible charge related to this service. The patient expressed understanding and verbally consented to this telephonic visit.    Interactive audio and video telecommunications were attempted between this provider and patient, however failed, due to patient having technical difficulties OR patient did not have access to video capability.  We continued and completed visit with audio only.     Review of Systems    n/a       Objective:    There were no vitals filed for this visit. There is no height or weight on file to calculate BMI.  Advanced Directives 10/07/2019 08/29/2019 09/11/2018 09/10/2018 09/09/2018 11/05/2017 04/30/2014  Does Patient Have a Medical Advance Directive? No No No No No No No  Would patient like information on creating a medical advance directive? No - Patient declined - No - Patient declined No - Patient declined - No - Patient declined No - patient declined information    Current Medications (verified) Outpatient Encounter Medications as of 06/29/2020  Medication Sig  . albuterol (VENTOLIN HFA) 108 (90 Base) MCG/ACT inhaler Inhale 2 puffs into the lungs every 6 (six) hours as needed for wheezing or shortness of breath.  Marland Kitchen amoxicillin-clavulanate (AUGMENTIN) 875-125 MG tablet Take 1 tablet by mouth 2 (two) times daily.  Marland Kitchen aspirin EC 81 MG tablet Take 1 tablet (81 mg total) by  mouth daily. Swallow whole.  Marland Kitchen atorvastatin (LIPITOR) 40 MG tablet Take 1 tablet (40 mg total) by mouth daily.  . Blood Pressure Monitoring DEVI Use as needed DX I10  . clopidogrel (PLAVIX) 75 MG tablet Take 4 tablets the first day then one tablet by mouth daily  . fluticasone (FLONASE) 50 MCG/ACT nasal spray Place 2 sprays into both nostrils daily.  Marland Kitchen guaiFENesin (MUCINEX) 600 MG 12 hr tablet Take 600 mg by mouth 2 (two) times daily as needed.  Marland Kitchen losartan (COZAAR) 50 MG tablet Take 1 tablet (50 mg total) by mouth daily.  . Menthol, Topical Analgesic, (BIOFREEZE EX) Apply 1 application topically daily as needed (back pain).  . methimazole (TAPAZOLE) 10 MG tablet Take 2 tablets (20 mg total) by mouth daily.  . metoprolol succinate (TOPROL-XL) 50 MG 24 hr tablet Take 1 tablet (50 mg total) by mouth daily. Take with or immediately following a meal.  . mupirocin ointment (BACTROBAN) 2 % Apply 1 application topically 2 (two) times daily.  . nitroGLYCERIN (NITROSTAT) 0.4 MG SL tablet Place 1 tablet (0.4 mg total) under the tongue every 5 (five) minutes as needed for chest pain.  Marland Kitchen ondansetron (ZOFRAN ODT) 8 MG disintegrating tablet Take 1 tablet (8 mg total) by mouth every 8 (eight) hours as needed for nausea or vomiting.  . pantoprazole (PROTONIX) 40 MG tablet Take 1 tablet (40 mg total) by mouth daily.  Marland Kitchen venlafaxine XR (EFFEXOR-XR) 150 MG 24 hr capsule TAKE ONE CAPSULE BY MOUTH EVERY MORNING WITH BREAKFAST   No facility-administered encounter medications on file as of  06/29/2020.    Allergies (verified) Codeine, Lisinopril, Sulfonamide derivatives, and Tetracyclines & related   History: Past Medical History:  Diagnosis Date  . Allergy   . Anxiety   . Arthritis    RA  . Asthma   . Chronic back pain   . COPD (chronic obstructive pulmonary disease) ([place])   . Depression   . Esophageal stricture   . GERD (gastroesophageal reflux disease)   . Heart murmur   . Hematemesis 09/10/2018  .  Hyperlipidemia   . Hypertension   . IBS (irritable bowel syndrome)   . Interstitial cystitis   . Neuromuscular disorder ([place])    fibromyalgia   Past Surgical History:  Procedure Laterality Date  . ABDOMINAL HYSTERECTOMY    . APPENDECTOMY    . BIOPSY  09/11/2018   Procedure: BIOPSY;  Surgeon: Rush Landmark Telford Nab., MD;  Location: [place];  Service: Gastroenterology;;  . CERVICAL FUSION    . CERVICAL LAMINECTOMY    . CORONARY STENT INTERVENTION N/A 10/07/2019   Procedure: CORONARY STENT INTERVENTION;  Surgeon: Burnell Blanks, MD;  Location: [place] [facility];  Service: Cardiovascular;  Laterality: N/A;  . ESOPHAGOGASTRODUODENOSCOPY    . ESOPHAGOGASTRODUODENOSCOPY (EGD) WITH PROPOFOL N/A 09/11/2018   Procedure: ESOPHAGOGASTRODUODENOSCOPY (EGD) WITH PROPOFOL;  Surgeon: Rush Landmark Telford Nab., MD;  Location: [place];  Service: Gastroenterology;  Laterality: N/A;  . FINGER SURGERY    . LEFT HEART CATH AND CORONARY ANGIOGRAPHY N/A 10/07/2019   Procedure: LEFT HEART CATH AND CORONARY ANGIOGRAPHY;  Surgeon: Burnell Blanks, MD;  Location: [place] [facility];  Service: Cardiovascular;  Laterality: N/A;  . LUMBAR FUSION    . SAVORY DILATION N/A 09/11/2018   Procedure: SAVORY DILATION;  Surgeon: Rush Landmark Telford Nab., MD;  Location: [place];  Service: Gastroenterology;  Laterality: N/A;  . TONSILLECTOMY     Family History  Problem Relation Age of Onset  . Dementia Mother   . Heart attack Father   . Coronary artery disease Father   . Cancer Brother        esophageal  . Esophageal cancer Brother   . Coronary artery disease Paternal Aunt   . Stomach cancer Paternal Aunt   . Coronary artery disease Paternal Grandmother   . Aneurysm Brother        aortic  . Rectal cancer Neg Hx   . Colon cancer Neg Hx   . Thyroid disease Neg Hx    Social History   Socioeconomic History  . Marital status: Single    Spouse name: Not on file  . Number of children: Not on  file  . Years of education: Not on file  . Highest education level: Not on file  Occupational History  . Occupation: disability  Tobacco Use  . Smoking status: Current Every Day Smoker    Packs/day: 0.50    Types: Cigarettes  . Smokeless tobacco: Never Used  . Tobacco comment: trying to quit   Vaping Use  . Vaping Use: Never used  Substance and Sexual Activity  . Alcohol use: No    Alcohol/week: 0.0 standard drinks  . Drug use: No  . Sexual activity: Not on file  Other Topics Concern  . Not on file  Social History Narrative  . Not on file   Social Determinants of Health   Financial Resource Strain: Low Risk   . Difficulty of Paying Living Expenses: Not very hard  Food Insecurity: Not on file  Transportation Needs: No Transportation Needs  . Lack of  Transportation (Medical): No  . Lack of Transportation (Non-Medical): No  Physical Activity: Not on file  Stress: Not on file  Social Connections: Not on file    Tobacco Counseling Ready to quit: Not Answered Counseling given: Not Answered Comment: trying to quit    Clinical Intake:                 Diabetic?no         Activities of Daily Living No flowsheet data found.  Patient Care Team: [name], MD as PCP - General (Family Medicine) Burnell Blanks, MD as PCP - Cardiology (Cardiology) Viona Gilmore, [facility] as Pharmacist (Pharmacist)  Indicate any recent Medical Services you may have received from other than [facility] providers in the past year (date may be approximate).     Assessment:   This is a routine wellness examination for [name].  Hearing/Vision screen No exam data present  Dietary issues and exercise activities discussed:    Goals Addressed   None    Depression Screen PHQ 2/9 Scores 11/24/2019  PHQ - 2 Score 5  PHQ- 9 Score 19    Fall Risk Fall Risk  08/23/2015  Falls in the past year? No    FALL RISK PREVENTION PERTAINING TO THE HOME:  Any stairs in  or around the home? Yes  If so, are there any without handrails? No  Home free of loose throw rugs in walkways, pet beds, electrical cords, etc? Yes  Adequate lighting in your home to reduce risk of falls? Yes   ASSISTIVE DEVICES UTILIZED TO PREVENT FALLS:  Life alert? No  Use of a cane, walker or w/c? {YES/NO:21197} Grab bars in the bathroom? {YES/NO:21197} Shower chair or bench in shower? {YES/NO:21197} Elevated toilet seat or a handicapped toilet? {YES/NO:21197}    Cognitive Function:       Normal cognitive status assessed by direct observation by this Nurse Health Advisor. No abnormalities found.    Immunizations Immunization History  Administered Date(s) Administered  . Influenza Split 12/16/2011  . Influenza Whole 11/15/2008, 10/25/2009  . Influenza,inj,Quad PF,6+ Mos 11/18/2012, 11/29/2013, 11/08/2014, 03/14/2017  . Janssen (J&J) SARS-COV-2 Vaccination 06/12/2019  . Td 10/25/2009    {TDAP status:2101805}  {Flu Vaccine status:2101806}  {Pneumococcal vaccine status:2101807}  {Covid-19 vaccine status:2101808}  Qualifies for Shingles Vaccine? {YES/NO:21197}  Zostavax completed {YES/NO:21197}  {Shingrix Completed?:2101804}  Screening Tests Health Maintenance  Topic Date Due  . MAMMOGRAM  10/26/2011  . COLONOSCOPY (Pts 45-51yrs Insurance coverage will need to be confirmed)  09/11/2018  . COVID-19 Vaccine (2 - Janssen risk 3-dose series) 07/10/2019  . TETANUS/TDAP  10/26/2019  . DEXA SCAN  Never done  . PNA vac Low Risk Adult (1 of 2 - PCV13) Never done  . PAP SMEAR-Modifier  04/26/2036 (Originally 12/04/2015)  . INFLUENZA VACCINE  09/11/2020  . Hepatitis C Screening  Completed  . HIV Screening  Completed  . HPV VACCINES  Aged Out    Health Maintenance  Health Maintenance Due  Topic Date Due  . MAMMOGRAM  10/26/2011  . COLONOSCOPY (Pts 45-34yrs Insurance coverage will need to be confirmed)  09/11/2018  . COVID-19 Vaccine (2 - Janssen risk 3-dose  series) 07/10/2019  . TETANUS/TDAP  10/26/2019  . DEXA SCAN  Never done  . PNA vac Low Risk Adult (1 of 2 - PCV13) Never done    {Colorectal cancer screening:2101809}  {Mammogram status:21018020}  {Bone Density status:21018021}  Lung Cancer Screening: (Low Dose CT Chest recommended if Age 13-80 years, 55  pack-year currently smoking OR have quit w/in 15years.) {DOES NOT does:27190::"does not"} qualify.   Lung Cancer Screening Referral: ***  Additional Screening:  Hepatitis C Screening: {DOES NOT does:27190::"does not"} qualify; Completed ***  Vision Screening: Recommended annual ophthalmology exams for early detection of glaucoma and other disorders of the eye. Is the patient up to date with their annual eye exam?  {YES/NO:21197} Who is the provider or what is the name of the office in which the patient attends annual eye exams? *** If pt is not established with a provider, would they like to be referred to a provider to establish care? {YES/NO:21197}.   Dental Screening: Recommended annual dental exams for proper oral hygiene  Community Resource Referral / Chronic Care Management: CRR required this visit?  {YES/NO:21197}  CCM required this visit?  {YES/NO:21197}     Plan:     I have personally reviewed and noted the following in the patient's chart:   . Medical and social history . Use of alcohol, tobacco or illicit drugs  . Current medications and supplements including opioid prescriptions. {Opioid Prescriptions:[phone]} . Functional ability and status . Nutritional status . Physical activity . Advanced directives . List of other physicians . Hospitalizations, surgeries, and ER visits in previous 12 months . Vitals . Screenings to include cognitive, depression, and falls . Referrals and appointments  In addition, I have reviewed and discussed with patient certain preventive protocols, quality metrics, and best practice recommendations. A written personalized  care plan for preventive services as well as general preventive health recommendations were provided to patient.     Randel Pigg, LPN   5/99/3570   Nurse Notes: ***

## 2020-07-06 ENCOUNTER — Telehealth: Payer: Self-pay | Admitting: Pharmacist

## 2020-07-06 NOTE — Chronic Care Management (AMB) (Addendum)
Chronic Care Management Pharmacy Assistant   Name: Laurie [name]  MRN: [id number] DOB: [birth year]  Reason for Encounter: Medication Review-Medication Coordination Call and General Adherence Call       Recent office visits:  . 04.27.2022 [name], MD Video Visit o Medication prescribed o Augmentin 875 mg twice daily for 10 days o Ondansetron 8 mg Oral Every 8 hours PRN  Recent consult visits:  None  Hospital visits:  None in previous 6 months  Medications: Outpatient Encounter Medications as of 07/06/2020  Medication Sig  . albuterol (VENTOLIN HFA) 108 (90 Base) MCG/ACT inhaler Inhale 2 puffs into the lungs every 6 (six) hours as needed for wheezing or shortness of breath.  Marland Kitchen amoxicillin-clavulanate (AUGMENTIN) 875-125 MG tablet Take 1 tablet by mouth 2 (two) times daily.  Marland Kitchen aspirin EC 81 MG tablet Take 1 tablet (81 mg total) by mouth daily. Swallow whole.  Marland Kitchen atorvastatin (LIPITOR) 40 MG tablet Take 1 tablet (40 mg total) by mouth daily.  . Blood Pressure Monitoring DEVI Use as needed DX I10  . clopidogrel (PLAVIX) 75 MG tablet Take 4 tablets the first day then one tablet by mouth daily  . fluticasone (FLONASE) 50 MCG/ACT nasal spray Place 2 sprays into both nostrils daily.  Marland Kitchen guaiFENesin (MUCINEX) 600 MG 12 hr tablet Take 600 mg by mouth 2 (two) times daily as needed.  Marland Kitchen losartan (COZAAR) 50 MG tablet Take 1 tablet (50 mg total) by mouth daily.  . Menthol, Topical Analgesic, (BIOFREEZE EX) Apply 1 application topically daily as needed (back pain).  . methimazole (TAPAZOLE) 10 MG tablet Take 2 tablets (20 mg total) by mouth daily.  . metoprolol succinate (TOPROL-XL) 50 MG 24 hr tablet Take 1 tablet (50 mg total) by mouth daily. Take with or immediately following a meal.  . mupirocin ointment (BACTROBAN) 2 % Apply 1 application topically 2 (two) times daily.  . nitroGLYCERIN (NITROSTAT) 0.4 MG SL tablet Place 1 tablet (0.4 mg total) under the tongue every 5 (five)  minutes as needed for chest pain.  Marland Kitchen ondansetron (ZOFRAN ODT) 8 MG disintegrating tablet Take 1 tablet (8 mg total) by mouth every 8 (eight) hours as needed for nausea or vomiting.  . pantoprazole (PROTONIX) 40 MG tablet TAKE ONE TABLET BY MOUTH EVERY MORNING  . venlafaxine XR (EFFEXOR-XR) 150 MG 24 hr capsule TAKE ONE CAPSULE BY MOUTH EVERY MORNING WITH BREAKFAST   No facility-administered encounter medications on file as of 07/06/2020.   Reviewed chart for medication changes ahead of medication coordination call.  BP Readings from Last 3 Encounters:  05/01/20 120/74  04/26/20 (!) 150/70  03/13/20 (!) 176/60    Lab Results  Component Value Date   HGBA1C 5.4 09/13/2018     Patient obtains medications through Adherence Packaging  30 Days  Last adherence delivery included:  . Losartan (COZAAR) 50 mg: one tablet with breakfast . Venlafaxine XR (EFFEXOR-XR) 150 mg: one capsule by mouth with breakfast . Pantoprazole (PROTONIX) 40 mg: one tablet at breakfast . Atorvastatin (LIPITOR) 40 mg: one tablet at dinner . Methimazole (TAPAZOLE) 10 mg: two tablets at dinner . Metoprolol succinate (TOPROL-XL) 25 MG 24 hr: one tablet at breakfast . Albuterol (VENTOLIN HFA) 108 (90 Base) MCG/ACT: Inhale 2 puffs every 6 hours as needed . Fluticasone (FLONASE) 50 MCG/ACT nasal spray: Place 2 sprays into both nostrils daily . Clopidogrel (PLAVIX) 75 mg: one tablet at breakfast . Aspirin EC 81 mg: one tablet at dinner  Patient declined the  following medication last month due to PRN use. . Nitroglycerin (NITROSTAT) 0.4 MG SL: Place 1 tablet (0.4 mg total) under the tongue every 5 (five) minutes as needed for chest pain  Patient is due for next adherence delivery on: 07/12/2020. Called patient and reviewed medications and coordinated delivery. This delivery to include: . Losartan (COZAAR) 50 mg: one tablet with breakfast . Venlafaxine XR (EFFEXOR-XR) 150 mg: one capsule by mouth with  breakfast . Pantoprazole (PROTONIX) 40 mg: one tablet at breakfast . Atorvastatin (LIPITOR) 40 mg: one tablet at dinner . Methimazole (TAPAZOLE) 10 mg: two tablets at dinner . Metoprolol succinate (TOPROL-XL) 25 MG 24 hr: one tablet at breakfast . Albuterol (VENTOLIN HFA) 108 (90 Base) MCG/ACT: Inhale 2 puffs every 6 hours as needed . Fluticasone (FLONASE) 50 MCG/ACT nasal spray: Place 2 sprays into both nostrils daily . Clopidogrel (PLAVIX) 75 mg: one tablet at breakfast . Aspirin EC 81 mg: one tablet at dinner  Patient declined the following medications due to PRN use. . Nitroglycerin (NITROSTAT) 0.4 MG SL: Place 1 tablet (0.4 mg total) under the tongue every 5 (five) minutes as needed for chest pain  She currently does not need refill.  Confirmed delivery date of 07/13/2020 , advised patient that pharmacy will contact them the morning of delivery.  Star Rating Drugs:  Dispensed Quantity Pharmacy  Losartan 50 mg 04.29.2022 30 [facility]  Atorvastatin 40 mg 04.29.2022 30 [facility]     [name], [place] Pharmacist Assistant [phone]

## 2020-07-07 ENCOUNTER — Telehealth: Payer: Medicare Other

## 2020-07-25 ENCOUNTER — Telehealth: Payer: Self-pay | Admitting: Family Medicine

## 2020-07-25 NOTE — Telephone Encounter (Signed)
Left message for patient to call back and schedule Medicare Annual Wellness Visit (AWV) either virtually or in office.  AWV-I per [place] 10/13/10 please schedule at anytime with [facility] Nurse Health Advisor 1 or 2   This should be a 45 minute visit.

## 2020-07-31 ENCOUNTER — Other Ambulatory Visit: Payer: Self-pay | Admitting: Family Medicine

## 2020-07-31 ENCOUNTER — Ambulatory Visit: Payer: Medicare Other | Admitting: Family Medicine

## 2020-07-31 DIAGNOSIS — Z0289 Encounter for other administrative examinations: Secondary | ICD-10-CM

## 2020-08-02 ENCOUNTER — Telehealth: Payer: Self-pay | Admitting: Pharmacist

## 2020-08-02 NOTE — Chronic Care Management (AMB) (Signed)
Chronic Care Management Pharmacy Assistant   Name: Laurie [name]  MRN: [id number] DOB: [birth year]  Reason for Encounter: Medication Review-Medication Coordination Call   Recent office visits:  None  Recent consult visits:  None  Hospital visits:  None in previous 6 months  Medications: Outpatient Encounter Medications as of 08/02/2020  Medication Sig   albuterol (VENTOLIN HFA) 108 (90 Base) MCG/ACT inhaler Inhale 2 puffs into the lungs every 6 (six) hours as needed for wheezing or shortness of breath.   amoxicillin-clavulanate (AUGMENTIN) 875-125 MG tablet Take 1 tablet by mouth 2 (two) times daily.   aspirin EC 81 MG tablet Take 1 tablet (81 mg total) by mouth daily. Swallow whole.   atorvastatin (LIPITOR) 40 MG tablet Take 1 tablet (40 mg total) by mouth daily.   Blood Pressure Monitoring DEVI Use as needed DX I10   clopidogrel (PLAVIX) 75 MG tablet Take 4 tablets the first day then one tablet by mouth daily   fluticasone (FLONASE) 50 MCG/ACT nasal spray Place 2 sprays into both nostrils daily.   guaiFENesin (MUCINEX) 600 MG 12 hr tablet Take 600 mg by mouth 2 (two) times daily as needed.   losartan (COZAAR) 50 MG tablet Take 1 tablet (50 mg total) by mouth daily.   Menthol, Topical Analgesic, (BIOFREEZE EX) Apply 1 application topically daily as needed (back pain).   methimazole (TAPAZOLE) 10 MG tablet Take 2 tablets (20 mg total) by mouth daily.   metoprolol succinate (TOPROL-XL) 50 MG 24 hr tablet Take 1 tablet (50 mg total) by mouth daily. Take with or immediately following a meal.   mupirocin ointment (BACTROBAN) 2 % Apply 1 application topically 2 (two) times daily.   nitroGLYCERIN (NITROSTAT) 0.4 MG SL tablet Place 1 tablet (0.4 mg total) under the tongue every 5 (five) minutes as needed for chest pain.   ondansetron (ZOFRAN ODT) 8 MG disintegrating tablet Take 1 tablet (8 mg total) by mouth every 8 (eight) hours as needed for nausea or vomiting.   pantoprazole  (PROTONIX) 40 MG tablet TAKE ONE TABLET BY MOUTH EVERY MORNING   venlafaxine XR (EFFEXOR-XR) 150 MG 24 hr capsule TAKE ONE CAPSULE BY MOUTH EVERY MORNING   No facility-administered encounter medications on file as of 08/02/2020.   Reviewed chart for medication changes ahead of medication coordination call. No OVs, Consults, or hospital visits since last care coordination call/Pharmacist visit. ( No medication changes indicated OR if recent visit, treatment plan here.  BP Readings from Last 3 Encounters:  05/01/20 120/74  04/26/20 (!) 150/70  03/13/20 (!) 176/60    Lab Results  Component Value Date   HGBA1C 5.4 09/13/2018     Patient obtains medications through Adherence Packaging  30 Days  Last adherence delivery included:  Losartan (COZAAR) 50 mg: one tablet with breakfast Venlafaxine XR (EFFEXOR-XR) 150 mg: one capsule by mouth with breakfast Pantoprazole (PROTONIX) 40 mg: one tablet at breakfast Atorvastatin (LIPITOR) 40 mg: one tablet at dinner Methimazole (TAPAZOLE) 10 mg: two tablets at dinner Metoprolol succinate (TOPROL-XL) 25 MG 24 hr: one tablet at breakfast Albuterol (VENTOLIN HFA) 108 (90 Base) MCG/ACT: Inhale 2 puffs every 6 hours as needed Fluticasone (FLONASE) 50 MCG/ACT nasal spray: Place 2 sprays into both nostrils daily Clopidogrel (PLAVIX) 75 mg: one tablet at breakfast Aspirin EC 81 mg: one tablet at dinner  Patient declined the following medication last month due to PRN use/additional supply on hand. Nitroglycerin (NITROSTAT) 0.4 MG SL: Place 1 tablet (0.4 mg total) under the  tongue every 5 (five) minutes as needed for chest pain  Patient is due for next adherence delivery on: 08/10/2020. Called patient and reviewed medications and coordinated delivery. This delivery to include: Losartan (COZAAR) 50 mg: one tablet with breakfast Venlafaxine XR (EFFEXOR-XR) 150 mg: one capsule by mouth with breakfast Pantoprazole (PROTONIX) 40 mg: one tablet at  breakfast Atorvastatin (LIPITOR) 40 mg: one tablet at dinner Methimazole (TAPAZOLE) 10 mg: two tablets at dinner Metoprolol succinate (TOPROL-XL) 25 MG 24 hr: one tablet at breakfast Albuterol (VENTOLIN HFA) 108 (90 Base) MCG/ACT: Inhale 2 puffs every 6 hours as needed Fluticasone (FLONASE) 50 MCG/ACT nasal spray: Place 2 sprays into both nostrils daily Clopidogrel (PLAVIX) 75 mg: one tablet at breakfast Aspirin EC 81 mg: one tablet at dinner  Patient declined the following medications  due to PRN use. Nitroglycerin (NITROSTAT) 0.4 MG SL: Place 1 tablet (0.4 mg total) under the tongue every 5 (five) minutes as needed for chest pain She currently does not need refills.  Confirmed delivery date of 08/10/2020, advised patient that pharmacy will contact them the morning of delivery.  Star Rating Drugs: Medication Dispensed  Quantity Pharmacy  Losartan 50 mg  05.30.2022 30 [facility]  Atorvastatin 20 mg  05.30.2022 30 [facility]   Laurie [name], Laurie [name] Pharmacist Assistant [phone]

## 2020-08-08 ENCOUNTER — Telehealth: Payer: Self-pay | Admitting: Pharmacist

## 2020-08-08 NOTE — Chronic Care Management (AMB) (Signed)
    Chronic Care Management Pharmacy Assistant   Name: Laurie [name]  MRN: [id number] DOB: Mar 23, 1955  08/08/20- Called patient to remind of appointment with Jeni Salles) on (08/09/20 at 10am by phone)   No answer, left message of appointment date, time and type of appointment (either telephone or in person). Left message to have all medications, supplements, blood pressure and/or blood sugar logs available during appointment and to return call if need to reschedule.   Star Rating Drug:  Atorvastatin 40mg  - last filled on 08/07/20 30DS at [facility] Losartan 50mg  - last filled on 08/07/20 30DS at [facility]  Any gaps in medications fill history? NoHoover Browns CMA  Clinical Pharmacist Assistant [phone]

## 2020-08-08 NOTE — Progress Notes (Signed)
Chronic Care Management Pharmacy Note  08/09/2020 Name:  Laurie [name] MRN:  [id number] DOB:  06-29-1955  Summary: Patient has been under a lot of stress and also continues to have neck pain  Recommendations/Changes made from today's visit: -Recommended setting up a visit with behavioral health and provided phone number to call [phone]). -Recommended follow up with PCP for neck pain and mood -Recommended setting a timer for medications and following a routine to avoid missed doses -Consider dose increase for venlafaxine based on current mood  Plan: Follow up with PCP ASAP Follow up in 3 months  Subjective: Laurie [name] is an 65 y.o. year old female who is a primary patient of Burchette, Laurie Sierras, MD.  The CCM team was consulted for assistance with disease management and care coordination needs.    Engaged with patient by telephone for follow up visit in response to provider referral for pharmacy case management and/or care coordination services.   Consent to Services:  The patient was given information about Chronic Care Management services, agreed to services, and gave verbal consent prior to initiation of services.  Please see initial visit note for detailed documentation.   Patient Care Team: [name], MD as PCP - General (Family Medicine) Burnell Blanks, MD as PCP - Cardiology (Cardiology) Viona Gilmore, [facility] as Pharmacist (Pharmacist)  Recent office visits: 06/07/20 Laurie Littler, MD: Patient presented for video visit for sinusitis. Prescribed Augmentin x 10 days and Zofran PRN.  05/01/20 Laurie Littler, MD: Patient presented for neck pain. Prescribed Augmentin x 10 days and recommended OTC Aleve.  02/23/20 Laurie Mitts, MD: Patient presented for office visit for dysuria. Urine culture was negative for growth.  01/05/20 Laurie Littler, MD: Patient presented for video visit for sinusitis. Prescribed albuterol inhaler PRN.  Recent  consult visits: 04/26/20 Laurie Shin, MD (endo): Patient presented for hyperthyroidism follow up. Decreased methimazole to 2 tablets once daily. Placed referral for ophthalmology.  03/13/20 Laurie Shin, MD (endo): Patient presented for hyperthyroidism. TSH still low but T4 is normal. Increased metoprolol to 50 mg daily.   02/28/20 Laurie Chandler, MD (cardiology): Patient presented for CAD follow up.   02/17/20 Laurie Shin, MD (endo): Patient presented for hyperthyroidism initial visit. Prescribed methimazole 20 mg BID and metoprolol succinate 25 mg daily.   Hospital visits: None in previous 6 months  Objective:  Lab Results  Component Value Date   CREATININE 0.88 02/23/2020   BUN 20 02/23/2020   GFR 69.04 02/23/2020   GFRNONAA 66 09/27/2019   GFRAA 76 09/27/2019   NA 136 02/23/2020   K 4.2 02/23/2020   CALCIUM 9.3 02/23/2020   CO2 27 02/23/2020    Lab Results  Component Value Date/Time   HGBA1C 5.4 09/13/2018 03:11 AM   GFR 69.04 02/23/2020 05:01 PM   GFR 57.02 (L) 07/23/2019 02:41 PM    Last diabetic Eye exam: No results found for: HMDIABEYEEXA  Last diabetic Foot exam: No results found for: HMDIABFOOTEX   Lab Results  Component Value Date   CHOL 159 09/03/2019   HDL 46 (L) 09/03/2019   LDLCALC 81 09/03/2019   LDLDIRECT 123.0 03/14/2017   TRIG 227 (H) 09/03/2019   CHOLHDL 3.5 09/03/2019    Hepatic Function Latest Ref Rng & Units 02/23/2020 09/03/2019 07/23/2019  Total Protein 6.0 - 8.3 g/dL 6.7 6.9 7.0  Albumin 3.5 - 5.2 g/dL 3.7 - 4.2  AST 0 - 37 U/L 12 9(L) 11  ALT 0 - 35 U/L 14 14  13  Alk Phosphatase 39 - 117 U/L 77 - 76  Total Bilirubin 0.2 - 1.2 mg/dL 0.3 0.5 0.3  Bilirubin, Direct 0.0 - 0.2 mg/dL - 0.1 -    Lab Results  Component Value Date/Time   TSH 0.04 (L) 04/26/2020 09:49 AM   TSH <0.01 Repeated and verified X2. (L) 03/13/2020 01:30 PM   FREET4 0.69 04/26/2020 09:49 AM   FREET4 1.56 03/13/2020 01:30 PM    CBC Latest Ref Rng & Units  02/23/2020 09/27/2019 08/30/2019  WBC 4.0 - 10.5 K/uL 6.9 10.1 10.4  Hemoglobin 12.0 - 15.0 g/dL 11.9(L) 13.1 11.9(L)  Hematocrit 36.0 - 46.0 % 35.3(L) 39.7 37.5  Platelets 150.0 - 400.0 K/uL 206.0 283 176    Lab Results  Component Value Date/Time   VD25OH 38.94 03/14/2017 09:48 AM   VD25OH 16.55 (L) 08/18/2015 08:36 AM    Clinical ASCVD: Yes  The 10-year ASCVD risk score Mikey Bussing DC Jr., et al., 2013) is: 11.4%   Values used to calculate the score:     Age: 54 years     Sex: Female     Is Non-Hispanic African American: No     Diabetic: No     Tobacco smoker: Yes     Systolic Blood Pressure: 024 mmHg     Is BP treated: Yes     HDL Cholesterol: 46 mg/dL     Total Cholesterol: 159 mg/dL    Depression screen PHQ 2/9 11/24/2019  Decreased Interest 3  Down, Depressed, Hopeless 2  PHQ - 2 Score 5  Altered sleeping 3  Tired, decreased energy 3  Change in appetite 1  Feeling bad or failure about yourself  2  Trouble concentrating 2  Moving slowly or fidgety/restless 2  Suicidal thoughts 1  PHQ-9 Score 19  Some recent data might be hidden     Social History   Tobacco Use  Smoking Status Every Day   Packs/day: 0.50   Pack years: 0.00   Types: Cigarettes  Smokeless Tobacco Never  Tobacco Comments   trying to quit    BP Readings from Last 3 Encounters:  05/01/20 120/74  04/26/20 (!) 150/70  03/13/20 (!) 176/60   Pulse Readings from Last 3 Encounters:  05/01/20 68  04/26/20 75  03/13/20 91   Wt Readings from Last 3 Encounters:  05/01/20 160 lb (72.6 kg)  04/26/20 165 lb 6.4 oz (75 kg)  03/13/20 156 lb 3.2 oz (70.9 kg)    Assessment/Interventions: Review of patient past medical history, allergies, medications, health status, including review of consultants reports, laboratory and other test data, was performed as part of comprehensive evaluation and provision of chronic care management services.   SDOH:  (Social Determinants of Health) assessments and interventions  performed: No   CCM Care Plan  Allergies  Allergen Reactions   Codeine Nausea And Vomiting   Lisinopril Cough   Sulfonamide Derivatives Nausea And Vomiting   Tetracyclines & Related Rash    Medications Reviewed Today     Reviewed by [name], MD (Physician) on 05/01/20 at [place] List Status: <None>   Medication Order Taking? Sig Documenting Provider Last Dose Status Informant  albuterol (VENTOLIN HFA) 108 (90 Base) MCG/ACT inhaler [id number] Yes Inhale 2 puffs into the lungs every 6 (six) hours as needed for wheezing or shortness of breath. [name], MD Taking Active   amoxicillin-clavulanate (AUGMENTIN) 875-125 MG tablet [id number] Yes Take 1 tablet by mouth 2 (two) times daily. Burchette, Laurie Sierras,  MD  Active   aspirin EC 81 MG tablet [id number] Yes Take 1 tablet (81 mg total) by mouth daily. Swallow whole. Burnell Blanks, MD Taking Active Self  atorvastatin (LIPITOR) 40 MG tablet [id number] Yes Take 1 tablet (40 mg total) by mouth daily. Burnell Blanks, MD Taking Active   Blood Pressure Monitoring DEVI [id number] Yes Use as needed DX I10 [name], MD Taking Active   clopidogrel (PLAVIX) 75 MG tablet [id number] Yes Take 4 tablets the first day then one tablet by mouth daily Burnell Blanks, MD Taking Active   fluticasone (FLONASE) 50 MCG/ACT nasal spray [id number] Yes Place 2 sprays into both nostrils daily. [name], MD Taking Active   guaiFENesin (MUCINEX) 600 MG 12 hr tablet [id number] Yes Take 600 mg by mouth 2 (two) times daily as needed. [provider] Taking Active   losartan (COZAAR) 50 MG tablet [id number] Yes Take 1 tablet (50 mg total) by mouth daily. [name], MD Taking Active   Menthol, Topical Analgesic, [facility] EX) [id number] Yes Apply 1 application topically daily as needed (back pain). [provider] Taking Active Self  methimazole (TAPAZOLE) 10 MG tablet [id number] Yes Take 2  tablets (20 mg total) by mouth daily. Laurie Shin, MD Taking Active   metoprolol succinate (TOPROL-XL) 50 MG 24 hr tablet [id number] Yes Take 1 tablet (50 mg total) by mouth daily. Take with or immediately following a meal. Laurie Shin, MD Taking Active   nitroGLYCERIN (NITROSTAT) 0.4 MG SL tablet [id number]  Place 1 tablet (0.4 mg total) under the tongue every 5 (five) minutes as needed for chest pain. Furth, Cadence H, PA-C  Expired 01/17/20 2359   pantoprazole (PROTONIX) 40 MG tablet [id number] Yes Take 1 tablet (40 mg total) by mouth daily. [name], MD Taking Active   venlafaxine XR (EFFEXOR-XR) 150 MG 24 hr capsule [id number] Yes TAKE ONE CAPSULE BY MOUTH EVERY MORNING WITH BREAKFAST [name], MD Taking Active             Patient Active Problem List   Diagnosis Date Noted   Eye swelling 04/27/2020   Hyperthyroidism 02/19/2020   Coronary artery disease involving native coronary artery of native heart with unstable angina pectoris ([place])    Substance abuse ([place]) 07/23/2019   Renal insufficiency 09/10/2018   Elevated troponin 09/10/2018   Leukocytosis 09/10/2018   Hematemesis 09/09/2018   Opiate withdrawal ([place]) 09/09/2018   Vitamin D deficiency 06/10/2018   Shortness of breath 11/05/2017   Elevated brain natriuretic peptide (BNP) level 11/05/2017   Polysubstance (excluding opioids) dependence, daily use ([place]) 11/05/2017   AKI (acute kidney injury) ([place]) 11/05/2017   Low serum vitamin D 08/23/2015   Hyperlipidemia 11/08/2014   Reflux esophagitis 03/23/2014   Dysphagia, pharyngoesophageal phase 03/23/2014   Pain in the chest 03/23/2014   Special screening for malignant neoplasms, colon 07/09/2013   Chest pain 04/23/2012   Dyspnea 04/23/2012   Murmur 04/23/2012   INTERSTITIAL CYSTITIS 01/01/2010   SWELLING, NECK 01/01/2010   CAD, NATIVE VESSEL 12/19/2009   Palpitations 11/21/2009   ABNORMAL CV (STRESS) TEST 11/21/2009   Essential hypertension 10/31/2009    ELECTROCARDIOGRAM, ABNORMAL 10/25/2009   PLANTAR FASCIITIS, BILATERAL 10/12/2009   CERVICAL RADICULOPATHY 07/04/2008   Pain in soft tissues of limb 03/25/2008   DEGENERATIVE DISC DISEASE, CERVICAL SPINE, W/RADICULOPATHY 09/08/2007   LOW BACK PAIN 09/08/2007   TOBACCO USE 03/23/2007   OSTEOARTHROSIS, LOCAL NOS, LOWER LEG 11/07/2006   STATE, SYMPTOMATIC MENOPAUSE/FEM CLIMACTERIC  10/27/2006   Depression, recurrent ([place]) 09/15/2006   Thoracic or lumbosacral neuritis or radiculitis, unspecified 09/15/2006   IBS 09/09/2006   STRICTURE, ESOPHAGEAL 03/16/2001    Immunization History  Administered Date(s) Administered   Influenza Split 12/16/2011   Influenza Whole 11/15/2008, 10/25/2009   Influenza,inj,Quad PF,6+ Mos 11/18/2012, 11/29/2013, 11/08/2014, 03/14/2017   Janssen (J&J) SARS-COV-2 Vaccination 06/12/2019   Td 10/25/2009   Recommended trying Tylenol - 650 mg - been trying   Conditions to be addressed/monitored:  Hypertension, Hyperlipidemia, Coronary Artery Disease, Depression, Tobacco use, Allergic Rhinitis and chronic pain  Care Plan : [place]  Updates made by Viona Gilmore, [place] since 08/09/2020 12:00 AM     Problem: Problem: Hypertension, Hyperlipidemia, Coronary Artery Disease, Depression, Tobacco use, Allergic Rhinitis and chronic pain      Long-Range Goal: Patient-Specific Goal   Start Date: 04/10/2020  Expected End Date: 04/10/2021  Recent Progress: On track  Priority: High  Note:   Current Barriers:  Unable to independently monitor therapeutic efficacy Unable to achieve control of blood pressure  Unable to self administer medications as prescribed   Pharmacist Clinical Goal(s):  Patient will achieve control of blood pressure as evidenced by home blood pressure readings adhere to plan to optimize therapeutic regimen for blood thinners as evidenced by report of adherence to recommended medication management changes through collaboration with  PharmD and provider.   Interventions: 1:1 collaboration with [name], MD regarding development and update of comprehensive plan of care as evidenced by provider attestation and co-signature Inter-disciplinary care team collaboration (see longitudinal plan of care) Comprehensive medication review performed; medication list updated in electronic medical record  Hypertension (BP goal <130/80) -Uncontrolled -Current treatment: Metoprolol succinate 50 mg 1 tablet daily Losartan 50 mg 1 tablet daily -Medications previously tried: none  -Current home readings: could not provide as patient does not have a BP cuff -Current dietary habits: did not discuss -Current exercise habits: did not discuss -Denies hypotensive/hypertensive symptoms -Educated on Importance of home blood pressure monitoring; Proper BP monitoring technique; -Counseled to monitor BP at home twice weekly, document, and provide log at future appointments -Recommended to continue current medication Counseled on obtaining a blood pressure cuff from insurance or the pharmacy  Hyperlipidemia: (LDL goal < 70) -Uncontrolled -Current treatment: Atorvastatin 40 mg 1 tablet daily -Medications previously tried: none  -Current dietary patterns: did not discuss -Current exercise habits: did not discuss -Educated on Cholesterol goals;  Importance of limiting foods high in cholesterol; -Recommended to continue current medication  Depression (Goal: minimize symptoms) -Controlled -Current treatment: Venlafaxine XR 150 mg 1 capsule in the morning with breakfast -Medications previously tried/failed:  Xanax, Elavil, Wellbutrin, Citalopram, Pristiq, Valium, Cymbalta  -PHQ9: 19 (11/24/19) -Educated on Benefits of medication for symptom control Benefits of cognitive-behavioral therapy with or without medication -Recommended to continue current medication Provided behavioral health phone number [phone]) to set up an  appointment.  Coronary artery disease (Goal: prevent heart events) -Controlled -Current treatment  Aspirin 81 mg daily  Clopidogrel 75 mg daily -Medications previously tried: Brilinta (shortness of breath)  -Recommended to continue current medication  Tobacco use (Goal quit smoking) -Uncontrolled -Previous quit attempts: Wellbutrin -Current treatment  No medications -Patient smokes After 30 minutes of waking -Patient triggers include: stress -On a scale of 1-10, reports MOTIVATION to quit is 8 -On a scale of 1-10, reports CONFIDENCE in quitting is 5 - Plan to reassess willingness to quit at follow up  -Recommended to continue  GERD (Goal:  minimize symptoms of heartburn or acid reflux) -Uncontrolled -Current treatment  Pantoprazole 40 mg daily -Medications previously tried: none  -Recommended to continue current medication Counseled on non-pharmacologic management of symptoms such as elevating the head of your bed, avoiding eating 2-3 hours before bed, avoiding triggering foods such as acidic, spicy, or fatty foods, eating smaller meals, and wearing clothes that are loose around the waist  Allergic rhinitis (Goal: minimize symptoms of allergies) -Controlled -Current treatment  Mucinex 600 mg twice daily as needed Flonase 50 mcg/act 2 spray daily as needed -Medications previously tried: none -Recommended to continue current medication  Hyperthryroidism (Goal: TSH 0.35-4.5) -Controlled -Current treatment  Methimazole 10 mg 2 tablets by mouth daily -Medications previously tried: none  -Recommended to continue current medication  Health Maintenance -Vaccine gaps: shingles, tetanus, COVID booster, Prevnar 20 -Current therapy:  BioFreeze Ex daily as needed Nitroglycerin 0.4 mg as needed -Educated on Cost vs benefit of each product must be carefully weighed by individual consumer -Patient is satisfied with current therapy and denies issues -Recommended to continue current  medication  Patient Goals/Self-Care Activities Patient will:  - take medications as prescribed focus on medication adherence by setting an alarm to remember to take medications on time check blood pressure twice weekly, document, and provide at future appointments  Follow Up Plan: Telephone follow up appointment with care management team member scheduled for: 3 months       Medication Assistance: None required.  Patient affirms current coverage meets needs.  Compliance/Adherence/Medication fill history: Care Gaps: Shingrix, tetanus, DEXA, mammogram, colonoscopy, COVID booster  Star-Rating Drugs: Atorvastatin 77m - last filled on 08/07/20 30DS at [facility] Losartan 590m- last filled on 08/07/20 30DS at [facility]  [facility] pharmacy [facility]:  [facility] [address] [id number]: [phone]: [phone] [phone] Lady [place]CF[place]: [phone]: [phone] pill box? No - adherence packaging Pt endorses 80% compliance - has not been taking clopidogrel x 2 weeks  We discussed: Benefits of medication synchronization, packaging and delivery as well as enhanced pharmacist oversight with [facility]. Patient decided to: Utilize [facility] pharmacy for medication synchronization, packaging and delivery  Care Plan and Follow Up Patient Decision:  Patient agrees to Care Plan and Follow-up.  Plan: Telephone follow up appointment with care management team member scheduled for:   3 months  MaJeni SallesPharmD BCRidottharmacist [id number] [phone]

## 2020-08-09 ENCOUNTER — Ambulatory Visit (INDEPENDENT_AMBULATORY_CARE_PROVIDER_SITE_OTHER): Payer: Medicare Other | Admitting: Pharmacist

## 2020-08-09 DIAGNOSIS — I10 Essential (primary) hypertension: Secondary | ICD-10-CM | POA: Diagnosis not present

## 2020-08-09 DIAGNOSIS — I25110 Atherosclerotic heart disease of native coronary artery with unstable angina pectoris: Secondary | ICD-10-CM

## 2020-08-09 NOTE — Patient Instructions (Signed)
Hi [name],  It was great to speak with you again! Please call behavioral health at [phone] to set up an appointment. I really think you would benefit from it. Also don't forget to call to set up an appointment with Dr. Elease Hashimoto as soon as possible!   Please reach out to me if you have any questions or need anything before our follow up!  Best, Maddie  Jeni Salles, PharmD, [place] [facility] [place]   Visit Information   Goals Addressed   None    Patient Care Plan: CCM Pharmacy Care Plan     Problem Identified: Problem: Hypertension, Hyperlipidemia, Coronary Artery Disease, Depression, Tobacco use, Allergic Rhinitis and chronic pain      Long-Range Goal: Patient-Specific Goal   Start Date: 04/10/2020  Expected End Date: 04/10/2021  Recent Progress: On track  Priority: High  Note:   Current Barriers:  Unable to independently monitor therapeutic efficacy Unable to achieve control of blood pressure  Unable to self administer medications as prescribed   Pharmacist Clinical Goal(s):  Patient will achieve control of blood pressure as evidenced by home blood pressure readings adhere to plan to optimize therapeutic regimen for blood thinners as evidenced by report of adherence to recommended medication management changes through collaboration with PharmD and provider.   Interventions: 1:1 collaboration with [name], MD regarding development and update of comprehensive plan of care as evidenced by provider attestation and co-signature Inter-disciplinary care team collaboration (see longitudinal plan of care) Comprehensive medication review performed; medication list updated in electronic medical record  Hypertension (BP goal <130/80) -Uncontrolled -Current treatment: Metoprolol succinate 50 mg 1 tablet daily Losartan 50 mg 1 tablet daily -Medications previously tried: none  -Current home readings: could not  provide as patient does not have a BP cuff -Current dietary habits: did not discuss -Current exercise habits: did not discuss -Denies hypotensive/hypertensive symptoms -Educated on Importance of home blood pressure monitoring; Proper BP monitoring technique; -Counseled to monitor BP at home twice weekly, document, and provide log at future appointments -Recommended to continue current medication Counseled on obtaining a blood pressure cuff from insurance or the pharmacy  Hyperlipidemia: (LDL goal < 70) -Uncontrolled -Current treatment: Atorvastatin 40 mg 1 tablet daily -Medications previously tried: none  -Current dietary patterns: did not discuss -Current exercise habits: did not discuss -Educated on Cholesterol goals;  Importance of limiting foods high in cholesterol; -Recommended to continue current medication  Depression (Goal: minimize symptoms) -Controlled -Current treatment: Venlafaxine XR 150 mg 1 capsule in the morning with breakfast -Medications previously tried/failed:  Xanax, Elavil, Wellbutrin, Citalopram, Pristiq, Valium, Cymbalta  -PHQ9: 19 (11/24/19) -Educated on Benefits of medication for symptom control Benefits of cognitive-behavioral therapy with or without medication -Recommended to continue current medication Provided behavioral health phone number [phone]) to set up an appointment.  Coronary artery disease (Goal: prevent heart events) -Controlled -Current treatment  Aspirin 81 mg daily  Clopidogrel 75 mg daily -Medications previously tried: Brilinta (shortness of breath)  -Recommended to continue current medication  Tobacco use (Goal quit smoking) -Uncontrolled -Previous quit attempts: Wellbutrin -Current treatment  No medications -Patient smokes After 30 minutes of waking -Patient triggers include: stress -On a scale of 1-10, reports MOTIVATION to quit is 8 -On a scale of 1-10, reports CONFIDENCE in quitting is 5 - Plan to reassess  willingness to quit at follow up  -Recommended to continue  GERD (Goal: minimize symptoms of heartburn or acid reflux) -Uncontrolled -Current treatment  Pantoprazole 40 mg  daily -Medications previously tried: none  -Recommended to continue current medication Counseled on non-pharmacologic management of symptoms such as elevating the head of your bed, avoiding eating 2-3 hours before bed, avoiding triggering foods such as acidic, spicy, or fatty foods, eating smaller meals, and wearing clothes that are loose around the waist  Allergic rhinitis (Goal: minimize symptoms of allergies) -Controlled -Current treatment  Mucinex 600 mg twice daily as needed Flonase 50 mcg/act 2 spray daily as needed -Medications previously tried: none -Recommended to continue current medication  Hyperthryroidism (Goal: TSH 0.35-4.5) -Controlled -Current treatment  Methimazole 10 mg 2 tablets by mouth daily -Medications previously tried: none  -Recommended to continue current medication  Health Maintenance -Vaccine gaps: shingles, tetanus, COVID booster, Prevnar 20 -Current therapy:  BioFreeze Ex daily as needed Nitroglycerin 0.4 mg as needed -Educated on Cost vs benefit of each product must be carefully weighed by individual consumer -Patient is satisfied with current therapy and denies issues -Recommended to continue current medication  Patient Goals/Self-Care Activities Patient will:  - take medications as prescribed focus on medication adherence by setting an alarm to remember to take medications on time check blood pressure twice weekly, document, and provide at future appointments  Follow Up Plan: Telephone follow up appointment with care management team member scheduled for: 3 months       Patient verbalizes understanding of instructions provided today and agrees to view in [place].  Telephone follow up appointment with pharmacy team member scheduled for: 3 months  Viona Gilmore, [facility]

## 2020-08-27 ENCOUNTER — Other Ambulatory Visit: Payer: Self-pay | Admitting: Family Medicine

## 2020-08-28 ENCOUNTER — Telehealth: Payer: Self-pay | Admitting: Family Medicine

## 2020-08-28 NOTE — Telephone Encounter (Signed)
Left message for patient to call back and schedule Medicare Annual Wellness Visit (AWV) either virtually or in office.   AWV-I per [place] 10/13/10 please schedule at anytime with [facility] Nurse Health Advisor 1 or 2   This should be a 45 minute visit.

## 2020-08-30 ENCOUNTER — Telehealth: Payer: Self-pay | Admitting: Pharmacist

## 2020-08-30 NOTE — Chronic Care Management (AMB) (Signed)
Chronic Care Management Pharmacy Assistant   Name: Laurie [name]  MRN: [id number] DOB: July 14, 1955  Reason for Encounter: Medication Review-Medication Coordination Call   Recent office visits:  None  Recent consult visits:  None  Hospital visits:  None in previous 6 months  Medications: Outpatient Encounter Medications as of 08/30/2020  Medication Sig   albuterol (VENTOLIN HFA) 108 (90 Base) MCG/ACT inhaler Inhale 2 puffs into the lungs every 6 (six) hours as needed for wheezing or shortness of breath.   amoxicillin-clavulanate (AUGMENTIN) 875-125 MG tablet Take 1 tablet by mouth 2 (two) times daily.   aspirin EC 81 MG tablet Take 1 tablet (81 mg total) by mouth daily. Swallow whole.   atorvastatin (LIPITOR) 40 MG tablet Take 1 tablet (40 mg total) by mouth daily.   Blood Pressure Monitoring DEVI Use as needed DX I10   clopidogrel (PLAVIX) 75 MG tablet Take 4 tablets the first day then one tablet by mouth daily   fluticasone (FLONASE) 50 MCG/ACT nasal spray INSTILL 2 SPRAYS IN EACH NOSTRIL ONCE DAILY   guaiFENesin (MUCINEX) 600 MG 12 hr tablet Take 600 mg by mouth 2 (two) times daily as needed.   losartan (COZAAR) 50 MG tablet Take 1 tablet (50 mg total) by mouth daily.   Menthol, Topical Analgesic, (BIOFREEZE EX) Apply 1 application topically daily as needed (back pain).   methimazole (TAPAZOLE) 10 MG tablet Take 2 tablets (20 mg total) by mouth daily.   metoprolol succinate (TOPROL-XL) 50 MG 24 hr tablet Take 1 tablet (50 mg total) by mouth daily. Take with or immediately following a meal.   mupirocin ointment (BACTROBAN) 2 % Apply 1 application topically 2 (two) times daily.   nitroGLYCERIN (NITROSTAT) 0.4 MG SL tablet Place 1 tablet (0.4 mg total) under the tongue every 5 (five) minutes as needed for chest pain.   ondansetron (ZOFRAN ODT) 8 MG disintegrating tablet Take 1 tablet (8 mg total) by mouth every 8 (eight) hours as needed for nausea or vomiting.   pantoprazole  (PROTONIX) 40 MG tablet TAKE ONE TABLET BY MOUTH EVERY MORNING   venlafaxine XR (EFFEXOR-XR) 150 MG 24 hr capsule TAKE ONE CAPSULE BY MOUTH EVERY MORNING   No facility-administered encounter medications on file as of 08/30/2020.   Reviewed chart for medication changes ahead of medication coordination call. No OVs, Consults, or hospital visits since last care coordination call/Pharmacist visit.  No medication changes indicated  BP Readings from Last 3 Encounters:  05/01/20 120/74  04/26/20 (!) 150/70  03/13/20 (!) 176/60    Lab Results  Component Value Date   HGBA1C 5.4 09/13/2018    Patient obtains medications through Adherence Packaging  30 Days  Last adherence delivery included:  Losartan (COZAAR) 50 mg: one tablet with breakfast Venlafaxine XR (EFFEXOR-XR) 150 mg: one capsule by mouth with breakfast Pantoprazole (PROTONIX) 40 mg: one tablet at breakfast Atorvastatin (LIPITOR) 40 mg: one tablet at dinner Methimazole (TAPAZOLE) 10 mg: two tablets at dinner Metoprolol succinate (TOPROL-XL) 25 MG 24 hr: one tablet at breakfast Albuterol (VENTOLIN HFA) 108 (90 Base) MCG/ACT: Inhale 2 puffs every 6 hours as needed Fluticasone (FLONASE) 50 MCG/ACT nasal spray: Place 2 sprays into both nostrils daily Clopidogrel (PLAVIX) 75 mg: one tablet at breakfast Aspirin EC 81 mg: one tablet at dinner  Patient declined the following medication last month due to PRN use. Nitroglycerin (NITROSTAT) 0.4 MG SL: Place 1 tablet (0.4 mg total) under the tongue every 5 (five) minutes as needed for chest pain  Patient is due for next adherence delivery on: 07.29.2022. Called patient and reviewed medications and coordinated delivery. This delivery to include: Losartan (COZAAR) 50 mg: one tablet with breakfast Venlafaxine XR (EFFEXOR-XR) 150 mg: one capsule by mouth with breakfast Pantoprazole (PROTONIX) 40 mg: one tablet at breakfast Atorvastatin (LIPITOR) 40 mg: one tablet at dinner Methimazole  (TAPAZOLE) 10 mg: two tablets at dinner Metoprolol succinate (TOPROL-XL) 25 MG 24 hr: one tablet at breakfast Albuterol (VENTOLIN HFA) 108 (90 Base) MCG/ACT: Inhale 2 puffs every 6 hours as needed Fluticasone (FLONASE) 50 MCG/ACT nasal spray: Place 2 sprays into both nostrils daily Clopidogrel (PLAVIX) 75 mg: one tablet at breakfast Aspirin EC 81 mg: one tablet at dinner  Patient declined the following medications due to PRN use. Nitroglycerin (NITROSTAT) 0.4 MG SL: Place 1 tablet (0.4 mg total) under the tongue every 5 (five) minutes as needed for chest pain  She currently does not need refill.  Confirmed delivery date of 07.29.2022, advised patient that pharmacy will contact them the morning of delivery.  Care Gaps: Zoster Vaccine Mammogram Colonoscopy COVID-19 2nd booster DTAP Dexa Scan  Star Rating Drugs: Medication Dispensed Quantity Pharmacy  Losartan 50 mg 06.27.2022 30 [facility]    [name], [place] Pharmacist Assistant [phone]

## 2020-09-20 ENCOUNTER — Other Ambulatory Visit: Payer: Self-pay | Admitting: Family Medicine

## 2020-09-25 ENCOUNTER — Other Ambulatory Visit: Payer: Self-pay | Admitting: Family Medicine

## 2020-09-27 ENCOUNTER — Telehealth: Payer: Medicare Other | Admitting: Family Medicine

## 2020-09-27 ENCOUNTER — Other Ambulatory Visit: Payer: Self-pay

## 2020-09-29 ENCOUNTER — Telehealth: Payer: Self-pay | Admitting: Pharmacist

## 2020-09-29 ENCOUNTER — Other Ambulatory Visit: Payer: Self-pay

## 2020-09-29 ENCOUNTER — Ambulatory Visit (INDEPENDENT_AMBULATORY_CARE_PROVIDER_SITE_OTHER): Payer: Medicare Other | Admitting: Family Medicine

## 2020-09-29 VITALS — BP 120/62 | HR 75 | Temp 97.7°F | Wt 166.9 lb

## 2020-09-29 DIAGNOSIS — M542 Cervicalgia: Secondary | ICD-10-CM | POA: Diagnosis not present

## 2020-09-29 DIAGNOSIS — H9201 Otalgia, right ear: Secondary | ICD-10-CM | POA: Diagnosis not present

## 2020-09-29 DIAGNOSIS — G8929 Other chronic pain: Secondary | ICD-10-CM

## 2020-09-29 DIAGNOSIS — F1910 Other psychoactive substance abuse, uncomplicated: Secondary | ICD-10-CM

## 2020-09-29 NOTE — Patient Instructions (Addendum)
Consider trial of Voltaren gel to apply to neck 2-3 times daily as needed.   [facility] [phone]

## 2020-09-29 NOTE — Progress Notes (Signed)
Established Patient Office Visit  Subjective:  Patient ID: Laurie [name], female    DOB: November 25, 1955  Age: 65 y.o. MRN: [id number]  CC: No chief complaint on file.   HPI [name] D Menter presents for complaint of some neck pain and right earache for the past 4 days or so.  She denies any nasal congestion.  No fever.  No ear drainage.  No sudden hearing change.  Her pain seems to be confined mostly to the right ear.  No sore throat.  Her major complaint is neck pain mostly right-sided.  She has had previous chronic neck issues and actually had recent MRI cervical spine.  Denies any recent injury.  She has very sharp pain intermittently right side of neck. MRI of cervical spine on April 5 showed postsurgical changes from interbody fusion C3-4 and ACDF at C 5-6 with some mild left foraminal narrowing at C6-7.  No significant canal stenosis.  She states that she feels like she is "losing control" of things with her hands at times.  She has longstanding history of polysubstance abuse.  She states she has been using some intranasal fentanyl recently.  She states that she wants to be "detoxed ".  She is willing to at least contact facility to look at options.  Her live-in boyfriend apparently has similar abuse history.  She denies any alcohol use recently.  She denies any other illicit drug use recently.  Past Medical History:  Diagnosis Date   Allergy    Anxiety    Arthritis    RA   Asthma    Chronic back pain    COPD (chronic obstructive pulmonary disease) (HCC)    Depression    Esophageal stricture    GERD (gastroesophageal reflux disease)    Heart murmur    Hematemesis 09/10/2018   Hyperlipidemia    Hypertension    IBS (irritable bowel syndrome)    Interstitial cystitis    Neuromuscular disorder ([place])    fibromyalgia    Past Surgical History:  Procedure Laterality Date   ABDOMINAL HYSTERECTOMY     APPENDECTOMY     BIOPSY  09/11/2018   Procedure: BIOPSY;  Surgeon:  Irving Copas., MD;  Location: [facility];  Service: Gastroenterology;;   CERVICAL FUSION     CERVICAL LAMINECTOMY     CORONARY STENT INTERVENTION N/A 10/07/2019   Procedure: CORONARY STENT INTERVENTION;  Surgeon: Burnell Blanks, MD;  Location: [facility];  Service: Cardiovascular;  Laterality: N/A;   ESOPHAGOGASTRODUODENOSCOPY     ESOPHAGOGASTRODUODENOSCOPY (EGD) WITH PROPOFOL N/A 09/11/2018   Procedure: ESOPHAGOGASTRODUODENOSCOPY (EGD) WITH PROPOFOL;  Surgeon: Rush Landmark Telford Nab., MD;  Location: [facility];  Service: Gastroenterology;  Laterality: N/A;   FINGER SURGERY     LEFT HEART CATH AND CORONARY ANGIOGRAPHY N/A 10/07/2019   Procedure: LEFT HEART CATH AND CORONARY ANGIOGRAPHY;  Surgeon: Burnell Blanks, MD;  Location: [facility];  Service: Cardiovascular;  Laterality: N/A;   LUMBAR FUSION     SAVORY DILATION N/A 09/11/2018   Procedure: SAVORY DILATION;  Surgeon: Rush Landmark Telford Nab., MD;  Location: [facility];  Service: Gastroenterology;  Laterality: N/A;   TONSILLECTOMY      Family History  Problem Relation Age of Onset   Dementia Mother    Heart attack Father    Coronary artery disease Father    Cancer Brother        esophageal   Esophageal cancer Brother    Coronary artery disease Paternal Aunt  Stomach cancer Paternal Aunt    Coronary artery disease Paternal Grandmother    Aneurysm Brother        aortic   Rectal cancer Neg Hx    Colon cancer Neg Hx    Thyroid disease Neg Hx     Social History   Socioeconomic History   Marital status: Single    Spouse name: Not on file   Number of children: Not on file   Years of education: Not on file   Highest education level: Not on file  Occupational History   Occupation: disability  Tobacco Use   Smoking status: Every Day    Packs/day: 0.50    Types: Cigarettes   Smokeless tobacco: Never   Tobacco comments:    trying to quit   Vaping Use   Vaping Use: Never used   Substance and Sexual Activity   Alcohol use: No    Alcohol/week: 0.0 standard drinks   Drug use: No   Sexual activity: Not on file  Other Topics Concern   Not on file  Social History Narrative   Not on file   Social Determinants of Health   Financial Resource Strain: Low Risk    Difficulty of Paying Living Expenses: Not very hard  Food Insecurity: Not on file  Transportation Needs: No Transportation Needs   Lack of Transportation (Medical): No   Lack of Transportation (Non-Medical): No  Physical Activity: Not on file  Stress: Not on file  Social Connections: Not on file  Intimate Partner Violence: Not on file    Outpatient Medications Prior to Visit  Medication Sig Dispense Refill   albuterol (VENTOLIN HFA) 108 (90 Base) MCG/ACT inhaler Inhale 2 puffs into the lungs every 6 (six) hours as needed for wheezing or shortness of breath. 8.5 g 0   aspirin EC 81 MG tablet Take 1 tablet (81 mg total) by mouth daily. Swallow whole. 90 tablet 3   atorvastatin (LIPITOR) 40 MG tablet Take 1 tablet (40 mg total) by mouth daily. 90 tablet 3   Blood Pressure Monitoring DEVI Use as needed DX I10 1 each 1   clopidogrel (PLAVIX) 75 MG tablet Take 4 tablets the first day then one tablet by mouth daily 90 tablet 3   fluticasone (FLONASE) 50 MCG/ACT nasal spray INSTILL 2 SPRAYS IN EACH NOSTRIL ONCE DAILY 16 g 6   guaiFENesin (MUCINEX) 600 MG 12 hr tablet Take 600 mg by mouth 2 (two) times daily as needed.     losartan (COZAAR) 50 MG tablet Take 1 tablet (50 mg total) by mouth daily. 90 tablet 3   Menthol, Topical Analgesic, (BIOFREEZE EX) Apply 1 application topically daily as needed (back pain).     methimazole (TAPAZOLE) 10 MG tablet Take 2 tablets (20 mg total) by mouth daily. 180 tablet 1   metoprolol succinate (TOPROL-XL) 50 MG 24 hr tablet Take 1 tablet (50 mg total) by mouth daily. Take with or immediately following a meal. 90 tablet 3   mupirocin ointment (BACTROBAN) 2 % Apply 1 application  topically 2 (two) times daily. 22 g 0   ondansetron (ZOFRAN ODT) 8 MG disintegrating tablet Take 1 tablet (8 mg total) by mouth every 8 (eight) hours as needed for nausea or vomiting. 15 tablet 0   pantoprazole (PROTONIX) 40 MG tablet TAKE ONE TABLET BY MOUTH EVERY MORNING 90 tablet 0   venlafaxine XR (EFFEXOR-XR) 150 MG 24 hr capsule TAKE ONE CAPSULE BY MOUTH EVERY MORNING 30 capsule 1   amoxicillin-clavulanate (  AUGMENTIN) 875-125 MG tablet Take 1 tablet by mouth 2 (two) times daily. 20 tablet 0   nitroGLYCERIN (NITROSTAT) 0.4 MG SL tablet Place 1 tablet (0.4 mg total) under the tongue every 5 (five) minutes as needed for chest pain. 25 tablet 1   No facility-administered medications prior to visit.    Allergies  Allergen Reactions   Codeine Nausea And Vomiting   Lisinopril Cough   Sulfonamide Derivatives Nausea And Vomiting   Tetracyclines & Related Rash    ROS Review of Systems  Constitutional:  Negative for chills and fever.  HENT:  Positive for ear pain.   Respiratory:  Negative for cough and shortness of breath.   Cardiovascular:  Negative for chest pain.  Musculoskeletal:  Positive for neck pain.     Objective:    Physical Exam Vitals reviewed.  Constitutional:      Appearance: Normal appearance.  HENT:     Right Ear: Tympanic membrane and ear canal normal.     Left Ear: Tympanic membrane and ear canal normal.  Cardiovascular:     Rate and Rhythm: Normal rate and regular rhythm.  Pulmonary:     Effort: Pulmonary effort is normal.     Breath sounds: Normal breath sounds.  Neurological:     Mental Status: She is alert.     Comments: She has good grip strength bilaterally.  Symmetric upper extremity reflexes.    BP 120/62 (BP Location: Left Arm, Patient Position: Sitting, Cuff Size: Normal)   Pulse 75   Temp 97.7 F (36.5 C) (Oral)   Wt 166 lb 14.4 oz (75.7 kg)   SpO2 93%   BMI 23.95 kg/m  Wt Readings from Last 3 Encounters:  09/29/20 166 lb 14.4 oz (75.7  kg)  05/01/20 160 lb (72.6 kg)  04/26/20 165 lb 6.4 oz (75 kg)     Health Maintenance Due  Topic Date Due   Zoster Vaccines- Shingrix (1 of 2) Never done   MAMMOGRAM  10/26/2010   COLONOSCOPY (Pts 45-62yr Insurance coverage will need to be confirmed)  09/11/2018   COVID-19 Vaccine (2 - Booster for Janssen series) 08/07/2019   TETANUS/TDAP  10/26/2019   DEXA SCAN  Never done   PNA vac Low Risk Adult (1 of 2 - PCV13) Never done   INFLUENZA VACCINE  09/11/2020    There are no preventive care reminders to display for this patient.  Lab Results  Component Value Date   TSH 0.04 (L) 04/26/2020   Lab Results  Component Value Date   WBC 6.9 02/23/2020   HGB 11.9 (L) 02/23/2020   HCT 35.3 (L) 02/23/2020   MCV 81.2 02/23/2020   PLT 206.0 02/23/2020   Lab Results  Component Value Date   NA 136 02/23/2020   K 4.2 02/23/2020   CO2 27 02/23/2020   GLUCOSE 122 (H) 02/23/2020   BUN 20 02/23/2020   CREATININE 0.88 02/23/2020   BILITOT 0.3 02/23/2020   ALKPHOS 77 02/23/2020   AST 12 02/23/2020   ALT 14 02/23/2020   PROT 6.7 02/23/2020   ALBUMIN 3.7 02/23/2020   CALCIUM 9.3 02/23/2020   ANIONGAP 9 08/30/2019   GFR 69.04 02/23/2020   Lab Results  Component Value Date   CHOL 159 09/03/2019   Lab Results  Component Value Date   HDL 46 (L) 09/03/2019   Lab Results  Component Value Date   LDLCALC 81 09/03/2019   Lab Results  Component Value Date   TRIG 227 (H) 09/03/2019  Lab Results  Component Value Date   CHOLHDL 3.5 09/03/2019   Lab Results  Component Value Date   HGBA1C 5.4 09/13/2018      Assessment & Plan:   #1 right otalgia.  Totally normal ear exam.  Etiology unclear.  May have some right eustachian tube dysfunction. -Reassurance and observation  #2 chronic right neck pain.  With her history of polysubstance abuse and fentanyl abuse cannot use opioids.  She is not a good candidate for nonsteroidals orally.  She has history of CAD.  We did discuss  possible topical use of Voltaren gel.  Patient had questions regarding diazepam which she had gotten in the past from neurosurgeon.  We explained our reluctance especially with her history of opioid abuse and risk of respiratory suppression with concomitant use.  #3 history of opioid and polysubstance abuse.  We strongly encouraged her to consider detox.  She was given name and number for local facility which is [facility].  She will consider contacting them.  No orders of the defined types were placed in this encounter.   Follow-up: No follow-ups on file.    [name], MD

## 2020-09-29 NOTE — Progress Notes (Signed)
Chronic Care Management Pharmacy Assistant   Name: Laurie [name]  MRN: [id number] DOB: [birth year]  Reason for Encounter: Medication Review Medication Coordination Call     Recent office visits:  None  Recent consult visits:  None  Hospital visits:  None in previous 6 months  Medications: Outpatient Encounter Medications as of 09/29/2020  Medication Sig   albuterol (VENTOLIN HFA) 108 (90 Base) MCG/ACT inhaler Inhale 2 puffs into the lungs every 6 (six) hours as needed for wheezing or shortness of breath.   amoxicillin-clavulanate (AUGMENTIN) 875-125 MG tablet Take 1 tablet by mouth 2 (two) times daily.   aspirin EC 81 MG tablet Take 1 tablet (81 mg total) by mouth daily. Swallow whole.   atorvastatin (LIPITOR) 40 MG tablet Take 1 tablet (40 mg total) by mouth daily.   Blood Pressure Monitoring DEVI Use as needed DX I10   clopidogrel (PLAVIX) 75 MG tablet Take 4 tablets the first day then one tablet by mouth daily   fluticasone (FLONASE) 50 MCG/ACT nasal spray INSTILL 2 SPRAYS IN EACH NOSTRIL ONCE DAILY   guaiFENesin (MUCINEX) 600 MG 12 hr tablet Take 600 mg by mouth 2 (two) times daily as needed.   losartan (COZAAR) 50 MG tablet Take 1 tablet (50 mg total) by mouth daily.   Menthol, Topical Analgesic, (BIOFREEZE EX) Apply 1 application topically daily as needed (back pain).   methimazole (TAPAZOLE) 10 MG tablet Take 2 tablets (20 mg total) by mouth daily.   metoprolol succinate (TOPROL-XL) 50 MG 24 hr tablet Take 1 tablet (50 mg total) by mouth daily. Take with or immediately following a meal.   mupirocin ointment (BACTROBAN) 2 % Apply 1 application topically 2 (two) times daily.   nitroGLYCERIN (NITROSTAT) 0.4 MG SL tablet Place 1 tablet (0.4 mg total) under the tongue every 5 (five) minutes as needed for chest pain.   ondansetron (ZOFRAN ODT) 8 MG disintegrating tablet Take 1 tablet (8 mg total) by mouth every 8 (eight) hours as needed for nausea or vomiting.   pantoprazole  (PROTONIX) 40 MG tablet TAKE ONE TABLET BY MOUTH EVERY MORNING   venlafaxine XR (EFFEXOR-XR) 150 MG 24 hr capsule TAKE ONE CAPSULE BY MOUTH EVERY MORNING   No facility-administered encounter medications on file as of 09/29/2020.  Reviewed chart for medication changes ahead of medication coordination call.  No OVs, Consults, or hospital visits since last care coordination call/Pharmacist visit.   No medication changes indicated   BP Readings from Last 3 Encounters:  05/01/20 120/74  04/26/20 (!) 150/70  03/13/20 (!) 176/60    Lab Results  Component Value Date   HGBA1C 5.4 09/13/2018     Patient obtains medications through Adherence Packaging  30 Days   Last adherence delivery on 09-08-20 included:  Losartan (COZAAR) 50 mg: one tablet with breakfast Venlafaxine XR (EFFEXOR-XR) 150 mg: one capsule by mouth with breakfast Pantoprazole (PROTONIX) 40 mg: one tablet at breakfast Atorvastatin (LIPITOR) 40 mg: one tablet at dinner Methimazole (TAPAZOLE) 10 mg: two tablets at dinner Metoprolol succinate (TOPROL-XL) 25 MG 24 hr: one tablet at breakfast Albuterol (VENTOLIN HFA) 108 (90 Base) MCG/ACT: Inhale 2 puffs every 6 hours as needed Fluticasone (FLONASE) 50 MCG/ACT nasal spray: Place 2 sprays into both nostrils daily Clopidogrel (PLAVIX) 75 mg: one tablet at breakfast Aspirin EC 81 mg: one tablet at dinner   Patient declined the following medications due to PRN use. Nitroglycerin (NITROSTAT) 0.4 MG SL: Place 1 tablet (0.4 mg total) under the tongue every  5 (five) minutes as needed for chest pain   Patient is due for next adherence delivery on: 10-09-2020. Called patient and reviewed medications and coordinated delivery.  This delivery to include: Fluticasone (FLONASE) 50 MCG/ACT nasal spray: Place 2 sprays into both nostrils daily Pantoprazole (PROTONIX) 40 mg: one tablet at breakfast Venlafaxine XR (EFFEXOR-XR) 150 mg: one capsule by mouth with breakfast Clopidogrel (PLAVIX) 75  mg: one tablet at breakfast Atorvastatin (LIPITOR) 40 mg: one tablet at dinner Aspirin EC 81 mg: one tablet at dinner  Metoprolol succinate (TOPROL-XL) 25 MG 24 hr: one tablet at breakfast Losartan (COZAAR) 50 mg: one tablet with breakfast Methimazole (TAPAZOLE) 10 mg: two tablets at dinner Confirmed that she is using Packaging for 30 days  Patient declined the following medications due to on hand supply has an extra box counted out 18 days worth of packs (taking 2 daily) and still has another box. Patient asked that I check back in with her on the 14th as she gets paid then and will not be able to pay for her med's prior has enough  plus some till then. Made Clinical Pharmacist and Pharmacy aware.. will check in with her in a few weeks to recount and coordinate delivery   Fluticasone (FLONASE) 50 MCG/ACT nasal spray: Place 2 sprays into both nostrils daily Pantoprazole (PROTONIX) 40 mg: one tablet at breakfast Venlafaxine XR (EFFEXOR-XR) 150 mg: one capsule by mouth with breakfast Clopidogrel (PLAVIX) 75 mg: one tablet at breakfast Atorvastatin (LIPITOR) 40 mg: one tablet at dinner Aspirin EC 81 mg: one tablet at dinner  Metoprolol succinate (TOPROL-XL) 25 MG 24 hr: one tablet at breakfast Losartan (COZAAR) 50 mg: one tablet with breakfast Methimazole (TAPAZOLE) 10 mg: two tablets at dinner      Care Gaps: Zoster Vaccine- Overdue Mammogram- Overdue Colonoscopy- Overdue COVID Booster #2- Overdue TDAP - Overdue Dexa Scan AWV- Scheduled for 10-06-2020 CCM F/U Call - 11-02-20 at 3 pm  Star Rating Drugs: Losartan (Cozaar) 50 mg - Last filled 09-05-2020 30 DS at [facility] Atorvastatin (Lipitor) 40 mg - Last filled 09-05-2020 30 DS at [facility]

## 2020-10-25 ENCOUNTER — Other Ambulatory Visit: Payer: Self-pay | Admitting: Endocrinology

## 2020-10-25 ENCOUNTER — Telehealth: Payer: Self-pay | Admitting: Pharmacist

## 2020-10-25 NOTE — Chronic Care Management (AMB) (Signed)
    Chronic Care Management Pharmacy Assistant   Name: Laurie [name]  MRN: [id number] DOB: Jun 14, 1955  Reason for Encounter: Check in with patient per prior note last month to see if she is needing her medications as of yet since she declined last delivery to be delivered.   10-25-2020 Call to patient mailbox full unable to leave msg on cell. Call to home number that is listed no machine picked up, will try her at a later time  10-27-2020 - 3rd attempt to reach patient unable to leave msg no ans will make pharmacy aware of attempts to reach  Medications: Outpatient Encounter Medications as of 10/25/2020  Medication Sig   albuterol (VENTOLIN HFA) 108 (90 Base) MCG/ACT inhaler Inhale 2 puffs into the lungs every 6 (six) hours as needed for wheezing or shortness of breath.   aspirin EC 81 MG tablet Take 1 tablet (81 mg total) by mouth daily. Swallow whole.   atorvastatin (LIPITOR) 40 MG tablet Take 1 tablet (40 mg total) by mouth daily.   Blood Pressure Monitoring DEVI Use as needed DX I10   clopidogrel (PLAVIX) 75 MG tablet Take 4 tablets the first day then one tablet by mouth daily   fluticasone (FLONASE) 50 MCG/ACT nasal spray INSTILL 2 SPRAYS IN EACH NOSTRIL ONCE DAILY   guaiFENesin (MUCINEX) 600 MG 12 hr tablet Take 600 mg by mouth 2 (two) times daily as needed.   losartan (COZAAR) 50 MG tablet Take 1 tablet (50 mg total) by mouth daily.   Menthol, Topical Analgesic, (BIOFREEZE EX) Apply 1 application topically daily as needed (back pain).   methimazole (TAPAZOLE) 10 MG tablet Take 2 tablets (20 mg total) by mouth daily.   metoprolol succinate (TOPROL-XL) 50 MG 24 hr tablet Take 1 tablet (50 mg total) by mouth daily. Take with or immediately following a meal.   mupirocin ointment (BACTROBAN) 2 % Apply 1 application topically 2 (two) times daily.   nitroGLYCERIN (NITROSTAT) 0.4 MG SL tablet Place 1 tablet (0.4 mg total) under the tongue every 5 (five) minutes as needed for chest pain.    ondansetron (ZOFRAN ODT) 8 MG disintegrating tablet Take 1 tablet (8 mg total) by mouth every 8 (eight) hours as needed for nausea or vomiting.   pantoprazole (PROTONIX) 40 MG tablet TAKE ONE TABLET BY MOUTH EVERY MORNING   venlafaxine XR (EFFEXOR-XR) 150 MG 24 hr capsule TAKE ONE CAPSULE BY MOUTH EVERY MORNING   No facility-administered encounter medications on file as of 10/25/2020.    Care Gaps: Zoster Vaccine- Overdue Mammogram- Overdue Colonoscopy- Overdue COVID Booster #2- Overdue TDAP - Overdue Dexa Scan AWV- Done 10-06-2020 CCM F/U Call - 11-02-20 at 3 pm  Star Rating Drugs: Losartan (Cozaar) 50 mg - Last filled 09-05-2020 30 DS at [facility] (declined last delivery) Atorvastatin (Lipitor) 40 mg - Last filled 09-05-2020 30 DS at [facility] (declined last delivery)  [place] Pharmacist Assistant [phone]

## 2020-10-30 ENCOUNTER — Telehealth: Payer: Self-pay | Admitting: Pharmacist

## 2020-10-30 NOTE — Progress Notes (Signed)
Chronic Care Management Pharmacy Assistant   Name: Laurie [name]  MRN: [id number] DOB: 28-Jun-1955    Reason for Encounter: Medication Review / Medication Coordination Call     Recent office visits:  09-29-2020 Laurie Post, MD - Patient presented for Acute otalgia and other concerns. Stopped AUGMENTIN 875-125 MG.  Recent consult visits:  None  Hospital visits:  None in previous 6 months  Medications: Outpatient Encounter Medications as of 10/30/2020  Medication Sig   albuterol (VENTOLIN HFA) 108 (90 Base) MCG/ACT inhaler Inhale 2 puffs into the lungs every 6 (six) hours as needed for wheezing or shortness of breath.   aspirin EC 81 MG tablet Take 1 tablet (81 mg total) by mouth daily. Swallow whole.   atorvastatin (LIPITOR) 40 MG tablet Take 1 tablet (40 mg total) by mouth daily.   Blood Pressure Monitoring DEVI Use as needed DX I10   clopidogrel (PLAVIX) 75 MG tablet Take 4 tablets the first day then one tablet by mouth daily   fluticasone (FLONASE) 50 MCG/ACT nasal spray INSTILL 2 SPRAYS IN EACH NOSTRIL ONCE DAILY   guaiFENesin (MUCINEX) 600 MG 12 hr tablet Take 600 mg by mouth 2 (two) times daily as needed.   losartan (COZAAR) 50 MG tablet Take 1 tablet (50 mg total) by mouth daily.   Menthol, Topical Analgesic, (BIOFREEZE EX) Apply 1 application topically daily as needed (back pain).   methimazole (TAPAZOLE) 10 MG tablet TAKE TWO TABLETS BY MOUTH EVERY EVENING   metoprolol succinate (TOPROL-XL) 50 MG 24 hr tablet Take 1 tablet (50 mg total) by mouth daily. Take with or immediately following a meal.   mupirocin ointment (BACTROBAN) 2 % Apply 1 application topically 2 (two) times daily.   nitroGLYCERIN (NITROSTAT) 0.4 MG SL tablet Place 1 tablet (0.4 mg total) under the tongue every 5 (five) minutes as needed for chest pain.   ondansetron (ZOFRAN ODT) 8 MG disintegrating tablet Take 1 tablet (8 mg total) by mouth every 8 (eight) hours as needed for nausea or  vomiting.   pantoprazole (PROTONIX) 40 MG tablet TAKE ONE TABLET BY MOUTH EVERY MORNING   venlafaxine XR (EFFEXOR-XR) 150 MG 24 hr capsule TAKE ONE CAPSULE BY MOUTH EVERY MORNING   No facility-administered encounter medications on file as of 10/30/2020.  Reviewed chart for medication changes ahead of medication coordination call.  No OVs, Consults, or hospital visits since last care coordination call/Pharmacist visit. (If appropriate, list visit date, provider name)  No medication changes indicated OR if recent visit, treatment plan here.  BP Readings from Last 3 Encounters:  09/29/20 120/62  05/01/20 120/74  04/26/20 (!) 150/70    Lab Results  Component Value Date   HGBA1C 5.4 09/13/2018     Patient obtains medications through Adherence Packaging  30 Days   Last adherence delivery included: Patient declined delivery of all med's last month  Patient declined (meds) last month due to additional supply on hand.   Patient is due for next adherence delivery on: 11-08-2020. Called patient and reviewed medications and coordinated delivery. Confirmed Packages for 30 DS     This delivery to include: Fluticasone (FLONASE) 50 MCG/ACT nasal spray: Place 2 sprays into both nostrils daily Pantoprazole (PROTONIX) 40 mg: one tablet at breakfast Venlafaxine XR (EFFEXOR-XR) 150 mg: one capsule by mouth with breakfast Clopidogrel (PLAVIX) 75 mg: one tablet at breakfast Atorvastatin (LIPITOR) 40 mg: one tablet at dinner Aspirin EC 81 mg: one tablet at dinner  Metoprolol succinate (TOPROL-XL) 25 MG  24 hr: one tablet at breakfast Losartan (COZAAR) 50 mg: one tablet with breakfast Methimazole (TAPAZOLE) 10 mg: two tablets at dinner Albuterol HFA : 2 puffs every 6 hours as needed  Confirmed delivery date of 11-08-2020, advised patient that pharmacy will contact them the morning of delivery.   Notes:  Attempted to reach patient left SMS as voice mail is full for call back and other numbers are  not functional.   11-01-2020 Attempted to reach several times today, mail box full and unable to leave message  11-03-2020 Attempted to reach left vm  11-07-2020 Was unable to speak with patient, left message to advise the pharmacy will attempt to deliver on tomorrow   Care Gaps: Zoster Vaccine- Overdue Mammogram- Overdue Colonoscopy- Overdue COVID Booster #2- Overdue TDAP - Overdue Dexa Scan AWV- Done 10-06-2020 CCM F/U Call - 11-02-20 at 3 pm   Star Rating Drugs: Losartan (Cozaar) 50 mg - Last filled 09-05-2020 30 DS at [facility] (declined last delivery) Atorvastatin (Lipitor) 40 mg - Last filled 09-05-2020 30 DS at [facility] (declined last delivery  Page Pharmacist Assistant [phone]

## 2020-11-02 ENCOUNTER — Telehealth: Payer: Medicare Other

## 2020-11-02 ENCOUNTER — Telehealth: Payer: Self-pay | Admitting: Pharmacist

## 2020-11-02 NOTE — Telephone Encounter (Signed)
  Chronic Care Management   Outreach Note  11/02/2020 Name: Laurie [name] MRN: [id number] DOB: [birth year]  Referred by: [name], MD  Patient had a phone appointment scheduled with clinical pharmacist today.  An unsuccessful telephone outreach was attempted today. The patient was referred to the pharmacist for assistance with care management and care coordination.   If possible, a message was left to return call to: [phone] or to [facility]: [place], PharmD, [place] [place]

## 2020-11-02 NOTE — Progress Notes (Deleted)
Chronic Care Management Pharmacy Note  11/02/2020 Name:  Laurie [name] MRN:  [id number] DOB:  Nov 11, 1955  Summary: Patient has been under a lot of stress and also continues to have neck pain  Recommendations/Changes made from today's visit: -Recommended setting up a visit with behavioral health and provided phone number to call [phone]). -Recommended follow up with PCP for neck pain and mood -Recommended setting a timer for medications and following a routine to avoid missed doses -Consider dose increase for venlafaxine based on current mood  Plan: Follow up with PCP ASAP Follow up in 3 months  Subjective: Laurie [name] is an 65 y.o. year old female who is a primary patient of Burchette, Alinda Sierras, MD.  The CCM team was consulted for assistance with disease management and care coordination needs.    Engaged with patient by telephone for follow up visit in response to provider referral for pharmacy case management and/or care coordination services.   Consent to Services:  The patient was given information about Chronic Care Management services, agreed to services, and gave verbal consent prior to initiation of services.  Please see initial visit note for detailed documentation.   Patient Care Team: [name], MD as PCP - General (Family Medicine) Burnell Blanks, MD as PCP - Cardiology (Cardiology) Viona Gilmore, [facility] as Pharmacist (Pharmacist)  Recent office visits: 09-29-2020 [name], MD - Patient presented for Acute otalgia and other concerns. Removed AUGMENTIN 875-125 MG. Recommended trial of voltaren gel for neck pain.  06/07/20 [name], MD: Patient presented for video visit for sinusitis. Prescribed Augmentin x 10 days and Zofran PRN.  05/01/20 [name], MD: Patient presented for neck pain. Prescribed Augmentin x 10 days and recommended OTC Aleve.  02/23/20 Grier Mitts, MD: Patient presented for office visit for  dysuria. Urine culture was negative for growth.  01/05/20 [name], MD: Patient presented for video visit for sinusitis. Prescribed albuterol inhaler PRN.  Recent consult visits: 04/26/20 Renato Shin, MD (endo): Patient presented for hyperthyroidism follow up. Decreased methimazole to 2 tablets once daily. Placed referral for ophthalmology.  03/13/20 Renato Shin, MD (endo): Patient presented for hyperthyroidism. TSH still low but T4 is normal. Increased metoprolol to 50 mg daily.   02/28/20 Lauree Chandler, MD (cardiology): Patient presented for CAD follow up.   02/17/20 Renato Shin, MD (endo): Patient presented for hyperthyroidism initial visit. Prescribed methimazole 20 mg BID and metoprolol succinate 25 mg daily.   Hospital visits: None in previous 6 months  Objective:  Lab Results  Component Value Date   CREATININE 0.88 02/23/2020   BUN 20 02/23/2020   GFR 69.04 02/23/2020   GFRNONAA 66 09/27/2019   GFRAA 76 09/27/2019   NA 136 02/23/2020   K 4.2 02/23/2020   CALCIUM 9.3 02/23/2020   CO2 27 02/23/2020    Lab Results  Component Value Date/Time   HGBA1C 5.4 09/13/2018 03:11 AM   GFR 69.04 02/23/2020 05:01 PM   GFR 57.02 (L) 07/23/2019 02:41 PM    Last diabetic Eye exam: No results found for: HMDIABEYEEXA  Last diabetic Foot exam: No results found for: HMDIABFOOTEX   Lab Results  Component Value Date   CHOL 159 09/03/2019   HDL 46 (L) 09/03/2019   LDLCALC 81 09/03/2019   LDLDIRECT 123.0 03/14/2017   TRIG 227 (H) 09/03/2019   CHOLHDL 3.5 09/03/2019    Hepatic Function Latest Ref Rng & Units 02/23/2020 09/03/2019 07/23/2019  Total Protein 6.0 - 8.3 g/dL 6.7 6.9  7.0  Albumin 3.5 - 5.2 g/dL 3.7 - 4.2  AST 0 - 37 U/L 12 9(L) 11  ALT 0 - 35 U/L 14 14 13   Alk Phosphatase 39 - 117 U/L 77 - 76  Total Bilirubin 0.2 - 1.2 mg/dL 0.3 0.5 0.3  Bilirubin, Direct 0.0 - 0.2 mg/dL - 0.1 -    Lab Results  Component Value Date/Time   TSH 0.04 (L) 04/26/2020 09:49 AM    TSH <0.01 Repeated and verified X2. (L) 03/13/2020 01:30 PM   FREET4 0.69 04/26/2020 09:49 AM   FREET4 1.56 03/13/2020 01:30 PM    CBC Latest Ref Rng & Units 02/23/2020 09/27/2019 08/30/2019  WBC 4.0 - 10.5 K/uL 6.9 10.1 10.4  Hemoglobin 12.0 - 15.0 g/dL 11.9(L) 13.1 11.9(L)  Hematocrit 36.0 - 46.0 % 35.3(L) 39.7 37.5  Platelets 150.0 - 400.0 K/uL 206.0 283 176    Lab Results  Component Value Date/Time   VD25OH 38.94 03/14/2017 09:48 AM   VD25OH 16.55 (L) 08/18/2015 08:36 AM    Clinical ASCVD: Yes  The 10-year ASCVD risk score (Arnett DK, et al., 2019) is: 11.4%   Values used to calculate the score:     Age: 33 years     Sex: Female     Is Non-Hispanic African American: No     Diabetic: No     Tobacco smoker: Yes     Systolic Blood Pressure: 161 mmHg     Is BP treated: Yes     HDL Cholesterol: 46 mg/dL     Total Cholesterol: 159 mg/dL    Depression screen PHQ 2/9 11/24/2019  Decreased Interest 3  Down, Depressed, Hopeless 2  PHQ - 2 Score 5  Altered sleeping 3  Tired, decreased energy 3  Change in appetite 1  Feeling bad or failure about yourself  2  Trouble concentrating 2  Moving slowly or fidgety/restless 2  Suicidal thoughts 1  PHQ-9 Score 19  Some recent data might be hidden     Social History   Tobacco Use  Smoking Status Every Day   Packs/day: 0.50   Types: Cigarettes  Smokeless Tobacco Never  Tobacco Comments   trying to quit    BP Readings from Last 3 Encounters:  09/29/20 120/62  05/01/20 120/74  04/26/20 (!) 150/70   Pulse Readings from Last 3 Encounters:  09/29/20 75  05/01/20 68  04/26/20 75   Wt Readings from Last 3 Encounters:  09/29/20 166 lb 14.4 oz (75.7 kg)  05/01/20 160 lb (72.6 kg)  04/26/20 165 lb 6.4 oz (75 kg)    Assessment/Interventions: Review of patient past medical history, allergies, medications, health status, including review of consultants reports, laboratory and other test data, was performed as part of  comprehensive evaluation and provision of chronic care management services.   SDOH:  (Social Determinants of Health) assessments and interventions performed: No   CCM Care Plan  Allergies  Allergen Reactions   Codeine Nausea And Vomiting   Lisinopril Cough   Sulfonamide Derivatives Nausea And Vomiting   Tetracyclines & Related Rash    Medications Reviewed Today     Reviewed by Rebecca Eaton, Justin (Certified Medical Assistant) on 09/29/20 at 1428  Med List Status: <None>   Medication Order Taking? Sig Documenting Provider Last Dose Status Informant  albuterol (VENTOLIN HFA) 108 (90 Base) MCG/ACT inhaler [id number] Yes Inhale 2 puffs into the lungs every 6 (six) hours as needed for wheezing or shortness of breath. [name], MD Taking Active  amoxicillin-clavulanate (AUGMENTIN) 875-125 MG tablet [id number] Yes Take 1 tablet by mouth 2 (two) times daily. [name], MD Taking Active   aspirin EC 81 MG tablet [id number] Yes Take 1 tablet (81 mg total) by mouth daily. Swallow whole. Burnell Blanks, MD Taking Active Self  atorvastatin (LIPITOR) 40 MG tablet [id number] Yes Take 1 tablet (40 mg total) by mouth daily. Burnell Blanks, MD Taking Active   Blood Pressure Monitoring [place] [id number] Yes Use as needed DX I10 [name], MD Taking Active   clopidogrel (PLAVIX) 75 MG tablet [id number] Yes Take 4 tablets the first day then one tablet by mouth daily Burnell Blanks, MD Taking Active   fluticasone [facility]) 50 MCG/ACT nasal spray [id number] Yes INSTILL 2 SPRAYS IN EACH NOSTRIL ONCE DAILY Burchette, Alinda Sierras, MD Taking Active   guaiFENesin (MUCINEX) 600 MG 12 hr tablet [id number] Yes Take 600 mg by mouth 2 (two) times daily as needed. [provider] Taking Active   losartan (COZAAR) 50 MG tablet [id number] Yes Take 1 tablet (50 mg total) by mouth daily. [name], MD Taking Active   Menthol, Topical Analgesic, [facility] EX)  [id number] Yes Apply 1 application topically daily as needed (back pain). [provider] Taking Active Self  methimazole (TAPAZOLE) 10 MG tablet [id number] Yes Take 2 tablets (20 mg total) by mouth daily. Renato Shin, MD Taking Active   metoprolol succinate (TOPROL-XL) 50 MG 24 hr tablet [id number] Yes Take 1 tablet (50 mg total) by mouth daily. Take with or immediately following a meal. Renato Shin, MD Taking Active   mupirocin ointment (BACTROBAN) 2 % [id number] Yes Apply 1 application topically 2 (two) times daily. [name], MD Taking Active   nitroGLYCERIN (NITROSTAT) 0.4 MG SL tablet [id number]  Place 1 tablet (0.4 mg total) under the tongue every 5 (five) minutes as needed for chest pain. Furth, Cadence H, PA-C  Expired 01/17/20 2359   ondansetron (ZOFRAN ODT) 8 MG disintegrating tablet [id number] Yes Take 1 tablet (8 mg total) by mouth every 8 (eight) hours as needed for nausea or vomiting. [name], MD Taking Active   pantoprazole (PROTONIX) 40 MG tablet [id number] Yes TAKE ONE TABLET BY MOUTH EVERY MORNING [name], MD Taking Active   venlafaxine XR (EFFEXOR-XR) 150 MG 24 hr capsule [id number] Yes TAKE ONE CAPSULE BY MOUTH EVERY MORNING Burchette, Alinda Sierras, MD Taking Active             Patient Active Problem List   Diagnosis Date Noted   Eye swelling 04/27/2020   Hyperthyroidism 02/19/2020   Coronary artery disease involving native coronary artery of native heart with unstable angina pectoris ([place])    Substance abuse ([place]) 07/23/2019   Renal insufficiency 09/10/2018   Elevated troponin 09/10/2018   Leukocytosis 09/10/2018   Hematemesis 09/09/2018   Opiate withdrawal ([place]) 09/09/2018   Vitamin D deficiency 06/10/2018   Shortness of breath 11/05/2017   Elevated brain natriuretic peptide (BNP) level 11/05/2017   Polysubstance (excluding opioids) dependence, daily use ([place]) 11/05/2017   AKI (acute kidney injury) ([place]) 11/05/2017   Low serum  vitamin D 08/23/2015   Hyperlipidemia 11/08/2014   Reflux esophagitis 03/23/2014   Dysphagia, pharyngoesophageal phase 03/23/2014   Pain in the chest 03/23/2014   Special screening for malignant neoplasms, colon 07/09/2013   Chest pain 04/23/2012   Dyspnea 04/23/2012   Murmur 04/23/2012   INTERSTITIAL CYSTITIS 01/01/2010   SWELLING, NECK 01/01/2010   CAD, NATIVE VESSEL 12/19/2009  Palpitations 11/21/2009   ABNORMAL CV (STRESS) TEST 11/21/2009   Essential hypertension 10/31/2009   ELECTROCARDIOGRAM, ABNORMAL 10/25/2009   PLANTAR FASCIITIS, BILATERAL 10/12/2009   CERVICAL RADICULOPATHY 07/04/2008   Pain in soft tissues of limb 03/25/2008   DEGENERATIVE DISC DISEASE, CERVICAL SPINE, W/RADICULOPATHY 09/08/2007   LOW BACK PAIN 09/08/2007   TOBACCO USE 03/23/2007   OSTEOARTHROSIS, LOCAL NOS, LOWER LEG 11/07/2006   STATE, SYMPTOMATIC MENOPAUSE/FEM CLIMACTERIC 10/27/2006   Depression, recurrent ([place]) 09/15/2006   Thoracic or lumbosacral neuritis or radiculitis, unspecified 09/15/2006   IBS 09/09/2006   STRICTURE, ESOPHAGEAL 03/16/2001    Immunization History  Administered Date(s) Administered   Influenza Split 12/16/2011   Influenza Whole 11/15/2008, 10/25/2009   Influenza,inj,Quad PF,6+ Mos 11/18/2012, 11/29/2013, 11/08/2014, 03/14/2017   Janssen (J&J) SARS-COV-2 Vaccination 06/12/2019   Td 10/25/2009      Conditions to be addressed/monitored:  Hypertension, Hyperlipidemia, Coronary Artery Disease, Depression, Tobacco use, Allergic Rhinitis and chronic pain  There are no care plans that you recently modified to display for this patient.    Medication Assistance: None required.  Patient affirms current coverage meets needs.  Compliance/Adherence/Medication fill history: Care Gaps: Shingrix, tetanus, DEXA, mammogram, colonoscopy, COVID booster  Star-Rating Drugs: Atorvastatin 68m - last filled on 08/07/20 30DS at [facility] Losartan 510m- last filled on 08/07/20 30DS at  [facility]  [facility]:  [facility] [address] [place]one: [phone]: [phone] [id number] [place]: [phone]: [phone] pill box? No - adherence packaging Pt endorses 80% compliance - has not been taking clopidogrel x 2 weeks  We discussed: Benefits of medication synchronization, packaging and delivery as well as enhanced pharmacist oversight with [facility]. Patient decided to: Utilize [facility] pharmacy for medication synchronization, packaging and delivery  Care Plan and Follow Up Patient Decision:  Patient agrees to Care Plan and Follow-up.  Plan: Telephone follow up appointment with care management team member scheduled for:   3 months  MaJeni SallesPharmD BCSagadahocharmacist [phone]

## 2020-11-30 ENCOUNTER — Telehealth: Payer: Self-pay | Admitting: Pharmacist

## 2020-11-30 NOTE — Chronic Care Management (AMB) (Signed)
Chronic Care Management Pharmacy Assistant   Name: Laurie [name]  MRN: [id number] DOB: May 01, 1955  Reason for Encounter: Medication Review / Medication Coordination Call   Conditions to be addressed/monitored: HTN and GERD   Recent office visits:  None  Recent consult visits:  None  Hospital visits:  None in previous 6 months  Medications: Outpatient Encounter Medications as of 11/30/2020  Medication Sig   albuterol (VENTOLIN HFA) 108 (90 Base) MCG/ACT inhaler Inhale 2 puffs into the lungs every 6 (six) hours as needed for wheezing or shortness of breath.   aspirin EC 81 MG tablet Take 1 tablet (81 mg total) by mouth daily. Swallow whole.   atorvastatin (LIPITOR) 40 MG tablet Take 1 tablet (40 mg total) by mouth daily.   Blood Pressure Monitoring DEVI Use as needed DX I10   clopidogrel (PLAVIX) 75 MG tablet Take 4 tablets the first day then one tablet by mouth daily   fluticasone (FLONASE) 50 MCG/ACT nasal spray INSTILL 2 SPRAYS IN EACH NOSTRIL ONCE DAILY   guaiFENesin (MUCINEX) 600 MG 12 hr tablet Take 600 mg by mouth 2 (two) times daily as needed.   losartan (COZAAR) 50 MG tablet Take 1 tablet (50 mg total) by mouth daily.   Menthol, Topical Analgesic, (BIOFREEZE EX) Apply 1 application topically daily as needed (back pain).   methimazole (TAPAZOLE) 10 MG tablet TAKE TWO TABLETS BY MOUTH EVERY EVENING   metoprolol succinate (TOPROL-XL) 50 MG 24 hr tablet Take 1 tablet (50 mg total) by mouth daily. Take with or immediately following a meal.   mupirocin ointment (BACTROBAN) 2 % Apply 1 application topically 2 (two) times daily.   nitroGLYCERIN (NITROSTAT) 0.4 MG SL tablet Place 1 tablet (0.4 mg total) under the tongue every 5 (five) minutes as needed for chest pain.   ondansetron (ZOFRAN ODT) 8 MG disintegrating tablet Take 1 tablet (8 mg total) by mouth every 8 (eight) hours as needed for nausea or vomiting.   pantoprazole (PROTONIX) 40 MG tablet TAKE ONE TABLET BY MOUTH  EVERY MORNING   venlafaxine XR (EFFEXOR-XR) 150 MG 24 hr capsule TAKE ONE CAPSULE BY MOUTH EVERY MORNING   No facility-administered encounter medications on file as of 11/30/2020.  Reviewed chart for medication changes ahead of medication coordination call.  No OVs, Consults, or hospital visits since last care coordination call/Pharmacist visit.   No medication changes indicated OR if recent visit, treatment plan here.  BP Readings from Last 3 Encounters:  09/29/20 120/62  05/01/20 120/74  04/26/20 (!) 150/70    Lab Results  Component Value Date   HGBA1C 5.4 09/13/2018     Patient obtains medications through Adherence Packaging  30 Days   Last adherence delivery included:  Fluticasone (FLONASE) 50 MCG/ACT nasal spray: Place 2 sprays into both nostrils daily Pantoprazole (PROTONIX) 40 mg: one tablet at breakfast Venlafaxine XR (EFFEXOR-XR) 150 mg: one capsule by mouth with breakfast Clopidogrel (PLAVIX) 75 mg: one tablet at breakfast Atorvastatin (LIPITOR) 40 mg: one tablet at dinner Aspirin EC 81 mg: one tablet at dinner  Metoprolol succinate (TOPROL-XL) 25 MG 24 hr: one tablet at breakfast Losartan (COZAAR) 50 mg: one tablet with breakfast Methimazole (TAPAZOLE) 10 mg: two tablets at dinner Albuterol HFA : 2 puffs every 6 hours as needed  Patient declined no medications last month  Patient is due for next adherence delivery on: 12/13/2020.  Called patient and reviewed medications and coordinated delivery.  This delivery to include: Methimazole (TAPAZOLE) 10 mg: two tablets  at dinner Fluticasone (FLONASE) 50 MCG/ACT nasal spray: Place 2 sprays into both nostrils daily Pantoprazole (PROTONIX) 40 mg: one tablet at breakfast Venlafaxine XR (EFFEXOR-XR) 150 mg: one capsule by mouth with breakfast Clopidogrel (PLAVIX) 75 mg: one tablet at breakfast Atorvastatin (LIPITOR) 40 mg: one tablet at dinner Aspirin EC 81 mg: one tablet at dinner Metoprolol succinate (TOPROL-XL) 50  MG 24 hr: one tablet at breakfast Losartan (COZAAR) 50 mg: one tablet with breakfast Albuterol HFA : 2 puffs every 6 hours as needed   Patient will not need a short fill prior to adherence delivery.   Confirmed delivery date of 12/13/2020, advised patient that pharmacy will contact them the morning of delivery.   Care Gaps: AWV - completed 10/06/2020 Last BP - 120/62 on 09/29/2020 Zoster Vaccine- Overdue Mammogram- Overdue Colonoscopy- Overdue COVID Booster #2- Overdue TDAP - Overdue Shingrix - never done Dexa Scan - never done  Star Rating Drugs: Losartan (Cozaar) 50 mg - Last filled 11/15/2020 30 DS at [facility]  Atorvastatin (Lipitor) 40 mg - Last filled 11/15/2020 30 DS at [facility] [phone]

## 2020-12-05 ENCOUNTER — Other Ambulatory Visit: Payer: Self-pay | Admitting: Family Medicine

## 2020-12-06 ENCOUNTER — Other Ambulatory Visit: Payer: Self-pay | Admitting: Cardiovascular Disease

## 2020-12-20 ENCOUNTER — Ambulatory Visit (INDEPENDENT_AMBULATORY_CARE_PROVIDER_SITE_OTHER): Payer: Medicare Other

## 2020-12-20 ENCOUNTER — Other Ambulatory Visit: Payer: Self-pay

## 2020-12-20 DIAGNOSIS — Z23 Encounter for immunization: Secondary | ICD-10-CM

## 2020-12-29 ENCOUNTER — Other Ambulatory Visit: Payer: Self-pay | Admitting: Family Medicine

## 2020-12-30 ENCOUNTER — Encounter (HOSPITAL_BASED_OUTPATIENT_CLINIC_OR_DEPARTMENT_OTHER): Payer: Self-pay | Admitting: *Deleted

## 2020-12-30 ENCOUNTER — Emergency Department (HOSPITAL_BASED_OUTPATIENT_CLINIC_OR_DEPARTMENT_OTHER)
Admission: EM | Admit: 2020-12-30 | Discharge: 2020-12-30 | Disposition: A | Discharge status: Home / Self Care | Payer: Medicare Other | Attending: Student | Admitting: Student

## 2020-12-30 ENCOUNTER — Other Ambulatory Visit: Payer: Self-pay

## 2020-12-30 ENCOUNTER — Emergency Department (HOSPITAL_BASED_OUTPATIENT_CLINIC_OR_DEPARTMENT_OTHER): Payer: Medicare Other

## 2020-12-30 DIAGNOSIS — I10 Essential (primary) hypertension: Secondary | ICD-10-CM | POA: Insufficient documentation

## 2020-12-30 DIAGNOSIS — J45909 Unspecified asthma, uncomplicated: Secondary | ICD-10-CM | POA: Diagnosis not present

## 2020-12-30 DIAGNOSIS — M6281 Muscle weakness (generalized): Secondary | ICD-10-CM | POA: Diagnosis present

## 2020-12-30 DIAGNOSIS — F17210 Nicotine dependence, cigarettes, uncomplicated: Secondary | ICD-10-CM | POA: Diagnosis not present

## 2020-12-30 DIAGNOSIS — I2510 Atherosclerotic heart disease of native coronary artery without angina pectoris: Secondary | ICD-10-CM | POA: Diagnosis not present

## 2020-12-30 DIAGNOSIS — J449 Chronic obstructive pulmonary disease, unspecified: Secondary | ICD-10-CM | POA: Insufficient documentation

## 2020-12-30 DIAGNOSIS — R202 Paresthesia of skin: Secondary | ICD-10-CM | POA: Diagnosis not present

## 2020-12-30 DIAGNOSIS — Z79899 Other long term (current) drug therapy: Secondary | ICD-10-CM | POA: Insufficient documentation

## 2020-12-30 DIAGNOSIS — R200 Anesthesia of skin: Secondary | ICD-10-CM | POA: Diagnosis not present

## 2020-12-30 DIAGNOSIS — M21331 Wrist drop, right wrist: Secondary | ICD-10-CM | POA: Diagnosis not present

## 2020-12-30 DIAGNOSIS — Z7982 Long term (current) use of aspirin: Secondary | ICD-10-CM | POA: Diagnosis not present

## 2020-12-30 LAB — CBC WITH DIFFERENTIAL/PLATELET
Abs Immature Granulocytes: 0.06 10*3/uL (ref 0.00–0.07)
Basophils Absolute: 0.1 10*3/uL (ref 0.0–0.1)
Basophils Relative: 1 %
Eosinophils Absolute: 0.5 10*3/uL (ref 0.0–0.5)
Eosinophils Relative: 5 %
HCT: 39.9 % (ref 36.0–46.0)
Hemoglobin: 12.9 g/dL (ref 12.0–15.0)
Immature Granulocytes: 1 %
Lymphocytes Relative: 33 %
Lymphs Abs: 3.1 10*3/uL (ref 0.7–4.0)
MCH: 28.6 pg (ref 26.0–34.0)
MCHC: 32.3 g/dL (ref 30.0–36.0)
MCV: 88.5 fL (ref 80.0–100.0)
Monocytes Absolute: 0.8 10*3/uL (ref 0.1–1.0)
Monocytes Relative: 9 %
Neutro Abs: 5 10*3/uL (ref 1.7–7.7)
Neutrophils Relative %: 51 %
Platelets: 224 10*3/uL (ref 150–400)
RBC: 4.51 MIL/uL (ref 3.87–5.11)
RDW: 13 % (ref 11.5–15.5)
WBC: 9.5 10*3/uL (ref 4.0–10.5)
nRBC: 0 % (ref 0.0–0.2)

## 2020-12-30 LAB — COMPREHENSIVE METABOLIC PANEL
ALT: 12 U/L (ref 0–44)
AST: 12 U/L — ABNORMAL LOW (ref 15–41)
Albumin: 4.3 g/dL (ref 3.5–5.0)
Alkaline Phosphatase: 107 U/L (ref 38–126)
Anion gap: 5 (ref 5–15)
BUN: 20 mg/dL (ref 8–23)
CO2: 33 mmol/L — ABNORMAL HIGH (ref 22–32)
Calcium: 9.7 mg/dL (ref 8.9–10.3)
Chloride: 102 mmol/L (ref 98–111)
Creatinine, Ser: 0.92 mg/dL (ref 0.44–1.00)
GFR, Estimated: >60 mL/min (ref >60)
Glucose, Bld: 102 mg/dL — ABNORMAL HIGH (ref 70–99)
Potassium: 3.7 mmol/L (ref 3.5–5.1)
Sodium: 140 mmol/L (ref 135–145)
Total Bilirubin: 0.3 mg/dL (ref 0.3–1.2)
Total Protein: 7.4 g/dL (ref 6.5–8.1)

## 2020-12-30 MED ORDER — DEXAMETHASONE SODIUM PHOSPHATE 10 MG/ML IJ SOLN: 8.0000 mg | Freq: Once | INTRAMUSCULAR | Status: DC

## 2020-12-30 MED ORDER — DEXAMETHASONE 4 MG PO TABS
8.0000 mg | ORAL_TABLET | Freq: Once | ORAL | Status: AC
  Administered 2020-12-30: 8 mg via ORAL
  Filled 2020-12-30: qty 2

## 2020-12-30 NOTE — ED Triage Notes (Signed)
Woke up this morning with hand numbness and stated that she can not move her right hand.  Patient stated that she has been losing her balance for a couple of months.

## 2020-12-30 NOTE — ED Notes (Signed)
Patient transported to CT 

## 2020-12-30 NOTE — ED Provider Notes (Signed)
[facility] EMERGENCY DEPT Provider Note   CSN: [id number] Arrival date & time: 12/30/20  1734     History No chief complaint on file.   Laurie [name] is a 65 y.o. female with PMH COPD, esophageal stricture, HTN, chronic neck and back pain with distant history of C6 fusion who presents the emergency department for evaluation of right hand numbness and weakness.  She states that she awoke this morning after suffering with neck pain last evening with an acute wrist drop.  She states she is unable to extend the wrist.  She has intact biceps function.  Denies numbness, tingling, weakness of other extremities.  She is able to walk without difficulty.  HPI     Past Medical History:  Diagnosis Date   Allergy    Anxiety    Arthritis    RA   Asthma    Chronic back pain    COPD (chronic obstructive pulmonary disease) (HCC)    Depression    Esophageal stricture    GERD (gastroesophageal reflux disease)    Heart murmur    Hematemesis 09/10/2018   Hyperlipidemia    Hypertension    IBS (irritable bowel syndrome)    Interstitial cystitis    Neuromuscular disorder [facility])    fibromyalgia    Patient Active Problem List   Diagnosis Date Noted   Eye swelling 04/27/2020   Hyperthyroidism 02/19/2020   Coronary artery disease involving native coronary artery of native heart with unstable angina pectoris ([place])    Substance abuse ([place]) 07/23/2019   Renal insufficiency 09/10/2018   Elevated troponin 09/10/2018   Leukocytosis 09/10/2018   Hematemesis 09/09/2018   Opiate withdrawal ([place]) 09/09/2018   Vitamin D deficiency 06/10/2018   Shortness of breath 11/05/2017   Elevated brain natriuretic peptide (BNP) level 11/05/2017   Polysubstance (excluding opioids) dependence, daily use ([place]) 11/05/2017   AKI (acute kidney injury) ([place]) 11/05/2017   Low serum vitamin D 08/23/2015   Hyperlipidemia 11/08/2014   Reflux esophagitis 03/23/2014   Dysphagia, pharyngoesophageal  phase 03/23/2014   Pain in the chest 03/23/2014   Special screening for malignant neoplasms, colon 07/09/2013   Chest pain 04/23/2012   Dyspnea 04/23/2012   Murmur 04/23/2012   INTERSTITIAL CYSTITIS 01/01/2010   SWELLING, NECK 01/01/2010   CAD, NATIVE VESSEL 12/19/2009   Palpitations 11/21/2009   ABNORMAL CV (STRESS) TEST 11/21/2009   Essential hypertension 10/31/2009   ELECTROCARDIOGRAM, ABNORMAL 10/25/2009   PLANTAR FASCIITIS, BILATERAL 10/12/2009   CERVICAL RADICULOPATHY 07/04/2008   Pain in soft tissues of limb 03/25/2008   DEGENERATIVE DISC DISEASE, CERVICAL SPINE, W/RADICULOPATHY 09/08/2007   LOW BACK PAIN 09/08/2007   TOBACCO USE 03/23/2007   OSTEOARTHROSIS, LOCAL NOS, LOWER LEG 11/07/2006   STATE, SYMPTOMATIC MENOPAUSE/FEM CLIMACTERIC 10/27/2006   Depression, recurrent ([place]) 09/15/2006   Thoracic or lumbosacral neuritis or radiculitis, unspecified 09/15/2006   IBS 09/09/2006   STRICTURE, ESOPHAGEAL 03/16/2001    Past Surgical History:  Procedure Laterality Date   ABDOMINAL HYSTERECTOMY     APPENDECTOMY     BIOPSY  09/11/2018   Procedure: BIOPSY;  Surgeon: Irving Copas., MD;  Location: [facility] ENDOSCOPY;  Service: Gastroenterology;;   CERVICAL FUSION     CERVICAL LAMINECTOMY     CORONARY STENT INTERVENTION N/A 10/07/2019   Procedure: CORONARY STENT INTERVENTION;  Surgeon: Burnell Blanks, MD;  Location: [facility] CV LAB;  Service: Cardiovascular;  Laterality: N/A;   ESOPHAGOGASTRODUODENOSCOPY     ESOPHAGOGASTRODUODENOSCOPY (EGD) WITH PROPOFOL N/A 09/11/2018   Procedure: ESOPHAGOGASTRODUODENOSCOPY (EGD)  WITH PROPOFOL;  Surgeon: Mansouraty, Telford Nab., MD;  Location: [facility];  Service: Gastroenterology;  Laterality: N/A;   FINGER SURGERY     LEFT HEART CATH AND CORONARY ANGIOGRAPHY N/A 10/07/2019   Procedure: LEFT HEART CATH AND CORONARY ANGIOGRAPHY;  Surgeon: Burnell Blanks, MD;  Location: [facility] CV LAB;  Service: Cardiovascular;   Laterality: N/A;   LUMBAR FUSION     SAVORY DILATION N/A 09/11/2018   Procedure: SAVORY DILATION;  Surgeon: Rush Landmark Telford Nab., MD;  Location: [facility];  Service: Gastroenterology;  Laterality: N/A;   TONSILLECTOMY       OB History     Gravida  17   Para  1   Term      Preterm      AB  11   Living         SAB      IAB  9   Ectopic  2   Multiple      Live Births              Family History  Problem Relation Age of Onset   Dementia Mother    Heart attack Father    Coronary artery disease Father    Cancer Brother        esophageal   Esophageal cancer Brother    Coronary artery disease Paternal Aunt    Stomach cancer Paternal Aunt    Coronary artery disease Paternal Grandmother    Aneurysm Brother        aortic   Rectal cancer Neg Hx    Colon cancer Neg Hx    Thyroid disease Neg Hx     Social History   Tobacco Use   Smoking status: Every Day    Packs/day: 0.50    Types: Cigarettes   Smokeless tobacco: Never   Tobacco comments:    trying to quit   Vaping Use   Vaping Use: Never used  Substance Use Topics   Alcohol use: Never   Drug use: Yes    Types: Marijuana    Comment: 2 days ago    Home Medications Prior to Admission medications   Medication Sig Start Date End Date Taking? Authorizing Provider  albuterol (VENTOLIN HFA) 108 (90 Base) MCG/ACT inhaler Inhale TWO puffs into THE lungs every SIX hours as needed FOR wheezing OR shortness of breath 12/05/20   Burchette, Alinda Sierras, MD  aspirin EC 81 MG tablet Take 1 tablet (81 mg total) by mouth daily. Swallow whole. 09/27/19   Burnell Blanks, MD  atorvastatin (LIPITOR) 40 MG tablet TAKE ONE TABLET BY MOUTH EVERY EVENING 12/06/20   Burnell Blanks, MD  Blood Pressure Monitoring DEVI Use as needed DX I10 04/10/20   Burchette, Alinda Sierras, MD  clopidogrel (PLAVIX) 75 MG tablet Take 1 tablet (75 mg total) by mouth daily. TAKE ONE TABLET BY MOUTH EVERY MORNING 12/06/20   Burnell Blanks, MD  fluticasone [facility]) 50 MCG/ACT nasal spray INSTILL 2 SPRAYS IN EACH NOSTRIL ONCE DAILY 08/28/20   Burchette, Alinda Sierras, MD  guaiFENesin (MUCINEX) 600 MG 12 hr tablet Take 600 mg by mouth 2 (two) times daily as needed.    [provider]  losartan (COZAAR) 50 MG tablet TAKE ONE TABLET BY MOUTH EVERY MORNING 12/05/20   Burchette, Alinda Sierras, MD  Menthol, Topical Analgesic, (BIOFREEZE EX) Apply 1 application topically daily as needed (back pain).    [provider]  methimazole (TAPAZOLE) 10 MG tablet TAKE TWO TABLETS  BY MOUTH EVERY EVENING 10/25/20   Renato Shin, MD  metoprolol succinate (TOPROL-XL) 50 MG 24 hr tablet Take 1 tablet (50 mg total) by mouth daily. Take with or immediately following a meal. 03/13/20   Renato Shin, MD  mupirocin ointment (BACTROBAN) 2 % Apply 1 application topically 2 (two) times daily. 05/01/20   Burchette, Alinda Sierras, MD  nitroGLYCERIN (NITROSTAT) 0.4 MG SL tablet Place 1 tablet (0.4 mg total) under the tongue every 5 (five) minutes as needed for chest pain. 10/19/19 01/17/20  Furth, Cadence H, PA-C  ondansetron (ZOFRAN ODT) 8 MG disintegrating tablet Take 1 tablet (8 mg total) by mouth every 8 (eight) hours as needed for nausea or vomiting. 06/07/20   Burchette, Alinda Sierras, MD  pantoprazole (PROTONIX) 40 MG tablet TAKE ONE TABLET BY MOUTH EVERY MORNING 06/30/20   Burchette, Alinda Sierras, MD  venlafaxine XR (EFFEXOR-XR) 150 MG 24 hr capsule TAKE ONE TABLET BY MOUTH EVERY MORNING 12/29/20   Burchette, Alinda Sierras, MD    Allergies    Codeine, Lisinopril, Sulfonamide derivatives, and Tetracyclines & related  Review of Systems   Review of Systems  Constitutional:  Negative for chills and fever.  HENT:  Negative for ear pain and sore throat.   Eyes:  Negative for pain and visual disturbance.  Respiratory:  Negative for cough and shortness of breath.   Cardiovascular:  Negative for chest pain and palpitations.  Gastrointestinal:  Negative for abdominal  pain and vomiting.  Genitourinary:  Negative for dysuria and hematuria.  Musculoskeletal:  Positive for neck pain. Negative for arthralgias and back pain.  Skin:  Negative for color change and rash.  Neurological:  Positive for weakness and numbness. Negative for seizures and syncope.  All other systems reviewed and are negative.  Physical Exam Updated Vital Signs BP (!) 158/74 (BP Location: Left Arm)   Pulse 64   Temp 98 F (36.7 C) (Oral)   Resp 17   Ht 5\' 10"  (1.778 m)   Wt 80.7 kg   SpO2 100%   BMI 25.54 kg/m   Physical Exam Vitals and nursing note reviewed.  Constitutional:      General: She is not in acute distress.    Appearance: She is well-developed.  HENT:     Head: Normocephalic and atraumatic.  Eyes:     Conjunctiva/sclera: Conjunctivae normal.  Cardiovascular:     Rate and Rhythm: Normal rate and regular rhythm.     Heart sounds: No murmur heard. Pulmonary:     Effort: Pulmonary effort is normal. No respiratory distress.     Breath sounds: Normal breath sounds.  Abdominal:     Palpations: Abdomen is soft.     Tenderness: There is no abdominal tenderness.  Musculoskeletal:        General: No swelling.     Cervical back: Neck supple.  Skin:    General: Skin is warm and dry.     Capillary Refill: Capillary refill takes less than 2 seconds.  Neurological:     Mental Status: She is alert.     Cranial Nerves: No cranial nerve deficit.     Sensory: Sensory deficit (Subjective numbness) present.     Motor: Weakness (Inability to extend the wrist) present.  Psychiatric:        Mood and Affect: Mood normal.    ED Results / Procedures / Treatments   Labs (all labs ordered are listed, but only abnormal results are displayed) Labs Reviewed  COMPREHENSIVE METABOLIC PANEL - Abnormal; Notable  for the following components:      Result Value   CO2 33 (*)    Glucose, Bld 102 (*)    AST 12 (*)    All other components within normal limits  CBC WITH  DIFFERENTIAL/PLATELET    EKG None  Radiology CT Head Wo Contrast  Result Date: 12/30/2020 CLINICAL DATA:  Right upper extremity numbness EXAM: CT HEAD WITHOUT CONTRAST CT CERVICAL SPINE WITHOUT CONTRAST TECHNIQUE: Multidetector CT imaging of the head and cervical spine was performed following the standard protocol without intravenous contrast. Multiplanar CT image reconstructions of the cervical spine were also generated. COMPARISON:  None. FINDINGS: CT HEAD FINDINGS Brain: There is no mass, hemorrhage or extra-axial collection. The size and configuration of the ventricles and extra-axial CSF spaces are normal. The brain parenchyma is normal, without evidence of acute or chronic infarction. Vascular: No abnormal hyperdensity of the major intracranial arteries or dural venous sinuses. No intracranial atherosclerosis. Skull: The visualized skull base, calvarium and extracranial soft tissues are normal. Sinuses/Orbits: No fluid levels or advanced mucosal thickening of the visualized paranasal sinuses. No mastoid or middle ear effusion. The orbits are normal. CT CERVICAL SPINE FINDINGS Alignment: No static subluxation. Facets are aligned. Occipital condyles are normally positioned. Skull base and vertebrae: No acute fracture. Soft tissues and spinal canal: No prevertebral fluid or swelling. No visible canal hematoma. Disc levels: Interbody spacer at C3-4.  C5-6 ACDF. Upper chest: No pneumothorax, pulmonary nodule or pleural effusion. Other: Normal visualized paraspinal cervical soft tissues. IMPRESSION: 1. No acute intracranial abnormality. 2. No acute fracture or static subluxation of the cervical spine. Electronically Signed   By: Ulyses Jarred M.D.   On: 12/30/2020 21:38   CT Cervical Spine Wo Contrast  Result Date: 12/30/2020 CLINICAL DATA:  Right upper extremity numbness EXAM: CT HEAD WITHOUT CONTRAST CT CERVICAL SPINE WITHOUT CONTRAST TECHNIQUE: Multidetector CT imaging of the head and cervical  spine was performed following the standard protocol without intravenous contrast. Multiplanar CT image reconstructions of the cervical spine were also generated. COMPARISON:  None. FINDINGS: CT HEAD FINDINGS Brain: There is no mass, hemorrhage or extra-axial collection. The size and configuration of the ventricles and extra-axial CSF spaces are normal. The brain parenchyma is normal, without evidence of acute or chronic infarction. Vascular: No abnormal hyperdensity of the major intracranial arteries or dural venous sinuses. No intracranial atherosclerosis. Skull: The visualized skull base, calvarium and extracranial soft tissues are normal. Sinuses/Orbits: No fluid levels or advanced mucosal thickening of the visualized paranasal sinuses. No mastoid or middle ear effusion. The orbits are normal. CT CERVICAL SPINE FINDINGS Alignment: No static subluxation. Facets are aligned. Occipital condyles are normally positioned. Skull base and vertebrae: No acute fracture. Soft tissues and spinal canal: No prevertebral fluid or swelling. No visible canal hematoma. Disc levels: Interbody spacer at C3-4.  C5-6 ACDF. Upper chest: No pneumothorax, pulmonary nodule or pleural effusion. Other: Normal visualized paraspinal cervical soft tissues. IMPRESSION: 1. No acute intracranial abnormality. 2. No acute fracture or static subluxation of the cervical spine. Electronically Signed   By: Ulyses Jarred M.D.   On: 12/30/2020 21:38    Procedures Procedures   Medications Ordered in ED Medications  dexamethasone (DECADRON) tablet 8 mg (8 mg Oral Given 12/30/20 2105)    ED Course  I have reviewed the triage vital signs and the nursing notes.  Pertinent labs & imaging results that were available during my care of the patient were reviewed by me and considered in my medical decision  making (see chart for details).    MDM Rules/Calculators/A&P                           Patient seen the emergency department for evaluation  of a new right wrist drop.  Physical exam reveals an inability to extend the wrist on the right with subjective numbness over the dorsum of the hand.  Biceps is intact.  Laboratory evaluation unremarkable.  CT head and C-spine unremarkable.  MRI not available at this facility and I spoke with the patient's neurosurgeon who performed her previous neck surgery Dr Ellene Route who is more concerned about peripheral disease at this time due to lack of biceps or triceps involvement.  He states that he will see the patient on Monday and facilitate EMG.  Patient then discharged. Final Clinical Impression(s) / ED Diagnoses Final diagnoses:  Right wrist drop    Rx / DC Orders ED Discharge Orders     None        Daliah Chaudoin, Debe Coder, MD 12/30/20 [phone]

## 2021-01-01 ENCOUNTER — Telehealth: Payer: Self-pay | Admitting: Pharmacist

## 2021-01-01 NOTE — Chronic Care Management (AMB) (Signed)
Chronic Care Management Pharmacy Assistant   Name: Laurie [name]  MRN: [id number] DOB: Oct 13, 1955   Reason for Encounter: Disease State and Medication Review / Hypertension Assessment and Medication Coordination Call   Conditions to be addressed/monitored: HTN  Recent office visits:  None  Recent consult visits:  None  Hospital visits:  Patient went to [facility] ED on 12/30/2020 due to right wrist drop. Discharge date was 12/30/2020 after 1 hour ED visit. New?Medications Started at [facility] Discharge:?? -None Medication Changes at Hospital Discharge: -None Medications Discontinued at Hospital Discharge: -None??  -All other medications will remain the same.    Medications: Outpatient Encounter Medications as of 01/01/2021  Medication Sig   albuterol (VENTOLIN HFA) 108 (90 Base) MCG/ACT inhaler Inhale TWO puffs into THE lungs every SIX hours as needed FOR wheezing OR shortness of breath   aspirin EC 81 MG tablet Take 1 tablet (81 mg total) by mouth daily. Swallow whole.   atorvastatin (LIPITOR) 40 MG tablet TAKE ONE TABLET BY MOUTH EVERY EVENING   Blood Pressure Monitoring DEVI Use as needed DX I10   clopidogrel (PLAVIX) 75 MG tablet Take 1 tablet (75 mg total) by mouth daily. TAKE ONE TABLET BY MOUTH EVERY MORNING   fluticasone (FLONASE) 50 MCG/ACT nasal spray INSTILL 2 SPRAYS IN EACH NOSTRIL ONCE DAILY   guaiFENesin (MUCINEX) 600 MG 12 hr tablet Take 600 mg by mouth 2 (two) times daily as needed.   losartan (COZAAR) 50 MG tablet TAKE ONE TABLET BY MOUTH EVERY MORNING   Menthol, Topical Analgesic, (BIOFREEZE EX) Apply 1 application topically daily as needed (back pain).   methimazole (TAPAZOLE) 10 MG tablet TAKE TWO TABLETS BY MOUTH EVERY EVENING   metoprolol succinate (TOPROL-XL) 50 MG 24 hr tablet Take 1 tablet (50 mg total) by mouth daily. Take with or immediately following a meal.   mupirocin ointment (BACTROBAN) 2 % Apply 1 application topically 2  (two) times daily.   nitroGLYCERIN (NITROSTAT) 0.4 MG SL tablet Place 1 tablet (0.4 mg total) under the tongue every 5 (five) minutes as needed for chest pain.   ondansetron (ZOFRAN ODT) 8 MG disintegrating tablet Take 1 tablet (8 mg total) by mouth every 8 (eight) hours as needed for nausea or vomiting.   pantoprazole (PROTONIX) 40 MG tablet TAKE ONE TABLET BY MOUTH EVERY MORNING   venlafaxine XR (EFFEXOR-XR) 150 MG 24 hr capsule TAKE ONE TABLET BY MOUTH EVERY MORNING   No facility-administered encounter medications on file as of 01/01/2021.   Fill History: albuterol sulfate HFA 90 mcg/actuation aerosol inhaler 12/06/2020 25   atorvastatin 40 mg tablet 12/08/2020 30   clopidogrel 75 mg tablet 12/08/2020 30   fluticasone propionate 50 mcg/actuation nasal spray,suspension 12/08/2020 30   losartan 50 mg tablet 12/08/2020 30   metoprolol succinate ER 50 mg tablet,extended release 24 hr 12/08/2020 30   pantoprazole 40 mg tablet,delayed release 12/08/2020 30   venlafaxine ER 150 mg capsule,extended release 24 hr 12/08/2020 30   methimazole 10 mg tablet 12/08/2020 30   Reviewed chart prior to disease state call. Spoke with patient regarding BP  Recent Office Vitals: BP Readings from Last 3 Encounters:  12/30/20 (!) 158/74  09/29/20 120/62  05/01/20 120/74   Pulse Readings from Last 3 Encounters:  12/30/20 64  09/29/20 75  05/01/20 68    Wt Readings from Last 3 Encounters:  12/30/20 178 lb (80.7 kg)  09/29/20 166 lb 14.4 oz (75.7 kg)  05/01/20 160 lb (72.6 kg)  Kidney Function Lab Results  Component Value Date/Time   CREATININE 0.92 12/30/2020 09:00 PM   CREATININE 0.88 02/23/2020 05:01 PM   GFR 69.04 02/23/2020 05:01 PM   GFRNONAA >60 12/30/2020 09:00 PM   GFRAA 76 09/27/2019 10:26 AM    BMP Latest Ref Rng & Units 12/30/2020 02/23/2020 09/27/2019  Glucose 70 - 99 mg/dL 102(H) 122(H) 109(H)  BUN 8 - 23 mg/dL 20 20 12   Creatinine 0.44 - 1.00 mg/dL 0.92 0.88 0.92   BUN/Creat Ratio 12 - 28 - - 13  Sodium 135 - 145 mmol/L 140 136 142  Potassium 3.5 - 5.1 mmol/L 3.7 4.2 4.2  Chloride 98 - 111 mmol/L 102 103 102  CO2 22 - 32 mmol/L 33(H) 27 26  Calcium 8.9 - 10.3 mg/dL 9.7 9.3 9.5    Current antihypertensive regimen:  Losartan 50 mg daily Metoprolol 50 mg daily  How often are you checking your Blood Pressure?   Current home BP readings:   What recent interventions/DTPs have been made by any provider to improve Blood Pressure control since last CPP Visit: None  Any recent hospitalizations or ED visits since last visit with CPP? Yes  What diet changes have been made to improve Blood Pressure Control?    What exercise is being done to improve your Blood Pressure Control?    Adherence Review: Is the patient currently on ACE/ARB medication? Yes Does the patient have >5 day gap between last estimated fill dates? No   Reviewed chart for medication changes ahead of medication coordination call.  No OVs, Consults, or hospital visits since last care coordination call/Pharmacist visit. (If appropriate, list visit date, provider name)  No medication changes indicated OR if recent visit, treatment plan here.  BP Readings from Last 3 Encounters:  12/30/20 (!) 158/74  09/29/20 120/62  05/01/20 120/74    Lab Results  Component Value Date   HGBA1C 5.4 09/13/2018     Patient obtains medications through Adherence Packaging  30 Days   Last adherence delivery included:  Methimazole (TAPAZOLE) 10 mg: two tablets at dinner Fluticasone (FLONASE) 50 MCG/ACT nasal spray: Place 2 sprays into both nostrils daily Pantoprazole (PROTONIX) 40 mg: one tablet at breakfast Venlafaxine XR (EFFEXOR-XR) 150 mg: one capsule by mouth with breakfast Clopidogrel (PLAVIX) 75 mg: one tablet at breakfast Atorvastatin (LIPITOR) 40 mg: one tablet at dinner Aspirin EC 81 mg: one tablet at dinner Metoprolol succinate (TOPROL-XL) 50 MG 24 hr: one tablet at  breakfast Losartan (COZAAR) 50 mg: one tablet with breakfast Albuterol HFA : 2 puffs every 6 hours as needed  Patient declined (meds) last month:None  Patient is due for next adherence delivery on: 01/11/2021  Called patient and reviewed medications and coordinated delivery.  This delivery to include: Methimazole (TAPAZOLE) 10 mg: two tablets at dinner Fluticasone (FLONASE) 50 MCG/ACT nasal spray: Place 2 sprays into both nostrils daily Pantoprazole (PROTONIX) 40 mg: one tablet at breakfast Venlafaxine XR (EFFEXOR-XR) 150 mg: one capsule by mouth with breakfast Clopidogrel (PLAVIX) 75 mg: one tablet at breakfast Atorvastatin (LIPITOR) 40 mg: one tablet at dinner Aspirin EC 81 mg: one tablet at dinner Metoprolol succinate (TOPROL-XL) 50 MG 24 hr: one tablet at breakfast Losartan (COZAAR) 50 mg: one tablet with breakfast Albuterol HFA : 2 puffs every 6 hours as needed  Patient will need a short fill:  Coordinated acute fill:  Patient declined the following medications:  Unable to confirm delivery date of 01/11/2021.  Unable to reach patient after several attempts  Care Gaps: AWV - completed 10/06/2020 Last BP - 158/74 on 12/30/2020 Zoster Vaccine- Overdue Mammogram- Overdue Colonoscopy- Overdue COVID Booster #2- Overdue TDAP - Overdue Shingrix - never done Dexa Scan - never done  Star Rating Drugs: Losartan (Cozaar) 50 mg - Last filled 12/08/2020 30 DS at [facility]  Atorvastatin (Lipitor) 40 mg - Last filled 12/08/2020 30 DS at [facility] [phone]

## 2021-01-31 ENCOUNTER — Telehealth: Payer: Self-pay | Admitting: Pharmacist

## 2021-01-31 ENCOUNTER — Other Ambulatory Visit: Payer: Self-pay | Admitting: Family Medicine

## 2021-01-31 NOTE — Chronic Care Management (AMB) (Signed)
Chronic Care Management Pharmacy Assistant   Name: Laurie [name]  MRN: [id number] DOB: 12/23/1955  Reason for Encounter: Disease State and Medication Review / Hypertension Assessment and Medication Coordination Call   Conditions to be addressed/monitored: HTN  Recent office visits:  None  Recent consult visits:  None  Hospital visits:  None  Medications: Outpatient Encounter Medications as of 01/31/2021  Medication Sig   albuterol (VENTOLIN HFA) 108 (90 Base) MCG/ACT inhaler Inhale TWO puffs into THE lungs every SIX hours as needed FOR wheezing OR shortness of breath   aspirin EC 81 MG tablet Take 1 tablet (81 mg total) by mouth daily. Swallow whole.   atorvastatin (LIPITOR) 40 MG tablet TAKE ONE TABLET BY MOUTH EVERY EVENING   Blood Pressure Monitoring DEVI Use as needed DX I10   clopidogrel (PLAVIX) 75 MG tablet Take 1 tablet (75 mg total) by mouth daily. TAKE ONE TABLET BY MOUTH EVERY MORNING   fluticasone (FLONASE) 50 MCG/ACT nasal spray INSTILL 2 SPRAYS IN EACH NOSTRIL ONCE DAILY   guaiFENesin (MUCINEX) 600 MG 12 hr tablet Take 600 mg by mouth 2 (two) times daily as needed.   losartan (COZAAR) 50 MG tablet TAKE ONE TABLET BY MOUTH EVERY MORNING   Menthol, Topical Analgesic, (BIOFREEZE EX) Apply 1 application topically daily as needed (back pain).   methimazole (TAPAZOLE) 10 MG tablet TAKE TWO TABLETS BY MOUTH EVERY EVENING   metoprolol succinate (TOPROL-XL) 50 MG 24 hr tablet Take 1 tablet (50 mg total) by mouth daily. Take with or immediately following a meal.   mupirocin ointment (BACTROBAN) 2 % Apply 1 application topically 2 (two) times daily.   nitroGLYCERIN (NITROSTAT) 0.4 MG SL tablet Place 1 tablet (0.4 mg total) under the tongue every 5 (five) minutes as needed for chest pain.   ondansetron (ZOFRAN ODT) 8 MG disintegrating tablet Take 1 tablet (8 mg total) by mouth every 8 (eight) hours as needed for nausea or vomiting.   pantoprazole (PROTONIX) 40 MG tablet  TAKE ONE TABLET BY MOUTH EVERY MORNING   venlafaxine XR (EFFEXOR-XR) 150 MG 24 hr capsule TAKE ONE TABLET BY MOUTH EVERY MORNING   No facility-administered encounter medications on file as of 01/31/2021.  Fill History: albuterol sulfate HFA 90 mcg/actuation aerosol inhaler 01/08/2021 25   atorvastatin 40 mg tablet 01/08/2021 30   clopidogrel 75 mg tablet 01/08/2021 30   fluticasone propionate 50 mcg/actuation nasal spray,suspension 01/08/2021 30   losartan 50 mg tablet 01/08/2021 30   metoprolol succinate ER 50 mg tablet,extended release 24 hr 01/08/2021 30   pantoprazole 40 mg tablet,delayed release 01/08/2021 30   venlafaxine ER 150 mg capsule,extended release 24 hr 01/08/2021 30   methimazole 10 mg tablet 01/08/2021 30    Reviewed chart prior to disease state call. Spoke with patient regarding BP  Recent Office Vitals: BP Readings from Last 3 Encounters:  12/30/20 (!) 158/74  09/29/20 120/62  05/01/20 120/74   Pulse Readings from Last 3 Encounters:  12/30/20 64  09/29/20 75  05/01/20 68    Wt Readings from Last 3 Encounters:  12/30/20 178 lb (80.7 kg)  09/29/20 166 lb 14.4 oz (75.7 kg)  05/01/20 160 lb (72.6 kg)     Kidney Function Lab Results  Component Value Date/Time   CREATININE 0.92 12/30/2020 09:00 PM   CREATININE 0.88 02/23/2020 05:01 PM   GFR 69.04 02/23/2020 05:01 PM   GFRNONAA >60 12/30/2020 09:00 PM   GFRAA 76 09/27/2019 10:26 AM    BMP Latest  Ref Rng & Units 12/30/2020 02/23/2020 09/27/2019  Glucose 70 - 99 mg/dL 102(H) 122(H) 109(H)  BUN 8 - 23 mg/dL 20 20 12   Creatinine 0.44 - 1.00 mg/dL 0.92 0.88 0.92  BUN/Creat Ratio 12 - 28 - - 13  Sodium 135 - 145 mmol/L 140 136 142  Potassium 3.5 - 5.1 mmol/L 3.7 4.2 4.2  Chloride 98 - 111 mmol/L 102 103 102  CO2 22 - 32 mmol/L 33(H) 27 26  Calcium 8.9 - 10.3 mg/dL 9.7 9.3 9.5    Current antihypertensive regimen:  Losartan 50 mg daily Metoprolol 50 mg daily  How often are you checking your Blood  Pressure?   Current home BP readings:   What recent interventions/DTPs have been made by any provider to improve Blood Pressure control since last CPP Visit:   No fill report for albuterol : Unable to reach patient after several attempts.  Any recent hospitalizations or ED visits since last visit with CPP?   What diet changes have been made to improve Blood Pressure Control?    What exercise is being done to improve your Blood Pressure Control?    Adherence Review: Is the patient currently on ACE/ARB medication? Yes Does the patient have >5 day gap between last estimated fill dates? No   Patient obtains medications through Adherence Packaging  30 Days    Last adherence delivery included:  Methimazole (TAPAZOLE) 10 mg: two tablets at dinner Fluticasone (FLONASE) 50 MCG/ACT nasal spray: Place 2 sprays into both nostrils daily Pantoprazole (PROTONIX) 40 mg: one tablet at breakfast Venlafaxine XR (EFFEXOR-XR) 150 mg: one capsule by mouth with breakfast Clopidogrel (PLAVIX) 75 mg: one tablet at breakfast Atorvastatin (LIPITOR) 40 mg: one tablet at dinner Aspirin EC 81 mg: one tablet at dinner Metoprolol succinate (TOPROL-XL) 50 MG 24 hr: one tablet at breakfast Losartan (COZAAR) 50 mg: one tablet with breakfast Albuterol HFA : 2 puffs every 6 hours as needed   Patient declined (meds) last month:None   Patient is due for next adherence delivery on: 02/12/2021   Called patient and reviewed medications and coordinated delivery.   This delivery to include:   Patient will need a short fill:   Coordinated acute fill:   Patient declined the following medications:   Unable to reach patient after several attempts to confirm delivery date of 02/12/2021.    Care Gaps: AWV - completed 10/06/2020 Last BP - 158/74 on 12/30/2020 Zoster Vaccine- Overdue Mammogram- Overdue Colonoscopy- Overdue COVID Booster #2- Overdue TDAP - Overdue Shingrix - never done Dexa Scan - never  done  Star Rating Drugs: Losartan  50 mg - Last filled 01/08/2021 30 DS at [facility]  Atorvastatin 40 mg - Last filled 01/08/2021 30 DS at [facility] [phone]

## 2021-02-07 ENCOUNTER — Ambulatory Visit: Payer: Medicare Other | Admitting: Family Medicine

## 2021-02-09 ENCOUNTER — Ambulatory Visit (INDEPENDENT_AMBULATORY_CARE_PROVIDER_SITE_OTHER): Payer: Medicare Other | Admitting: Family Medicine

## 2021-02-09 VITALS — BP 142/70 | HR 60 | Temp 97.5°F | Wt 180.3 lb

## 2021-02-09 DIAGNOSIS — Z9889 Other specified postprocedural states: Secondary | ICD-10-CM | POA: Diagnosis not present

## 2021-02-09 DIAGNOSIS — T24112A Burn of first degree of left thigh, initial encounter: Secondary | ICD-10-CM | POA: Diagnosis not present

## 2021-02-09 DIAGNOSIS — M21331 Wrist drop, right wrist: Secondary | ICD-10-CM | POA: Diagnosis not present

## 2021-02-09 DIAGNOSIS — G8928 Other chronic postprocedural pain: Secondary | ICD-10-CM | POA: Diagnosis not present

## 2021-02-09 DIAGNOSIS — M542 Cervicalgia: Secondary | ICD-10-CM | POA: Diagnosis not present

## 2021-02-09 DIAGNOSIS — R600 Localized edema: Secondary | ICD-10-CM | POA: Diagnosis not present

## 2021-02-09 LAB — COMPREHENSIVE METABOLIC PANEL
ALT: 14 U/L (ref 0–35)
AST: 13 U/L (ref 0–37)
Albumin: 4.1 g/dL (ref 3.5–5.2)
Alkaline Phosphatase: 111 U/L (ref 39–117)
BUN: 17 mg/dL (ref 6–23)
CO2: 31 mEq/L (ref 19–32)
Calcium: 9.5 mg/dL (ref 8.4–10.5)
Chloride: 103 mEq/L (ref 96–112)
Creatinine, Ser: 0.83 mg/dL (ref 0.40–1.20)
GFR: 73.56 mL/min (ref >60.00)
Glucose, Bld: 93 mg/dL (ref 70–99)
Potassium: 4.6 mEq/L (ref 3.5–5.1)
Sodium: 140 mEq/L (ref 135–145)
Total Bilirubin: 0.3 mg/dL (ref 0.2–1.2)
Total Protein: 7.2 g/dL (ref 6.0–8.3)

## 2021-02-09 LAB — MICROALBUMIN / CREATININE URINE RATIO
Creatinine,U: 146.1 mg/dL
Microalb Creat Ratio: 1.8 mg/g (ref 0.0–30.0)
Microalb, Ur: 2.7 mg/dL — ABNORMAL HIGH (ref 0.0–1.9)

## 2021-02-09 LAB — BRAIN NATRIURETIC PEPTIDE: Pro B Natriuretic peptide (BNP): 51 pg/mL (ref 0.0–100.0)

## 2021-02-09 MED ORDER — FUROSEMIDE 20 MG PO TABS: ORAL_TABLET | ORAL | 1 refills | Status: DC
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
## 2021-02-13 ENCOUNTER — Telehealth: Payer: Self-pay | Admitting: Family Medicine

## 2021-02-16 NOTE — Telephone Encounter (Signed)
Error/njr °

## 2021-02-28 ENCOUNTER — Other Ambulatory Visit: Payer: Self-pay | Admitting: Family Medicine

## 2021-02-28 ENCOUNTER — Other Ambulatory Visit: Payer: Self-pay | Admitting: Cardiovascular Disease

## 2021-03-01 ENCOUNTER — Telehealth: Payer: Self-pay | Admitting: Pharmacist

## 2021-03-01 NOTE — Chronic Care Management (AMB) (Signed)
Chronic Care Management Pharmacy Assistant   Name: Laurie [name]  MRN: [id number] DOB: 20-Aug-1955  Reason for Encounter: Medication Review / Medication Coordination Call   Conditions to be addressed/monitored: HTN   Recent office visits:  02/09/2021 Laurie Littler MD - Patient was seen for bilateral leg edema and additional issues. Started Furosemide 20 mg daily as needed for leg edema. Follow up in 3 weeks.   Recent consult visits:  None  Hospital visits:  None  Medications: Outpatient Encounter Medications as of 03/01/2021  Medication Sig   albuterol (VENTOLIN HFA) 108 (90 Base) MCG/ACT inhaler Inhale TWO puffs into THE lungs every SIX hours as needed FOR wheezing OR shortness of breath   aspirin EC 81 MG tablet Take 1 tablet (81 mg total) by mouth daily. Swallow whole.   atorvastatin (LIPITOR) 40 MG tablet Take 1 tablet (40 mg total) by mouth every evening. Pt needs to make appt with provider before further refills - 1st attempt   Blood Pressure Monitoring DEVI Use as needed DX I10   clopidogrel (PLAVIX) 75 MG tablet Take 1 tablet (75 mg total) by mouth every morning. Pt needs to make appt with provider before further refills - 1st attempt   fluticasone (FLONASE) 50 MCG/ACT nasal spray INSTILL 2 SPRAYS IN EACH NOSTRIL ONCE DAILY   furosemide (LASIX) 20 MG tablet Take one tablet by mouth daily as needed for leg edema.   guaiFENesin (MUCINEX) 600 MG 12 hr tablet Take 600 mg by mouth 2 (two) times daily as needed.   losartan (COZAAR) 50 MG tablet TAKE ONE TABLET BY MOUTH EVERY MORNING   Menthol, Topical Analgesic, (BIOFREEZE EX) Apply 1 application topically daily as needed (back pain).   methimazole (TAPAZOLE) 10 MG tablet TAKE TWO TABLETS BY MOUTH EVERY EVENING   metoprolol succinate (TOPROL-XL) 50 MG 24 hr tablet Take 1 tablet (50 mg total) by mouth daily. Take with or immediately following a meal.   mupirocin ointment (BACTROBAN) 2 % Apply 1 application topically 2  (two) times daily.   nitroGLYCERIN (NITROSTAT) 0.4 MG SL tablet Place 1 tablet (0.4 mg total) under the tongue every 5 (five) minutes as needed for chest pain.   ondansetron (ZOFRAN ODT) 8 MG disintegrating tablet Take 1 tablet (8 mg total) by mouth every 8 (eight) hours as needed for nausea or vomiting.   pantoprazole (PROTONIX) 40 MG tablet TAKE ONE TABLET BY MOUTH EVERY MORNING   venlafaxine XR (EFFEXOR-XR) 150 MG 24 hr capsule TAKE ONE TABLET BY MOUTH EVERY MORNING   No facility-administered encounter medications on file as of 03/01/2021.   Reviewed chart for medication changes ahead of medication coordination call.  No OVs, Consults, or hospital visits since last care coordination call/Pharmacist visit. (If appropriate, list visit date, provider name)  No medication changes indicated OR if recent visit, treatment plan here.  BP Readings from Last 3 Encounters:  02/09/21 (!) 142/70  12/30/20 (!) 158/74  09/29/20 120/62    Lab Results  Component Value Date   HGBA1C 5.4 09/13/2018     Patient obtains medications through Adherence Packaging  30 Days    Last adherence delivery included:  Methimazole (TAPAZOLE) 10 mg: two tablets at dinner Fluticasone (FLONASE) 50 MCG/ACT nasal spray: Place 2 sprays into both nostrils daily Pantoprazole (PROTONIX) 40 mg: one tablet at breakfast Venlafaxine XR (EFFEXOR-XR) 150 mg: one capsule by mouth with breakfast Clopidogrel (PLAVIX) 75 mg: one tablet at breakfast Atorvastatin (LIPITOR) 40 mg: one tablet at dinner Aspirin EC  81 mg: one tablet at dinner Metoprolol succinate (TOPROL-XL) 50 MG 24 hr: one tablet at breakfast Losartan (COZAAR) 50 mg: one tablet with breakfast Albuterol HFA : 2 puffs every 6 hours as needed   Patient declined (meds) last month:No medications declined   Patient is due for next adherence delivery on: 03/13/2021   Called patient and reviewed medications and coordinated delivery.  This delivery to include:   Losartan (COZAAR) 50 mg: one tablet with breakfast Methimazole (TAPAZOLE) 10 mg: two tablets at dinner Fluticasone (FLONASE) 50 MCG/ACT nasal spray: Place 2 sprays into both nostrils daily Pantoprazole (PROTONIX) 40 mg: one tablet at breakfast Venlafaxine XR (EFFEXOR-XR) 150 mg: one capsule by mouth with breakfast Clopidogrel (PLAVIX) 75 mg: one tablet at breakfast Atorvastatin (LIPITOR) 40 mg: one tablet at dinner Aspirin EC 81 mg: one tablet at dinner Metoprolol succinate (TOPROL-XL) 50 MG 24 hr: one tablet at breakfast Albuterol HFA : 2 puffs every 6 hours as needed Furosemide 30 mg 1 tablet daily as needed  Patient will need a short fill:   Coordinated acute fill:   Patient declined the following medications:  Unable to confirmed delivery date of 03/13/2021, unable to reach patient after several attempts.   Care Gaps: AWV - completed 10/06/2020 Last BP - 142/70 on 02/09/2021 Pneumonia vaccine - never done Zoster vaccine - never done Mammogram - overdue Colonoscopy - overdue Covid vaccine - overdue TDAP - overdue Dexascan - never done  Star Rating Drugs: Losartan  50 mg - Last filled 02/05/2021 30 DS at [facility]  Atorvastatin 40 mg - Last filled 02/05/2021 30 DS at [facility] [phone]

## 2021-03-07 DIAGNOSIS — M5412 Radiculopathy, cervical region: Secondary | ICD-10-CM | POA: Diagnosis not present

## 2021-03-07 DIAGNOSIS — I10 Essential (primary) hypertension: Secondary | ICD-10-CM | POA: Diagnosis not present

## 2021-03-07 DIAGNOSIS — I6782 Cerebral ischemia: Secondary | ICD-10-CM | POA: Diagnosis not present

## 2021-03-07 DIAGNOSIS — M545 Low back pain, unspecified: Secondary | ICD-10-CM | POA: Diagnosis not present

## 2021-03-08 ENCOUNTER — Other Ambulatory Visit (HOSPITAL_COMMUNITY): Payer: Self-pay | Admitting: Neurological Surgery

## 2021-03-08 ENCOUNTER — Other Ambulatory Visit: Payer: Self-pay | Admitting: Neurological Surgery

## 2021-03-08 DIAGNOSIS — I6782 Cerebral ischemia: Secondary | ICD-10-CM

## 2021-03-21 DIAGNOSIS — R69 Illness, unspecified: Secondary | ICD-10-CM | POA: Diagnosis not present

## 2021-03-26 ENCOUNTER — Other Ambulatory Visit: Payer: Self-pay

## 2021-03-26 ENCOUNTER — Ambulatory Visit (HOSPITAL_COMMUNITY)
Admission: RE | Admit: 2021-03-26 | Discharge: 2021-03-26 | Disposition: A | Discharge status: Home / Self Care | Payer: Medicare Other | Source: Ambulatory Visit | Attending: Neurological Surgery | Admitting: Neurological Surgery

## 2021-03-26 DIAGNOSIS — I6782 Cerebral ischemia: Secondary | ICD-10-CM | POA: Diagnosis not present

## 2021-03-26 DIAGNOSIS — R200 Anesthesia of skin: Secondary | ICD-10-CM | POA: Diagnosis not present

## 2021-03-26 LAB — POCT I-STAT CREATININE: Creatinine, Ser: 1.2 mg/dL — ABNORMAL HIGH (ref 0.44–1.00)

## 2021-03-26 MED ORDER — IOHEXOL 350 MG/ML SOLN
80.0000 mL | Freq: Once | INTRAVENOUS | Status: AC | PRN
  Administered 2021-03-26: 80 mL via INTRAVENOUS

## 2021-03-27 ENCOUNTER — Emergency Department (HOSPITAL_BASED_OUTPATIENT_CLINIC_OR_DEPARTMENT_OTHER): Payer: Medicare Other

## 2021-03-27 ENCOUNTER — Encounter (HOSPITAL_BASED_OUTPATIENT_CLINIC_OR_DEPARTMENT_OTHER): Payer: Self-pay

## 2021-03-27 ENCOUNTER — Emergency Department (HOSPITAL_BASED_OUTPATIENT_CLINIC_OR_DEPARTMENT_OTHER): Payer: Medicare Other | Admitting: Radiology

## 2021-03-27 ENCOUNTER — Other Ambulatory Visit: Payer: Self-pay

## 2021-03-27 ENCOUNTER — Emergency Department (HOSPITAL_BASED_OUTPATIENT_CLINIC_OR_DEPARTMENT_OTHER)
Admission: EM | Admit: 2021-03-27 | Discharge: 2021-03-27 | Disposition: A | Discharge status: Home / Self Care | Payer: Medicare Other | Attending: Emergency Medicine | Admitting: Emergency Medicine

## 2021-03-27 DIAGNOSIS — Z79899 Other long term (current) drug therapy: Secondary | ICD-10-CM | POA: Insufficient documentation

## 2021-03-27 DIAGNOSIS — I2510 Atherosclerotic heart disease of native coronary artery without angina pectoris: Secondary | ICD-10-CM | POA: Diagnosis not present

## 2021-03-27 DIAGNOSIS — Y92009 Unspecified place in unspecified non-institutional (private) residence as the place of occurrence of the external cause: Secondary | ICD-10-CM | POA: Diagnosis not present

## 2021-03-27 DIAGNOSIS — W010XXA Fall on same level from slipping, tripping and stumbling without subsequent striking against object, initial encounter: Secondary | ICD-10-CM | POA: Insufficient documentation

## 2021-03-27 DIAGNOSIS — M25551 Pain in right hip: Secondary | ICD-10-CM | POA: Diagnosis not present

## 2021-03-27 DIAGNOSIS — M545 Low back pain, unspecified: Secondary | ICD-10-CM | POA: Diagnosis not present

## 2021-03-27 DIAGNOSIS — S32030A Wedge compression fracture of third lumbar vertebra, initial encounter for closed fracture: Secondary | ICD-10-CM

## 2021-03-27 DIAGNOSIS — I10 Essential (primary) hypertension: Secondary | ICD-10-CM | POA: Diagnosis not present

## 2021-03-27 DIAGNOSIS — Z7951 Long term (current) use of inhaled steroids: Secondary | ICD-10-CM | POA: Diagnosis not present

## 2021-03-27 DIAGNOSIS — J449 Chronic obstructive pulmonary disease, unspecified: Secondary | ICD-10-CM | POA: Insufficient documentation

## 2021-03-27 DIAGNOSIS — W19XXXA Unspecified fall, initial encounter: Secondary | ICD-10-CM

## 2021-03-27 DIAGNOSIS — Z7982 Long term (current) use of aspirin: Secondary | ICD-10-CM | POA: Diagnosis not present

## 2021-03-27 DIAGNOSIS — S3992XA Unspecified injury of lower back, initial encounter: Secondary | ICD-10-CM | POA: Diagnosis present

## 2021-03-27 MED ORDER — HYDROCODONE-ACETAMINOPHEN 5-325 MG PO TABS
1.0000 | ORAL_TABLET | Freq: Once | ORAL | Status: AC
  Administered 2021-03-27: 1 via ORAL
  Filled 2021-03-27: qty 1

## 2021-03-27 MED ORDER — OXYCODONE-ACETAMINOPHEN 5-325 MG PO TABS: 1.0000 | ORAL_TABLET | ORAL | 0 refills | Status: DC | PRN

## 2021-03-27 NOTE — ED Provider Notes (Signed)
[place] EMERGENCY DEPT Provider Note   CSN: [id number] Arrival date & time: 03/27/21  0243     History  Chief Complaint  Patient presents with   Laurie [name] is a 66 y.o. female.  The history is provided by the patient.  Fall She has history of hypertension, hyperlipidemia, chronic back pain, COPD, renal insufficiency, chronic back pain, coronary artery disease and comes in complaining of pain in her lower back and right hip following a fall at home.  She states that she tripped over her own feet and fell landing on her back.  She denies head, neck injury and denies any extremity injury.  She is complaining of severe pain in her back.  She is able to bear weight on her right hip, but barely.   Home Medications Prior to Admission medications   Medication Sig Start Date End Date Taking? Authorizing Provider  albuterol (VENTOLIN HFA) 108 (90 Base) MCG/ACT inhaler Inhale TWO puffs into THE lungs every SIX hours as needed FOR wheezing OR shortness of breath 12/05/20   Burchette, Alinda Sierras, MD  aspirin EC 81 MG tablet Take 1 tablet (81 mg total) by mouth daily. Swallow whole. 09/27/19   Burnell Blanks, MD  atorvastatin (LIPITOR) 40 MG tablet Take 1 tablet (40 mg total) by mouth every evening. Pt needs to make appt with provider before further refills - 1st attempt 02/28/21   Burnell Blanks, MD  Blood Pressure Monitoring DEVI Use as needed DX I10 04/10/20   Burchette, Alinda Sierras, MD  clopidogrel (PLAVIX) 75 MG tablet Take 1 tablet (75 mg total) by mouth every morning. Pt needs to make appt with provider before further refills - 1st attempt 02/28/21   Burnell Blanks, MD  fluticasone [facility]) 50 MCG/ACT nasal spray INSTILL 2 SPRAYS IN EACH NOSTRIL ONCE DAILY 08/28/20   Burchette, Alinda Sierras, MD  furosemide (LASIX) 20 MG tablet Take one tablet by mouth daily as needed for leg edema. 02/09/21   Burchette, Alinda Sierras, MD  guaiFENesin (MUCINEX) 600 MG 12  hr tablet Take 600 mg by mouth 2 (two) times daily as needed.    [provider]  losartan (COZAAR) 50 MG tablet TAKE ONE TABLET BY MOUTH EVERY MORNING 12/05/20   Burchette, Alinda Sierras, MD  Menthol, Topical Analgesic, (BIOFREEZE EX) Apply 1 application topically daily as needed (back pain).    [provider]  methimazole (TAPAZOLE) 10 MG tablet TAKE TWO TABLETS BY MOUTH EVERY EVENING 10/25/20   Renato Shin, MD  metoprolol succinate (TOPROL-XL) 50 MG 24 hr tablet Take 1 tablet (50 mg total) by mouth daily. Take with or immediately following a meal. 03/13/20   Renato Shin, MD  mupirocin ointment (BACTROBAN) 2 % Apply 1 application topically 2 (two) times daily. 05/01/20   Burchette, Alinda Sierras, MD  nitroGLYCERIN (NITROSTAT) 0.4 MG SL tablet Place 1 tablet (0.4 mg total) under the tongue every 5 (five) minutes as needed for chest pain. 10/19/19 01/17/20  Furth, Cadence H, PA-C  ondansetron (ZOFRAN ODT) 8 MG disintegrating tablet Take 1 tablet (8 mg total) by mouth every 8 (eight) hours as needed for nausea or vomiting. 06/07/20   Burchette, Alinda Sierras, MD  pantoprazole (PROTONIX) 40 MG tablet TAKE ONE TABLET BY MOUTH EVERY MORNING 01/31/21   Burchette, Alinda Sierras, MD  venlafaxine XR (EFFEXOR-XR) 150 MG 24 hr capsule TAKE ONE TABLET BY MOUTH EVERY MORNING 02/28/21   Burchette, Alinda Sierras, MD  Allergies    Codeine, Lisinopril, Sulfonamide derivatives, and Tetracyclines & related    Review of Systems   Review of Systems  All other systems reviewed and are negative.  Physical Exam Updated Vital Signs BP (!) 134/91 (BP Location: Left Arm)    Pulse 68    Temp 98.1 F (36.7 C) (Oral)    Resp 20    Ht 5\' 10"  (1.778 m)    Wt 81.6 kg    SpO2 100%    BMI 25.83 kg/m  Physical Exam Vitals and nursing note reviewed.  66 year old female, crying in pain, but in no acute distress. Vital signs are significant for borderline elevated blood pressure. Oxygen saturation is 100%, which is normal. Head is  normocephalic and atraumatic. PERRLA, EOMI. Oropharynx is clear. Neck is nontender and supple without adenopathy or JVD. Back is mildly tender in the mid and lumbar area without point tenderness.  There is no CVA tenderness. Lungs are clear without rales, wheezes, or rhonchi. Chest is nontender. Heart has regular rate and rhythm without murmur. Abdomen is soft, flat, nontender without masses or hepatosplenomegaly and peristalsis is normoactive. Extremities have no cyanosis or edema, full range of motion is present, but she does complain with passive range of motion of both hips. Skin is warm and dry without rash. Neurologic: Mental status is normal, cranial nerves are intact, moves all extremities equally.  ED Results / Procedures / Treatments    Radiology DG Lumbar Spine Complete  Result Date: 03/27/2021 CLINICAL DATA:  66 year old female status post fall yesterday. Continued pain. EXAM: LUMBAR SPINE - COMPLETE 4+ VIEW COMPARISON:  Lumbar radiographs 03/07/2021 and earlier. FINDINGS: Normal lumbar segmentation. Mild to moderate levoconvex lumbar scoliosis and straightening of lumbar lordosis appears stable from last month. New L3 superior endplate compression with estimated 30-40% loss of anterior vertebral body height. No retropulsion is evident. The adjacent L2 and L4 levels appear stable and intact. No other No acute osseous abnormality identified., grossly intact visible sacrum. Lumbar degeneration appears stable with maximal disc space loss at L5-S1. Calcified aortic atherosclerosis. IMPRESSION: 1. L3 superior endplate compression fracture is new from last month and likely acute, with estimated 30-40% loss of vertebral body height. No retropulsion or other complicating features are evident. 2. No other acute osseous abnormality identified in the lumbar spine. 3. Aortic Atherosclerosis (ICD10-I70.0). Electronically Signed   By: Genevie Ann M.D.   On: 03/27/2021 05:01   CT ANGIO NECK W OR WO  CONTRAST  Result Date: 03/27/2021 CLINICAL DATA:  66 year old female status post fall yesterday. Bilateral upper extremity numbness and weakness. Prior cervical spine surgery. EXAM: CT ANGIOGRAPHY NECK TECHNIQUE: Multidetector CT imaging of the neck was performed using the standard protocol during bolus administration of intravenous contrast. Multiplanar CT image reconstructions and MIPs were obtained to evaluate the vascular anatomy. Carotid stenosis measurements (when applicable) are obtained utilizing NASCET criteria, using the distal internal carotid diameter as the denominator. RADIATION DOSE REDUCTION: This exam was performed according to the departmental dose-optimization program which includes automated exposure control, adjustment of the mA and/or kV according to patient size and/or use of iterative reconstruction technique. CONTRAST:  31mL OMNIPAQUE IOHEXOL 350 MG/ML SOLN COMPARISON:  Cervical spine CT 12/30/2020. MRI 05/16/2020. Head CT 12/30/2020. FINDINGS: Skeleton: Previous C3-C4 disc arthroplasty, C5-C6 ACDF. Solid interbody arthrodesis at the latter, and chronic facet ankylosis at the former more pronounced on the left. Cervical hardware appears stable since last year. Mildly exaggerated upper cervical lordosis is stable. Cervicothoracic junction  alignment is within normal limits. Bilateral posterior element alignment is within normal limits. Visualized skull base is intact. No atlanto-occipital dissociation. Chronic degenerative right C2-C3 facet arthropathy. No acute osseous abnormality identified. Cervical spinal canal appears stable since last year, possible mild chronic spinal stenosis at the C6-C7 adjacent segment with chronic pronounced disc space loss and vacuum disc there. Bubbly opacity in the left maxillary sinus. Other Visualized paranasal sinuses and mastoids are well aerated. Upper chest: Mild upper lung scarring and atelectasis. Visible superior mediastinal lymph nodes are within  normal limits. Other neck: Chronically enlarged and heterogeneous thyroid (series 7, image 91). Otherwise negative neck soft tissues. Aortic arch: 3 vessel arch configuration. Mild combined soft and calcified arch atherosclerosis. Right carotid system: Mild brachiocephalic artery plaque without stenosis. Negative right CCA origin. Mild mostly soft plaque in the right CCA proximal to the bifurcation without stenosis. Disc arthroplasty streak artifact at the right carotid bifurcation which appears widely patent. Mild proximal right ICA plaque without stenosis. Visible right ICA siphon is patent with mild calcified plaque. Left carotid system: Similar mild left carotid atherosclerosis with no significant stenosis through the visible left siphon. Tortuous left ICA at the C1-C2 level with a mildly kinked appearance. Vertebral arteries: Mild calcified plaque at the right subclavian artery origin without stenosis. Normal right vertebral artery origin. Late entry of the right vertebral artery into the cervical transverse foramen. C3-C4 disc arthroplasty artifact obscures the vessel at that level, but otherwise the right vertebral is patent without stenosis to the vertebrobasilar junction. The right V4 segment is non dominant. Right PICA origin is patent. Visible basilar artery is patent. Mild to moderate soft and calcified plaque in the proximal left subclavian artery without significant stenosis. Normal left vertebral artery origin. Tortuous left V1 segment. Somewhat dominant left vertebral artery, especially in the V4 segment, is patent to the vertebrobasilar junction with no significant plaque or stenosis. Normal left PICA origin. Anatomic variants: Dominant left vertebral artery. Review of the MIP images confirms the above findings IMPRESSION: 1. Mild carotid atherosclerosis in the neck. No significant visible carotid or vertebral artery stenosis. 2. Previous cervical spine surgery at C3-C4 and C5-C6 with some  arthrodesis/ankylosis at both levels. Chronic C6-C7 adjacent segment disease with vacuum disc and possible mild spinal stenosis there. 3. Enlarged and heterogeneous thyroid gland. Recommend thyroid ultrasound (ref: J Am Coll Radiol. 2015 Feb;12(2): 143-50). 4. Mild left maxillary sinus inflammation. 5. Aortic Atherosclerosis (ICD10-I70.0). Electronically Signed   By: Genevie Ann M.D.   On: 03/27/2021 05:13   CT Lumbar Spine Wo Contrast  Result Date: 03/27/2021 CLINICAL DATA:  66 year old female status post fall yesterday with continued pain and L3 compression fracture on lumbar radiographs today. EXAM: CT LUMBAR SPINE WITHOUT CONTRAST TECHNIQUE: Multidetector CT imaging of the lumbar spine was performed without intravenous contrast administration. Multiplanar CT image reconstructions were also generated. RADIATION DOSE REDUCTION: This exam was performed according to the departmental dose-optimization program which includes automated exposure control, adjustment of the mA and/or kV according to patient size and/or use of iterative reconstruction technique. COMPARISON:  Radiographs earlier today.  Lumbar MRI 08/30/2019. FINDINGS: Segmentation: Normal. Alignment: Chronic levoconvex lumbar scoliosis with straightening of lumbar lordosis. Chronic mild retrolisthesis of L5 on S1 is stable since 2021. Vertebrae: Visible posterior ribs and lower thoracic levels appear intact. L1 and L2 appears stable and intact. Mildly comminuted L3 superior endplate compression fracture with impaction. Un healed anterior superior endplate fracture lucency (sagittal image 31 and series 3, image 60). Anterior wedging  with approximately 25% loss of anterior vertebral body height. No significant retropulsion. L3 pedicles and posterior elements appear intact. L4 and L5 levels appear stable and intact. Visible sacrum, SI joints and iliac bones appear intact. Paraspinal and other soft tissues: Normal caliber abdominal aorta. Aortoiliac calcified  atherosclerosis. Some postcontrast renal enhancement and contrast excretion with no adverse features. Negative visible other abdominal viscera. Minor paraspinal edema or contusion anteriorly at L3. Disc levels: Underlying Capacious lumbar spinal canal. Lumbar disc and endplate degeneration appears stable since 2021 except at L1-L2 where there is new circumferential disc bulging in conjunction with the compression fracture. Disc material is eccentric to the right. Mild new spinal and moderate right lateral recess stenosis at that level (right L3 nerve level). No convincing foraminal stenosis. IMPRESSION: 1. Mildly comminuted L3 superior endplate compression fracture with anterior wedging, 25% loss of anterior vertebral body height. No significant retropulsion, but new circumferential L2-L3 disc bulging eccentric to the right results in mild spinal and moderate right lateral recess stenosis which is new from a 2021 MRI. 2. No other acute osseous abnormality identified. Underlying capacious lumbar spinal canal. Other lumbar disc and endplate degeneration appears stable since 2021. 3. Aortic Atherosclerosis (ICD10-I70.0). Electronically Signed   By: Genevie Ann M.D.   On: 03/27/2021 05:49   DG Hip Unilat W or Wo Pelvis 2-3 Views Right  Result Date: 03/27/2021 CLINICAL DATA:  66 year old female status post fall yesterday. Pain. EXAM: DG HIP (WITH OR WITHOUT PELVIS) 2-3V RIGHT COMPARISON:  Lumbar radiographs today.  Right hip series 07/23/2019. FINDINGS: Excreted IV contrast in the urinary bladder. Pelvis appears stable and intact. Femoral heads are normally located. SI joints appear stable. Grossly intact proximal left femur. Proximal right femur appears stable and intact. Incidental pelvic vascular calcifications. Negative visible bowel gas. IMPRESSION: No acute fracture or dislocation identified about the right hip or pelvis. Electronically Signed   By: Genevie Ann M.D.   On: 03/27/2021 05:03    Procedures Procedures     Medications Ordered in ED Medications  HYDROcodone-acetaminophen (NORCO/VICODIN) 5-325 MG per tablet 1 tablet (has no administration in time range)    ED Course/ Medical Decision Making/ A&P                           Medical Decision Making Amount and/or Complexity of Data Reviewed Radiology: ordered.  Risk Prescription drug management.   Fall with injury to lower back and right hip.  She is being sent for x-rays to rule out fractures.  She is given a dose of hydrocodone-acetaminophen for pain.  Old records are reviewed, and she actually has a normal creatinine.  Hip x-ray shows no evidence of fracture.  Lumbar spine x-ray shows a new compression fracture at L3 with no evidence of retropulsion.  I have independently viewed these images, and agree with the radiologist's interpretation.  She is sent for CT scan to further characterize the L3 compression fracture and it does show that the fracture is comminuted with 25% loss of anterior vertebral body height.  I have independently viewed these images, and agree with the radiologist's interpretation.  She is discharged with prescription for oxycodone-acetaminophen and is referred to her neurosurgeon for follow-up.        Final Clinical Impression(s) / ED Diagnoses Final diagnoses:  Fall in home, initial encounter  Closed compression fracture of L3 lumbar vertebra, initial encounter ([place])  Elevated blood pressure reading with diagnosis of hypertension  Rx / DC Orders ED Discharge Orders          Ordered    oxyCODONE-acetaminophen (PERCOCET) 5-325 MG tablet  Every 4 hours PRN        03/27/21 0300              Delora Fuel, MD 92/33/00 [phone]

## 2021-03-27 NOTE — ED Notes (Signed)
Patient transported to X-ray 

## 2021-03-27 NOTE — ED Triage Notes (Signed)
Fell yesterday around 10:30 and after tripping over her own foot landing on linoleum floor. Denies any loss of consciousness or amnesia to the event. Now complains with lower back pain.

## 2021-03-29 ENCOUNTER — Other Ambulatory Visit: Payer: Self-pay | Admitting: Neurological Surgery

## 2021-03-29 ENCOUNTER — Observation Stay (HOSPITAL_COMMUNITY)
Admission: AD | Admit: 2021-03-29 | Discharge: 2021-03-29 | Disposition: A | Discharge status: Home / Self Care | Payer: Medicare Other | Source: Ambulatory Visit | Attending: Neurological Surgery | Admitting: Neurological Surgery

## 2021-03-29 ENCOUNTER — Ambulatory Visit (HOSPITAL_COMMUNITY): Payer: Medicare Other

## 2021-03-29 ENCOUNTER — Encounter (HOSPITAL_COMMUNITY)
Admission: AD | Disposition: A | Discharge status: Home / Self Care | Payer: Self-pay | Source: Ambulatory Visit | Attending: Neurological Surgery

## 2021-03-29 ENCOUNTER — Ambulatory Visit (HOSPITAL_BASED_OUTPATIENT_CLINIC_OR_DEPARTMENT_OTHER): Payer: Medicare Other | Admitting: Certified Registered"

## 2021-03-29 ENCOUNTER — Other Ambulatory Visit: Payer: Self-pay

## 2021-03-29 ENCOUNTER — Encounter (HOSPITAL_COMMUNITY): Payer: Self-pay | Admitting: Neurological Surgery

## 2021-03-29 ENCOUNTER — Ambulatory Visit (HOSPITAL_COMMUNITY): Payer: Medicare Other | Admitting: Certified Registered"

## 2021-03-29 DIAGNOSIS — S32000A Wedge compression fracture of unspecified lumbar vertebra, initial encounter for closed fracture: Secondary | ICD-10-CM | POA: Diagnosis present

## 2021-03-29 DIAGNOSIS — W1789XA Other fall from one level to another, initial encounter: Secondary | ICD-10-CM | POA: Diagnosis not present

## 2021-03-29 DIAGNOSIS — S32038A Other fracture of third lumbar vertebra, initial encounter for closed fracture: Principal | ICD-10-CM | POA: Insufficient documentation

## 2021-03-29 DIAGNOSIS — M4856XA Collapsed vertebra, not elsewhere classified, lumbar region, initial encounter for fracture: Secondary | ICD-10-CM

## 2021-03-29 DIAGNOSIS — J45909 Unspecified asthma, uncomplicated: Secondary | ICD-10-CM | POA: Diagnosis not present

## 2021-03-29 DIAGNOSIS — I10 Essential (primary) hypertension: Secondary | ICD-10-CM | POA: Diagnosis not present

## 2021-03-29 DIAGNOSIS — F17210 Nicotine dependence, cigarettes, uncomplicated: Secondary | ICD-10-CM | POA: Insufficient documentation

## 2021-03-29 DIAGNOSIS — J449 Chronic obstructive pulmonary disease, unspecified: Secondary | ICD-10-CM | POA: Diagnosis not present

## 2021-03-29 DIAGNOSIS — Z79899 Other long term (current) drug therapy: Secondary | ICD-10-CM | POA: Diagnosis not present

## 2021-03-29 DIAGNOSIS — I25119 Atherosclerotic heart disease of native coronary artery with unspecified angina pectoris: Secondary | ICD-10-CM | POA: Diagnosis not present

## 2021-03-29 DIAGNOSIS — Z981 Arthrodesis status: Secondary | ICD-10-CM | POA: Insufficient documentation

## 2021-03-29 DIAGNOSIS — S32030A Wedge compression fracture of third lumbar vertebra, initial encounter for closed fracture: Secondary | ICD-10-CM | POA: Diagnosis not present

## 2021-03-29 DIAGNOSIS — Z7982 Long term (current) use of aspirin: Secondary | ICD-10-CM | POA: Insufficient documentation

## 2021-03-29 DIAGNOSIS — M8088XA Other osteoporosis with current pathological fracture, vertebra(e), initial encounter for fracture: Secondary | ICD-10-CM | POA: Diagnosis not present

## 2021-03-29 DIAGNOSIS — Z419 Encounter for procedure for purposes other than remedying health state, unspecified: Secondary | ICD-10-CM

## 2021-03-29 DIAGNOSIS — Z7902 Long term (current) use of antithrombotics/antiplatelets: Secondary | ICD-10-CM | POA: Insufficient documentation

## 2021-03-29 DIAGNOSIS — Z9889 Other specified postprocedural states: Secondary | ICD-10-CM | POA: Diagnosis not present

## 2021-03-29 HISTORY — DX: Other specified postprocedural states: R11.2

## 2021-03-29 HISTORY — PX: KYPHOPLASTY: SHX5884

## 2021-03-29 HISTORY — DX: Other specified postprocedural states: Z98.890

## 2021-03-29 LAB — BASIC METABOLIC PANEL
Anion gap: 10 (ref 5–15)
BUN: 17 mg/dL (ref 8–23)
CO2: 25 mmol/L (ref 22–32)
Calcium: 8.8 mg/dL — ABNORMAL LOW (ref 8.9–10.3)
Chloride: 102 mmol/L (ref 98–111)
Creatinine, Ser: 0.91 mg/dL (ref 0.44–1.00)
GFR, Estimated: >60 mL/min (ref >60)
Glucose, Bld: 104 mg/dL — ABNORMAL HIGH (ref 70–99)
Potassium: 4.4 mmol/L (ref 3.5–5.1)
Sodium: 137 mmol/L (ref 135–145)

## 2021-03-29 LAB — CBC
HCT: 38.1 % (ref 36.0–46.0)
Hemoglobin: 12 g/dL (ref 12.0–15.0)
MCH: 28.5 pg (ref 26.0–34.0)
MCHC: 31.5 g/dL (ref 30.0–36.0)
MCV: 90.5 fL (ref 80.0–100.0)
Platelets: 226 10*3/uL (ref 150–400)
RBC: 4.21 MIL/uL (ref 3.87–5.11)
RDW: 13.2 % (ref 11.5–15.5)
WBC: 8.6 10*3/uL (ref 4.0–10.5)
nRBC: 0 % (ref 0.0–0.2)

## 2021-03-29 LAB — SURGICAL PCR SCREEN
MRSA, PCR: NEGATIVE
Staphylococcus aureus: POSITIVE — AB

## 2021-03-29 SURGERY — KYPHOPLASTY
Anesthesia: General | Site: Spine Lumbar

## 2021-03-29 MED ORDER — LIDOCAINE-EPINEPHRINE 1 %-1:100000 IJ SOLN
INTRAMUSCULAR | Status: AC
  Filled 2021-03-29: qty 1

## 2021-03-29 MED ORDER — ROCURONIUM BROMIDE 10 MG/ML (PF) SYRINGE
PREFILLED_SYRINGE | INTRAVENOUS | Status: AC
  Filled 2021-03-29: qty 10

## 2021-03-29 MED ORDER — PHENYLEPHRINE 40 MCG/ML (10ML) SYRINGE FOR IV PUSH (FOR BLOOD PRESSURE SUPPORT)
PREFILLED_SYRINGE | INTRAVENOUS | Status: AC
  Filled 2021-03-29: qty 10

## 2021-03-29 MED ORDER — CHLORHEXIDINE GLUCONATE CLOTH 2 % EX PADS: 6.0000 | MEDICATED_PAD | Freq: Once | CUTANEOUS | Status: DC

## 2021-03-29 MED ORDER — LIDOCAINE 2% (20 MG/ML) 5 ML SYRINGE
INTRAMUSCULAR | Status: DC | PRN
  Administered 2021-03-29: 80 mg via INTRAVENOUS

## 2021-03-29 MED ORDER — PROPOFOL 10 MG/ML IV BOLUS
INTRAVENOUS | Status: AC
  Filled 2021-03-29: qty 20

## 2021-03-29 MED ORDER — IOPAMIDOL (ISOVUE-300) INJECTION 61%
INTRAVENOUS | Status: DC | PRN
Start: 2021-03-29 — End: 2021-03-29
  Administered 2021-03-29: 100 mL

## 2021-03-29 MED ORDER — CEFAZOLIN SODIUM-DEXTROSE 2-4 GM/100ML-% IV SOLN
2.0000 g | INTRAVENOUS | Status: AC
  Administered 2021-03-29: 2 g via INTRAVENOUS

## 2021-03-29 MED ORDER — CHLORHEXIDINE GLUCONATE 0.12 % MT SOLN
OROMUCOSAL | Status: AC
  Administered 2021-03-29: 15 mL via OROMUCOSAL
  Filled 2021-03-29: qty 15

## 2021-03-29 MED ORDER — FENTANYL CITRATE (PF) 100 MCG/2ML IJ SOLN
INTRAMUSCULAR | Status: DC | PRN
  Administered 2021-03-29 (×2): 50 ug via INTRAVENOUS

## 2021-03-29 MED ORDER — LIDOCAINE-EPINEPHRINE 1 %-1:100000 IJ SOLN
INTRAMUSCULAR | Status: DC | PRN
Start: 2021-03-29 — End: 2021-03-29
  Administered 2021-03-29: 13 mL

## 2021-03-29 MED ORDER — SUGAMMADEX SODIUM 200 MG/2ML IV SOLN
INTRAVENOUS | Status: DC | PRN
  Administered 2021-03-29: 200 mg via INTRAVENOUS

## 2021-03-29 MED ORDER — ONDANSETRON HCL 4 MG/2ML IJ SOLN
INTRAMUSCULAR | Status: DC | PRN
  Administered 2021-03-29: 4 mg via INTRAVENOUS

## 2021-03-29 MED ORDER — FENTANYL CITRATE (PF) 250 MCG/5ML IJ SOLN
INTRAMUSCULAR | Status: AC
  Filled 2021-03-29: qty 5

## 2021-03-29 MED ORDER — CHLORHEXIDINE GLUCONATE 0.12 % MT SOLN: 15.0000 mL | Freq: Once | OROMUCOSAL | Status: AC

## 2021-03-29 MED ORDER — DEXAMETHASONE SODIUM PHOSPHATE 10 MG/ML IJ SOLN
INTRAMUSCULAR | Status: AC
  Filled 2021-03-29: qty 1

## 2021-03-29 MED ORDER — CEFAZOLIN SODIUM-DEXTROSE 2-4 GM/100ML-% IV SOLN
INTRAVENOUS | Status: AC
  Filled 2021-03-29: qty 100

## 2021-03-29 MED ORDER — LACTATED RINGERS IV SOLN: INTRAVENOUS | Status: DC

## 2021-03-29 MED ORDER — PROPOFOL 10 MG/ML IV BOLUS
INTRAVENOUS | Status: DC | PRN
  Administered 2021-03-29: 110 mg via INTRAVENOUS

## 2021-03-29 MED ORDER — MIDAZOLAM HCL 2 MG/2ML IJ SOLN
INTRAMUSCULAR | Status: AC
  Filled 2021-03-29: qty 2

## 2021-03-29 MED ORDER — PHENYLEPHRINE 40 MCG/ML (10ML) SYRINGE FOR IV PUSH (FOR BLOOD PRESSURE SUPPORT)
PREFILLED_SYRINGE | INTRAVENOUS | Status: DC | PRN
  Administered 2021-03-29: 160 ug via INTRAVENOUS
  Administered 2021-03-29 (×2): 80 ug via INTRAVENOUS

## 2021-03-29 MED ORDER — MIDAZOLAM HCL 2 MG/2ML IJ SOLN
INTRAMUSCULAR | Status: DC | PRN
  Administered 2021-03-29: 2 mg via INTRAVENOUS

## 2021-03-29 MED ORDER — ROCURONIUM BROMIDE 10 MG/ML (PF) SYRINGE
PREFILLED_SYRINGE | INTRAVENOUS | Status: DC | PRN
  Administered 2021-03-29: 50 mg via INTRAVENOUS

## 2021-03-29 MED ORDER — DEXAMETHASONE SODIUM PHOSPHATE 10 MG/ML IJ SOLN
INTRAMUSCULAR | Status: DC | PRN
  Administered 2021-03-29: 8 mg via INTRAVENOUS

## 2021-03-29 MED ORDER — BUPIVACAINE HCL (PF) 0.5 % IJ SOLN
INTRAMUSCULAR | Status: AC
  Filled 2021-03-29: qty 30

## 2021-03-29 MED ORDER — ONDANSETRON HCL 4 MG/2ML IJ SOLN
INTRAMUSCULAR | Status: AC
  Filled 2021-03-29: qty 2

## 2021-03-29 MED ORDER — BUPIVACAINE HCL (PF) 0.5 % IJ SOLN
INTRAMUSCULAR | Status: DC | PRN
Start: 2021-03-29 — End: 2021-03-29
  Administered 2021-03-29: 13 mL

## 2021-03-29 MED ORDER — ORAL CARE MOUTH RINSE: 15.0000 mL | Freq: Once | OROMUCOSAL | Status: AC

## 2021-03-29 MED ORDER — 0.9 % SODIUM CHLORIDE (POUR BTL) OPTIME
TOPICAL | Status: DC | PRN
Start: 2021-03-29 — End: 2021-03-29
  Administered 2021-03-29 (×2): 1000 mL

## 2021-03-29 SURGICAL SUPPLY — 42 items
ADH SKN CLS APL DERMABOND .7 (GAUZE/BANDAGES/DRESSINGS) ×1
BAG COUNTER SPONGE SURGICOUNT (BAG) ×2 IMPLANT
BAG SPNG CNTER NS LX DISP (BAG) ×1
BLADE CLIPPER SURG (BLADE) IMPLANT
BLADE SURG 11 STRL SS (BLADE) ×2 IMPLANT
BNDG ADH 1X3 SHEER STRL LF (GAUZE/BANDAGES/DRESSINGS) ×8 IMPLANT
BNDG ADH THN 3X1 STRL LF (GAUZE/BANDAGES/DRESSINGS) ×4
CEMENT KYPHON C01A KIT/MIXER (Cement) ×1 IMPLANT
CONT SPEC 4OZ CLIKSEAL STRL BL (MISCELLANEOUS) ×2 IMPLANT
DECANTER SPIKE VIAL GLASS SM (MISCELLANEOUS) ×1 IMPLANT
DERMABOND ADVANCED (GAUZE/BANDAGES/DRESSINGS) ×1
DERMABOND ADVANCED .7 DNX12 (GAUZE/BANDAGES/DRESSINGS) IMPLANT
DRAPE C-ARM 42X72 X-RAY (DRAPES) ×2 IMPLANT
DRAPE HALF SHEET 40X57 (DRAPES) ×2 IMPLANT
DRAPE INCISE IOBAN 66X45 STRL (DRAPES) ×2 IMPLANT
DRAPE LAPAROTOMY 100X72X124 (DRAPES) ×2 IMPLANT
DRAPE WARM FLUID 44X44 (DRAPES) ×2 IMPLANT
DURAPREP 26ML APPLICATOR (WOUND CARE) ×2 IMPLANT
GAUZE 4X4 16PLY ~~LOC~~+RFID DBL (SPONGE) ×2 IMPLANT
GLOVE EXAM NITRILE XL STR (GLOVE) IMPLANT
GLOVE SURG LTX SZ8.5 (GLOVE) ×2 IMPLANT
GLOVE SURG UNDER POLY LF SZ8.5 (GLOVE) ×2 IMPLANT
GOWN STRL REUS W/ TWL LRG LVL3 (GOWN DISPOSABLE) IMPLANT
GOWN STRL REUS W/ TWL XL LVL3 (GOWN DISPOSABLE) ×1 IMPLANT
GOWN STRL REUS W/TWL 2XL LVL3 (GOWN DISPOSABLE) ×2 IMPLANT
GOWN STRL REUS W/TWL LRG LVL3 (GOWN DISPOSABLE)
GOWN STRL REUS W/TWL XL LVL3 (GOWN DISPOSABLE) ×2
KIT BASIN OR (CUSTOM PROCEDURE TRAY) ×2 IMPLANT
KIT TURNOVER KIT B (KITS) ×2 IMPLANT
NDL HYPO 25X1 1.5 SAFETY (NEEDLE) ×1 IMPLANT
NDL SPNL 22GX3.5 QUINCKE BK (NEEDLE) IMPLANT
NEEDLE HYPO 25X1 1.5 SAFETY (NEEDLE) ×2 IMPLANT
NEEDLE SPNL 22GX3.5 QUINCKE BK (NEEDLE) ×2 IMPLANT
NS IRRIG 1000ML POUR BTL (IV SOLUTION) ×2 IMPLANT
PACK SURGICAL SETUP 50X90 (CUSTOM PROCEDURE TRAY) ×2 IMPLANT
PAD ARMBOARD 7.5X6 YLW CONV (MISCELLANEOUS) ×6 IMPLANT
SPECIMEN JAR SMALL (MISCELLANEOUS) IMPLANT
SUT VICRYL RAPIDE 4/0 PS 2 (SUTURE) ×2 IMPLANT
SYR CONTROL 10ML LL (SYRINGE) ×4 IMPLANT
TOWEL GREEN STERILE (TOWEL DISPOSABLE) ×2 IMPLANT
TOWEL GREEN STERILE FF (TOWEL DISPOSABLE) ×2 IMPLANT
TRAY KYPHOPAK 20/3 ONESTEP 1ST (MISCELLANEOUS) ×1 IMPLANT

## 2021-03-29 NOTE — Transfer of Care (Signed)
Immediate Anesthesia Transfer of Care Note  Patient: [name] D Mey  Procedure(s) Performed: Lumbar Three KYPHOPLASTY (Spine Lumbar)  Patient Location: PACU  Anesthesia Type:General  Level of Consciousness: awake and oriented  Airway & Oxygen Therapy: Patient Spontanous Breathing and Patient connected to face mask oxygen  Post-op Assessment: Report given to RN and Patient moving all extremities X 4  Post vital signs: Reviewed and stable  Last Vitals:  Vitals Value Taken Time  BP 145/56 03/29/21 1710  Temp    Pulse 75 03/29/21 1711  Resp 16 03/29/21 1711  SpO2 98 % 03/29/21 1711  Vitals shown include unvalidated device data.  Last Pain:  Vitals:   03/29/21 1321  TempSrc:   PainSc: 10-Worst pain ever      Patients Stated Pain Goal: 2 (87/86/76 7209)  Complications: No notable events documented.

## 2021-03-29 NOTE — Progress Notes (Signed)
Dr. Ellene Route made aware that patient took Plavix this morning. Ok to proceed per Dr. Ellene Route.

## 2021-03-29 NOTE — H&P (Signed)
Laurie [name] is an 66 y.o. female.   Chief Complaint: Back pain, you L3 superior endplate compression fracture HPI: Laurie [name] is a 66 year old individual whose had significant cervical spondylitic disease in the past in addition to have adding a lumbar radiculopathy secondary to herniated disc years ago.  She had developed the acute onset of severe back pain after a fall from standing height this past Monday.  She notes that she fell backwards onto her  linoleum floor and felt instantaneous pain in her back without radiation into her lower extremities.  Pain persisted and she was seen in the emergency department where a CT scan and plain x-rays demonstrate the superior endplate compression fracture of L3 with about 20% loss of the ventral aspect of the height of the vertebral body.  She contacted our office and I reviewed the films and discussed the situation with her she is in dire pain with any type of movement and is having great difficulty with her activities of daily living.  She does feel her strength is intact in the lower extremities.  I have advised that acrylic balloon kyphoplasty and she is now being admitted for that.  Past Medical History:  Diagnosis Date   Allergy    Anxiety    Arthritis    RA   Asthma    Chronic back pain    COPD (chronic obstructive pulmonary disease) (HCC)    Depression    Esophageal stricture    GERD (gastroesophageal reflux disease)    Heart murmur    Hematemesis 09/10/2018   Hyperlipidemia    Hypertension    IBS (irritable bowel syndrome)    Interstitial cystitis    Neuromuscular disorder ([place])    fibromyalgia    Past Surgical History:  Procedure Laterality Date   ABDOMINAL HYSTERECTOMY     APPENDECTOMY     BIOPSY  09/11/2018   Procedure: BIOPSY;  Surgeon: Irving Copas., MD;  Location: [facility];  Service: Gastroenterology;;   CERVICAL FUSION     CERVICAL LAMINECTOMY     CORONARY STENT INTERVENTION N/A 10/07/2019    Procedure: CORONARY STENT INTERVENTION;  Surgeon: Burnell Blanks, MD;  Location: [facility];  Service: Cardiovascular;  Laterality: N/A;   ESOPHAGOGASTRODUODENOSCOPY     ESOPHAGOGASTRODUODENOSCOPY (EGD) WITH PROPOFOL N/A 09/11/2018   Procedure: ESOPHAGOGASTRODUODENOSCOPY (EGD) WITH PROPOFOL;  Surgeon: Rush Landmark Telford Nab., MD;  Location: [facility];  Service: Gastroenterology;  Laterality: N/A;   FINGER SURGERY     LEFT HEART CATH AND CORONARY ANGIOGRAPHY N/A 10/07/2019   Procedure: LEFT HEART CATH AND CORONARY ANGIOGRAPHY;  Surgeon: Burnell Blanks, MD;  Location: [facility];  Service: Cardiovascular;  Laterality: N/A;   LUMBAR FUSION     SAVORY DILATION N/A 09/11/2018   Procedure: SAVORY DILATION;  Surgeon: Rush Landmark Telford Nab., MD;  Location: [facility];  Service: Gastroenterology;  Laterality: N/A;   TONSILLECTOMY      Family History  Problem Relation Age of Onset   Dementia Mother    Heart attack Father    Coronary artery disease Father    Cancer Brother        esophageal   Esophageal cancer Brother    Coronary artery disease Paternal Aunt    Stomach cancer Paternal Aunt    Coronary artery disease Paternal Grandmother    Aneurysm Brother        aortic   Rectal cancer Neg Hx    Colon cancer Neg Hx    Thyroid disease  Neg Hx    Social History:  reports that she has been smoking cigarettes. She has been smoking an average of .5 packs per day. She has never used smokeless tobacco. She reports current alcohol use. She reports current drug use. Drug: Marijuana.  Allergies:  Allergies  Allergen Reactions   Codeine Nausea And Vomiting   Lisinopril Cough   Sulfonamide Derivatives Nausea And Vomiting   Tetracyclines & Related Rash    Medications Prior to Admission  Medication Sig Dispense Refill   albuterol (VENTOLIN HFA) 108 (90 Base) MCG/ACT inhaler Inhale TWO puffs into THE lungs every SIX hours as needed FOR wheezing OR shortness of  breath 8.5 g 3   aspirin EC 81 MG tablet Take 1 tablet (81 mg total) by mouth daily. Swallow whole. 90 tablet 3   atorvastatin (LIPITOR) 40 MG tablet Take 1 tablet (40 mg total) by mouth every evening. Pt needs to make appt with provider before further refills - 1st attempt 90 tablet 0   Blood Pressure Monitoring DEVI Use as needed DX I10 1 each 1   clopidogrel (PLAVIX) 75 MG tablet Take 1 tablet (75 mg total) by mouth every morning. Pt needs to make appt with provider before further refills - 1st attempt 30 tablet 0   fluticasone (FLONASE) 50 MCG/ACT nasal spray INSTILL 2 SPRAYS IN EACH NOSTRIL ONCE DAILY 16 g 6   furosemide (LASIX) 20 MG tablet Take one tablet by mouth daily as needed for leg edema. 30 tablet 1   guaiFENesin (MUCINEX) 600 MG 12 hr tablet Take 600 mg by mouth 2 (two) times daily as needed.     losartan (COZAAR) 50 MG tablet TAKE ONE TABLET BY MOUTH EVERY MORNING 90 tablet 3   Menthol, Topical Analgesic, (BIOFREEZE EX) Apply 1 application topically daily as needed (back pain).     methimazole (TAPAZOLE) 10 MG tablet TAKE TWO TABLETS BY MOUTH EVERY EVENING 180 tablet 1   metoprolol succinate (TOPROL-XL) 50 MG 24 hr tablet Take 1 tablet (50 mg total) by mouth daily. Take with or immediately following a meal. 90 tablet 3   mupirocin ointment (BACTROBAN) 2 % Apply 1 application topically 2 (two) times daily. 22 g 0   nitroGLYCERIN (NITROSTAT) 0.4 MG SL tablet Place 1 tablet (0.4 mg total) under the tongue every 5 (five) minutes as needed for chest pain. 25 tablet 1   ondansetron (ZOFRAN ODT) 8 MG disintegrating tablet Take 1 tablet (8 mg total) by mouth every 8 (eight) hours as needed for nausea or vomiting. 15 tablet 0   oxyCODONE-acetaminophen (PERCOCET) 5-325 MG tablet Take 1-2 tablets by mouth every 4 (four) hours as needed for moderate pain. 20 tablet 0   pantoprazole (PROTONIX) 40 MG tablet TAKE ONE TABLET BY MOUTH EVERY MORNING 90 tablet 1   venlafaxine XR (EFFEXOR-XR) 150 MG 24  hr capsule TAKE ONE TABLET BY MOUTH EVERY MORNING 30 capsule 1    No results found for this or any previous visit (from the past 28 hour(s)). No results found.  Review of Systems  Musculoskeletal:  Positive for back pain.  Neurological:  Positive for light-headedness.  All other systems reviewed and are negative.  There were no vitals taken for this visit. Physical Exam Constitutional:      Appearance: Normal appearance.  HENT:     Head: Normocephalic and atraumatic.     Right Ear: Tympanic membrane normal.     Left Ear: Tympanic membrane normal.     Nose: Nose normal.  Mouth/Throat:     Mouth: Mucous membranes are moist.  Eyes:     Extraocular Movements: Extraocular movements intact.     Pupils: Pupils are equal, round, and reactive to light.  Cardiovascular:     Rate and Rhythm: Normal rate and regular rhythm.     Pulses: Normal pulses.     Heart sounds: Normal heart sounds.  Pulmonary:     Effort: Pulmonary effort is normal.     Breath sounds: Normal breath sounds.  Abdominal:     General: Abdomen is flat. Bowel sounds are normal.     Palpations: Abdomen is soft.  Musculoskeletal:     Cervical back: Normal range of motion and neck supple.     Comments: Marked tenderness in the midportion of the lumbar spine to palpation and percussion.  Positive straight leg raising at 60 degrees in either lower extremity.  Patrick's maneuver is negative  Skin:    General: Skin is warm and dry.     Capillary Refill: Capillary refill takes less than 2 seconds.  Neurological:     Mental Status: She is alert.     Comments: Cranial nerve examination is normal.  Upper extremity strength is normal deltoids biceps triceps grips and intrinsics.  Preserved reflexes in the biceps triceps and brachioradialis.  Absent reflexes in the patella.  1+ reflex in the Achilles.  Lower extremity strength is intact in iliopsoas quadriceps tibialis anterior and gastrocs to confrontational testing.  Patellar  reflexes are absent  Psychiatric:        Mood and Affect: Mood normal.        Behavior: Behavior normal.        Thought Content: Thought content normal.        Judgment: Judgment normal.     Assessment/Plan L3 compression fracture.  Plan: L3 acrylic balloon kyphoplasty.  Earleen Newport, MD 03/29/2021, 1:03 PM

## 2021-03-29 NOTE — Anesthesia Procedure Notes (Signed)
Procedure Name: Intubation Date/Time: 03/29/2021 4:13 PM Performed by: Barrington Ellison, CRNA Pre-anesthesia Checklist: Patient identified, Emergency Drugs available, Suction available and Patient being monitored Patient Re-evaluated:Patient Re-evaluated prior to induction Oxygen Delivery Method: Circle System Utilized Preoxygenation: Pre-oxygenation with 100% oxygen Induction Type: IV induction Ventilation: Mask ventilation without difficulty Laryngoscope Size: Mac and 3 Grade View: Grade I Tube type: Oral Tube size: 7.0 mm Number of attempts: 1 Airway Equipment and Method: Stylet and Oral airway Placement Confirmation: ETT inserted through vocal cords under direct vision, positive ETCO2 and breath sounds checked- equal and bilateral Secured at: 21 cm Tube secured with: Tape Dental Injury: Teeth and Oropharynx as per pre-operative assessment

## 2021-03-29 NOTE — Op Note (Signed)
Date of surgery: 03/29/2021 Preoperative diagnosis: L3 compression fracture Post operative diagnosis: L3 compression fracture Procedure: L3 acrylic balloon kyphoplasty Surgeon: Mallie Mussel Fleming Prill,MD Indications: The patient is a 66 year old individual whose had severe onset of pain after a fall from standing height.  She has a superior endplate fracture of 71% at the L3 vertebrae.  She has been in excruciating pain despite narcotic pain management.  Procedure: The patient was brought to the operating room supine on a stretcher. After the smooth induction of general endotracheal anesthesia the patient was carefully positioned in the prone position on the operating table with the bony prominences appropriately padded and protected.  Parallel rolls were used under the patient's chest back was prepped with alcohol and DuraPrep and draped in a sterile fashion and then biplane fluoroscopy was used to isolate the L3 vertebrae. A pedicle entry site for this her vertebrae was chosen 4 cm lateral to the pedicle at the 9:00 position on the left and 3 o'clock position on the right.  A Jamshidi needle was inserted into the pedicle via the transfer radicular transvertebral approach. Biplane fluoroscopy was used during this procedure.  When the cannula was in the vertebral body the inner cannula was removed and a bone drill was used to create a space for the balloon. Balloon was then inflated 250 mmHg and was noted to deteriorate to 80 millimeters of pressure.  3 cc of dye was injected into the balloon.  Cement was mixed to appropriate consistency and then injected into the vertebral body under biplane fluoroscopy until complete filling was achieved. A total of 3 cc of cement was injected on the left and 3 cc was injected on the right.  Final fluoroscopic images were obtained the Jamshidi needle was removed the incision was closed with a singular 3-0 Vicryl stitch and the patient was then returned to the recovery room in stable  condition.

## 2021-03-29 NOTE — Anesthesia Preprocedure Evaluation (Addendum)
Anesthesia Evaluation  Patient identified by MRN, date of birth, ID band Patient awake    Reviewed: Allergy & Precautions, NPO status , Patient's Chart, lab work & pertinent test results  History of Anesthesia Complications (+) PONV and history of anesthetic complications  Airway Mallampati: II  TM Distance: >3 FB Neck ROM: Full    Dental  (+) Dental Advisory Given, Poor Dentition, Missing   Pulmonary shortness of breath, asthma , COPD,  COPD inhaler, Current Smoker,    Pulmonary exam normal breath sounds clear to auscultation       Cardiovascular hypertension, Pt. on medications + angina + CAD and + Cardiac Stents  + Valvular Problems/Murmurs  Rhythm:Regular Rate:Normal + Systolic murmurs LHC 2/83/1517 ? Mid RCA lesion is 50% stenosed. ? 2nd Mrg lesion is 80% stenosed. ? Mid Cx lesion is 90% stenosed. ? Prox LAD lesion is 40% stenosed. ? A drug-eluting stent was successfully placed using a [place] [id number]. ? Post intervention, there is a 0% residual stenosis. ? A drug-eluting stent was successfully placed using a [place] [id number]. ? Post intervention, there is a 0% residual stenosis. ? The left ventricular systolic function is normal. ? LV end diastolic pressure is normal. ? There is no mitral valve regurgitation. ? The left ventricular ejection fraction is 55-65% by visual estimate.   1. Mild non-obstructive disease in the proximal to mid LAD 2. Severe stenosis mid Circumflex into OM1 3. Successful PTCA/DES x 2 Mid Circumflex into OM1 4. Moderate non-obstructive disease in the mid RCA 5. Normal LV systolic function.    Echo 2019 - Left ventricle: The cavity size was normal. There was mild concentric hypertrophy. Systolic function was vigorous. The estimated ejection fraction was in the range of 65% to 70%. Wall motion was normal; there were no regional wall motion abnormalities. Left ventricular  diastolic function parameters were normal.  - Aortic valve: Valve area (VTI): 2.38 cm^2. Valve area (Vmax):  2.41 cm^2. Valve area (Vmean): 2.45 cm^2.  - Aortic root: The aortic root was normal in size.  - Mitral valve: Structurally normal valve. There was mild  regurgitation.  - Left atrium: The atrium was at the upper limits of normal in size.  - Right ventricle: The cavity size was normal. Wall thickness was normal. Systolic function was normal.  - Right atrium: The atrium was normal in size.  - Tricuspid valve: There was mild regurgitation.  - Pulmonary arteries: Systolic pressure was within the normal range.  - Inferior vena cava: The vessel was normal in size.  - Pericardium, extracardiac: There was no pericardial effusion.    Neuro/Psych PSYCHIATRIC DISORDERS Anxiety Depression she has been taking fentanyl which is not prescribed for her for her right lower back pain  May be experiencing withdrawal from fentanyl  Neuromuscular disease    GI/Hepatic Neg liver ROS, GERD  ,  Endo/Other  Hyperthyroidism   Renal/GU Renal disease     Musculoskeletal  (+) Arthritis , Rheumatoid disorders,  Fibromyalgia -  Abdominal   Peds  Hematology negative hematology ROS (+)   Anesthesia Other Findings   Reproductive/Obstetrics negative OB ROS                           Anesthesia Physical  Anesthesia Plan  ASA: 3  Anesthesia Plan: General   Post-op Pain Management: Minimal or no pain anticipated and Ofirmev IV (intra-op)*   Induction: Intravenous  PONV Risk Score and Plan: 4  or greater and Ondansetron, Dexamethasone, Treatment may vary due to age or medical condition and Midazolam  Airway Management Planned: Oral ETT  Additional Equipment:   Intra-op Plan:   Post-operative Plan: Extubation in OR  Informed Consent: I have reviewed the patients History and Physical, chart, labs and discussed the procedure including the risks, benefits and  alternatives for the proposed anesthesia with the patient or authorized representative who has indicated his/her understanding and acceptance.     Dental advisory given  Plan Discussed with: CRNA  Anesthesia Plan Comments: (Patient took plavix this am. Discussed with Dr. Ellene Route. He stated he was aware and wished to proceed.)      Anesthesia Quick Evaluation

## 2021-03-29 NOTE — Discharge Summary (Signed)
Physician Discharge Summary  Patient ID: Laurie [name] MRN: [id number] DOB/AGE: 08-08-1955 66 y.o.  Admit date: 03/29/2021 Discharge date: 03/29/2021  Admission Diagnoses: L3 compression fracture  Discharge Diagnoses: L3 compression fracture Principal Problem:   Fracture, lumbar vertebra, compression [facility])   Discharged Condition: good  Hospital Course: Patient was admitted to undergo acrylic balloon kyphoplasty which he tolerated well.  Consults: None  Significant Diagnostic Studies: None  Treatments: surgery: Acrylic balloon kyphoplasty L3  Discharge Exam: Blood pressure (!) 142/93, pulse 60, temperature 98.2 F (36.8 C), resp. rate 16, height 5\' 10"  (1.778 m), weight 81.6 kg, SpO2 99 %. Incision is clean and dry motor function is intact  Disposition: Discharge disposition: 01-Home or Self Care       Discharge Instructions     Call MD for:  redness, tenderness, or signs of infection (pain, swelling, redness, odor or green/yellow discharge around incision site)   Complete by: As directed    Call MD for:  severe uncontrolled pain   Complete by: As directed    Call MD for:  temperature >100.4   Complete by: As directed    Diet - low sodium heart healthy   Complete by: As directed    Discharge wound care:   Complete by: As directed    Okay to shower. Do not apply salves or appointments to incision. No heavy lifting with the upper extremities greater than 10 pounds. May resume driving when not requiring pain medication and patient feels comfortable with doing so.   Incentive spirometry RT   Complete by: As directed    Increase activity slowly   Complete by: As directed       Allergies as of 03/29/2021       Reactions   Codeine Nausea And Vomiting   Lisinopril Cough   Sulfonamide Derivatives Nausea And Vomiting   Tetracyclines & Related Rash        Medication List     TAKE these medications    albuterol 108 (90 Base) MCG/ACT inhaler Commonly known  as: VENTOLIN HFA Inhale TWO puffs into THE lungs every SIX hours as needed FOR wheezing OR shortness of breath   aspirin EC 81 MG tablet Take 1 tablet (81 mg total) by mouth daily. Swallow whole.   atorvastatin 40 MG tablet Commonly known as: LIPITOR Take 1 tablet (40 mg total) by mouth every evening. Pt needs to make appt with provider before further refills - 1st attempt   [place] 1 application topically daily as needed (back pain).   Blood Pressure Monitoring Devi Use as needed DX I10   clopidogrel 75 MG tablet Commonly known as: PLAVIX Take 1 tablet (75 mg total) by mouth every morning. Pt needs to make appt with provider before further refills - 1st attempt   fluticasone 50 MCG/ACT nasal spray Commonly known as: FLONASE INSTILL 2 SPRAYS IN EACH NOSTRIL ONCE DAILY   furosemide 20 MG tablet Commonly known as: LASIX Take one tablet by mouth daily as needed for leg edema.   guaiFENesin 600 MG 12 hr tablet Commonly known as: MUCINEX Take 600 mg by mouth 2 (two) times daily as needed.   losartan 50 MG tablet Commonly known as: COZAAR TAKE ONE TABLET BY MOUTH EVERY MORNING   methimazole 10 MG tablet Commonly known as: TAPAZOLE TAKE TWO TABLETS BY MOUTH EVERY EVENING   metoprolol succinate 50 MG 24 hr tablet Commonly known as: TOPROL-XL Take 1 tablet (50 mg total) by mouth daily. Take with or  immediately following a meal.   mupirocin ointment 2 % Commonly known as: BACTROBAN Apply 1 application topically 2 (two) times daily.   nitroGLYCERIN 0.4 MG SL tablet Commonly known as: NITROSTAT Place 1 tablet (0.4 mg total) under the tongue every 5 (five) minutes as needed for chest pain.   ondansetron 8 MG disintegrating tablet Commonly known as: Zofran ODT Take 1 tablet (8 mg total) by mouth every 8 (eight) hours as needed for nausea or vomiting.   oxyCODONE-acetaminophen 5-325 MG tablet Commonly known as: Percocet Take 1-2 tablets by mouth every 4 (four)  hours as needed for moderate pain.   pantoprazole 40 MG tablet Commonly known as: PROTONIX TAKE ONE TABLET BY MOUTH EVERY MORNING   venlafaxine XR 150 MG 24 hr capsule Commonly known as: EFFEXOR-XR TAKE ONE TABLET BY MOUTH EVERY MORNING               Discharge Care Instructions  (From admission, onward)           Start     Ordered   03/29/21 0000  Discharge wound care:       Comments: Okay to shower. Do not apply salves or appointments to incision. No heavy lifting with the upper extremities greater than 10 pounds. May resume driving when not requiring pain medication and patient feels comfortable with doing so.   03/29/21 1759             Signed: Blanchie Dessert Thoren Hosang 03/29/2021, 6:00 PM

## 2021-03-30 ENCOUNTER — Other Ambulatory Visit: Payer: Self-pay | Admitting: Cardiovascular Disease

## 2021-03-30 ENCOUNTER — Encounter (HOSPITAL_COMMUNITY): Payer: Self-pay | Admitting: Neurological Surgery

## 2021-03-30 ENCOUNTER — Other Ambulatory Visit: Payer: Self-pay | Admitting: Endocrinology

## 2021-03-30 ENCOUNTER — Other Ambulatory Visit: Payer: Self-pay | Admitting: Family Medicine

## 2021-03-30 NOTE — Anesthesia Postprocedure Evaluation (Signed)
Anesthesia Post Note  Patient: Laurie [name]  Procedure(s) Performed: Lumbar Three KYPHOPLASTY (Spine Lumbar)     Patient location during evaluation: PACU Anesthesia Type: General Level of consciousness: sedated and patient cooperative Pain management: pain level controlled Vital Signs Assessment: post-procedure vital signs reviewed and stable Respiratory status: spontaneous breathing Cardiovascular status: stable Anesthetic complications: no   No notable events documented.  Last Vitals:  Vitals:   03/29/21 1726 03/29/21 1744  BP: (!) 141/62 (!) 142/93  Pulse: 75 60  Resp: 16   Temp:  36.8 C  SpO2: (!) 89% 99%    Last Pain:  Vitals:   03/29/21 1744  TempSrc:   PainSc: 0-No pain                 Nolon Nations

## 2021-04-02 ENCOUNTER — Telehealth: Payer: Self-pay | Admitting: Pharmacist

## 2021-04-02 NOTE — Chronic Care Management (AMB) (Signed)
Chronic Care Management Pharmacy Assistant   Name: Laurie [name]  MRN: [id number] DOB: 10/29/1955  Reason for Encounter: Medication Review / Medication Coordination Call   Conditions to be addressed/monitored: HTN  Recent office visits:  None  Recent consult visits:  None  Hospital visits:  Medication Reconciliation was completed by comparing discharge summary, patients EMR and Pharmacy list, and upon discussion with patient.  Patient was seen at [facility] ED on 03/27/2021 for 3 hour due to fall in home.  New?Medications Started at [facility] Discharge:?? oxyCODONE-acetaminophen 5-325 MG tablet Take 1-2 tablets by mouth every 4 (four) hours as needed for moderate pain. Medication Changes at Hospital Discharge: No medication changes Medications Discontinued at Hospital Discharge: No medications discontinued Medications that remain the same after Hospital Discharge:??  -All other medications will remain the same.    Medications: Outpatient Encounter Medications as of 04/02/2021  Medication Sig   albuterol (VENTOLIN HFA) 108 (90 Base) MCG/ACT inhaler INHALE TWO PUFFS BY MOUTH INTO LUNGS every SIX hours AS NEEDED FOR WHEEZING AND/OR SHORTNESS OF BREATH   aspirin EC 81 MG tablet Take 1 tablet (81 mg total) by mouth daily. Swallow whole.   atorvastatin (LIPITOR) 40 MG tablet Take 1 tablet (40 mg total) by mouth every evening. Pt needs to make appt with provider before further refills - 1st attempt   Blood Pressure Monitoring DEVI Use as needed DX I10   clopidogrel (PLAVIX) 75 MG tablet Take 1 tablet (75 mg total) by mouth daily. Please make overdue appt with Dr. Angelena Form before anymore refills. Thank you 2nd attempt   fluticasone (FLONASE) 50 MCG/ACT nasal spray INSTILL 2 SPRAYS IN EACH NOSTRIL ONCE DAILY   furosemide (LASIX) 20 MG tablet TAKE ONE TABLET BY MOUTH daily AS NEEDED FOR LEG EDEMA   guaiFENesin (MUCINEX) 600 MG 12 hr tablet Take 600 mg by mouth 2 (two)  times daily as needed.   losartan (COZAAR) 50 MG tablet TAKE ONE TABLET BY MOUTH EVERY MORNING   Menthol, Topical Analgesic, (BIOFREEZE EX) Apply 1 application topically daily as needed (back pain).   methimazole (TAPAZOLE) 10 MG tablet TAKE TWO TABLETS BY MOUTH EVERY EVENING   metoprolol succinate (TOPROL-XL) 50 MG 24 hr tablet TAKE ONE TABLET BY MOUTH EVERY MORNING   mupirocin ointment (BACTROBAN) 2 % Apply 1 application topically 2 (two) times daily.   nitroGLYCERIN (NITROSTAT) 0.4 MG SL tablet Place 1 tablet (0.4 mg total) under the tongue every 5 (five) minutes as needed for chest pain.   ondansetron (ZOFRAN ODT) 8 MG disintegrating tablet Take 1 tablet (8 mg total) by mouth every 8 (eight) hours as needed for nausea or vomiting.   oxyCODONE-acetaminophen (PERCOCET) 5-325 MG tablet Take 1-2 tablets by mouth every 4 (four) hours as needed for moderate pain.   pantoprazole (PROTONIX) 40 MG tablet TAKE ONE TABLET BY MOUTH EVERY MORNING   venlafaxine XR (EFFEXOR-XR) 150 MG 24 hr capsule TAKE ONE TABLET BY MOUTH EVERY MORNING   No facility-administered encounter medications on file as of 04/02/2021.   Reviewed chart for medication changes ahead of medication coordination call.  No OVs, Consults, or hospital visits since last care coordination call/Pharmacist visit. (If appropriate, list visit date, provider name)  No medication changes indicated OR if recent visit, treatment plan here.  BP Readings from Last 3 Encounters:  03/29/21 (!) 142/93  03/27/21 (!) 165/97  02/09/21 (!) 142/70    Lab Results  Component Value Date   HGBA1C 5.4 09/13/2018  Patient obtains medications through Adherence Packaging  30 Days    Last adherence delivery included:   Losartan (COZAAR) 50 mg: one tablet with breakfast Methimazole (TAPAZOLE) 10 mg: two tablets at dinner Fluticasone (FLONASE) 50 MCG/ACT nasal spray: Place 2 sprays into both nostrils daily Pantoprazole (PROTONIX) 40 mg: one tablet at  breakfast Venlafaxine XR (EFFEXOR-XR) 150 mg: one capsule by mouth with breakfast Clopidogrel (PLAVIX) 75 mg: one tablet at breakfast Atorvastatin (LIPITOR) 40 mg: one tablet at dinner Aspirin EC 81 mg: one tablet at dinner Metoprolol succinate (TOPROL-XL) 50 MG 24 hr: one tablet at breakfast Albuterol HFA : 2 puffs every 6 hours as needed Furosemide 30 mg 1 tablet daily as needed   Patient declined (meds) last month: No medications declined unable to reach patient     Patient is due for next adherence delivery on: 04/12/2021   Called patient and reviewed medications and coordinated delivery.   This delivery to include: Losartan (COZAAR) 50 mg: one tablet with breakfast Methimazole (TAPAZOLE) 10 mg: two tablets at dinner Fluticasone (FLONASE) 50 MCG/ACT nasal spray: Place 2 sprays into both nostrils daily Pantoprazole (PROTONIX) 40 mg: one tablet at breakfast Venlafaxine XR (EFFEXOR-XR) 150 mg: one capsule by mouth with breakfast Clopidogrel (PLAVIX) 75 mg: one tablet at breakfast -SEE PHARM NOTE PT NEEDS APPT WITH DR Angelena Form Atorvastatin (LIPITOR) 40 mg: one tablet at dinner Aspirin EC 81 mg: one tablet at dinner Metoprolol succinate (TOPROL-XL) 50 MG 24 hr: one tablet at breakfast Albuterol HFA : 2 puffs every 6 hours as needed Furosemide 30 mg 1 tablet daily as needed   Patient will need a short fill:    Coordinated acute fill:    Patient declined the following medications:    Unable to confirmed delivery date of 04/12/2021, unable to reach patient after several attempts.    Care Gaps: AWV - message sent to Ramond Craver Last BP - 142/70 on 02/09/2021 Last A1C - 5.4 on 09/13/2018 Pneumonia vaccine - never done Zoster vaccine - never done Mammogram - overdue Colonoscopy - overdue Covid booster - overdue TDAP - overdue Dexascan - never done   Star Rating Drugs: Losartan  50 mg - Last filled 03/07/2021 30 DS at [facility]  Atorvastatin 40 mg - Last filled 0125/2023 30  DS at [facility] [phone]

## 2021-04-05 ENCOUNTER — Telehealth: Payer: Self-pay | Admitting: Family Medicine

## 2021-04-05 NOTE — Telephone Encounter (Signed)
Tried calling patient to schedule Medicare Annual Wellness Visit (AWV) either virtually or in office.   No answer    AWV-I per PALMETTO 10/13/10/[facility]h  please schedule at anytime with [facility] Nurse Health Advisor 1 or 2   This should be a 45 minute visit.

## 2021-04-06 ENCOUNTER — Other Ambulatory Visit: Payer: Self-pay | Admitting: Cardiovascular Disease

## 2021-04-19 ENCOUNTER — Ambulatory Visit: Payer: Medicare Other | Admitting: Endocrinology

## 2021-04-19 ENCOUNTER — Encounter (HOSPITAL_COMMUNITY): Payer: Self-pay | Admitting: Radiology

## 2021-04-25 ENCOUNTER — Other Ambulatory Visit: Payer: Self-pay

## 2021-04-25 ENCOUNTER — Ambulatory Visit (INDEPENDENT_AMBULATORY_CARE_PROVIDER_SITE_OTHER): Payer: Medicare Other | Admitting: Endocrinology

## 2021-04-25 VITALS — BP 142/80 | HR 75 | Ht 70.0 in | Wt 184.6 lb

## 2021-04-25 DIAGNOSIS — E0590 Thyrotoxicosis, unspecified without thyrotoxic crisis or storm: Secondary | ICD-10-CM

## 2021-04-25 LAB — T4, FREE: Free T4: 0.69 ng/dL (ref 0.60–1.60)

## 2021-04-25 LAB — TSH: TSH: 2.94 u[IU]/mL (ref 0.35–5.50)

## 2021-04-25 NOTE — Progress Notes (Signed)
? ?Subjective:  ? ? Patient ID: Laurie [name], female    DOB: [birth year], 66 y.o.   MRN: [id number] ? ?HPI ?Pt returns for f/u of hyperthyroidism.   ?Dx'ed: 2021 ?Imaging: never.   ?Current rx: tapazole.   ?Other: tapazole is chosen as initial rx, due to severity.   ?Interval hx: she feels better, since recent fall at home.  Pt says she sometimes misses the tapazole.   ?Past Medical History:  ?Diagnosis Date  ? Allergy   ? Anxiety   ? Arthritis   ? RA  ? Asthma   ? Chronic back pain   ? COPD (chronic obstructive pulmonary disease) ([place])   ? Depression   ? Esophageal stricture   ? GERD (gastroesophageal reflux disease)   ? Heart murmur   ? Hematemesis 09/10/2018  ? Hyperlipidemia   ? Hypertension   ? IBS (irritable bowel syndrome)   ? Interstitial cystitis   ? Neuromuscular disorder ([place])   ? fibromyalgia  ? PONV (postoperative nausea and vomiting)   ? ? ?Past Surgical History:  ?Procedure Laterality Date  ? ABDOMINAL HYSTERECTOMY    ? APPENDECTOMY    ? BIOPSY  09/11/2018  ? Procedure: BIOPSY;  Surgeon: Irving Copas., MD;  Location: [place];  Service: Gastroenterology;;  ? CERVICAL FUSION    ? CERVICAL LAMINECTOMY    ? CORONARY STENT INTERVENTION N/A 10/07/2019  ? Procedure: CORONARY STENT INTERVENTION;  Surgeon: Burnell Blanks, MD;  Location: [place] [facility];  Service: Cardiovascular;  Laterality: N/A;  ? ESOPHAGOGASTRODUODENOSCOPY    ? ESOPHAGOGASTRODUODENOSCOPY (EGD) WITH PROPOFOL N/A 09/11/2018  ? Procedure: ESOPHAGOGASTRODUODENOSCOPY (EGD) WITH PROPOFOL;  Surgeon: Rush Landmark Telford Nab., MD;  Location: [place];  Service: Gastroenterology;  Laterality: N/A;  ? FINGER SURGERY    ? KYPHOPLASTY N/A 03/29/2021  ? Procedure: Lumbar Three KYPHOPLASTY;  Surgeon: Kristeen Miss, MD;  Location: [place];  Service: Neurosurgery;  Laterality: N/A;  ? LEFT HEART CATH AND CORONARY ANGIOGRAPHY N/A 10/07/2019  ? Procedure: LEFT HEART CATH AND CORONARY ANGIOGRAPHY;  Surgeon: Burnell Blanks,  MD;  Location: [place] [facility];  Service: Cardiovascular;  Laterality: N/A;  ? LUMBAR FUSION    ? SAVORY DILATION N/A 09/11/2018  ? Procedure: SAVORY DILATION;  Surgeon: Irving Copas., MD;  Location: [place];  Service: Gastroenterology;  Laterality: N/A;  ? TONSILLECTOMY    ? ? ?Social History  ? ?Socioeconomic History  ? Marital status: Single  ?  Spouse name: Not on file  ? Number of children: Not on file  ? Years of education: Not on file  ? Highest education level: Not on file  ?Occupational History  ? Occupation: disability  ?Tobacco Use  ? Smoking status: Every Day  ?  Packs/day: 0.50  ?  Types: Cigarettes  ? Smokeless tobacco: Never  ? Tobacco comments:  ?  trying to quit   ?Vaping Use  ? Vaping Use: Never used  ?Substance and Sexual Activity  ? Alcohol use: Yes  ?  Comment: occasional  ? Drug use: Yes  ?  Types: Marijuana  ? Sexual activity: Not on file  ?Other Topics Concern  ? Not on file  ?Social History Narrative  ? Not on file  ? ?Social Determinants of Health  ? ?Financial Resource Strain: Not on file  ?Food Insecurity: Not on file  ?Transportation Needs: Not on file  ?Physical Activity: Not on file  ?Stress: Not on file  ?Social Connections: Not on file  ?Intimate Partner  Violence: Not on file  ? ? ?Current Outpatient Medications on File Prior to Visit  ?Medication Sig Dispense Refill  ? albuterol (VENTOLIN HFA) 108 (90 Base) MCG/ACT inhaler INHALE TWO PUFFS BY MOUTH INTO LUNGS every SIX hours AS NEEDED FOR WHEEZING AND/OR SHORTNESS OF BREATH 8.5 g 3  ? aspirin EC 81 MG tablet Take 1 tablet (81 mg total) by mouth daily. Swallow whole. 90 tablet 3  ? atorvastatin (LIPITOR) 40 MG tablet Take 1 tablet (40 mg total) by mouth every evening. Pt needs to make appt with provider before further refills - 1st attempt 90 tablet 0  ? Blood Pressure Monitoring DEVI Use as needed DX I10 1 each 1  ? clopidogrel (PLAVIX) 75 MG tablet Take 1 tablet (75 mg total) by mouth daily. Please make overdue  appt with Dr. Angelena Form before anymore refills. Thank you 2nd attempt 15 tablet 0  ? fluticasone (FLONASE) 50 MCG/ACT nasal spray INSTILL 2 SPRAYS IN EACH NOSTRIL ONCE DAILY 16 g 6  ? furosemide (LASIX) 20 MG tablet TAKE ONE TABLET BY MOUTH daily AS NEEDED FOR LEG EDEMA 30 tablet 3  ? guaiFENesin (MUCINEX) 600 MG 12 hr tablet Take 600 mg by mouth 2 (two) times daily as needed.    ? losartan (COZAAR) 50 MG tablet TAKE ONE TABLET BY MOUTH EVERY MORNING 90 tablet 3  ? Menthol, Topical Analgesic, (BIOFREEZE EX) Apply 1 application topically daily as needed (back pain).    ? methimazole (TAPAZOLE) 10 MG tablet TAKE TWO TABLETS BY MOUTH EVERY EVENING 180 tablet 1  ? metoprolol succinate (TOPROL-XL) 50 MG 24 hr tablet TAKE ONE TABLET BY MOUTH EVERY MORNING 90 tablet 3  ? mupirocin ointment (BACTROBAN) 2 % Apply 1 application topically 2 (two) times daily. 22 g 0  ? ondansetron (ZOFRAN ODT) 8 MG disintegrating tablet Take 1 tablet (8 mg total) by mouth every 8 (eight) hours as needed for nausea or vomiting. 15 tablet 0  ? oxyCODONE-acetaminophen (PERCOCET) 5-325 MG tablet Take 1-2 tablets by mouth every 4 (four) hours as needed for moderate pain. 20 tablet 0  ? pantoprazole (PROTONIX) 40 MG tablet TAKE ONE TABLET BY MOUTH EVERY MORNING 90 tablet 1  ? venlafaxine XR (EFFEXOR-XR) 150 MG 24 hr capsule TAKE ONE TABLET BY MOUTH EVERY MORNING 30 capsule 1  ? nitroGLYCERIN (NITROSTAT) 0.4 MG SL tablet Place 1 tablet (0.4 mg total) under the tongue every 5 (five) minutes as needed for chest pain. 25 tablet 1  ? ?No current facility-administered medications on file prior to visit.  ? ? ?Allergies  ?Allergen Reactions  ? Codeine Nausea And Vomiting  ? Lisinopril Cough  ? Sulfonamide Derivatives Nausea And Vomiting  ? Tetracyclines & Related Rash  ? ? ?Family History  ?Problem Relation Age of Onset  ? Dementia Mother   ? Heart attack Father   ? Coronary artery disease Father   ? Cancer Brother   ?     esophageal  ? Esophageal cancer  Brother   ? Coronary artery disease Paternal Aunt   ? Stomach cancer Paternal Aunt   ? Coronary artery disease Paternal Grandmother   ? Aneurysm Brother   ?     aortic  ? Rectal cancer Neg Hx   ? Colon cancer Neg Hx   ? Thyroid disease Neg Hx   ? ? ?BP (!) 142/80 (BP Location: Left Arm, Patient Position: Sitting, Cuff Size: Normal)   Pulse 75   Ht '5\' 10"'$  (1.778 m)   Wt  184 lb 9.6 oz (83.7 kg)   SpO2 96%   BMI 26.49 kg/m?  ? ? ?Review of Systems ?Denies fever ?   ?Objective:  ? Physical Exam ?VITAL SIGNS:  See vs page ?GENERAL: no distress ?NECK: thyroid is slightly and diffusely enlarged.   ? ? ?Lab Results  ?Component Value Date  ? TSH 2.94 04/25/2021  ? ?   ?Assessment & Plan:  ?Hyperthyroidism: well-controlled.  Please continue the same methimazole ? ?

## 2021-04-25 NOTE — Patient Instructions (Addendum)
Blood tests are requested for you today.  We'll let you know about the results.   ?If ever you have fever while taking methimazole, stop it and call us, even if the reason is obvious, because of the risk of a rare side-effect.   ?It is best to never miss the medication.  However, if you do miss it, next best is to double up the next time.   ?Please continue the same metoprolol.   ?Please come back for a follow-up appointment in 4 months.   ?

## 2021-05-01 ENCOUNTER — Other Ambulatory Visit: Payer: Self-pay | Admitting: Cardiovascular Disease

## 2021-05-01 ENCOUNTER — Other Ambulatory Visit: Payer: Self-pay | Admitting: Endocrinology

## 2021-05-01 ENCOUNTER — Other Ambulatory Visit: Payer: Self-pay | Admitting: Family Medicine

## 2021-05-02 ENCOUNTER — Telehealth: Payer: Self-pay | Admitting: Pharmacist

## 2021-05-02 NOTE — Chronic Care Management (AMB) (Signed)
? ? ?Chronic Care Management ?Pharmacy Assistant  ? ?Name: Laurie [name]  MRN: [id number] DOB: [birth year] ? ?Reason for Encounter: Medication Review / Medication Coordination Call ?  ?Conditions to be addressed/monitored: ?HTN ? ? ?Recent office visits:  ?None ? ?Recent consult visits:  ?04/25/2021 Renato Shin MD (endocrinology) - Patient was seen for hyperthyroidism. No medication changes. Follow up in 4 months.  ? ?Hospital visits:  ?None ? ?Medications: ?Outpatient Encounter Medications as of 05/02/2021  ?Medication Sig  ? albuterol (VENTOLIN HFA) 108 (90 Base) MCG/ACT inhaler INHALE TWO PUFFS BY MOUTH INTO LUNGS every SIX hours AS NEEDED FOR WHEEZING AND/OR SHORTNESS OF BREATH  ? aspirin EC 81 MG tablet Take 1 tablet (81 mg total) by mouth daily. Swallow whole.  ? atorvastatin (LIPITOR) 40 MG tablet Take 1 tablet (40 mg total) by mouth every evening. Pt needs to make appt with provider before further refills - 1st attempt  ? Blood Pressure Monitoring DEVI Use as needed DX I10  ? clopidogrel (PLAVIX) 75 MG tablet Take 1 tablet (75 mg total) by mouth daily.  ? fluticasone (FLONASE) 50 MCG/ACT nasal spray USE TWO SPRAYS in each nostril daily  ? furosemide (LASIX) 20 MG tablet TAKE ONE TABLET BY MOUTH daily AS NEEDED FOR LEG EDEMA  ? guaiFENesin (MUCINEX) 600 MG 12 hr tablet Take 600 mg by mouth 2 (two) times daily as needed.  ? losartan (COZAAR) 50 MG tablet TAKE ONE TABLET BY MOUTH EVERY MORNING  ? Menthol, Topical Analgesic, (BIOFREEZE EX) Apply 1 application topically daily as needed (back pain).  ? methimazole (TAPAZOLE) 10 MG tablet TAKE TWO TABLETS BY MOUTH EVERY EVENING  ? metoprolol succinate (TOPROL-XL) 50 MG 24 hr tablet TAKE ONE TABLET BY MOUTH EVERY MORNING  ? mupirocin ointment (BACTROBAN) 2 % Apply 1 application topically 2 (two) times daily.  ? nitroGLYCERIN (NITROSTAT) 0.4 MG SL tablet Place 1 tablet (0.4 mg total) under the tongue every 5 (five) minutes as needed for chest pain.  ? ondansetron  (ZOFRAN ODT) 8 MG disintegrating tablet Take 1 tablet (8 mg total) by mouth every 8 (eight) hours as needed for nausea or vomiting.  ? oxyCODONE-acetaminophen (PERCOCET) 5-325 MG tablet Take 1-2 tablets by mouth every 4 (four) hours as needed for moderate pain.  ? pantoprazole (PROTONIX) 40 MG tablet TAKE ONE TABLET BY MOUTH EVERY MORNING  ? venlafaxine XR (EFFEXOR-XR) 150 MG 24 hr capsule TAKE ONE TABLET BY MOUTH EVERY MORNING  ? ?No facility-administered encounter medications on file as of 05/02/2021.  ? ?Reviewed chart for medication changes ahead of medication coordination call. ? ?No OVs, Consults, or hospital visits since last care coordination call/Pharmacist visit. (If appropriate, list visit date, provider name) ? ?No medication changes indicated OR if recent visit, treatment plan here. ? ?BP Readings from Last 3 Encounters:  ?04/25/21 (!) 142/80  ?03/29/21 (!) 142/93  ?03/27/21 (!) 165/97  ?  ?Lab Results  ?Component Value Date  ? HGBA1C 5.4 09/13/2018  ?  ? ?Patient obtains medications through Adherence Packaging  30 Days  ?  ?Last adherence delivery included:  ? Losartan (COZAAR) 50 mg: one tablet with breakfast ?Methimazole (TAPAZOLE) 10 mg: two tablets at dinner ?Fluticasone (FLONASE) 50 MCG/ACT nasal spray: Place 2 sprays into both nostrils daily ?Pantoprazole (PROTONIX) 40 mg: one tablet at breakfast ?Venlafaxine XR (EFFEXOR-XR) 150 mg: one capsule by mouth with breakfast ?Clopidogrel (PLAVIX) 75 mg: one tablet at breakfast -SEE PHARM NOTE PT NEEDS APPT WITH DR Angelena Form ?Atorvastatin (LIPITOR) 40 mg:  one tablet at dinner ?Aspirin EC 81 mg: one tablet at dinner ?Metoprolol succinate (TOPROL-XL) 50 MG 24 hr: one tablet at breakfast ?Albuterol HFA : 2 puffs every 6 hours as needed ?Furosemide 30 mg 1 tablet daily as needed ?  ?Patient declined (meds) last month: No medications declined unable to reach patient  ?  ?  ?Patient is due for next adherence delivery on: 05/11/2021 ?  ?Called patient and  reviewed medications and coordinated delivery. ?  ?This delivery to include: ?Losartan (COZAAR) 50 mg: one tablet with breakfast ?Methimazole (TAPAZOLE) 10 mg: two tablets at dinner ?Fluticasone (FLONASE) 50 MCG/ACT nasal spray: Place 2 sprays into both nostrils daily ?Pantoprazole (PROTONIX) 40 mg: one tablet at breakfast ?Venlafaxine XR (EFFEXOR-XR) 150 mg: one capsule by mouth with breakfast ?Clopidogrel (PLAVIX) 75 mg: one tablet at breakfast -SEE PHARM NOTE PT NEEDS APPT WITH DR Angelena Form ?Atorvastatin (LIPITOR) 40 mg: one tablet at dinner ?Aspirin EC 81 mg: one tablet at dinner ?Metoprolol succinate (TOPROL-XL) 50 MG 24 hr: one tablet at breakfast ?Albuterol HFA : 2 puffs every 6 hours as needed ?Furosemide 30 mg 1 tablet daily as needed ?  ?Patient will need a short fill:  ?  ?Coordinated acute fill:  ?  ?Patient declined the following medications:  ?  ?Unable to confirmed delivery date of 05/11/2021, unable to reach patient. ?  ?  ?Care Gaps: ?AWV - completed 06/29/2020 ?Last BP - 142/80 on 04/25/2021 ?Last A1C - 5.4 on 09/13/2018 ?Pneumonia vaccine - never done ?Zoster vaccine - never done ?Mammogram - overdue ?Colonoscopy - overdue ?Covid booster - overdue ?TDAP - overdue ?Dexascan - never done ?  ?Star Rating Drugs: ?Losartan  50 mg - Last filled 04/06/2021 30 DS at [facility]  ?Atorvastatin 40 mg - Last filled 04/06/2021 30 DS at [facility]  ? ?Gennie Alma CMA  ?Clinical Pharmacist Assistant ?[phone] ? ?

## 2021-05-16 ENCOUNTER — Telehealth: Payer: Self-pay

## 2021-05-16 NOTE — Telephone Encounter (Signed)
Returning patient's call regarding labs, Received no answer. LVM for a callback  ?

## 2021-05-17 ENCOUNTER — Telehealth: Payer: Self-pay | Admitting: Family Medicine

## 2021-05-17 NOTE — Telephone Encounter (Signed)
Left message for patient to call back and schedule Medicare Annual Wellness Visit (AWV) either virtually or in office. Left  my jabber number [phone] ? ?AWV-I per PAL[place] 10/13/10 ? please schedule at anytime with [facility] Nurse Health Advisor 1 or 2 ? ? ?This should be a 45 minute visit.  ?

## 2021-05-30 ENCOUNTER — Other Ambulatory Visit: Payer: Self-pay | Admitting: Cardiovascular Disease

## 2021-05-31 ENCOUNTER — Telehealth: Payer: Self-pay | Admitting: Pharmacist

## 2021-05-31 NOTE — Chronic Care Management (AMB) (Signed)
? ? ?Chronic Care Management ?Pharmacy Assistant  ? ?Name: Laurie [name]  MRN: [id number] DOB: September 28, 1955 ? ?Reason for Encounter: Medication Review / Medication Coordination Call ?  ?Conditions to be addressed/monitored: ?HTN ? ?Recent office visits:  ?None ? ?Recent consult visits:  ?None ? ?Hospital visits:  ?None ? ?Medications: ?Outpatient Encounter Medications as of 05/31/2021  ?Medication Sig  ? albuterol (VENTOLIN HFA) 108 (90 Base) MCG/ACT inhaler INHALE TWO PUFFS BY MOUTH INTO LUNGS every SIX hours AS NEEDED FOR WHEEZING AND/OR SHORTNESS OF BREATH  ? aspirin EC 81 MG tablet Take 1 tablet (81 mg total) by mouth daily. Swallow whole.  ? atorvastatin (LIPITOR) 40 MG tablet TAKE ONE TABLET BY MOUTH EVERY EVENING Needs appointment for further refills  ? Blood Pressure Monitoring DEVI Use as needed DX I10  ? clopidogrel (PLAVIX) 75 MG tablet Take 1 tablet (75 mg total) by mouth daily.  ? fluticasone (FLONASE) 50 MCG/ACT nasal spray USE TWO SPRAYS in each nostril daily  ? furosemide (LASIX) 20 MG tablet TAKE ONE TABLET BY MOUTH daily AS NEEDED FOR LEG EDEMA  ? guaiFENesin (MUCINEX) 600 MG 12 hr tablet Take 600 mg by mouth 2 (two) times daily as needed.  ? losartan (COZAAR) 50 MG tablet TAKE ONE TABLET BY MOUTH EVERY MORNING  ? Menthol, Topical Analgesic, (BIOFREEZE EX) Apply 1 application topically daily as needed (back pain).  ? methimazole (TAPAZOLE) 10 MG tablet TAKE TWO TABLETS BY MOUTH EVERY EVENING  ? metoprolol succinate (TOPROL-XL) 50 MG 24 hr tablet TAKE ONE TABLET BY MOUTH EVERY MORNING  ? mupirocin ointment (BACTROBAN) 2 % Apply 1 application topically 2 (two) times daily.  ? nitroGLYCERIN (NITROSTAT) 0.4 MG SL tablet Place 1 tablet (0.4 mg total) under the tongue every 5 (five) minutes as needed for chest pain.  ? ondansetron (ZOFRAN ODT) 8 MG disintegrating tablet Take 1 tablet (8 mg total) by mouth every 8 (eight) hours as needed for nausea or vomiting.  ? oxyCODONE-acetaminophen (PERCOCET) 5-325  MG tablet Take 1-2 tablets by mouth every 4 (four) hours as needed for moderate pain.  ? pantoprazole (PROTONIX) 40 MG tablet TAKE ONE TABLET BY MOUTH EVERY MORNING  ? venlafaxine XR (EFFEXOR-XR) 150 MG 24 hr capsule TAKE ONE TABLET BY MOUTH EVERY MORNING  ? ?No facility-administered encounter medications on file as of 05/31/2021.  ?Reviewed chart for medication changes ahead of medication coordination call. ? ?No OVs, Consults, or hospital visits since last care coordination call/Pharmacist visit. (If appropriate, list visit date, provider name) ? ?No medication changes indicated OR if recent visit, treatment plan here. ? ?BP Readings from Last 3 Encounters:  ?04/25/21 (!) 142/80  ?03/29/21 (!) 142/93  ?03/27/21 (!) 165/97  ?  ?Lab Results  ?Component Value Date  ? HGBA1C 5.4 09/13/2018  ?  ? ?Patient obtains medications through Adherence Packaging  30 Days  ?  ?Last adherence delivery included:  ?Losartan (COZAAR) 50 mg: one tablet with breakfast ?Methimazole (TAPAZOLE) 10 mg: two tablets at dinner ?Fluticasone (FLONASE) 50 MCG/ACT nasal spray: Place 2 sprays into both nostrils daily ?Pantoprazole (PROTONIX) 40 mg: one tablet at breakfast ?Venlafaxine XR (EFFEXOR-XR) 150 mg: one capsule by mouth with breakfast ?Clopidogrel (PLAVIX) 75 mg: one tablet at breakfast  ?Atorvastatin (LIPITOR) 40 mg: one tablet at dinner ?Aspirin EC 81 mg: one tablet at dinner ?Metoprolol succinate (TOPROL-XL) 50 MG 24 hr: one tablet at breakfast ?Albuterol HFA : 2 puffs every 6 hours as needed ?Furosemide 30 mg 1 tablet daily as needed ?  ?  Patient declined (meds) last month: No medications declined unable to reach patient  ?  ?  ?Patient is due for next adherence delivery on: 06/12/2021 ?  ?Called patient and reviewed medications and coordinated delivery. ?  ?This delivery to include: ?Losartan (COZAAR) 50 mg: one tablet with breakfast ?Methimazole (TAPAZOLE) 10 mg: two tablets at dinner ?Pantoprazole (PROTONIX) 40 mg: one tablet at  breakfast ?Venlafaxine XR (EFFEXOR-XR) 150 mg: one capsule by mouth with breakfast ?Clopidogrel (PLAVIX) 75 mg: one tablet at breakfast  ?Atorvastatin (LIPITOR) 40 mg: one tablet at dinner ?Aspirin EC 81 mg: one tablet at dinner ?Metoprolol succinate (TOPROL-XL) 50 MG 24 hr: one tablet at breakfast ?  ?Patient will need a short fill: No acute fills ?  ?Coordinated acute fill: No acute fills ?  ?Patient declined the following medications:  ?Albuterol HFA : 2 puffs every 6 hours as needed ?Furosemide 30 mg 1 tablet daily as needed ?Fluticasone (FLONASE) 50 MCG/ACT nasal spray: Place 2 sprays into both nostrils daily ? ?Confirmed delivery date of 06/12/2021, advised patient that pharmacy will contact them the morning of delivery. ? ? ?Care Gaps: ?AWV - completed 06/29/2020 ?Last BP - 142/80 on 04/25/2021 ?Last A1C - 5.4 on 09/13/2018 ?Pneumonia vaccine - never done ?Zoster vaccine - never done ?Mammogram - overdue ?Colonoscopy - overdue ?Covid booster - overdue ?TDAP - overdue ?Dexascan - never done ?  ?Star Rating Drugs: ?Losartan  50 mg - Last filled 03/272023 30 DS at [facility]  ?Atorvastatin 40 mg - Last filled 05/07/2021 30 DS at [facility]  ? ?Gennie Alma CMA  ?Clinical Pharmacist Assistant ?[phone] ? ?

## 2021-06-10 NOTE — Progress Notes (Deleted)
No chief complaint on file.  History of Present Illness: 66 yo female with history of CAD, Graves disease, anxiety, rheumatoid arthritis, COPD/asthma, esophageal stricture, GERD, HTN, HLD, irritable bowel syndrome, tobacco abuse and fibromyalgia who is here today for follow up. Echo in 2019 with LVEF 65%, mild MR. Cardiac cath in 2011 with mild CAD. Chronically abnormal EKG. Cardiac cath 10/07/19 with severe mid Circumflex/OM stenosis treated with two drug eluting stents. Mild disease in the proximal LAD and moderate disease in the mid RCA. LV systolic function was normal. She had dyspnea on Brilinta.  She is here today for follow up. The patient denies any chest pain, dyspnea, palpitations, lower extremity edema, orthopnea, PND, dizziness, near syncope or syncope.    Primary Care Physician: [name], MD   Past Medical History:  Diagnosis Date   Allergy    Anxiety    Arthritis    RA   Asthma    Chronic back pain    COPD (chronic obstructive pulmonary disease) (HCC)    Depression    Esophageal stricture    GERD (gastroesophageal reflux disease)    Heart murmur    Hematemesis 09/10/2018   Hyperlipidemia    Hypertension    IBS (irritable bowel syndrome)    Interstitial cystitis    Neuromuscular disorder (HCC)    fibromyalgia   PONV (postoperative nausea and vomiting)     Past Surgical History:  Procedure Laterality Date   ABDOMINAL HYSTERECTOMY     APPENDECTOMY     BIOPSY  09/11/2018   Procedure: BIOPSY;  Surgeon: Irving Copas., MD;  Location: [facility];  Service: Gastroenterology;;   CERVICAL FUSION     CERVICAL LAMINECTOMY     CORONARY STENT INTERVENTION N/A 10/07/2019   Procedure: CORONARY STENT INTERVENTION;  Surgeon: Burnell Blanks, MD;  Location: [facility];  Service: Cardiovascular;  Laterality: N/A;   ESOPHAGOGASTRODUODENOSCOPY     ESOPHAGOGASTRODUODENOSCOPY (EGD) WITH PROPOFOL N/A 09/11/2018   Procedure:  ESOPHAGOGASTRODUODENOSCOPY (EGD) WITH PROPOFOL;  Surgeon: Rush Landmark Telford Nab., MD;  Location: [facility];  Service: Gastroenterology;  Laterality: N/A;   FINGER SURGERY     KYPHOPLASTY N/A 03/29/2021   Procedure: Lumbar Three KYPHOPLASTY;  Surgeon: Kristeen Miss, MD;  Location: [facility];  Service: Neurosurgery;  Laterality: N/A;   LEFT HEART CATH AND CORONARY ANGIOGRAPHY N/A 10/07/2019   Procedure: LEFT HEART CATH AND CORONARY ANGIOGRAPHY;  Surgeon: Burnell Blanks, MD;  Location: [facility];  Service: Cardiovascular;  Laterality: N/A;   LUMBAR FUSION     SAVORY DILATION N/A 09/11/2018   Procedure: SAVORY DILATION;  Surgeon: Rush Landmark Telford Nab., MD;  Location: [facility];  Service: Gastroenterology;  Laterality: N/A;   TONSILLECTOMY      Current Outpatient Medications  Medication Sig Dispense Refill   albuterol (VENTOLIN HFA) 108 (90 Base) MCG/ACT inhaler INHALE TWO PUFFS BY MOUTH INTO LUNGS every SIX hours AS NEEDED FOR WHEEZING AND/OR SHORTNESS OF BREATH 8.5 g 3   aspirin EC 81 MG tablet Take 1 tablet (81 mg total) by mouth daily. Swallow whole. 90 tablet 3   atorvastatin (LIPITOR) 40 MG tablet TAKE ONE TABLET BY MOUTH EVERY EVENING Needs appointment for further refills 90 tablet 0   Blood Pressure Monitoring DEVI Use as needed DX I10 1 each 1   clopidogrel (PLAVIX) 75 MG tablet Take 1 tablet (75 mg total) by mouth daily. 90 tablet 0   fluticasone (FLONASE) 50 MCG/ACT nasal spray USE TWO SPRAYS in each nostril  daily 16 g 6   furosemide (LASIX) 20 MG tablet TAKE ONE TABLET BY MOUTH daily AS NEEDED FOR LEG EDEMA 30 tablet 3   guaiFENesin (MUCINEX) 600 MG 12 hr tablet Take 600 mg by mouth 2 (two) times daily as needed.     losartan (COZAAR) 50 MG tablet TAKE ONE TABLET BY MOUTH EVERY MORNING 90 tablet 3   Menthol, Topical Analgesic, (BIOFREEZE EX) Apply 1 application topically daily as needed (back pain).     methimazole (TAPAZOLE) 10 MG tablet TAKE TWO TABLETS BY MOUTH  EVERY EVENING 180 tablet 1   metoprolol succinate (TOPROL-XL) 50 MG 24 hr tablet TAKE ONE TABLET BY MOUTH EVERY MORNING 90 tablet 3   mupirocin ointment (BACTROBAN) 2 % Apply 1 application topically 2 (two) times daily. 22 g 0   nitroGLYCERIN (NITROSTAT) 0.4 MG SL tablet Place 1 tablet (0.4 mg total) under the tongue every 5 (five) minutes as needed for chest pain. 25 tablet 1   ondansetron (ZOFRAN ODT) 8 MG disintegrating tablet Take 1 tablet (8 mg total) by mouth every 8 (eight) hours as needed for nausea or vomiting. 15 tablet 0   oxyCODONE-acetaminophen (PERCOCET) 5-325 MG tablet Take 1-2 tablets by mouth every 4 (four) hours as needed for moderate pain. 20 tablet 0   pantoprazole (PROTONIX) 40 MG tablet TAKE ONE TABLET BY MOUTH EVERY MORNING 90 tablet 1   venlafaxine XR (EFFEXOR-XR) 150 MG 24 hr capsule TAKE ONE TABLET BY MOUTH EVERY MORNING 30 capsule 6   No current facility-administered medications for this visit.    Allergies  Allergen Reactions   Codeine Nausea And Vomiting   Lisinopril Cough   Sulfonamide Derivatives Nausea And Vomiting   Tetracyclines & Related Rash    Social History   Socioeconomic History   Marital status: Single    Spouse name: Not on file   Number of children: Not on file   Years of education: Not on file   Highest education level: Not on file  Occupational History   Occupation: disability  Tobacco Use   Smoking status: Every Day    Packs/day: 0.50    Types: Cigarettes   Smokeless tobacco: Never   Tobacco comments:    trying to quit   Vaping Use   Vaping Use: Never used  Substance and Sexual Activity   Alcohol use: Yes    Comment: occasional   Drug use: Yes    Types: Marijuana   Sexual activity: Not on file  Other Topics Concern   Not on file  Social History Narrative   Not on file   Social Determinants of Health   Financial Resource Strain: Not on file  Food Insecurity: Not on file  Transportation Needs: Not on file  Physical  Activity: Not on file  Stress: Not on file  Social Connections: Not on file  Intimate Partner Violence: Not on file    Family History  Problem Relation Age of Onset   Dementia Mother    Heart attack Father    Coronary artery disease Father    Cancer Brother        esophageal   Esophageal cancer Brother    Coronary artery disease Paternal Aunt    Stomach cancer Paternal Aunt    Coronary artery disease Paternal Grandmother    Aneurysm Brother        aortic   Rectal cancer Neg Hx    Colon cancer Neg Hx    Thyroid disease Neg Hx  Review of Systems:  As stated in the HPI and otherwise negative.   There were no vitals taken for this visit.  Physical Examination: General: Well developed, well nourished, NAD  HEENT: OP clear, mucus membranes moist  SKIN: warm, dry. No rashes. Neuro: No focal deficits  Musculoskeletal: Muscle strength 5/5 all ext  Psychiatric: Mood and affect normal  Neck: No JVD, no carotid bruits, no thyromegaly, no lymphadenopathy.  Lungs:Clear bilaterally, no wheezes, rhonci, crackles Cardiovascular: Regular rate and rhythm. No murmurs, gallops or rubs. Abdomen:Soft. Bowel sounds present. Non-tender.  Extremities: No lower extremity edema. Pulses are 2 + in the bilateral DP/PT. .  EKG:  EKG is *** ordered today. The ekg ordered today demonstrates   Recent Labs: 02/09/2021: ALT 14; Pro B Natriuretic peptide (BNP) 51.0 03/29/2021: BUN 17; Creatinine, Ser 0.91; Hemoglobin 12.0; Platelets 226; Potassium 4.4; Sodium 137 04/25/2021: TSH 2.94   Lipid Panel    Component Value Date/Time   CHOL 159 09/03/2019 1353   TRIG 227 (H) 09/03/2019 1353   HDL 46 (L) 09/03/2019 1353   CHOLHDL 3.5 09/03/2019 1353   VLDL 36.0 07/23/2019 1441   LDLCALC 81 09/03/2019 1353   LDLDIRECT 123.0 03/14/2017 0948     Wt Readings from Last 3 Encounters:  04/25/21 184 lb 9.6 oz (83.7 kg)  03/29/21 180 lb (81.6 kg)  03/27/21 180 lb (81.6 kg)      Assessment and Plan:    1. CAD with stable angina: No chest pain. Continue ASA, statin and beta blocker. *** Will stop Plavix.   2. Tobacco abuse: Smoking cessation recommended  3. HTN: BP is well controlled. No changes  4. Hyperlipidemia: LDL ***. Continue statin  Current medicines are reviewed at length with the patient today.  The patient does not have concerns regarding medicines.  The following changes have been made:  no change  Labs/ tests ordered today include:   No orders of the defined types were placed in this encounter.    Disposition:   FU with care team APP in 6-8 weeks.    Signed, Lauree Chandler, MD 06/10/2021 5:58 PM    [address], [place],   [address] Phone: [phone]; Fax: [phone]

## 2021-06-11 ENCOUNTER — Ambulatory Visit: Payer: Medicare Other | Admitting: Cardiovascular Disease

## 2021-06-13 ENCOUNTER — Ambulatory Visit (INDEPENDENT_AMBULATORY_CARE_PROVIDER_SITE_OTHER): Payer: Medicare Other

## 2021-06-13 VITALS — Ht 70.0 in | Wt 180.0 lb

## 2021-06-13 DIAGNOSIS — Z1231 Encounter for screening mammogram for malignant neoplasm of breast: Secondary | ICD-10-CM | POA: Diagnosis not present

## 2021-06-13 DIAGNOSIS — Z1211 Encounter for screening for malignant neoplasm of colon: Secondary | ICD-10-CM | POA: Diagnosis not present

## 2021-06-13 DIAGNOSIS — Z13820 Encounter for screening for osteoporosis: Secondary | ICD-10-CM

## 2021-06-13 DIAGNOSIS — Z0000 Encounter for general adult medical examination without abnormal findings: Secondary | ICD-10-CM | POA: Diagnosis not present

## 2021-06-13 NOTE — Progress Notes (Signed)
? ?Subjective:  ? Laurie [name] is a 66 y.o. female who presents for Medicare Annual (Subsequent) preventive examination. ? ?Review of Systems    ?Virtual Visit via Telephone Note ? ?I connected with  Laurie [name] on 06/13/21 at  1:15 PM EDT by telephone and verified that I am speaking with the correct person using two identifiers. ? ?Location: ?Patient: Home ?Provider: Office ?Persons participating in the virtual visit: patient/Nurse Health Advisor ?  ?I discussed the limitations, risks, security and privacy concerns of performing an evaluation and management service by telephone and the availability of in person appointments. The patient expressed understanding and agreed to proceed. ? ?Interactive audio and video telecommunications were attempted between this nurse and patient, however failed, due to patient having technical difficulties OR patient did not have access to video capability.  We continued and completed visit with audio only. ? ?Some vital signs may be absent or patient reported.  ? ?Laurie Peaches, LPN  ?Cardiac Risk Factors include: advanced age (>70mn, >>57women);hypertension ? ?   ?Objective:  ?  ?Today's Vitals  ? 06/13/21 1318  ?Weight: 180 lb (81.6 kg)  ?Height: '5\' 10"'$  (1.778 m)  ? ?Body mass index is 25.83 kg/m?. ? ? ?  06/13/2021  ?  1:40 PM 03/29/2021  ?  1:29 PM 12/30/2020  ?  6:21 PM 10/07/2019  ?  7:19 AM 08/29/2019  ? 10:42 PM 09/11/2018  ? 12:42 PM 09/10/2018  ? 11:14 AM  ?Advanced Directives  ?Does Patient Have a Medical Advance Directive? No No No No No No No  ?Would patient like information on creating a medical advance directive? No - Patient declined No - Patient declined  No - Patient declined  No - Patient declined No - Patient declined  ? ? ?Current Medications (verified) ?Outpatient Encounter Medications as of 06/13/2021  ?Medication Sig  ? albuterol (VENTOLIN HFA) 108 (90 Base) MCG/ACT inhaler INHALE TWO PUFFS BY MOUTH INTO LUNGS every SIX hours AS NEEDED FOR WHEEZING  AND/OR SHORTNESS OF BREATH  ? aspirin EC 81 MG tablet Take 1 tablet (81 mg total) by mouth daily. Swallow whole.  ? atorvastatin (LIPITOR) 40 MG tablet TAKE ONE TABLET BY MOUTH EVERY EVENING Needs appointment for further refills  ? Blood Pressure Monitoring DEVI Use as needed DX I10  ? clopidogrel (PLAVIX) 75 MG tablet Take 1 tablet (75 mg total) by mouth daily.  ? fluticasone (FLONASE) 50 MCG/ACT nasal spray USE TWO SPRAYS in each nostril daily  ? furosemide (LASIX) 20 MG tablet TAKE ONE TABLET BY MOUTH daily AS NEEDED FOR LEG EDEMA  ? guaiFENesin (MUCINEX) 600 MG 12 hr tablet Take 600 mg by mouth 2 (two) times daily as needed.  ? losartan (COZAAR) 50 MG tablet TAKE ONE TABLET BY MOUTH EVERY MORNING  ? Menthol, Topical Analgesic, (BIOFREEZE EX) Apply 1 application topically daily as needed (back pain).  ? methimazole (TAPAZOLE) 10 MG tablet TAKE TWO TABLETS BY MOUTH EVERY EVENING  ? metoprolol succinate (TOPROL-XL) 50 MG 24 hr tablet TAKE ONE TABLET BY MOUTH EVERY MORNING  ? mupirocin ointment (BACTROBAN) 2 % Apply 1 application topically 2 (two) times daily.  ? nitroGLYCERIN (NITROSTAT) 0.4 MG SL tablet Place 1 tablet (0.4 mg total) under the tongue every 5 (five) minutes as needed for chest pain.  ? ondansetron (ZOFRAN ODT) 8 MG disintegrating tablet Take 1 tablet (8 mg total) by mouth every 8 (eight) hours as needed for nausea or vomiting.  ? oxyCODONE-acetaminophen (PERCOCET) 5-325  MG tablet Take 1-2 tablets by mouth every 4 (four) hours as needed for moderate pain.  ? pantoprazole (PROTONIX) 40 MG tablet TAKE ONE TABLET BY MOUTH EVERY MORNING  ? venlafaxine XR (EFFEXOR-XR) 150 MG 24 hr capsule TAKE ONE TABLET BY MOUTH EVERY MORNING  ? ?No facility-administered encounter medications on file as of 06/13/2021.  ? ? ?Allergies (verified) ?Codeine, Lisinopril, Sulfonamide derivatives, and Tetracyclines & related  ? ?History: ?Past Medical History:  ?Diagnosis Date  ? Allergy   ? Anxiety   ? Arthritis   ? RA  ? Asthma    ? Chronic back pain   ? COPD (chronic obstructive pulmonary disease) ([facility])   ? Depression   ? Esophageal stricture   ? GERD (gastroesophageal reflux disease)   ? Heart murmur   ? Hematemesis 09/10/2018  ? Hyperlipidemia   ? Hypertension   ? IBS (irritable bowel syndrome)   ? Interstitial cystitis   ? Neuromuscular disorder ([facility] [facility])   ? fibromyalgia  ? PONV (postoperative nausea and vomiting)   ? ?Past Surgical History:  ?Procedure Laterality Date  ? ABDOMINAL HYSTERECTOMY    ? APPENDECTOMY    ? BIOPSY  09/11/2018  ? Procedure: BIOPSY;  Surgeon: Irving Copas., MD;  Location: [facility];  Service: Gastroenterology;;  ? CERVICAL FUSION    ? CERVICAL LAMINECTOMY    ? CORONARY STENT INTERVENTION N/A 10/07/2019  ? Procedure: CORONARY STENT INTERVENTION;  Surgeon: Burnell Blanks, MD;  Location: [facility];  Service: Cardiovascular;  Laterality: N/A;  ? ESOPHAGOGASTRODUODENOSCOPY    ? ESOPHAGOGASTRODUODENOSCOPY (EGD) WITH PROPOFOL N/A 09/11/2018  ? Procedure: ESOPHAGOGASTRODUODENOSCOPY (EGD) WITH PROPOFOL;  Surgeon: Rush Landmark Telford Nab., MD;  Location: [facility];  Service: Gastroenterology;  Laterality: N/A;  ? FINGER SURGERY    ? KYPHOPLASTY N/A 03/29/2021  ? Procedure: Lumbar Three KYPHOPLASTY;  Surgeon: Kristeen Miss, MD;  Location: [facility];  Service: Neurosurgery;  Laterality: N/A;  ? LEFT HEART CATH AND CORONARY ANGIOGRAPHY N/A 10/07/2019  ? Procedure: LEFT HEART CATH AND CORONARY ANGIOGRAPHY;  Surgeon: Burnell Blanks, MD;  Location: [facility];  Service: Cardiovascular;  Laterality: N/A;  ? LUMBAR FUSION    ? SAVORY DILATION N/A 09/11/2018  ? Procedure: SAVORY DILATION;  Surgeon: Irving Copas., MD;  Location: [facility];  Service: Gastroenterology;  Laterality: N/A;  ? TONSILLECTOMY    ? ?Family History  ?Problem Relation Age of Onset  ? Dementia Mother   ? Heart attack Father   ? Coronary artery disease Father   ? Cancer Brother   ?     esophageal  ? Esophageal  cancer Brother   ? Coronary artery disease Paternal Aunt   ? Stomach cancer Paternal Aunt   ? Coronary artery disease Paternal Grandmother   ? Aneurysm Brother   ?     aortic  ? Rectal cancer Neg Hx   ? Colon cancer Neg Hx   ? Thyroid disease Neg Hx   ? ?Social History  ? ?Socioeconomic History  ? Marital status: Single  ?  Spouse name: Not on file  ? Number of children: Not on file  ? Years of education: Not on file  ? Highest education level: Not on file  ?Occupational History  ? Occupation: disability  ?Tobacco Use  ? Smoking status: Every Day  ?  Packs/day: 0.50  ?  Types: Cigarettes  ? Smokeless tobacco: Never  ? Tobacco comments:  ?  trying to quit   ?Vaping Use  ?  Vaping Use: Never used  ?Substance and Sexual Activity  ? Alcohol use: Yes  ?  Comment: occasional  ? Drug use: Yes  ?  Types: Marijuana  ? Sexual activity: Not on file  ?Other Topics Concern  ? Not on file  ?Social History Narrative  ? Not on file  ? ?Social Determinants of Health  ? ?Financial Resource Strain: Low Risk   ? Difficulty of Paying Living Expenses: Not hard at all  ?Food Insecurity: No Food Insecurity  ? Worried About Charity fundraiser in the Last Year: Never true  ? Ran Out of Food in the Last Year: Never true  ?Transportation Needs: No Transportation Needs  ? Lack of Transportation (Medical): No  ? Lack of Transportation (Non-Medical): No  ?Physical Activity: Insufficiently Active  ? Days of Exercise per Week: 3 days  ? Minutes of Exercise per Session: 10 min  ?Stress: No Stress Concern Present  ? Feeling of Stress : Only a little  ?Social Connections: Moderately Integrated  ? Frequency of Communication with Friends and Family: More than three times a week  ? Frequency of Social Gatherings with Friends and Family: More than three times a week  ? Attends Religious Services: More than 4 times per year  ? Active Member of Clubs or Organizations: Yes  ? Attends Archivist Meetings: More than 4 times per year  ? Marital  Status: Divorced  ? ? ?Tobacco Counseling ?Ready to quit: Yes ?Counseling given: Yes ?Tobacco comments: trying to quit  ? ? ?Clinical Intake: ? ?Pre-visit preparation completed: No ? ?Diabetic?  No ? ?Interpre

## 2021-06-13 NOTE — Patient Instructions (Addendum)
?Laurie [name] , ?Thank you for taking time to come for your Medicare Wellness Visit. I appreciate your ongoing commitment to your health goals. Please review the following plan we discussed and let me know if I can assist you in the future.  ? ?These are the goals we discussed: ? Goals   ? ?   Chronic Care Management   ?   CARE PLAN ENTRY ?(see longitudinal plan of care for additional care plan information) ? ?Current Barriers:  ?Chronic Disease Management support, education, and care coordination needs related to Hypertension, Hyperlipidemia, Coronary Artery Disease, Depression, Tobacco use and Chronic Pain  ?  ?Hypertension ?BP Readings from Last 3 Encounters:  ?10/19/19 140/70  ?10/07/19 (!) 139/50  ?09/27/19 (!) 144/78  ?Pharmacist Clinical Goal(s): ?Over the next 90 days, patient will work with PharmD and providers to achieve BP goal <130/80 ?Current regimen:  ?None ?Interventions: ?Discussed low salt diet and exercising as tolerated extensively ?Will initiate blood pressure monitoring plan  ?Patient self care activities - Over the next 90 days, patient will: ?Check Blood Pressure 2-3 times weekly, document, and provide at future appointments ?Ensure daily salt intake < 2300 mg/day ? ?Hyperlipidemia ?Lab Results  ?Component Value Date/Time  ? [place] 81 09/03/2019 01:53 PM  ? LDLDIRECT 123.0 03/14/2017 09:48 AM  ?Pharmacist Clinical Goal(s): ?Over the next 90 days, patient will work with PharmD and providers to achieve LDL goal < 70 ?Current regimen:  ?Atorvastatin 40 mg daily ?Interventions: ?Discussed low cholesterol diet and exercising as tolerated extensively ?Will initiate cholesterol monitoring plan  ? ?Medication management ?Pharmacist Clinical Goal(s): ?Over the next 90 days, patient will work with PharmD and providers to achieve optimal medication adherence ?Current pharmacy: [facility] ?Interventions ?Comprehensive medication review performed. ?Utilize [facility] for medication  synchronization, packaging and delivery ?Verbal consent obtained for [facility] enhanced pharmacy services (medication synchronization, adherence packaging, delivery coordination). A medication sync plan was created to allow patient to get all medications delivered once every 30 to 90 days per patient preference. Patient understands they have freedom to choose pharmacy and clinical pharmacist will coordinate care between all prescribers and [facility]. ? ?Patient self care activities - Over the next 90 days, patient will: ?Take medications as prescribed ?Report any questions or concerns to PharmD and/or provider(s) ?  ?   Increase physical activity (pt-stated)   ?   I want to  exercise more. ?  ? ?  ?  ?This is a list of the screening recommended for you and due dates:  ?Health Maintenance  ?Topic Date Due  ? COVID-19 Vaccine (2 - Booster for Janssen series) 06/29/2021*  ? Zoster (Shingles) Vaccine (1 of 2) 09/13/2021*  ? Pneumonia Vaccine (1 - PCV) 06/14/2022*  ? Mammogram  06/14/2022*  ? DEXA scan (bone density measurement)  06/14/2022*  ? Colon Cancer Screening  06/14/2022*  ? Tetanus Vaccine  06/14/2022*  ? Flu Shot  09/11/2021  ? Hepatitis C Screening: USPSTF Recommendation to screen - Ages 35-79 yo.  Completed  ? HPV Vaccine  Aged Out  ?*Topic was postponed. The date shown is not the original due date.  ? ?Opioid Pain Medicine Management ?Opioids are powerful medicines that are used to treat moderate to severe pain. When used for short periods of time, they can help you to: ?Sleep better. ?Do better in physical or occupational therapy. ?Feel better in the first few days after an injury. ?Recover from surgery. ?Opioids should be taken with the supervision of a trained  health care provider. They should be taken for the shortest period of time possible. This is because opioids can be addictive, and the longer you take opioids, the greater your risk of addiction. This addiction can also be called  opioid use disorder. ?What are the risks? ?Using opioid pain medicines for longer than 3 days increases your risk of side effects. Side effects include: ?Constipation. ?Nausea and vomiting. ?Breathing difficulties (respiratory depression). ?Drowsiness. ?Confusion. ?Opioid use disorder. ?Itching. ?Taking opioid pain medicine for a long period of time can affect your ability to do daily tasks. It also puts you at risk for: ?Motor vehicle crashes. ?Depression. ?Suicide. ?Heart attack. ?Overdose, which can be life-threatening. ?What is a pain treatment plan? ?A pain treatment plan is an agreement between you and your health care provider. Pain is unique to each person, and treatments vary depending on your condition. To manage your pain, you and your health care provider need to work together. To help you do this: ?Discuss the goals of your treatment, including how much pain you might expect to have and how you will manage the pain. ?Review the risks and benefits of taking opioid medicines. ?Remember that a good treatment plan uses more than one approach and minimizes the chance of side effects. ?Be honest about the amount of medicines you take and about any drug or alcohol use. ?Get pain medicine prescriptions from only one health care provider. ?Pain can be managed with many types of alternative treatments. Ask your health care provider to refer you to one or more specialists who can help you manage pain through: ?Physical or occupational therapy. ?Counseling (cognitive behavioral therapy). ?Good nutrition. ?Biofeedback. ?Massage. ?Meditation. ?Non-opioid medicine. ?Following a gentle exercise program. ?How to use opioid pain medicine ?Taking medicine ?Take your pain medicine exactly as told by your health care provider. Take it only when you need it. ?If your pain gets less severe, you may take less than your prescribed dose if your health care provider approves. ?If you are not having pain, do nottake pain medicine  unless your health care provider tells you to take it. ?If your pain is severe, do nottry to treat it yourself by taking more pills than instructed on your prescription. Contact your health care provider for help. ?Write down the times when you take your pain medicine. It is easy to become confused while on pain medicine. Writing the time can help you avoid overdose. ?Take other over-the-counter or prescription medicines only as told by your health care provider. ?Keeping yourself and others safe ? ?While you are taking opioid pain medicine: ?Do not drive, use machinery, or power tools. ?Do not sign legal documents. ?Do not drink alcohol. ?Do not take sleeping pills. ?Do not supervise children by yourself. ?Do not do activities that require climbing or being in high places. ?Do not go to a lake, river, ocean, spa, or swimming pool. ?Do not share your pain medicine with anyone. ?Keep pain medicine in a locked cabinet or in a secure area where pets and children cannot reach it. ?Stopping your use of opioids ?If you have been taking opioid medicine for more than a few weeks, you may need to slowly decrease (taper) how much you take until you stop completely. Tapering your use of opioids can decrease your risk of symptoms of withdrawal, such as: ?Pain and cramping in the abdomen. ?Nausea. ?Sweating. ?Sleepiness. ?Restlessness. ?Uncontrollable shaking (tremors). ?Cravings for the medicine. ?Do not attempt to taper your use of opioids on your own. Talk  with your health care provider about how to do this. Your health care provider may prescribe a step-down schedule based on how much medicine you are taking and how long you have been taking it. ?Getting rid of leftover pills ?Do not save any leftover pills. Get rid of leftover pills safely by: ?Taking the medicine to a prescription take-back program. This is usually offered by the county or law enforcement. ?Bringing them to a pharmacy that has a drug disposal  container. ?Flushing them down the toilet. Check the label or package insert of your medicine to see whether this is safe to do. ?Throwing them out in the trash. Check the label or package insert of your medicine to see w

## 2021-07-05 ENCOUNTER — Telehealth: Payer: Self-pay | Admitting: Pharmacist

## 2021-07-05 NOTE — Chronic Care Management (AMB) (Signed)
Chronic Care Management Pharmacy Assistant   Name: Laurie [name]  MRN: [id number] DOB: [birth year]  Reason for Encounter: Medication Review / Medication Coordination Call   Conditions to be addressed/monitored: HTN  Recent office visits:  06/13/2021 Rolene Arbour LPN - Medicare annual wellness exam  Recent consult visits:  None  Hospital visits:  None  Medications: Outpatient Encounter Medications as of 07/05/2021  Medication Sig   albuterol (VENTOLIN HFA) 108 (90 Base) MCG/ACT inhaler INHALE TWO PUFFS BY MOUTH INTO LUNGS every SIX hours AS NEEDED FOR WHEEZING AND/OR SHORTNESS OF BREATH   aspirin EC 81 MG tablet Take 1 tablet (81 mg total) by mouth daily. Swallow whole.   atorvastatin (LIPITOR) 40 MG tablet TAKE ONE TABLET BY MOUTH EVERY EVENING Needs appointment for further refills   Blood Pressure Monitoring DEVI Use as needed DX I10   clopidogrel (PLAVIX) 75 MG tablet Take 1 tablet (75 mg total) by mouth daily.   fluticasone (FLONASE) 50 MCG/ACT nasal spray USE TWO SPRAYS in each nostril daily   furosemide (LASIX) 20 MG tablet TAKE ONE TABLET BY MOUTH daily AS NEEDED FOR LEG EDEMA   guaiFENesin (MUCINEX) 600 MG 12 hr tablet Take 600 mg by mouth 2 (two) times daily as needed.   losartan (COZAAR) 50 MG tablet TAKE ONE TABLET BY MOUTH EVERY MORNING   Menthol, Topical Analgesic, (BIOFREEZE EX) Apply 1 application topically daily as needed (back pain).   methimazole (TAPAZOLE) 10 MG tablet TAKE TWO TABLETS BY MOUTH EVERY EVENING   metoprolol succinate (TOPROL-XL) 50 MG 24 hr tablet TAKE ONE TABLET BY MOUTH EVERY MORNING   mupirocin ointment (BACTROBAN) 2 % Apply 1 application topically 2 (two) times daily.   nitroGLYCERIN (NITROSTAT) 0.4 MG SL tablet Place 1 tablet (0.4 mg total) under the tongue every 5 (five) minutes as needed for chest pain.   ondansetron (ZOFRAN ODT) 8 MG disintegrating tablet Take 1 tablet (8 mg total) by mouth every 8 (eight) hours as needed for nausea  or vomiting.   oxyCODONE-acetaminophen (PERCOCET) 5-325 MG tablet Take 1-2 tablets by mouth every 4 (four) hours as needed for moderate pain.   pantoprazole (PROTONIX) 40 MG tablet TAKE ONE TABLET BY MOUTH EVERY MORNING   venlafaxine XR (EFFEXOR-XR) 150 MG 24 hr capsule TAKE ONE TABLET BY MOUTH EVERY MORNING   No facility-administered encounter medications on file as of 07/05/2021.   Reviewed chart for medication changes ahead of medication coordination call.  No OVs, Consults, or hospital visits since last care coordination call/Pharmacist visit. (If appropriate, list visit date, provider name)  No medication changes indicated OR if recent visit, treatment plan here.  BP Readings from Last 3 Encounters:  04/25/21 (!) 142/80  03/29/21 (!) 142/93  03/27/21 (!) 165/97    Lab Results  Component Value Date   HGBA1C 5.4 09/13/2018     Patient obtains medications through Adherence Packaging  30 Days    Last adherence delivery included:  Losartan (COZAAR) 50 mg: one tablet with breakfast Methimazole (TAPAZOLE) 10 mg: two tablets at dinner Pantoprazole (PROTONIX) 40 mg: one tablet at breakfast Venlafaxine XR (EFFEXOR-XR) 150 mg: one capsule by mouth with breakfast Clopidogrel (PLAVIX) 75 mg: one tablet at breakfast  Atorvastatin (LIPITOR) 40 mg: one tablet at dinner Aspirin EC 81 mg: one tablet at dinner Metoprolol succinate (TOPROL-XL) 50 MG 24 hr: one tablet at breakfast   Patient declined (meds) last month:    Albuterol HFA : 2 puffs every 6 hours as needed Furosemide 30  mg 1 tablet daily as needed Fluticasone (FLONASE) 50 MCG/ACT nasal spray: Place 2 sprays into both nostrils daily  Patient is due for next adherence delivery on: 07/18/2021   Called patient and reviewed medications and coordinated delivery.   This delivery to include: Losartan 50 mg: one tablet with breakfast Methimazole 10 mg: two tablets at dinner (correction made on pharm form) Pantoprazole 40 mg: one  tablet at breakfast Venlafaxine XR 150 mg: one capsule by mouth with breakfast Clopidogrel 75 mg: one tablet at breakfast  Atorvastatin 40 mg: one tablet at dinner Aspirin EC 81 mg: one tablet at dinner Metoprolol succinate 50 MG 24 hr: one tablet at breakfast Furosemide 30 mg 1 tablet daily as needed Fluticasone 50 MCG/ACT nasal spray: Place 2 sprays into both nostrils daily Albuterol HFA : 2 puffs every 6 hours as needed  Patient will need a short fill:    Coordinated acute fill:    Patient declined the following medications:    Unable to reach patient after several attempts to verify delivery date of 07/18/2021    Care Gaps: AWV - scheduled 06/17/2022 Last BP - 142/80 on 04/25/2021 Last A1C - 5.4 on 09/13/2018 Covid booster - overdue Shingrix - postponed Pneumonia vaccine - postponed Mammogram - postponed Dexa scan - postponed Colonoscopy - postponed Tdap - postponed  Star Rating Drugs: Atorvastatin 40 mg - Last filled 06/06/2021 30 DS at [facility]  Losartan  50 mg - Last filled 06/06/2021 30 DS at [facility] [phone]

## 2021-07-12 ENCOUNTER — Ambulatory Visit: Payer: Medicare Other | Admitting: Nurse Practitioner

## 2021-07-12 NOTE — Progress Notes (Deleted)
Cardiology Office Note:    Date:  07/12/2021   ID:  Laurie [name], DOB 1956-01-11, MRN [id number]  PCP:  Laurie Post, MD   [facility] Providers Cardiologist:  Laurie Chandler, MD { Click to update primary MD,subspecialty MD or APP then REFRESH:1}    Referring MD: Laurie Post, MD   Chief Complaint: ***  History of Present Illness:    [name] is a *** 66 y.o. female with a hx of CAD s/p PCI, hypertension, rheumatoid arthritis, COPD/asthma, GERD, tobacco abuse, and hyperthyroidism.  She established care with Laurie [name] in August 2021 with concerns for chest pain and dyspnea.  Echo in 2019 with LVEF 65%, mild MR.  Cardiac cath 2011 with mild CAD.  Chronically abnormal EKG.  Cardiac cath 10/07/2019 with severe mid circumflex/OM stenosis treated with DES x 2, mild disease in proximal LAD and moderate disease in mid RCA.  LV systolic function was normal.  She had dyspnea on Brilinta, now on aspirin and Plavix.    She was last seen via telemedicine on 02/28/2020 by Laurie [name] at which time she reported she was being treated with Tapazole for Graves' disease.  No changes were made to her medical regimen and 55-monthfollow-up was recommended.  Today, she is here   Lipid panel  Past Medical History:  Diagnosis Date   Allergy    Anxiety    Arthritis    RA   Asthma    Chronic back pain    COPD (chronic obstructive pulmonary disease) (HCC)    Depression    Esophageal stricture    GERD (gastroesophageal reflux disease)    Heart murmur    Hematemesis 09/10/2018   Hyperlipidemia    Hypertension    IBS (irritable bowel syndrome)    Interstitial cystitis    Neuromuscular disorder (HCC)    fibromyalgia   PONV (postoperative nausea and vomiting)     Past Surgical History:  Procedure Laterality Date   ABDOMINAL HYSTERECTOMY     APPENDECTOMY     BIOPSY  09/11/2018   Procedure: BIOPSY;  Surgeon: Laurie Copas, MD;  [facility]   Service: Gastroenterology;;   CERVICAL FUSION     CERVICAL LAMINECTOMY     CORONARY STENT INTERVENTION N/A 10/07/2019   Procedure: CORONARY STENT INTERVENTION;  Surgeon: MBurnell Blanks MD;  [facility];  Service: Cardiovascular;  Laterality: N/A;   ESOPHAGOGASTRODUODENOSCOPY     ESOPHAGOGASTRODUODENOSCOPY (EGD) WITH PROPOFOL N/A 09/11/2018   Procedure: ESOPHAGOGASTRODUODENOSCOPY (EGD) WITH PROPOFOL;  Surgeon: MRush LandmarkGTelford Nab, MD;  [facility] [facility]  Service: Gastroenterology;  Laterality: N/A;   FINGER SURGERY     KYPHOPLASTY N/A 03/29/2021   Procedure: Lumbar Three KYPHOPLASTY;  Surgeon: EKristeen Miss MD;  [place] [facility]  Service: Neurosurgery;  Laterality: N/A;   LEFT HEART CATH AND CORONARY ANGIOGRAPHY N/A 10/07/2019   Procedure: LEFT HEART CATH AND CORONARY ANGIOGRAPHY;  Surgeon: MBurnell Blanks MD;  [facility];  Service: Cardiovascular;  Laterality: N/A;   LUMBAR FUSION     SAVORY DILATION N/A 09/11/2018   Procedure: SAVORY DILATION;  Surgeon: MRush LandmarkGTelford Nab, MD;  [facility]  Service: Gastroenterology;  Laterality: N/A;   TONSILLECTOMY      Current Medications: No outpatient medications have been marked as taking for the 07/12/21 encounter (Appointment) with SAnn [name] MLanice Schwab NP.     Allergies:   Codeine, Lisinopril, Sulfonamide derivatives, and Tetracyclines & related   Social History  Socioeconomic History   Marital status: Single    Spouse name: Not on file   Number of children: Not on file   Years of education: Not on file   Highest education level: Not on file  Occupational History   Occupation: disability  Tobacco Use   Smoking status: Every Day    Packs/day: 0.50    Types: Cigarettes   Smokeless tobacco: Never   Tobacco comments:    trying to quit   Vaping Use   Vaping Use: Never used  Substance and Sexual Activity   Alcohol use: Yes    Comment: occasional   Drug use:  Yes    Types: Marijuana   Sexual activity: Not on file  Other Topics Concern   Not on file  Social History Narrative   Not on file   Social Determinants of Health   Financial Resource Strain: Low Risk    Difficulty of Paying Living Expenses: Not hard at all  Food Insecurity: No Food Insecurity   Worried About Charity fundraiser in the Last Year: Never true   [place] in the Last Year: Never true  Transportation Needs: No Transportation Needs   Lack of Transportation (Medical): No   Lack of Transportation (Non-Medical): No  Physical Activity: Insufficiently Active   Days of Exercise per Week: 3 days   Minutes of Exercise per Session: 10 min  Stress: No Stress Concern Present   Feeling of Stress : Only a little  Social Connections: Moderately Integrated   Frequency of Communication with Friends and Family: More than three times a week   Frequency of Social Gatherings with Friends and Family: More than three times a week   Attends Religious Services: More than 4 times per year   Active Member of [employer] or Organizations: Yes   Attends Music therapist: More than 4 times per year   Marital Status: Divorced     Family History: The patient's ***family history includes Aneurysm in her brother; Cancer in her brother; Coronary artery disease in her father, paternal aunt, and paternal grandmother; Dementia in her mother; Esophageal cancer in her brother; Heart attack in her father; Stomach cancer in her paternal aunt. There is no history of Rectal cancer, Colon cancer, or Thyroid disease.  ROS:   Please see the history of present illness.    *** All other systems reviewed and are negative.  Labs/Other Studies Reviewed:    The following studies were reviewed today:  LHC 10/07/19  Mid RCA lesion is 50% stenosed. 2nd Mrg lesion is 80% stenosed. Mid Cx lesion is 90% stenosed. Prox LAD lesion is 40% stenosed. A drug-eluting stent was successfully placed using a  [place] [id number]. [name] intervention, there is a 0% residual stenosis. A drug-eluting stent was successfully placed using a [place] [id number]. [name] intervention, there is a 0% residual stenosis. The left ventricular systolic function is normal. LV end diastolic pressure is normal. There is no mitral valve regurgitation. The left ventricular ejection fraction is 55-65% by visual estimate.   1. Mild non-obstructive disease in the proximal to mid LAD 2. Severe stenosis mid Circumflex into OM1 3. Successful PTCA/DES x 2 Mid Circumflex into OM1 4. Moderate non-obstructive disease in the mid RCA 5. Normal LV systolic function.    Recommendations: Continue DAPT with ASA and Brilinta for at least 6 months [name] stenting.  Continue statin. Consider beta blocker therapy at follow up. Same day discharge [name] PCI if stable.  Vas [place] LE Venous 09/14/18  Right: There is no evidence of deep vein thrombosis in the lower  extremity.  Left: No evidence of common femoral vein obstruction.    Echo 11/06/17  Left ventricle: The cavity size was normal. There was mild    concentric hypertrophy. Systolic function was vigorous. The    estimated ejection fraction was in the range of 65% to 70%. Wall    motion was normal; there were no regional wall motion    abnormalities. Left ventricular diastolic function parameters    were normal.  - Aortic valve: Valve area (VTI): 2.38 cm^2. Valve area (Vmax):    2.41 cm^2. Valve area (Vmean): 2.45 cm^2.  - Aortic root: The aortic root was normal in size.  - Mitral valve: Structurally normal valve. There was mild    regurgitation.  - Left atrium: The atrium was at the upper limits of normal in    size.  - Right ventricle: The cavity size was normal. Wall thickness was    normal. Systolic function was normal.  - Right atrium: The atrium was normal in size.  - Tricuspid valve: There was mild regurgitation.  - Pulmonary arteries: Systolic pressure  was within the normal    range.  - Inferior vena cava: The vessel was normal in size.  - Pericardium, extracardiac: There was no pericardial effusion.   Recent Labs: 02/09/2021: ALT 14; Pro B Natriuretic peptide (BNP) 51.0 03/29/2021: BUN 17; Creatinine, Ser 0.91; Hemoglobin 12.0; Platelets 226; Potassium 4.4; Sodium 137 04/25/2021: TSH 2.94  Recent Lipid Panel    Component Value Date/Time   CHOL 159 09/03/2019 1353   TRIG 227 (H) 09/03/2019 1353   HDL 46 (L) 09/03/2019 1353   CHOLHDL 3.5 09/03/2019 1353   VLDL 36.0 07/23/2019 1441   LDLCALC 81 09/03/2019 1353   LDLDIRECT 123.0 03/14/2017 0948     Risk Assessment/Calculations:   {Does this patient have ATRIAL FIBRILLATION?[phone]}       Physical Exam:    VS:  There were no vitals taken for this visit.    Wt Readings from Last 3 Encounters:  06/13/21 180 lb (81.6 kg)  04/25/21 184 lb 9.6 oz (83.7 kg)  03/29/21 180 lb (81.6 kg)     GEN: *** Well nourished, well developed in no acute distress HEENT: Normal NECK: No JVD; No carotid bruits CARDIAC: ***RRR, no murmurs, rubs, gallops RESPIRATORY:  Clear to auscultation without rales, wheezing or rhonchi  ABDOMEN: Soft, non-tender, non-distended MUSCULOSKELETAL:  No edema; No deformity. *** pedal pulses, ***bilaterally SKIN: Warm and dry NEUROLOGIC:  Alert and oriented x 3 PSYCHIATRIC:  Normal affect   EKG:  EKG is *** ordered today.  The ekg ordered today demonstrates ***  Diagnoses:    No diagnosis found. Assessment and Plan:     CAD Hypertension: Hyperlipidemia LDL goal < 70: LDL 81 09/03/19.  Triglycerides were elevated at 227   Disposition:   {Are you ordering a CV Procedure (Laurie.g. stress test, cath, DCCV, TEE, etc)?   Press F2        :[id number]}    Medication Adjustments/Labs and Tests Ordered: Current medicines are reviewed at length with the patient today.  Concerns regarding medicines are outlined above.  No orders of the defined types were placed  in this encounter.  No orders of the defined types were placed in this encounter.   There are no Patient Instructions on file for this visit.   Signed, Emmaline Life, NP  07/12/2021 8:23 AM  [facility]

## 2021-08-03 ENCOUNTER — Other Ambulatory Visit: Payer: Self-pay | Admitting: Cardiovascular Disease

## 2021-08-03 ENCOUNTER — Other Ambulatory Visit: Payer: Self-pay | Admitting: Family Medicine

## 2021-08-03 ENCOUNTER — Telehealth: Payer: Self-pay | Admitting: Pharmacist

## 2021-08-13 ENCOUNTER — Other Ambulatory Visit: Payer: Self-pay | Admitting: Cardiovascular Disease

## 2021-08-24 ENCOUNTER — Ambulatory Visit: Payer: Medicare Other | Admitting: Internal Medicine

## 2021-08-24 NOTE — Progress Notes (Deleted)
Patient ID: [name], female   DOB: 12-Jan-1956, 66 y.o.   MRN: [id number]  HPI  SUMAIYA ARRUDA is a 66 y.o.-year-old female, returning for follow-up for thyrotoxicosis.  She previously saw Dr. Loanne Drilling, last visit with him 4 months ago.  Patient was diagnosed with thyrotoxicosis in 2021 and started on methimazole.  She currently takes 20 mg at night.  No other investigation is available.  At last visit with Dr. Loanne Drilling in 04/2021, TFTs were normal so the same dose of methimazole was continued.  She tolerates it well.  I reviewed pt's thyroid tests: Lab Results  Component Value Date   TSH 2.94 04/25/2021   TSH 0.04 (L) 04/26/2020   TSH <0.01 Repeated and verified X2. (L) 03/13/2020   TSH <0.01 (L) 12/27/2019   TSH 0.15 (L) 09/03/2019   TSH 0.33 (L) 07/23/2019   TSH 1.704 09/13/2018   TSH 0.363 11/05/2017   TSH 1.69 03/14/2017   TSH 2.67 08/18/2015   FREET4 0.69 04/25/2021   FREET4 0.69 04/26/2020   FREET4 1.56 03/13/2020   FREET4 2.0 (H) 12/27/2019   FREET4 1.3 09/03/2019   No results found for: "T3FREE"  Antithyroid antibodies: No results found for: "TSI"  Neck U/S (03/22/2014): 1.7 cm jugular chain lymph node with fatty hilum, within normal limits for size  Chest CT (03/27/2021): Enlarged and heterogeneous thyroid gland-recommended ultrasound  Pt denies: - feeling nodules in neck - hoarseness - dysphagia - choking - SOB with lying down  She denies: - fatigue - excessive sweating/heat intolerance - tremors - anxiety - palpitations - hyperdefecation - weight loss - hair loss  Pt does not have a FH of thyroid ds. No FH of thyroid cancer. No h/o radiation tx to head or neck. No seaweed or kelp, no recent contrast studies. No steroid use. No herbal supplements. No Biotin use.  Pt. also has a history of rheumatoid arthritis, HTN, HL, fibromyalgia, chronic back pain, DDD, COPD, esophageal stricture, IBS, interstitial cystitis, depression.  She is on  metoprolol.  ROS: Constitutional: + see HPI  Past Medical History:  Diagnosis Date   Allergy    Anxiety    Arthritis    RA   Asthma    Chronic back pain    COPD (chronic obstructive pulmonary disease) (HCC)    Depression    Esophageal stricture    GERD (gastroesophageal reflux disease)    Heart murmur    Hematemesis 09/10/2018   Hyperlipidemia    Hypertension    IBS (irritable bowel syndrome)    Interstitial cystitis    Neuromuscular disorder (HCC)    fibromyalgia   PONV (postoperative nausea and vomiting)    Past Surgical History:  Procedure Laterality Date   ABDOMINAL HYSTERECTOMY     APPENDECTOMY     BIOPSY  09/11/2018   Procedure: BIOPSY;  Surgeon: Irving Copas., MD;  Location: [place];  Service: Gastroenterology;;   CERVICAL FUSION     CERVICAL LAMINECTOMY     CORONARY STENT INTERVENTION N/A 10/07/2019   Procedure: CORONARY STENT INTERVENTION;  Surgeon: Burnell Blanks, MD;  Location: [place] [facility];  Service: Cardiovascular;  Laterality: N/A;   ESOPHAGOGASTRODUODENOSCOPY     ESOPHAGOGASTRODUODENOSCOPY (EGD) WITH PROPOFOL N/A 09/11/2018   Procedure: ESOPHAGOGASTRODUODENOSCOPY (EGD) WITH PROPOFOL;  Surgeon: Rush Landmark Telford Nab., MD;  Location: [place];  Service: Gastroenterology;  Laterality: N/A;   FINGER SURGERY     KYPHOPLASTY N/A 03/29/2021   Procedure: Lumbar Three KYPHOPLASTY;  Surgeon: Kristeen Miss, MD;  Location: [place] [facility];  Service: Neurosurgery;  Laterality: N/A;   LEFT HEART CATH AND CORONARY ANGIOGRAPHY N/A 10/07/2019   Procedure: LEFT HEART CATH AND CORONARY ANGIOGRAPHY;  Surgeon: Burnell Blanks, MD;  Location: [place] [facility];  Service: Cardiovascular;  Laterality: N/A;   LUMBAR FUSION     SAVORY DILATION N/A 09/11/2018   Procedure: SAVORY DILATION;  Surgeon: Rush Landmark Telford Nab., MD;  Location: [place];  Service: Gastroenterology;  Laterality: N/A;   TONSILLECTOMY     Social History   Socioeconomic  History   Marital status: Single    Spouse name: Not on file   Number of children: Not on file   Years of education: Not on file   Highest education level: Not on file  Occupational History   Occupation: disability  Tobacco Use   Smoking status: Every Day    Packs/day: 0.50    Types: Cigarettes   Smokeless tobacco: Never   Tobacco comments:    trying to quit   Vaping Use   Vaping Use: Never used  Substance and Sexual Activity   Alcohol use: Yes    Comment: occasional   Drug use: Yes    Types: Marijuana   Sexual activity: Not on file  Other Topics Concern   Not on file  Social History Narrative   Not on file   Social Determinants of Health   Financial Resource Strain: Low Risk  (06/13/2021)   Overall Financial Resource Strain (CARDIA)    Difficulty of Paying Living Expenses: Not hard at all  Food Insecurity: No Food Insecurity (06/13/2021)   Hunger Vital Sign    Worried About Running Out of Food in the Last Year: Never true    [place] in the Last Year: Never true  Transportation Needs: No Transportation Needs (06/13/2021)   PRAPARE - Hydrologist (Medical): No    Lack of Transportation (Non-Medical): No  Physical Activity: Insufficiently Active (06/13/2021)   Exercise Vital Sign    Days of Exercise per Week: 3 days    Minutes of Exercise per Session: 10 min  Stress: No Stress Concern Present (06/13/2021)   [place]    Feeling of Stress : Only a little  Social Connections: Moderately Integrated (06/13/2021)   Social Connection and Isolation Panel [NHANES]    Frequency of Communication with Friends and Family: More than three times a week    Frequency of Social Gatherings with Friends and Family: More than three times a week    Attends Religious Services: More than 4 times per year    Active Member of [employer] or Organizations: Yes    Attends Music therapist: More  than 4 times per year    Marital Status: Divorced  Intimate Partner Violence: Not At Risk (06/13/2021)   Humiliation, Afraid, Rape, and Kick questionnaire    Fear of Current or Ex-Partner: No    Emotionally Abused: No    Physically Abused: No    Sexually Abused: No   Current Outpatient Medications on File Prior to Visit  Medication Sig Dispense Refill   albuterol (VENTOLIN HFA) 108 (90 Base) MCG/ACT inhaler INHALE TWO PUFFS BY MOUTH INTO LUNGS every SIX hours AS NEEDED FOR WHEEZING AND/OR SHORTNESS OF BREATH 8.5 g 3   aspirin EC 81 MG tablet Take 1 tablet (81 mg total) by mouth daily. Swallow whole. 90 tablet 3   atorvastatin (LIPITOR) 40 MG tablet TAKE  ONE TABLET BY MOUTH EVERY EVENING Needs appointment for further refills 90 tablet 0   Blood Pressure Monitoring DEVI Use as needed DX I10 1 each 1   clopidogrel (PLAVIX) 75 MG tablet Take 1 tablet (75 mg total) by mouth every morning. Please call [phone] to schedule an overdue appointment for future refills. Thank you. 1st attempt. 30 tablet 0   fluticasone (FLONASE) 50 MCG/ACT nasal spray USE TWO SPRAYS in each nostril daily 16 g 6   furosemide (LASIX) 20 MG tablet TAKE ONE TABLET BY MOUTH daily AS NEEDED FOR LEG EDEMA 30 tablet 3   guaiFENesin (MUCINEX) 600 MG 12 hr tablet Take 600 mg by mouth 2 (two) times daily as needed.     losartan (COZAAR) 50 MG tablet TAKE ONE TABLET BY MOUTH EVERY MORNING 90 tablet 3   Menthol, Topical Analgesic, (BIOFREEZE EX) Apply 1 application topically daily as needed (back pain).     methimazole (TAPAZOLE) 10 MG tablet TAKE TWO TABLETS BY MOUTH EVERY EVENING 180 tablet 1   metoprolol succinate (TOPROL-XL) 50 MG 24 hr tablet TAKE ONE TABLET BY MOUTH EVERY MORNING 90 tablet 3   mupirocin ointment (BACTROBAN) 2 % Apply 1 application topically 2 (two) times daily. 22 g 0   nitroGLYCERIN (NITROSTAT) 0.4 MG SL tablet Place 1 tablet (0.4 mg total) under the tongue every 5 (five) minutes as needed for chest pain. 25  tablet 1   ondansetron (ZOFRAN ODT) 8 MG disintegrating tablet Take 1 tablet (8 mg total) by mouth every 8 (eight) hours as needed for nausea or vomiting. 15 tablet 0   oxyCODONE-acetaminophen (PERCOCET) 5-325 MG tablet Take 1-2 tablets by mouth every 4 (four) hours as needed for moderate pain. 20 tablet 0   pantoprazole (PROTONIX) 40 MG tablet TAKE ONE TABLET BY MOUTH EVERY MORNING 90 tablet 0   venlafaxine XR (EFFEXOR-XR) 150 MG 24 hr capsule TAKE ONE TABLET BY MOUTH EVERY MORNING 30 capsule 6   No current facility-administered medications on file prior to visit.   Allergies  Allergen Reactions   Codeine Nausea And Vomiting   Lisinopril Cough   Sulfonamide Derivatives Nausea And Vomiting   Tetracyclines & Related Rash   Family History  Problem Relation Age of Onset   Dementia Mother    Heart attack Father    Coronary artery disease Father    Cancer Brother        esophageal   Esophageal cancer Brother    Coronary artery disease Paternal Aunt    Stomach cancer Paternal Aunt    Coronary artery disease Paternal Grandmother    Aneurysm Brother        aortic   Rectal cancer Neg Hx    Colon cancer Neg Hx    Thyroid disease Neg Hx    PE: There were no vitals taken for this visit. Wt Readings from Last 3 Encounters:  06/13/21 180 lb (81.6 kg)  04/25/21 184 lb 9.6 oz (83.7 kg)  03/29/21 180 lb (81.6 kg)   Constitutional: overweight, in NAD Eyes: PERRLA, EOMI, no exophthalmos, no lid lag, no stare ENT: moist mucous membranes, no thyromegaly, no thyroid bruits, no cervical lymphadenopathy Cardiovascular: RRR, No MRG Respiratory: CTA B Musculoskeletal: no deformities, strength intact in all 4 Skin: moist, warm, no rashes Neurological: no tremor with outstretched hands, DTR normal in all 4  ASSESSMENT: 1. Thyrotoxicosis  2.  Goiter -As seen on chest CT  PLAN:  1. Patient with a 2-year history of low TSH, with thyrotoxic  sxs: weight loss, heat intolerance, hyperdefecation,  palpitations, anxiety.  - she does not appear to have exogenous causes for the low TSH.  - We discussed that possible causes of thyrotoxicosis are:  Graves ds   Thyroiditis toxic multinodular goiter/ toxic adenoma (I cannot feel nodules at palpation of her thyroid but she does have an enlarged thyroid per review of recent CT scan report). - will check the TSH, fT3 and fT4 and also add thyroid stimulating antibodies to screen for Graves' disease.  - she is currently on methimazole 20 mg in the evening.  She tolerates this well. - we discussed about possible modalities of treatment for the above conditions, to include continuing methimazole use, radioactive iodine ablation or (last resort) surgery.  For now, we decided to continue methimazole.  I am hoping that we can decrease the dose in the near future. - she is not tachycardic or tremulous at today's visit.  She is on metoprolol, though. - no signs of Graves' ophthalmopathy: she does not have any double vision, blurry vision, eye pain, chemosis. - RTC in 4 months, but likely sooner for repeat labs  2.  Goiter -Her thyroid was enlarged and appeared heterogeneous on the recent chest CT -We discussed that this could be related to Graves' disease or thyroid nodules -At today's visit, we discussed about checking a thyroid ultrasound, however, if her TFTs are abnormal, I would suggest to go ahead with a thyroid uptake and scan first to identify hot/cold nodules.  We discussed that hot or warm nodules are less likely to be malignant compared to cold nodules.  Philemon Kingdom, MD PhD [facility] Endocrin[facility]

## 2021-09-04 ENCOUNTER — Other Ambulatory Visit: Payer: Self-pay | Admitting: Cardiovascular Disease

## 2021-09-04 ENCOUNTER — Other Ambulatory Visit: Payer: Self-pay | Admitting: Family Medicine

## 2021-09-04 ENCOUNTER — Telehealth: Payer: Self-pay | Admitting: Family Medicine

## 2021-09-04 NOTE — Telephone Encounter (Addendum)
Pt called this morning to ask if she could switch from one provider to another.  Pt's last OV: 02/09/21.  Pt would like to start seeing NP [name].  Please advise.

## 2021-09-04 NOTE — Telephone Encounter (Signed)
Ok to transfer. 

## 2021-09-05 ENCOUNTER — Telehealth: Payer: Self-pay | Admitting: Pharmacist

## 2021-09-05 ENCOUNTER — Telehealth: Payer: Self-pay | Admitting: Cardiovascular Disease

## 2021-09-05 MED ORDER — CLOPIDOGREL BISULFATE 75 MG PO TABS: 75.0000 mg | ORAL_TABLET | Freq: Every day | ORAL | 0 refills | Status: DC

## 2021-09-05 NOTE — Chronic Care Management (AMB) (Signed)
Chronic Care Management Pharmacy Assistant   Name: Laurie [name]  MRN: [id number] DOB: 1955-11-05  Reason for Encounter: Medication Review / Medication Coordination Call  Recent office visits:  None  Recent consult visits:  None  Hospital visits:  None  Medications: Outpatient Encounter Medications as of 09/05/2021  Medication Sig   albuterol (VENTOLIN HFA) 108 (90 Base) MCG/ACT inhaler INHALE TWO PUFFS BY MOUTH INTO LUNGS every SIX hours AS NEEDED FOR WHEEZING AND/OR SHORTNESS OF BREATH   aspirin EC 81 MG tablet Take 1 tablet (81 mg total) by mouth daily. Swallow whole.   atorvastatin (LIPITOR) 40 MG tablet TAKE ONE TABLET BY MOUTH EVERY EVENING Needs appointment for further refills   Blood Pressure Monitoring DEVI Use as needed DX I10   clopidogrel (PLAVIX) 75 MG tablet Take 1 tablet (75 mg total) by mouth daily.   fluticasone (FLONASE) 50 MCG/ACT nasal spray USE TWO SPRAYS in each nostril daily   furosemide (LASIX) 20 MG tablet TAKE ONE TABLET BY MOUTH daily AS NEEDED FOR LEG EDEMA   guaiFENesin (MUCINEX) 600 MG 12 hr tablet Take 600 mg by mouth 2 (two) times daily as needed.   losartan (COZAAR) 50 MG tablet TAKE ONE TABLET BY MOUTH EVERY MORNING   Menthol, Topical Analgesic, (BIOFREEZE EX) Apply 1 application topically daily as needed (back pain).   methimazole (TAPAZOLE) 10 MG tablet TAKE TWO TABLETS BY MOUTH EVERY EVENING   metoprolol succinate (TOPROL-XL) 50 MG 24 hr tablet TAKE ONE TABLET BY MOUTH EVERY MORNING   mupirocin ointment (BACTROBAN) 2 % Apply 1 application topically 2 (two) times daily.   nitroGLYCERIN (NITROSTAT) 0.4 MG SL tablet Place 1 tablet (0.4 mg total) under the tongue every 5 (five) minutes as needed for chest pain.   ondansetron (ZOFRAN ODT) 8 MG disintegrating tablet Take 1 tablet (8 mg total) by mouth every 8 (eight) hours as needed for nausea or vomiting.   oxyCODONE-acetaminophen (PERCOCET) 5-325 MG tablet Take 1-2 tablets by mouth every 4  (four) hours as needed for moderate pain.   pantoprazole (PROTONIX) 40 MG tablet TAKE ONE TABLET BY MOUTH EVERY MORNING   venlafaxine XR (EFFEXOR-XR) 150 MG 24 hr capsule TAKE ONE TABLET BY MOUTH EVERY MORNING   No facility-administered encounter medications on file as of 09/05/2021.  Reviewed chart for medication changes ahead of medication coordination call.  No OVs, Consults, or hospital visits since last care coordination call/Pharmacist visit. (If appropriate, list visit date, provider name)  No medication changes indicated OR if recent visit, treatment plan here.  BP Readings from Last 3 Encounters:  04/25/21 (!) 142/80  03/29/21 (!) 142/93  03/27/21 (!) 165/97    Lab Results  Component Value Date   HGBA1C 5.4 09/13/2018     Patient obtains medications through Adherence Packaging  30 Days    Last adherence delivery included:  Aspirin EC 81 mg: one tablet at dinner Furosemide 30 mg 1 tablet daily as needed Venlafaxine XR 150 mg: one capsule by mouth with breakfast Metoprolol succinate 50 MG 24 hr: one tablet at breakfast Losartan 50 mg: one tablet with breakfast Fluticasone 50 MCG/ACT nasal spray: Place 2 sprays into both nostrils daily Albuterol HFA : 2 puffs every 6 hours as needed Methimazole 10 mg: two tablets at dinner  Pantoprazole 40 mg: one tablet at breakfast Clopidogrel 75 mg: one tablet at breakfast  Atorvastatin 40 mg: one tablet at dinner   Patient declined (meds) last month:       Patient  is due for next adherence delivery on: 09/22/2021   Called patient and reviewed medications and coordinated delivery.   This delivery to include: Aspirin EC 81 mg: one tablet at dinner Furosemide 20 mg 1 tablet daily as needed Venlafaxine XR 150 mg: one capsule by mouth with breakfast Metoprolol succinate 50 MG 24 hr: one tablet at breakfast Losartan 50 mg: one tablet with breakfast Fluticasone 50 MCG/ACT nasal spray: Place 2 sprays into both nostrils  daily Albuterol HFA : 2 puffs every 6 hours as needed Methimazole 10 mg: two tablets at dinner  Pantoprazole 40 mg: one tablet at breakfast Clopidogrel 75 mg: one tablet at breakfast  Atorvastatin 40 mg: one tablet at dinner   Patient will need a short fill: No short fills   Coordinated acute fill: No acute fills   Patient declined the following medications: No declined meds   Confirmed delivery date of 09/22/2021, advised patient that pharmacy will contact them the morning of delivery. Patient has requested her delivery be 09/22/2021 when she gets paid. She states she has enough extra of all her meds to take her through 09/22/2021  Care Gaps: AWV - scheduled 06/17/2022 Last BP - 142/80 on 04/25/2021 Last A1C - 5.4 on 09/13/2018 Covid booster - overdue Shingrix - postponed Pneumonia vaccine - postponed Mammogram - postponed Dexa scan - postponed Colonoscopy - postponed Tdap - postponed  Star Rating Drugs: Atorvastatin 40 mg - last filled 08/13/2021 30 DS at [facility]  50 mg - last filled 08/13/2021 30 DS at [facility] [phone]

## 2021-09-05 NOTE — Telephone Encounter (Signed)
PT MADE UPCOMING APPOINTMENT.

## 2021-09-05 NOTE — Telephone Encounter (Signed)
Patient notified of update  and verbalized understanding. Pt will call back to schedule an appt.

## 2021-09-05 NOTE — Telephone Encounter (Signed)
*  STAT* If patient is at the pharmacy, call can be transferred to refill team.   1. Which medications need to be refilled? (please list name of each medication and dose if known)   clopidogrel (PLAVIX) 75 MG tablet  2. Which pharmacy/location (including street and city if local pharmacy) is medication to be sent to?  [facility] - [place], [place] - [place] [address]  3. Do they need a 30 day or 90 day supply?   90 day   Patient has enough medication for 15 days.  Patient has appointment on 09/25/21.

## 2021-09-17 ENCOUNTER — Telehealth: Payer: Self-pay | Admitting: Pharmacist

## 2021-09-17 NOTE — Chronic Care Management (AMB) (Signed)
    Chronic Care Management Pharmacy Assistant   Name: JAEMARIE HOCHBERG  MRN: [id number] DOB: 11-12-1955  09/18/2021 APPOINTMENT [place], No answer, left message of appointment on 09/18/2021 at 4:00 via telephone visit with Jeni Salles, Pharm D. Notified to have all medications, supplements, blood pressure and/or blood sugar logs available during appointment and to return call if need to reschedule.  Care Gaps: AWV - scheduled 06/17/2022 Last BP - 142/80 on 04/25/2021 Last A1C - 5.4 on 09/13/2018 Shingrix - never done Covid booster - overdue Flu - due Pneumonia vaccine - postponed Mammogram - postponed Dexa scan - postponed Colonoscopy - postponed Tdap - postponed  Star Rating Drug: Atorvastatin 40 mg - last filled 08/13/2021 30 DS at [facility]  50 mg - last filled 08/13/2021 30 DS at [facility]  Any gaps in medications fill history? No  Gennie Alma [facility]  Catering manager [phone]

## 2021-09-18 ENCOUNTER — Ambulatory Visit (INDEPENDENT_AMBULATORY_CARE_PROVIDER_SITE_OTHER): Payer: Medicare Other | Admitting: Pharmacist

## 2021-09-18 DIAGNOSIS — I10 Essential (primary) hypertension: Secondary | ICD-10-CM

## 2021-09-18 DIAGNOSIS — I25110 Atherosclerotic heart disease of native coronary artery with unstable angina pectoris: Secondary | ICD-10-CM

## 2021-09-18 NOTE — Progress Notes (Signed)
Chronic Care Management Pharmacy Note  09/21/2021 Name:  Laurie [name] MRN:  [id number] DOB:  Jun 25, 1955  Summary: Patient has been under a lot of stress Pt is overdue for annual exam and routine screenings  Recommendations/Changes made from today's visit: -Recommended setting up a visit with behavioral health and provided phone number to call [phone]) -Recommended follow up with PCP for annual exam -Provided telephone number for gastroenterology to schedule colonoscopy and GI breast center for DEXA scan  Plan: Set up PCP visit for TOC Follow up in 3 months  Subjective: Laurie [name] is an 66 y.o. year old female who is a primary patient of Burchette, Alinda Sierras, MD.  The CCM team was consulted for assistance with disease management and care coordination needs.    Engaged with patient by telephone for follow up visit in response to provider referral for pharmacy case management and/or care coordination services.   Consent to Services:  The patient was given information about Chronic Care Management services, agreed to services, and gave verbal consent prior to initiation of services.  Please see initial visit note for detailed documentation.   Patient Care Team: [name], MD as PCP - General (Family Medicine) Burnell Blanks, MD as PCP - Cardiology (Cardiology) Viona Gilmore, [facility] as Pharmacist (Pharmacist)  Recent office visits: 02/09/2021 Laurie Littler MD - Patient was seen for bilateral leg edema and additional issues. Started Furosemide 20 mg daily as needed for leg edema. Follow up in 3 weeks.   Recent consult visits: 04/25/2021 Laurie Shin MD (endocrinology) - Patient was seen for hyperthyroidism. No medication changes. Follow up in 4 months.   02/28/20 Laurie Chandler, MD (cardiology): Patient presented for CAD follow up.   Hospital visits: Medication Reconciliation was completed by comparing discharge summary, patient's EMR and  Pharmacy list, and upon discussion with patient.   Patient was seen at [facility] ED on 03/27/2021 for 3 hour due to fall in home.  New?Medications Started at [facility] Discharge:?? oxyCODONE-acetaminophen 5-325 MG tablet Take 1-2 tablets by mouth every 4 (four) hours as needed for moderate pain. Medication Changes at Hospital Discharge: No medication changes Medications Discontinued at Hospital Discharge: No medications discontinued Medications that remain the same after Hospital Discharge:??  -All other medications will remain the same.    Objective:  Lab Results  Component Value Date   CREATININE 0.91 03/29/2021   BUN 17 03/29/2021   GFR 73.56 02/09/2021   GFRNONAA >60 03/29/2021   GFRAA 76 09/27/2019   NA 137 03/29/2021   K 4.4 03/29/2021   CALCIUM 8.8 (L) 03/29/2021   CO2 25 03/29/2021    Lab Results  Component Value Date/Time   HGBA1C 5.4 09/13/2018 03:11 AM   GFR 73.56 02/09/2021 09:01 AM   GFR 69.04 02/23/2020 05:01 PM   MICROALBUR 2.7 (H) 02/09/2021 09:01 AM    Last diabetic Eye exam: No results found for: "HMDIABEYEEXA"  Last diabetic Foot exam: No results found for: "HMDIABFOOTEX"   Lab Results  Component Value Date   CHOL 159 09/03/2019   HDL 46 (L) 09/03/2019   LDLCALC 81 09/03/2019   LDLDIRECT 123.0 03/14/2017   TRIG 227 (H) 09/03/2019   CHOLHDL 3.5 09/03/2019       Latest Ref Rng & Units 02/09/2021    9:01 AM 12/30/2020    9:00 PM 02/23/2020    5:01 PM  Hepatic Function  Total Protein 6.0 - 8.3 g/dL 7.2  7.4  6.7   Albumin 3.5 -  5.2 g/dL 4.1  4.3  3.7   AST 0 - 37 U/L _0 ALT 0 - 35 U/L _1 Alk Phosphatase 39 - 117 U/L 111  107  77   Total Bilirubin 0.2 - 1.2 mg/dL 0.3  0.3  0.3     Lab Results  Component Value Date/Time   TSH 2.94 04/25/2021 11:51 AM   TSH 0.04 (L) 04/26/2020 09:49 AM   FREET4 0.69 04/25/2021 11:51 AM   FREET4 0.69 04/26/2020 09:49 AM       Latest Ref Rng & Units 03/29/2021    1:09 PM  12/30/2020    9:00 PM 02/23/2020    5:01 PM  CBC  WBC 4.0 - 10.5 K/uL 8.6  9.5  6.9   Hemoglobin 12.0 - 15.0 g/dL 12.0  12.9  11.9   Hematocrit 36.0 - 46.0 % 38.1  39.9  35.3   Platelets 150 - 400 K/uL 226  224  206.0     Lab Results  Component Value Date/Time   VD25OH 38.94 03/14/2017 09:48 AM   VD25OH 16.55 (L) 08/18/2015 08:36 AM    Clinical ASCVD: Yes  The 10-year ASCVD risk score (Arnett DK, et al., 2019) is: 21.7%   Values used to calculate the score:     Age: 24 years     Sex: Female     Is Non-Hispanic African American: No     Diabetic: No     Tobacco smoker: Yes     Systolic Blood Pressure: 540 mmHg     Is BP treated: Yes     HDL Cholesterol: 46 mg/dL     Total Cholesterol: 159 mg/dL       06/13/2021    1:32 PM 11/24/2019    9:24 AM  Depression screen PHQ 2/9  Decreased Interest 1 3  Down, Depressed, Hopeless 0 2  PHQ - 2 Score 1 5  Altered sleeping  3  Tired, decreased energy  3  Change in appetite  1  Feeling bad or failure about yourself   2  Trouble concentrating  2  Moving slowly or fidgety/restless  2  Suicidal thoughts  1  PHQ-9 Score  19     Social History   Tobacco Use  Smoking Status Every Day   Packs/day: 0.50   Types: Cigarettes  Smokeless Tobacco Never  Tobacco Comments   trying to quit    BP Readings from Last 3 Encounters:  04/25/21 (!) 142/80  03/29/21 (!) 142/93  03/27/21 (!) 165/97   Pulse Readings from Last 3 Encounters:  04/25/21 75  03/29/21 60  03/27/21 62   Wt Readings from Last 3 Encounters:  06/13/21 180 lb (81.6 kg)  04/25/21 184 lb 9.6 oz (83.7 kg)  03/29/21 180 lb (81.6 kg)    Assessment/Interventions: Review of patient past medical history, allergies, medications, health status, including review of consultants reports, laboratory and other test data, was performed as part of comprehensive evaluation and provision of chronic care management services.   SDOH:  (Social Determinants of Health) assessments and  interventions performed: Yes SDOH Interventions    Flowsheet Row Most Recent Value  SDOH Interventions   Financial Strain Interventions Intervention Not Indicated       CCM Care Plan  Allergies  Allergen Reactions   Codeine Nausea And Vomiting   Lisinopril Cough   Sulfonamide Derivatives Nausea And Vomiting   Tetracyclines & Related Rash    Medications  Reviewed Today     Reviewed by Criselda Peaches, LPN (Licensed Practical Nurse) on 06/13/21 at 1315  Med List Status: <None>   Medication Order Taking? Sig Documenting Provider Last Dose Status Informant  albuterol (VENTOLIN HFA) 108 (90 Base) MCG/ACT inhaler [id number] No INHALE TWO PUFFS BY MOUTH INTO LUNGS every SIX hours AS NEEDED FOR WHEEZING AND/OR SHORTNESS OF Trellis Paganini, Alinda Sierras, MD Taking Active   aspirin EC 81 MG tablet [id number] No Take 1 tablet (81 mg total) by mouth daily. Swallow whole. Burnell Blanks, MD Taking Active Self  atorvastatin (LIPITOR) 40 MG tablet [id number]  TAKE ONE TABLET BY MOUTH EVERY EVENING Needs appointment for further refills Burnell Blanks, MD  Active   Blood Pressure Monitoring DEVI [id number] No Use as needed DX I10 [name], MD Taking Active   clopidogrel (PLAVIX) 75 MG tablet [id number]  Take 1 tablet (75 mg total) by mouth daily. Burnell Blanks, MD  Active   fluticasone [facility]) 50 MCG/ACT nasal spray [id number]  USE TWO SPRAYS in each nostril daily Burchette, Alinda Sierras, MD  Active   furosemide (LASIX) 20 MG tablet [id number] No TAKE ONE TABLET BY MOUTH daily AS NEEDED FOR LEG EDEMA Burchette, Alinda Sierras, MD Taking Active   guaiFENesin (MUCINEX) 600 MG 12 hr tablet [id number] No Take 600 mg by mouth 2 (two) times daily as needed. [provider] Taking Active   losartan (COZAAR) 50 MG tablet [id number] No TAKE ONE TABLET BY MOUTH EVERY MORNING Burchette, Alinda Sierras, MD Taking Active   Menthol, Topical Analgesic, (BIOFREEZE EX) [id number] No Apply 1  application topically daily as needed (back pain). [provider] Taking Active Self  methimazole (TAPAZOLE) 10 MG tablet [id number]  TAKE TWO TABLETS BY MOUTH EVERY Myrene Buddy, MD  Active   metoprolol succinate (TOPROL-XL) 50 MG 24 hr tablet [id number] No TAKE ONE TABLET BY MOUTH EVERY MORNING Laurie Shin, MD Taking Active   mupirocin ointment (BACTROBAN) 2 % [id number] No Apply 1 application topically 2 (two) times daily. [name], MD Taking Active   nitroGLYCERIN (NITROSTAT) 0.4 MG SL tablet [id number] No Place 1 tablet (0.4 mg total) under the tongue every 5 (five) minutes as needed for chest pain. Furth, Cadence H, PA-C Taking Expired 01/17/20 2359   ondansetron (ZOFRAN ODT) 8 MG disintegrating tablet [id number] No Take 1 tablet (8 mg total) by mouth every 8 (eight) hours as needed for nausea or vomiting. [name], MD Taking Active   oxyCODONE-acetaminophen (PERCOCET) 5-325 MG tablet [id number] No Take 1-2 tablets by mouth every 4 (four) hours as needed for moderate pain. Delora Fuel, MD Taking Active   pantoprazole (PROTONIX) 40 MG tablet [id number] No TAKE ONE TABLET BY MOUTH EVERY MORNING [name], MD Taking Active   venlafaxine XR (EFFEXOR-XR) 150 MG 24 hr capsule [id number]  TAKE ONE TABLET BY MOUTH EVERY MORNING Burchette, Alinda Sierras, MD  Active             Patient Active Problem List   Diagnosis Date Noted   Fracture, lumbar vertebra, compression ([place]) 03/29/2021   Eye swelling 04/27/2020   Hyperthyroidism 02/19/2020   Coronary artery disease involving native coronary artery of native heart with unstable angina pectoris ([place])    Substance abuse ([place]) 07/23/2019   Renal insufficiency 09/10/2018   Elevated troponin 09/10/2018   Leukocytosis 09/10/2018   Hematemesis 09/09/2018   Opiate withdrawal ([place]) 09/09/2018   Vitamin D deficiency 06/10/2018   Shortness of breath  11/05/2017   Elevated brain natriuretic peptide (BNP) level  11/05/2017   Polysubstance (excluding opioids) dependence, daily use ([place]) 11/05/2017   AKI (acute kidney injury) ([place]) 11/05/2017   Low serum vitamin D 08/23/2015   Hyperlipidemia 11/08/2014   Reflux esophagitis 03/23/2014   Dysphagia, pharyngoesophageal phase 03/23/2014   Pain in the chest 03/23/2014   Special screening for malignant neoplasms, colon 07/09/2013   Chest pain 04/23/2012   Dyspnea 04/23/2012   Murmur 04/23/2012   INTERSTITIAL CYSTITIS 01/01/2010   SWELLING, NECK 01/01/2010   CAD, NATIVE VESSEL 12/19/2009   Palpitations 11/21/2009   ABNORMAL CV (STRESS) TEST 11/21/2009   Essential hypertension 10/31/2009   ELECTROCARDIOGRAM, ABNORMAL 10/25/2009   PLANTAR FASCIITIS, BILATERAL 10/12/2009   CERVICAL RADICULOPATHY 07/04/2008   Pain in soft tissues of limb 03/25/2008   DEGENERATIVE DISC DISEASE, CERVICAL SPINE, W/RADICULOPATHY 09/08/2007   LOW BACK PAIN 09/08/2007   TOBACCO USE 03/23/2007   OSTEOARTHROSIS, LOCAL NOS, LOWER LEG 11/07/2006   STATE, SYMPTOMATIC MENOPAUSE/FEM CLIMACTERIC 10/27/2006   Depression, recurrent ([place]) 09/15/2006   Thoracic or lumbosacral neuritis or radiculitis, unspecified 09/15/2006   IBS 09/09/2006   STRICTURE, ESOPHAGEAL 03/16/2001    Immunization History  Administered Date(s) Administered   Influenza Split 12/16/2011   Influenza Whole 11/15/2008, 10/25/2009   Influenza,inj,Quad PF,6+ Mos 11/18/2012, 11/29/2013, 11/08/2014, 03/14/2017, 12/20/2020   Janssen (J&J) SARS-COV-2 Vaccination 06/12/2019   Td 10/25/2009   Patient is in the process of switching PCPs here in the office but has not scheduled with her new provider. She is also overdue for a physical and colonoscopy and bone density scan. Provided her with telephone number to schedule colonoscopy.  Patient's son overdosed and they had to use Narcan to resussitate him. [place]. He has a girlfriend and she takes care of him and she went down  to the beach when he was in the hospital. Life has been stressful for her lately.  Conditions to be addressed/monitored:  Hypertension, Hyperlipidemia, Coronary Artery Disease, Depression, Tobacco use, Allergic Rhinitis and chronic pain  Conditions addressed this visit: Hypertension, pain, depression  Care Plan : CCM Pharmacy Care Plan  Updates made by Viona Gilmore, [place] since 09/21/2021 12:00 AM     Problem: Problem: Hypertension, Hyperlipidemia, Coronary Artery Disease, Depression, Tobacco use, Allergic Rhinitis and chronic pain      Long-Range Goal: Patient-Specific Goal   Start Date: 04/10/2020  Expected End Date: 04/10/2021  Recent Progress: On track  Priority: High  Note:   Current Barriers:  Unable to independently monitor therapeutic efficacy Unable to achieve control of blood pressure  Unable to self administer medications as prescribed   Pharmacist Clinical Goal(s):  Patient will achieve control of blood pressure as evidenced by home blood pressure readings adhere to plan to optimize therapeutic regimen for blood thinners as evidenced by report of adherence to recommended medication management changes through collaboration with PharmD and provider.   Interventions: 1:1 collaboration with [name], MD regarding development and update of comprehensive plan of care as evidenced by provider attestation and co-signature Inter-disciplinary care team collaboration (see longitudinal plan of care) Comprehensive medication review performed; medication list updated in electronic medical record  Hypertension (BP goal <130/80) -Uncontrolled -Current treatment: Metoprolol succinate 50 mg 1 tablet daily - Appropriate, Query effective, Safe, Accessible Losartan 50 mg 1 tablet daily - Appropriate, Query effective, Safe, Accessible -Medications previously tried: none  -Current home readings: could not provide as patient does not have a  BP cuff -Current dietary habits:  did not discuss -Current exercise habits: did not discuss -Denies hypotensive/hypertensive symptoms -Educated on Importance of home blood pressure monitoring; Proper BP monitoring technique; -Counseled to monitor BP at home twice weekly, document, and provide log at future appointments -Recommended to continue current medication Counseled on obtaining a blood pressure cuff from insurance or the pharmacy  Hyperlipidemia: (LDL goal < 70) -Uncontrolled -Current treatment: Atorvastatin 40 mg 1 tablet daily - Appropriate, Query effective, Safe, Accessible -Medications previously tried: none  -Current dietary patterns: did not discuss -Current exercise habits: did not discuss -Educated on Cholesterol goals;  Importance of limiting foods high in cholesterol; -Recommended to continue current medication  Depression (Goal: minimize symptoms) -Controlled -Current treatment: Venlafaxine XR 150 mg 1 capsule in the morning with breakfast - Appropriate, Effective, Safe, Accessible -Medications previously tried/failed:  Xanax, Elavil, Wellbutrin, Citalopram, Pristiq, Valium, Cymbalta  -PHQ9: 19 (11/24/19) -Educated on Benefits of medication for symptom control Benefits of cognitive-behavioral therapy with or without medication -Recommended to continue current medication Provided behavioral health phone number [phone]) to set up an appointment.  Coronary artery disease (Goal: prevent heart events) -Controlled -Current treatment  Aspirin 81 mg daily - Appropriate, Effective, Safe, Accessible Clopidogrel 75 mg daily - Appropriate, Effective, Safe, Accessible -Medications previously tried: Brilinta (shortness of breath)  -Recommended to continue current medication  Tobacco use (Goal quit smoking) -Uncontrolled -Previous quit attempts: Wellbutrin -Current treatment  No medications -Patient smokes After 30 minutes of waking -Patient triggers include: stress -On a scale of 1-10, reports  MOTIVATION to quit is 8 -On a scale of 1-10, reports CONFIDENCE in quitting is 5 - Plan to reassess willingness to quit at follow up  -Recommended to continue  GERD (Goal: minimize symptoms of heartburn or acid reflux) -Uncontrolled -Current treatment  Pantoprazole 40 mg daily -Medications previously tried: none  -Recommended to continue current medication Counseled on non-pharmacologic management of symptoms such as elevating the head of your bed, avoiding eating 2-3 hours before bed, avoiding triggering foods such as acidic, spicy, or fatty foods, eating smaller meals, and wearing clothes that are loose around the waist  Allergic rhinitis (Goal: minimize symptoms of allergies) -Controlled -Current treatment  Mucinex 600 mg twice daily as needed Flonase 50 mcg/act 2 spray daily as needed -Medications previously tried: none -Recommended to continue current medication  Hyperthryroidism (Goal: TSH 0.35-4.5) -Controlled -Current treatment  Methimazole 10 mg 2 tablets by mouth daily -Medications previously tried: none  -Recommended to continue current medication  Lower extremity swelling (Goal: minimize fluid retention) -Controlled -Current treatment  Furosemide 20 mg 1 tablet daily as needed - Appropriate, Effective, Safe, Accessible -Medications previously tried: none  -Recommended to continue current medication Recommended weighing herself to assess for fluid changes.  Health Maintenance -Vaccine gaps: shingles, tetanus, COVID booster, Prevnar 20 -Current therapy:  BioFreeze Ex daily as needed Nitroglycerin 0.4 mg as needed -Educated on Cost vs benefit of each product must be carefully weighed by individual consumer -Patient is satisfied with current therapy and denies issues -Recommended to continue current medication  Patient Goals/Self-Care Activities Patient will:  - take medications as prescribed focus on medication adherence by setting an alarm to remember to take  medications on time check blood pressure twice weekly, document, and provide at future appointments  Follow Up Plan: Telephone follow up appointment with care management team member scheduled for: 3 months      Medication Assistance: None required.  Patient affirms current coverage meets needs.  Compliance/Adherence/Medication fill history:  Care Gaps: Shingrix, tetanus, DEXA, mammogram, colonoscopy, COVID booster, influenza, Prevnar20 Last BP - 142/80 on 04/25/2021 Last A1C - 5.4 on 09/13/2018  Star-Rating Drugs: Atorvastatin 40 mg - last filled 08/13/2021 30 DS at [facility] Losartan  50 mg - last filled 08/13/2021 30 DS at [facility]  Patient's preferred pharmacy is:  [facility] - [place], [place] - [address] [place] [address] Phone: [phone] Fax: [phone]  [place] [id number], [place] [place] [address] Phone: [phone] Fax: [phone]   Uses pill box? No - adherence packaging Pt endorses 80% compliance - has not been taking clopidogrel x 2 weeks  We discussed: Benefits of medication synchronization, packaging and delivery as well as enhanced pharmacist oversight with [facility]. Patient decided to: Utilize [facility] pharmacy for medication synchronization, packaging and delivery  Care Plan and Follow Up Patient Decision:  Patient agrees to Care Plan and Follow-up.  Plan: Telephone follow up appointment with care management team member scheduled for:   3 months  Jeni Salles, PharmD [place] Pharmacist [place] at [place] [phone]

## 2021-09-23 NOTE — Progress Notes (Unsigned)
Cardiology Office Note:    Date:  09/25/2021   ID:  Laurie [name], DOB 1955/08/12, MRN [id number]  PCP:  [name], MD   [facility] Providers Cardiologist:  Lauree Chandler, MD     Referring MD: [name], MD   Chief Complaint: follow-up CAD   History of Present Illness:    Laurie [name] is a pleasant 66 y.o. female with a hx of CAD s/p PCI, hypertension, rheumatoid arthritis, COPD/asthma, GERD, tobacco abuse, and hyperthyroidism.   She established care with Dr. Angelena Form August 2021 with concerns for chest pain and dyspnea. Echo in 2019 with LVEF 65%, mild MR. Cardiac cath 2011 with mild CAD. Chronically abnormal EKG. Cardiac cath 10/07/19 with severe mid circumflex/OM stenosis treated with DES x 2, mild disease in prox LAD and moderate disease in mRCA. LV systolic function normal. Had dyspnea on Brilinta, now on aspirin and Plavix.   Her last office visit was 02/28/20 via telemedicine with Dr. Angelena Form at which time she was taking Tapazole for recent diagnosis of Graves disease. No changes were made to treatment plan and she was advised to follow-up in 6 months.   Today, she is here alone for follow-up. Reports having more fatigue, increased SOB, and bilateral lower extremity edema. Recently added more pillows to make breathing easier and thinks that she sits up on occasion during the night to breath better. Occasional sharp chest pains, not frequent. Difficult to describe how they feel, occur at random occasions both at rest and with activity. Does not exercise consistently but does her own shopping. Has pain in the tops of both of her legs which limits her walking.  Recent fall with injury to left wrist, asks if I think it is broken.  Has also fallen backwards and hurt her back.  When questioned about the symptoms that lead to falls, she states her legs hurt but she also occasionally gets dizzy.  Does not monitor BP at home. Constantly feels cold.   Past  Medical History:  Diagnosis Date   Allergy    Anxiety    Arthritis    RA   Asthma    Chronic back pain    COPD (chronic obstructive pulmonary disease) (HCC)    Depression    Esophageal stricture    GERD (gastroesophageal reflux disease)    Heart murmur    Hematemesis 09/10/2018   Hyperlipidemia    Hypertension    IBS (irritable bowel syndrome)    Interstitial cystitis    Neuromuscular disorder (HCC)    fibromyalgia   PONV (postoperative nausea and vomiting)     Past Surgical History:  Procedure Laterality Date   ABDOMINAL HYSTERECTOMY     APPENDECTOMY     BIOPSY  09/11/2018   Procedure: BIOPSY;  Surgeon: Irving Copas., MD;  Location: [place];  Service: Gastroenterology;;   CERVICAL FUSION     CERVICAL LAMINECTOMY     CORONARY STENT INTERVENTION N/A 10/07/2019   Procedure: CORONARY STENT INTERVENTION;  Surgeon: Burnell Blanks, MD;  Location: [place] [facility];  Service: Cardiovascular;  Laterality: N/A;   ESOPHAGOGASTRODUODENOSCOPY     ESOPHAGOGASTRODUODENOSCOPY (EGD) WITH PROPOFOL N/A 09/11/2018   Procedure: ESOPHAGOGASTRODUODENOSCOPY (EGD) WITH PROPOFOL;  Surgeon: Rush Landmark Telford Nab., MD;  Location: [place];  Service: Gastroenterology;  Laterality: N/A;   FINGER SURGERY     KYPHOPLASTY N/A 03/29/2021   Procedure: Lumbar Three KYPHOPLASTY;  Surgeon: Kristeen Miss, MD;  Location: [place];  Service: Neurosurgery;  Laterality: N/A;  LEFT HEART CATH AND CORONARY ANGIOGRAPHY N/A 10/07/2019   Procedure: LEFT HEART CATH AND CORONARY ANGIOGRAPHY;  Surgeon: Burnell Blanks, MD;  Location: [facility];  Service: Cardiovascular;  Laterality: N/A;   LUMBAR FUSION     SAVORY DILATION N/A 09/11/2018   Procedure: SAVORY DILATION;  Surgeon: Rush Landmark Telford Nab., MD;  Location: [place];  Service: Gastroenterology;  Laterality: N/A;   TONSILLECTOMY      Current Medications: Current Meds  Medication Sig   albuterol (VENTOLIN HFA) 108  (90 Base) MCG/ACT inhaler INHALE TWO PUFFS BY MOUTH INTO LUNGS every SIX hours AS NEEDED FOR WHEEZING AND/OR SHORTNESS OF BREATH   aspirin EC 81 MG tablet Take 1 tablet (81 mg total) by mouth daily. Swallow whole.   atorvastatin (LIPITOR) 40 MG tablet TAKE ONE TABLET BY MOUTH EVERY EVENING Needs appointment for further refills   Blood Pressure Monitoring DEVI Use as needed DX I10   clopidogrel (PLAVIX) 75 MG tablet Take 1 tablet (75 mg total) by mouth daily.   fluticasone (FLONASE) 50 MCG/ACT nasal spray USE TWO SPRAYS in each nostril daily   furosemide (LASIX) 20 MG tablet TAKE ONE TABLET BY MOUTH daily AS NEEDED FOR LEG EDEMA   losartan (COZAAR) 50 MG tablet TAKE ONE TABLET BY MOUTH EVERY MORNING   methimazole (TAPAZOLE) 10 MG tablet TAKE TWO TABLETS BY MOUTH EVERY EVENING   metoprolol succinate (TOPROL-XL) 50 MG 24 hr tablet Take 50 mg by mouth daily. Take with or immediately following a meal.   ondansetron (ZOFRAN ODT) 8 MG disintegrating tablet Take 1 tablet (8 mg total) by mouth every 8 (eight) hours as needed for nausea or vomiting.   oxyCODONE-acetaminophen (PERCOCET) 5-325 MG tablet Take 1-2 tablets by mouth every 4 (four) hours as needed for moderate pain.   pantoprazole (PROTONIX) 40 MG tablet TAKE ONE TABLET BY MOUTH EVERY MORNING   venlafaxine XR (EFFEXOR-XR) 150 MG 24 hr capsule TAKE ONE TABLET BY MOUTH EVERY MORNING   [DISCONTINUED] metoprolol succinate (TOPROL-XL) 50 MG 24 hr tablet TAKE ONE TABLET BY MOUTH EVERY MORNING     Allergies:   Codeine, Lisinopril, Sulfonamide derivatives, and Tetracyclines & related   Social History   Socioeconomic History   Marital status: Single    Spouse name: Not on file   Number of children: Not on file   Years of education: Not on file   Highest education level: Not on file  Occupational History   Occupation: disability  Tobacco Use   Smoking status: Every Day    Packs/day: 0.50    Types: Cigarettes   Smokeless tobacco: Never    Tobacco comments:    trying to quit   Vaping Use   Vaping Use: Never used  Substance and Sexual Activity   Alcohol use: Yes    Comment: occasional   Drug use: Yes    Types: Marijuana   Sexual activity: Not on file  Other Topics Concern   Not on file  Social History Narrative   Not on file   Social Determinants of Health   Financial Resource Strain: Low Risk  (09/21/2021)   Overall Financial Resource Strain (CARDIA)    Difficulty of Paying Living Expenses: Not very hard  Food Insecurity: No Food Insecurity (06/13/2021)   Hunger Vital Sign    Worried About Running Out of Food in the Last Year: Never true    Ran Out of Food in the Last Year: Never true  Transportation Needs: No Transportation Needs (06/13/2021)  PRAPARE - Hydrologist (Medical): No    Lack of Transportation (Non-Medical): No  Physical Activity: Insufficiently Active (06/13/2021)   Exercise Vital Sign    Days of Exercise per Week: 3 days    Minutes of Exercise per Session: 10 min  Stress: No Stress Concern Present (06/13/2021)   [place]    Feeling of Stress : Only a little  Social Connections: Moderately Integrated (06/13/2021)   Social Connection and Isolation Panel [NHANES]    Frequency of Communication with Friends and Family: More than three times a week    Frequency of Social Gatherings with Friends and Family: More than three times a week    Attends Religious Services: More than 4 times per year    Active Member of [employer] or Organizations: Yes    Attends Music therapist: More than 4 times per year    Marital Status: Divorced     Family History: The patient's family history includes Aneurysm in her brother; Cancer in her brother; Coronary artery disease in her father, paternal aunt, and paternal grandmother; Dementia in her mother; Esophageal cancer in her brother; Heart attack in her father; Stomach  cancer in her paternal aunt. There is no history of Rectal cancer, Colon cancer, or Thyroid disease.  ROS:   Please see the history of present illness.    + cold intolerance + DOE + sharp chest pain + claudication + fatigue All other systems reviewed and are negative.  Labs/Other Studies Reviewed:    The following studies were reviewed today:  LHC 09/2019 Mid RCA lesion is 50% stenosed. 2nd Mrg lesion is 80% stenosed. Mid Cx lesion is 90% stenosed. Prox LAD lesion is 40% stenosed. A drug-eluting stent was successfully placed using a [place] [id number]. Post intervention, there is a 0% residual stenosis. A drug-eluting stent was successfully placed using a [place] [id number]. Post intervention, there is a 0% residual stenosis. The left ventricular systolic function is normal. LV end diastolic pressure is normal. There is no mitral valve regurgitation. The left ventricular ejection fraction is 55-65% by visual estimate.   1. Mild non-obstructive disease in the proximal to mid LAD 2. Severe stenosis mid Circumflex into OM1 3. Successful PTCA/DES x 2 Mid Circumflex into OM1 4. Moderate non-obstructive disease in the mid RCA 5. Normal LV systolic function.    Recommendations: Continue DAPT with ASA and Brilinta for at least 6 months post stenting.  Continue statin. Consider beta blocker therapy at follow up. Same day discharge post PCI if stable.   VAS LE (DVT) 09/2018  Right: There is no evidence of deep vein thrombosis in the lower  extremity.  Left: No evidence of common femoral vein obstruction.     Echo 10/2017  Left ventricle: The cavity size was normal. There was mild    concentric hypertrophy. Systolic function was vigorous. The    estimated ejection fraction was in the range of 65% to 70%. Wall    motion was normal; there were no regional wall motion    abnormalities. Left ventricular diastolic function parameters    were normal.  - Aortic  valve: Valve area (VTI): 2.38 cm^2. Valve area (Vmax):    2.41 cm^2. Valve area (Vmean): 2.45 cm^2.  - Aortic root: The aortic root was normal in size.  - Mitral valve: Structurally normal valve. There was mild    regurgitation.  - Left atrium: The atrium  was at the upper limits of normal in    size.  - Right ventricle: The cavity size was normal. Wall thickness was    normal. Systolic function was normal.  - Right atrium: The atrium was normal in size.  - Tricuspid valve: There was mild regurgitation.  - Pulmonary arteries: Systolic pressure was within the normal    range.  - Inferior vena cava: The vessel was normal in size.  - Pericardium, extracardiac: There was no pericardial effusion.    Recent Labs: 02/09/2021: ALT 14; Pro B Natriuretic peptide (BNP) 51.0 03/29/2021: BUN 17; Creatinine, Ser 0.91; Hemoglobin 12.0; Platelets 226; Potassium 4.4; Sodium 137 04/25/2021: TSH 2.94  Recent Lipid Panel    Component Value Date/Time   CHOL 159 09/03/2019 1353   TRIG 227 (H) 09/03/2019 1353   HDL 46 (L) 09/03/2019 1353   CHOLHDL 3.5 09/03/2019 1353   VLDL 36.0 07/23/2019 1441   LDLCALC 81 09/03/2019 1353   LDLDIRECT 123.0 03/14/2017 0948     Risk Assessment/Calculations:      Physical Exam:    VS:  BP 132/64   Pulse 67   Ht '5\' 10"'$  (1.778 m)   Wt 175 lb 3.2 oz (79.5 kg)   SpO2 97%   BMI 25.14 kg/m     Wt Readings from Last 3 Encounters:  09/25/21 175 lb 3.2 oz (79.5 kg)  06/13/21 180 lb (81.6 kg)  04/25/21 184 lb 9.6 oz (83.7 kg)     GEN:  Well nourished, well developed in no acute distress HEENT: Normal NECK: No JVD; No carotid bruits CARDIAC: RRR, no murmurs, rubs, gallops RESPIRATORY:  Clear to auscultation without rales, wheezing or rhonchi  ABDOMEN: Soft, non-tender, non-distended MUSCULOSKELETAL:  Bilateral LE edema. No deformity. 2+ pedal pulses, equal bilaterally SKIN: Warm and dry NEUROLOGIC:  Alert and oriented x 3 PSYCHIATRIC:  Normal affect   EKG:   EKG is not ordered today.  Diagnoses:    1. Coronary artery disease involving native coronary artery of native heart without angina pectoris   2. Hyperlipidemia LDL goal <70   3. Dyspnea, unspecified type   4. Claudication ([place])   5. Tobacco abuse   6. Essential hypertension   7. Bilateral leg edema    Assessment and Plan:     CAD without angina: Mild non-obstructive CAD in proximal to mid LAD, severe stenosis mid circumflex into OM1 with successful PTCA/DES x 2 mid circumflex to OM1, moderate non-obstructive disease in mRCA on cath 09/2019. Occasional sharp chest pains that do not sound like angina. No indication for ischemia evaluation at this time. Advised her to notify us if chest pain worsens. Will evaluate for wall motion abnormality on echo. Continue Plavix, aspirin, atorvastatin, metoprolol, losartan.   Hypertension: BP is well controlled.  She does not monitor at home.  No medication changes today.  Dyspnea: Normal LV systolic function on cath 09/2019. Recent worsening dyspnea accompanied by occasional episodes of PND, orthopnea, and bilateral lower extremity edema. Does not appear significantly volume overloaded on exam. Does not monitor weight consistently.  We will get an echocardiogram to evaluate for structural heart disease and ventricular function.   Hyperlipidemia LDL goal < 70: LDL 113 on 08/2019.  We will recheck when she returns for testing.  Continue atorvastatin.  Tobacco abuse: Smoking 1 ppd. Is not motivated to quit at this time. Complete cessation advised.   Bilateral leg edema: Mild bilateral nonpitting edema. States this is new.  As noted above, we will evaluate echocardiogram  for structural heart disease as well as heart function.  Advised her to increase Lasix to 2 tablets daily for 3 days and then 20 mg daily.  We will get basic metabolic panel today to evaluate for electrolyte abnormalities and kidney function. Emphasized leg elevation and low sodium diet.    Claudication: Bilateral thigh pain that limits her activity. With history of CAD and tobacco abuse, need to rule out arterial disease. Will get lower extremity arterial duplex.      Disposition: 2 months with APP  Medication Adjustments/Labs and Tests Ordered: Current medicines are reviewed at length with the patient today.  Concerns regarding medicines are outlined above.  Orders Placed This Encounter  Procedures   Basic Metabolic Panel (BMET)   ECHOCARDIOGRAM COMPLETE   VAS [place] ABI WITH/WO TBI   VAS [place] LOWER EXTREMITY ARTERIAL DUPLEX   No orders of the defined types were placed in this encounter.   Patient Instructions  Medication Instructions:   INCREASE Lasix two (2) tablets by mouth (40 mg) X 3 days than go back to one (1) tablet by mouth (20 mg ) daily.   *If you need a refill on your cardiac medications before your next appointment, please call your pharmacy*   Lab Work:  TODAY!!!! BMET  If you have labs (blood work) drawn today and your tests are completely normal, you will receive your results only by: [place] (if you have MyChart) OR A paper copy in the mail If you have any lab test that is abnormal or we need to change your treatment, we will call you to review the results.   Testing/Procedures:  Your physician has requested that you have an echocardiogram. Echocardiography is a painless test that uses sound waves to create images of your heart. It provides your doctor with information about the size and shape of your heart and how well your heart's chambers and valves are working. This procedure takes approximately one hour. There are no restrictions for this procedure.  Your physician has requested that you have a lower extremity arterial exercise duplex. During this test, exercise and ultrasound are used to evaluate arterial blood flow in the legs. Allow one hour for this exam. There are no restrictions or special instructions. Your physician has  requested that you have an ankle brachial index (ABI). During this test an ultrasound and blood pressure cuff are used to evaluate the arteries that supply the arms and legs with blood. Allow thirty minutes for this exam. There are no restrictions or special instructions.    Follow-Up: At [facility], you and your health needs are our priority.  As part of our continuing mission to provide you with exceptional heart care, we have created designated Provider Care Teams.  These Care Teams include your primary Cardiologist (physician) and Advanced Practice Providers (APPs -  Physician Assistants and Nurse Practitioners) who all work together to provide you with the care you need, when you need it.  We recommend signing up for the patient portal called "MyChart".  Sign up information is provided on this After Visit Summary.  MyChart is used to connect with patients for Virtual Visits (Telemedicine).  Patients are able to view lab/test results, encounter notes, upcoming appointments, etc.  Non-urgent messages can be sent to your provider as well.   To learn more about what you can do with MyChart, go to [email or web address].    Your next appointment:   2 month(s)  The format for your next appointment:   In  Person  Provider:   [name], NP         Other Instructions   For your  leg edema you  should do  the following 1. Leg elevation - I recommend the Lounge Dr. Leg rest.  See below for details  2. Salt restriction  -  Use potassium chloride instead of regular salt as a salt substitute. 3. Walk regularly 4. Compression hose - Medical Supply store  5. Weight loss    Available on [email or web address] Or  Go to [email or web address]      Important Information About Sugar         Signed, Emmaline Life, NP  09/25/2021 5:13 PM    [facility]

## 2021-09-25 ENCOUNTER — Ambulatory Visit (INDEPENDENT_AMBULATORY_CARE_PROVIDER_SITE_OTHER): Payer: Medicare Other | Admitting: Nurse Practitioner

## 2021-09-25 ENCOUNTER — Encounter: Payer: Medicare Other | Admitting: Adult Health

## 2021-09-25 ENCOUNTER — Encounter: Payer: Self-pay | Admitting: Nurse Practitioner

## 2021-09-25 VITALS — BP 132/64 | HR 67 | Ht 70.0 in | Wt 175.2 lb

## 2021-09-25 DIAGNOSIS — R600 Localized edema: Secondary | ICD-10-CM

## 2021-09-25 DIAGNOSIS — I2510 Atherosclerotic heart disease of native coronary artery without angina pectoris: Secondary | ICD-10-CM

## 2021-09-25 DIAGNOSIS — E785 Hyperlipidemia, unspecified: Secondary | ICD-10-CM | POA: Diagnosis not present

## 2021-09-25 DIAGNOSIS — I739 Peripheral vascular disease, unspecified: Secondary | ICD-10-CM

## 2021-09-25 DIAGNOSIS — R0600 Dyspnea, unspecified: Secondary | ICD-10-CM

## 2021-09-25 DIAGNOSIS — Z720 Tobacco use: Secondary | ICD-10-CM | POA: Diagnosis not present

## 2021-09-25 DIAGNOSIS — I10 Essential (primary) hypertension: Secondary | ICD-10-CM

## 2021-09-25 NOTE — Patient Instructions (Signed)
Medication Instructions:   INCREASE Lasix two (2) tablets by mouth (40 mg) X 3 days than go back to one (1) tablet by mouth (20 mg ) daily.   *If you need a refill on your cardiac medications before your next appointment, please call your pharmacy*   Lab Work:  TODAY!!!! BMET  If you have labs (blood work) drawn today and your tests are completely normal, you will receive your results only by: [place] (if you have MyChart) OR A paper copy in the mail If you have any lab test that is abnormal or we need to change your treatment, we will call you to review the results.   Testing/Procedures:  Your physician has requested that you have an echocardiogram. Echocardiography is a painless test that uses sound waves to create images of your heart. It provides your doctor with information about the size and shape of your heart and how well your heart's chambers and valves are working. This procedure takes approximately one hour. There are no restrictions for this procedure.  Your physician has requested that you have a lower extremity arterial exercise duplex. During this test, exercise and ultrasound are used to evaluate arterial blood flow in the legs. Allow one hour for this exam. There are no restrictions or special instructions. Your physician has requested that you have an ankle brachial index (ABI). During this test an ultrasound and blood pressure cuff are used to evaluate the arteries that supply the arms and legs with blood. Allow thirty minutes for this exam. There are no restrictions or special instructions.    Follow-Up: At [facility], you and your health needs are our priority.  As part of our continuing mission to provide you with exceptional heart care, we have created designated Provider Care Teams.  These Care Teams include your primary Cardiologist (physician) and Advanced Practice Providers (APPs -  Physician Assistants and Nurse Practitioners) who all work together  to provide you with the care you need, when you need it.  We recommend signing up for the patient portal called "MyChart".  Sign up information is provided on this After Visit Summary.  MyChart is used to connect with patients for Virtual Visits (Telemedicine).  Patients are able to view lab/test results, encounter notes, upcoming appointments, etc.  Non-urgent messages can be sent to your provider as well.   To learn more about what you can do with MyChart, go to [email or web address].    Your next appointment:   2 month(s)  The format for your next appointment:   In Person  Provider:   [name], NP         Other Instructions   For your  leg edema you  should do  the following 1. Leg elevation - I recommend the Lounge Dr. Leg rest.  See below for details  2. Salt restriction  -  Use potassium chloride instead of regular salt as a salt substitute. 3. Walk regularly 4. Compression hose - Medical Supply store  5. Weight loss    Available on [email or web address] Or  Go to [email or web address]      Important Information About Sugar

## 2021-09-26 ENCOUNTER — Other Ambulatory Visit: Payer: Self-pay | Admitting: *Deleted

## 2021-09-26 DIAGNOSIS — I2510 Atherosclerotic heart disease of native coronary artery without angina pectoris: Secondary | ICD-10-CM

## 2021-09-26 DIAGNOSIS — E785 Hyperlipidemia, unspecified: Secondary | ICD-10-CM

## 2021-09-26 LAB — BASIC METABOLIC PANEL
BUN/Creatinine Ratio: 20 (ref 12–28)
BUN: 16 mg/dL (ref 8–27)
CO2: 25 mmol/L (ref 20–29)
Calcium: 9.5 mg/dL (ref 8.7–10.3)
Chloride: 101 mmol/L (ref 96–106)
Creatinine, Ser: 0.82 mg/dL (ref 0.57–1.00)
Glucose: 107 mg/dL — ABNORMAL HIGH (ref 70–99)
Potassium: 4.5 mmol/L (ref 3.5–5.2)
Sodium: 140 mmol/L (ref 134–144)
eGFR: 79 mL/min/{1.73_m2} (ref >59)

## 2021-09-26 NOTE — Progress Notes (Signed)
Pt has been made aware of normal result and verbalized understanding.  jw

## 2021-09-27 ENCOUNTER — Ambulatory Visit (INDEPENDENT_AMBULATORY_CARE_PROVIDER_SITE_OTHER): Payer: Medicare Other

## 2021-09-27 ENCOUNTER — Ambulatory Visit (INDEPENDENT_AMBULATORY_CARE_PROVIDER_SITE_OTHER): Payer: Medicare Other | Admitting: Adult Health

## 2021-09-27 ENCOUNTER — Encounter: Payer: Self-pay | Admitting: Adult Health

## 2021-09-27 VITALS — BP 150/80 | HR 79 | Temp 98.4°F | Ht 69.0 in | Wt 169.0 lb

## 2021-09-27 DIAGNOSIS — M25532 Pain in left wrist: Secondary | ICD-10-CM

## 2021-09-27 DIAGNOSIS — M545 Low back pain, unspecified: Secondary | ICD-10-CM

## 2021-09-27 DIAGNOSIS — I25110 Atherosclerotic heart disease of native coronary artery with unstable angina pectoris: Secondary | ICD-10-CM | POA: Diagnosis not present

## 2021-09-27 DIAGNOSIS — M542 Cervicalgia: Secondary | ICD-10-CM

## 2021-09-27 DIAGNOSIS — Z9889 Other specified postprocedural states: Secondary | ICD-10-CM

## 2021-09-27 DIAGNOSIS — I10 Essential (primary) hypertension: Secondary | ICD-10-CM | POA: Diagnosis not present

## 2021-09-27 DIAGNOSIS — S6992XA Unspecified injury of left wrist, hand and finger(s), initial encounter: Secondary | ICD-10-CM | POA: Diagnosis not present

## 2021-09-27 DIAGNOSIS — G8928 Other chronic postprocedural pain: Secondary | ICD-10-CM

## 2021-09-27 DIAGNOSIS — E0590 Thyrotoxicosis, unspecified without thyrotoxic crisis or storm: Secondary | ICD-10-CM

## 2021-09-27 DIAGNOSIS — Z1211 Encounter for screening for malignant neoplasm of colon: Secondary | ICD-10-CM

## 2021-09-27 DIAGNOSIS — R2681 Unsteadiness on feet: Secondary | ICD-10-CM | POA: Diagnosis not present

## 2021-09-27 DIAGNOSIS — Z7689 Persons encountering health services in other specified circumstances: Secondary | ICD-10-CM

## 2021-09-27 DIAGNOSIS — R600 Localized edema: Secondary | ICD-10-CM | POA: Diagnosis not present

## 2021-09-27 NOTE — Patient Instructions (Addendum)
It was great seeing you today   Please follow up for your physical   We will get your xray today   Call [facility] and schedule your colonoscopy   Address: [address], [place], [place] [address] Phone: [phone]

## 2021-09-27 NOTE — Progress Notes (Signed)
Patient presents to clinic today to establish care. She is a pleasant 66 year old female who  has a past medical history of Allergy, Anxiety, Arthritis, Asthma, Chronic back pain, COPD (chronic obstructive pulmonary disease) ([place]), Depression, Esophageal stricture, GERD (gastroesophageal reflux disease), Heart murmur, Hematemesis (09/10/2018), Hyperlipidemia, Hypertension, IBS (irritable bowel syndrome), Interstitial cystitis, Neuromuscular disorder ([place]), and PONV (postoperative nausea and vomiting).  Transferring from Dr. Elease Hashimoto as I see her boyfriend and other family members.   Acute Concerns: Establish Care  Chronic Issues:  Hypertension -managed with metoprolol 50 mg daily and losartan 50 mg daily.  She does not monitor her blood pressure at home BP Readings from Last 3 Encounters:  09/27/21 (!) 150/80  09/25/21 132/64  04/25/21 (!) 142/80   Hyperlipidemia -managed with atorvastatin 80 mg daily.  She denies myalgia or fatigue Lab Results  Component Value Date   CHOL 159 09/03/2019   HDL 46 (L) 09/03/2019   LDLCALC 81 09/03/2019   LDLDIRECT 123.0 03/14/2017   TRIG 227 (H) 09/03/2019   CHOLHDL 3.5 09/03/2019   Depression -managed with Effexor 150 mg daily.  In the past she has tried and failed Xanax, Elavil, Wellbutrin, citalopram, Pristiq, Valium, and Cymbalta.  CAD -mild nonobstructive CAD in proximal to mid LAD, severe stenosis mid circumflex into OM1 with successful PTCA/DES x2 mid circumflex, to OM1, moderate nonobstructive disease on cath in August 2021.  She was seen by cardiology yesterday complaining of worsening dyspnea accompanied by occasional episodes of PND, orthopnea, bilateral lower extremity edema, and occasional sharp chest pain . cardiology is ordering an echocardiogram to evaluate for structural heart disease and ventricular function.Currently controlled with Plavix 75 mg daily and aspirin 81 mg daily  Claudication -when she was seen at cardiology  yesterday she was also complaining of bilateral thigh pain that limited her activity.  Due to history of CAD and tobacco use we will be ruling out arterial disease with lower extremity arterial duplex.  Tobacco Use - Smokes about a pack a day   GERD -takes Protonix 40 mg daily  Hyperthyroidism ( Graves Disease) -managed by endocrinology, currently prescribed Methimazole 10 mg- 2 tabs daily.  Lab Results  Component Value Date   TSH 2.94 04/25/2021   Chronic Back Pain and frequent falls. -she reports that this has been an ongoing issue for her.  She had lower lumbar surgery "it may have been 3 months ago but I cannot really remember exactly when it was, they put cement in my back".  Prior to that she was not having falls but over the last few months she has been having more frequent falls.  Most recently 2 days ago she fell stating "my legs just gave out" she fell onto her lower back and in the process injured her left wrist.  She reports difficulty with gripping, swelling, and pain with movement to her left wrist.  She also reports worsening low back pain since this most recent fall.  Health Maintenance: Dental -- Does not do routine  Vision -- Does not do routine care Immunizations -- Colonoscopy -- Overdue ( referral placed) Mammogram -- Overdue ( ordered) Bone Density --  overdue ( ordered)    Past Medical History:  Diagnosis Date   Allergy    Anxiety    Arthritis    RA   Asthma    Chronic back pain    COPD (chronic obstructive pulmonary disease) (HCC)    Depression    Esophageal stricture    GERD (  gastroesophageal reflux disease)    Heart murmur    Hematemesis 09/10/2018   Hyperlipidemia    Hypertension    IBS (irritable bowel syndrome)    Interstitial cystitis    Neuromuscular disorder (HCC)    fibromyalgia   PONV (postoperative nausea and vomiting)     Past Surgical History:  Procedure Laterality Date   ABDOMINAL HYSTERECTOMY     APPENDECTOMY     BIOPSY   09/11/2018   Procedure: BIOPSY;  Surgeon: Rush Landmark Telford Nab., MD;  Location: [facility];  Service: Gastroenterology;;   CERVICAL FUSION     CERVICAL LAMINECTOMY     CORONARY STENT INTERVENTION N/A 10/07/2019   Procedure: CORONARY STENT INTERVENTION;  Surgeon: Burnell Blanks, MD;  Location: [facility];  Service: Cardiovascular;  Laterality: N/A;   ESOPHAGOGASTRODUODENOSCOPY     ESOPHAGOGASTRODUODENOSCOPY (EGD) WITH PROPOFOL N/A 09/11/2018   Procedure: ESOPHAGOGASTRODUODENOSCOPY (EGD) WITH PROPOFOL;  Surgeon: Rush Landmark Telford Nab., MD;  Location: [facility];  Service: Gastroenterology;  Laterality: N/A;   FINGER SURGERY     KYPHOPLASTY N/A 03/29/2021   Procedure: Lumbar Three KYPHOPLASTY;  Surgeon: Kristeen Miss, MD;  Location: [facility];  Service: Neurosurgery;  Laterality: N/A;   LEFT HEART CATH AND CORONARY ANGIOGRAPHY N/A 10/07/2019   Procedure: LEFT HEART CATH AND CORONARY ANGIOGRAPHY;  Surgeon: Burnell Blanks, MD;  Location: [facility];  Service: Cardiovascular;  Laterality: N/A;   LUMBAR FUSION     SAVORY DILATION N/A 09/11/2018   Procedure: SAVORY DILATION;  Surgeon: Rush Landmark Telford Nab., MD;  Location: [facility];  Service: Gastroenterology;  Laterality: N/A;   TONSILLECTOMY      Current Outpatient Medications on File Prior to Visit  Medication Sig Dispense Refill   albuterol (VENTOLIN HFA) 108 (90 Base) MCG/ACT inhaler INHALE TWO PUFFS BY MOUTH INTO LUNGS every SIX hours AS NEEDED FOR WHEEZING AND/OR SHORTNESS OF BREATH 8.5 g 3   aspirin EC 81 MG tablet Take 1 tablet (81 mg total) by mouth daily. Swallow whole. 90 tablet 3   atorvastatin (LIPITOR) 40 MG tablet TAKE ONE TABLET BY MOUTH EVERY EVENING Needs appointment for further refills 15 tablet 0   Blood Pressure Monitoring DEVI Use as needed DX I10 1 each 1   clopidogrel (PLAVIX) 75 MG tablet Take 1 tablet (75 mg total) by mouth daily. 30 tablet 0   fluticasone (FLONASE) 50 MCG/ACT nasal  spray USE TWO SPRAYS in each nostril daily 16 g 6   furosemide (LASIX) 20 MG tablet TAKE ONE TABLET BY MOUTH daily AS NEEDED FOR LEG EDEMA 30 tablet 3   losartan (COZAAR) 50 MG tablet TAKE ONE TABLET BY MOUTH EVERY MORNING 90 tablet 3   methimazole (TAPAZOLE) 10 MG tablet TAKE TWO TABLETS BY MOUTH EVERY EVENING 180 tablet 1   metoprolol succinate (TOPROL-XL) 50 MG 24 hr tablet Take 50 mg by mouth daily. Take with or immediately following a meal.     ondansetron (ZOFRAN ODT) 8 MG disintegrating tablet Take 1 tablet (8 mg total) by mouth every 8 (eight) hours as needed for nausea or vomiting. 15 tablet 0   oxyCODONE-acetaminophen (PERCOCET) 5-325 MG tablet Take 1-2 tablets by mouth every 4 (four) hours as needed for moderate pain. 20 tablet 0   pantoprazole (PROTONIX) 40 MG tablet TAKE ONE TABLET BY MOUTH EVERY MORNING 90 tablet 0   venlafaxine XR (EFFEXOR-XR) 150 MG 24 hr capsule TAKE ONE TABLET BY MOUTH EVERY MORNING 30 capsule 6   nitroGLYCERIN (NITROSTAT) 0.4 MG SL  tablet Place 1 tablet (0.4 mg total) under the tongue every 5 (five) minutes as needed for chest pain. 25 tablet 1   No current facility-administered medications on file prior to visit.    Allergies  Allergen Reactions   Codeine Nausea And Vomiting   Lisinopril Cough   Sulfonamide Derivatives Nausea And Vomiting   Tetracyclines & Related Rash    Family History  Problem Relation Age of Onset   Dementia Mother    Heart attack Father    Coronary artery disease Father    Cancer Brother        esophageal   Esophageal cancer Brother    Coronary artery disease Paternal Aunt    Stomach cancer Paternal Aunt    Coronary artery disease Paternal Grandmother    Aneurysm Brother        aortic   Rectal cancer Neg Hx    Colon cancer Neg Hx    Thyroid disease Neg Hx     Social History   Socioeconomic History   Marital status: Single    Spouse name: Not on file   Number of children: Not on file   Years of education: Not on  file   Highest education level: Not on file  Occupational History   Occupation: disability  Tobacco Use   Smoking status: Every Day    Packs/day: 0.50    Types: Cigarettes   Smokeless tobacco: Never   Tobacco comments:    trying to quit   Vaping Use   Vaping Use: Never used  Substance and Sexual Activity   Alcohol use: Yes    Comment: occasional   Drug use: Yes    Types: Marijuana   Sexual activity: Not on file  Other Topics Concern   Not on file  Social History Narrative   Not on file   Social Determinants of Health   Financial Resource Strain: Low Risk  (09/21/2021)   Overall Financial Resource Strain (CARDIA)    Difficulty of Paying Living Expenses: Not very hard  Food Insecurity: No Food Insecurity (06/13/2021)   Hunger Vital Sign    Worried About Running Out of Food in the Last Year: Never true    Ran Out of Food in the Last Year: Never true  Transportation Needs: No Transportation Needs (06/13/2021)   PRAPARE - Hydrologist (Medical): No    Lack of Transportation (Non-Medical): No  Physical Activity: Insufficiently Active (06/13/2021)   Exercise Vital Sign    Days of Exercise per Week: 3 days    Minutes of Exercise per Session: 10 min  Stress: No Stress Concern Present (06/13/2021)   [place]    Feeling of Stress : Only a little  Social Connections: Moderately Integrated (06/13/2021)   Social Connection and Isolation Panel [NHANES]    Frequency of Communication with Friends and Family: More than three times a week    Frequency of Social Gatherings with Friends and Family: More than three times a week    Attends Religious Services: More than 4 times per year    Active Member of [employer] or Organizations: Yes    Attends Music therapist: More than 4 times per year    Marital Status: Divorced  Intimate Partner Violence: Not At Risk (06/13/2021)   Humiliation, Afraid,  Rape, and Kick questionnaire    Fear of Current or Ex-Partner: No    Emotionally Abused: No    Physically Abused:  No    Sexually Abused: No    Review of Systems  Constitutional: Negative.   HENT: Negative.    Cardiovascular:  Positive for chest pain, orthopnea, claudication, leg swelling and PND.  Gastrointestinal: Negative.   Genitourinary: Negative.   Musculoskeletal:  Positive for back pain, falls, joint pain and neck pain (chronic).  Skin: Negative.   Neurological:  Positive for weakness.  Psychiatric/Behavioral:  Positive for depression. Negative for suicidal ideas. The patient does not have insomnia.   All other systems reviewed and are negative.   BP (!) 150/80   Pulse 79   Temp 98.4 F (36.9 C) (Oral)   Ht '5\' 9"'$  (1.753 m)   Wt 169 lb (76.7 kg)   SpO2 95%   BMI 24.96 kg/m   Physical Exam Vitals and nursing note reviewed.  Constitutional:      Appearance: Normal appearance.     Comments: Smells of cigarette smoke    Cardiovascular:     Rate and Rhythm: Normal rate and regular rhythm.     Pulses: Normal pulses.     Heart sounds: Normal heart sounds.  Pulmonary:     Effort: Pulmonary effort is normal.     Breath sounds: Normal breath sounds.  Abdominal:     General: Abdomen is flat.     Palpations: Abdomen is soft.  Musculoskeletal:     Left wrist: Swelling (mild) and bony tenderness present. No deformity, snuff box tenderness or crepitus. Decreased range of motion. Normal pulse.     Right lower leg: Edema (trace non pitting) present.     Left lower leg: No edema (trace non pitting).     Comments: 3/5 grip strength on left wrist   Skin:    General: Skin is warm and dry.  Neurological:     General: No focal deficit present.     Mental Status: She is alert and oriented to person, place, and time.  Psychiatric:        Mood and Affect: Mood normal.        Behavior: Behavior normal.        Thought Content: Thought content normal.        Judgment: Judgment  normal.     Assessment/Plan: 1. Encounter to establish care - Follow up for CPE  - Stop smoking  - Encouraged to call for mammogram/bone density screen and colonoscopy   2. Acute pain of left wrist  - DG Wrist Complete Left; Future  3. Bilateral low back pain, unspecified chronicity, unspecified whether sciatica present  - DG Lumbar Spine Complete; Future  4. Essential hypertension - Elevated in the office today but WNL during other checks. No change in therapy   5. Coronary artery disease involving native coronary artery of native heart with unstable angina pectoris [facility]) - Per cardiology   6. Colon cancer screening - Phone number given to [facility] to call and schedule   7. Bilateral leg edema - Continue with lasix as directed by cardiology   8. Chronic neck pain with history of cervical spinal surgery - Per neurosurgery   9. Hyperthyroidism - Per endocrinology   10. Gait instability - Related to claudication vs chronic back pain? Consider referral to PT or Neurology   [name], NP

## 2021-09-28 ENCOUNTER — Telehealth: Payer: Self-pay | Admitting: Adult Health

## 2021-09-28 NOTE — Telephone Encounter (Signed)
Pt was just seen on 09/27/21. Pt called to ask if someone could call her back to go over her results.  [phone]

## 2021-09-29 ENCOUNTER — Telehealth: Payer: Self-pay | Admitting: Family Medicine

## 2021-09-29 NOTE — Telephone Encounter (Signed)
On call note.   Triquetrum fracture (avulsion fracture).   I tried to call patient and no answer.    Left message on her machine to put in wrist immobilizer/splint over weekend.

## 2021-10-03 ENCOUNTER — Other Ambulatory Visit: Payer: Self-pay | Admitting: Family Medicine

## 2021-10-03 ENCOUNTER — Ambulatory Visit (HOSPITAL_COMMUNITY)
Admission: RE | Admit: 2021-10-03 | Discharge: 2021-10-03 | Disposition: A | Discharge status: Home / Self Care | Payer: Medicare Other | Source: Ambulatory Visit | Attending: Cardiovascular Disease | Admitting: Cardiovascular Disease

## 2021-10-03 ENCOUNTER — Other Ambulatory Visit: Payer: Self-pay

## 2021-10-03 ENCOUNTER — Other Ambulatory Visit: Payer: Self-pay | Admitting: *Deleted

## 2021-10-03 DIAGNOSIS — I739 Peripheral vascular disease, unspecified: Secondary | ICD-10-CM

## 2021-10-03 DIAGNOSIS — S62102A Fracture of unspecified carpal bone, left wrist, initial encounter for closed fracture: Secondary | ICD-10-CM

## 2021-10-03 DIAGNOSIS — R600 Localized edema: Secondary | ICD-10-CM

## 2021-10-08 ENCOUNTER — Ambulatory Visit: Payer: Medicare Other

## 2021-10-08 ENCOUNTER — Other Ambulatory Visit: Payer: Self-pay | Admitting: Cardiovascular Disease

## 2021-10-08 ENCOUNTER — Ambulatory Visit (HOSPITAL_COMMUNITY): Payer: Medicare Other | Attending: Cardiology

## 2021-10-08 DIAGNOSIS — I2510 Atherosclerotic heart disease of native coronary artery without angina pectoris: Secondary | ICD-10-CM | POA: Diagnosis not present

## 2021-10-08 DIAGNOSIS — E785 Hyperlipidemia, unspecified: Secondary | ICD-10-CM | POA: Insufficient documentation

## 2021-10-08 DIAGNOSIS — R0600 Dyspnea, unspecified: Secondary | ICD-10-CM | POA: Insufficient documentation

## 2021-10-08 DIAGNOSIS — I739 Peripheral vascular disease, unspecified: Secondary | ICD-10-CM | POA: Insufficient documentation

## 2021-10-08 LAB — ECHOCARDIOGRAM COMPLETE
Area-P 1/2: 4.74 cm2
S' Lateral: 2.8 cm

## 2021-10-09 ENCOUNTER — Ambulatory Visit (INDEPENDENT_AMBULATORY_CARE_PROVIDER_SITE_OTHER): Payer: Medicare Other | Admitting: Family Medicine

## 2021-10-09 ENCOUNTER — Telehealth: Payer: Self-pay | Admitting: Cardiovascular Disease

## 2021-10-09 ENCOUNTER — Encounter: Payer: Self-pay | Admitting: Family Medicine

## 2021-10-09 ENCOUNTER — Ambulatory Visit: Payer: Medicare Other | Admitting: Cardiovascular Disease

## 2021-10-09 ENCOUNTER — Telehealth: Payer: Self-pay | Admitting: Pharmacist

## 2021-10-09 ENCOUNTER — Other Ambulatory Visit: Payer: Self-pay | Admitting: *Deleted

## 2021-10-09 VITALS — BP 138/80 | HR 71 | Temp 97.8°F | Resp 12 | Ht 69.0 in | Wt 174.1 lb

## 2021-10-09 DIAGNOSIS — K069 Disorder of gingiva and edentulous alveolar ridge, unspecified: Secondary | ICD-10-CM | POA: Diagnosis not present

## 2021-10-09 DIAGNOSIS — K0889 Other specified disorders of teeth and supporting structures: Secondary | ICD-10-CM | POA: Diagnosis not present

## 2021-10-09 DIAGNOSIS — K056 Periodontal disease, unspecified: Secondary | ICD-10-CM

## 2021-10-09 DIAGNOSIS — E785 Hyperlipidemia, unspecified: Secondary | ICD-10-CM

## 2021-10-09 DIAGNOSIS — I2510 Atherosclerotic heart disease of native coronary artery without angina pectoris: Secondary | ICD-10-CM

## 2021-10-09 LAB — COMPREHENSIVE METABOLIC PANEL
ALT: 13 IU/L (ref 0–32)
AST: 15 IU/L (ref 0–40)
Albumin/Globulin Ratio: 1.8 (ref 1.2–2.2)
Albumin: 4.3 g/dL (ref 3.9–4.9)
Alkaline Phosphatase: 116 IU/L (ref 44–121)
BUN/Creatinine Ratio: 24 (ref 12–28)
BUN: 24 mg/dL (ref 8–27)
Bilirubin Total: <0.2 mg/dL (ref 0.0–1.2)
CO2: 27 mmol/L (ref 20–29)
Calcium: 9.2 mg/dL (ref 8.7–10.3)
Chloride: 103 mmol/L (ref 96–106)
Creatinine, Ser: 1 mg/dL (ref 0.57–1.00)
Globulin, Total: 2.4 g/dL (ref 1.5–4.5)
Glucose: 103 mg/dL — ABNORMAL HIGH (ref 70–99)
Potassium: 4.7 mmol/L (ref 3.5–5.2)
Sodium: 140 mmol/L (ref 134–144)
Total Protein: 6.7 g/dL (ref 6.0–8.5)
eGFR: 62 mL/min/{1.73_m2} (ref >59)

## 2021-10-09 LAB — LIPID PANEL
Chol/HDL Ratio: 3.5 ratio (ref 0.0–4.4)
Cholesterol, Total: 137 mg/dL (ref 100–199)
HDL: 39 mg/dL — ABNORMAL LOW (ref >39)
LDL Chol Calc (NIH): 60 mg/dL (ref 0–99)
Triglycerides: 236 mg/dL — ABNORMAL HIGH (ref 0–149)
VLDL Cholesterol Cal: 38 mg/dL (ref 5–40)

## 2021-10-09 LAB — SPECIMEN STATUS REPORT

## 2021-10-09 MED ORDER — AMOXICILLIN-POT CLAVULANATE 875-125 MG PO TABS: 1.0000 | ORAL_TABLET | Freq: Two times a day (BID) | ORAL | 0 refills | Status: AC

## 2021-10-09 MED ORDER — LIDOCAINE VISCOUS HCL 2 % MT SOLN: OROMUCOSAL | 0 refills | Status: DC

## 2021-10-09 MED ORDER — FENOFIBRATE 145 MG PO TABS: 145.0000 mg | ORAL_TABLET | Freq: Every day | ORAL | 3 refills | Status: DC

## 2021-10-09 NOTE — Chronic Care Management (AMB) (Signed)
Chronic Care Management Pharmacy Assistant   Name: Laurie [name]  MRN: [id number] DOB: Apr 14, 1955  Reason for Encounter: Medication Review / Medication Coordination Call   Recent office visits:  09/27/2021 Laurie Peng NP - Patient was seen for Encounter to establish care and additional concerns. No medication changes. Follow up for physical.   Recent consult visits:  09/25/2021 Laurie Bame NP (cardiology) - Patient was seen for Coronary artery disease involving native coronary artery of native heart without angina pectoris and additional concerns. Discontinued Guaifenesin, Menthol and Mupirocin. Follow up in 2 months.   Hospital visits:  None  Medications: Outpatient Encounter Medications as of 10/09/2021  Medication Sig   albuterol (VENTOLIN HFA) 108 (90 Base) MCG/ACT inhaler INHALE TWO PUFFS BY MOUTH INTO LUNGS every SIX hours AS NEEDED FOR WHEEZING AND/OR SHORTNESS OF BREATH   aspirin EC 81 MG tablet Take 1 tablet (81 mg total) by mouth daily. Swallow whole.   atorvastatin (LIPITOR) 40 MG tablet TAKE ONE TABLET BY MOUTH EVERY EVENING Needs appointment for further refills   Blood Pressure Monitoring DEVI Use as needed DX I10   clopidogrel (PLAVIX) 75 MG tablet Take 1 tablet (75 mg total) by mouth daily.   fluticasone (FLONASE) 50 MCG/ACT nasal spray USE TWO SPRAYS in each nostril daily   furosemide (LASIX) 20 MG tablet TAKE ONE TABLET BY MOUTH daily AS NEEDED FOR LEG EDEMA   losartan (COZAAR) 50 MG tablet TAKE ONE TABLET BY MOUTH EVERY MORNING   methimazole (TAPAZOLE) 10 MG tablet TAKE TWO TABLETS BY MOUTH EVERY EVENING   metoprolol succinate (TOPROL-XL) 50 MG 24 hr tablet Take 50 mg by mouth daily. Take with or immediately following a meal.   nitroGLYCERIN (NITROSTAT) 0.4 MG SL tablet Place 1 tablet (0.4 mg total) under the tongue every 5 (five) minutes as needed for chest pain.   ondansetron (ZOFRAN ODT) 8 MG disintegrating tablet Take 1 tablet (8 mg total) by mouth  every 8 (eight) hours as needed for nausea or vomiting.   oxyCODONE-acetaminophen (PERCOCET) 5-325 MG tablet Take 1-2 tablets by mouth every 4 (four) hours as needed for moderate pain.   pantoprazole (PROTONIX) 40 MG tablet TAKE ONE TABLET BY MOUTH EVERY MORNING   venlafaxine XR (EFFEXOR-XR) 150 MG 24 hr capsule TAKE ONE TABLET BY MOUTH EVERY MORNING   No facility-administered encounter medications on file as of 10/09/2021.   Reviewed chart for medication changes ahead of medication coordination call.  No OVs, Consults, or hospital visits since last care coordination call/Pharmacist visit. (If appropriate, list visit date, provider name)  No medication changes indicated OR if recent visit, treatment plan here.  BP Readings from Last 3 Encounters:  09/27/21 (!) 150/80  09/25/21 132/64  04/25/21 (!) 142/80    Lab Results  Component Value Date   HGBA1C 5.4 09/13/2018     Patient obtains medications through Adherence Packaging  30 Days    Last adherence delivery included:  Aspirin EC 81 mg: one tablet at dinner Furosemide 20 mg 1 tablet daily as needed Venlafaxine XR 150 mg: one capsule by mouth with breakfast Metoprolol succinate 50 MG 24 hr: one tablet at breakfast Losartan 50 mg: one tablet with breakfast Fluticasone 50 MCG/ACT nasal spray: Place 2 sprays into both nostrils daily Albuterol HFA : 2 puffs every 6 hours as needed Methimazole 10 mg: two tablets at dinner  Pantoprazole 40 mg: one tablet at breakfast Clopidogrel 75 mg: one tablet at breakfast  Atorvastatin 40 mg: one tablet at  dinner   Patient declined (meds) last month:       Patient is due for next adherence delivery on: 10/19/2021   Called patient and reviewed medications and coordinated delivery.   This delivery to include: Aspirin EC 81 mg: one tablet at dinner Furosemide 20 mg 1 tablet daily as needed Venlafaxine XR 150 mg: one capsule by mouth with breakfast Metoprolol succinate 50 MG 24 hr: one tablet  at breakfast Losartan 50 mg: one tablet with breakfast Fluticasone 50 MCG/ACT nasal spray: Place 2 sprays into both nostrils daily Albuterol HFA : 2 puffs every 6 hours as needed Methimazole 10 mg: two tablets at dinner  Pantoprazole 40 mg: one tablet at breakfast Clopidogrel 75 mg: one tablet at breakfast  Atorvastatin 40 mg: one tablet at dinner  New medication sent to [facility] for Fenofibrate 145 mg, patient has requested this to be added to this order in packaging.    Patient will need a short fill: No short fills   Coordinated acute fill: No acute fills   Patient declined the following medications: No declined meds   Confirmed delivery date of 10/24/2021(delivery date changed with patient), advised patient that pharmacy will contact them the morning of delivery.  Patient states she has plenty of medication on hand to take her to this delivery date.    Care Gaps: AWV - scheduled 06/17/2022 Last BP - 150/80 on 09/27/2021 Shingrix - never done Covid booster - overdue Flu - due Pnemonia vaccine - postponed Mammogram - postponed Dexa scan - postponed Colonoscopy - postponed Tdap - postponed  Star Rating Drugs: Atorvastatin 40 mg - last filled 09/18/2021 15 DS at [facility] Losartan  50 mg - last filled 09/18/2021 30 DS at [facility] [phone]

## 2021-10-09 NOTE — Telephone Encounter (Signed)
-  Pt called to report of and on right calf pain that started today -She report no swelling in calf area (only both ankles) , discoloration, or warmth. However, she report tenderness to touch. -Pt state if feels more like cramping.  Nurse attempted to contact pt back and left message advising to report to ER for further evaluations as office is currently closed and no DOD onsite. Pt also provided after hours number for any questions.

## 2021-10-09 NOTE — Telephone Encounter (Signed)
Patient called and said that she has cramping in her right calf and wants to know if that's something she should be concerned about.

## 2021-10-09 NOTE — Progress Notes (Deleted)
Cardiology Office Note   Date:  10/09/2021   ID:  Laurie [name], DOB 1955-09-30, MRN [id number]  PCP:  [name], NP  Cardiologist: Dr. Angelena Form.  No chief complaint on file.     History of Present Illness: Laurie [name] is a 67 y.o. female who was referred for evaluation management of peripheral arterial disease.  She has known history of coronary artery disease status post PCI, essential hypertension, rheumatoid arthritis, COPD, GERD, tobacco use and hypothyroidism.  She underwent recent noninvasive vascular studies which showed normal ABI bilaterally but slightly abnormal waveforms distally mostly biphasic.  However, by duplex, there were normal velocities throughout the lower extremities.  Past Medical History:  Diagnosis Date   Allergy    Anxiety    Arthritis    RA   Asthma    Chronic back pain    COPD (chronic obstructive pulmonary disease) (HCC)    Depression    Esophageal stricture    GERD (gastroesophageal reflux disease)    Heart murmur    Hematemesis 09/10/2018   Hyperlipidemia    Hypertension    IBS (irritable bowel syndrome)    Interstitial cystitis    Neuromuscular disorder (HCC)    fibromyalgia   PONV (postoperative nausea and vomiting)     Past Surgical History:  Procedure Laterality Date   ABDOMINAL HYSTERECTOMY     APPENDECTOMY     BIOPSY  09/11/2018   Procedure: BIOPSY;  Surgeon: Irving Copas., MD;  Location: [facility];  Service: Gastroenterology;;   CERVICAL FUSION     CERVICAL LAMINECTOMY     CORONARY STENT INTERVENTION N/A 10/07/2019   Procedure: CORONARY STENT INTERVENTION;  Surgeon: Burnell Blanks, MD;  Location: [facility];  Service: Cardiovascular;  Laterality: N/A;   ESOPHAGOGASTRODUODENOSCOPY     ESOPHAGOGASTRODUODENOSCOPY (EGD) WITH PROPOFOL N/A 09/11/2018   Procedure: ESOPHAGOGASTRODUODENOSCOPY (EGD) WITH PROPOFOL;  Surgeon: Rush Landmark Telford Nab., MD;  Location: [place];  Service:  Gastroenterology;  Laterality: N/A;   FINGER SURGERY     KYPHOPLASTY N/A 03/29/2021   Procedure: Lumbar Three KYPHOPLASTY;  Surgeon: Kristeen Miss, MD;  Location: [place];  Service: Neurosurgery;  Laterality: N/A;   LEFT HEART CATH AND CORONARY ANGIOGRAPHY N/A 10/07/2019   Procedure: LEFT HEART CATH AND CORONARY ANGIOGRAPHY;  Surgeon: Burnell Blanks, MD;  Location: [facility];  Service: Cardiovascular;  Laterality: N/A;   LUMBAR FUSION     SAVORY DILATION N/A 09/11/2018   Procedure: SAVORY DILATION;  Surgeon: Rush Landmark Telford Nab., MD;  Location: [facility];  Service: Gastroenterology;  Laterality: N/A;   TONSILLECTOMY       Current Outpatient Medications  Medication Sig Dispense Refill   albuterol (VENTOLIN HFA) 108 (90 Base) MCG/ACT inhaler INHALE TWO PUFFS BY MOUTH INTO LUNGS every SIX hours AS NEEDED FOR WHEEZING AND/OR SHORTNESS OF BREATH 8.5 g 3   aspirin EC 81 MG tablet Take 1 tablet (81 mg total) by mouth daily. Swallow whole. 90 tablet 3   atorvastatin (LIPITOR) 40 MG tablet TAKE ONE TABLET BY MOUTH EVERY EVENING Needs appointment for further refills 15 tablet 0   Blood Pressure Monitoring DEVI Use as needed DX I10 1 each 1   clopidogrel (PLAVIX) 75 MG tablet Take 1 tablet (75 mg total) by mouth daily. 30 tablet 0   fluticasone (FLONASE) 50 MCG/ACT nasal spray USE TWO SPRAYS in each nostril daily 16 g 6   furosemide (LASIX) 20 MG tablet TAKE ONE TABLET BY MOUTH daily AS  NEEDED FOR LEG EDEMA 30 tablet 3   losartan (COZAAR) 50 MG tablet TAKE ONE TABLET BY MOUTH EVERY MORNING 90 tablet 3   methimazole (TAPAZOLE) 10 MG tablet TAKE TWO TABLETS BY MOUTH EVERY EVENING 180 tablet 1   metoprolol succinate (TOPROL-XL) 50 MG 24 hr tablet Take 50 mg by mouth daily. Take with or immediately following a meal.     nitroGLYCERIN (NITROSTAT) 0.4 MG SL tablet Place 1 tablet (0.4 mg total) under the tongue every 5 (five) minutes as needed for chest pain. 25 tablet 1   ondansetron  (ZOFRAN ODT) 8 MG disintegrating tablet Take 1 tablet (8 mg total) by mouth every 8 (eight) hours as needed for nausea or vomiting. 15 tablet 0   oxyCODONE-acetaminophen (PERCOCET) 5-325 MG tablet Take 1-2 tablets by mouth every 4 (four) hours as needed for moderate pain. 20 tablet 0   pantoprazole (PROTONIX) 40 MG tablet TAKE ONE TABLET BY MOUTH EVERY MORNING 90 tablet 0   venlafaxine XR (EFFEXOR-XR) 150 MG 24 hr capsule TAKE ONE TABLET BY MOUTH EVERY MORNING 30 capsule 6   No current facility-administered medications for this visit.    Allergies:   Codeine, Lisinopril, Sulfonamide derivatives, and Tetracyclines & related    Social History:  The patient  reports that she has been smoking cigarettes. She has been smoking an average of .5 packs per day. She has never used smokeless tobacco. She reports current alcohol use. She reports current drug use. Drug: Marijuana.   Family History:  The patient's ***family history includes Aneurysm in her brother; Cancer in her brother; Coronary artery disease in her father, paternal aunt, and paternal grandmother; Dementia in her mother; Esophageal cancer in her brother; Heart attack in her father; Stomach cancer in her paternal aunt.    ROS:  Please see the history of present illness.   Otherwise, review of systems are positive for {NONE DEFAULTED:18576}.   All other systems are reviewed and negative.    PHYSICAL EXAM: VS:  There were no vitals taken for this visit. , BMI There is no height or weight on file to calculate BMI. GEN: Well nourished, well developed, in no acute distress  HEENT: normal  Neck: no JVD, carotid bruits, or masses Cardiac: ***RRR; no murmurs, rubs, or gallops,no edema  Respiratory:  clear to auscultation bilaterally, normal work of breathing GI: soft, nontender, nondistended, + BS MS: no deformity or atrophy  Skin: warm and dry, no rash Neuro:  Strength and sensation are intact Psych: euthymic mood, full affect   EKG:   EKG {ACTION; IS/IS OZD:[id number]} ordered today. The ekg ordered today demonstrates ***   Recent Labs: 02/09/2021: Pro B Natriuretic peptide (BNP) 51.0 03/29/2021: Hemoglobin 12.0; Platelets 226 04/25/2021: TSH 2.94 10/08/2021: ALT 13; BUN 24; Creatinine, Ser 1.00; Potassium 4.7; Sodium 140    Lipid Panel    Component Value Date/Time   CHOL 137 10/08/2021 0000   TRIG 236 (H) 10/08/2021 0000   HDL 39 (L) 10/08/2021 0000   CHOLHDL 3.5 10/08/2021 0000   CHOLHDL 3.5 09/03/2019 1353   VLDL 36.0 07/23/2019 1441   LDLCALC 60 10/08/2021 0000   LDLCALC 81 09/03/2019 1353   LDLDIRECT 123.0 03/14/2017 0948      Wt Readings from Last 3 Encounters:  09/27/21 169 lb (76.7 kg)  09/25/21 175 lb 3.2 oz (79.5 kg)  06/13/21 180 lb (81.6 kg)      Other studies Reviewed: Additional studies/ records that were reviewed today include: ***. Review of the above  records demonstrates: ***      No data to display            ASSESSMENT AND PLAN:  1.  Peripheral arterial disease  2.  Coronary artery disease involving native coronary arteries without angina:  3.  Essential hypertension:  4.  Hyperlipidemia:  5.  Tobacco use:    Disposition:   FU with *** in {gen number [id number]} {Days to years:10300}  Signed,  Kathlyn Sacramento, MD  10/09/2021 8:07 AM    [place]

## 2021-10-09 NOTE — Patient Instructions (Addendum)
A few things to remember from today's visit:  A few things to remember from today's visit:  Gingival and periodontal disease - Plan: amoxicillin-clavulanate (AUGMENTIN) 875-125 MG tablet  Toothache - Plan: lidocaine (XYLOCAINE) 2 % solution I am not sure if an infectious process is causing pain. I sent antibiotic prescription to cover for possible abscess. Soft diet for now. If fever ,you need to seek immediate medical attention. Arrange appt with dentist and PCP is problem is persistent.  Dental Abscess  A dental abscess is an infection around a tooth that may involve pain, swelling, and a collection of pus, as well as other symptoms. Treatment is important to help with symptoms and to prevent the infection from spreading. The general types of dental abscesses are: Pulpal abscess. This abscess may form from the inner part of the tooth (pulp). Periodontal abscess. This abscess may form from the gum. What are the causes? This condition is caused by a bacterial infection in or around the tooth. It may result from: Severe tooth decay (cavities). Trauma to the tooth, such as a broken or chipped tooth. What increases the risk? This condition is more likely to develop in males. It is also more likely to develop in people who: Have cavities. Have severe gum disease. Eat sugary snacks between meals. Use tobacco products. Have diabetes. Have a weakened disease-fighting system (immune system). Do not brush and care for their teeth regularly. What are the signs or symptoms? Mild symptoms of this condition include: Tenderness. Bad breath. Fever. A bitter taste in the mouth. Pain in and around the infected tooth. Moderate symptoms of this condition include: Swollen neck glands. Chills. Pus drainage. Swelling and redness around the infected tooth, in the mouth, or in the face. Severe pain in and around the infected tooth. Severe symptoms of this condition include: Difficulty  swallowing. Difficulty opening the mouth. Nausea. Vomiting. How is this diagnosed? This condition is diagnosed based on: Your symptoms and your medical and dental history. An examination of the infected tooth. During the exam, your dental care provider may tap on the infected tooth. You may also need to have X-rays taken of the affected area. How is this treated? This condition is treated by getting rid of the infection. This may be done with: Antibiotic medicines. These may be used in certain situations. Antibacterial mouth rinse. Incision and drainage. This procedure is done by making an incision in the abscess to drain out the pus. Removing pus is the first priority in treating an abscess. A root canal. This may be performed to save the tooth. Your dental care provider accesses the visible part of your tooth (crown) with a drill and removes any infected pulp. Then the space is filled and sealed off. Tooth extraction. The tooth is pulled out if it cannot be saved by other treatment. You may also receive treatment for pain, such as: Acetaminophen or NSAIDs. Gels that contain a numbing medicine. An injection to block the pain near your nerve. Follow these instructions at home: Medicines Take over-the-counter and prescription medicines only as told by your dental care provider. If you were prescribed an antibiotic, take it as told by your dental care provider. Do not stop taking the antibiotic even if you start to feel better. If you were prescribed a gel that contains a numbing medicine, use it exactly as told in the directions. Do not use these gels for children who are younger than 30 years of age. Use an antibacterial mouth rinse as told  by your dental care provider. General instructions  Gargle with a mixture of salt and water 3-4 times a day or as needed. To make salt water, completely dissolve -1 tsp (3-6 g) of salt in 1 cup (237 mL) of warm water. Eat a soft diet while your  abscess is healing. Drink enough fluid to keep your urine pale yellow. Do not apply heat to the outside of your mouth. Do not use any products that contain nicotine or tobacco. These products include cigarettes, chewing tobacco, and vaping devices, such as e-cigarettes. If you need help quitting, ask your dental care provider. Keep all follow-up visits. This is important. How is this prevented?  Excellent dental home care, which includes brushing your teeth every morning and night with fluoride toothpaste. Floss one time each day. Get regularly scheduled dental cleanings. Consider having a dental sealant applied on teeth that have deep grooves to prevent cavities. Drink fluoridated water regularly. This includes most tap water. Check the label on bottled water to see if it contains fluoride. Reduce or eliminate sugary drinks. Eat healthy meals and snacks. Wear a mouth guard or face shield to protect your teeth while playing sports. Contact a health care provider if: Your pain is worse and is not helped by medicine. You have swelling. You see pus around the tooth. You have a fever or chills. Get help right away if: Your symptoms suddenly get worse. You have a very bad headache. You have problems breathing or swallowing. You have trouble opening your mouth. You have swelling in your neck or around your eye. These symptoms may represent a serious problem that is an emergency. Do not wait to see if the symptoms will go away. Get medical help right away. Call your local emergency services (911 in the [place] Do not drive yourself to the hospital. Summary A dental abscess is a collection of pus in or around a tooth that results from an infection. A dental abscess may result from severe tooth decay, trauma to the tooth, or severe gum disease around a tooth. Symptoms include severe pain, swelling, redness, and drainage of pus in and around the infected tooth. The first priority in treating a  dental abscess is to drain out the pus. Treatment may also involve removing damage inside the tooth (root canal) or extracting the tooth. This information is not intended to replace advice given to you by your health care provider. Make sure you discuss any questions you have with your health care provider. Document Revised: 04/06/2020 Document Reviewed: 04/06/2020 Elsevier Patient Education  [place].

## 2021-10-09 NOTE — Progress Notes (Signed)
ACUTE VISIT Chief Complaint  Patient presents with   Oral Pain    X 2 days   HPI: Laurie [name] is a 66 y.o. female with hx of hypothyroidism,dysphagia,polysubstance abuse, cervical/lumbar DDD,depression, CAD,tobacco use,  and vit D def here today complaining of gum severe pain that started 2 days ago. Pain is localized on some lower teeth radiated to the rest of upper and lower gum, some edema and erythema. Pain is exacerbated  by palpation, eating, and even when breathing through his mouth.  Dental Pain  This is a new problem. The current episode started in the past 7 days. The problem has been gradually improving. The pain is at a severity of 10/10. The pain is severe. Associated symptoms include thermal sensitivity. Pertinent negatives include no difficulty swallowing, facial pain, fever, oral bleeding or sinus pressure. She has tried acetaminophen for the symptoms. The treatment provided no relief.  She tried Tylenol and also took a percocet her cousin gave her. She has not noted gum or oral mucosa lesions or bleeding.  She has had similar pain in the past, evaluated by dentists and recommended dental extractions. Symptoms improved with abx treatment.   Review of Systems  Constitutional:  Positive for fatigue. Negative for activity change, appetite change and fever.  HENT:  Negative for sinus pressure.   Respiratory:  Negative for cough, shortness of breath and wheezing.   Gastrointestinal:  Negative for abdominal pain, nausea and vomiting.  Skin:  Negative for rash.  Psychiatric/Behavioral:  Negative for confusion. The patient is nervous/anxious.   Rest see pertinent positives and negatives per HPI.  Current Outpatient Medications on File Prior to Visit  Medication Sig Dispense Refill   albuterol (VENTOLIN HFA) 108 (90 Base) MCG/ACT inhaler INHALE TWO PUFFS BY MOUTH INTO LUNGS every SIX hours AS NEEDED FOR WHEEZING AND/OR SHORTNESS OF BREATH 8.5 g 3   aspirin EC 81 MG  tablet Take 1 tablet (81 mg total) by mouth daily. Swallow whole. 90 tablet 3   atorvastatin (LIPITOR) 40 MG tablet TAKE ONE TABLET BY MOUTH EVERY EVENING Needs appointment for further refills 15 tablet 0   Blood Pressure Monitoring DEVI Use as needed DX I10 1 each 1   clopidogrel (PLAVIX) 75 MG tablet Take 1 tablet (75 mg total) by mouth daily. 30 tablet 0   fenofibrate (TRICOR) 145 MG tablet Take 1 tablet (145 mg total) by mouth daily. 90 tablet 3   fluticasone (FLONASE) 50 MCG/ACT nasal spray USE TWO SPRAYS in each nostril daily 16 g 6   furosemide (LASIX) 20 MG tablet TAKE ONE TABLET BY MOUTH daily AS NEEDED FOR LEG EDEMA 30 tablet 3   losartan (COZAAR) 50 MG tablet TAKE ONE TABLET BY MOUTH EVERY MORNING 90 tablet 3   methimazole (TAPAZOLE) 10 MG tablet TAKE TWO TABLETS BY MOUTH EVERY EVENING 180 tablet 1   metoprolol succinate (TOPROL-XL) 50 MG 24 hr tablet Take 50 mg by mouth daily. Take with or immediately following a meal.     ondansetron (ZOFRAN ODT) 8 MG disintegrating tablet Take 1 tablet (8 mg total) by mouth every 8 (eight) hours as needed for nausea or vomiting. 15 tablet 0   oxyCODONE-acetaminophen (PERCOCET) 5-325 MG tablet Take 1-2 tablets by mouth every 4 (four) hours as needed for moderate pain. 20 tablet 0   pantoprazole (PROTONIX) 40 MG tablet TAKE ONE TABLET BY MOUTH EVERY MORNING 90 tablet 0   venlafaxine XR (EFFEXOR-XR) 150 MG 24 hr capsule TAKE ONE TABLET BY  MOUTH EVERY MORNING 30 capsule 6   nitroGLYCERIN (NITROSTAT) 0.4 MG SL tablet Place 1 tablet (0.4 mg total) under the tongue every 5 (five) minutes as needed for chest pain. 25 tablet 1   No current facility-administered medications on file prior to visit.   Past Medical History:  Diagnosis Date   Allergy    Anxiety    Arthritis    RA   Asthma    Chronic back pain    COPD (chronic obstructive pulmonary disease) (HCC)    Depression    Esophageal stricture    GERD (gastroesophageal reflux disease)    Heart  murmur    Hematemesis 09/10/2018   Hyperlipidemia    Hypertension    IBS (irritable bowel syndrome)    Interstitial cystitis    Neuromuscular disorder (HCC)    fibromyalgia   PONV (postoperative nausea and vomiting)    Allergies  Allergen Reactions   Codeine Nausea And Vomiting   Lisinopril Cough   Sulfonamide Derivatives Nausea And Vomiting   Tetracyclines & Related Rash   Social History   Socioeconomic History   Marital status: Single    Spouse name: Not on file   Number of children: Not on file   Years of education: Not on file   Highest education level: Not on file  Occupational History   Occupation: disability  Tobacco Use   Smoking status: Every Day    Packs/day: 0.50    Types: Cigarettes   Smokeless tobacco: Never   Tobacco comments:    trying to quit   Vaping Use   Vaping Use: Never used  Substance and Sexual Activity   Alcohol use: Yes    Comment: occasional   Drug use: Yes    Types: Marijuana   Sexual activity: Not on file  Other Topics Concern   Not on file  Social History Narrative   Not on file   Social Determinants of Health   Financial Resource Strain: Low Risk  (09/21/2021)   Overall Financial Resource Strain (CARDIA)    Difficulty of Paying Living Expenses: Not very hard  Food Insecurity: No Food Insecurity (06/13/2021)   Hunger Vital Sign    Worried About Running Out of Food in the Last Year: Never true    Ran Out of Food in the Last Year: Never true  Transportation Needs: No Transportation Needs (06/13/2021)   PRAPARE - Hydrologist (Medical): No    Lack of Transportation (Non-Medical): No  Physical Activity: Insufficiently Active (06/13/2021)   Exercise Vital Sign    Days of Exercise per Week: 3 days    Minutes of Exercise per Session: 10 min  Stress: No Stress Concern Present (06/13/2021)   Laurie [name]    Feeling of Stress : Only a little  Social  Connections: Moderately Integrated (06/13/2021)   Social Connection and Isolation Panel [NHANES]    Frequency of Communication with Friends and Family: More than three times a week    Frequency of Social Gatherings with Friends and Family: More than three times a week    Attends Religious Services: More than 4 times per year    Active Member of [employer] or Organizations: Yes    Attends Archivist Meetings: More than 4 times per year    Marital Status: Divorced   Vitals:   10/09/21 1455  BP: 138/80  Pulse: 71  Resp: 12  Temp: 97.8 F (36.6 C)  SpO2:  98%   Body mass index is 25.71 kg/m.  Physical Exam Vitals and nursing note reviewed.  Constitutional:      General: She is not in acute distress.    Appearance: She is well-developed. She is not ill-appearing.  HENT:     Head: Normocephalic and atraumatic.     Nose:     Right Sinus: No maxillary sinus tenderness.     Left Sinus: No maxillary sinus tenderness.     Mouth/Throat:     Mouth: Mucous membranes are moist.     Dentition: Abnormal dentition. Dental tenderness, gingival swelling and dental caries present. No gum lesions.     Tongue: No lesions.     Palate: No lesions.     Pharynx: Uvula midline. No pharyngeal swelling or posterior oropharyngeal erythema.     Comments: Mild gum edema and erythema, some areas tender with palpation. I did not a[ppreciate fluctuant areas or active drainage. Eyes:     Conjunctiva/sclera: Conjunctivae normal.  Cardiovascular:     Rate and Rhythm: Normal rate and regular rhythm.     Heart sounds: No murmur heard. Pulmonary:     Effort: Pulmonary effort is normal. No respiratory distress.     Breath sounds: Normal breath sounds. No stridor.  Musculoskeletal:     Cervical back: No edema or erythema. No muscular tenderness.  Lymphadenopathy:     Head:     Right side of head: No submandibular adenopathy.     Left side of head: No submandibular adenopathy.     Cervical: Cervical  adenopathy (1 cm, tender.) present.     Right cervical: Superficial cervical adenopathy present.  Skin:    General: Skin is warm.     Findings: No erythema or rash.  Neurological:     Mental Status: She is alert and oriented to person, place, and time.  Psychiatric:        Mood and Affect: Mood is anxious. Affect is tearful.     Comments: Well groomed, good eye contact.   ASSESSMENT AND PLAN:  Laurie [name] was seen today for oral pain.  Diagnoses and all orders for this visit:  Gingival and periodontal disease We discussed differential diagnosis and the possible complications if not appropriately treated. Recommend starting abx treatment. He must establish with dentist. Encouraged smoking cessation. Instructed about warning signs.  -     amoxicillin-clavulanate (AUGMENTIN) 875-125 MG tablet; Take 1 tablet by mouth 2 (two) times daily for 7 days.  Toothache Treatment for possible dental abscess started today. Topical Lidocaine qid as needed. Because hx of CAD, NSAID's are not indicated. Because hx of polysubstance abuse and opioid withdrawal ,I am not comfortable with prescribing opioids.  -     lidocaine (XYLOCAINE) 2 % solution; Apply 5 ml  around gum of affected teeth up to 4 times per day.  I spent a total of 31 minutes in both face to face and non face to face activities for this visit on the date of this encounter. During this time history was obtained and documented, examination was performed,and assessment/plan discussed as described above under listed problems.  Return if symptoms worsen or fail to improve, for Teethache with PCP.  Zavion Sleight G. [place], MD  [facility]. [place] office.

## 2021-10-10 NOTE — Telephone Encounter (Signed)
Pt called per Dr. Camillia Herter recommendation to have Pt f/u with her PCP regarding her RT leg cramping.    When called - Pt stated RT calf tightening occurred yesterday late afternoon.  Pt woke up this morning and has mild ankle edema.  Pt stated she MISSED an appointment set up by Dr. Angelena Form to see a vascular surgeon yesterday due to a tooth problem.  Pt went to PCP and was given an antibiotic yesterday morning.    Pt stated she took 2 lasix pills today to treat the mild edema.  Pt advised to call the vascular surgeon office to see if they have any availability to see her today, as she had an appointment scheduled yesterday / had a tooth concern that required antibiotics.  If no add on appointments are available, Pt advised to follow up with her PCP, early this morning to be seen again today for her RT calf pain / mild edema.     Pt understood instructions, and advised against waiting until next week as she had mentioned.  Pt verbalized understanding to call the vascular surgeon and her PCP today to address her concern.

## 2021-10-11 DIAGNOSIS — E039 Hypothyroidism, unspecified: Secondary | ICD-10-CM

## 2021-10-11 DIAGNOSIS — I25110 Atherosclerotic heart disease of native coronary artery with unstable angina pectoris: Secondary | ICD-10-CM | POA: Diagnosis not present

## 2021-10-11 DIAGNOSIS — E785 Hyperlipidemia, unspecified: Secondary | ICD-10-CM

## 2021-10-11 DIAGNOSIS — F32A Depression, unspecified: Secondary | ICD-10-CM

## 2021-10-11 DIAGNOSIS — F17210 Nicotine dependence, cigarettes, uncomplicated: Secondary | ICD-10-CM | POA: Diagnosis not present

## 2021-10-11 DIAGNOSIS — I10 Essential (primary) hypertension: Secondary | ICD-10-CM

## 2021-10-12 ENCOUNTER — Ambulatory Visit (INDEPENDENT_AMBULATORY_CARE_PROVIDER_SITE_OTHER): Payer: Medicare Other | Admitting: Orthopaedic Surgery

## 2021-10-12 ENCOUNTER — Ambulatory Visit (HOSPITAL_BASED_OUTPATIENT_CLINIC_OR_DEPARTMENT_OTHER): Payer: Medicare Other | Admitting: Radiology

## 2021-10-12 ENCOUNTER — Ambulatory Visit (INDEPENDENT_AMBULATORY_CARE_PROVIDER_SITE_OTHER): Payer: Medicare Other

## 2021-10-12 DIAGNOSIS — S63502A Unspecified sprain of left wrist, initial encounter: Secondary | ICD-10-CM | POA: Diagnosis not present

## 2021-10-12 DIAGNOSIS — S63434A Traumatic rupture of volar plate of right ring finger at metacarpophalangeal and interphalangeal joint, initial encounter: Secondary | ICD-10-CM

## 2021-10-12 DIAGNOSIS — W19XXXA Unspecified fall, initial encounter: Secondary | ICD-10-CM

## 2021-10-12 DIAGNOSIS — M79641 Pain in right hand: Secondary | ICD-10-CM | POA: Diagnosis not present

## 2021-10-12 DIAGNOSIS — M79672 Pain in left foot: Secondary | ICD-10-CM

## 2021-10-12 DIAGNOSIS — M19042 Primary osteoarthritis, left hand: Secondary | ICD-10-CM | POA: Diagnosis not present

## 2021-10-12 DIAGNOSIS — M25532 Pain in left wrist: Secondary | ICD-10-CM | POA: Diagnosis not present

## 2021-10-12 DIAGNOSIS — M19041 Primary osteoarthritis, right hand: Secondary | ICD-10-CM | POA: Diagnosis not present

## 2021-10-12 DIAGNOSIS — S6292XA Unspecified fracture of left wrist and hand, initial encounter for closed fracture: Secondary | ICD-10-CM | POA: Diagnosis not present

## 2021-10-12 NOTE — Progress Notes (Signed)
Chief Complaint: Left wrist sprain left foot pain, right hand pain     History of Present Illness:    Laurie [name] is a 66 y.o. female right-hand-dominant female presents with a left wrist sprain after a fall back onto the wrist.  She is also having proximal phalangeal swelling about the right ring finger as well as tenderness over the base of the left fourth metatarsal.  She states that overall he is involving improving slowly.  The emergency room was found to have a dorsal triquetral fracture on the left.  She is here today for further assessment    Surgical History:   None   PMH/PSH/Family History/Social History/Meds/Allergies:    Past Medical History:  Diagnosis Date   Allergy    Anxiety    Arthritis    RA   Asthma    Chronic back pain    COPD (chronic obstructive pulmonary disease) (HCC)    Depression    Esophageal stricture    GERD (gastroesophageal reflux disease)    Heart murmur    Hematemesis 09/10/2018   Hyperlipidemia    Hypertension    IBS (irritable bowel syndrome)    Interstitial cystitis    Neuromuscular disorder (HCC)    fibromyalgia   PONV (postoperative nausea and vomiting)    Past Surgical History:  Procedure Laterality Date   ABDOMINAL HYSTERECTOMY     APPENDECTOMY     BIOPSY  09/11/2018   Procedure: BIOPSY;  Surgeon: Irving Copas., MD;  Location: [place];  Service: Gastroenterology;;   CERVICAL FUSION     CERVICAL LAMINECTOMY     CORONARY STENT INTERVENTION N/A 10/07/2019   Procedure: CORONARY STENT INTERVENTION;  Surgeon: Burnell Blanks, MD;  Location: [facility];  Service: Cardiovascular;  Laterality: N/A;   ESOPHAGOGASTRODUODENOSCOPY     ESOPHAGOGASTRODUODENOSCOPY (EGD) WITH PROPOFOL N/A 09/11/2018   Procedure: ESOPHAGOGASTRODUODENOSCOPY (EGD) WITH PROPOFOL;  Surgeon: Rush Landmark Telford Nab., MD;  Location: [place];  Service: Gastroenterology;  Laterality: N/A;    FINGER SURGERY     KYPHOPLASTY N/A 03/29/2021   Procedure: Lumbar Three KYPHOPLASTY;  Surgeon: Kristeen Miss, MD;  Location: [place];  Service: Neurosurgery;  Laterality: N/A;   LEFT HEART CATH AND CORONARY ANGIOGRAPHY N/A 10/07/2019   Procedure: LEFT HEART CATH AND CORONARY ANGIOGRAPHY;  Surgeon: Burnell Blanks, MD;  Location: [facility];  Service: Cardiovascular;  Laterality: N/A;   LUMBAR FUSION     SAVORY DILATION N/A 09/11/2018   Procedure: SAVORY DILATION;  Surgeon: Rush Landmark Telford Nab., MD;  Location: [facility];  Service: Gastroenterology;  Laterality: N/A;   TONSILLECTOMY     Social History   Socioeconomic History   Marital status: Single    Spouse name: Not on file   Number of children: Not on file   Years of education: Not on file   Highest education level: Not on file  Occupational History   Occupation: disability  Tobacco Use   Smoking status: Every Day    Packs/day: 0.50    Types: Cigarettes   Smokeless tobacco: Never   Tobacco comments:    trying to quit   Vaping Use   Vaping Use: Never used  Substance and Sexual Activity   Alcohol use: Yes    Comment: occasional   Drug use: Yes  Types: Marijuana   Sexual activity: Not on file  Other Topics Concern   Not on file  Social History Narrative   Not on file   Social Determinants of Health   Financial Resource Strain: Low Risk  (09/21/2021)   Overall Financial Resource Strain (CARDIA)    Difficulty of Paying Living Expenses: Not very hard  Food Insecurity: No Food Insecurity (06/13/2021)   Hunger Vital Sign    Worried About Running Out of Food in the Last Year: Never true    [place] in the Last Year: Never true  Transportation Needs: No Transportation Needs (06/13/2021)   PRAPARE - Hydrologist (Medical): No    Lack of Transportation (Non-Medical): No  Physical Activity: Insufficiently Active (06/13/2021)   Exercise Vital Sign    Days of Exercise per Week:  3 days    Minutes of Exercise per Session: 10 min  Stress: No Stress Concern Present (06/13/2021)   [place]    Feeling of Stress : Only a little  Social Connections: Moderately Integrated (06/13/2021)   Social Connection and Isolation Panel [NHANES]    Frequency of Communication with Friends and Family: More than three times a week    Frequency of Social Gatherings with Friends and Family: More than three times a week    Attends Religious Services: More than 4 times per year    Active Member of [employer] or Organizations: Yes    Attends Music therapist: More than 4 times per year    Marital Status: Divorced   Family History  Problem Relation Age of Onset   Dementia Mother    Heart attack Father    Coronary artery disease Father    Cancer Brother        esophageal   Esophageal cancer Brother    Coronary artery disease Paternal Aunt    Stomach cancer Paternal Aunt    Coronary artery disease Paternal Grandmother    Aneurysm Brother        aortic   Rectal cancer Neg Hx    Colon cancer Neg Hx    Thyroid disease Neg Hx    Allergies  Allergen Reactions   Codeine Nausea And Vomiting   Lisinopril Cough   Sulfonamide Derivatives Nausea And Vomiting   Tetracyclines & Related Rash   Current Outpatient Medications  Medication Sig Dispense Refill   albuterol (VENTOLIN HFA) 108 (90 Base) MCG/ACT inhaler INHALE TWO PUFFS BY MOUTH INTO LUNGS every SIX hours AS NEEDED FOR WHEEZING AND/OR SHORTNESS OF BREATH 8.5 g 3   amoxicillin-clavulanate (AUGMENTIN) 875-125 MG tablet Take 1 tablet by mouth 2 (two) times daily for 7 days. 14 tablet 0   aspirin EC 81 MG tablet Take 1 tablet (81 mg total) by mouth daily. Swallow whole. 90 tablet 3   atorvastatin (LIPITOR) 40 MG tablet Take 1 tablet (40 mg total) by mouth every evening. 90 tablet 3   Blood Pressure Monitoring DEVI Use as needed DX I10 1 each 1   clopidogrel (PLAVIX)  75 MG tablet Take 1 tablet (75 mg total) by mouth daily. 90 tablet 3   fenofibrate (TRICOR) 145 MG tablet Take 1 tablet (145 mg total) by mouth daily. 90 tablet 3   fluticasone (FLONASE) 50 MCG/ACT nasal spray USE TWO SPRAYS in each nostril daily 16 g 6   furosemide (LASIX) 20 MG tablet TAKE ONE TABLET BY MOUTH daily AS NEEDED FOR LEG  EDEMA 30 tablet 3   lidocaine (XYLOCAINE) 2 % solution Apply 5 ml  around gum of affected teeth up to 4 times per day. 100 mL 0   losartan (COZAAR) 50 MG tablet TAKE ONE TABLET BY MOUTH EVERY MORNING 90 tablet 3   methimazole (TAPAZOLE) 10 MG tablet TAKE TWO TABLETS BY MOUTH EVERY EVENING 180 tablet 1   metoprolol succinate (TOPROL-XL) 50 MG 24 hr tablet Take 50 mg by mouth daily. Take with or immediately following a meal.     nitroGLYCERIN (NITROSTAT) 0.4 MG SL tablet Place 1 tablet (0.4 mg total) under the tongue every 5 (five) minutes as needed for chest pain. 25 tablet 1   ondansetron (ZOFRAN ODT) 8 MG disintegrating tablet Take 1 tablet (8 mg total) by mouth every 8 (eight) hours as needed for nausea or vomiting. 15 tablet 0   oxyCODONE-acetaminophen (PERCOCET) 5-325 MG tablet Take 1-2 tablets by mouth every 4 (four) hours as needed for moderate pain. 20 tablet 0   pantoprazole (PROTONIX) 40 MG tablet TAKE ONE TABLET BY MOUTH EVERY MORNING 90 tablet 0   venlafaxine XR (EFFEXOR-XR) 150 MG 24 hr capsule TAKE ONE TABLET BY MOUTH EVERY MORNING 30 capsule 6   No current facility-administered medications for this visit.   No results found.  Review of Systems:   A ROS was performed including pertinent positives and negatives as documented in the HPI.  Physical Exam :   Constitutional: NAD and appears stated age Neurological: Alert and oriented Psych: Appropriate affect and cooperative There were no vitals taken for this visit.   Comprehensive Musculoskeletal Exam:    Tenderness over left dorsal wrist, tenderness over right proximal phalanx with limited motion  and swelling.  She is able to make a full composite fist.  Tenderness over.  The base of the left fourth metacarpal  Imaging:   Xray (3 views left wrist, 3 views right hand, 3 views left foot): Left triquetral avulsion fracture otherwise normal   I personally reviewed and interpreted the radiographs.   Assessment:   66 y.o. female with left wrist sprain equivalent as well as right fourth proximal phalanx sprain of the volar plate and a left fourth metatarsal volar plate sprain.  At this time she may be activity as tolerated.  I will plan to see her back on an as-needed basis  Plan :    -Return to clinic as needed     I personally saw and evaluated the patient, and participated in the management and treatment plan.  Vanetta Mulders, MD Attending Physician, Orthopedic Surgery  This document was dictated using Dragon voice recognition software. A reasonable attempt at proof reading has been made to minimize errors.

## 2021-10-16 ENCOUNTER — Encounter: Payer: Self-pay | Admitting: Cardiovascular Disease

## 2021-10-16 ENCOUNTER — Ambulatory Visit: Payer: Medicare Other | Attending: Cardiovascular Disease | Admitting: Cardiovascular Disease

## 2021-10-16 VITALS — BP 134/80 | HR 63 | Ht 69.0 in | Wt 173.6 lb

## 2021-10-16 DIAGNOSIS — E782 Mixed hyperlipidemia: Secondary | ICD-10-CM

## 2021-10-16 DIAGNOSIS — R0989 Other specified symptoms and signs involving the circulatory and respiratory systems: Secondary | ICD-10-CM

## 2021-10-16 DIAGNOSIS — I10 Essential (primary) hypertension: Secondary | ICD-10-CM | POA: Diagnosis not present

## 2021-10-16 DIAGNOSIS — Z720 Tobacco use: Secondary | ICD-10-CM | POA: Diagnosis not present

## 2021-10-16 DIAGNOSIS — I739 Peripheral vascular disease, unspecified: Secondary | ICD-10-CM | POA: Diagnosis not present

## 2021-10-16 NOTE — Patient Instructions (Signed)
Medication Instructions:  No changes *If you need a refill on your cardiac medications before your next appointment, please call your pharmacy*   Lab Work: None ordered If you have labs (blood work) drawn today and your tests are completely normal, you will receive your results only by: [place] (if you have MyChart) OR A paper copy in the mail If you have any lab test that is abnormal or we need to change your treatment, we will call you to review the results.   Testing/Procedures: Your physician has requested that you have a carotid duplex. This test is an ultrasound of the carotid arteries in your neck. It looks at blood flow through these arteries that supply the brain with blood. Allow one hour for this exam. There are no restrictions or special instructions. This will take place at [place], [address].  Follow-Up: At [facility], you and your health needs are our priority.  As part of our continuing mission to provide you with exceptional heart care, we have created designated Provider Care Teams.  These Care Teams include your primary Cardiologist (physician) and Advanced Practice Providers (APPs -  Physician Assistants and Nurse Practitioners) who all work together to provide you with the care you need, when you need it.  We recommend signing up for the patient portal called "MyChart".  Sign up information is provided on this After Visit Summary.  MyChart is used to connect with patients for Virtual Visits (Telemedicine).  Patients are able to view lab/test results, encounter notes, upcoming appointments, etc.  Non-urgent messages can be sent to your provider as well.   To learn more about what you can do with MyChart, go to [email or web address].    Your next appointment:   Follow up as needed with Dr. Fletcher Anon  Important Information About Sugar

## 2021-10-16 NOTE — Progress Notes (Signed)
Cardiology Office Note   Date:  10/18/2021   ID:  Laurie [name], DOB 1955/09/22, MRN [id number]  PCP:  Laurie Peng, NP  Cardiologist: Dr. Angelena [name].  No chief complaint on file.     History of Present Illness: Laurie [name] is a 66 y.o. female who was referred for evaluation management of peripheral arterial disease.  She has known history of coronary artery disease status post PCI, essential hypertension, rheumatoid arthritis, COPD, GERD, tobacco use and hypothyroidism. She had previous back surgery due to ruptured disc and 2 cervical spine surgeries.  In addition, she fell in February and had kyphoplasty done.  She reports low back pain radiating to both thighs and knees at rest and with exertion.  She has burning sensation in her legs.  No lower extremity ulceration.  She underwent recent noninvasive vascular studies which showed normal ABI bilaterally but slightly abnormal waveforms distally mostly biphasic.  However, by duplex, there were normal velocities throughout the lower extremities.  Past Medical History:  Diagnosis Date   Allergy    Anxiety    Arthritis    RA   Asthma    Chronic back pain    COPD (chronic obstructive pulmonary disease) (HCC)    Depression    Esophageal stricture    GERD (gastroesophageal reflux disease)    Heart murmur    Hematemesis 09/10/2018   Hyperlipidemia    Hypertension    IBS (irritable bowel syndrome)    Interstitial cystitis    Neuromuscular disorder (HCC)    fibromyalgia   PONV (postoperative nausea and vomiting)     Past Surgical History:  Procedure Laterality Date   ABDOMINAL HYSTERECTOMY     APPENDECTOMY     BIOPSY  09/11/2018   Procedure: BIOPSY;  Surgeon: Laurie [name]., MD;  Location: [place];  Service: Gastroenterology;;   CERVICAL FUSION     CERVICAL LAMINECTOMY     CORONARY STENT INTERVENTION N/A 10/07/2019   Procedure: CORONARY STENT INTERVENTION;  Surgeon: Laurie Blanks, MD;   Location: [place] [facility];  Service: Cardiovascular;  Laterality: N/A;   ESOPHAGOGASTRODUODENOSCOPY     ESOPHAGOGASTRODUODENOSCOPY (EGD) WITH PROPOFOL N/A 09/11/2018   Procedure: ESOPHAGOGASTRODUODENOSCOPY (EGD) WITH PROPOFOL;  Surgeon: Laurie [name]., MD;  Location: [facility];  Service: Gastroenterology;  Laterality: N/A;   FINGER SURGERY     KYPHOPLASTY N/A 03/29/2021   Procedure: Lumbar Three KYPHOPLASTY;  Surgeon: Laurie Miss, MD;  Location: [place];  Service: Neurosurgery;  Laterality: N/A;   LEFT HEART CATH AND CORONARY ANGIOGRAPHY N/A 10/07/2019   Procedure: LEFT HEART CATH AND CORONARY ANGIOGRAPHY;  Surgeon: Laurie Blanks, MD;  Location: [place];  Service: Cardiovascular;  Laterality: N/A;   LUMBAR FUSION     SAVORY DILATION N/A 09/11/2018   Procedure: SAVORY DILATION;  Surgeon: Laurie [name]., MD;  Location: [facility];  Service: Gastroenterology;  Laterality: N/A;   TONSILLECTOMY       Current Outpatient Medications  Medication Sig Dispense Refill   albuterol (VENTOLIN HFA) 108 (90 Base) MCG/ACT inhaler INHALE TWO PUFFS BY MOUTH INTO LUNGS every SIX hours AS NEEDED FOR WHEEZING AND/OR SHORTNESS OF BREATH 8.5 g 3   aspirin EC 81 MG tablet Take 1 tablet (81 mg total) by mouth daily. Swallow whole. 90 tablet 3   atorvastatin (LIPITOR) 40 MG tablet Take 1 tablet (40 mg total) by mouth every evening. 90 tablet 3   Blood Pressure Monitoring DEVI Use as needed DX I10 1  each 1   clopidogrel (PLAVIX) 75 MG tablet Take 1 tablet (75 mg total) by mouth daily. 90 tablet 3   fenofibrate (TRICOR) 145 MG tablet Take 1 tablet (145 mg total) by mouth daily. 90 tablet 3   fluticasone (FLONASE) 50 MCG/ACT nasal spray USE TWO SPRAYS in each nostril daily 16 g 6   furosemide (LASIX) 20 MG tablet TAKE ONE TABLET BY MOUTH daily AS NEEDED FOR LEG EDEMA 30 tablet 3   lidocaine (XYLOCAINE) 2 % solution Apply 5 ml  around gum of affected teeth up to 4 times per day.  100 mL 0   losartan (COZAAR) 50 MG tablet TAKE ONE TABLET BY MOUTH EVERY MORNING 90 tablet 3   methimazole (TAPAZOLE) 10 MG tablet TAKE TWO TABLETS BY MOUTH EVERY EVENING 180 tablet 1   metoprolol succinate (TOPROL-XL) 50 MG 24 hr tablet Take 50 mg by mouth daily. Take with or immediately following a meal.     nitroGLYCERIN (NITROSTAT) 0.4 MG SL tablet Place 1 tablet (0.4 mg total) under the tongue every 5 (five) minutes as needed for chest pain. 25 tablet 1   ondansetron (ZOFRAN ODT) 8 MG disintegrating tablet Take 1 tablet (8 mg total) by mouth every 8 (eight) hours as needed for nausea or vomiting. 15 tablet 0   oxyCODONE-acetaminophen (PERCOCET) 5-325 MG tablet Take 1-2 tablets by mouth every 4 (four) hours as needed for moderate pain. 20 tablet 0   pantoprazole (PROTONIX) 40 MG tablet TAKE ONE TABLET BY MOUTH EVERY MORNING 90 tablet 0   venlafaxine XR (EFFEXOR-XR) 150 MG 24 hr capsule TAKE ONE TABLET BY MOUTH EVERY MORNING 30 capsule 6   No current facility-administered medications for this visit.    Allergies:   Codeine, Lisinopril, Sulfonamide derivatives, and Tetracyclines & related    Social History:  The patient  reports that she has been smoking cigarettes. She has been smoking an average of .5 packs per day. She has never used smokeless tobacco. She reports current alcohol use. She reports current drug use. Drug: Marijuana.   Family History:  The patient's family history includes Aneurysm in her brother; Cancer in her brother; Coronary artery disease in her father, paternal aunt, and paternal grandmother; Dementia in her mother; Esophageal cancer in her brother; Heart attack in her father; Stomach cancer in her paternal aunt.    ROS:  Please see the history of present illness.   Otherwise, review of systems are positive for none.   All other systems are reviewed and negative.    PHYSICAL EXAM: VS:  BP 134/80   Pulse 63   Ht '5\' 9"'$  (1.753 m)   Wt 173 lb 9.6 oz (78.7 kg)   SpO2  98%   BMI 25.64 kg/m  , BMI Body mass index is 25.64 kg/m. GEN: Well nourished, well developed, in no acute distress  HEENT: normal  Neck: no JVD, \ or masses.  Faint right carotid bruit Cardiac: RRR; no  rubs, or gallops,no edema .  1 out of 6 systolic murmur in the aortic area Respiratory:  clear to auscultation bilaterally, normal work of breathing GI: soft, nontender, nondistended, + BS MS: no deformity or atrophy  Skin: warm and dry, no rash Neuro:  Strength and sensation are intact Psych: euthymic mood, full affect Vascular: Femoral pulse: +2 bilaterally.  Dorsalis pedis: +2 bilaterally.   EKG:  EKG is ordered today. The ekg ordered today demonstrates normal sinus rhythm with repolarization abnormalities and chronic T wave changes in V1  and V2   Recent Labs: 02/09/2021: Pro B Natriuretic peptide (BNP) 51.0 03/29/2021: Hemoglobin 12.0; Platelets 226 04/25/2021: TSH 2.94 10/08/2021: ALT 13; BUN 24; Creatinine, Ser 1.00; Potassium 4.7; Sodium 140    Lipid Panel    Component Value Date/Time   CHOL 137 10/08/2021 0000   TRIG 236 (H) 10/08/2021 0000   HDL 39 (L) 10/08/2021 0000   CHOLHDL 3.5 10/08/2021 0000   CHOLHDL 3.5 09/03/2019 1353   VLDL 36.0 07/23/2019 1441   LDLCALC 60 10/08/2021 0000   LDLCALC 81 09/03/2019 1353   LDLDIRECT 123.0 03/14/2017 0948      Wt Readings from Last 3 Encounters:  10/16/21 173 lb 9.6 oz (78.7 kg)  10/09/21 174 lb 2 oz (79 kg)  09/27/21 169 lb (76.7 kg)           No data to display            ASSESSMENT AND PLAN:  1.  Peripheral arterial disease: The patient has evidence of mild nonobstructive atherosclerosis affecting the lower extremity.  This is not responsible for her low back pain and leg pain.  Her symptoms are more consistent with lumbar spine etiology.  She has strong femoral and dorsalis pedis pulses bilaterally. No further diagnostic testing is needed for this.  2.  Coronary artery disease involving native coronary  arteries without angina: She reports stable symptoms overall.  3.  Essential hypertension: Blood pressure is controlled.  4.  Hyperlipidemia: Currently on atorvastatin 40 mg once daily.  Recommended target LDL of less than 70.  5.  Tobacco use: I discussed with her the importance of smoking cessation.  6.  Right carotid bruit: I requested carotid Doppler.    Disposition:   FU with me as needed.  Signed,  Kathlyn Sacramento, MD  10/18/2021 12:28 PM    [facility]

## 2021-10-17 ENCOUNTER — Encounter: Payer: Self-pay | Admitting: Gastroenterology

## 2021-10-17 ENCOUNTER — Ambulatory Visit
Admission: RE | Admit: 2021-10-17 | Discharge: 2021-10-17 | Disposition: A | Discharge status: Home / Self Care | Payer: Medicare Other | Source: Ambulatory Visit | Attending: Family Medicine | Admitting: Family Medicine

## 2021-10-17 DIAGNOSIS — Z1231 Encounter for screening mammogram for malignant neoplasm of breast: Secondary | ICD-10-CM | POA: Diagnosis not present

## 2021-10-18 ENCOUNTER — Telehealth: Payer: Self-pay

## 2021-10-18 ENCOUNTER — Ambulatory Visit (HOSPITAL_COMMUNITY)
Admission: RE | Admit: 2021-10-18 | Discharge: 2021-10-18 | Disposition: A | Discharge status: Home / Self Care | Payer: Medicare Other | Source: Ambulatory Visit | Attending: Cardiology | Admitting: Cardiology

## 2021-10-18 DIAGNOSIS — R0989 Other specified symptoms and signs involving the circulatory and respiratory systems: Secondary | ICD-10-CM | POA: Diagnosis not present

## 2021-10-18 NOTE — Telephone Encounter (Signed)
Patient is on Plavix and needs an OV prior to her procedure om 11/21/21 for cardiac clearance. Pt. May see a PA.

## 2021-10-19 NOTE — Telephone Encounter (Signed)
error 

## 2021-11-06 ENCOUNTER — Other Ambulatory Visit: Payer: Medicare Other

## 2021-11-08 ENCOUNTER — Telehealth: Payer: Self-pay | Admitting: Pharmacist

## 2021-11-08 ENCOUNTER — Other Ambulatory Visit: Payer: Self-pay | Admitting: Family Medicine

## 2021-11-08 ENCOUNTER — Ambulatory Visit (INDEPENDENT_AMBULATORY_CARE_PROVIDER_SITE_OTHER): Payer: Medicare Other | Admitting: *Deleted

## 2021-11-08 DIAGNOSIS — Z23 Encounter for immunization: Secondary | ICD-10-CM

## 2021-11-08 NOTE — Chronic Care Management (AMB) (Unsigned)
Chronic Care Management Pharmacy Assistant   Name: Laurie [name]  MRN: [id number] DOB: 05-03-1955  Reason for Encounter: Medication Review / Medication Coordination Call   Recent office visits:  10/09/2021 Betty Martinique MD - Patient was seen for Gingival and periodontal disease and a toothache. Started Augmentin and Lidocaine 2% solution. Follow up if symptoms worsen or fail to improve, for Teethache with PCP.  Recent consult visits:  10/16/2021 Kathlyn Sacramento MD (cardiology) - Patient was seen for PAD (peripheral artery disease) and additional concerns. No medicaiton changes. Follow up as needed with Dr. Fletcher Anon.  10/12/2021 Vanetta Mulders MD (orthopedic) - Patient was seen for Fall, initial encounter and additional concerns. No medication changes. Follow up as needed.   Hospital visits:  None  Medications: Outpatient Encounter Medications as of 11/08/2021  Medication Sig   albuterol (VENTOLIN HFA) 108 (90 Base) MCG/ACT inhaler INHALE TWO PUFFS BY MOUTH INTO LUNGS every SIX hours AS NEEDED FOR WHEEZING AND/OR SHORTNESS OF BREATH   aspirin EC 81 MG tablet Take 1 tablet (81 mg total) by mouth daily. Swallow whole.   atorvastatin (LIPITOR) 40 MG tablet Take 1 tablet (40 mg total) by mouth every evening.   Blood Pressure Monitoring DEVI Use as needed DX I10   clopidogrel (PLAVIX) 75 MG tablet Take 1 tablet (75 mg total) by mouth daily.   fenofibrate (TRICOR) 145 MG tablet Take 1 tablet (145 mg total) by mouth daily.   fluticasone (FLONASE) 50 MCG/ACT nasal spray USE TWO SPRAYS in each nostril daily   furosemide (LASIX) 20 MG tablet TAKE ONE TABLET BY MOUTH daily AS NEEDED FOR LEG EDEMA   lidocaine (XYLOCAINE) 2 % solution Apply 5 ml  around gum of affected teeth up to 4 times per day.   losartan (COZAAR) 50 MG tablet TAKE ONE TABLET BY MOUTH EVERY MORNING   methimazole (TAPAZOLE) 10 MG tablet TAKE TWO TABLETS BY MOUTH EVERY EVENING   metoprolol succinate (TOPROL-XL) 50 MG 24 hr tablet  Take 50 mg by mouth daily. Take with or immediately following a meal.   nitroGLYCERIN (NITROSTAT) 0.4 MG SL tablet Place 1 tablet (0.4 mg total) under the tongue every 5 (five) minutes as needed for chest pain.   ondansetron (ZOFRAN ODT) 8 MG disintegrating tablet Take 1 tablet (8 mg total) by mouth every 8 (eight) hours as needed for nausea or vomiting.   oxyCODONE-acetaminophen (PERCOCET) 5-325 MG tablet Take 1-2 tablets by mouth every 4 (four) hours as needed for moderate pain.   pantoprazole (PROTONIX) 40 MG tablet TAKE ONE TABLET BY MOUTH EVERY MORNING   venlafaxine XR (EFFEXOR-XR) 150 MG 24 hr capsule TAKE ONE TABLET BY MOUTH EVERY MORNING   No facility-administered encounter medications on file as of 11/08/2021.   Reviewed chart for medication changes ahead of medication coordination call.  BP Readings from Last 3 Encounters:  10/16/21 134/80  10/09/21 138/80  09/27/21 (!) 150/80    Lab Results  Component Value Date   HGBA1C 5.4 09/13/2018     Patient obtains medications through Adherence Packaging  30 Days    Last adherence delivery included:  Aspirin EC 81 mg: one tablet at dinner Furosemide 20 mg 1 tablet daily as needed Venlafaxine XR 150 mg: one capsule by mouth with breakfast Metoprolol succinate 50 MG 24 hr: one tablet at breakfast Losartan 50 mg: one tablet with breakfast Fluticasone 50 MCG/ACT nasal spray: Place 2 sprays into both nostrils daily Albuterol HFA : 2 puffs every 6 hours as needed  Methimazole 10 mg: two tablets at dinner  Pantoprazole 40 mg: one tablet at breakfast Clopidogrel 75 mg: one tablet at breakfast  Atorvastatin 40 mg: one tablet at dinner Fenofibrate 145 mg: one tablet at dinner   Patient declined (meds) last month:  Unable to reach patient     Patient is due for next adherence delivery on: 11/21/2021   Called patient and reviewed medications and coordinated delivery.   This delivery to include: Aspirin EC 81 mg: one tablet at  dinner Furosemide 20 mg 1 tablet daily as needed Venlafaxine XR 150 mg: one capsule with breakfast Metoprolol succinate 50 MG 24 hr: one tablet at breakfast Losartan 50 mg: one tablet with breakfast Fluticasone 50 MCG/ACT nasal spray: Place 2 sprays into both nostrils daily Albuterol HFA : 2 puffs every 6 hours as needed Methimazole 10 mg: two tablets at dinner  Pantoprazole 40 mg: one tablet at breakfast Clopidogrel 75 mg: one tablet at breakfast  Atorvastatin 40 mg: one tablet at dinner Fenofibrate 145 mg: one tablet at dinner   Patient will need a short fill: No short fills   Coordinated acute fill: No acute fills   Patient declined the following medications: No declined meds   Unable to confirmed delivery date of 11/21/2021, unable to reach patient after several attempts.    Care Gaps: AWV - scheduled 06/17/2022 Last BP - 134/80 on 10/16/2021 Last A1C - 5.4 on 09/13/2018 Shingrix - never done Covid booster - overdue Pneumonia vaccine - postponed Dexa scan - postponed Colonoscopy - postponed Tdap - postponed  Star Rating Drugs: Atorvastatin 40 mg - last filled 40 mg 10/19/2021 30 DS at [facility] Losartan  50 mg - last filled 50 mg 10/19/2021 30 DS at [facility] [phone]

## 2021-11-15 ENCOUNTER — Other Ambulatory Visit: Payer: Self-pay | Admitting: Family Medicine

## 2021-11-15 DIAGNOSIS — K056 Periodontal disease, unspecified: Secondary | ICD-10-CM

## 2021-11-16 ENCOUNTER — Telehealth: Payer: Self-pay | Admitting: *Deleted

## 2021-11-16 NOTE — Patient Outreach (Signed)
  Care Coordination   11/16/2021 Name: Laurie [name] MRN: [id number] DOB: [birth year]   Care Coordination Outreach Attempts:  An unsuccessful telephone outreach was attempted today to offer the patient information about available care coordination services as a benefit of their health plan.   Follow Up Plan:  Additional outreach attempts will be made to offer the patient care coordination information and services.   Encounter Outcome:  No Answer  Care Coordination Interventions Activated:  No   Care Coordination Interventions:  No, not indicated    Raina Mina, RN Care Management Coordinator [place] Office [phone]

## 2021-11-19 ENCOUNTER — Other Ambulatory Visit: Payer: Self-pay

## 2021-11-19 MED ORDER — METHIMAZOLE 10 MG PO TABS: 20.0000 mg | ORAL_TABLET | Freq: Every evening | ORAL | 0 refills | Status: DC

## 2021-11-21 ENCOUNTER — Encounter: Payer: Medicare Other | Admitting: Gastroenterology

## 2021-11-21 ENCOUNTER — Ambulatory Visit: Payer: Medicare Other | Admitting: Physician Assistant

## 2021-11-21 NOTE — Progress Notes (Deleted)
Cardiology Office Note:    Date:  11/21/2021   ID:  Laurie [name], DOB 03-Jul-1955, MRN [id number]  PCP:  [name], NP   [facility] Providers Cardiologist:  Lauree Chandler, MD     Referring MD: [name], MD   Chief Complaint: follow-up CAD   History of Present Illness:    Laurie [name] is a pleasant 66 y.o. female with a hx of CAD s/p PCI, hypertension, rheumatoid arthritis, COPD/asthma, GERD, tobacco abuse, and hyperthyroidism.   She established care with Dr. Angelena Form August 2021 with concerns for chest pain and dyspnea. Echo in 2019 with LVEF 65%, mild MR. Cardiac cath 2011 with mild CAD. Chronically abnormal EKG. Cardiac cath 10/07/19 with severe mid circumflex/OM stenosis treated with DES x 2, mild disease in prox LAD and moderate disease in mRCA. LV systolic function normal. Had dyspnea on Brilinta, now on aspirin and Plavix.   Her last office visit was 02/28/20 via telemedicine with Dr. Angelena Form at which time she was taking Tapazole for recent diagnosis of Graves disease. No changes were made to treatment plan and she was advised to follow-up in 6 months.   I saw her on 09/25/21 for follow-up. She reported more fatigue, increased SOB, and bilateral lower extremity edema. Recently added more pillows to make breathing easier and thinks that she sits up on occasion during the night to breath better. Occasional sharp chest pains, not frequent. Difficult to describe how they feel, occur at random occasions both at rest and with activity. Does not exercise consistently but does her own shopping. Has pain in the tops of both of her legs which limits her walking.  Recent fall with injury to left wrist, asks if I think it is broken.  Has also fallen backwards and hurt her back.  When questioned about the symptoms that lead to falls, she states her legs hurt but she also occasionally gets dizzy.  Does not monitor BP at home. Constantly feels cold. 2D echo revealed  normal pumping function at 60-65%, mild LVH, , normal diastolic parameters, trivial TR. Lower extremity vascular studies revealed normal ABI bilaterally but slightly abnormal waveforms distally, mostly biphasic. She had normal velocities throughout the lower extremities. She was referred to Dr. Fletcher Anon for [facility] consult and seen 10/16/21. Dr. Fletcher Anon felt that symptoms more consistent with lumbar spine etiology and no further testing needed.   Today, she is here   Past Medical History:  Diagnosis Date   Allergy    Anxiety    Arthritis    RA   Asthma    Chronic back pain    COPD (chronic obstructive pulmonary disease) (HCC)    Depression    Esophageal stricture    GERD (gastroesophageal reflux disease)    Heart murmur    Hematemesis 09/10/2018   Hyperlipidemia    Hypertension    IBS (irritable bowel syndrome)    Interstitial cystitis    Neuromuscular disorder (HCC)    fibromyalgia   PONV (postoperative nausea and vomiting)     Past Surgical History:  Procedure Laterality Date   ABDOMINAL HYSTERECTOMY     APPENDECTOMY     BIOPSY  09/11/2018   Procedure: BIOPSY;  Surgeon: Irving Copas., MD;  Location: [place];  Service: Gastroenterology;;   CERVICAL FUSION     CERVICAL LAMINECTOMY     CORONARY STENT INTERVENTION N/A 10/07/2019   Procedure: CORONARY STENT INTERVENTION;  Surgeon: Burnell Blanks, MD;  Location: [place] [facility];  Service:  Cardiovascular;  Laterality: N/A;   ESOPHAGOGASTRODUODENOSCOPY     ESOPHAGOGASTRODUODENOSCOPY (EGD) WITH PROPOFOL N/A 09/11/2018   Procedure: ESOPHAGOGASTRODUODENOSCOPY (EGD) WITH PROPOFOL;  Surgeon: Rush Landmark Telford Nab., MD;  Location: [place];  Service: Gastroenterology;  Laterality: N/A;   FINGER SURGERY     KYPHOPLASTY N/A 03/29/2021   Procedure: Lumbar Three KYPHOPLASTY;  Surgeon: Kristeen Miss, MD;  Location: [place];  Service: Neurosurgery;  Laterality: N/A;   LEFT HEART CATH AND CORONARY ANGIOGRAPHY N/A 10/07/2019    Procedure: LEFT HEART CATH AND CORONARY ANGIOGRAPHY;  Surgeon: Burnell Blanks, MD;  Location: [place] [facility];  Service: Cardiovascular;  Laterality: N/A;   LUMBAR FUSION     SAVORY DILATION N/A 09/11/2018   Procedure: SAVORY DILATION;  Surgeon: Rush Landmark Telford Nab., MD;  Location: [place];  Service: Gastroenterology;  Laterality: N/A;   TONSILLECTOMY      Current Medications: No outpatient medications have been marked as taking for the 12/04/21 encounter (Appointment) with Ann Maki, Lanice Schwab, NP.     Allergies:   Codeine, Lisinopril, Sulfonamide derivatives, and Tetracyclines & related   Social History   Socioeconomic History   Marital status: Single    Spouse name: Not on file   Number of children: Not on file   Years of education: Not on file   Highest education level: Not on file  Occupational History   Occupation: disability  Tobacco Use   Smoking status: Every Day    Packs/day: 0.50    Types: Cigarettes   Smokeless tobacco: Never   Tobacco comments:    trying to quit   Vaping Use   Vaping Use: Never used  Substance and Sexual Activity   Alcohol use: Yes    Comment: occasional   Drug use: Yes    Types: Marijuana   Sexual activity: Not on file  Other Topics Concern   Not on file  Social History Narrative   Not on file   Social Determinants of Health   Financial Resource Strain: Low Risk  (09/21/2021)   Overall Financial Resource Strain (CARDIA)    Difficulty of Paying Living Expenses: Not very hard  Food Insecurity: No Food Insecurity (06/13/2021)   Hunger Vital Sign    Worried About Running Out of Food in the Last Year: Never true    Ran Out of Food in the Last Year: Never true  Transportation Needs: No Transportation Needs (06/13/2021)   PRAPARE - Hydrologist (Medical): No    Lack of Transportation (Non-Medical): No  Physical Activity: Insufficiently Active (06/13/2021)   Exercise Vital Sign    Days of Exercise  per Week: 3 days    Minutes of Exercise per Session: 10 min  Stress: No Stress Concern Present (06/13/2021)   [place]    Feeling of Stress : Only a little  Social Connections: Moderately Integrated (06/13/2021)   Social Connection and Isolation Panel [NHANES]    Frequency of Communication with Friends and Family: More than three times a week    Frequency of Social Gatherings with Friends and Family: More than three times a week    Attends Religious Services: More than 4 times per year    Active Member of [employer] or Organizations: Yes    Attends Music therapist: More than 4 times per year    Marital Status: Divorced     Family History: The patient's family history includes Aneurysm in her brother; Cancer in her  brother; Coronary artery disease in her father, paternal aunt, and paternal grandmother; Dementia in her mother; Esophageal cancer in her brother; Heart attack in her father; Stomach cancer in her paternal aunt. There is no history of Rectal cancer, Colon cancer, or Thyroid disease.  ROS:   Please see the history of present illness.    + cold intolerance + DOE + sharp chest pain + claudication + fatigue All other systems reviewed and are negative.  Labs/Other Studies Reviewed:    The following studies were reviewed today:  Vas Carotid US 10/18/21  Right Carotid: Velocities in the right ICA are consistent with a 1-39%  stenosis.                Non-hemodynamically significant plaque <50% noted in the  CCA.   Left Carotid: Velocities in the left ICA are consistent with a 1-39%  stenosis.   Vertebrals: Bilateral vertebral arteries demonstrate antegrade flow.  Subclavians: Right subclavian artery flow was disturbed. Normal flow               hemodynamics were seen in the left subclavian artery.   *See table(s) above for measurements and observations.   Echo 10/08/21  1. Left ventricular  ejection fraction, by estimation, is 60 to 65%. The  left ventricle has normal function. The left ventricle has no regional  wall motion abnormalities. There is mild left ventricular hypertrophy.  Left ventricular diastolic parameters  were normal.   2. Right ventricular systolic function is normal. The right ventricular  size is normal. There is normal pulmonary artery systolic pressure. The  estimated right ventricular systolic pressure is 83.3 mmHg.   3. The mitral valve is normal in structure. Trivial mitral valve  regurgitation. No evidence of mitral stenosis.   4. The aortic valve is tricuspid. Aortic valve regurgitation is trivial.  No aortic stenosis is present.   5. The inferior vena cava is normal in size with greater than 50%  respiratory variability, suggesting right atrial pressure of 3 mmHg.   ABIs 10/03/21  Summary:  Right: The right toe-brachial index is normal.  Although ankle brachial indices are within normal limits (0.95-1.29),  arterial Doppler waveforms at the ankle suggest some component of arterial  occlusive disease.  Left: The left toe-brachial index is normal.  Although ankle brachial indices are within normal limits (0.95-1.29),  arterial Doppler waveforms at the ankle suggest some component of arterial  occlusive disease.    LHC 09/2019 Mid RCA lesion is 50% stenosed. 2nd Mrg lesion is 80% stenosed. Mid Cx lesion is 90% stenosed. Prox LAD lesion is 40% stenosed. A drug-eluting stent was successfully placed using a [place] [id number]. Post intervention, there is a 0% residual stenosis. A drug-eluting stent was successfully placed using a [place] [id number]. Post intervention, there is a 0% residual stenosis. The left ventricular systolic function is normal. LV end diastolic pressure is normal. There is no mitral valve regurgitation. The left ventricular ejection fraction is 55-65% by visual estimate.   1. Mild non-obstructive  disease in the proximal to mid LAD 2. Severe stenosis mid Circumflex into OM1 3. Successful PTCA/DES x 2 Mid Circumflex into OM1 4. Moderate non-obstructive disease in the mid RCA 5. Normal LV systolic function.    Recommendations: Continue DAPT with ASA and Brilinta for at least 6 months post stenting.  Continue statin. Consider beta blocker therapy at follow up. Same day discharge post PCI if stable.   VAS LE (DVT) 09/2018  Right:  There is no evidence of deep vein thrombosis in the lower  extremity.  Left: No evidence of common femoral vein obstruction.     Echo 10/2017  Left ventricle: The cavity size was normal. There was mild    concentric hypertrophy. Systolic function was vigorous. The    estimated ejection fraction was in the range of 65% to 70%. Wall    motion was normal; there were no regional wall motion    abnormalities. Left ventricular diastolic function parameters    were normal.  - Aortic valve: Valve area (VTI): 2.38 cm^2. Valve area (Vmax):    2.41 cm^2. Valve area (Vmean): 2.45 cm^2.  - Aortic root: The aortic root was normal in size.  - Mitral valve: Structurally normal valve. There was mild    regurgitation.  - Left atrium: The atrium was at the upper limits of normal in    size.  - Right ventricle: The cavity size was normal. Wall thickness was    normal. Systolic function was normal.  - Right atrium: The atrium was normal in size.  - Tricuspid valve: There was mild regurgitation.  - Pulmonary arteries: Systolic pressure was within the normal    range.  - Inferior vena cava: The vessel was normal in size.  - Pericardium, extracardiac: There was no pericardial effusion.    Recent Labs: 02/09/2021: Pro B Natriuretic peptide (BNP) 51.0 03/29/2021: Hemoglobin 12.0; Platelets 226 04/25/2021: TSH 2.94 10/08/2021: ALT 13; BUN 24; Creatinine, Ser 1.00; Potassium 4.7; Sodium 140  Recent Lipid Panel    Component Value Date/Time   CHOL 137 10/08/2021 0000    TRIG 236 (H) 10/08/2021 0000   HDL 39 (L) 10/08/2021 0000   CHOLHDL 3.5 10/08/2021 0000   CHOLHDL 3.5 09/03/2019 1353   VLDL 36.0 07/23/2019 1441   LDLCALC 60 10/08/2021 0000   LDLCALC 81 09/03/2019 1353   LDLDIRECT 123.0 03/14/2017 0948     Risk Assessment/Calculations:      Physical Exam:    VS:  There were no vitals taken for this visit.    Wt Readings from Last 3 Encounters:  10/16/21 173 lb 9.6 oz (78.7 kg)  10/09/21 174 lb 2 oz (79 kg)  09/27/21 169 lb (76.7 kg)     GEN:  Well nourished, well developed in no acute distress HEENT: Normal NECK: No JVD; No carotid bruits CARDIAC: RRR, no murmurs, rubs, gallops RESPIRATORY:  Clear to auscultation without rales, wheezing or rhonchi  ABDOMEN: Soft, non-tender, non-distended MUSCULOSKELETAL:  Bilateral LE edema. No deformity. 2+ pedal pulses, equal bilaterally SKIN: Warm and dry NEUROLOGIC:  Alert and oriented x 3 PSYCHIATRIC:  Normal affect   EKG:  EKG is not ordered today.  Diagnoses:    No diagnosis found.  Assessment and Plan:     CAD without angina: Mild non-obstructive CAD in proximal to mid LAD, severe stenosis mid circumflex into OM1 with successful PTCA/DES x 2 mid circumflex to OM1, moderate non-obstructive disease in mRCA on cath 09/2019. Occasional sharp chest pains that do not sound like angina. No indication for ischemia evaluation at this time. Advised her to notify us if chest pain worsens. Will evaluate for wall motion abnormality on echo. Continue Plavix, aspirin, atorvastatin, metoprolol, losartan.   Hypertension: BP is well controlled.  She does not monitor at home.  No medication changes today.  Dyspnea: Normal LV systolic function on cath 09/2019. Recent worsening dyspnea accompanied by occasional episodes of PND, orthopnea, and bilateral lower extremity edema. Does not appear significantly  volume overloaded on exam. Does not monitor weight consistently.  We will get an echocardiogram to evaluate for  structural heart disease and ventricular function.   Hyperlipidemia LDL goal < 70: LDL 113 on 08/2019.  We will recheck when she returns for testing.  Continue atorvastatin.  Tobacco abuse: Smoking 1 ppd. Is not motivated to quit at this time. Complete cessation advised.   Bilateral leg edema: Mild bilateral nonpitting edema. States this is new.  As noted above, we will evaluate echocardiogram for structural heart disease as well as heart function.  Advised her to increase Lasix to 2 tablets daily for 3 days and then 20 mg daily.  We will get basic metabolic panel today to evaluate for electrolyte abnormalities and kidney function. Emphasized leg elevation and low sodium diet.   Claudication: Bilateral thigh pain that limits her activity. With history of CAD and tobacco abuse, need to rule out arterial disease. Will get lower extremity arterial duplex.      Disposition: ***  Medication Adjustments/Labs and Tests Ordered: Current medicines are reviewed at length with the patient today.  Concerns regarding medicines are outlined above.  No orders of the defined types were placed in this encounter.  No orders of the defined types were placed in this encounter.   There are no Patient Instructions on file for this visit.   Signed, Emmaline Life, NP  11/21/2021 11:13 AM    [place]

## 2021-11-22 ENCOUNTER — Other Ambulatory Visit: Payer: Self-pay | Admitting: Adult Health

## 2021-11-22 ENCOUNTER — Telehealth: Payer: Self-pay | Admitting: *Deleted

## 2021-11-22 ENCOUNTER — Encounter: Payer: Self-pay | Admitting: *Deleted

## 2021-11-22 DIAGNOSIS — E0590 Thyrotoxicosis, unspecified without thyrotoxic crisis or storm: Secondary | ICD-10-CM

## 2021-11-22 NOTE — Patient Instructions (Signed)
Visit Information  Thank you for taking time to visit with me today. Please don't hesitate to contact me if I can be of assistance to you.   Following are the goals we discussed today:   Goals Addressed               This Visit's Progress     COMPLETED: Needs a referral for Endocrinologist (pt-stated)        Care Coordination Interventions: Advised patient to follow up with her provider's office  Reviewed medications with patient and discussed adherence with all medications with no needed refills Collaborated with provider's office ([place]) regarding referral for endocrinologist Reviewed scheduled/upcoming provider appointments including pending appointments and verified AWV completed for 2023 Screening for signs and symptoms of depression related to chronic disease state  Assessed social determinant of health barriers         Please call the care guide team at [phone] if you need to cancel or reschedule your appointment.   If you are experiencing a Mental Health or [place] or need someone to talk to, please call the Suicide and Crisis Lifeline: 988  The patient verbalized understanding of instructions, educational materials, and care plan provided today and agreed to receive a mailed copy of patient instructions, educational materials, and care plan.   No further follow up required: No additional needs at this time.  Raina Mina, RN Care Management Coordinator [place] Office [phone]

## 2021-11-22 NOTE — Patient Outreach (Signed)
  Care Coordination   Initial Visit Note   11/22/2021 Name: Laurie [name] MRN: [id number] DOB: 1955/07/16  Laurie [name] is a 66 y.o. year old female who sees Nafziger, Tommi Rumps, NP for primary care. I spoke with  [name] by phone today.  What matters to the patients health and wellness today?  Needs endocrinologist    Goals Addressed               This Visit's Progress     COMPLETED: Needs a referral for Endocrinologist (pt-stated)        Care Coordination Interventions: Advised patient to follow up with her provider's office  Reviewed medications with patient and discussed adherence with all medications with no needed refills Collaborated with provider's office ([place]) regarding referral for endocrinologist Reviewed scheduled/upcoming provider appointments including pending appointments and verified AWV completed for 2023 Screening for signs and symptoms of depression related to chronic disease state  Assessed social determinant of health barriers         SDOH assessments and interventions completed:  Yes  SDOH Interventions Today    Flowsheet Row Most Recent Value  SDOH Interventions   Food Insecurity Interventions Intervention Not Indicated  Housing Interventions Intervention Not Indicated  Transportation Interventions Intervention Not Indicated  Utilities Interventions Intervention Not Indicated        Care Coordination Interventions Activated:  Yes  Care Coordination Interventions:  Yes, provided   Follow up plan: No further intervention required.   Encounter Outcome:  Pt. Visit Completed   Raina Mina, RN Care Management Coordinator [place] Office [phone]

## 2021-11-28 ENCOUNTER — Other Ambulatory Visit: Payer: Medicare Other

## 2021-12-01 ENCOUNTER — Emergency Department (HOSPITAL_BASED_OUTPATIENT_CLINIC_OR_DEPARTMENT_OTHER)
Admission: EM | Admit: 2021-12-01 | Discharge: 2021-12-01 | Disposition: A | Discharge status: Home / Self Care | Payer: Medicare Other | Attending: Emergency Medicine | Admitting: Emergency Medicine

## 2021-12-01 ENCOUNTER — Other Ambulatory Visit: Payer: Self-pay

## 2021-12-01 ENCOUNTER — Encounter (HOSPITAL_BASED_OUTPATIENT_CLINIC_OR_DEPARTMENT_OTHER): Payer: Self-pay | Admitting: Emergency Medicine

## 2021-12-01 DIAGNOSIS — Z7902 Long term (current) use of antithrombotics/antiplatelets: Secondary | ICD-10-CM | POA: Insufficient documentation

## 2021-12-01 DIAGNOSIS — Z23 Encounter for immunization: Secondary | ICD-10-CM | POA: Insufficient documentation

## 2021-12-01 DIAGNOSIS — Z7982 Long term (current) use of aspirin: Secondary | ICD-10-CM | POA: Insufficient documentation

## 2021-12-01 DIAGNOSIS — Z79899 Other long term (current) drug therapy: Secondary | ICD-10-CM | POA: Diagnosis not present

## 2021-12-01 DIAGNOSIS — S6992XA Unspecified injury of left wrist, hand and finger(s), initial encounter: Secondary | ICD-10-CM | POA: Diagnosis present

## 2021-12-01 DIAGNOSIS — W260XXA Contact with knife, initial encounter: Secondary | ICD-10-CM | POA: Diagnosis not present

## 2021-12-01 DIAGNOSIS — S61432A Puncture wound without foreign body of left hand, initial encounter: Secondary | ICD-10-CM | POA: Insufficient documentation

## 2021-12-01 MED ORDER — TETANUS-DIPHTH-ACELL PERTUSSIS 5-2.5-18.5 LF-MCG/0.5 IM SUSY
0.5000 mL | PREFILLED_SYRINGE | Freq: Once | INTRAMUSCULAR | Status: AC
Start: 2021-12-01 — End: 2021-12-01
  Administered 2021-12-01: 0.5 mL via INTRAMUSCULAR
  Filled 2021-12-01: qty 0.5

## 2021-12-01 NOTE — ED Provider Notes (Signed)
[place] EMERGENCY DEPT Provider Note   CSN: [id number] Arrival date & time: 12/01/21  1233     History  Chief Complaint  Patient presents with   Laceration    Laurie [name] is a 66 y.o. female.   Laceration Patient is a 66 year old female presented emergency room today for small laceration that occurred approximately 11 PM last night  She states that this was      Home Medications Prior to Admission medications   Medication Sig Start Date End Date Taking? Authorizing Provider  albuterol (VENTOLIN HFA) 108 (90 Base) MCG/ACT inhaler INHALE TWO PUFFS BY MOUTH INTO LUNGS every SIX hours AS NEEDED FOR WHEEZING AND/OR SHORTNESS OF BREATH 10/03/21   Nafziger, Tommi Rumps, NP  aspirin EC 81 MG tablet Take 1 tablet (81 mg total) by mouth daily. Swallow whole. 09/27/19   Burnell Blanks, MD  atorvastatin (LIPITOR) 40 MG tablet Take 1 tablet (40 mg total) by mouth every evening. 10/10/21   Burnell Blanks, MD  Blood Pressure Monitoring DEVI Use as needed DX I10 04/10/20   Burchette, Alinda Sierras, MD  clopidogrel (PLAVIX) 75 MG tablet Take 1 tablet (75 mg total) by mouth daily. 10/10/21   Burnell Blanks, MD  fenofibrate (TRICOR) 145 MG tablet Take 1 tablet (145 mg total) by mouth daily. 10/09/21 10/10/22  Swinyer, Lanice Schwab, NP  fluticasone Asencion Islam) 50 MCG/ACT nasal spray USE TWO SPRAYS in each nostril daily 05/01/21   Burchette, Alinda Sierras, MD  furosemide (LASIX) 20 MG tablet TAKE ONE TABLET BY MOUTH daily AS NEEDED FOR LEG EDEMA 09/04/21   Burchette, Alinda Sierras, MD  lidocaine (XYLOCAINE) 2 % solution Apply 5 ml  around gum of affected teeth up to 4 times per day. 10/09/21   [place], Betty G, MD  losartan (COZAAR) 50 MG tablet TAKE ONE TABLET BY MOUTH EVERY MORNING 12/05/20   Burchette, Alinda Sierras, MD  methimazole (TAPAZOLE) 10 MG tablet Take 2 tablets (20 mg total) by mouth every evening. 11/19/21   Philemon Kingdom, MD  metoprolol succinate (TOPROL-XL) 50 MG 24 hr  tablet Take 50 mg by mouth daily. Take with or immediately following a meal.    [provider]  nitroGLYCERIN (NITROSTAT) 0.4 MG SL tablet Place 1 tablet (0.4 mg total) under the tongue every 5 (five) minutes as needed for chest pain. 10/19/19 01/17/20  Furth, Cadence H, PA-C  ondansetron (ZOFRAN ODT) 8 MG disintegrating tablet Take 1 tablet (8 mg total) by mouth every 8 (eight) hours as needed for nausea or vomiting. 06/07/20   Burchette, Alinda Sierras, MD  oxyCODONE-acetaminophen (PERCOCET) 5-325 MG tablet Take 1-2 tablets by mouth every 4 (four) hours as needed for moderate pain. 1/32/44   Delora Fuel, MD  pantoprazole (PROTONIX) 40 MG tablet TAKE ONE TABLET BY MOUTH EVERY MORNING 11/09/21   Nafziger, Tommi Rumps, NP  venlafaxine XR (EFFEXOR-XR) 150 MG 24 hr capsule TAKE ONE TABLET BY MOUTH EVERY MORNING 05/01/21   Burchette, Alinda Sierras, MD      Allergies    Codeine, Lisinopril, Sulfonamide derivatives, and Tetracyclines & related    Review of Systems   Review of Systems  Physical Exam Updated Vital Signs BP (!) 142/69   Pulse 71   Temp 97.9 F (36.6 C)   Resp 17   SpO2 97%  Physical Exam Vitals and nursing note reviewed.  Constitutional:      General: She is not in acute distress.    Appearance: Normal appearance. She is not ill-appearing.  HENT:     Head: Normocephalic and atraumatic.  Eyes:     General: No scleral icterus.       Right eye: No discharge.        Left eye: No discharge.     Conjunctiva/sclera: Conjunctivae normal.  Pulmonary:     Effort: Pulmonary effort is normal.     Breath sounds: No stridor.  Skin:    Comments: Small subcentimeter flap of superficial skin lifted off of dermis.  This is on the palm of the left hand on the palmar aspect of the left index finger MCP.  Full range of motion of all fingers.  Cap refill less than 2 seconds.  I was able to control bleeding with direct pressure held firm pressure for 1 minute and released with no bleeding.  Pressure dressing  placed over  Neurological:     Mental Status: She is alert and oriented to person, place, and time. Mental status is at baseline.     ED Results / Procedures / Treatments   Labs (all labs ordered are listed, but only abnormal results are displayed) Labs Reviewed - No data to display  EKG None  Radiology No results found.  Procedures Procedures    Medications Ordered in ED Medications  Tdap (BOOSTRIX) injection 0.5 mL (0.5 mLs Intramuscular Given 12/01/21 1631)    ED Course/ Medical Decision Making/ A&P                           Medical Decision Making Risk Prescription drug management.  Patient is a 66 year old female present emergency room today with small laceration on palm  Small subcentimeter flap of superficial skin lifted off of dermis.  This is on the palm of the left hand on the palmar aspect of the left index finger MCP.  Full range of motion of all fingers.  Cap refill less than 2 seconds.  I was able to control bleeding with direct pressure held firm pressure for 1 minute and released with no bleeding.  Pressure dressing placed over  No indication for x-rays.  Full range of motion of fingers and no current bleeding after direct pressure for 1 minute.  Final Clinical Impression(s) / ED Diagnoses Final diagnoses:  Puncture wound of left hand without foreign body, initial encounter    Rx / DC Orders ED Discharge Orders     None         Tedd Sias, [place] 12/01/21 1759    Tegeler, Gwenyth Allegra, MD 12/02/21 0006

## 2021-12-01 NOTE — ED Triage Notes (Signed)
Pt  cut self on kitchen knife, unknown tetanus immunization. Laceration to left palm . Pt able to move all fingers.

## 2021-12-01 NOTE — Discharge Instructions (Addendum)
Keep the dressing on your hand for the next 2 to 4 hours.  You can check after that to make sure that there is no more bleeding.  Keep the area clean and covered with a Band-Aid.  I recommend forceful washing until tomorrow to make sure that you do not start the bleeding again.  Tylenol 1000 mg as needed for pain.  You have been updated on tetanus today.  This is good for 10 years.  After tomorrow you can keep the area clean with warm soap and water.  Dab dry and keep covered with Band-Aid.

## 2021-12-01 NOTE — ED Triage Notes (Signed)
Pt is on plavix and wound will not stop bleeding. Cleaned and dressed in triage.

## 2021-12-01 NOTE — ED Notes (Signed)
Reviewed AVS/discharge instruction with patient. Time allotted for and all questions answered. Patient is agreeable for d/c and escorted to ed exit by staff.  

## 2021-12-03 ENCOUNTER — Encounter: Payer: Self-pay | Admitting: *Deleted

## 2021-12-03 ENCOUNTER — Telehealth: Payer: Self-pay | Admitting: *Deleted

## 2021-12-03 NOTE — Patient Instructions (Signed)
Visit Information  Thank you for taking time to visit with me today. Please don't hesitate to contact me if I can be of assistance to you.   Following are the goals we discussed today:   Goals Addressed               This Visit's Progress     COMPLETED: Counseling (pt-stated)        Care Coordination Interventions: Reviewed medications with patient and discussed adherence and no needed refills Reviewed scheduled/upcoming provider appointments including pending appointments and verified pt completed her AWV for this year. Social Work referral for counseling due to caregiver stress taking care of family member with Alzheimer's. Screening for signs and symptoms of depression related to chronic disease state  Assessed social determinant of health barriers         Please call the care guide team at [phone] if you need to cancel or reschedule your appointment.   If you are experiencing a Mental Health or [place] or need someone to talk to, please call the Suicide and Crisis Lifeline: 988 call the [place] National Suicide Prevention Lifeline: (530)685-3145 or TTY: 718-246-6958 TTY 701-555-8222) to talk to a trained counselor call 1-800-273-TALK (toll free, 24 hour hotline)  The patient verbalized understanding of instructions, educational materials, and care plan provided today and agreed to receive a mailed copy of patient instructions, educational materials, and care plan.   No further follow up required: No additional needs    Raina Mina, RN Care Management Coordinator [place] Office [phone]

## 2021-12-03 NOTE — Patient Outreach (Signed)
  Care Coordination   Initial Visit Note   12/03/2021 Name: ARILYNN BLAKENEY MRN: [id number] DOB: 1955/02/26  CARMELLA KEES is a 66 y.o. year old female who sees Nafziger, Tommi Rumps, NP for primary care. I spoke with  [name] by phone today.  What matters to the patients health and wellness today?  Counseling    Goals Addressed               This Visit's Progress     COMPLETED: Counseling (pt-stated)        Care Coordination Interventions: Reviewed medications with patient and discussed adherence and no needed refills Reviewed scheduled/upcoming provider appointments including pending appointments and verified pt completed her AWV for this year. Social Work referral for counseling due to caregiver stress taking care of family member with Alzheimer's. Screening for signs and symptoms of depression related to chronic disease state  Assessed social determinant of health barriers         SDOH assessments and interventions completed:  Yes  SDOH Interventions Today    Flowsheet Row Most Recent Value  SDOH Interventions   Food Insecurity Interventions Intervention Not Indicated  Housing Interventions Intervention Not Indicated  Transportation Interventions Intervention Not Indicated  Utilities Interventions Intervention Not Indicated        Care Coordination Interventions Activated:  Yes  Care Coordination Interventions:  Yes, provided   Follow up plan: No further intervention required.   Encounter Outcome:  Pt. Visit Completed   Raina Mina, RN Care Management Coordinator [place] Office [phone]

## 2021-12-04 ENCOUNTER — Ambulatory Visit: Payer: Medicare Other | Admitting: Nurse Practitioner

## 2021-12-04 ENCOUNTER — Telehealth: Payer: Self-pay | Admitting: Licensed Clinical Social Worker

## 2021-12-04 NOTE — Patient Outreach (Signed)
  Care Coordination   12/04/2021 Name: AMEE BOOTHE MRN: [id number] DOB: 1956-01-06   Care Coordination Outreach Attempts:  An unsuccessful telephone outreach was attempted today to offer the patient information about available care coordination services as a benefit of their health plan.   Follow Up Plan:  Additional outreach attempts will be made to offer the patient care coordination information and services.   Encounter Outcome:  No Answer  Care Coordination Interventions Activated:  No   Care Coordination Interventions:  No, not indicated    [name], MSW, [email or web address] Phone [phone] 12:10 PM

## 2021-12-06 ENCOUNTER — Telehealth: Payer: Self-pay | Admitting: Licensed Clinical Social Worker

## 2021-12-09 ENCOUNTER — Other Ambulatory Visit: Payer: Self-pay | Admitting: Family Medicine

## 2021-12-09 NOTE — Progress Notes (Signed)
Cardiology Office Note:    Date:  12/11/2021   ID:  OCTOBER Laurie [name], DOB August 03, 1955, MRN [id number]  PCP:  Shirline Frees, NP   [facility] Providers Cardiologist:  Verne Carrow, MD     Referring MD: Laurie Covey, MD   Chief Complaint: follow-up CAD   History of Present Illness:    Laurie [name] is a pleasant 66 y.o. female with a hx of CAD s/p PCI, hypertension, rheumatoid arthritis, COPD/asthma, GERD, tobacco abuse, and hyperthyroidism.   She established care with Dr. Clifton James August 2021 with concerns for chest pain and dyspnea. Echo in 2019 with LVEF 65%, mild MR. Cardiac cath 2011 with mild CAD. Chronically abnormal EKG. Cardiac cath 10/07/19 with severe mid circumflex/OM stenosis treated with DES x 2, mild disease in prox LAD and moderate disease in mRCA. LV systolic function normal. Had dyspnea on Brilinta, now on aspirin and Plavix.   Her last office visit was 02/28/20 via telemedicine with Dr. Clifton James at which time she was taking Tapazole for recent diagnosis of Graves disease. No changes were made to treatment plan and she was advised to follow-up in 6 months.   I saw her on 09/25/21 for follow-up. She reported more fatigue, increased SOB, and bilateral lower extremity edema. Recently added more pillows to make breathing easier and thinks that she sits up on occasion during the night to breath better. Occasional sharp chest pains, not frequent. Difficult to describe how they feel, occur at random occasions both at rest and with activity. Does not exercise consistently but does her own shopping. Has pain in the tops of both of her legs which limits her walking.  Recent fall with injury to left wrist, asks if I think it is broken.  Has also fallen backwards and hurt her back. When questioned about the symptoms that lead to falls, she states her legs hurt but she also occasionally gets dizzy. Does not monitor BP at home. Constantly feels cold. 2D echo 10/08/21  revealed normal pumping function at 60-65%, mild LVH, normal diastolic parameters, trivial TR. Lower extremity vascular studies revealed normal ABI bilaterally but slightly abnormal waveforms distally, mostly biphasic. She had normal velocities throughout the lower extremities. She was referred to Dr. Kirke Corin for [facility] consult and seen 10/16/21. Dr. Kirke Corin felt that symptoms more consistent with lumbar spine etiology and no further testing needed.   Today, she is here for follow-up. Continues to feel fatigued. Wonders if it is coming from her teeth, needs dental work for tooth decay. One episode of chest pain that improved after belching. Continues to have discomfort in bilateral thighs. Reassurance from PV work-up and office visit with Dr. Kirke Corin. Admits that she needs to exercise, is planning to go the gym with her daughter. Started smoking at age 77, still smoking 1/2 ppd. Quit smoking for 2 weeks then resumed after her brother died a few years ago. She denies  shortness of breath, lower extremity edema, palpitations, melena, hematuria, hemoptysis, diaphoresis, weakness, presyncope, syncope, orthopnea, and PND. Has fallen a few times recently. Fall occurs after ringing in ears, she becomes dizzy but room is not spinning.   Past Medical History:  Diagnosis Date   Allergy    Anxiety    Arthritis    RA   Asthma    Chronic back pain    COPD (chronic obstructive pulmonary disease) (HCC)    Depression    Esophageal stricture    GERD (gastroesophageal reflux disease)    Heart murmur  Hematemesis 09/10/2018   Hyperlipidemia    Hypertension    IBS (irritable bowel syndrome)    Interstitial cystitis    Neuromuscular disorder (HCC)    fibromyalgia   PONV (postoperative nausea and vomiting)     Past Surgical History:  Procedure Laterality Date   ABDOMINAL HYSTERECTOMY     APPENDECTOMY     BIOPSY  09/11/2018   Procedure: BIOPSY;  Surgeon: Meridee Score Netty Starring., MD;  Location: [facility] ENDOSCOPY;  Service:  Gastroenterology;;   CERVICAL FUSION     CERVICAL LAMINECTOMY     CORONARY STENT INTERVENTION N/A 10/07/2019   Procedure: CORONARY STENT INTERVENTION;  Surgeon: Kathleene Hazel, MD;  Location: [facility] INVASIVE CV LAB;  Service: Cardiovascular;  Laterality: N/A;   ESOPHAGOGASTRODUODENOSCOPY     ESOPHAGOGASTRODUODENOSCOPY (EGD) WITH PROPOFOL N/A 09/11/2018   Procedure: ESOPHAGOGASTRODUODENOSCOPY (EGD) WITH PROPOFOL;  Surgeon: Meridee Score Netty Starring., MD;  Location: [facility] ENDOSCOPY;  Service: Gastroenterology;  Laterality: N/A;   FINGER SURGERY     KYPHOPLASTY N/A 03/29/2021   Procedure: Lumbar Three KYPHOPLASTY;  Surgeon: Barnett Abu, MD;  Location: [facility] OR;  Service: Neurosurgery;  Laterality: N/A;   LEFT HEART CATH AND CORONARY ANGIOGRAPHY N/A 10/07/2019   Procedure: LEFT HEART CATH AND CORONARY ANGIOGRAPHY;  Surgeon: Kathleene Hazel, MD;  Location: [facility] INVASIVE CV LAB;  Service: Cardiovascular;  Laterality: N/A;   LUMBAR FUSION     SAVORY DILATION N/A 09/11/2018   Procedure: SAVORY DILATION;  Surgeon: Meridee Score Netty Starring., MD;  Location: [facility] ENDOSCOPY;  Service: Gastroenterology;  Laterality: N/A;   TONSILLECTOMY      Current Medications: Current Meds  Medication Sig   albuterol (VENTOLIN HFA) 108 (90 Base) MCG/ACT inhaler INHALE TWO PUFFS BY MOUTH INTO LUNGS every SIX hours AS NEEDED FOR WHEEZING AND/OR SHORTNESS OF BREATH   aspirin EC 81 MG tablet Take 1 tablet (81 mg total) by mouth daily. Swallow whole.   atorvastatin (LIPITOR) 40 MG tablet Take 1 tablet (40 mg total) by mouth every evening.   Blood Pressure Monitoring DEVI Use as needed DX I10   clopidogrel (PLAVIX) 75 MG tablet Take 1 tablet (75 mg total) by mouth daily.   fenofibrate (TRICOR) 145 MG tablet Take 1 tablet (145 mg total) by mouth daily.   fluticasone (FLONASE) 50 MCG/ACT nasal spray USE TWO SPRAYS in each nostril daily   furosemide (LASIX) 20 MG tablet TAKE ONE TABLET BY MOUTH daily AS NEEDED FOR LEG EDEMA    lidocaine (XYLOCAINE) 2 % solution Apply 5 ml  around gum of affected teeth up to 4 times per day.   losartan (COZAAR) 50 MG tablet TAKE ONE TABLET BY MOUTH EVERY MORNING   methimazole (TAPAZOLE) 10 MG tablet Take 2 tablets (20 mg total) by mouth every evening.   metoprolol succinate (TOPROL-XL) 50 MG 24 hr tablet Take 50 mg by mouth daily. Take with or immediately following a meal.   nicotine (NICODERM CQ - DOSED IN MG/24 HOURS) 14 mg/24hr patch Use for the 2nd 28 days   nicotine (NICODERM CQ - DOSED IN MG/24 HOURS) 21 mg/24hr patch Use for the first 28 days   nicotine (NICODERM CQ - DOSED IN MG/24 HR) 7 mg/24hr patch Use last   oxyCODONE-acetaminophen (PERCOCET) 5-325 MG tablet Take 1-2 tablets by mouth every 4 (four) hours as needed for moderate pain.   pantoprazole (PROTONIX) 40 MG tablet TAKE ONE TABLET BY MOUTH EVERY MORNING   venlafaxine XR (EFFEXOR-XR) 150 MG 24 hr capsule TAKE ONE TABLET BY MOUTH EVERY MORNING  Allergies:   Codeine, Lisinopril, Sulfonamide derivatives, and Tetracyclines & related   Social History   Socioeconomic History   Marital status: Single    Spouse name: Not on file   Number of children: Not on file   Years of education: Not on file   Highest education level: Not on file  Occupational History   Occupation: disability  Tobacco Use   Smoking status: Every Day    Packs/day: 0.50    Types: Cigarettes   Smokeless tobacco: Never   Tobacco comments:    trying to quit   Vaping Use   Vaping Use: Never used  Substance and Sexual Activity   Alcohol use: Never    Comment: occasional   Drug use: Yes    Types: Marijuana    Comment: occas   Sexual activity: Not on file  Other Topics Concern   Not on file  Social History Narrative   Not on file   Social Determinants of Health   Financial Resource Strain: Low Risk  (09/21/2021)   Overall Financial Resource Strain (CARDIA)    Difficulty of Paying Living Expenses: Not very hard  Food Insecurity: No  Food Insecurity (12/03/2021)   Hunger Vital Sign    Worried About Running Out of Food in the Last Year: Never true    Ran Out of Food in the Last Year: Never true  Transportation Needs: No Transportation Needs (12/03/2021)   PRAPARE - Administrator, Civil Service (Medical): No    Lack of Transportation (Non-Medical): No  Physical Activity: Insufficiently Active (06/13/2021)   Exercise Vital Sign    Days of Exercise per Week: 3 days    Minutes of Exercise per Session: 10 min  Stress: No Stress Concern Present (06/13/2021)   [employer] of Occupational Health - Occupational Stress Questionnaire    Feeling of Stress : Only a little  Social Connections: Moderately Integrated (06/13/2021)   Social Connection and Isolation Panel [NHANES]    Frequency of Communication with Friends and Family: More than three times a week    Frequency of Social Gatherings with Friends and Family: More than three times a week    Attends Religious Services: More than 4 times per year    Active Member of [employer] or Organizations: Yes    Attends Engineer, structural: More than 4 times per year    Marital Status: Divorced     Family History: The patient's family history includes Aneurysm in her brother; Cancer in her brother; Coronary artery disease in her father, paternal aunt, and paternal grandmother; Dementia in her mother; Esophageal cancer in her brother; Heart attack in her father; Stomach cancer in her paternal aunt. There is no history of Rectal cancer, Colon cancer, or Thyroid disease.  ROS:   Please see the history of present illness.    + cold intolerance + DOE + sharp chest pain + claudication + fatigue All other systems reviewed and are negative.  Labs/Other Studies Reviewed:    The following studies were reviewed today:  Vas Carotid US 10/18/21  Right Carotid: Velocities in the right ICA are consistent with a 1-39%  stenosis.                Non-hemodynamically  significant plaque <50% noted in the  CCA.   Left Carotid: Velocities in the left ICA are consistent with a 1-39%  stenosis.   Vertebrals: Bilateral vertebral arteries demonstrate antegrade flow.  Subclavians: Right subclavian artery flow was disturbed. Normal  flow               hemodynamics were seen in the left subclavian artery.   *See table(s) above for measurements and observations.   Echo 10/08/21  1. Left ventricular ejection fraction, by estimation, is 60 to 65%. The  left ventricle has normal function. The left ventricle has no regional  wall motion abnormalities. There is mild left ventricular hypertrophy.  Left ventricular diastolic parameters  were normal.   2. Right ventricular systolic function is normal. The right ventricular  size is normal. There is normal pulmonary artery systolic pressure. The  estimated right ventricular systolic pressure is 19.5 mmHg.   3. The mitral valve is normal in structure. Trivial mitral valve  regurgitation. No evidence of mitral stenosis.   4. The aortic valve is tricuspid. Aortic valve regurgitation is trivial.  No aortic stenosis is present.   5. The inferior vena cava is normal in size with greater than 50%  respiratory variability, suggesting right atrial pressure of 3 mmHg.   ABIs 10/03/21  Summary:  Right: The right toe-brachial index is normal.  Although ankle brachial indices are within normal limits (0.95-1.29),  arterial Doppler waveforms at the ankle suggest some component of arterial  occlusive disease.  Left: The left toe-brachial index is normal.  Although ankle brachial indices are within normal limits (0.95-1.29),  arterial Doppler waveforms at the ankle suggest some component of arterial  occlusive disease.    LHC 09/2019 Mid RCA lesion is 50% stenosed. 2nd Mrg lesion is 80% stenosed. Mid Cx lesion is 90% stenosed. Prox LAD lesion is 40% stenosed. A drug-eluting stent was successfully placed using a STENT  RESOLUTE ONYX [id number]. Post intervention, there is a 0% residual stenosis. A drug-eluting stent was successfully placed using a STENT RESOLUTE ONYX [id number]. Post intervention, there is a 0% residual stenosis. The left ventricular systolic function is normal. LV end diastolic pressure is normal. There is no mitral valve regurgitation. The left ventricular ejection fraction is 55-65% by visual estimate.   1. Mild non-obstructive disease in the proximal to mid LAD 2. Severe stenosis mid Circumflex into OM1 3. Successful PTCA/DES x 2 Mid Circumflex into OM1 4. Moderate non-obstructive disease in the mid RCA 5. Normal LV systolic function.    Recommendations: Continue DAPT with ASA and Brilinta for at least 6 months post stenting.  Continue statin. Consider beta blocker therapy at follow up. Same day discharge post PCI if stable.   VAS LE (DVT) 09/2018  Right: There is no evidence of deep vein thrombosis in the lower  extremity.  Left: No evidence of common femoral vein obstruction.     Echo 10/2017  Left ventricle: The cavity size was normal. There was mild    concentric hypertrophy. Systolic function was vigorous. The    estimated ejection fraction was in the range of 65% to 70%. Wall    motion was normal; there were no regional wall motion    abnormalities. Left ventricular diastolic function parameters    were normal.  - Aortic valve: Valve area (VTI): 2.38 cm^2. Valve area (Vmax):    2.41 cm^2. Valve area (Vmean): 2.45 cm^2.  - Aortic root: The aortic root was normal in size.  - Mitral valve: Structurally normal valve. There was mild    regurgitation.  - Left atrium: The atrium was at the upper limits of normal in    size.  - Right ventricle: The cavity size was normal. Wall thickness was  normal. Systolic function was normal.  - Right atrium: The atrium was normal in size.  - Tricuspid valve: There was mild regurgitation.  - Pulmonary arteries: Systolic pressure was  within the normal    range.  - Inferior vena cava: The vessel was normal in size.  - Pericardium, extracardiac: There was no pericardial effusion.    Recent Labs: 02/09/2021: Pro B Natriuretic peptide (BNP) 51.0 03/29/2021: Hemoglobin 12.0; Platelets 226 04/25/2021: TSH 2.94 10/08/2021: ALT 13; BUN 24; Creatinine, Ser 1.00; Potassium 4.7; Sodium 140  Recent Lipid Panel    Component Value Date/Time   CHOL 137 10/08/2021 0000   TRIG 236 (H) 10/08/2021 0000   HDL 39 (L) 10/08/2021 0000   CHOLHDL 3.5 10/08/2021 0000   CHOLHDL 3.5 09/03/2019 1353   VLDL 36.0 07/23/2019 1441   LDLCALC 60 10/08/2021 0000   LDLCALC 81 09/03/2019 1353   LDLDIRECT 123.0 03/14/2017 0948     Risk Assessment/Calculations:      Physical Exam:    VS:  BP 118/70   Pulse 67   Ht 5\' 9"  (1.753 m)   Wt 173 lb (78.5 kg)   SpO2 99%   BMI 25.55 kg/m     Wt Readings from Last 3 Encounters:  12/11/21 173 lb (78.5 kg)  10/16/21 173 lb 9.6 oz (78.7 kg)  10/09/21 174 lb 2 oz (79 kg)     GEN:  Well nourished, well developed in no acute distress HEENT: Normal NECK: No JVD; Right carotid bruits CARDIAC: RRR, no murmurs, rubs, gallops RESPIRATORY:  Clear to auscultation without rales, wheezing or rhonchi  ABDOMEN: Soft, non-tender, non-distended MUSCULOSKELETAL:  Mild LE edema. No deformity. 2+ pedal pulses, equal bilaterally SKIN: Warm and dry NEUROLOGIC:  Alert and oriented x 3 PSYCHIATRIC:  Normal affect   EKG:  EKG is not ordered today.  Diagnoses:    1. Essential hypertension   2. Coronary artery disease involving native coronary artery of native heart without angina pectoris   3. Hyperlipidemia LDL goal <70   4. PAD (peripheral artery disease) (HCC)   5. Tobacco abuse   6. Hypertriglyceridemia   7. Tobacco use   8. Right carotid bruit   9. Dizziness   10. Frequent falls   11. Bilateral carotid artery stenosis     Assessment and Plan:     CAD without angina: Mild non-obstructive CAD in  proximal to mid LAD, severe stenosis mid circumflex into OM1 with successful PTCA/DES x 2 mid circumflex to OM1, moderate non-obstructive disease in mRCA on cath 09/2019. One recent episode of CP relieved with belching. No rwma on echo. No indication for ischemia evaluation at this time. No bleeding concerns. Continue Plavix, aspirin, atorvastatin, metoprolol, losartan. Emphasized the importance of secondary prevention including 150 minutes of moderate intensity exercise each week, low sodium heart healthy diet, tobacco cessation, and cholesterol management.   Hypertension: BP is well controlled. Does not monitor at home. No medication changes today. Advised her to monitor BP and HR on a consistent basis in the setting of dizziness and report back to [place] if she has concerns.  Dizziness/Frequent falls: Two recent falls 2/2 dizziness. Reports she has ringing in her ears and then gets dizzy, falls. Does not sound like vertigo. No LOC. Advised her to monitor BP on a consistent basis and report back to [place] if it is running low. No significant stenosis on carotid ultrasound. Feels like her blood sugar drops on occasion. Advised her to follow-up with PCP for additional causes of  dizziness.   Hyperlipidemia LDL goal < 70/Hypertriglyceridemia: LDL 60, Triglycerides 236 on 10/08/21.   Fenofibrate 145 mg daily added in addition to atorvastatin.  Recommendation to reduce simple carbohydrates and repeat lipid/ALT in 2 months.   Tobacco abuse: Smoking 1/2 ppd. Is motivated to quit. Prescription for nicotine patches given. Complete cessation advised.   PAD: Referred to Dr. Kirke Corin and seen on 10/16/2021. Evidence of mild nonobstructive atherosclerosis affecting lower extremities. Symptoms of low back and leg pain felt to be more consistent with lumbar spine etiology. No further testing recommended.  Encouraged 150 minutes of moderate intensity exercise each week.   Mild carotid artery stenosis: Heard on exam by Dr. Kirke Corin  10/16/21.  Carotid duplex 10/18/2021 revealed mild 1 to 39% stenosis bilaterally. Is having some dizziness. Advis     Disposition: 6 months with Dr. Clifton James  Medication Adjustments/Labs and Tests Ordered: Current medicines are reviewed at length with the patient today.  Concerns regarding medicines are outlined above.  No orders of the defined types were placed in this encounter.  Meds ordered this encounter  Medications   ondansetron (ZOFRAN ODT) 8 MG disintegrating tablet    Sig: Take 1 tablet (8 mg total) by mouth every 8 (eight) hours as needed for nausea or vomiting.    Dispense:  15 tablet    Refill:  0    Order Specific Question:   Supervising Provider    Answer:   Elease Hashimoto, PHILIP J [[id number]]   nicotine (NICODERM CQ - DOSED IN MG/24 HOURS) 21 mg/24hr patch    Sig: Use for the first 28 days    Dispense:  28 patch    Refill:  0    Order Specific Question:   Supervising Provider    Answer:   Elease Hashimoto, PHILIP J [[id number]]   nicotine (NICODERM CQ - DOSED IN MG/24 HOURS) 14 mg/24hr patch    Sig: Use for the 2nd 28 days    Dispense:  28 patch    Refill:  0    Order Specific Question:   Supervising Provider    Answer:   Elease Hashimoto, PHILIP J [[id number]]   nicotine (NICODERM CQ - DOSED IN MG/24 HR) 7 mg/24hr patch    Sig: Use last    Dispense:  28 patch    Refill:  0    Order Specific Question:   Supervising Provider    Answer:   Vesta Mixer [phone]    Patient Instructions  Medication Instructions:   START Nicotine patches follow instructions on package.   *If you need a refill on your cardiac medications before your next appointment, please call your pharmacy*   Lab Work:  None ordered.  If you have labs (blood work) drawn today and your tests are completely normal, you will receive your results only by: MyChart Message (if you have MyChart) OR A paper copy in the mail If you have any lab test that is abnormal or we need to change your treatment, we will call you to review the  results.   Testing/Procedures:  None ordered.   Follow-Up: At [facility], you and your health needs are our priority.  As part of our continuing mission to provide you with exceptional heart care, we have created designated Provider Care Teams.  These Care Teams include your primary Cardiologist (physician) and Advanced Practice Providers (APPs -  Physician Assistants and Nurse Practitioners) who all work together to provide you with the care you need, when you need it.  We recommend  signing up for the patient portal called "MyChart".  Sign up information is provided on this After Visit Summary.  MyChart is used to connect with patients for Virtual Visits (Telemedicine).  Patients are able to view lab/test results, encounter notes, upcoming appointments, etc.  Non-urgent messages can be sent to your provider as well.   To learn more about what you can do with MyChart, go to [email or web address].    Your next appointment:   6 month(s)  The format for your next appointment:   In Person  Provider:   Verne Carrow, MD      Important Information About Sugar         Signed, Levi Aland, NP  12/11/2021 9:48 AM     [facility]

## 2021-12-10 ENCOUNTER — Telehealth: Payer: Self-pay | Admitting: Pharmacist

## 2021-12-10 NOTE — Chronic Care Management (AMB) (Unsigned)
Chronic Care Management Pharmacy Assistant   Name: ROSEY EIDE  MRN: [id number] DOB: 06/13/1955  Reason for Encounter: Medication Review / Medication Coordination Call   Recent office visits:  None  Recent consult visits:  None  Hospital visits:  Patient was seen at [facility] ED on 12/01/2021 (1 hour) due to Puncture wound of left hand without foreign body, initial encounter.   New?Medications Started at [facility] Discharge:?? No new medications Medication Changes at Hospital Discharge: No medication changes Medications Discontinued at Hospital Discharge: No medications discontinued Medications that remain the same after Hospital Discharge:??  -All other medications will remain the same.    Medications: Outpatient Encounter Medications as of 12/10/2021  Medication Sig   albuterol (VENTOLIN HFA) 108 (90 Base) MCG/ACT inhaler INHALE TWO PUFFS BY MOUTH INTO LUNGS every SIX hours AS NEEDED FOR WHEEZING AND/OR SHORTNESS OF BREATH   aspirin EC 81 MG tablet Take 1 tablet (81 mg total) by mouth daily. Swallow whole.   atorvastatin (LIPITOR) 40 MG tablet Take 1 tablet (40 mg total) by mouth every evening.   Blood Pressure Monitoring DEVI Use as needed DX I10   clopidogrel (PLAVIX) 75 MG tablet Take 1 tablet (75 mg total) by mouth daily.   fenofibrate (TRICOR) 145 MG tablet Take 1 tablet (145 mg total) by mouth daily.   fluticasone (FLONASE) 50 MCG/ACT nasal spray USE TWO SPRAYS in each nostril daily   furosemide (LASIX) 20 MG tablet TAKE ONE TABLET BY MOUTH daily AS NEEDED FOR LEG EDEMA   lidocaine (XYLOCAINE) 2 % solution Apply 5 ml  around gum of affected teeth up to 4 times per day.   losartan (COZAAR) 50 MG tablet TAKE ONE TABLET BY MOUTH EVERY MORNING   methimazole (TAPAZOLE) 10 MG tablet Take 2 tablets (20 mg total) by mouth every evening.   metoprolol succinate (TOPROL-XL) 50 MG 24 hr tablet Take 50 mg by mouth daily. Take with or immediately following a meal.    nitroGLYCERIN (NITROSTAT) 0.4 MG SL tablet Place 1 tablet (0.4 mg total) under the tongue every 5 (five) minutes as needed for chest pain.   ondansetron (ZOFRAN ODT) 8 MG disintegrating tablet Take 1 tablet (8 mg total) by mouth every 8 (eight) hours as needed for nausea or vomiting.   oxyCODONE-acetaminophen (PERCOCET) 5-325 MG tablet Take 1-2 tablets by mouth every 4 (four) hours as needed for moderate pain.   pantoprazole (PROTONIX) 40 MG tablet TAKE ONE TABLET BY MOUTH EVERY MORNING   venlafaxine XR (EFFEXOR-XR) 150 MG 24 hr capsule TAKE ONE TABLET BY MOUTH EVERY MORNING   No facility-administered encounter medications on file as of 12/10/2021.   Reviewed chart for medication changes ahead of medication coordination call.  BP Readings from Last 3 Encounters:  12/01/21 (!) 142/69  10/16/21 134/80  10/09/21 138/80    Lab Results  Component Value Date   HGBA1C 5.4 09/13/2018     Patient obtains medications through Adherence Packaging  30 Days    Last adherence delivery included:  Aspirin EC 81 mg: one tablet at dinner Furosemide 20 mg 1 tablet daily as needed Venlafaxine XR 150 mg: one capsule with breakfast Metoprolol succinate 50 MG 24 hr: one tablet at breakfast Losartan 50 mg: one tablet with breakfast Fluticasone 50 MCG/ACT nasal spray: Place 2 sprays into both nostrils daily Albuterol HFA : 2 puffs every 6 hours as needed Methimazole 10 mg: two tablets at dinner  Pantoprazole 40 mg: one tablet at breakfast Clopidogrel 75 mg:  one tablet at breakfast  Atorvastatin 40 mg: one tablet at dinner Fenofibrate 145 mg: one tablet at dinner   Patient declined (meds) last month:  Unable to reach patient     Patient is due for next adherence delivery on: 12/20/2021   Called patient and reviewed medications and coordinated delivery.   This delivery to include: Aspirin EC 81 mg: one tablet at dinner Furosemide 20 mg 1 tablet daily as needed Venlafaxine XR 150 mg: one capsule  with breakfast Metoprolol succinate 50 MG 24 hr: one tablet at breakfast Losartan 50 mg: one tablet with breakfast Fluticasone 50 MCG/ACT nasal spray: Place 2 sprays into both nostrils daily Albuterol HFA : 2 puffs every 6 hours as needed Methimazole 10 mg: two tablets at dinner  Pantoprazole 40 mg: one tablet at breakfast Clopidogrel 75 mg: one tablet at breakfast  Atorvastatin 40 mg: one tablet at dinner Fenofibrate 145 mg: one tablet at dinner Ondansetron 8 mg: 1 tablet every 8 hours as needed for nausea or vomiting   Patient will need a short fill: No short fills   Coordinated acute fill: No acute fills   Patient declined the following medications: No medications declined  Confirmed delivery date of 12/20/2021, advised patient that pharmacy will contact them the morning of delivery.  Care Gaps: AWV - scheduled 06/17/2022 Last BP - 142/69 on 12/01/2021 Last A1C - 5.4 on 09/13/2018 Shingrix - never done Covid - overdue Pneumonia - postponed Dexascan - postponed  Star Rating Drugs: Atorvastatin 40 mg - last filled 40 mg 11/19/2021 30 DS at [facility] Losartan  50 mg - last filled 50 mg 11/19/2021 30 DS at [place] [phone]

## 2021-12-11 ENCOUNTER — Ambulatory Visit: Payer: Medicare Other | Attending: Nurse Practitioner | Admitting: Nurse Practitioner

## 2021-12-11 ENCOUNTER — Encounter: Payer: Self-pay | Admitting: Nurse Practitioner

## 2021-12-11 ENCOUNTER — Telehealth: Payer: Self-pay | Admitting: *Deleted

## 2021-12-11 VITALS — BP 118/70 | HR 67 | Ht 69.0 in | Wt 173.0 lb

## 2021-12-11 DIAGNOSIS — R0989 Other specified symptoms and signs involving the circulatory and respiratory systems: Secondary | ICD-10-CM | POA: Diagnosis not present

## 2021-12-11 DIAGNOSIS — E785 Hyperlipidemia, unspecified: Secondary | ICD-10-CM | POA: Diagnosis not present

## 2021-12-11 DIAGNOSIS — I739 Peripheral vascular disease, unspecified: Secondary | ICD-10-CM | POA: Diagnosis not present

## 2021-12-11 DIAGNOSIS — I2510 Atherosclerotic heart disease of native coronary artery without angina pectoris: Secondary | ICD-10-CM

## 2021-12-11 DIAGNOSIS — R42 Dizziness and giddiness: Secondary | ICD-10-CM

## 2021-12-11 DIAGNOSIS — E781 Pure hyperglyceridemia: Secondary | ICD-10-CM | POA: Diagnosis not present

## 2021-12-11 DIAGNOSIS — R296 Repeated falls: Secondary | ICD-10-CM

## 2021-12-11 DIAGNOSIS — Z720 Tobacco use: Secondary | ICD-10-CM

## 2021-12-11 DIAGNOSIS — I10 Essential (primary) hypertension: Secondary | ICD-10-CM | POA: Diagnosis not present

## 2021-12-11 DIAGNOSIS — I6523 Occlusion and stenosis of bilateral carotid arteries: Secondary | ICD-10-CM

## 2021-12-11 MED ORDER — NICOTINE 14 MG/24HR TD PT24: MEDICATED_PATCH | TRANSDERMAL | 0 refills | Status: DC

## 2021-12-11 MED ORDER — NICOTINE 21 MG/24HR TD PT24
MEDICATED_PATCH | TRANSDERMAL | 0 refills | Status: DC
Start: 2021-12-11 — End: 2022-06-10

## 2021-12-11 MED ORDER — ONDANSETRON 8 MG PO TBDP: 8.0000 mg | ORAL_TABLET | Freq: Three times a day (TID) | ORAL | 0 refills | Status: DC | PRN

## 2021-12-11 MED ORDER — NICOTINE 7 MG/24HR TD PT24
MEDICATED_PATCH | TRANSDERMAL | 0 refills | Status: DC
Start: 2021-12-11 — End: 2022-06-10

## 2021-12-11 NOTE — Patient Instructions (Addendum)
Medication Instructions:   START Nicotine patches follow instructions on package.   *If you need a refill on your cardiac medications before your next appointment, please call your pharmacy*   Lab Work:  None ordered.  If you have labs (blood work) drawn today and your tests are completely normal, you will receive your results only by: [place] (if you have MyChart) OR A paper copy in the mail If you have any lab test that is abnormal or we need to change your treatment, we will call you to review the results.   Testing/Procedures:  None ordered.   Follow-Up: At [facility], you and your health needs are our priority.  As part of our continuing mission to provide you with exceptional heart care, we have created designated Provider Care Teams.  These Care Teams include your primary Cardiologist (physician) and Advanced Practice Providers (APPs -  Physician Assistants and Nurse Practitioners) who all work together to provide you with the care you need, when you need it.  We recommend signing up for the patient portal called "MyChart".  Sign up information is provided on this After Visit Summary.  MyChart is used to connect with patients for Virtual Visits (Telemedicine).  Patients are able to view lab/test results, encounter notes, upcoming appointments, etc.  Non-urgent messages can be sent to your provider as well.   To learn more about what you can do with MyChart, go to [email or web address].    Your next appointment:   6 month(s)  The format for your next appointment:   In Person  Provider:   Lauree Chandler, MD      Important Information About Sugar

## 2021-12-11 NOTE — Telephone Encounter (Signed)
Lvm for pt to call office to r/s fasting lipid and alt.  Pt No showed October 18.

## 2021-12-12 NOTE — Patient Outreach (Signed)
  Care Coordination   Initial Visit Note   12/12/2021 Name: DALENA PLANTZ MRN: [id number] DOB: 03/25/1955  ANIELLE HEADRICK is a 66 y.o. year old female who sees Nafziger, Tommi Rumps, NP for primary care. I spoke with  [name] by phone today.  What matters to the patients health and wellness today?  Caregiver Stress/Care Coordination    Goals Addressed             This Visit's Progress    COMPLETED: LCSW Care Coordination Activities   On track    Care Coordination Interventions: Active listening / Reflection utilized  Caregiver stress acknowledged  LCSW informed pt that a referral was completed to assist with caregiver stress Patient reports that she is doing well and is not in need of any services from LCSW at this time. LCSW encouraged pt to reach out if any needs arise         SDOH assessments and interventions completed:  No     Care Coordination Interventions Activated:  Yes  Care Coordination Interventions:  Yes, provided   Follow up plan: No further intervention required.   Encounter Outcome:  Pt. Refused   [name], MSW, [email or web address] Phone [phone] 7:14 PM

## 2021-12-12 NOTE — Patient Instructions (Signed)
Visit Information  Thank you for taking time to visit with me today. Please don't hesitate to contact me if I can be of assistance to you.   Following are the goals we discussed today:   Goals Addressed             This Visit's Progress    COMPLETED: LCSW Care Coordination Activities   On track    Care Coordination Interventions: Active listening / Reflection utilized  Caregiver stress acknowledged  LCSW informed pt that a referral was completed to assist with caregiver stress Patient reports that she is doing well and is not in need of any services from LCSW at this time. LCSW encouraged pt to reach out if any needs arise          If you are experiencing a Mental Health or [place] or need someone to talk to, please call the Suicide and Crisis Lifeline: 988 call 911   Patient verbalizes understanding of instructions and care plan provided today and agrees to view in [name]. Active MyChart status and patient understanding of how to access instructions and care plan via MyChart confirmed with patient.     No further follow up required:    [name], MSW, [name] Phone [phone] 7:14 PM

## 2021-12-17 ENCOUNTER — Other Ambulatory Visit: Payer: Self-pay | Admitting: Internal Medicine

## 2021-12-19 DIAGNOSIS — M5416 Radiculopathy, lumbar region: Secondary | ICD-10-CM | POA: Diagnosis not present

## 2021-12-25 ENCOUNTER — Telehealth: Payer: Self-pay | Admitting: Pharmacist

## 2021-12-25 ENCOUNTER — Encounter: Payer: Self-pay | Admitting: *Deleted

## 2021-12-25 NOTE — Chronic Care Management (AMB) (Signed)
    Chronic Care Management Pharmacy Assistant   Name: JAIMI BELLE  MRN: [id number] DOB: Jun 12, 1955  12/25/2021 APPOINTMENT Young D Kerins, No answer, left message of appointment on 12/25/2021 at 3:45 via telephone visit with Jeni Salles, Pharm D. Notified to have all medications, supplements, blood pressure and/or blood sugar logs available during appointment and to return call if need to reschedule.  Care Gaps: AWV - scheduled 06/17/2022 Last BP - 118/70 on 12/11/2021 Last A1C - 5.4 on 09/13/2018 Shingrix - never done Covid - overdue Pneumonia - postponed Dexa scan - postponed  Star Rating Drug: Atorvastatin 40 mg - last filled 40 mg 12/17/2021 30 DS at [facility] Losartan  50 mg - last filled 50 mg 12/17/2021 30 DS at [facility]  Any gaps in medications fill history? No  Gennie Alma [facility]  Catering manager [phone]

## 2021-12-25 NOTE — Telephone Encounter (Signed)
Sent pt letter to call office to schedule labs.

## 2021-12-26 ENCOUNTER — Telehealth: Payer: Medicare Other

## 2021-12-26 NOTE — Progress Notes (Deleted)
Chronic Care Management Pharmacy Note  12/26/2021 Name:  Laurie [name] MRN:  [id number] DOB:  [birth year]  Summary: Patient has been under a lot of stress Pt is overdue for annual exam and routine screenings  Recommendations/Changes made from today's visit: -Recommended setting up a visit with behavioral health and provided phone number to call [phone]) -Recommended follow up with PCP for annual exam -Provided telephone number for gastroenterology to schedule colonoscopy and GI breast center for DEXA scan  Plan: Set up PCP visit for TOC Follow up in 3 months  Subjective: [name] is an 66 y.o. year old female who is a primary patient of Laurie Peng, NP.  The CCM team was consulted for assistance with disease management and care coordination needs.    Engaged with patient by telephone for follow up visit in response to provider referral for pharmacy case management and/or care coordination services.   Consent to Services:  The patient was given information about Chronic Care Management services, agreed to services, and gave verbal consent prior to initiation of services.  Please see initial visit note for detailed documentation.   Patient Care Team: Laurie Peng, NP as PCP - General (Family Medicine) Burnell Blanks, MD as PCP - Cardiology (Cardiology) Viona Gilmore, [facility] as Pharmacist (Pharmacist)  Recent office visits: 10/09/2021 Laurie Martinique MD - Patient was seen for Gingival and periodontal disease and a toothache. Started Augmentin and Lidocaine 2% solution. Follow up if symptoms worsen or fail to improve, for Teethache with PCP.   09/27/2021 Laurie Peng NP - Patient was seen for Encounter to establish care and additional concerns. No medication changes. Follow up for physical.    Recent consult visits: 12/19/21 Laurie Miss, MD (neurosurgery & spine): Patient presented for follow up. Unable to access notes.  12/11/21 Laurie Bame, NP  (cardiology): Patient presented for CAD follow up. Prescribed nicotine patches.  10/16/2021 Laurie Sacramento MD (cardiology) - Patient was seen for PAD (peripheral artery disease) and additional concerns. No medicaiton changes. Follow up as needed with Dr. Fletcher Anon.   10/12/2021 Laurie Mulders MD (orthopedic) - Patient was seen for Fall, initial encounter and additional concerns. No medication changes. Follow up as needed.   09/25/2021 Laurie Bame NP (cardiology) - Patient was seen for Coronary artery disease involving native coronary artery of native heart without angina pectoris and additional concerns. Discontinued Guaifenesin, Menthol and Mupirocin. Follow up in 2 months.    Hospital visits: Patient was seen at [facility] ED on 12/01/2021 (1 hour) due to Puncture wound of left hand without foreign body, initial encounter.   New?Medications Started at [facility] Discharge:?? No new medications Medication Changes at Hospital Discharge: No medication changes Medications Discontinued at Hospital Discharge: No medications discontinued Medications that remain the same after Hospital Discharge:??  -All other medications will remain the same.   Objective:  Lab Results  Component Value Date   CREATININE 1.00 10/08/2021   BUN 24 10/08/2021   GFR 73.56 02/09/2021   GFRNONAA >60 03/29/2021   GFRAA 76 09/27/2019   NA 140 10/08/2021   K 4.7 10/08/2021   CALCIUM 9.2 10/08/2021   CO2 27 10/08/2021    Lab Results  Component Value Date/Time   HGBA1C 5.4 09/13/2018 03:11 AM   GFR 73.56 02/09/2021 09:01 AM   GFR 69.04 02/23/2020 05:01 PM   MICROALBUR 2.7 (H) 02/09/2021 09:01 AM    Last diabetic Eye exam: No results found for: "HMDIABEYEEXA"  Last diabetic Foot exam: No results found for: "  HMDIABFOOTEX"   Lab Results  Component Value Date   CHOL 137 10/08/2021   HDL 39 (L) 10/08/2021   LDLCALC 60 10/08/2021   LDLDIRECT 123.0 03/14/2017   TRIG 236 (H) 10/08/2021   CHOLHDL 3.5  10/08/2021       Latest Ref Rng & Units 10/08/2021   12:00 AM 02/09/2021    9:01 AM 12/30/2020    9:00 PM  Hepatic Function  Total Protein 6.0 - 8.5 g/dL 6.7  7.2  7.4   Albumin 3.9 - 4.9 g/dL 4.3  4.1  4.3   AST 0 - 40 IU/L _0 ALT 0 - 32 IU/L _1 Alk Phosphatase 44 - 121 IU/L 116  111  107   Total Bilirubin 0.0 - 1.2 mg/dL <0.2  0.3  0.3     Lab Results  Component Value Date/Time   TSH 2.94 04/25/2021 11:51 AM   TSH 0.04 (L) 04/26/2020 09:49 AM   FREET4 0.69 04/25/2021 11:51 AM   FREET4 0.69 04/26/2020 09:49 AM       Latest Ref Rng & Units 03/29/2021    1:09 PM 12/30/2020    9:00 PM 02/23/2020    5:01 PM  CBC  WBC 4.0 - 10.5 K/uL 8.6  9.5  6.9   Hemoglobin 12.0 - 15.0 g/dL 12.0  12.9  11.9   Hematocrit 36.0 - 46.0 % 38.1  39.9  35.3   Platelets 150 - 400 K/uL 226  224  206.0     Lab Results  Component Value Date/Time   VD25OH 38.94 03/14/2017 09:48 AM   VD25OH 16.55 (L) 08/18/2015 08:36 AM    Clinical ASCVD: Yes  The 10-year ASCVD risk score (Arnett DK, et al., 2019) is: 24.5%   Values used to calculate the score:     Age: 14 years     Sex: Female     Is Non-Hispanic African American: No     Diabetic: No     Tobacco smoker: Yes     Systolic Blood Pressure: 017 mmHg     Is BP treated: Yes     HDL Cholesterol: 39 mg/dL     Total Cholesterol: 137 mg/dL       12/03/2021   10:38 AM 12/03/2021   10:37 AM 11/22/2021   10:29 AM  Depression screen PHQ 2/9  Decreased Interest 0 0 0  Down, Depressed, Hopeless _2 PHQ - 2 Score _3 Social History   Tobacco Use  Smoking Status Every Day   Packs/day: 0.50   Types: Cigarettes  Smokeless Tobacco Never  Tobacco Comments   trying to quit    BP Readings from Last 3 Encounters:  12/11/21 118/70  12/01/21 (!) 142/69  10/16/21 134/80   Pulse Readings from Last 3 Encounters:  12/11/21 67  12/01/21 71  10/16/21 63   Wt Readings from Last 3 Encounters:  12/11/21 173 lb (78.5 kg)   10/16/21 173 lb 9.6 oz (78.7 kg)  10/09/21 174 lb 2 oz (79 kg)    Assessment/Interventions: Review of patient past medical history, allergies, medications, health status, including review of consultants reports, laboratory and other test data, was performed as part of comprehensive evaluation and provision of chronic care management services.   SDOH:  (Social Determinants of Health) assessments and interventions performed: Yes (last 09/18/21) SDOH Interventions    Flowsheet Row Telephone from 12/03/2021 in [facility]  Community Care Coordination Telephone from 11/22/2021 in [place] Coordination Chronic Care Management from 09/18/2021 in [place] at [place] from 06/13/2021 in [place] at [place] Management from 11/24/2019 in [place] at [place] Interventions Intervention Not Indicated Intervention Not Indicated -- Intervention Not Indicated --  Housing Interventions Intervention Not Indicated Intervention Not Indicated -- Intervention Not Indicated --  Transportation Interventions Intervention Not Indicated Intervention Not Indicated -- Intervention Not Indicated Intervention Not Indicated  Utilities Interventions Intervention Not Indicated Intervention Not Indicated -- -- --  Financial Strain Interventions -- -- Intervention Not Indicated Intervention Not Indicated Intervention Not Indicated  Physical Activity Interventions -- -- -- Intervention Not Indicated --  Stress Interventions -- -- -- Intervention Not Indicated --  Social Connections Interventions -- -- -- Intervention Not Indicated --       CCM Care Plan  Allergies  Allergen Reactions   Codeine Nausea And Vomiting   Lisinopril Cough   Sulfonamide Derivatives Nausea And Vomiting   Tetracyclines & Related Rash    Medications Reviewed Today     Reviewed by Emmaline Life,  NP (Nurse Practitioner) on 12/11/21 at [phone]  Med List Status: <None>   Medication Order Taking? Sig Documenting Provider Last Dose Status Informant  albuterol (VENTOLIN HFA) 108 (90 Base) MCG/ACT inhaler [id number] Yes INHALE TWO PUFFS BY MOUTH INTO LUNGS every SIX hours AS NEEDED FOR WHEEZING AND/OR SHORTNESS OF [place] Artist, Tommi Rumps, NP Taking Active   aspirin EC 81 MG tablet [id number] Yes Take 1 tablet (81 mg total) by mouth daily. Swallow whole. Burnell Blanks, MD Taking Active Self  atorvastatin (LIPITOR) 40 MG tablet [id number] Yes Take 1 tablet (40 mg total) by mouth every evening. Burnell Blanks, MD Taking Active   Blood Pressure Monitoring DEVI [id number] Yes Use as needed DX I10 [name], MD Taking Active   clopidogrel (PLAVIX) 75 MG tablet [id number] Yes Take 1 tablet (75 mg total) by mouth daily. Burnell Blanks, MD Taking Active   fenofibrate (TRICOR) 145 MG tablet [id number] Yes Take 1 tablet (145 mg total) by mouth daily. Swinyer, Lanice Schwab, NP Taking Active   fluticasone [facility]) 50 MCG/ACT nasal spray [id number] Yes USE TWO SPRAYS in each nostril daily Burchette, Alinda Sierras, MD Taking Active   furosemide (LASIX) 20 MG tablet [id number] Yes TAKE ONE TABLET BY MOUTH daily AS NEEDED FOR LEG EDEMA Burchette, Alinda Sierras, MD Taking Active   lidocaine (XYLOCAINE) 2 % solution [id number] Yes Apply 5 ml  around gum of affected teeth up to 4 times per day. [name], Laurie G, MD Taking Active   losartan (COZAAR) 50 MG tablet [id number] Yes TAKE ONE TABLET BY MOUTH EVERY MORNING Burchette, Alinda Sierras, MD Taking Active   methimazole (TAPAZOLE) 10 MG tablet [id number] Yes Take 2 tablets (20 mg total) by mouth every evening. Philemon Kingdom, MD Taking Active   metoprolol succinate (TOPROL-XL) 50 MG 24 hr tablet [id number] Yes Take 50 mg by mouth daily. Take with or immediately following a meal. [provider] Taking Active   nicotine (NICODERM CQ - DOSED IN MG/24 HOURS)  14 mg/24hr patch [id number] Yes Use for the 2nd 28 days Swinyer, Lanice Schwab, NP  Active   nicotine (NICODERM CQ - DOSED IN MG/24 HOURS) 21 mg/24hr patch [id number] Yes Use for the first 28 days Swinyer, Lanice Schwab, NP  Active   nicotine (NICODERM CQ - DOSED  IN MG/24 HR) 7 mg/24hr patch [id number] Yes Use last Swinyer, Lanice Schwab, NP  Active   nitroGLYCERIN (NITROSTAT) 0.4 MG SL tablet [id number]  Place 1 tablet (0.4 mg total) under the tongue every 5 (five) minutes as needed for chest pain. Furth, Cadence H, PA-C  Expired 01/17/20 2359   ondansetron (ZOFRAN ODT) 8 MG disintegrating tablet [id number]  Take 1 tablet (8 mg total) by mouth every 8 (eight) hours as needed for nausea or vomiting. Swinyer, Lanice Schwab, NP  Active   oxyCODONE-acetaminophen (PERCOCET) 5-325 MG tablet [id number] Yes Take 1-2 tablets by mouth every 4 (four) hours as needed for moderate pain. Delora Fuel, MD Taking Active   pantoprazole (PROTONIX) 40 MG tablet [id number] Yes TAKE ONE TABLET BY MOUTH EVERY MORNING Nafziger, Tommi Rumps, NP Taking Active   venlafaxine XR (EFFEXOR-XR) 150 MG 24 hr capsule [id number] Yes TAKE ONE TABLET BY MOUTH EVERY MORNING Burchette, Alinda Sierras, MD Taking Active             Patient Active Problem List   Diagnosis Date Noted   Fracture, lumbar vertebra, compression ([place]) 03/29/2021   Eye swelling 04/27/2020   Hyperthyroidism 02/19/2020   Coronary artery disease involving native coronary artery of native heart with unstable angina pectoris ([place])    Substance abuse ([place]) 07/23/2019   Renal insufficiency 09/10/2018   Elevated troponin 09/10/2018   Leukocytosis 09/10/2018   Hematemesis 09/09/2018   Opiate withdrawal ([place]) 09/09/2018   Vitamin D deficiency 06/10/2018   Shortness of breath 11/05/2017   Elevated brain natriuretic peptide (BNP) level 11/05/2017   Polysubstance (excluding opioids) dependence, daily use ([place]) 11/05/2017   AKI (acute kidney injury) ([place]) 11/05/2017   Low serum vitamin D  08/23/2015   Hyperlipidemia 11/08/2014   Reflux esophagitis 03/23/2014   Dysphagia, pharyngoesophageal phase 03/23/2014   Pain in the chest 03/23/2014   Special screening for malignant neoplasms, colon 07/09/2013   Chest pain 04/23/2012   Dyspnea 04/23/2012   Murmur 04/23/2012   INTERSTITIAL CYSTITIS 01/01/2010   SWELLING, NECK 01/01/2010   CAD, NATIVE VESSEL 12/19/2009   Palpitations 11/21/2009   ABNORMAL CV (STRESS) TEST 11/21/2009   Essential hypertension 10/31/2009   ELECTROCARDIOGRAM, ABNORMAL 10/25/2009   PLANTAR FASCIITIS, BILATERAL 10/12/2009   CERVICAL RADICULOPATHY 07/04/2008   Pain in soft tissues of limb 03/25/2008   DEGENERATIVE DISC DISEASE, CERVICAL SPINE, W/RADICULOPATHY 09/08/2007   LOW BACK PAIN 09/08/2007   TOBACCO USE 03/23/2007   OSTEOARTHROSIS, LOCAL NOS, LOWER LEG 11/07/2006   STATE, SYMPTOMATIC MENOPAUSE/FEM CLIMACTERIC 10/27/2006   Depression, recurrent ([place]) 09/15/2006   Thoracic or lumbosacral neuritis or radiculitis, unspecified 09/15/2006   IBS 09/09/2006   STRICTURE, ESOPHAGEAL 03/16/2001    Immunization History  Administered Date(s) Administered   Fluad Quad(high Dose 65+) 11/08/2021   Influenza Split 12/16/2011   Influenza Whole 11/15/2008, 10/25/2009   Influenza,inj,Quad PF,6+ Mos 11/18/2012, 11/29/2013, 11/08/2014, 03/14/2017, 12/20/2020   Janssen (J&J) SARS-COV-2 Vaccination 06/12/2019   Td 10/25/2009   Tdap 12/01/2021   Patient is in the process of switching PCPs here in the office but has not scheduled with her new provider. She is also overdue for a physical and colonoscopy and bone density scan. Provided her with telephone number to schedule colonoscopy.  Patient's son overdosed and they had to use Narcan to resussitate him. [place] with him. He has a girlfriend and she takes care of him and she went down to the beach when he was in the hospital. Life has been  stressful for her  lately.  -BP?  -Dexa? Colonoscopy?  --schedule CPE?  Conditions to be addressed/monitored:  Hypertension, Hyperlipidemia, Coronary Artery Disease, Depression, Tobacco use, Allergic Rhinitis and chronic pain  Conditions addressed this visit: Hypertension, pain, depression  There are no care plans that you recently modified to display for this patient.    Medication Assistance: None required.  Patient affirms current coverage meets needs.  Compliance/Adherence/Medication fill history: Care Gaps: Shingrix, DEXA, colonoscopy, COVID booster, Prevnar20 Last BP - 118/70 on 12/11/2021 Last A1C - 5.4 on 09/13/2018  Star-Rating Drugs: Atorvastatin 40 mg - last filled 40 mg 12/17/2021 30 DS at [facility] Losartan  50 mg - last filled 50 mg 12/17/2021 30 DS at [facility]  Patient's preferred pharmacy is:  [facility] - [place], [place] - [address] [place] [address] Phone: [phone] Fax: [phone]  [facility] [id number], [place]. [place] [address] Phone: [phone] Fax: [phone]   Uses pill box? No - adherence packaging Pt endorses 80% compliance - has not been taking clopidogrel x 2 weeks  We discussed: Benefits of medication synchronization, packaging and delivery as well as enhanced pharmacist oversight with [facility]. Patient decided to: Utilize [facility] pharmacy for medication synchronization, packaging and delivery  Care Plan and Follow Up Patient Decision:  Patient agrees to Care Plan and Follow-up.  Plan: Telephone follow up appointment with care management team member scheduled for:   3 months  Jeni Salles, PharmD [place] Pharmacist [place] [facility] [place] [phone]

## 2022-01-01 ENCOUNTER — Ambulatory Visit (INDEPENDENT_AMBULATORY_CARE_PROVIDER_SITE_OTHER): Payer: Medicare Other | Admitting: Adult Health

## 2022-01-01 ENCOUNTER — Encounter: Payer: Self-pay | Admitting: Adult Health

## 2022-01-01 ENCOUNTER — Ambulatory Visit (INDEPENDENT_AMBULATORY_CARE_PROVIDER_SITE_OTHER): Payer: Medicare Other | Admitting: Pharmacist

## 2022-01-01 VITALS — BP 130/88 | HR 70 | Temp 97.8°F | Ht 69.0 in | Wt 174.0 lb

## 2022-01-01 DIAGNOSIS — K056 Periodontal disease, unspecified: Secondary | ICD-10-CM | POA: Diagnosis not present

## 2022-01-01 DIAGNOSIS — I25110 Atherosclerotic heart disease of native coronary artery with unstable angina pectoris: Secondary | ICD-10-CM

## 2022-01-01 DIAGNOSIS — I10 Essential (primary) hypertension: Secondary | ICD-10-CM

## 2022-01-01 DIAGNOSIS — K069 Disorder of gingiva and edentulous alveolar ridge, unspecified: Secondary | ICD-10-CM | POA: Diagnosis not present

## 2022-01-01 MED ORDER — AMOXICILLIN-POT CLAVULANATE 875-125 MG PO TABS: 1.0000 | ORAL_TABLET | Freq: Two times a day (BID) | ORAL | 0 refills | Status: DC

## 2022-01-01 NOTE — Progress Notes (Signed)
Chronic Care Management Pharmacy Note  01/02/2022 Name:  Laurie [name] MRN:  [id number] DOB:  [birth year]  Summary: Patient has been under a lot of stress Pt is overdue for annual exam and routine screenings  Recommendations/Changes made from today's visit: -Recommended daily BP monitoring at home and bringing BP cuff to office to ensure accuracy -Requested repeat DEXA order  Plan: Scheduled CPE in January BP assessment in 2-3 weeks Follow up in 3 months  Subjective: Laurie [name] is an 66 y.o. year old female who is a primary patient of [name], NP.  The CCM team was consulted for assistance with disease management and care coordination needs.    Engaged with patient by telephone for follow up visit in response to provider referral for pharmacy case management and/or care coordination services.   Consent to Services:  The patient was given information about Chronic Care Management services, agreed to services, and gave verbal consent prior to initiation of services.  Please see initial visit note for detailed documentation.   Patient Care Team: [name], NP as PCP - General (Family Medicine) Burnell Blanks, MD as PCP - Cardiology (Cardiology) Viona Gilmore, [facility] as Pharmacist (Pharmacist)  Recent office visits: 10/09/2021 Betty Martinique MD - Patient was seen for Gingival and periodontal disease and a toothache. Started Augmentin and Lidocaine 2% solution. Follow up if symptoms worsen or fail to improve, for Teethache with PCP.   09/27/2021 [name] NP - Patient was seen for Encounter to establish care and additional concerns. No medication changes. Follow up for physical.    Recent consult visits: 12/19/21 Kristeen Miss, MD (neurosurgery & spine): Patient presented for follow up. Unable to access notes.  12/11/21 [name], NP (cardiology): Patient presented for CAD follow up. Prescribed nicotine patches.  10/16/2021 Kathlyn Sacramento MD  (cardiology) - Patient was seen for PAD (peripheral artery disease) and additional concerns. No medicaiton changes. Follow up as needed with Dr. Fletcher Anon.   10/12/2021 Vanetta Mulders MD (orthopedic) - Patient was seen for Fall, initial encounter and additional concerns. No medication changes. Follow up as needed.   09/25/2021 [name] NP (cardiology) - Patient was seen for Coronary artery disease involving native coronary artery of native heart without angina pectoris and additional concerns. Discontinued Guaifenesin, Menthol and Mupirocin. Follow up in 2 months.    Hospital visits: Patient was seen at [facility] ED on 12/01/2021 (1 hour) due to Puncture wound of left hand without foreign body, initial encounter.   New?Medications Started at [facility] Discharge:?? No new medications Medication Changes at Hospital Discharge: No medication changes Medications Discontinued at Hospital Discharge: No medications discontinued Medications that remain the same after Hospital Discharge:??  -All other medications will remain the same.   Objective:  Lab Results  Component Value Date   CREATININE 1.00 10/08/2021   BUN 24 10/08/2021   GFR 73.56 02/09/2021   GFRNONAA >60 03/29/2021   GFRAA 76 09/27/2019   NA 140 10/08/2021   K 4.7 10/08/2021   CALCIUM 9.2 10/08/2021   CO2 27 10/08/2021    Lab Results  Component Value Date/Time   HGBA1C 5.4 09/13/2018 03:11 AM   GFR 73.56 02/09/2021 09:01 AM   GFR 69.04 02/23/2020 05:01 PM   MICROALBUR 2.7 (H) 02/09/2021 09:01 AM    Last diabetic Eye exam: No results found for: "HMDIABEYEEXA"  Last diabetic Foot exam: No results found for: "HMDIABFOOTEX"   Lab Results  Component Value Date   CHOL 137 10/08/2021  HDL 39 (L) 10/08/2021   LDLCALC 60 10/08/2021   LDLDIRECT 123.0 03/14/2017   TRIG 236 (H) 10/08/2021   CHOLHDL 3.5 10/08/2021       Latest Ref Rng & Units 10/08/2021   12:00 AM 02/09/2021    9:01 AM 12/30/2020    9:00 PM   Hepatic Function  Total Protein 6.0 - 8.5 g/dL 6.7  7.2  7.4   Albumin 3.9 - 4.9 g/dL 4.3  4.1  4.3   AST 0 - 40 IU/L _0 ALT 0 - 32 IU/L _1 Alk Phosphatase 44 - 121 IU/L 116  111  107   Total Bilirubin 0.0 - 1.2 mg/dL <0.2  0.3  0.3     Lab Results  Component Value Date/Time   TSH 2.94 04/25/2021 11:51 AM   TSH 0.04 (L) 04/26/2020 09:49 AM   FREET4 0.69 04/25/2021 11:51 AM   FREET4 0.69 04/26/2020 09:49 AM       Latest Ref Rng & Units 03/29/2021    1:09 PM 12/30/2020    9:00 PM 02/23/2020    5:01 PM  CBC  WBC 4.0 - 10.5 K/uL 8.6  9.5  6.9   Hemoglobin 12.0 - 15.0 g/dL 12.0  12.9  11.9   Hematocrit 36.0 - 46.0 % 38.1  39.9  35.3   Platelets 150 - 400 K/uL 226  224  206.0     Lab Results  Component Value Date/Time   VD25OH 38.94 03/14/2017 09:48 AM   VD25OH 16.55 (L) 08/18/2015 08:36 AM    Clinical ASCVD: Yes  The 10-year ASCVD risk score (Arnett DK, et al., 2019) is: 15.4%   Values used to calculate the score:     Age: 66 years     Sex: Female     Is Non-Hispanic African American: No     Diabetic: No     Tobacco smoker: Yes     Systolic Blood Pressure: 751 mmHg     Is BP treated: Yes     HDL Cholesterol: 39 mg/dL     Total Cholesterol: 137 mg/dL       12/03/2021   10:38 AM 12/03/2021   10:37 AM 11/22/2021   10:29 AM  Depression screen PHQ 2/9  Decreased Interest 0 0 0  Down, Depressed, Hopeless _2 PHQ - 2 Score _3 Social History   Tobacco Use  Smoking Status Every Day   Packs/day: 0.50   Types: Cigarettes  Smokeless Tobacco Never  Tobacco Comments   trying to quit    BP Readings from Last 3 Encounters:  01/01/22 130/88  12/11/21 118/70  12/01/21 (!) 142/69   Pulse Readings from Last 3 Encounters:  01/01/22 70  12/11/21 67  12/01/21 71   Wt Readings from Last 3 Encounters:  01/01/22 174 lb (78.9 kg)  12/11/21 173 lb (78.5 kg)  10/16/21 173 lb 9.6 oz (78.7 kg)    Assessment/Interventions: Review of  patient past medical history, allergies, medications, health status, including review of consultants reports, laboratory and other test data, was performed as part of comprehensive evaluation and provision of chronic care management services.   SDOH:  (Social Determinants of Health) assessments and interventions performed: Yes (last 09/18/21) SDOH Interventions    Flowsheet Row Telephone from 12/03/2021 in [place] Telephone from 11/22/2021 in [place] Management from 09/18/2021  in [employer] at [employer] from 06/13/2021 in [place] at [place] Management from 11/24/2019 in [place] at [place] Interventions Intervention Not Indicated Intervention Not Indicated -- Intervention Not Indicated --  Housing Interventions Intervention Not Indicated Intervention Not Indicated -- Intervention Not Indicated --  Transportation Interventions Intervention Not Indicated Intervention Not Indicated -- Intervention Not Indicated Intervention Not Indicated  Utilities Interventions Intervention Not Indicated Intervention Not Indicated -- -- --  Financial Strain Interventions -- -- Intervention Not Indicated Intervention Not Indicated Intervention Not Indicated  Physical Activity Interventions -- -- -- Intervention Not Indicated --  Stress Interventions -- -- -- Intervention Not Indicated --  Social Connections Interventions -- -- -- Intervention Not Indicated --       CCM Care Plan  Allergies  Allergen Reactions   Codeine Nausea And Vomiting   Lisinopril Cough   Sulfonamide Derivatives Nausea And Vomiting   Tetracyclines & Related Rash    Medications Reviewed Today     Reviewed by [name], NP (Nurse Practitioner) on 01/01/22 at [place] List Status: <None>   Medication Order Taking? Sig  Documenting Provider Last Dose Status Informant  albuterol (VENTOLIN HFA) 108 (90 Base) MCG/ACT inhaler [id number] Yes INHALE TWO PUFFS BY MOUTH INTO LUNGS every SIX hours AS NEEDED FOR WHEEZING AND/OR SHORTNESS OF BREATH Nafziger, Tommi Rumps, NP Taking Active   amoxicillin-clavulanate (AUGMENTIN) 875-125 MG tablet [id number] Yes Take 1 tablet by mouth 2 (two) times daily. Nafziger, Tommi Rumps, NP  Active   aspirin EC 81 MG tablet [id number] Yes Take 1 tablet (81 mg total) by mouth daily. Swallow whole. Burnell Blanks, MD Taking Active Self  atorvastatin (LIPITOR) 40 MG tablet [id number] Yes Take 1 tablet (40 mg total) by mouth every evening. Burnell Blanks, MD Taking Active   Blood Pressure Monitoring DEVI [id number] Yes Use as needed DX I10 [name], MD Taking Active   clopidogrel (PLAVIX) 75 MG tablet [id number] Yes Take 1 tablet (75 mg total) by mouth daily. Burnell Blanks, MD Taking Active   fenofibrate (TRICOR) 145 MG tablet [id number] Yes Take 1 tablet (145 mg total) by mouth daily. Swinyer, Lanice Schwab, NP Taking Active   fluticasone [facility]) 50 MCG/ACT nasal spray [id number] Yes USE TWO SPRAYS in each nostril daily Burchette, Alinda Sierras, MD Taking Active   furosemide (LASIX) 20 MG tablet [id number] Yes TAKE ONE TABLET BY MOUTH daily AS NEEDED FOR LEG EDEMA Burchette, Alinda Sierras, MD Taking Active   lidocaine (XYLOCAINE) 2 % solution [id number] Yes Apply 5 ml  around gum of affected teeth up to 4 times per day. Martinique, Betty G, MD Taking Active   losartan (COZAAR) 50 MG tablet [id number] Yes TAKE ONE TABLET BY MOUTH EVERY MORNING Nafziger, Tommi Rumps, NP Taking Active   methimazole (TAPAZOLE) 10 MG tablet [id number] Yes TAKE TWO TABLETS BY MOUTH EVERY Ladora Daniel, MD Taking Active   metoprolol succinate (TOPROL-XL) 50 MG 24 hr tablet [id number] Yes Take 50 mg by mouth daily. Take with or immediately following a meal. [provider] Taking Active   nicotine (NICODERM CQ -  DOSED IN MG/24 HOURS) 14 mg/24hr patch [id number] Yes Use for the 2nd 28 days Swinyer, Lanice Schwab, NP Taking Active   nicotine (NICODERM CQ - DOSED IN MG/24 HOURS) 21 mg/24hr patch [id number] Yes Use for the first 28 days Swinyer, Lanice Schwab, NP Taking Active   nicotine (NICODERM CQ - DOSED  IN MG/24 HR) 7 mg/24hr patch [id number] Yes Use last Swinyer, Lanice Schwab, NP Taking Active   nitroGLYCERIN (NITROSTAT) 0.4 MG SL tablet [id number]  Place 1 tablet (0.4 mg total) under the tongue every 5 (five) minutes as needed for chest pain. Furth, Cadence H, PA-C  Expired 01/17/20 2359   ondansetron (ZOFRAN ODT) 8 MG disintegrating tablet [id number] Yes Take 1 tablet (8 mg total) by mouth every 8 (eight) hours as needed for nausea or vomiting. Swinyer, Lanice Schwab, NP Taking Active   oxyCODONE-acetaminophen (PERCOCET) 5-325 MG tablet [id number] Yes Take 1-2 tablets by mouth every 4 (four) hours as needed for moderate pain. Delora Fuel, MD Taking Active   pantoprazole (PROTONIX) 40 MG tablet [id number] Yes TAKE ONE TABLET BY MOUTH EVERY MORNING Nafziger, Tommi Rumps, NP Taking Active   venlafaxine XR (EFFEXOR-XR) 150 MG 24 hr capsule [id number] Yes TAKE ONE CAPSULE BY MOUTH EVERY MORNING Nafziger, Tommi Rumps, NP Taking Active             Patient Active Problem List   Diagnosis Date Noted   Fracture, lumbar vertebra, compression ([place]) 03/29/2021   Eye swelling 04/27/2020   Hyperthyroidism 02/19/2020   Coronary artery disease involving native coronary artery of native heart with unstable angina pectoris ([place])    Substance abuse ([place]) 07/23/2019   Renal insufficiency 09/10/2018   Elevated troponin 09/10/2018   Leukocytosis 09/10/2018   Hematemesis 09/09/2018   Opiate withdrawal ([place]) 09/09/2018   Vitamin D deficiency 06/10/2018   Shortness of breath 11/05/2017   Elevated brain natriuretic peptide (BNP) level 11/05/2017   Polysubstance (excluding opioids) dependence, daily use ([place]) 11/05/2017   AKI (acute kidney injury)  ([place]) 11/05/2017   Low serum vitamin D 08/23/2015   Hyperlipidemia 11/08/2014   Reflux esophagitis 03/23/2014   Dysphagia, pharyngoesophageal phase 03/23/2014   Pain in the chest 03/23/2014   Special screening for malignant neoplasms, colon 07/09/2013   Chest pain 04/23/2012   Dyspnea 04/23/2012   Murmur 04/23/2012   INTERSTITIAL CYSTITIS 01/01/2010   SWELLING, NECK 01/01/2010   CAD, NATIVE VESSEL 12/19/2009   Palpitations 11/21/2009   ABNORMAL CV (STRESS) TEST 11/21/2009   Essential hypertension 10/31/2009   ELECTROCARDIOGRAM, ABNORMAL 10/25/2009   PLANTAR FASCIITIS, BILATERAL 10/12/2009   CERVICAL RADICULOPATHY 07/04/2008   Pain in soft tissues of limb 03/25/2008   DEGENERATIVE DISC DISEASE, CERVICAL SPINE, W/RADICULOPATHY 09/08/2007   LOW BACK PAIN 09/08/2007   TOBACCO USE 03/23/2007   OSTEOARTHROSIS, LOCAL NOS, LOWER LEG 11/07/2006   STATE, SYMPTOMATIC MENOPAUSE/FEM CLIMACTERIC 10/27/2006   Depression, recurrent ([place]) 09/15/2006   Thoracic or lumbosacral neuritis or radiculitis, unspecified 09/15/2006   IBS 09/09/2006   STRICTURE, ESOPHAGEAL 03/16/2001    Immunization History  Administered Date(s) Administered   Fluad Quad(high Dose 65+) 11/08/2021   Influenza Split 12/16/2011   Influenza Whole 11/15/2008, 10/25/2009   Influenza,inj,Quad PF,6+ Mos 11/18/2012, 11/29/2013, 11/08/2014, 03/14/2017, 12/20/2020   Janssen (J&J) SARS-COV-2 Vaccination 06/12/2019   Td 10/25/2009   Tdap 12/01/2021   Patient reports she has been very busy lately with doctor's appointments. She was unaware she had a GI appt tomorrow and kept forgetting to call about scheduling her colonoscopy. She also was unaware that she was supposed to do lab work with cardiology to recheck her cholesterol.  Patient is checking BP once a day at home. She saw 185/70 yesterday and she was really lightheaded. She had her first reading and she got an arm cuff. Patient is not resting before checking and takes both BP  medications in the morning.  She drinks caffeine all throughout the day and she does feels lightheaded.   Conditions to be addressed/monitored:  Hypertension, Hyperlipidemia, Coronary Artery Disease, Depression, Tobacco use, Allergic Rhinitis and chronic pain  Conditions addressed this visit: Hypertension, depression  Care Plan : CCM Pharmacy Care Plan  Updates made by Viona Gilmore, [place] since 01/02/2022 12:00 AM     Problem: Problem: Hypertension, Hyperlipidemia, Coronary Artery Disease, Depression, Tobacco use, Allergic Rhinitis and chronic pain      Long-Range Goal: Patient-Specific Goal   Start Date: 04/10/2020  Expected End Date: 04/10/2021  Recent Progress: On track  Priority: High  Note:   Current Barriers:  Unable to independently monitor therapeutic efficacy Unable to achieve control of blood pressure  Unable to self administer medications as prescribed  Pharmacist Clinical Goal(s):  Patient will achieve control of blood pressure as evidenced by home blood pressure readings adhere to plan to optimize therapeutic regimen for blood thinners as evidenced by report of adherence to recommended medication management changes through collaboration with PharmD and provider.   Interventions: 1:1 collaboration with [name], NP regarding development and update of comprehensive plan of care as evidenced by provider attestation and co-signature Inter-disciplinary care team collaboration (see longitudinal plan of care) Comprehensive medication review performed; medication list updated in electronic medical record  Hypertension (BP goal <130/80) -Uncontrolled -Current treatment: Metoprolol succinate 50 mg 1 tablet daily - Appropriate, Query effective, Safe, Accessible Losartan 50 mg 1 tablet daily - Appropriate, Query effective, Safe, Accessible -Medications previously tried: none  -Current home readings: could not provide as patient does not have a BP cuff -Current dietary  habits: did not discuss -Current exercise habits: did not discuss -Denies hypotensive/hypertensive symptoms -Educated on Importance of home blood pressure monitoring; Proper BP monitoring technique; -Counseled to monitor BP at home twice weekly, document, and provide log at future appointments -Recommended to continue current medication Recommended checking BP daily.  Hyperlipidemia: (LDL goal < 70) -Uncontrolled -Current treatment: Atorvastatin 40 mg 1 tablet daily - Appropriate, Query effective, Safe, Accessible Fenofibrate 145 mg 1 tablet daily - Query Appropriate, Query effective, Safe, Accessible -Medications previously tried: none  -Current dietary patterns: did not discuss -Current exercise habits: did not discuss -Educated on Cholesterol goals;  Importance of limiting foods high in cholesterol; -Recommended to continue current medication  Depression (Goal: minimize symptoms) -Controlled -Current treatment: Venlafaxine XR 150 mg 1 capsule in the morning with breakfast - Appropriate, Effective, Safe, Accessible -Medications previously tried/failed:  Xanax, Elavil, Wellbutrin, Citalopram, Pristiq, Valium, Cymbalta  -PHQ9: 19 (11/24/19) -Educated on Benefits of medication for symptom control Benefits of cognitive-behavioral therapy with or without medication -Recommended to continue current medication Provided behavioral health phone number [phone]) to set up an appointment.  Coronary artery disease (Goal: prevent heart events) -Controlled -Current treatment  Aspirin 81 mg daily - Appropriate, Effective, Safe, Accessible Clopidogrel 75 mg daily - Appropriate, Effective, Safe, Accessible -Medications previously tried: Brilinta (shortness of breath)  -Recommended to continue current medication  Tobacco use (Goal quit smoking) -Uncontrolled -Previous quit attempts: Wellbutrin -Current treatment  No medications -Patient smokes After 30 minutes of waking -Patient  triggers include: stress -On a scale of 1-10, reports MOTIVATION to quit is 8 -On a scale of 1-10, reports CONFIDENCE in quitting is 5 - Plan to reassess willingness to quit at follow up  -Counseled on benefits of smoking cessation.  GERD (Goal: minimize symptoms of heartburn or acid reflux) -Controlled -Current treatment  Pantoprazole 40 mg daily - Appropriate, Effective, Safe, Accessible -  Medications previously tried: none  -Recommended to continue current medication Counseled on non-pharmacologic management of symptoms such as elevating the head of your bed, avoiding eating 2-3 hours before bed, avoiding triggering foods such as acidic, spicy, or fatty foods, eating smaller meals, and wearing clothes that are loose around the waist  Allergic rhinitis (Goal: minimize symptoms of allergies) -Controlled -Current treatment  Mucinex 600 mg twice daily as needed - Appropriate, Effective, Safe, Accessible Flonase 50 mcg/act 2 spray daily as needed - Appropriate, Effective, Safe, Accessible -Medications previously tried: none -Recommended to continue current medication  Hyperthryroidism (Goal: TSH 0.35-4.5) -Controlled -Current treatment  Methimazole 10 mg 2 tablets by mouth daily - Appropriate, Effective, Safe, Accessible -Medications previously tried: none  -Recommended to continue current medication  Lower extremity swelling (Goal: minimize fluid retention) -Controlled -Current treatment  Furosemide 20 mg 1 tablet daily as needed - Appropriate, Effective, Safe, Accessible -Medications previously tried: none  -Recommended to continue current medication Recommended weighing herself to assess for fluid changes.  Health Maintenance -Vaccine gaps: shingles, tetanus, COVID booster, Prevnar 20 -Current therapy:  BioFreeze Ex daily as needed Nitroglycerin 0.4 mg as needed -Educated on Cost vs benefit of each product must be carefully weighed by individual consumer -Patient is  satisfied with current therapy and denies issues -Recommended to continue current medication  Patient Goals/Self-Care Activities Patient will:  - take medications as prescribed focus on medication adherence by setting an alarm to remember to take medications on time check blood pressure twice weekly, document, and provide at future appointments  Follow Up Plan: The care management team will reach out to the patient again over the next 14 days.         Medication Assistance: None required.  Patient affirms current coverage meets needs.  Compliance/Adherence/Medication fill history: Care Gaps: Shingrix, DEXA, colonoscopy, COVID booster, Prevnar20 Last BP - 118/70 on 12/11/2021 Last A1C - 5.4 on 09/13/2018  [facility]: Atorvastatin 40 mg - last filled 40 mg 12/17/2021 30 DS at [facility] Losartan  50 mg - last filled 50 mg 12/17/2021 30 DS at [facility]  Patient's preferred pharmacy is:  [facility] - [place], [place] - [address] [place] [address] Phone: [phone] Fax: [phone]  [facility] [id number], [place]. [place] [address] Phone: [phone] Fax: [phone]   Uses pill box? No - adherence packaging Pt endorses 80% compliance - has not been taking clopidogrel x 2 weeks  We discussed: Benefits of medication synchronization, packaging and delivery as well as enhanced pharmacist oversight with [facility]. Patient decided to: Utilize [facility] pharmacy for medication synchronization, packaging and delivery  Care Plan and Follow Up Patient Decision:  Patient agrees to Care Plan and Follow-up.  Plan: The care management team will reach out to the patient again over the next 14 days.  Jeni Salles, PharmD [facility] Clinical Pharmacist [place] [facility]

## 2022-01-01 NOTE — Progress Notes (Signed)
Subjective:    Patient ID: Laurie [name], female    DOB: 09-10-1955, 66 y.o.   MRN: [id number]  HPI 66 year old female who  has a past medical history of Allergy, Anxiety, Arthritis, Asthma, Chronic back pain, COPD (chronic obstructive pulmonary disease) ([place]), Depression, Esophageal stricture, GERD (gastroesophageal reflux disease), Heart murmur, Hematemesis (09/10/2018), Hyperlipidemia, Hypertension, IBS (irritable bowel syndrome), Interstitial cystitis, Neuromuscular disorder ([place]), and PONV (postoperative nausea and vomiting).  She is being evaluated today for concern of dental abscess.  She has poor dentition to begin with, reports that she has had some mild pain, foul taste in her mouth, and does notice pus at her gumline.  She has not found a dentist due to insurance reasons.  She reports that she actually pulled 2 teeth out on her own recently due to tooth pain.  She also filed down 1 tooth with a nail file so that it would not rub up against her tongue.  Review of Systems See HPI  Past Medical History:  Diagnosis Date   Allergy    Anxiety    Arthritis    RA   Asthma    Chronic back pain    COPD (chronic obstructive pulmonary disease) (HCC)    Depression    Esophageal stricture    GERD (gastroesophageal reflux disease)    Heart murmur    Hematemesis 09/10/2018   Hyperlipidemia    Hypertension    IBS (irritable bowel syndrome)    Interstitial cystitis    Neuromuscular disorder (HCC)    fibromyalgia   PONV (postoperative nausea and vomiting)     Social History   Socioeconomic History   Marital status: Single    Spouse name: Not on file   Number of children: Not on file   Years of education: Not on file   Highest education level: Not on file  Occupational History   Occupation: disability  Tobacco Use   Smoking status: Every Day    Packs/day: 0.50    Types: Cigarettes   Smokeless tobacco: Never   Tobacco comments:    trying to quit   Vaping Use   Vaping  Use: Never used  Substance and Sexual Activity   Alcohol use: Never    Comment: occasional   Drug use: Yes    Types: Marijuana    Comment: occas   Sexual activity: Not on file  Other Topics Concern   Not on file  Social History Narrative   Not on file   Social Determinants of Health   Financial Resource Strain: Low Risk  (09/21/2021)   Overall Financial Resource Strain (CARDIA)    Difficulty of Paying Living Expenses: Not very hard  Food Insecurity: No Food Insecurity (12/03/2021)   Hunger Vital Sign    Worried About Running Out of Food in the Last Year: Never true    Ran Out of Food in the Last Year: Never true  Transportation Needs: No Transportation Needs (12/03/2021)   PRAPARE - Hydrologist (Medical): No    Lack of Transportation (Non-Medical): No  Physical Activity: Insufficiently Active (06/13/2021)   Exercise Vital Sign    Days of Exercise per Week: 3 days    Minutes of Exercise per Session: 10 min  Stress: No Stress Concern Present (06/13/2021)   [place]    Feeling of Stress : Only a little  Social Connections: Moderately Integrated (06/13/2021)   Social Connection and Isolation  Panel [NHANES]    Frequency of Communication with Friends and Family: More than three times a week    Frequency of Social Gatherings with Friends and Family: More than three times a week    Attends Religious Services: More than 4 times per year    Active Member of Clubs or Organizations: Yes    Attends Archivist Meetings: More than 4 times per year    Marital Status: Divorced  Intimate Partner Violence: Not At Risk (11/22/2021)   Humiliation, Afraid, Rape, and Kick questionnaire    Fear of Current or Ex-Partner: No    Emotionally Abused: No    Physically Abused: No    Sexually Abused: No    Past Surgical History:  Procedure Laterality Date   ABDOMINAL HYSTERECTOMY     APPENDECTOMY      BIOPSY  09/11/2018   Procedure: BIOPSY;  Surgeon: Rush Landmark, Telford Nab., MD;  Location: [facility];  Service: Gastroenterology;;   CERVICAL FUSION     CERVICAL LAMINECTOMY     CORONARY STENT INTERVENTION N/A 10/07/2019   Procedure: CORONARY STENT INTERVENTION;  Surgeon: Burnell Blanks, MD;  Location: [facility];  Service: Cardiovascular;  Laterality: N/A;   ESOPHAGOGASTRODUODENOSCOPY     ESOPHAGOGASTRODUODENOSCOPY (EGD) WITH PROPOFOL N/A 09/11/2018   Procedure: ESOPHAGOGASTRODUODENOSCOPY (EGD) WITH PROPOFOL;  Surgeon: Rush Landmark Telford Nab., MD;  Location: [facility];  Service: Gastroenterology;  Laterality: N/A;   FINGER SURGERY     KYPHOPLASTY N/A 03/29/2021   Procedure: Lumbar Three KYPHOPLASTY;  Surgeon: Kristeen Miss, MD;  Location: [facility];  Service: Neurosurgery;  Laterality: N/A;   LEFT HEART CATH AND CORONARY ANGIOGRAPHY N/A 10/07/2019   Procedure: LEFT HEART CATH AND CORONARY ANGIOGRAPHY;  Surgeon: Burnell Blanks, MD;  Location: [facility];  Service: Cardiovascular;  Laterality: N/A;   LUMBAR FUSION     SAVORY DILATION N/A 09/11/2018   Procedure: SAVORY DILATION;  Surgeon: Rush Landmark Telford Nab., MD;  Location: [facility];  Service: Gastroenterology;  Laterality: N/A;   TONSILLECTOMY      Family History  Problem Relation Age of Onset   Dementia Mother    Heart attack Father    Coronary artery disease Father    Cancer Brother        esophageal   Esophageal cancer Brother    Coronary artery disease Paternal Aunt    Stomach cancer Paternal Aunt    Coronary artery disease Paternal Grandmother    Aneurysm Brother        aortic   Rectal cancer Neg Hx    Colon cancer Neg Hx    Thyroid disease Neg Hx     Allergies  Allergen Reactions   Codeine Nausea And Vomiting   Lisinopril Cough   Sulfonamide Derivatives Nausea And Vomiting   Tetracyclines & Related Rash    Current Outpatient Medications on File Prior to Visit  Medication Sig  Dispense Refill   albuterol (VENTOLIN HFA) 108 (90 Base) MCG/ACT inhaler INHALE TWO PUFFS BY MOUTH INTO LUNGS every SIX hours AS NEEDED FOR WHEEZING AND/OR SHORTNESS OF BREATH 8.5 g 3   aspirin EC 81 MG tablet Take 1 tablet (81 mg total) by mouth daily. Swallow whole. 90 tablet 3   atorvastatin (LIPITOR) 40 MG tablet Take 1 tablet (40 mg total) by mouth every evening. 90 tablet 3   Blood Pressure Monitoring DEVI Use as needed DX I10 1 each 1   clopidogrel (PLAVIX) 75 MG tablet Take 1 tablet (75 mg total) by  mouth daily. 90 tablet 3   fenofibrate (TRICOR) 145 MG tablet Take 1 tablet (145 mg total) by mouth daily. 90 tablet 3   fluticasone (FLONASE) 50 MCG/ACT nasal spray USE TWO SPRAYS in each nostril daily 16 g 6   furosemide (LASIX) 20 MG tablet TAKE ONE TABLET BY MOUTH daily AS NEEDED FOR LEG EDEMA 30 tablet 3   lidocaine (XYLOCAINE) 2 % solution Apply 5 ml  around gum of affected teeth up to 4 times per day. 100 mL 0   losartan (COZAAR) 50 MG tablet TAKE ONE TABLET BY MOUTH EVERY MORNING 90 tablet 1   methimazole (TAPAZOLE) 10 MG tablet TAKE TWO TABLETS BY MOUTH EVERY EVENING 30 tablet 0   metoprolol succinate (TOPROL-XL) 50 MG 24 hr tablet Take 50 mg by mouth daily. Take with or immediately following a meal.     nicotine (NICODERM CQ - DOSED IN MG/24 HOURS) 14 mg/24hr patch Use for the 2nd 28 days 28 patch 0   nicotine (NICODERM CQ - DOSED IN MG/24 HOURS) 21 mg/24hr patch Use for the first 28 days 28 patch 0   nicotine (NICODERM CQ - DOSED IN MG/24 HR) 7 mg/24hr patch Use last 28 patch 0   ondansetron (ZOFRAN ODT) 8 MG disintegrating tablet Take 1 tablet (8 mg total) by mouth every 8 (eight) hours as needed for nausea or vomiting. 15 tablet 0   oxyCODONE-acetaminophen (PERCOCET) 5-325 MG tablet Take 1-2 tablets by mouth every 4 (four) hours as needed for moderate pain. 20 tablet 0   pantoprazole (PROTONIX) 40 MG tablet TAKE ONE TABLET BY MOUTH EVERY MORNING 90 tablet 0   venlafaxine XR  (EFFEXOR-XR) 150 MG 24 hr capsule TAKE ONE CAPSULE BY MOUTH EVERY MORNING 90 capsule 1   nitroGLYCERIN (NITROSTAT) 0.4 MG SL tablet Place 1 tablet (0.4 mg total) under the tongue every 5 (five) minutes as needed for chest pain. 25 tablet 1   No current facility-administered medications on file prior to visit.    BP 130/88   Pulse 70   Temp 97.8 F (36.6 C) (Oral)   Ht '5\' 9"'$  (1.753 m)   Wt 174 lb (78.9 kg)   SpO2 96%   BMI 25.70 kg/m       Objective:   Physical Exam Vitals and nursing note reviewed.  Constitutional:      Appearance: Normal appearance.  HENT:     Mouth/Throat:     Mouth: Oral lesions present.     Dentition: Abnormal dentition. Dental abscesses present.     Tongue: Lesions present.     Comments: Cancker sore on underside of tongue, right side.   Partially retained molar on left lower from where she attempted to remove her tooth  Cardiovascular:     Rate and Rhythm: Normal rate and regular rhythm.     Pulses: Normal pulses.     Heart sounds: Normal heart sounds.  Pulmonary:     Breath sounds: Normal breath sounds.  Musculoskeletal:        General: Normal range of motion.  Skin:    General: Skin is warm and dry.  Neurological:     General: No focal deficit present.     Mental Status: She is alert and oriented to person, place, and time.  Psychiatric:        Mood and Affect: Mood normal.        Thought Content: Thought content normal.        Judgment: Judgment normal.  Assessment & Plan:  1. Gingival and periodontal disease - advised not pulling her own teeth or doing any type of dental procedure at home.  - Information given for [facility]  - amoxicillin-clavulanate (AUGMENTIN) 875-125 MG tablet; Take 1 tablet by mouth 2 (two) times daily.  Dispense: 20 tablet; Refill: 0  [name], NP

## 2022-01-01 NOTE — Patient Instructions (Signed)
[facility]  Address: [address], [place], [place] [address] Phone: [phone]

## 2022-01-02 ENCOUNTER — Other Ambulatory Visit: Payer: Medicare Other

## 2022-01-02 ENCOUNTER — Ambulatory Visit: Payer: Medicare Other | Admitting: Physician Assistant

## 2022-01-02 DIAGNOSIS — M5416 Radiculopathy, lumbar region: Secondary | ICD-10-CM | POA: Diagnosis not present

## 2022-01-02 DIAGNOSIS — M5117 Intervertebral disc disorders with radiculopathy, lumbosacral region: Secondary | ICD-10-CM | POA: Diagnosis not present

## 2022-01-02 DIAGNOSIS — M48061 Spinal stenosis, lumbar region without neurogenic claudication: Secondary | ICD-10-CM | POA: Diagnosis not present

## 2022-01-08 ENCOUNTER — Other Ambulatory Visit: Payer: Self-pay | Admitting: Adult Health

## 2022-01-08 DIAGNOSIS — Z13820 Encounter for screening for osteoporosis: Secondary | ICD-10-CM

## 2022-01-09 ENCOUNTER — Telehealth: Payer: Self-pay | Admitting: Pharmacist

## 2022-01-09 DIAGNOSIS — M5416 Radiculopathy, lumbar region: Secondary | ICD-10-CM | POA: Diagnosis not present

## 2022-01-09 NOTE — Chronic Care Management (AMB) (Signed)
Chronic Care Management Pharmacy Assistant   Name: Laurie [name]  MRN: [id number] DOB: 11/22/1955  Reason for Encounter: Medication Review / Medication Coordination Call   Recent office visits:  None  Recent consult visits:  None  Hospital visits:  None  Medications: Outpatient Encounter Medications as of 01/09/2022  Medication Sig   albuterol (VENTOLIN HFA) 108 (90 Base) MCG/ACT inhaler INHALE TWO PUFFS BY MOUTH INTO LUNGS every SIX hours AS NEEDED FOR WHEEZING AND/OR SHORTNESS OF BREATH   amoxicillin-clavulanate (AUGMENTIN) 875-125 MG tablet Take 1 tablet by mouth 2 (two) times daily.   aspirin EC 81 MG tablet Take 1 tablet (81 mg total) by mouth daily. Swallow whole.   atorvastatin (LIPITOR) 40 MG tablet Take 1 tablet (40 mg total) by mouth every evening.   Blood Pressure Monitoring DEVI Use as needed DX I10   clopidogrel (PLAVIX) 75 MG tablet Take 1 tablet (75 mg total) by mouth daily.   fenofibrate (TRICOR) 145 MG tablet Take 1 tablet (145 mg total) by mouth daily.   fluticasone (FLONASE) 50 MCG/ACT nasal spray USE TWO SPRAYS in each nostril daily   furosemide (LASIX) 20 MG tablet TAKE ONE TABLET BY MOUTH daily AS NEEDED FOR LEG EDEMA   lidocaine (XYLOCAINE) 2 % solution Apply 5 ml  around gum of affected teeth up to 4 times per day.   losartan (COZAAR) 50 MG tablet TAKE ONE TABLET BY MOUTH EVERY MORNING   methimazole (TAPAZOLE) 10 MG tablet TAKE TWO TABLETS BY MOUTH EVERY EVENING   metoprolol succinate (TOPROL-XL) 50 MG 24 hr tablet Take 50 mg by mouth daily. Take with or immediately following a meal.   nicotine (NICODERM CQ - DOSED IN MG/24 HOURS) 14 mg/24hr patch Use for the 2nd 28 days   nicotine (NICODERM CQ - DOSED IN MG/24 HOURS) 21 mg/24hr patch Use for the first 28 days   nicotine (NICODERM CQ - DOSED IN MG/24 HR) 7 mg/24hr patch Use last   nitroGLYCERIN (NITROSTAT) 0.4 MG SL tablet Place 1 tablet (0.4 mg total) under the tongue every 5 (five) minutes as  needed for chest pain.   ondansetron (ZOFRAN ODT) 8 MG disintegrating tablet Take 1 tablet (8 mg total) by mouth every 8 (eight) hours as needed for nausea or vomiting.   oxyCODONE-acetaminophen (PERCOCET) 5-325 MG tablet Take 1-2 tablets by mouth every 4 (four) hours as needed for moderate pain.   pantoprazole (PROTONIX) 40 MG tablet TAKE ONE TABLET BY MOUTH EVERY MORNING   venlafaxine XR (EFFEXOR-XR) 150 MG 24 hr capsule TAKE ONE CAPSULE BY MOUTH EVERY MORNING   No facility-administered encounter medications on file as of 01/09/2022.   Reviewed chart for medication changes ahead of medication coordination call. BP Readings from Last 3 Encounters:  01/01/22 130/88  12/11/21 118/70  12/01/21 (!) 142/69    Lab Results  Component Value Date   HGBA1C 5.4 09/13/2018     Patient obtains medications through Adherence Packaging  30 Days    Last adherence delivery included:  Aspirin EC 81 mg: one tablet at dinner Furosemide 20 mg 1 tablet daily as needed Venlafaxine XR 150 mg: one capsule with breakfast Metoprolol succinate 50 MG 24 hr: one tablet at breakfast Losartan 50 mg: one tablet with breakfast Fluticasone 50 MCG/ACT nasal spray: Place 2 sprays into both nostrils daily Albuterol HFA : 2 puffs every 6 hours as needed Methimazole 10 mg: two tablets at dinner  Pantoprazole 40 mg: one tablet at breakfast Clopidogrel 75 mg: one  tablet at breakfast  Atorvastatin 40 mg: one tablet at dinner Fenofibrate 145 mg: one tablet at dinner Ondansetron 8 mg: 1 tablet every 8 hours as needed for nausea or vomiting   Patient declined (meds) last month:  Unable to reach patient     Patient is due for next adherence delivery on: 01/21/2022   Called patient and reviewed medications and coordinated delivery.   This delivery to include: Aspirin EC 81 mg: one tablet at dinner Furosemide 20 mg 1 tablet daily as needed Venlafaxine XR 150 mg: one capsule with breakfast Metoprolol succinate 50 MG 24  hr: one tablet at breakfast Losartan 50 mg: one tablet with breakfast Fluticasone 50 MCG/ACT nasal spray: Place 2 sprays into both nostrils daily Albuterol HFA : 2 puffs every 6 hours as needed Methimazole 10 mg: two tablets at dinner  Pantoprazole 40 mg: one tablet at breakfast Clopidogrel 75 mg: one tablet at breakfast  Atorvastatin 40 mg: one tablet at dinner Fenofibrate 145 mg: one tablet at dinner Ondansetron 8 mg: 1 tablet every 8 hours as needed for nausea or vomiting   Patient will need a short fill: No short fills   Coordinated acute fill: No acute fills   Patient declined the following medications: No medications declined  Confirmed delivery date of 01/21/2022, advised patient that pharmacy will contact them the morning of delivery.   Care Gaps: AWV - scheduled 06/17/2022 Last BP - 130/88 on 01/01/2022 Last A1C - 5.4 on 09/13/2018 Shingrix  - never done Covid - overdue Pneumonia - postponed Dexascan - postponed Colonoscopy - postponed  Star Rating Drugs: Atorvastatin 40 mg - last filled 12/17/2021 30 DS at [facility] Losartan  50 mg - last filled 12/17/2021 30 DS at [facility] [phone]

## 2022-01-10 DIAGNOSIS — I25110 Atherosclerotic heart disease of native coronary artery with unstable angina pectoris: Secondary | ICD-10-CM

## 2022-01-10 DIAGNOSIS — I10 Essential (primary) hypertension: Secondary | ICD-10-CM

## 2022-01-14 ENCOUNTER — Other Ambulatory Visit: Payer: Self-pay | Admitting: Internal Medicine

## 2022-01-14 ENCOUNTER — Other Ambulatory Visit: Payer: Self-pay | Admitting: Family Medicine

## 2022-01-17 ENCOUNTER — Other Ambulatory Visit: Payer: Self-pay | Admitting: Internal Medicine

## 2022-01-17 ENCOUNTER — Other Ambulatory Visit: Payer: Self-pay | Admitting: Nurse Practitioner

## 2022-01-17 NOTE — Telephone Encounter (Signed)
Rx refill sent to pharmacy. 

## 2022-01-18 ENCOUNTER — Telehealth: Payer: Self-pay | Admitting: Pharmacist

## 2022-01-18 NOTE — Chronic Care Management (AMB) (Unsigned)
    Chronic Care Management Pharmacy Assistant   Name: Laurie [name]  MRN: [id number] DOB: 12-21-1955  Reason for Encounter: Follow up recent blood pressures   Has patient been checking blood pressures daily? What are current readings:  Have they continued to be elevated? Symptoms when elevated?  If bp is still elevated offer ov (maddie) to compare her bp cuff with office cuff  [address] Pharmacist Assistant [phone]

## 2022-02-07 ENCOUNTER — Telehealth: Payer: Self-pay | Admitting: Adult Health

## 2022-02-07 ENCOUNTER — Other Ambulatory Visit: Payer: Self-pay | Admitting: Family Medicine

## 2022-02-07 ENCOUNTER — Other Ambulatory Visit: Payer: Self-pay | Admitting: Nurse Practitioner

## 2022-02-07 ENCOUNTER — Other Ambulatory Visit: Payer: Self-pay | Admitting: Adult Health

## 2022-02-07 ENCOUNTER — Telehealth: Payer: Self-pay | Admitting: Pharmacist

## 2022-02-07 DIAGNOSIS — K056 Periodontal disease, unspecified: Secondary | ICD-10-CM

## 2022-02-07 NOTE — Telephone Encounter (Signed)
Pt will need an appt. For this

## 2022-02-07 NOTE — Chronic Care Management (AMB) (Addendum)
Chronic Care Management Pharmacy Assistant   Name: Laurie [name]  MRN: [id number] DOB: 09/02/1955  Reason for Encounter: Medication Review / Medication Coordination Call   Recent office visits:  None  Recent consult visits:  None  Hospital visits:  None  Medications: Outpatient Encounter Medications as of 02/07/2022  Medication Sig   albuterol (VENTOLIN HFA) 108 (90 Base) MCG/ACT inhaler INHALE TWO PUFFS BY MOUTH INTO LUNGS every SIX hours AS NEEDED FOR WHEEZING AND/OR SHORTNESS OF BREATH   amoxicillin-clavulanate (AUGMENTIN) 875-125 MG tablet Take 1 tablet by mouth 2 (two) times daily.   aspirin EC 81 MG tablet Take 1 tablet (81 mg total) by mouth daily. Swallow whole.   atorvastatin (LIPITOR) 40 MG tablet Take 1 tablet (40 mg total) by mouth every evening.   Blood Pressure Monitoring DEVI Use as needed DX I10   clopidogrel (PLAVIX) 75 MG tablet Take 1 tablet (75 mg total) by mouth daily.   fenofibrate (TRICOR) 145 MG tablet Take 1 tablet (145 mg total) by mouth daily.   fluticasone (FLONASE) 50 MCG/ACT nasal spray instill two SPRAYS in each nostril daily   furosemide (LASIX) 20 MG tablet TAKE ONE TABLET BY MOUTH ONCE daily AS NEEDED FOR LEG EDEMA   lidocaine (XYLOCAINE) 2 % solution Apply 5 ml  around gum of affected teeth up to 4 times per day.   losartan (COZAAR) 50 MG tablet TAKE ONE TABLET BY MOUTH EVERY MORNING   methimazole (TAPAZOLE) 10 MG tablet TAKE TWO TABLETS BY MOUTH EVERY EVENING   metoprolol succinate (TOPROL-XL) 50 MG 24 hr tablet Take 50 mg by mouth daily. Take with or immediately following a meal.   nicotine (NICODERM CQ - DOSED IN MG/24 HOURS) 14 mg/24hr patch Use for the 2nd 28 days   nicotine (NICODERM CQ - DOSED IN MG/24 HOURS) 21 mg/24hr patch Use for the first 28 days   nicotine (NICODERM CQ - DOSED IN MG/24 HR) 7 mg/24hr patch Use last   nitroGLYCERIN (NITROSTAT) 0.4 MG SL tablet Place 1 tablet (0.4 mg total) under the tongue every 5 (five) minutes  as needed for chest pain.   ondansetron (ZOFRAN-ODT) 8 MG disintegrating tablet TAKE ONE TABLET BY MOUTH every EIGHT hours AS NEEDED FOR NAUSEA AND VOMITING   oxyCODONE-acetaminophen (PERCOCET) 5-325 MG tablet Take 1-2 tablets by mouth every 4 (four) hours as needed for moderate pain.   pantoprazole (PROTONIX) 40 MG tablet TAKE ONE TABLET BY MOUTH EVERY MORNING   venlafaxine XR (EFFEXOR-XR) 150 MG 24 hr capsule TAKE ONE CAPSULE BY MOUTH EVERY MORNING   No facility-administered encounter medications on file as of 02/07/2022.   Reviewed chart for medication changes ahead of medication coordination call.  BP Readings from Last 3 Encounters:  01/01/22 130/88  12/11/21 118/70  12/01/21 (!) 142/69    Lab Results  Component Value Date   HGBA1C 5.4 09/13/2018     Patient obtains medications through Adherence Packaging  30 Days    Last adherence delivery included:  Aspirin EC 81 mg: one tablet at dinner Furosemide 20 mg 1 tablet daily as needed Venlafaxine XR 150 mg: one capsule with breakfast Metoprolol succinate 50 MG 24 hr: one tablet at breakfast Losartan 50 mg: one tablet with breakfast Fluticasone 50 MCG/ACT nasal spray: Place 2 sprays into both nostrils daily Albuterol HFA : 2 puffs every 6 hours as needed Methimazole 10 mg: two tablets at dinner  Pantoprazole 40 mg: one tablet at breakfast Clopidogrel 75 mg: one tablet at breakfast  Atorvastatin 40 mg: one tablet at dinner Fenofibrate 145 mg: one tablet at dinner Ondansetron 8 mg: 1 tablet every 8 hours as needed for nausea or vomiting   Patient declined (meds) last month:  Unable to reach patient     Patient is due for next adherence delivery on: 02/19/2021   Called patient and reviewed medications and coordinated delivery.   This delivery to include: Aspirin EC 81 mg: one tablet at dinner Furosemide 20 mg 1 tablet daily as needed Venlafaxine XR 150 mg: one capsule with breakfast Metoprolol succinate 50 MG 24 hr: one  tablet at breakfast Losartan 50 mg: one tablet with breakfast Fluticasone 50 MCG/ACT nasal spray: Place 2 sprays into both nostrils daily Albuterol HFA : 2 puffs every 6 hours as needed Methimazole 10 mg: two tablets at dinner  Pantoprazole 40 mg: one tablet at breakfast Clopidogrel 75 mg: one tablet at breakfast  Atorvastatin 40 mg: one tablet at dinner Fenofibrate 145 mg: one tablet at dinner Ondansetron 8 mg: 1 tablet every 8 hours as needed for nausea or vomiting    Patient declined the following medications: No medications declined  Confirmed delivery date of 02/19/2021, advised patient that pharmacy will contact them the morning of delivery.  Follow up recent blood pressure readings Has patient been checking blood pressures daily?  Patient states she has not been checking her blood pressures at home and denies any recent symptoms.  Care Gaps: AWV - scheduled 06/17/2022 Last BP - 130/88 on 01/01/2022 Last A1C - 5.4 on 09/13/2018 Shingrix - never done Covid - overdue Pneumovax - postponed Dexa - postponed Colonoxdcopy - postponed  Star Rating Drugs: Atorvastatin 40 mg - last filled 01/15/2022 30 DS at [facility] Losartan  50 mg - last filled 01/15/2022 30 DS at [facility] [phone]

## 2022-02-07 NOTE — Telephone Encounter (Signed)
Pt is calling and would like another refill amoxicillin-clavulanate (AUGMENTIN) 875-125 MG tablet on she is unable to come in due to her mom is in hospice. Pt will go to dental after round of abx  [facility] [id number] - [place], [address] Phone: [phone]  Fax: [phone]

## 2022-02-08 MED ORDER — AMOXICILLIN-POT CLAVULANATE 875-125 MG PO TABS: 1.0000 | ORAL_TABLET | Freq: Two times a day (BID) | ORAL | 0 refills | Status: DC

## 2022-02-08 NOTE — Addendum Note (Signed)
Addended by: Elza Rafter D on: 02/08/2022 01:41 PM   Modules accepted: Orders

## 2022-02-08 NOTE — Telephone Encounter (Signed)
Pt states her teeth are getting worse; that there's an exposed nerve. Pt states she didn't go to referred dentist. Advised pt to visit dentist; that problem will persist until she has her teeth assessed. Also informed that PCP is out of office at this time & will be unable to prescribe her the requested medication. Also advised that if abx didn't work the 1st the same abx isn't going to work this time. Pt verb understanding.  Will route to in office provider for review while PCP is out of office.

## 2022-02-08 NOTE — Telephone Encounter (Signed)
[name], MD  to Me     02/08/22 12:20 PM Okay to refill Augmentin once but agree that she does need to see dentist soon within the next couple of weeks  Pt notified of provider response. She is appreciative. Verified that she wanted medication sent to [facility] pharmacy on file.

## 2022-02-12 ENCOUNTER — Other Ambulatory Visit: Payer: Self-pay | Admitting: Adult Health

## 2022-02-12 MED ORDER — ONDANSETRON 8 MG PO TBDP: ORAL_TABLET | ORAL | 2 refills | Status: DC

## 2022-02-13 ENCOUNTER — Ambulatory Visit (INDEPENDENT_AMBULATORY_CARE_PROVIDER_SITE_OTHER): Payer: Medicare Other | Admitting: Adult Health

## 2022-02-13 VITALS — BP 132/82 | HR 60 | Temp 98.2°F | Ht 69.0 in | Wt 171.0 lb

## 2022-02-13 DIAGNOSIS — E782 Mixed hyperlipidemia: Secondary | ICD-10-CM | POA: Diagnosis not present

## 2022-02-13 DIAGNOSIS — F339 Major depressive disorder, recurrent, unspecified: Secondary | ICD-10-CM

## 2022-02-13 DIAGNOSIS — F17200 Nicotine dependence, unspecified, uncomplicated: Secondary | ICD-10-CM

## 2022-02-13 DIAGNOSIS — R600 Localized edema: Secondary | ICD-10-CM

## 2022-02-13 DIAGNOSIS — E0590 Thyrotoxicosis, unspecified without thyrotoxic crisis or storm: Secondary | ICD-10-CM

## 2022-02-13 DIAGNOSIS — K219 Gastro-esophageal reflux disease without esophagitis: Secondary | ICD-10-CM

## 2022-02-13 DIAGNOSIS — Z23 Encounter for immunization: Secondary | ICD-10-CM | POA: Diagnosis not present

## 2022-02-13 DIAGNOSIS — Z0000 Encounter for general adult medical examination without abnormal findings: Secondary | ICD-10-CM

## 2022-02-13 DIAGNOSIS — I2510 Atherosclerotic heart disease of native coronary artery without angina pectoris: Secondary | ICD-10-CM

## 2022-02-13 DIAGNOSIS — I10 Essential (primary) hypertension: Secondary | ICD-10-CM

## 2022-02-13 LAB — CBC WITH DIFFERENTIAL/PLATELET
Basophils Absolute: 0.1 10*3/uL (ref 0.0–0.1)
Basophils Relative: 0.6 % (ref 0.0–3.0)
Eosinophils Absolute: 0.4 10*3/uL (ref 0.0–0.7)
Eosinophils Relative: 3.4 % (ref 0.0–5.0)
HCT: 36.6 % (ref 36.0–46.0)
Hemoglobin: 12.2 g/dL (ref 12.0–15.0)
Lymphocytes Relative: 24.6 % (ref 12.0–46.0)
Lymphs Abs: 2.6 10*3/uL (ref 0.7–4.0)
MCHC: 33.4 g/dL (ref 30.0–36.0)
MCV: 85.6 fl (ref 78.0–100.0)
Monocytes Absolute: 0.8 10*3/uL (ref 0.1–1.0)
Monocytes Relative: 7.7 % (ref 3.0–12.0)
Neutro Abs: 6.7 10*3/uL (ref 1.4–7.7)
Neutrophils Relative %: 63.7 % (ref 43.0–77.0)
Platelets: 344 10*3/uL (ref 150.0–400.0)
RBC: 4.28 Mil/uL (ref 3.87–5.11)
RDW: 13.3 % (ref 11.5–15.5)
WBC: 10.5 10*3/uL (ref 4.0–10.5)

## 2022-02-13 LAB — COMPREHENSIVE METABOLIC PANEL
ALT: 12 U/L (ref 0–35)
AST: 14 U/L (ref 0–37)
Albumin: 4.2 g/dL (ref 3.5–5.2)
Alkaline Phosphatase: 52 U/L (ref 39–117)
BUN: 19 mg/dL (ref 6–23)
CO2: 29 mEq/L (ref 19–32)
Calcium: 9.5 mg/dL (ref 8.4–10.5)
Chloride: 100 mEq/L (ref 96–112)
Creatinine, Ser: 0.93 mg/dL (ref 0.40–1.20)
GFR: 63.72 mL/min (ref >60.00)
Glucose, Bld: 80 mg/dL (ref 70–99)
Potassium: 3.9 mEq/L (ref 3.5–5.1)
Sodium: 138 mEq/L (ref 135–145)
Total Bilirubin: 0.3 mg/dL (ref 0.2–1.2)
Total Protein: 7.3 g/dL (ref 6.0–8.3)

## 2022-02-13 LAB — LIPID PANEL
Cholesterol: 120 mg/dL (ref 0–200)
HDL: 43.5 mg/dL (ref >39.00)
LDL Cholesterol: 59 mg/dL (ref 0–99)
NonHDL: 76.69
Total CHOL/HDL Ratio: 3
Triglycerides: 88 mg/dL (ref 0.0–149.0)
VLDL: 17.6 mg/dL (ref 0.0–40.0)

## 2022-02-13 LAB — URINALYSIS
Bilirubin Urine: NEGATIVE
Hgb urine dipstick: NEGATIVE
Ketones, ur: NEGATIVE
Leukocytes,Ua: NEGATIVE
Nitrite: NEGATIVE
Specific Gravity, Urine: 1.025 (ref 1.000–1.030)
Total Protein, Urine: NEGATIVE
Urine Glucose: NEGATIVE
Urobilinogen, UA: 0.2 (ref 0.0–1.0)
pH: 6 (ref 5.0–8.0)

## 2022-02-13 LAB — HEMOGLOBIN A1C: Hgb A1c MFr Bld: 6 % (ref 4.6–6.5)

## 2022-02-13 LAB — VITAMIN D 25 HYDROXY (VIT D DEFICIENCY, FRACTURES): VITD: 19.05 ng/mL — ABNORMAL LOW (ref 30.00–100.00)

## 2022-02-13 LAB — TSH: TSH: 1.53 u[IU]/mL (ref 0.35–5.50)

## 2022-02-13 NOTE — Progress Notes (Signed)
Subjective:    Patient ID: Laurie [name], female    DOB: March 14, 1955, 67 y.o.   MRN: [id number]  HPI Patient presents for yearly preventative medicine examination. She is a pleasant 67 year old female who  has a past medical history of Allergy, Anxiety, Arthritis, Asthma, Chronic back pain, COPD (chronic obstructive pulmonary disease) ([place]), Depression, Esophageal stricture, GERD (gastroesophageal reflux disease), Heart murmur, Hematemesis (09/10/2018), Hyperlipidemia, Hypertension, IBS (irritable bowel syndrome), Interstitial cystitis, Neuromuscular disorder ([place]), and PONV (postoperative nausea and vomiting).  Hypertension-managed with metoprolol 50 mg daily and losartan 50 mg daily.  She does not monitor her blood pressure at home.  BP Readings from Last 3 Encounters:  02/13/22 132/82  01/01/22 130/88  12/11/21 118/70   Hyperlipidemia-managed with atorvastatin 40 mg daily and fenofibrate 145 mg daily.  She denies myalgia or fatigue  Lab Results  Component Value Date   CHOL 137 10/08/2021   HDL 39 (L) 10/08/2021   LDLCALC 60 10/08/2021   LDLDIRECT 123.0 03/14/2017   TRIG 236 (H) 10/08/2021   CHOLHDL 3.5 10/08/2021   Depression-managed with Effexor 150 mg daily.  In the past she has tried and failed Xanax, Elavil, Wellbutrin, citalopram, Valium, Cymbalta, and Pristiq  CAD-mild nonobstructive CAD in the proximal to mid LAD 80, severe stenosis mid circumflex into the OM1 with successful PTCA/DES x 2 in August 2021.  She is currently managed by cardiology on a routine basis is on Plavix 75 mg daily, aspirin 81 mg, and Lipitor 80 mg daily  Hyperthyroidism -managed by endocrinology, currently prescribed methimazole Rams-2 tabs daily  Tobacco Use -continues to smoke a pack a day  GERD - managed with Protonix 40 mg daily.   Lower extremity edema - used Lasix 20 mg daily PRN  All immunizations and health maintenance protocols were reviewed with the patient and needed orders were  placed.  Appropriate screening laboratory values were ordered for the patient including screening of hyperlipidemia, renal function and hepatic function. If indicated by BPH, a PSA was ordered.  Medication reconciliation,  past medical history, social history, problem list and allergies were reviewed in detail with the patient  Goals were established with regard to weight loss, exercise, and  diet in compliance with medications Wt Readings from Last 3 Encounters:  02/13/22 171 lb (77.6 kg)  01/01/22 174 lb (78.9 kg)  12/11/21 173 lb (78.5 kg)   She needs to call and schedule her appointment for colonoscopy and bone density   Review of Systems  Constitutional: Negative.   HENT: Negative.    Eyes: Negative.   Respiratory: Negative.    Cardiovascular: Negative.   Gastrointestinal: Negative.   Endocrine: Negative.   Genitourinary: Negative.   Musculoskeletal:  Positive for arthralgias.  Skin: Negative.   Allergic/Immunologic: Negative.   Neurological: Negative.   Hematological: Negative.   Psychiatric/Behavioral: Negative.     Past Medical History:  Diagnosis Date   Allergy    Anxiety    Arthritis    RA   Asthma    Chronic back pain    COPD (chronic obstructive pulmonary disease) ([facility])    Depression    Esophageal stricture    GERD (gastroesophageal reflux disease)    Heart murmur    Hematemesis 09/10/2018   Hyperlipidemia    Hypertension    IBS (irritable bowel syndrome)    Interstitial cystitis    Neuromuscular disorder ([facility])    fibromyalgia   PONV (postoperative nausea and vomiting)     Social History  Socioeconomic History   Marital status: Single    Spouse name: Not on file   Number of children: Not on file   Years of education: Not on file   Highest education level: Not on file  Occupational History   Occupation: disability  Tobacco Use   Smoking status: Every Day    Packs/day: 0.50    Types: Cigarettes   Smokeless tobacco: Never   Tobacco  comments:    trying to quit   Vaping Use   Vaping Use: Never used  Substance and Sexual Activity   Alcohol use: Never    Comment: occasional   Drug use: Yes    Types: Marijuana    Comment: occas   Sexual activity: Not on file  Other Topics Concern   Not on file  Social History Narrative   Not on file   Social Determinants of Health   Financial Resource Strain: Low Risk  (09/21/2021)   Overall Financial Resource Strain (CARDIA)    Difficulty of Paying Living Expenses: Not very hard  Food Insecurity: No Food Insecurity (12/03/2021)   Hunger Vital Sign    Worried About Running Out of Food in the Last Year: Never true    Ran Out of Food in the Last Year: Never true  Transportation Needs: No Transportation Needs (12/03/2021)   PRAPARE - Hydrologist (Medical): No    Lack of Transportation (Non-Medical): No  Physical Activity: Insufficiently Active (06/13/2021)   Exercise Vital Sign    Days of Exercise per Week: 3 days    Minutes of Exercise per Session: 10 min  Stress: No Stress Concern Present (06/13/2021)   Advance    Feeling of Stress : Only a little  Social Connections: Moderately Integrated (06/13/2021)   Social Connection and Isolation Panel [NHANES]    Frequency of Communication with Friends and Family: More than three times a week    Frequency of Social Gatherings with Friends and Family: More than three times a week    Attends Religious Services: More than 4 times per year    Active Member of Clubs or Organizations: Yes    Attends Archivist Meetings: More than 4 times per year    Marital Status: Divorced  Intimate Partner Violence: Not At Risk (11/22/2021)   Humiliation, Afraid, Rape, and Kick questionnaire    Fear of Current or Ex-Partner: No    Emotionally Abused: No    Physically Abused: No    Sexually Abused: No    Past Surgical History:  Procedure  Laterality Date   ABDOMINAL HYSTERECTOMY     APPENDECTOMY     BIOPSY  09/11/2018   Procedure: BIOPSY;  Surgeon: Rush Landmark, Telford Nab., MD;  Location: [facility];  Service: Gastroenterology;;   CERVICAL FUSION     CERVICAL LAMINECTOMY     CORONARY STENT INTERVENTION N/A 10/07/2019   Procedure: CORONARY STENT INTERVENTION;  Surgeon: Burnell Blanks, MD;  Location: [facility];  Service: Cardiovascular;  Laterality: N/A;   ESOPHAGOGASTRODUODENOSCOPY     ESOPHAGOGASTRODUODENOSCOPY (EGD) WITH PROPOFOL N/A 09/11/2018   Procedure: ESOPHAGOGASTRODUODENOSCOPY (EGD) WITH PROPOFOL;  Surgeon: Rush Landmark Telford Nab., MD;  Location: [facility];  Service: Gastroenterology;  Laterality: N/A;   FINGER SURGERY     KYPHOPLASTY N/A 03/29/2021   Procedure: Lumbar Three KYPHOPLASTY;  Surgeon: Kristeen Miss, MD;  Location: [facility];  Service: Neurosurgery;  Laterality: N/A;   LEFT HEART CATH AND CORONARY  ANGIOGRAPHY N/A 10/07/2019   Procedure: LEFT HEART CATH AND CORONARY ANGIOGRAPHY;  Surgeon: Burnell Blanks, MD;  Location: [facility];  Service: Cardiovascular;  Laterality: N/A;   LUMBAR FUSION     SAVORY DILATION N/A 09/11/2018   Procedure: SAVORY DILATION;  Surgeon: Rush Landmark Telford Nab., MD;  Location: [facility];  Service: Gastroenterology;  Laterality: N/A;   TONSILLECTOMY      Family History  Problem Relation Age of Onset   Dementia Mother    Heart attack Father    Coronary artery disease Father    Cancer Brother        esophageal   Esophageal cancer Brother    Coronary artery disease Paternal Aunt    Stomach cancer Paternal Aunt    Coronary artery disease Paternal Grandmother    Aneurysm Brother        aortic   Rectal cancer Neg Hx    Colon cancer Neg Hx    Thyroid disease Neg Hx     Allergies  Allergen Reactions   Codeine Nausea And Vomiting   Lisinopril Cough   Sulfonamide Derivatives Nausea And Vomiting   Tetracyclines & Related Rash    Current  Outpatient Medications on File Prior to Visit  Medication Sig Dispense Refill   albuterol (VENTOLIN HFA) 108 (90 Base) MCG/ACT inhaler INHALE TWO PUFFS BY MOUTH INTO LUNGS every SIX hours AS NEEDED FOR WHEEZING AND/OR SHORTNESS OF BREATH 8.5 g 3   amoxicillin-clavulanate (AUGMENTIN) 875-125 MG tablet Take 1 tablet by mouth 2 (two) times daily. 20 tablet 0   aspirin EC 81 MG tablet Take 1 tablet (81 mg total) by mouth daily. Swallow whole. 90 tablet 3   atorvastatin (LIPITOR) 40 MG tablet Take 1 tablet (40 mg total) by mouth every evening. 90 tablet 3   Blood Pressure Monitoring DEVI Use as needed DX I10 1 each 1   clopidogrel (PLAVIX) 75 MG tablet Take 1 tablet (75 mg total) by mouth daily. 90 tablet 3   fenofibrate (TRICOR) 145 MG tablet Take 1 tablet (145 mg total) by mouth daily. 90 tablet 3   fluticasone (FLONASE) 50 MCG/ACT nasal spray instill two SPRAYS in each nostril daily 16 g 6   furosemide (LASIX) 20 MG tablet TAKE ONE TABLET BY MOUTH ONCE daily AS NEEDED FOR LEG EDEMA 30 tablet 3   lidocaine (XYLOCAINE) 2 % solution Apply 5 ml  around gum of affected teeth up to 4 times per day. 100 mL 0   losartan (COZAAR) 50 MG tablet TAKE ONE TABLET BY MOUTH EVERY MORNING 90 tablet 1   methimazole (TAPAZOLE) 10 MG tablet TAKE TWO TABLETS BY MOUTH EVERY EVENING 60 tablet 3   metoprolol succinate (TOPROL-XL) 50 MG 24 hr tablet Take 50 mg by mouth daily. Take with or immediately following a meal.     nicotine (NICODERM CQ - DOSED IN MG/24 HOURS) 14 mg/24hr patch Use for the 2nd 28 days 28 patch 0   nicotine (NICODERM CQ - DOSED IN MG/24 HOURS) 21 mg/24hr patch Use for the first 28 days 28 patch 0   nicotine (NICODERM CQ - DOSED IN MG/24 HR) 7 mg/24hr patch Use last 28 patch 0   ondansetron (ZOFRAN-ODT) 8 MG disintegrating tablet TAKE ONE TABLET BY MOUTH every EIGHT hours AS NEEDED FOR NAUSEA AND VOMITING 20 tablet 2   oxyCODONE-acetaminophen (PERCOCET) 5-325 MG tablet Take 1-2 tablets by mouth every 4  (four) hours as needed for moderate pain. 20 tablet 0   pantoprazole (  PROTONIX) 40 MG tablet TAKE ONE TABLET BY MOUTH EVERY MORNING 90 tablet 3   venlafaxine XR (EFFEXOR-XR) 150 MG 24 hr capsule TAKE ONE CAPSULE BY MOUTH EVERY MORNING 90 capsule 1   nitroGLYCERIN (NITROSTAT) 0.4 MG SL tablet Place 1 tablet (0.4 mg total) under the tongue every 5 (five) minutes as needed for chest pain. 25 tablet 1   No current facility-administered medications on file prior to visit.    BP 132/82   Pulse 60   Temp 98.2 F (36.8 C) (Oral)   Ht '5\' 9"'$  (1.753 m)   Wt 171 lb (77.6 kg)   SpO2 99%   BMI 25.25 kg/m       Objective:   Physical Exam Vitals and nursing note reviewed.  Constitutional:      General: She is not in acute distress.    Appearance: Normal appearance. She is well-developed. She is not ill-appearing.  HENT:     Head: Normocephalic and atraumatic.     Right Ear: Tympanic membrane, ear canal and external ear normal. There is no impacted cerumen.     Left Ear: Tympanic membrane, ear canal and external ear normal. There is no impacted cerumen.     Nose: Nose normal. No congestion or rhinorrhea.     Mouth/Throat:     Mouth: Mucous membranes are moist.     Dentition: Abnormal dentition.     Pharynx: Oropharynx is clear. No oropharyngeal exudate or posterior oropharyngeal erythema.  Eyes:     General:        Right eye: No discharge.        Left eye: No discharge.     Extraocular Movements: Extraocular movements intact.     Conjunctiva/sclera: Conjunctivae normal.     Pupils: Pupils are equal, round, and reactive to light.  Neck:     Thyroid: No thyromegaly.     Vascular: No carotid bruit.     Trachea: No tracheal deviation.  Cardiovascular:     Rate and Rhythm: Normal rate and regular rhythm.     Pulses: Normal pulses.     Heart sounds: Normal heart sounds. No murmur heard.    No friction rub. No gallop.  Pulmonary:     Effort: Pulmonary effort is normal. No respiratory  distress.     Breath sounds: Normal breath sounds. No stridor. No wheezing, rhonchi or rales.  Chest:     Chest wall: No tenderness.  Abdominal:     General: Abdomen is flat. Bowel sounds are normal. There is no distension.     Palpations: Abdomen is soft. There is no mass.     Tenderness: There is abdominal tenderness in the left lower quadrant. There is no right CVA tenderness, left CVA tenderness, guarding or rebound.     Hernia: No hernia is present.  Musculoskeletal:        General: No swelling, tenderness, deformity or signs of injury. Normal range of motion.     Cervical back: Normal range of motion and neck supple.     Right lower leg: No edema.     Left lower leg: No edema.  Lymphadenopathy:     Cervical: No cervical adenopathy.  Skin:    General: Skin is warm and dry.     Coloration: Skin is not jaundiced or pale.     Findings: No bruising, erythema, lesion or rash.  Neurological:     General: No focal deficit present.     Mental Status: She is alert and oriented  to person, place, and time.     Cranial Nerves: No cranial nerve deficit.     Sensory: No sensory deficit.     Motor: No weakness.     Coordination: Coordination normal.     Gait: Gait normal.     Deep Tendon Reflexes: Reflexes normal.  Psychiatric:        Mood and Affect: Mood normal.        Behavior: Behavior normal.        Thought Content: Thought content normal.        Judgment: Judgment normal.       Assessment & Plan:  1. Routine general medical examination at a health care facility Today patient counseled on age appropriate routine health concerns for screening and prevention, each reviewed and up to date or declined. Immunizations reviewed and up to date or declined. Labs ordered and reviewed. Risk factors for depression reviewed and negative. Hearing function and visual acuity are intact. ADLs screened and addressed as needed. Functional ability and level of safety reviewed and appropriate.  Education, counseling and referrals performed based on assessed risks today. Patient provided with a copy of personalized plan for preventive services.   2. Essential hypertension -well controlled. No change in medication  - Urinalysis; Future - VITAMIN D 25 Hydroxy (Vit-D Deficiency, Fractures); Future - CBC with Differential/Platelet; Future - Comprehensive metabolic panel; Future - Hemoglobin A1c; Future - Lipid panel; Future - TSH; Future  3. Mixed hyperlipidemia - Continue statin and Fenofibrate  - Urinalysis; Future - VITAMIN D 25 Hydroxy (Vit-D Deficiency, Fractures); Future - CBC with Differential/Platelet; Future - Comprehensive metabolic panel; Future - Hemoglobin A1c; Future - Lipid panel; Future - TSH; Future  4. Atherosclerosis of native coronary artery of native heart without angina pectoris - Per cardiology  - CBC with Differential/Platelet; Future - Comprehensive metabolic panel; Future - Hemoglobin A1c; Future - Lipid panel; Future - TSH; Future  5. Hyperthyroidism - Per endocrinology  - Urinalysis; Future - VITAMIN D 25 Hydroxy (Vit-D Deficiency, Fractures); Future - CBC with Differential/Platelet; Future - Comprehensive metabolic panel; Future - Hemoglobin A1c; Future - Lipid panel; Future - TSH; Future  6. Depression, recurrent ([place]) - Continue with Effexor  - VITAMIN D 25 Hydroxy (Vit-D Deficiency, Fractures); Future - CBC with Differential/Platelet; Future - Comprehensive metabolic panel; Future - Hemoglobin A1c; Future - Lipid panel; Future - TSH; Future  7. Gastroesophageal reflux disease without esophagitis - Continue PPI - VITAMIN D 25 Hydroxy (Vit-D Deficiency, Fractures); Future - CBC with Differential/Platelet; Future - Comprehensive metabolic panel; Future - Hemoglobin A1c; Future - Lipid panel; Future - TSH; Future  8. TOBACCO USE - encouraged to quit smoking  - VITAMIN D 25 Hydroxy (Vit-D Deficiency, Fractures); Future -  CBC with Differential/Platelet; Future - Comprehensive metabolic panel; Future - Hemoglobin A1c; Future - Lipid panel; Future - TSH; Future  9. Lower extremity edema - Continue with Lasix PRN  - CBC with Differential/Platelet; Future - Comprehensive metabolic panel; Future - Hemoglobin A1c; Future - Lipid panel; Future - TSH; Future  [name], NP

## 2022-02-13 NOTE — Patient Instructions (Signed)
It was great seeing you today   We will follow up with you regarding your lab work   Please let me know if you need anything   

## 2022-02-14 ENCOUNTER — Telehealth: Payer: Self-pay | Admitting: Adult Health

## 2022-02-14 NOTE — Telephone Encounter (Signed)
Patient calling back for lab results  can be r4eached at [phone]

## 2022-02-14 NOTE — Telephone Encounter (Signed)
Pt is calling and would like blood work results 

## 2022-02-14 NOTE — Telephone Encounter (Signed)
Left message to return phone call.

## 2022-02-15 ENCOUNTER — Other Ambulatory Visit: Payer: Self-pay

## 2022-02-15 MED ORDER — VITAMIN D (ERGOCALCIFEROL) 1.25 MG (50000 UNIT) PO CAPS: 50000.0000 [IU] | ORAL_CAPSULE | ORAL | 0 refills | Status: AC

## 2022-02-15 NOTE — Telephone Encounter (Signed)
Patient notified of update  and verbalized understanding. 

## 2022-03-08 ENCOUNTER — Telehealth: Payer: Self-pay | Admitting: Pharmacist

## 2022-03-08 NOTE — Progress Notes (Signed)
Care Management & Coordination Services Pharmacy Team  Reason for Encounter: Medication coordination and delivery  Contacted patient to discuss medications and coordinate delivery from [facility]. Spoke with patient on 03/08/2022   C[facility] dispensing form sent to Edythe Clarity for review.   Last adherence delivery date:02/19/2022      Patient is due for next adherence delivery on: 03/20/2022, patient has requested this to be delivered on 03/24/2022, she states she has a few extra pill packs and will have plenty to get to this delivery date.   This delivery to include: Adherence Packaging  30 Days  Aspirin EC 81 mg: one tablet at dinner Furosemide 20 mg 1 tablet daily as needed Venlafaxine XR 150 mg: one capsule with breakfast Metoprolol succinate 50 MG 24 hr: one tablet at breakfast Losartan 50 mg: one tablet with breakfast Fluticasone 50 MCG/ACT nasal spray: Place 2 sprays into both nostrils daily Albuterol HFA : 2 puffs every 6 hours as needed Methimazole 10 mg: two tablets at dinner  Pantoprazole 40 mg: one tablet at breakfast Clopidogrel 75 mg: one tablet at breakfast  Atorvastatin 40 mg: one tablet at dinner Fenofibrate 145 mg: one tablet at dinner Ondansetron 8 mg: 1 tablet every 8 hours as needed for nausea or vomiting  Patient declined the following medications this month: None  Confirmed delivery date of 03/24/2022 per patients request, advised patient that pharmacy will contact them the morning of delivery.  Any concerns about your medications? Patient denies  How often do you forget or accidentally miss a dose? Patient denies  Is patient in packaging Yes  What is the date on your next pill pack? Patient is not near her pill packs to see date.  Any concerns or issues with your packaging? Patient denies  Recent blood pressure readings are as follows: Patient is not checking blood pressures.   Chart review:  Recent office visits:  02/13/2022 [name] NP - Patient was seen for Routine general medical examination at a health care facility and additional concerns. No medication changes.   Recent consult visits:  None  Hospital visits:  None  Medications: Outpatient Encounter Medications as of 03/08/2022  Medication Sig   Vitamin D, Ergocalciferol, (DRISDOL) 1.25 MG (50000 UNIT) CAPS capsule Take 1 capsule (50,000 Units total) by mouth every 7 (seven) days.   albuterol (VENTOLIN HFA) 108 (90 Base) MCG/ACT inhaler INHALE TWO PUFFS BY MOUTH INTO LUNGS every SIX hours AS NEEDED FOR WHEEZING AND/OR SHORTNESS OF BREATH   amoxicillin-clavulanate (AUGMENTIN) 875-125 MG tablet Take 1 tablet by mouth 2 (two) times daily.   aspirin EC 81 MG tablet Take 1 tablet (81 mg total) by mouth daily. Swallow whole.   atorvastatin (LIPITOR) 40 MG tablet Take 1 tablet (40 mg total) by mouth every evening.   Blood Pressure Monitoring DEVI Use as needed DX I10   clopidogrel (PLAVIX) 75 MG tablet Take 1 tablet (75 mg total) by mouth daily.   fenofibrate (TRICOR) 145 MG tablet Take 1 tablet (145 mg total) by mouth daily.   fluticasone (FLONASE) 50 MCG/ACT nasal spray instill two SPRAYS in each nostril daily   furosemide (LASIX) 20 MG tablet TAKE ONE TABLET BY MOUTH ONCE daily AS NEEDED FOR LEG EDEMA   lidocaine (XYLOCAINE) 2 % solution Apply 5 ml  around gum of affected teeth up to 4 times per day.   losartan (COZAAR) 50 MG tablet TAKE ONE TABLET BY MOUTH EVERY MORNING   methimazole (TAPAZOLE) 10 MG tablet TAKE TWO  TABLETS BY MOUTH EVERY EVENING   metoprolol succinate (TOPROL-XL) 50 MG 24 hr tablet Take 50 mg by mouth daily. Take with or immediately following a meal.   nicotine (NICODERM CQ - DOSED IN MG/24 HOURS) 14 mg/24hr patch Use for the 2nd 28 days   nicotine (NICODERM CQ - DOSED IN MG/24 HOURS) 21 mg/24hr patch Use for the first 28 days   nicotine (NICODERM CQ - DOSED IN MG/24 HR) 7 mg/24hr patch Use last   nitroGLYCERIN (NITROSTAT) 0.4 MG SL tablet  Place 1 tablet (0.4 mg total) under the tongue every 5 (five) minutes as needed for chest pain.   ondansetron (ZOFRAN-ODT) 8 MG disintegrating tablet TAKE ONE TABLET BY MOUTH every EIGHT hours AS NEEDED FOR NAUSEA AND VOMITING   oxyCODONE-acetaminophen (PERCOCET) 5-325 MG tablet Take 1-2 tablets by mouth every 4 (four) hours as needed for moderate pain.   pantoprazole (PROTONIX) 40 MG tablet TAKE ONE TABLET BY MOUTH EVERY MORNING   venlafaxine XR (EFFEXOR-XR) 150 MG 24 hr capsule TAKE ONE CAPSULE BY MOUTH EVERY MORNING   No facility-administered encounter medications on file as of 03/08/2022.   BP Readings from Last 3 Encounters:  02/13/22 132/82  01/01/22 130/88  12/11/21 118/70    Pulse Readings from Last 3 Encounters:  02/13/22 60  01/01/22 70  12/11/21 67    Lab Results  Component Value Date/Time   HGBA1C 6.0 02/13/2022 01:32 PM   HGBA1C 5.4 09/13/2018 03:11 AM   Lab Results  Component Value Date   CREATININE 0.93 02/13/2022   BUN 19 02/13/2022   GFR 63.72 02/13/2022   GFRNONAA >60 03/29/2021   GFRAA 76 09/27/2019   NA 138 02/13/2022   K 3.9 02/13/2022   CALCIUM 9.5 02/13/2022   CO2 29 02/13/2022   [place]  Clinical Pharmacist Assistant [phone]

## 2022-04-11 ENCOUNTER — Telehealth: Payer: Self-pay

## 2022-04-11 NOTE — Progress Notes (Signed)
Care Management & Coordination Services Pharmacy Team  Reason for Encounter: Medication coordination and delivery  Contacted patient to discuss medications and coordinate delivery from [facility]. Spoke with patient on 04/12/2022   Cycle dispensing form sent to [facility] for review.   Last adherence delivery date:03/20/2022      Patient is due for next adherence delivery on: 04/22/2022  This delivery to include: Adherence Packaging  30 Days  Aspirin EC 81 mg: one tablet at dinner Furosemide 20 mg 1 tablet daily as needed Venlafaxine XR 150 mg: one capsule with breakfast Metoprolol succinate 50 MG 24 hr: one tablet at breakfast Losartan 50 mg: one tablet with breakfast Fluticasone 50 MCG/ACT nasal spray: Place 2 sprays into both nostrils daily Albuterol HFA : 2 puffs every 6 hours as needed Methimazole 10 mg: two tablets at dinner  Pantoprazole 40 mg: one tablet at breakfast Clopidogrel 75 mg: one tablet at breakfast  Atorvastatin 40 mg: one tablet at dinner Fenofibrate 145 mg: one tablet at dinner Ondansetron 8 mg: 1 tablet every 8 hours as needed for nausea or vomiting  Patient declined the following medications this month: None  Confirmed delivery date of 04/22/2022, advised patient that pharmacy will contact them the morning of delivery.  Any concerns about your medications? Patient denies  How often do you forget or accidentally miss a dose? Patient denies  Is patient in packaging Yes  What is the date on your next pill pack? 04/12/22 PM  Any concerns or issues with your packaging? Patient denies  Recent blood pressure readings are as follows: Patient states she has not been checking  Chart review: Recent office visits:  None  Recent consult visits:  None  Hospital visits:  None  Medications: Outpatient Encounter Medications as of 04/11/2022  Medication Sig   Vitamin D, Ergocalciferol, (DRISDOL) 1.25 MG (50000 UNIT) CAPS capsule Take 1 capsule  (50,000 Units total) by mouth every 7 (seven) days.   albuterol (VENTOLIN HFA) 108 (90 Base) MCG/ACT inhaler INHALE TWO PUFFS BY MOUTH INTO LUNGS every SIX hours AS NEEDED FOR WHEEZING AND/OR SHORTNESS OF BREATH   amoxicillin-clavulanate (AUGMENTIN) 875-125 MG tablet Take 1 tablet by mouth 2 (two) times daily.   aspirin EC 81 MG tablet Take 1 tablet (81 mg total) by mouth daily. Swallow whole.   atorvastatin (LIPITOR) 40 MG tablet Take 1 tablet (40 mg total) by mouth every evening.   Blood Pressure Monitoring DEVI Use as needed DX I10   clopidogrel (PLAVIX) 75 MG tablet Take 1 tablet (75 mg total) by mouth daily.   fenofibrate (TRICOR) 145 MG tablet Take 1 tablet (145 mg total) by mouth daily.   fluticasone (FLONASE) 50 MCG/ACT nasal spray instill two SPRAYS in each nostril daily   furosemide (LASIX) 20 MG tablet TAKE ONE TABLET BY MOUTH ONCE daily AS NEEDED FOR LEG EDEMA   lidocaine (XYLOCAINE) 2 % solution Apply 5 ml  around gum of affected teeth up to 4 times per day.   losartan (COZAAR) 50 MG tablet TAKE ONE TABLET BY MOUTH EVERY MORNING   methimazole (TAPAZOLE) 10 MG tablet TAKE TWO TABLETS BY MOUTH EVERY EVENING   metoprolol succinate (TOPROL-XL) 50 MG 24 hr tablet Take 50 mg by mouth daily. Take with or immediately following a meal.   nicotine (NICODERM CQ - DOSED IN MG/24 HOURS) 14 mg/24hr patch Use for the 2nd 28 days   nicotine (NICODERM CQ - DOSED IN MG/24 HOURS) 21 mg/24hr patch Use for the first 28 days  nicotine (NICODERM CQ - DOSED IN MG/24 HR) 7 mg/24hr patch Use last   nitroGLYCERIN (NITROSTAT) 0.4 MG SL tablet Place 1 tablet (0.4 mg total) under the tongue every 5 (five) minutes as needed for chest pain.   ondansetron (ZOFRAN-ODT) 8 MG disintegrating tablet TAKE ONE TABLET BY MOUTH every EIGHT hours AS NEEDED FOR NAUSEA AND VOMITING   oxyCODONE-acetaminophen (PERCOCET) 5-325 MG tablet Take 1-2 tablets by mouth every 4 (four) hours as needed for moderate pain.   pantoprazole  (PROTONIX) 40 MG tablet TAKE ONE TABLET BY MOUTH EVERY MORNING   venlafaxine XR (EFFEXOR-XR) 150 MG 24 hr capsule TAKE ONE CAPSULE BY MOUTH EVERY MORNING   No facility-administered encounter medications on file as of 04/11/2022.   BP Readings from Last 3 Encounters:  02/13/22 132/82  01/01/22 130/88  12/11/21 118/70    Pulse Readings from Last 3 Encounters:  02/13/22 60  01/01/22 70  12/11/21 67    Lab Results  Component Value Date/Time   HGBA1C 6.0 02/13/2022 01:32 PM   HGBA1C 5.4 09/13/2018 03:11 AM   Lab Results  Component Value Date   CREATININE 0.93 02/13/2022   BUN 19 02/13/2022   GFR 63.72 02/13/2022   GFRNONAA >60 03/29/2021   GFRAA 76 09/27/2019   NA 138 02/13/2022   K 3.9 02/13/2022   CALCIUM 9.5 02/13/2022   CO2 29 02/13/2022   [place]  Clinical Pharmacist Assistant [phone]

## 2022-04-17 ENCOUNTER — Telehealth: Payer: Self-pay

## 2022-04-17 MED ORDER — METOPROLOL SUCCINATE ER 50 MG PO TB24: 50.0000 mg | ORAL_TABLET | Freq: Every day | ORAL | 1 refills | Status: DC

## 2022-04-17 NOTE — Telephone Encounter (Signed)
-----   Message from Maren Reamer, [facility] sent at 04/15/2022  3:44 PM EST ----- [facility] requesting refills on patient's Metoprolol XL '50mg'$ . Was previously prescribed by endo for patient but they are requesting PCP to take over.  [facility] - [place], [place] - [address]  Phone: [phone] Fax: [phone]   Thank you! [place] Pharmacist [phone]

## 2022-04-17 NOTE — Telephone Encounter (Signed)
Prescription sent to pharmacy. No other action needed. Pt informed!

## 2022-05-03 ENCOUNTER — Telehealth: Payer: Self-pay

## 2022-05-03 NOTE — Telephone Encounter (Signed)
   Pre-operative Risk Assessment    Patient Name: Laurie [name]  DOB: January 26, 1956 MRN: [id number]     Request for Surgical Clearance    Procedure:  Dental Extraction - Amount of Teeth to be Pulled:  1  Date of Surgery:  Clearance TBD                                 Surgeon:  Dr. Durene Cal Surgeon's Group or Practice Name:  [facility] Phone number:  [phone] Fax number:  [phone]   Type of Clearance Requested:   - Pharmacy:  Hold Aspirin and Clopidogrel (Plavix) INSTRUCTIONS WHEN TO HOLD   Type of Anesthesia:  Local    Additional requests/questions:    SignedJacinta Shoe   05/03/2022, 5:21 PM

## 2022-05-06 NOTE — Telephone Encounter (Signed)
   Primary Cardiologist: Lauree Chandler, MD  Chart reviewed as part of pre-operative protocol coverage. Simple dental extractions (1-2 teeth) are considered low risk procedures per guidelines and generally do not require any specific cardiac clearance. It is also generally accepted that for simple extractions and dental cleanings, there is no need to interrupt blood thinner therapy.   SBE prophylaxis is not required for the patient.  I will route this recommendation to the requesting party via Epic fax function and remove from pre-op pool.  Please call with questions.  Emmaline Life, NP-C  05/06/2022, 9:47 AM [address], [address] Office [phone] Fax [phone]

## 2022-05-08 ENCOUNTER — Other Ambulatory Visit: Payer: Self-pay | Admitting: Internal Medicine

## 2022-05-08 ENCOUNTER — Telehealth: Payer: Self-pay

## 2022-05-08 ENCOUNTER — Other Ambulatory Visit: Payer: Self-pay | Admitting: Adult Health

## 2022-05-08 NOTE — Progress Notes (Signed)
Care Management & Coordination Services Pharmacy Team  Reason for Encounter: Medication coordination and delivery  Contacted patient to discuss hypertension,  medications and coordinate delivery from [facility]. Unsuccessful outreach. Left voicemail for patient to return call. Multiple attempts  Cycle dispensing form sent to [facility] for review.   Last adherence delivery date: 04/22/2022      Patient is due for next adherence delivery on: 05/21/2022  This delivery to include: Adherence Packaging  30 Days  Aspirin EC 81 mg: one tablet at dinner Furosemide 20 mg 1 tablet daily as needed Venlafaxine XR 150 mg: one capsule with breakfast Metoprolol succinate 50 MG 24 hr: one tablet at breakfast Losartan 50 mg: one tablet with breakfast Fluticasone 50 MCG/ACT nasal spray: Place 2 sprays into both nostrils daily Albuterol HFA : 2 puffs every 6 hours as needed Methimazole 10 mg: two tablets at dinner  Pantoprazole 40 mg: one tablet at breakfast Clopidogrel 75 mg: one tablet at breakfast  Atorvastatin 40 mg: one tablet at dinner Fenofibrate 145 mg: one tablet at dinner Ondansetron 8 mg: 1 tablet every 8 hours as needed for nausea or vomiting  Patient declined the following medications this month:   Any concerns about your medications?   How often do you forget or accidentally miss a dose?   Is patient in packaging Yes  If yes  What is the date on your next pill pack?  Any concerns or issues with your packaging?  Recent blood pressure readings are as follows:   How often are you checking your Blood Pressure?   she checks her blood pressure   taking her medication.  Any readings above 180/100?  If yes any symptoms of hypertensive emergency?    Chart review: Recent office visits:  None  Recent consult visits:  None  Hospital visits:  None  Medications: Outpatient Encounter Medications as of 05/08/2022  Medication Sig   Vitamin D, Ergocalciferol,  (DRISDOL) 1.25 MG (50000 UNIT) CAPS capsule Take 1 capsule (50,000 Units total) by mouth every 7 (seven) days.   albuterol (VENTOLIN HFA) 108 (90 Base) MCG/ACT inhaler INHALE TWO PUFFS BY MOUTH INTO LUNGS every SIX hours AS NEEDED FOR WHEEZING AND/OR SHORTNESS OF BREATH   amoxicillin-clavulanate (AUGMENTIN) 875-125 MG tablet Take 1 tablet by mouth 2 (two) times daily.   aspirin EC 81 MG tablet Take 1 tablet (81 mg total) by mouth daily. Swallow whole.   atorvastatin (LIPITOR) 40 MG tablet Take 1 tablet (40 mg total) by mouth every evening.   Blood Pressure Monitoring DEVI Use as needed DX I10   clopidogrel (PLAVIX) 75 MG tablet Take 1 tablet (75 mg total) by mouth daily.   fenofibrate (TRICOR) 145 MG tablet Take 1 tablet (145 mg total) by mouth daily.   fluticasone (FLONASE) 50 MCG/ACT nasal spray instill two SPRAYS in each nostril daily   furosemide (LASIX) 20 MG tablet TAKE ONE TABLET BY MOUTH ONCE daily AS NEEDED FOR LEG EDEMA   lidocaine (XYLOCAINE) 2 % solution Apply 5 ml  around gum of affected teeth up to 4 times per day.   losartan (COZAAR) 50 MG tablet TAKE ONE TABLET BY MOUTH EVERY MORNING   methimazole (TAPAZOLE) 10 MG tablet TAKE TWO TABLETS BY MOUTH EVERY EVENING   metoprolol succinate (TOPROL-XL) 50 MG 24 hr tablet Take 1 tablet (50 mg total) by mouth daily. Take with or immediately following a meal.   nicotine (NICODERM CQ - DOSED IN MG/24 HOURS) 14 mg/24hr patch Use for the 2nd  28 days   nicotine (NICODERM CQ - DOSED IN MG/24 HOURS) 21 mg/24hr patch Use for the first 28 days   nicotine (NICODERM CQ - DOSED IN MG/24 HR) 7 mg/24hr patch Use last   nitroGLYCERIN (NITROSTAT) 0.4 MG SL tablet Place 1 tablet (0.4 mg total) under the tongue every 5 (five) minutes as needed for chest pain.   ondansetron (ZOFRAN-ODT) 8 MG disintegrating tablet TAKE ONE TABLET BY MOUTH every EIGHT hours AS NEEDED FOR NAUSEA AND VOMITING   oxyCODONE-acetaminophen (PERCOCET) 5-325 MG tablet Take 1-2 tablets by  mouth every 4 (four) hours as needed for moderate pain.   pantoprazole (PROTONIX) 40 MG tablet TAKE ONE TABLET BY MOUTH EVERY MORNING   venlafaxine XR (EFFEXOR-XR) 150 MG 24 hr capsule TAKE ONE CAPSULE BY MOUTH EVERY MORNING   No facility-administered encounter medications on file as of 05/08/2022.   BP Readings from Last 3 Encounters:  02/13/22 132/82  01/01/22 130/88  12/11/21 118/70    Pulse Readings from Last 3 Encounters:  02/13/22 60  01/01/22 70  12/11/21 67    Lab Results  Component Value Date/Time   HGBA1C 6.0 02/13/2022 01:32 PM   HGBA1C 5.4 09/13/2018 03:11 AM   Lab Results  Component Value Date   CREATININE 0.93 02/13/2022   BUN 19 02/13/2022   GFR 63.72 02/13/2022   GFRNONAA >60 03/29/2021   GFRAA 76 09/27/2019   NA 138 02/13/2022   K 3.9 02/13/2022   CALCIUM 9.5 02/13/2022   CO2 29 02/13/2022   [place]  Clinical Pharmacist Assistant [phone]

## 2022-05-08 NOTE — Telephone Encounter (Addendum)
Received a form from [facility] regarding extractions. Form faxed to Cardio 05/03/2022. However, received PPW again from pt as well as fax. Tried to call pt to advise to follow up with Cardio but no answer. Looks like Cardio already started this.

## 2022-05-14 ENCOUNTER — Telehealth: Payer: Self-pay | Admitting: Adult Health

## 2022-05-14 NOTE — Telephone Encounter (Signed)
Tried to call pt no answer. Pt will need an appt. Pt can go to urgent care for assistance.

## 2022-05-14 NOTE — Telephone Encounter (Signed)
Pt called to inform NP that she went to the dentist and they refused her because she has [facility] Medicare.     Pt states she really needs an antibiotic.    Pt was offered an OV and said she just wants an antibiotic. Her face is swollen, she is in pain and she is going out of town soon.  Please advise.

## 2022-05-15 NOTE — Telephone Encounter (Signed)
Looks like pt is scheduled for 05/16/2022

## 2022-05-16 ENCOUNTER — Ambulatory Visit (INDEPENDENT_AMBULATORY_CARE_PROVIDER_SITE_OTHER): Payer: Medicare Other | Admitting: Adult Health

## 2022-05-16 ENCOUNTER — Encounter: Payer: Self-pay | Admitting: Adult Health

## 2022-05-16 ENCOUNTER — Ambulatory Visit (INDEPENDENT_AMBULATORY_CARE_PROVIDER_SITE_OTHER): Payer: Medicare Other

## 2022-05-16 VITALS — BP 140/70 | HR 75 | Temp 97.7°F | Ht 69.0 in | Wt 178.0 lb

## 2022-05-16 DIAGNOSIS — M79642 Pain in left hand: Secondary | ICD-10-CM | POA: Diagnosis not present

## 2022-05-16 DIAGNOSIS — K0889 Other specified disorders of teeth and supporting structures: Secondary | ICD-10-CM | POA: Diagnosis not present

## 2022-05-16 MED ORDER — PENICILLIN V POTASSIUM 500 MG PO TABS: 500.0000 mg | ORAL_TABLET | Freq: Three times a day (TID) | ORAL | 0 refills | Status: AC

## 2022-05-16 NOTE — Progress Notes (Signed)
Subjective:    Patient ID: Laurie [name], female    DOB: 1955-09-25, 67 y.o.   MRN: [id number]  HPI 67 year old female who  has a past medical history of Allergy, Anxiety, Arthritis, Asthma, Chronic back pain, COPD (chronic obstructive pulmonary disease), Depression, Esophageal stricture, GERD (gastroesophageal reflux disease), Heart murmur, Hematemesis (09/10/2018), Hyperlipidemia, Hypertension, IBS (irritable bowel syndrome), Interstitial cystitis, Neuromuscular disorder, and PONV (postoperative nausea and vomiting).  She presents to the office today for concern of a dental abscess.  She does report that she has been to multiple dentists and cannot order right now to have her teeth pulled.  She does have family members that will help her with money to have the surgery done.  She is concerned that she has another dental abscess.  Reports pain in her mouth, bad taste in her mouth, and pain when chewing.  She also reports that she got tipped up this morning going into to the kitchen this morning and fell, she used her left hand to brace the fall.  Her middle finger is swollen, bruised, and she has loss of range of motion.   Review of Systems See HPI   Past Medical History:  Diagnosis Date   Allergy    Anxiety    Arthritis    RA   Asthma    Chronic back pain    COPD (chronic obstructive pulmonary disease)    Depression    Esophageal stricture    GERD (gastroesophageal reflux disease)    Heart murmur    Hematemesis 09/10/2018   Hyperlipidemia    Hypertension    IBS (irritable bowel syndrome)    Interstitial cystitis    Neuromuscular disorder    fibromyalgia   PONV (postoperative nausea and vomiting)     Social History   Socioeconomic History   Marital status: Single    Spouse name: Not on file   Number of children: Not on file   Years of education: Not on file   Highest education level: Not on file  Occupational History   Occupation: disability  Tobacco Use    Smoking status: Every Day    Packs/day: .5    Types: Cigarettes   Smokeless tobacco: Never   Tobacco comments:    trying to quit   Vaping Use   Vaping Use: Never used  Substance and Sexual Activity   Alcohol use: Never    Comment: occasional   Drug use: Yes    Types: Marijuana    Comment: occas   Sexual activity: Not on file  Other Topics Concern   Not on file  Social History Narrative   Not on file   Social Determinants of Health   Financial Resource Strain: Low Risk  (09/21/2021)   Overall Financial Resource Strain (CARDIA)    Difficulty of Paying Living Expenses: Not very hard  Food Insecurity: No Food Insecurity (12/03/2021)   Hunger Vital Sign    Worried About Running Out of Food in the Last Year: Never true    Ran Out of Food in the Last Year: Never true  Transportation Needs: No Transportation Needs (12/03/2021)   PRAPARE - Hydrologist (Medical): No    Lack of Transportation (Non-Medical): No  Physical Activity: Insufficiently Active (06/13/2021)   Exercise Vital Sign    Days of Exercise per Week: 3 days    Minutes of Exercise per Session: 10 min  Stress: No Stress Concern Present (06/13/2021)   [employer] - Occupational Stress Questionnaire    Feeling of Stress : Only a little  Social Connections: Moderately Integrated (06/13/2021)   Social Connection and Isolation Panel [NHANES]    Frequency of Communication with Friends and Family: More than three times a week    Frequency of Social Gatherings with Friends and Family: More than three times a week    Attends Religious Services: More than 4 times per year    Active Member of Clubs or Organizations: Yes    Attends Archivist Meetings: More than 4 times per year    Marital Status: Divorced  Intimate Partner Violence: Not At Risk (11/22/2021)   Humiliation, Afraid, Rape, and Kick questionnaire    Fear of Current or Ex-Partner: No    Emotionally  Abused: No    Physically Abused: No    Sexually Abused: No    Past Surgical History:  Procedure Laterality Date   ABDOMINAL HYSTERECTOMY     APPENDECTOMY     BIOPSY  09/11/2018   Procedure: BIOPSY;  Surgeon: Rush Landmark, Telford Nab., MD;  Location: [facility];  Service: Gastroenterology;;   CERVICAL FUSION     CERVICAL LAMINECTOMY     CORONARY STENT INTERVENTION N/A 10/07/2019   Procedure: CORONARY STENT INTERVENTION;  Surgeon: Burnell Blanks, MD;  Location: [place] [facility];  Service: Cardiovascular;  Laterality: N/A;   ESOPHAGOGASTRODUODENOSCOPY     ESOPHAGOGASTRODUODENOSCOPY (EGD) WITH PROPOFOL N/A 09/11/2018   Procedure: ESOPHAGOGASTRODUODENOSCOPY (EGD) WITH PROPOFOL;  Surgeon: Rush Landmark Telford Nab., MD;  Location: [facility];  Service: Gastroenterology;  Laterality: N/A;   FINGER SURGERY     KYPHOPLASTY N/A 03/29/2021   Procedure: Lumbar Three KYPHOPLASTY;  Surgeon: Kristeen Miss, MD;  Location: [facility];  Service: Neurosurgery;  Laterality: N/A;   LEFT HEART CATH AND CORONARY ANGIOGRAPHY N/A 10/07/2019   Procedure: LEFT HEART CATH AND CORONARY ANGIOGRAPHY;  Surgeon: Burnell Blanks, MD;  Location: [facility];  Service: Cardiovascular;  Laterality: N/A;   LUMBAR FUSION     SAVORY DILATION N/A 09/11/2018   Procedure: SAVORY DILATION;  Surgeon: Rush Landmark Telford Nab., MD;  Location: [facility];  Service: Gastroenterology;  Laterality: N/A;   TONSILLECTOMY      Family History  Problem Relation Age of Onset   Dementia Mother    Heart attack Father    Coronary artery disease Father    Cancer Brother        esophageal   Esophageal cancer Brother    Coronary artery disease Paternal Aunt    Stomach cancer Paternal Aunt    Coronary artery disease Paternal Grandmother    Aneurysm Brother        aortic   Rectal cancer Neg Hx    Colon cancer Neg Hx    Thyroid disease Neg Hx     Allergies  Allergen Reactions   Codeine Nausea And Vomiting    Lisinopril Cough   Sulfonamide Derivatives Nausea And Vomiting   Tetracyclines & Related Rash    Current Outpatient Medications on File Prior to Visit  Medication Sig Dispense Refill   albuterol (VENTOLIN HFA) 108 (90 Base) MCG/ACT inhaler INHALE TWO PUFFS BY MOUTH INTO LUNGS every SIX hours AS NEEDED FOR WHEEZING AND/OR SHORTNESS OF BREATH 8.5 g 3   amoxicillin-clavulanate (AUGMENTIN) 875-125 MG tablet Take 1 tablet by mouth 2 (two) times daily. 20 tablet 0   aspirin EC 81 MG tablet Take 1 tablet (81 mg total) by mouth daily. Swallow whole. 90 tablet 3  atorvastatin (LIPITOR) 40 MG tablet Take 1 tablet (40 mg total) by mouth every evening. 90 tablet 3   Blood Pressure Monitoring DEVI Use as needed DX I10 1 each 1   clopidogrel (PLAVIX) 75 MG tablet Take 1 tablet (75 mg total) by mouth daily. 90 tablet 3   fenofibrate (TRICOR) 145 MG tablet Take 1 tablet (145 mg total) by mouth daily. 90 tablet 3   fluticasone (FLONASE) 50 MCG/ACT nasal spray instill two SPRAYS in each nostril daily 16 g 6   furosemide (LASIX) 20 MG tablet TAKE ONE TABLET BY MOUTH ONCE daily AS NEEDED FOR LEG EDEMA 30 tablet 3   lidocaine (XYLOCAINE) 2 % solution Apply 5 ml  around gum of affected teeth up to 4 times per day. 100 mL 0   losartan (COZAAR) 50 MG tablet TAKE ONE TABLET BY MOUTH EVERY MORNING 90 tablet 1   methimazole (TAPAZOLE) 10 MG tablet TAKE TWO TABLETS BY MOUTH EVERY EVENING 60 tablet 0   metoprolol succinate (TOPROL-XL) 50 MG 24 hr tablet Take 1 tablet (50 mg total) by mouth daily. Take with or immediately following a meal. 90 tablet 1   nicotine (NICODERM CQ - DOSED IN MG/24 HOURS) 14 mg/24hr patch Use for the 2nd 28 days 28 patch 0   nicotine (NICODERM CQ - DOSED IN MG/24 HOURS) 21 mg/24hr patch Use for the first 28 days 28 patch 0   nicotine (NICODERM CQ - DOSED IN MG/24 HR) 7 mg/24hr patch Use last 28 patch 0   ondansetron (ZOFRAN-ODT) 8 MG disintegrating tablet TAKE ONE TABLET BY MOUTH every EIGHT  hours AS NEEDED FOR NAUSEA AND VOMITING 20 tablet 3   oxyCODONE-acetaminophen (PERCOCET) 5-325 MG tablet Take 1-2 tablets by mouth every 4 (four) hours as needed for moderate pain. 20 tablet 0   pantoprazole (PROTONIX) 40 MG tablet TAKE ONE TABLET BY MOUTH EVERY MORNING 90 tablet 3   venlafaxine XR (EFFEXOR-XR) 150 MG 24 hr capsule TAKE ONE CAPSULE BY MOUTH EVERY MORNING 90 capsule 1   Vitamin D, Ergocalciferol, (DRISDOL) 1.25 MG (50000 UNIT) CAPS capsule Take 1 capsule (50,000 Units total) by mouth every 7 (seven) days. 25 capsule 0   nitroGLYCERIN (NITROSTAT) 0.4 MG SL tablet Place 1 tablet (0.4 mg total) under the tongue every 5 (five) minutes as needed for chest pain. 25 tablet 1   No current facility-administered medications on file prior to visit.    Pulse 75   Temp 97.7 F (36.5 C) (Oral)   Ht 5\' 9"  (1.753 m)   Wt 178 lb (80.7 kg)   SpO2 97%   BMI 26.29 kg/m       Objective:   Physical Exam Vitals and nursing note reviewed.  Constitutional:      Appearance: Normal appearance.  HENT:     Mouth/Throat:     Dentition: Abnormal dentition. Dental abscesses present.  Cardiovascular:     Rate and Rhythm: Normal rate and regular rhythm.     Pulses: Normal pulses.     Heart sounds: Normal heart sounds.  Pulmonary:     Effort: Pulmonary effort is normal.     Breath sounds: Normal breath sounds.  Musculoskeletal:     Left hand: Swelling and tenderness present. Decreased range of motion. Normal sensation. There is no disruption of two-point discrimination. Normal capillary refill. Normal pulse.     Comments: She had swelling, bruising and pain to MP joint of left middle finger.   Skin:    General: Skin is  warm and dry.     Capillary Refill: Capillary refill takes less than 2 seconds.  Neurological:     General: No focal deficit present.     Mental Status: She is alert and oriented to person, place, and time.  Psychiatric:        Mood and Affect: Mood normal.        Behavior:  Behavior normal.        Thought Content: Thought content normal.        Judgment: Judgment normal.        Assessment & Plan:  1. Left hand pain - Will get xray  - Advised to buddy tape and ice until we get xray results back  - DG Hand Complete Left; Future  2. Pain, dental  - penicillin v potassium (VEETID) 500 MG tablet; Take 1 tablet (500 mg total) by mouth 3 (three) times daily for 10 days.  Dispense: 30 tablet; Refill: 0  [name], NP

## 2022-05-21 ENCOUNTER — Other Ambulatory Visit: Payer: Self-pay

## 2022-05-21 DIAGNOSIS — M79642 Pain in left hand: Secondary | ICD-10-CM

## 2022-05-24 ENCOUNTER — Ambulatory Visit (HOSPITAL_BASED_OUTPATIENT_CLINIC_OR_DEPARTMENT_OTHER): Payer: Medicare Other | Admitting: Orthopaedic Surgery

## 2022-05-30 NOTE — Progress Notes (Deleted)
Name: Laurie [name]  MRN/ DOB: [id number], 08-30-1955    Age/ Sex: 67 y.o., female     PCP: Shirline Frees, NP   Reason for Endocrinology Evaluation: hyperthyroidism     Initial Endocrinology Clinic Visit: ***    PATIENT IDENTIFIER: Laurie [name] is a 67 y.o., female with a past medical history of CAD, HTN, hyperthyroidism. She has followed with [facility] Endocrinology clinic since *** for consultative assistance with management of her hyperthyroidism.   HISTORICAL SUMMARY: The patient was first diagnosed with hyperthyroidism in 2021, has been on methimazole since diagnosis.     SUBJECTIVE:    Today (05/30/2022):  Laurie [name] is here for a follow up on methimazole    Methimazole 10 mg, 2 tabs daily   HISTORY:  Past Medical History:  Past Medical History:  Diagnosis Date   Allergy    Anxiety    Arthritis    RA   Asthma    Chronic back pain    COPD (chronic obstructive pulmonary disease)    Depression    Esophageal stricture    GERD (gastroesophageal reflux disease)    Heart murmur    Hematemesis 09/10/2018   Hyperlipidemia    Hypertension    IBS (irritable bowel syndrome)    Interstitial cystitis    Neuromuscular disorder    fibromyalgia   PONV (postoperative nausea and vomiting)    Past Surgical History:  Past Surgical History:  Procedure Laterality Date   ABDOMINAL HYSTERECTOMY     APPENDECTOMY     BIOPSY  09/11/2018   Procedure: BIOPSY;  Surgeon: Lemar Lofty., MD;  Location: [facility] ENDOSCOPY;  Service: Gastroenterology;;   CERVICAL FUSION     CERVICAL LAMINECTOMY     CORONARY STENT INTERVENTION N/A 10/07/2019   Procedure: CORONARY STENT INTERVENTION;  Surgeon: Kathleene Hazel, MD;  Location: [facility] INVASIVE CV LAB;  Service: Cardiovascular;  Laterality: N/A;   ESOPHAGOGASTRODUODENOSCOPY     ESOPHAGOGASTRODUODENOSCOPY (EGD) WITH PROPOFOL N/A 09/11/2018   Procedure: ESOPHAGOGASTRODUODENOSCOPY (EGD) WITH PROPOFOL;  Surgeon: Meridee Score  Netty Starring., MD;  Location: [facility] ENDOSCOPY;  Service: Gastroenterology;  Laterality: N/A;   FINGER SURGERY     KYPHOPLASTY N/A 03/29/2021   Procedure: Lumbar Three KYPHOPLASTY;  Surgeon: Barnett Abu, MD;  Location: [facility] OR;  Service: Neurosurgery;  Laterality: N/A;   LEFT HEART CATH AND CORONARY ANGIOGRAPHY N/A 10/07/2019   Procedure: LEFT HEART CATH AND CORONARY ANGIOGRAPHY;  Surgeon: Kathleene Hazel, MD;  Location: [facility] INVASIVE CV LAB;  Service: Cardiovascular;  Laterality: N/A;   LUMBAR FUSION     SAVORY DILATION N/A 09/11/2018   Procedure: SAVORY DILATION;  Surgeon: Meridee Score Netty Starring., MD;  Location: [facility] ENDOSCOPY;  Service: Gastroenterology;  Laterality: N/A;   TONSILLECTOMY     Social History:  reports that she has been smoking cigarettes. She has been smoking an average of .5 packs per day. She has never used smokeless tobacco. She reports current drug use. Drug: Marijuana. She reports that she does not drink alcohol. Family History:  Family History  Problem Relation Age of Onset   Dementia Mother    Heart attack Father    Coronary artery disease Father    Cancer Brother        esophageal   Esophageal cancer Brother    Coronary artery disease Paternal Aunt    Stomach cancer Paternal Aunt    Coronary artery disease Paternal Grandmother    Aneurysm Brother        aortic  Rectal cancer Neg Hx    Colon cancer Neg Hx    Thyroid disease Neg Hx      HOME MEDICATIONS: Allergies as of 05/31/2022       Reactions   Codeine Nausea And Vomiting   Lisinopril Cough   Sulfonamide Derivatives Nausea And Vomiting   Tetracyclines & Related Rash        Medication List        Accurate as of May 30, 2022  5:07 PM. If you have any questions, ask your nurse or doctor.          albuterol 108 (90 Base) MCG/ACT inhaler Commonly known as: VENTOLIN HFA INHALE TWO PUFFS BY MOUTH INTO LUNGS every SIX hours AS NEEDED FOR WHEEZING AND/OR SHORTNESS OF BREATH   aspirin EC 81 MG  tablet Take 1 tablet (81 mg total) by mouth daily. Swallow whole.   atorvastatin 40 MG tablet Commonly known as: LIPITOR Take 1 tablet (40 mg total) by mouth every evening.   [name] Pressure Monitoring Devi Use as needed DX I10   clopidogrel 75 MG tablet Commonly known as: PLAVIX Take 1 tablet (75 mg total) by mouth daily.   fenofibrate 145 MG tablet Commonly known as: Tricor Take 1 tablet (145 mg total) by mouth daily.   fluticasone 50 MCG/ACT nasal spray Commonly known as: FLONASE instill two SPRAYS in each nostril daily   furosemide 20 MG tablet Commonly known as: LASIX TAKE ONE TABLET BY MOUTH ONCE daily AS NEEDED FOR LEG EDEMA   lidocaine 2 % solution Commonly known as: XYLOCAINE Apply 5 ml  around gum of affected teeth up to 4 times per day.   losartan 50 MG tablet Commonly known as: COZAAR TAKE ONE TABLET BY MOUTH EVERY MORNING   methimazole 10 MG tablet Commonly known as: TAPAZOLE TAKE TWO TABLETS BY MOUTH EVERY EVENING   metoprolol succinate 50 MG 24 hr tablet Commonly known as: TOPROL-XL Take 1 tablet (50 mg total) by mouth daily. Take with or immediately following a meal.   nicotine 21 mg/24hr patch Commonly known as: NICODERM CQ - dosed in mg/24 hours Use for the first 28 days   nicotine 14 mg/24hr patch Commonly known as: NICODERM CQ - dosed in mg/24 hours Use for the 2nd 28 days   nicotine 7 mg/24hr patch Commonly known as: NICODERM CQ - dosed in mg/24 hr Use last   nitroGLYCERIN 0.4 MG SL tablet Commonly known as: NITROSTAT Place 1 tablet (0.4 mg total) under the tongue every 5 (five) minutes as needed for chest pain.   ondansetron 8 MG disintegrating tablet Commonly known as: ZOFRAN-ODT TAKE ONE TABLET BY MOUTH every EIGHT hours AS NEEDED FOR NAUSEA AND VOMITING   oxyCODONE-acetaminophen 5-325 MG tablet Commonly known as: Percocet Take 1-2 tablets by mouth every 4 (four) hours as needed for moderate pain.   pantoprazole 40 MG  tablet Commonly known as: PROTONIX TAKE ONE TABLET BY MOUTH EVERY MORNING   venlafaxine XR 150 MG 24 hr capsule Commonly known as: EFFEXOR-XR TAKE ONE CAPSULE BY MOUTH EVERY MORNING   Vitamin D (Ergocalciferol) 1.25 MG (50000 UNIT) Caps capsule Commonly known as: DRISDOL Take 1 capsule (50,000 Units total) by mouth every 7 (seven) days.          OBJECTIVE:   PHYSICAL EXAM: VS: There were no vitals taken for this visit.   EXAM: General: Pt appears well and is in NAD  Eyes: External eye exam normal without stare, lid lag or exophthalmos.  EOM intact.    Neck: General: Supple without adenopathy. Thyroid: Thyroid size normal.  No goiter or nodules appreciated. No thyroid bruit.  Lungs: Clear with good BS bilat with no rales, rhonchi, or wheezes  Heart: Auscultation: RRR.  Abdomen: Normoactive bowel sounds, soft, nontender, without masses or organomegaly palpable  Extremities:  BL LE: No pretibial edema normal ROM and strength.  Mental Status: Judgment, insight: Intact Orientation: Oriented to time, place, and person Mood and affect: No depression, anxiety, or agitation     DATA REVIEWED: ***   Latest Reference Range & Units 02/13/22 13:32  Sodium 135 - 145 mEq/L 138  Potassium 3.5 - 5.1 mEq/L 3.9  Chloride 96 - 112 mEq/L 100  CO2 19 - 32 mEq/L 29  Glucose 70 - 99 mg/dL 80  BUN 6 - 23 mg/dL 19  Creatinine 2.13 - 0.86 mg/dL 5.78  Calcium 8.4 - 46.9 mg/dL 9.5  Alkaline Phosphatase 39 - 117 U/L 52  Albumin 3.5 - 5.2 g/dL 4.2  AST 0 - 37 U/L 14  ALT 0 - 35 U/L 12  Total Protein 6.0 - 8.3 g/dL 7.3  Total Bilirubin 0.2 - 1.2 mg/dL 0.3  GFR >62.95 mL/min 63.72    Latest Reference Range & Units 02/13/22 13:32  WBC 4.0 - 10.5 K/uL 10.5  RBC 3.87 - 5.11 Mil/uL 4.28  Hemoglobin 12.0 - 15.0 g/dL 28.4  HCT 13.2 - 44.0 % 36.6  MCV 78.0 - 100.0 fl 85.6  MCHC 30.0 - 36.0 g/dL 10.2  RDW 72.5 - 36.6 % 13.3  Platelets 150.0 - 400.0 K/uL 344.0  Neutrophils 43.0 - 77.0 %  63.7  Lymphocytes 12.0 - 46.0 % 24.6  Monocytes Relative 3.0 - 12.0 % 7.7  Eosinophil 0.0 - 5.0 % 3.4  Basophil 0.0 - 3.0 % 0.6  NEUT# 1.4 - 7.7 K/uL 6.7  Lymphocyte # 0.7 - 4.0 K/uL 2.6  Monocyte # 0.1 - 1.0 K/uL 0.8  Eosinophils Absolute 0.0 - 0.7 K/uL 0.4  Basophils Absolute 0.0 - 0.1 K/uL 0.1    Latest Reference Range & Units 02/13/22 13:32  TSH 0.35 - 5.50 uIU/mL 1.53    ASSESSMENT / PLAN / RECOMMENDATIONS:   Hyperthyroidism:  Plan: ***    Medications   ***   Signed electronically by: Lyndle Herrlich, MD  [facility] Endocrinology  [facility] [address] [place] [place], [place] [address] Phone: [phone] FAX: [phone]      CC: Shirline Frees, NP [address] [place] [address] Phone: [phone]  Fax: [phone]   Return to Endocrinology clinic as below: Future Appointments  Date Time Provider Department Center  05/31/2022  9:10 AM Quanah Majka, Konrad Dolores, MD [facility] None  06/10/2022  3:00 PM Kathleene Hazel, MD [facility]  06/17/2022  1:45 PM [facility]AL WELLNESS VISIT [facility]  07/15/2022  1:00 PM Herring, Milas Kocher, RPH [facility] None

## 2022-05-31 ENCOUNTER — Ambulatory Visit: Payer: Medicare Other | Admitting: Internal Medicine

## 2022-06-06 ENCOUNTER — Telehealth: Payer: Self-pay

## 2022-06-06 NOTE — Progress Notes (Signed)
Care Management & Coordination Services Pharmacy Team  Reason for Encounter: Medication coordination and delivery  Contacted patient to discuss medications and coordinate delivery from [facility]. Spoke with patient on 06/06/2022   Cycle dispensing form sent to [facility] for review.   Last adherence delivery date:05/21/2022      Patient is due for next adherence delivery on: 06/19/2022  This delivery to include: Adherence Packaging  30 Days  Aspirin EC 81 mg: one tablet at dinner Furosemide 20 mg 1 tablet daily as needed Venlafaxine XR 150 mg: one capsule with breakfast Metoprolol succinate 50 MG 24 hr: one tablet at breakfast Losartan 50 mg: one tablet with breakfast Fluticasone 50 MCG/ACT nasal spray: Place 2 sprays into both nostrils daily Albuterol HFA : 2 puffs every 6 hours as needed Methimazole 10 mg: two tablets at dinner  Pantoprazole 40 mg: one tablet at breakfast Clopidogrel 75 mg: one tablet at breakfast  Atorvastatin 40 mg: one tablet at dinner Fenofibrate 145 mg: one tablet at dinner Ondansetron 8 mg: 1 tablet every 8 hours as needed for nausea or vomiting  Patient declined the following medications this month: None  Confirmed delivery date of 06/19/2022, advised patient that pharmacy will contact them the morning of delivery.  Any concerns about your medications? Patient denies  How often do you forget or accidentally miss a dose? Patient denies  Is patient in packaging Yes  If yes  What is the date on your next pill pack? 06/06/2022 PM  Any concerns or issues with your packaging? Patient denies  Recent blood pressure readings are as follows:  Patient denies    Chart review: Recent office visits:  05/16/2022 Shirline Frees NP - Patient was seen for left hand pain and an additional concern. Started Penicillin v potassium. Discontinued Augmentin.   Recent consult visits:  None  Hospital visits:  None  Medications: Outpatient Encounter  Medications as of 06/06/2022  Medication Sig   albuterol (VENTOLIN HFA) 108 (90 Base) MCG/ACT inhaler INHALE TWO PUFFS BY MOUTH INTO LUNGS every SIX hours AS NEEDED FOR WHEEZING AND/OR SHORTNESS OF BREATH   aspirin EC 81 MG tablet Take 1 tablet (81 mg total) by mouth daily. Swallow whole.   atorvastatin (LIPITOR) 40 MG tablet Take 1 tablet (40 mg total) by mouth every evening.   Blood Pressure Monitoring DEVI Use as needed DX I10   clopidogrel (PLAVIX) 75 MG tablet Take 1 tablet (75 mg total) by mouth daily.   fenofibrate (TRICOR) 145 MG tablet Take 1 tablet (145 mg total) by mouth daily.   fluticasone (FLONASE) 50 MCG/ACT nasal spray instill two SPRAYS in each nostril daily   furosemide (LASIX) 20 MG tablet TAKE ONE TABLET BY MOUTH ONCE daily AS NEEDED FOR LEG EDEMA   lidocaine (XYLOCAINE) 2 % solution Apply 5 ml  around gum of affected teeth up to 4 times per day.   losartan (COZAAR) 50 MG tablet TAKE ONE TABLET BY MOUTH EVERY MORNING   methimazole (TAPAZOLE) 10 MG tablet TAKE TWO TABLETS BY MOUTH EVERY EVENING   metoprolol succinate (TOPROL-XL) 50 MG 24 hr tablet Take 1 tablet (50 mg total) by mouth daily. Take with or immediately following a meal.   nicotine (NICODERM CQ - DOSED IN MG/24 HOURS) 14 mg/24hr patch Use for the 2nd 28 days   nicotine (NICODERM CQ - DOSED IN MG/24 HOURS) 21 mg/24hr patch Use for the first 28 days   nicotine (NICODERM CQ - DOSED IN MG/24 HR) 7 mg/24hr patch Use  last   nitroGLYCERIN (NITROSTAT) 0.4 MG SL tablet Place 1 tablet (0.4 mg total) under the tongue every 5 (five) minutes as needed for chest pain.   ondansetron (ZOFRAN-ODT) 8 MG disintegrating tablet TAKE ONE TABLET BY MOUTH every EIGHT hours AS NEEDED FOR NAUSEA AND VOMITING   oxyCODONE-acetaminophen (PERCOCET) 5-325 MG tablet Take 1-2 tablets by mouth every 4 (four) hours as needed for moderate pain.   pantoprazole (PROTONIX) 40 MG tablet TAKE ONE TABLET BY MOUTH EVERY MORNING   venlafaxine XR (EFFEXOR-XR)  150 MG 24 hr capsule TAKE ONE CAPSULE BY MOUTH EVERY MORNING   Vitamin D, Ergocalciferol, (DRISDOL) 1.25 MG (50000 UNIT) CAPS capsule Take 1 capsule (50,000 Units total) by mouth every 7 (seven) days.   No facility-administered encounter medications on file as of 06/06/2022.   BP Readings from Last 3 Encounters:  05/16/22 (!) 140/70  02/13/22 132/82  01/01/22 130/88    Pulse Readings from Last 3 Encounters:  05/16/22 75  02/13/22 60  01/01/22 70    Lab Results  Component Value Date/Time   HGBA1C 6.0 02/13/2022 01:32 PM   HGBA1C 5.4 09/13/2018 03:11 AM   Lab Results  Component Value Date   CREATININE 0.93 02/13/2022   BUN 19 02/13/2022   GFR 63.72 02/13/2022   GFRNONAA >60 03/29/2021   GFRAA 76 09/27/2019   NA 138 02/13/2022   K 3.9 02/13/2022   CALCIUM 9.5 02/13/2022   CO2 29 02/13/2022   Inetta Fermo CMA  Clinical Pharmacist Assistant [phone]

## 2022-06-10 ENCOUNTER — Ambulatory Visit: Payer: Medicare Other | Attending: Cardiovascular Disease | Admitting: Cardiovascular Disease

## 2022-06-10 VITALS — BP 128/74 | HR 71 | Ht 70.0 in | Wt 174.8 lb

## 2022-06-10 DIAGNOSIS — I25110 Atherosclerotic heart disease of native coronary artery with unstable angina pectoris: Secondary | ICD-10-CM | POA: Diagnosis not present

## 2022-06-10 DIAGNOSIS — Z01812 Encounter for preprocedural laboratory examination: Secondary | ICD-10-CM | POA: Diagnosis not present

## 2022-06-10 DIAGNOSIS — E785 Hyperlipidemia, unspecified: Secondary | ICD-10-CM

## 2022-06-10 DIAGNOSIS — Z720 Tobacco use: Secondary | ICD-10-CM | POA: Diagnosis not present

## 2022-06-10 DIAGNOSIS — I10 Essential (primary) hypertension: Secondary | ICD-10-CM | POA: Diagnosis not present

## 2022-06-10 NOTE — Patient Instructions (Signed)
Medication Instructions:  No changes *If you need a refill on your cardiac medications before your next appointment, please call your pharmacy*   Lab Work: Today: cbc, bmet    Testing/Procedures: Your physician has requested that you have a cardiac catheterization. Cardiac catheterization is used to diagnose and/or treat various heart conditions. Doctors may recommend this procedure for a number of different reasons. The most common reason is to evaluate chest pain. Chest pain can be a symptom of coronary artery disease (CAD), and cardiac catheterization can show whether plaque is narrowing or blocking your heart's arteries. This procedure is also used to evaluate the valves, as well as measure the blood flow and oxygen levels in different parts of your heart. For further information please visit [email or web address] Please follow instruction sheet, as given.   Follow-Up: At [facility], you and your health needs are our priority.  As part of our continuing mission to provide you with exceptional heart care, we have created designated Provider Care Teams.  These Care Teams include your primary Cardiologist (physician) and Advanced Practice Providers (APPs -  Physician Assistants and Nurse Practitioners) who all work together to provide you with the care you need, when you need it.   Your next appointment:   3 week(s)  Provider:   Advanced Practice Provider (PA-C or NP)       Cardiac/Peripheral Catheterization   You are scheduled for a Cardiac Catheterization on Thursday, May 9 with Dr. Verne Carrow.  1. Please arrive at the [facility] (Main Entrance A) at [facility]: [address] [place], [place] [address] at 7:00 AM (This time is TWO hour(s) before your procedure to ensure your preparation). Free valet parking service is available. You will check in at ADMITTING. The support person will be asked to wait in the waiting room.  It is OK to have someone drop  you off and come back when you are ready to be discharged.        Special note: Every effort is made to have your procedure done on time. Please understand that emergencies sometimes delay scheduled procedures.  2. Diet: Do not eat solid foods after midnight.  You may have clear liquids until 5 AM the day of the procedure.  3. Labs: You will need to have blood drawn today.  You do not need to be fasting.  4. Medication instructions in preparation for your procedure:   Contrast Allergy: No  On the morning of your procedure, take Aspirin 81 mg and Plavix/Clopidogrel and any morning medicines NOT listed above.  You may use sips of water.  5. Plan to go home the same day, you will only stay overnight if medically necessary. 6. You MUST have a responsible adult to drive you home. 7. An adult MUST be with you the first 24 hours after you arrive home. 8. Bring a current list of your medications, and the last time and date medication taken. 9. Bring ID and current insurance cards. 10.Please wear clothes that are easy to get on and off and wear slip-on shoes.  Thank you for allowing [place] to care for you!   -- [place] [facility]

## 2022-06-10 NOTE — H&P (View-Only) (Signed)
 Chief Complaint  Patient presents with   Follow-up    CAD   History of Present Illness: 67 yo female with history of CAD, HTN, HLD, anxiety, rheumatoid arthritis, COPD/asthma, esophageal stricture, GERD, irritable bowel syndrome, tobacco abuse and fibromyalgia who is here today for follow up. Echo in 2019 with LVEF 65%, mild MR. Cardiac cath in 2021 with severe mid Circumflex/OM stenosis treated with 2 drug eluting stents. Mild LAD disease. Moderate mid RCA stenosis. LV function normal. She had dyspnea on Brilinta and was changed to Plavix.  Echo August 2023 with LVEF=60-65%. No significant valve disease. She described leg pain with walking in 2023. Normal ABI 2023 but slightly abnormal waveforms. She was seen by Dr. Arida in [facility] and he did not feel that her leg pain was c/w claudication.   She is here today for follow up. She has had recent episodes of dizziness, dyspnea and chest pain with exertion. This is like her angina prior to her last stents. Her energy level is down. The patient denies any palpitations, orthopnea, PND. She continues to smoke.   Primary Care Physician: Nafziger, Cory, NP   Past Medical History:  Diagnosis Date   Allergy    Anxiety    Arthritis    RA   Asthma    Chronic back pain    COPD (chronic obstructive pulmonary disease) (HCC)    Depression    Esophageal stricture    GERD (gastroesophageal reflux disease)    Heart murmur    Hematemesis 09/10/2018   Hyperlipidemia    Hypertension    IBS (irritable bowel syndrome)    Interstitial cystitis    Neuromuscular disorder (HCC)    fibromyalgia   PONV (postoperative nausea and vomiting)     Past Surgical History:  Procedure Laterality Date   ABDOMINAL HYSTERECTOMY     APPENDECTOMY     BIOPSY  09/11/2018   Procedure: BIOPSY;  Surgeon: Mansouraty, Gabriel Jr., MD;  Location: [facility];  Service: Gastroenterology;;   CERVICAL FUSION     CERVICAL LAMINECTOMY     CORONARY STENT INTERVENTION N/A  10/07/2019   Procedure: CORONARY STENT INTERVENTION;  Surgeon: Kamorah Nevils D, MD;  [facility]: [facility];  Service: Cardiovascular;  Laterality: N/A;   ESOPHAGOGASTRODUODENOSCOPY     ESOPHAGOGASTRODUODENOSCOPY (EGD) WITH PROPOFOL N/A 09/11/2018   Procedure: ESOPHAGOGASTRODUODENOSCOPY (EGD) WITH PROPOFOL;  Surgeon: Mansouraty, Gabriel Jr., MD;  Location: [facility];  Service: Gastroenterology;  Laterality: N/A;   FINGER SURGERY     KYPHOPLASTY N/A 03/29/2021   Procedure: Lumbar Three KYPHOPLASTY;  Surgeon: Elsner, Henry, MD;  Location: [facility] OR;  Service: Neurosurgery;  Laterality: N/A;   LEFT HEART CATH AND CORONARY ANGIOGRAPHY N/A 10/07/2019   Procedure: LEFT HEART CATH AND CORONARY ANGIOGRAPHY;  Surgeon: Latifa Noble D, MD;  [facility];  Service: Cardiovascular;  Laterality: N/A;   LUMBAR FUSION     SAVORY DILATION N/A 09/11/2018   Procedure: SAVORY DILATION;  Surgeon: Mansouraty, Gabriel Jr., MD;  Location: [facility];  Service: Gastroenterology;  Laterality: N/A;   TONSILLECTOMY      Current Outpatient Medications  Medication Sig Dispense Refill   albuterol (VENTOLIN HFA) 108 (90 Base) MCG/ACT inhaler INHALE TWO PUFFS BY MOUTH INTO LUNGS every SIX hours AS NEEDED FOR WHEEZING AND/OR SHORTNESS OF BREATH 8.5 g 3   aspirin EC 81 MG tablet Take 1 tablet (81 mg total) by mouth daily. Swallow whole. 90 tablet 3   atorvastatin (LIPITOR) 40 MG tablet Take   1 tablet (40 mg total) by mouth every evening. 90 tablet 3   Blood Pressure Monitoring DEVI Use as needed DX I10 1 each 1   clopidogrel (PLAVIX) 75 MG tablet Take 1 tablet (75 mg total) by mouth daily. 90 tablet 3   fenofibrate (TRICOR) 145 MG tablet Take 1 tablet (145 mg total) by mouth daily. 90 tablet 3   fluticasone (FLONASE) 50 MCG/ACT nasal spray instill two SPRAYS in each nostril daily 16 g 6   furosemide (LASIX) 20 MG tablet TAKE ONE TABLET BY MOUTH ONCE daily AS NEEDED FOR LEG EDEMA 30 tablet 3    lidocaine (XYLOCAINE) 2 % solution Apply 5 ml  around gum of affected teeth up to 4 times per day. 100 mL 0   losartan (COZAAR) 50 MG tablet TAKE ONE TABLET BY MOUTH EVERY MORNING 90 tablet 1   methimazole (TAPAZOLE) 10 MG tablet TAKE TWO TABLETS BY MOUTH EVERY EVENING 60 tablet 0   metoprolol succinate (TOPROL-XL) 50 MG 24 hr tablet Take 1 tablet (50 mg total) by mouth daily. Take with or immediately following a meal. 90 tablet 1   ondansetron (ZOFRAN-ODT) 8 MG disintegrating tablet TAKE ONE TABLET BY MOUTH every EIGHT hours AS NEEDED FOR NAUSEA AND VOMITING 20 tablet 3   pantoprazole (PROTONIX) 40 MG tablet TAKE ONE TABLET BY MOUTH EVERY MORNING 90 tablet 3   venlafaxine XR (EFFEXOR-XR) 150 MG 24 hr capsule TAKE ONE CAPSULE BY MOUTH EVERY MORNING 90 capsule 1   Vitamin D, Ergocalciferol, (DRISDOL) 1.25 MG (50000 UNIT) CAPS capsule Take 1 capsule (50,000 Units total) by mouth every 7 (seven) days. 25 capsule 0   nitroGLYCERIN (NITROSTAT) 0.4 MG SL tablet Place 1 tablet (0.4 mg total) under the tongue every 5 (five) minutes as needed for chest pain. 25 tablet 1   No current facility-administered medications for this visit.    Allergies  Allergen Reactions   Codeine Nausea And Vomiting   Lisinopril Cough   Sulfonamide Derivatives Nausea And Vomiting   Tetracyclines & Related Rash    Social History   Socioeconomic History   Marital status: Single    Spouse name: Not on file   Number of children: Not on file   Years of education: Not on file   Highest education level: Not on file  Occupational History   Occupation: disability  Tobacco Use   Smoking status: Every Day    Packs/day: .5    Types: Cigarettes   Smokeless tobacco: Never   Tobacco comments:    trying to quit   Vaping Use   Vaping Use: Never used  Substance and Sexual Activity   Alcohol use: Never    Comment: occasional   Drug use: Yes    Types: Marijuana    Comment: occas   Sexual activity: Not on file  Other  Topics Concern   Not on file  Social History Narrative   Not on file   Social Determinants of Health   Financial Resource Strain: Low Risk  (09/21/2021)   Overall Financial Resource Strain (CARDIA)    Difficulty of Paying Living Expenses: Not very hard  Food Insecurity: No Food Insecurity (12/03/2021)   Hunger Vital Sign    Worried About Running Out of Food in the Last Year: Never true    Ran Out of Food in the Last Year: Never true  Transportation Needs: No Transportation Needs (12/03/2021)   PRAPARE - Transportation    Lack of Transportation (Medical): No    Lack   of Transportation (Non-Medical): No  Physical Activity: Insufficiently Active (06/13/2021)   Exercise Vital Sign    Days of Exercise per Week: 3 days    Minutes of Exercise per Session: 10 min  Stress: No Stress Concern Present (06/13/2021)   [employer] - Occupational Stress Questionnaire    Feeling of Stress : Only a little  Social Connections: Moderately Integrated (06/13/2021)   Social Connection and Isolation Panel [NHANES]    Frequency of Communication with Friends and Family: More than three times a week    Frequency of Social Gatherings with Friends and Family: More than three times a week    Attends Religious Services: More than 4 times per year    Active Member of Clubs or Organizations: Yes    Attends Club or Organization Meetings: More than 4 times per year    Marital Status: Divorced  Intimate Partner Violence: Not At Risk (11/22/2021)   Humiliation, Afraid, Rape, and Kick questionnaire    Fear of Current or Ex-Partner: No    Emotionally Abused: No    Physically Abused: No    Sexually Abused: No    Family History  Problem Relation Age of Onset   Dementia Mother    Heart attack Father    Coronary artery disease Father    Cancer Brother        esophageal   Esophageal cancer Brother    Coronary artery disease Paternal Aunt    Stomach cancer Paternal Aunt    Coronary artery  disease Paternal Grandmother    Aneurysm Brother        aortic   Rectal cancer Neg Hx    Colon cancer Neg Hx    Thyroid disease Neg Hx     Review of Systems:  As stated in the HPI and otherwise negative.   BP 128/74 (BP Location: Right Arm, Patient Position: Sitting, Cuff Size: Normal)   Pulse 71   Ht 5' 10" (1.778 m)   Wt 79.3 kg   SpO2 96%   BMI 25.08 kg/m   Physical Examination: General: Well developed, well nourished, NAD  HEENT: OP clear, mucus membranes moist  SKIN: warm, dry. No rashes. Neuro: No focal deficits  Musculoskeletal: Muscle strength 5/5 all ext  Psychiatric: Mood and affect normal  Neck: No JVD, no carotid bruits, no thyromegaly, no lymphadenopathy.  Lungs:Clear bilaterally, no wheezes, rhonci, crackles Cardiovascular: Regular rate and rhythm. No murmurs, gallops or rubs. Abdomen:Soft. Bowel sounds present. Non-tender.  Extremities: No lower extremity edema. Pulses are 2 + in the bilateral DP/PT.  EKG:  EKG is  ordered today. The ekg ordered today demonstrates Sinus brady, rate 58 bpm. Non-specific ST and T wave abn  Echo August 2023:  1. Left ventricular ejection fraction, by estimation, is 60 to 65%. The  left ventricle has normal function. The left ventricle has no regional  wall motion abnormalities. There is mild left ventricular hypertrophy.  Left ventricular diastolic parameters  were normal.   2. Right ventricular systolic function is normal. The right ventricular  size is normal. There is normal pulmonary artery systolic pressure. The  estimated right ventricular systolic pressure is 19.5 mmHg.   3. The mitral valve is normal in structure. Trivial mitral valve  regurgitation. No evidence of mitral stenosis.   4. The aortic valve is tricuspid. Aortic valve regurgitation is trivial.  No aortic stenosis is present.   5. The inferior vena cava is normal in size with greater than 50%  respiratory   variability, suggesting right atrial pressure of 3  mmHg.   Recent Labs: 02/13/2022: ALT 12; BUN 19; Creatinine, Ser 0.93; Hemoglobin 12.2; Platelets 344.0; Potassium 3.9; Sodium 138; TSH 1.53   Lipid Panel    Component Value Date/Time   CHOL 120 02/13/2022 1332   CHOL 137 10/08/2021 0000   TRIG 88.0 02/13/2022 1332   HDL 43.50 02/13/2022 1332   HDL 39 (L) 10/08/2021 0000   CHOLHDL 3 02/13/2022 1332   VLDL 17.6 02/13/2022 1332   LDLCALC 59 02/13/2022 1332   LDLCALC 60 10/08/2021 0000   LDLCALC 81 09/03/2019 1353   LDLDIRECT 123.0 03/14/2017 0948     Wt Readings from Last 3 Encounters:  06/10/22 79.3 kg  05/16/22 80.7 kg  02/13/22 77.6 kg    Assessment and Plan:   1. CAD with unstable angina: Chest pain concerning for unstable angina. Will plan cardiac cath at [facility] 06/20/22 at 9am. Will continue ASA, Plavix, statin and beta blocker.   I have reviewed the risks, indications, and alternatives to cardiac catheterization, possible angioplasty, and stenting with the patient. Risks include but are not limited to bleeding, infection, vascular injury, stroke, myocardial infection, arrhythmia, kidney injury, radiation-related injury in the case of prolonged fluoroscopy use, emergency cardiac surgery, and death. The patient understands the risks of serious complication is 1-2 in 1000 with diagnostic cardiac cath and 1-2% or less with angioplasty/stenting.   2. HTN: BP is well controlled. No changes today  3. HLD: LDL 59 in January 2024. Continue statin  4. Tobacco abuse: Smoking cessation recommended  Labs/ tests ordered today include:   Orders Placed This Encounter  Procedures   Basic metabolic panel   CBC   EKG 12-Lead   Disposition:   F/U with me in one year.    Signed, Sidonie Dexheimer, MD 06/10/2022 3:59 PM    [facility] [address], [place], [place]  [address] Phone: [phone]; Fax: [phone]   

## 2022-06-10 NOTE — Progress Notes (Signed)
Chief Complaint  Patient presents with   Follow-up    CAD   History of Present Illness: 67 yo female with history of CAD, HTN, HLD, anxiety, rheumatoid arthritis, COPD/asthma, esophageal stricture, GERD, irritable bowel syndrome, tobacco abuse and fibromyalgia who is here today for follow up. Echo in 2019 with LVEF 65%, mild MR. Cardiac cath in 2021 with severe mid Circumflex/OM stenosis treated with 2 drug eluting stents. Mild LAD disease. Moderate mid RCA stenosis. LV function normal. She had dyspnea on Brilinta and was changed to Plavix.  Echo August 2023 with LVEF=60-65%. No significant valve disease. She described leg pain with walking in 2023. Normal ABI 2023 but slightly abnormal waveforms. She was seen by Dr. Kirke Corin in [facility] clinic and he did not feel that her leg pain was c/w claudication.   She is here today for follow up. She has had recent episodes of dizziness, dyspnea and chest pain with exertion. This is like her angina prior to her last stents. Her energy level is down. The patient denies any palpitations, orthopnea, PND. She continues to smoke.   Primary Care Physician: Shirline Frees, NP   Past Medical History:  Diagnosis Date   Allergy    Anxiety    Arthritis    RA   Asthma    Chronic back pain    COPD (chronic obstructive pulmonary disease) (HCC)    Depression    Esophageal stricture    GERD (gastroesophageal reflux disease)    Heart murmur    Hematemesis 09/10/2018   Hyperlipidemia    Hypertension    IBS (irritable bowel syndrome)    Interstitial cystitis    Neuromuscular disorder (HCC)    fibromyalgia   PONV (postoperative nausea and vomiting)     Past Surgical History:  Procedure Laterality Date   ABDOMINAL HYSTERECTOMY     APPENDECTOMY     BIOPSY  09/11/2018   Procedure: BIOPSY;  Surgeon: Lemar Lofty., MD;  Location: [facility] ENDOSCOPY;  Service: Gastroenterology;;   CERVICAL FUSION     CERVICAL LAMINECTOMY     CORONARY STENT INTERVENTION N/A  10/07/2019   Procedure: CORONARY STENT INTERVENTION;  Surgeon: Kathleene Hazel, MD;  [facility];  Service: Cardiovascular;  Laterality: N/A;   ESOPHAGOGASTRODUODENOSCOPY     ESOPHAGOGASTRODUODENOSCOPY (EGD) WITH PROPOFOL N/A 09/11/2018   Procedure: ESOPHAGOGASTRODUODENOSCOPY (EGD) WITH PROPOFOL;  Surgeon: Meridee Score Netty Starring., MD;  Location: [facility] ENDOSCOPY;  Service: Gastroenterology;  Laterality: N/A;   FINGER SURGERY     KYPHOPLASTY N/A 03/29/2021   Procedure: Lumbar Three KYPHOPLASTY;  Surgeon: Barnett Abu, MD;  Location: [facility] OR;  Service: Neurosurgery;  Laterality: N/A;   LEFT HEART CATH AND CORONARY ANGIOGRAPHY N/A 10/07/2019   Procedure: LEFT HEART CATH AND CORONARY ANGIOGRAPHY;  Surgeon: Kathleene Hazel, MD;  [facility]: [facility];  Service: Cardiovascular;  Laterality: N/A;   LUMBAR FUSION     SAVORY DILATION N/A 09/11/2018   Procedure: SAVORY DILATION;  Surgeon: Meridee Score Netty Starring., MD;  Location: [facility] ENDOSCOPY;  Service: Gastroenterology;  Laterality: N/A;   TONSILLECTOMY      Current Outpatient Medications  Medication Sig Dispense Refill   albuterol (VENTOLIN HFA) 108 (90 Base) MCG/ACT inhaler INHALE TWO PUFFS BY MOUTH INTO LUNGS every SIX hours AS NEEDED FOR WHEEZING AND/OR SHORTNESS OF BREATH 8.5 g 3   aspirin EC 81 MG tablet Take 1 tablet (81 mg total) by mouth daily. Swallow whole. 90 tablet 3   atorvastatin (LIPITOR) 40 MG tablet Take  1 tablet (40 mg total) by mouth every evening. 90 tablet 3   Blood Pressure Monitoring DEVI Use as needed DX I10 1 each 1   clopidogrel (PLAVIX) 75 MG tablet Take 1 tablet (75 mg total) by mouth daily. 90 tablet 3   fenofibrate (TRICOR) 145 MG tablet Take 1 tablet (145 mg total) by mouth daily. 90 tablet 3   fluticasone (FLONASE) 50 MCG/ACT nasal spray instill two SPRAYS in each nostril daily 16 g 6   furosemide (LASIX) 20 MG tablet TAKE ONE TABLET BY MOUTH ONCE daily AS NEEDED FOR LEG EDEMA 30 tablet 3    lidocaine (XYLOCAINE) 2 % solution Apply 5 ml  around gum of affected teeth up to 4 times per day. 100 mL 0   losartan (COZAAR) 50 MG tablet TAKE ONE TABLET BY MOUTH EVERY MORNING 90 tablet 1   methimazole (TAPAZOLE) 10 MG tablet TAKE TWO TABLETS BY MOUTH EVERY EVENING 60 tablet 0   metoprolol succinate (TOPROL-XL) 50 MG 24 hr tablet Take 1 tablet (50 mg total) by mouth daily. Take with or immediately following a meal. 90 tablet 1   ondansetron (ZOFRAN-ODT) 8 MG disintegrating tablet TAKE ONE TABLET BY MOUTH every EIGHT hours AS NEEDED FOR NAUSEA AND VOMITING 20 tablet 3   pantoprazole (PROTONIX) 40 MG tablet TAKE ONE TABLET BY MOUTH EVERY MORNING 90 tablet 3   venlafaxine XR (EFFEXOR-XR) 150 MG 24 hr capsule TAKE ONE CAPSULE BY MOUTH EVERY MORNING 90 capsule 1   Vitamin D, Ergocalciferol, (DRISDOL) 1.25 MG (50000 UNIT) CAPS capsule Take 1 capsule (50,000 Units total) by mouth every 7 (seven) days. 25 capsule 0   nitroGLYCERIN (NITROSTAT) 0.4 MG SL tablet Place 1 tablet (0.4 mg total) under the tongue every 5 (five) minutes as needed for chest pain. 25 tablet 1   No current facility-administered medications for this visit.    Allergies  Allergen Reactions   Codeine Nausea And Vomiting   Lisinopril Cough   Sulfonamide Derivatives Nausea And Vomiting   Tetracyclines & Related Rash    Social History   Socioeconomic History   Marital status: Single    Spouse name: Not on file   Number of children: Not on file   Years of education: Not on file   Highest education level: Not on file  Occupational History   Occupation: disability  Tobacco Use   Smoking status: Every Day    Packs/day: .5    Types: Cigarettes   Smokeless tobacco: Never   Tobacco comments:    trying to quit   Vaping Use   Vaping Use: Never used  Substance and Sexual Activity   Alcohol use: Never    Comment: occasional   Drug use: Yes    Types: Marijuana    Comment: occas   Sexual activity: Not on file  Other  Topics Concern   Not on file  Social History Narrative   Not on file   Social Determinants of Health   Financial Resource Strain: Low Risk  (09/21/2021)   Overall Financial Resource Strain (CARDIA)    Difficulty of Paying Living Expenses: Not very hard  Food Insecurity: No Food Insecurity (12/03/2021)   Hunger Vital Sign    Worried About Running Out of Food in the Last Year: Never true    Ran Out of Food in the Last Year: Never true  Transportation Needs: No Transportation Needs (12/03/2021)   PRAPARE - Transportation    Lack of Transportation (Medical): No    Lack  of Transportation (Non-Medical): No  Physical Activity: Insufficiently Active (06/13/2021)   Exercise Vital Sign    Days of Exercise per Week: 3 days    Minutes of Exercise per Session: 10 min  Stress: No Stress Concern Present (06/13/2021)   [employer] of Occupational Health - Occupational Stress Questionnaire    Feeling of Stress : Only a little  Social Connections: Moderately Integrated (06/13/2021)   Social Connection and Isolation Panel [NHANES]    Frequency of Communication with Friends and Family: More than three times a week    Frequency of Social Gatherings with Friends and Family: More than three times a week    Attends Religious Services: More than 4 times per year    Active Member of [employer] or Organizations: Yes    Attends Engineer, structural: More than 4 times per year    Marital Status: Divorced  Intimate Partner Violence: Not At Risk (11/22/2021)   Humiliation, Afraid, Rape, and Kick questionnaire    Fear of Current or Ex-Partner: No    Emotionally Abused: No    Physically Abused: No    Sexually Abused: No    Family History  Problem Relation Age of Onset   Dementia Mother    Heart attack Father    Coronary artery disease Father    Cancer Brother        esophageal   Esophageal cancer Brother    Coronary artery disease Paternal Aunt    Stomach cancer Paternal Aunt    Coronary artery  disease Paternal Grandmother    Aneurysm Brother        aortic   Rectal cancer Neg Hx    Colon cancer Neg Hx    Thyroid disease Neg Hx     Review of Systems:  As stated in the HPI and otherwise negative.   BP 128/74 (BP Location: Right Arm, Patient Position: Sitting, Cuff Size: Normal)   Pulse 71   Ht 5\' 10"  (1.778 m)   Wt 79.3 kg   SpO2 96%   BMI 25.08 kg/m   Physical Examination: General: Well developed, well nourished, NAD  HEENT: OP clear, mucus membranes moist  SKIN: warm, dry. No rashes. Neuro: No focal deficits  Musculoskeletal: Muscle strength 5/5 all ext  Psychiatric: Mood and affect normal  Neck: No JVD, no carotid bruits, no thyromegaly, no lymphadenopathy.  Lungs:Clear bilaterally, no wheezes, rhonci, crackles Cardiovascular: Regular rate and rhythm. No murmurs, gallops or rubs. Abdomen:Soft. Bowel sounds present. Non-tender.  Extremities: No lower extremity edema. Pulses are 2 + in the bilateral DP/PT.  EKG:  EKG is  ordered today. The ekg ordered today demonstrates Sinus brady, rate 58 bpm. Non-specific ST and T wave abn  Echo August 2023:  1. Left ventricular ejection fraction, by estimation, is 60 to 65%. The  left ventricle has normal function. The left ventricle has no regional  wall motion abnormalities. There is mild left ventricular hypertrophy.  Left ventricular diastolic parameters  were normal.   2. Right ventricular systolic function is normal. The right ventricular  size is normal. There is normal pulmonary artery systolic pressure. The  estimated right ventricular systolic pressure is 19.5 mmHg.   3. The mitral valve is normal in structure. Trivial mitral valve  regurgitation. No evidence of mitral stenosis.   4. The aortic valve is tricuspid. Aortic valve regurgitation is trivial.  No aortic stenosis is present.   5. The inferior vena cava is normal in size with greater than 50%  respiratory  variability, suggesting right atrial pressure of 3  mmHg.   Recent Labs: 02/13/2022: ALT 12; BUN 19; Creatinine, Ser 0.93; Hemoglobin 12.2; Platelets 344.0; Potassium 3.9; Sodium 138; TSH 1.53   Lipid Panel    Component Value Date/Time   CHOL 120 02/13/2022 1332   CHOL 137 10/08/2021 0000   TRIG 88.0 02/13/2022 1332   HDL 43.50 02/13/2022 1332   HDL 39 (L) 10/08/2021 0000   CHOLHDL 3 02/13/2022 1332   VLDL 17.6 02/13/2022 1332   LDLCALC 59 02/13/2022 1332   LDLCALC 60 10/08/2021 0000   LDLCALC 81 09/03/2019 1353   LDLDIRECT 123.0 03/14/2017 0948     Wt Readings from Last 3 Encounters:  06/10/22 79.3 kg  05/16/22 80.7 kg  02/13/22 77.6 kg    Assessment and Plan:   1. CAD with unstable angina: Chest pain concerning for unstable angina. Will plan cardiac cath at [facility] 06/20/22 at 9am. Will continue ASA, Plavix, statin and beta blocker.   I have reviewed the risks, indications, and alternatives to cardiac catheterization, possible angioplasty, and stenting with the patient. Risks include but are not limited to bleeding, infection, vascular injury, stroke, myocardial infection, arrhythmia, kidney injury, radiation-related injury in the case of prolonged fluoroscopy use, emergency cardiac surgery, and death. The patient understands the risks of serious complication is 1-2 in 1000 with diagnostic cardiac cath and 1-2% or less with angioplasty/stenting.   2. HTN: BP is well controlled. No changes today  3. HLD: LDL 59 in January 2024. Continue statin  4. Tobacco abuse: Smoking cessation recommended  Labs/ tests ordered today include:   Orders Placed This Encounter  Procedures   Basic metabolic panel   CBC   EKG 12-Lead   Disposition:   F/U with me in one year.    Signed, Verne Carrow, MD 06/10/2022 3:59 PM    [facility] [address] [place], [place], [place]  [address] Phone: [phone]; Fax: [phone]

## 2022-06-11 ENCOUNTER — Ambulatory Visit: Payer: Medicare Other | Admitting: "Endocrinology

## 2022-06-11 LAB — CBC
Hematocrit: 38.1 % (ref 34.0–46.6)
Hemoglobin: 12.4 g/dL (ref 11.1–15.9)
MCH: 28 pg (ref 26.6–33.0)
MCHC: 32.5 g/dL (ref 31.5–35.7)
MCV: 86 fL (ref 79–97)
Platelets: 316 10*3/uL (ref 150–450)
RBC: 4.43 x10E6/uL (ref 3.77–5.28)
RDW: 13 % (ref 11.7–15.4)
WBC: 9.2 10*3/uL (ref 3.4–10.8)

## 2022-06-11 LAB — BASIC METABOLIC PANEL
BUN/Creatinine Ratio: 18 (ref 12–28)
BUN: 22 mg/dL (ref 8–27)
CO2: 23 mmol/L (ref 20–29)
Calcium: 9.3 mg/dL (ref 8.7–10.3)
Chloride: 102 mmol/L (ref 96–106)
Creatinine, Ser: 1.25 mg/dL — ABNORMAL HIGH (ref 0.57–1.00)
Glucose: 84 mg/dL (ref 70–99)
Potassium: 4.5 mmol/L (ref 3.5–5.2)
Sodium: 142 mmol/L (ref 134–144)
eGFR: 47 mL/min/{1.73_m2} — ABNORMAL LOW (ref >59)

## 2022-06-17 ENCOUNTER — Other Ambulatory Visit: Payer: Self-pay | Admitting: Adult Health

## 2022-06-17 ENCOUNTER — Other Ambulatory Visit: Payer: Self-pay | Admitting: Internal Medicine

## 2022-06-17 ENCOUNTER — Ambulatory Visit (INDEPENDENT_AMBULATORY_CARE_PROVIDER_SITE_OTHER): Payer: Medicare Other

## 2022-06-17 VITALS — Ht 70.0 in | Wt 174.0 lb

## 2022-06-17 DIAGNOSIS — Z122 Encounter for screening for malignant neoplasm of respiratory organs: Secondary | ICD-10-CM | POA: Diagnosis not present

## 2022-06-17 DIAGNOSIS — Z0000 Encounter for general adult medical examination without abnormal findings: Secondary | ICD-10-CM | POA: Diagnosis not present

## 2022-06-17 DIAGNOSIS — Z1211 Encounter for screening for malignant neoplasm of colon: Secondary | ICD-10-CM

## 2022-06-17 NOTE — Patient Instructions (Addendum)
Laurie [name] , Thank you for taking time to come for your Medicare Wellness Visit. I appreciate your ongoing commitment to your health goals. Please review the following plan we discussed and let me know if I can assist you in the future.   These are the goals we discussed:  Goals       Increase physical activity (pt-stated)      I want to exercise more and quit smoking.        This is a list of the screening recommended for you and due dates:  Health Maintenance  Topic Date Due   DEXA scan (bone density measurement)  Never done   COVID-19 Vaccine (2 - 2023-24 season) 07/03/2022*   Zoster (Shingles) Vaccine (1 of 2) 09/17/2022*   Colon Cancer Screening  06/17/2023*   Flu Shot  09/12/2022   Mammogram  10/18/2022   Medicare Annual Wellness Visit  06/17/2023   DTaP/Tdap/Td vaccine (3 - Td or Tdap) 12/02/2031   Pneumonia Vaccine  Completed   Hepatitis C Screening: USPSTF Recommendation to screen - Ages 70-79 yo.  Completed   HPV Vaccine  Aged Out  *Topic was postponed. The date shown is not the original due date.    Advanced directives: Advance directive discussed with you today. Even though you declined this today, please call our office should you change your mind, and we can give you the proper paperwork for you to fill out.   Conditions/risks identified: None  Next appointment: Follow up in one year for your annual wellness visit    Preventive Care 65 Years and Older, Female Preventive care refers to lifestyle choices and visits with your health care provider that can promote health and wellness. What does preventive care include? A yearly physical exam. This is also called an annual well check. Dental exams once or twice a year. Routine eye exams. Ask your health care provider how often you should have your eyes checked. Personal lifestyle choices, including: Daily care of your teeth and gums. Regular physical activity. Eating a healthy diet. Avoiding tobacco and drug  use. Limiting alcohol use. Practicing safe sex. Taking low-dose aspirin every day. Taking vitamin and mineral supplements as recommended by your health care provider. What happens during an annual well check? The services and screenings done by your health care provider during your annual well check will depend on your age, overall health, lifestyle risk factors, and family history of disease. Counseling  Your health care provider may ask you questions about your: Alcohol use. Tobacco use. Drug use. Emotional well-being. Home and relationship well-being. Sexual activity. Eating habits. History of falls. Memory and ability to understand (cognition). Work and work Astronomer. Reproductive health. Screening  You may have the following tests or measurements: Height, weight, and BMI. Blood pressure. Lipid and cholesterol levels. These may be checked every 5 years, or more frequently if you are over 36 years old. Skin check. Lung cancer screening. You may have this screening every year starting at age 75 if you have a 30-pack-year history of smoking and currently smoke or have quit within the past 15 years. Fecal occult blood test (FOBT) of the stool. You may have this test every year starting at age 7. Flexible sigmoidoscopy or colonoscopy. You may have a sigmoidoscopy every 5 years or a colonoscopy every 10 years starting at age 18. Hepatitis C blood test. Hepatitis B blood test. Sexually transmitted disease (STD) testing. Diabetes screening. This is done by checking your blood sugar (glucose) after you  have not eaten for a while (fasting). You may have this done every 1-3 years. Bone density scan. This is done to screen for osteoporosis. You may have this done starting at age 75. Mammogram. This may be done every 1-2 years. Talk to your health care provider about how often you should have regular mammograms. Talk with your health care provider about your test results, treatment  options, and if necessary, the need for more tests. Vaccines  Your health care provider may recommend certain vaccines, such as: Influenza vaccine. This is recommended every year. Tetanus, diphtheria, and acellular pertussis (Tdap, Td) vaccine. You may need a Td booster every 10 years. Zoster vaccine. You may need this after age 80. Pneumococcal 13-valent conjugate (PCV13) vaccine. One dose is recommended after age 42. Pneumococcal polysaccharide (PPSV23) vaccine. One dose is recommended after age 61. Talk to your health care provider about which screenings and vaccines you need and how often you need them. This information is not intended to replace advice given to you by your health care provider. Make sure you discuss any questions you have with your health care provider. Document Released: 02/24/2015 Document Revised: 10/18/2015 Document Reviewed: 11/29/2014 Elsevier Interactive Patient Education  2017 [employer] Prevention in the Home Falls can cause injuries. They can happen to people of all ages. There are many things you can do to make your home safe and to help prevent falls. What can I do on the outside of my home? Regularly fix the edges of walkways and driveways and fix any cracks. Remove anything that might make you trip as you walk through a door, such as a raised step or threshold. Trim any bushes or trees on the path to your home. Use bright outdoor lighting. Clear any walking paths of anything that might make someone trip, such as rocks or tools. Regularly check to see if handrails are loose or broken. Make sure that both sides of any steps have handrails. Any raised decks and porches should have guardrails on the edges. Have any leaves, snow, or ice cleared regularly. Use sand or salt on walking paths during winter. Clean up any spills in your garage right away. This includes oil or grease spills. What can I do in the bathroom? Use night lights. Install grab  bars by the toilet and in the tub and shower. Do not use towel bars as grab bars. Use non-skid mats or decals in the tub or shower. If you need to sit down in the shower, use a plastic, non-slip stool. Keep the floor dry. Clean up any water that spills on the floor as soon as it happens. Remove soap buildup in the tub or shower regularly. Attach bath mats securely with double-sided non-slip rug tape. Do not have throw rugs and other things on the floor that can make you trip. What can I do in the bedroom? Use night lights. Make sure that you have a light by your bed that is easy to reach. Do not use any sheets or blankets that are too big for your bed. They should not hang down onto the floor. Have a firm chair that has side arms. You can use this for support while you get dressed. Do not have throw rugs and other things on the floor that can make you trip. What can I do in the kitchen? Clean up any spills right away. Avoid walking on wet floors. Keep items that you use a lot in easy-to-reach places. If you need  to reach something above you, use a strong step stool that has a grab bar. Keep electrical cords out of the way. Do not use floor polish or wax that makes floors slippery. If you must use wax, use non-skid floor wax. Do not have throw rugs and other things on the floor that can make you trip. What can I do with my stairs? Do not leave any items on the stairs. Make sure that there are handrails on both sides of the stairs and use them. Fix handrails that are broken or loose. Make sure that handrails are as long as the stairways. Check any carpeting to make sure that it is firmly attached to the stairs. Fix any carpet that is loose or worn. Avoid having throw rugs at the top or bottom of the stairs. If you do have throw rugs, attach them to the floor with carpet tape. Make sure that you have a light switch at the top of the stairs and the bottom of the stairs. If you do not have them,  ask someone to add them for you. What else can I do to help prevent falls? Wear shoes that: Do not have high heels. Have rubber bottoms. Are comfortable and fit you well. Are closed at the toe. Do not wear sandals. If you use a stepladder: Make sure that it is fully opened. Do not climb a closed stepladder. Make sure that both sides of the stepladder are locked into place. Ask someone to hold it for you, if possible. Clearly mark and make sure that you can see: Any grab bars or handrails. First and last steps. Where the edge of each step is. Use tools that help you move around (mobility aids) if they are needed. These include: Canes. Walkers. Scooters. Crutches. Turn on the lights when you go into a dark area. Replace any light bulbs as soon as they burn out. Set up your furniture so you have a clear path. Avoid moving your furniture around. If any of your floors are uneven, fix them. If there are any pets around you, be aware of where they are. Review your medicines with your doctor. Some medicines can make you feel dizzy. This can increase your chance of falling. Ask your doctor what other things that you can do to help prevent falls. This information is not intended to replace advice given to you by your health care provider. Make sure you discuss any questions you have with your health care provider. Document Released: 11/24/2008 Document Revised: 07/06/2015 Document Reviewed: 03/04/2014 Elsevier Interactive Patient Education  2017 [employer].

## 2022-06-17 NOTE — Progress Notes (Signed)
Subjective:   Laurie [name] is a 67 y.o. female who presents for Medicare Annual (Subsequent) preventive examination.  Review of Systems   Virtual Visit via Telephone Note  I connected with  [name] on 06/17/22 at  1:45 PM EDT by telephone and verified that I am speaking with the correct person using two identifiers.  Location: Patient: Home Provider: Office Persons participating in the virtual visit: patient/Nurse Health Advisor   I discussed the limitations, risks, security and privacy concerns of performing an evaluation and management service by telephone and the availability of in person appointments. The patient expressed understanding and agreed to proceed.  Interactive audio and video telecommunications were attempted between this nurse and patient, however failed, due to patient having technical difficulties OR patient did not have access to video capability.  We continued and completed visit with audio only.  Some vital signs may be absent or patient reported.   [name], LPN  Cardiac Risk Factors include: advanced age (>26men, >25 women);hypertension     Objective:    Today's Vitals   06/17/22 1351  Weight: 174 lb (78.9 kg)  Height: 5\' 10"  (1.778 m)   Body mass index is 24.97 kg/m.     06/17/2022    2:00 PM 12/01/2021    1:21 PM 06/13/2021    1:40 PM 03/29/2021    1:29 PM 12/30/2020    6:21 PM 10/07/2019    7:19 AM 08/29/2019   10:42 PM  Advanced Directives  Does Patient Have a Medical Advance Directive? No No No No No No No  Would patient like information on creating a medical advance directive? No - Patient declined No - Patient declined No - Patient declined No - Patient declined  No - Patient declined     Current Medications (verified) Outpatient Encounter Medications as of 06/17/2022  Medication Sig   albuterol (VENTOLIN HFA) 108 (90 Base) MCG/ACT inhaler INHALE TWO PUFFS BY MOUTH INTO LUNGS every SIX hours AS NEEDED FOR WHEEZING  AND/OR SHORTNESS OF BREATH   aspirin EC 81 MG tablet Take 1 tablet (81 mg total) by mouth daily. Swallow whole.   atorvastatin (LIPITOR) 40 MG tablet Take 1 tablet (40 mg total) by mouth every evening.   Blood Pressure Monitoring DEVI Use as needed DX I10   clopidogrel (PLAVIX) 75 MG tablet Take 1 tablet (75 mg total) by mouth daily.   fenofibrate (TRICOR) 145 MG tablet Take 1 tablet (145 mg total) by mouth daily.   fluticasone (FLONASE) 50 MCG/ACT nasal spray instill two SPRAYS in each nostril daily   furosemide (LASIX) 20 MG tablet TAKE ONE TABLET BY MOUTH ONCE daily AS NEEDED FOR LEG EDEMA   lidocaine (XYLOCAINE) 2 % solution Apply 5 ml  around gum of affected teeth up to 4 times per day.   losartan (COZAAR) 50 MG tablet TAKE ONE TABLET BY MOUTH EVERY MORNING   methimazole (TAPAZOLE) 10 MG tablet TAKE TWO TABLETS BY MOUTH EVERY EVENING   metoprolol succinate (TOPROL-XL) 50 MG 24 hr tablet Take 1 tablet (50 mg total) by mouth daily. Take with or immediately following a meal.   nitroGLYCERIN (NITROSTAT) 0.4 MG SL tablet Place 1 tablet (0.4 mg total) under the tongue every 5 (five) minutes as needed for chest pain.   ondansetron (ZOFRAN-ODT) 8 MG disintegrating tablet TAKE ONE TABLET BY MOUTH every EIGHT hours AS NEEDED FOR NAUSEA AND VOMITING   pantoprazole (PROTONIX) 40 MG tablet TAKE ONE TABLET BY MOUTH EVERY MORNING  venlafaxine XR (EFFEXOR-XR) 150 MG 24 hr capsule TAKE ONE CAPSULE BY MOUTH EVERY MORNING   Vitamin D, Ergocalciferol, (DRISDOL) 1.25 MG (50000 UNIT) CAPS capsule Take 1 capsule (50,000 Units total) by mouth every 7 (seven) days.   [DISCONTINUED] albuterol (VENTOLIN HFA) 108 (90 Base) MCG/ACT inhaler INHALE TWO PUFFS BY MOUTH INTO LUNGS every SIX hours AS NEEDED FOR WHEEZING AND/OR SHORTNESS OF BREATH   [DISCONTINUED] losartan (COZAAR) 50 MG tablet TAKE ONE TABLET BY MOUTH EVERY MORNING   [DISCONTINUED] venlafaxine XR (EFFEXOR-XR) 150 MG 24 hr capsule TAKE ONE CAPSULE BY MOUTH  EVERY MORNING   No facility-administered encounter medications on file as of 06/17/2022.    Allergies (verified) Codeine, Lisinopril, Sulfonamide derivatives, and Tetracyclines & related   History: Past Medical History:  Diagnosis Date   Allergy    Anxiety    Arthritis    RA   Asthma    Chronic back pain    COPD (chronic obstructive pulmonary disease) (HCC)    Depression    Esophageal stricture    GERD (gastroesophageal reflux disease)    Heart murmur    Hematemesis 09/10/2018   Hyperlipidemia    Hypertension    IBS (irritable bowel syndrome)    Interstitial cystitis    Neuromuscular disorder (HCC)    fibromyalgia   PONV (postoperative nausea and vomiting)    Past Surgical History:  Procedure Laterality Date   ABDOMINAL HYSTERECTOMY     APPENDECTOMY     BIOPSY  09/11/2018   Procedure: BIOPSY;  Surgeon: Lemar Lofty., MD;  Location: [facility] ENDOSCOPY;  Service: Gastroenterology;;   CERVICAL FUSION     CERVICAL LAMINECTOMY     CORONARY STENT INTERVENTION N/A 10/07/2019   Procedure: CORONARY STENT INTERVENTION;  Surgeon: Kathleene Hazel, MD;  Location: [facility] INVASIVE CV LAB;  Service: Cardiovascular;  Laterality: N/A;   ESOPHAGOGASTRODUODENOSCOPY     ESOPHAGOGASTRODUODENOSCOPY (EGD) WITH PROPOFOL N/A 09/11/2018   Procedure: ESOPHAGOGASTRODUODENOSCOPY (EGD) WITH PROPOFOL;  Surgeon: Meridee Score Netty Starring., MD;  Location: [facility] ENDOSCOPY;  Service: Gastroenterology;  Laterality: N/A;   FINGER SURGERY     KYPHOPLASTY N/A 03/29/2021   Procedure: Lumbar Three KYPHOPLASTY;  Surgeon: Barnett Abu, MD;  Location: [facility] OR;  Service: Neurosurgery;  Laterality: N/A;   LEFT HEART CATH AND CORONARY ANGIOGRAPHY N/A 10/07/2019   Procedure: LEFT HEART CATH AND CORONARY ANGIOGRAPHY;  Surgeon: Kathleene Hazel, MD;  Location: [facility] INVASIVE CV LAB;  Service: Cardiovascular;  Laterality: N/A;   LUMBAR FUSION     SAVORY DILATION N/A 09/11/2018   Procedure: SAVORY DILATION;  Surgeon:  Meridee Score Netty Starring., MD;  Location: [facility] ENDOSCOPY;  Service: Gastroenterology;  Laterality: N/A;   TONSILLECTOMY     Family History  Problem Relation Age of Onset   Dementia Mother    Heart attack Father    Coronary artery disease Father    Cancer Brother        esophageal   Esophageal cancer Brother    Coronary artery disease Paternal Aunt    Stomach cancer Paternal Aunt    Coronary artery disease Paternal Grandmother    Aneurysm Brother        aortic   Rectal cancer Neg Hx    Colon cancer Neg Hx    Thyroid disease Neg Hx    Social History   Socioeconomic History   Marital status: Single    Spouse name: Not on file   Number of children: Not on file   Years of education: Not on file  Highest education level: Not on file  Occupational History   Occupation: disability  Tobacco Use   Smoking status: Every Day    Packs/day: .5    Types: Cigarettes   Smokeless tobacco: Never   Tobacco comments:    trying to quit   Vaping Use   Vaping Use: Never used  Substance and Sexual Activity   Alcohol use: Never    Comment: occasional   Drug use: Yes    Types: Marijuana    Comment: occas   Sexual activity: Not on file  Other Topics Concern   Not on file  Social History Narrative   Not on file   Social Determinants of Health   Financial Resource Strain: Low Risk  (06/17/2022)   Overall Financial Resource Strain (CARDIA)    Difficulty of Paying Living Expenses: Not hard at all  Food Insecurity: No Food Insecurity (06/17/2022)   Hunger Vital Sign    Worried About Running Out of Food in the Last Year: Never true    Ran Out of Food in the Last Year: Never true  Transportation Needs: No Transportation Needs (06/17/2022)   PRAPARE - Administrator, Civil Service (Medical): No    Lack of Transportation (Non-Medical): No  Physical Activity: Sufficiently Active (06/17/2022)   Exercise Vital Sign    Days of Exercise per Week: 7 days    Minutes of Exercise per Session: 30  min  Stress: No Stress Concern Present (06/17/2022)   [employer] of Occupational Health - Occupational Stress Questionnaire    Feeling of Stress : Not at all  Social Connections: Socially Integrated (06/17/2022)   Social Connection and Isolation Panel [NHANES]    Frequency of Communication with Friends and Family: More than three times a week    Frequency of Social Gatherings with Friends and Family: More than three times a week    Attends Religious Services: More than 4 times per year    Active Member of [employer] or Organizations: Yes    Attends Engineer, structural: More than 4 times per year    Marital Status: Living with partner    Tobacco Counseling Ready to quit: No Counseling given: Yes Tobacco comments: trying to quit    Clinical Intake:  Pre-visit preparation completed: No  Pain : No/denies pain     BMI - recorded: 24.97 Nutritional Status: BMI of 19-24  Normal Nutritional Risks: None Diabetes: No  How often do you need to have someone help you when you read instructions, pamphlets, or other written materials from your doctor or pharmacy?: 1 - Never  Diabetic?  No  Interpreter Needed?: No  Information entered by :: Theresa Mulligan LPN   Activities of Daily Living    06/17/2022    1:57 PM  In your present state of health, do you have any difficulty performing the following activities:  Hearing? 0  Vision? 0  Difficulty concentrating or making decisions? 0  Walking or climbing stairs? 0  Dressing or bathing? 0  Doing errands, shopping? 0  Preparing Food and eating ? N  Using the Toilet? N  In the past six months, have you accidently leaked urine? N  Do you have problems with loss of bowel control? N  Managing your Medications? N  Managing your Finances? N  Housekeeping or managing your Housekeeping? N    Patient Care Team: Shirline Frees, NP as PCP - General (Family Medicine) Kathleene Hazel, MD as PCP - Cardiology  (Cardiology) Gaylord Shih  G, RPH (Inactive) as Pharmacist Scientist, research (physical sciences))  Indicate any recent Medical Services you may have received from other than [facility] providers in the past year (date may be approximate).     Assessment:   This is a routine wellness examination for [place].  Hearing/Vision screen Hearing Screening - Comments:: Denies hearing difficulties   Vision Screening - Comments:: - Not up to date with routine eye exams with  Patient deferred  Dietary issues and exercise activities discussed: Exercise limited by: None identified   Goals Addressed               This Visit's Progress     Increase physical activity (pt-stated)        I want to exercise more and quit smoking.       Depression Screen    06/17/2022    1:56 PM 05/16/2022   11:41 AM 02/13/2022   12:59 PM 12/03/2021   10:38 AM 12/03/2021   10:37 AM 11/22/2021   10:29 AM 06/13/2021    1:32 PM  PHQ 2/9 Scores  PHQ - 2 Score 0 0 0 1 1 1 1   PHQ- 9 Score 0 0 1        Fall Risk    06/17/2022    1:58 PM 02/13/2022   12:59 PM 06/13/2021    1:37 PM 08/23/2015    9:14 AM  Fall Risk   Falls in the past year? 1 1 1  No  Number falls in past yr: 0 1 0   Injury with Fall? 0 0 1   Comment   Fx to back. Followed by Dr Barnett Abu   Risk for fall due to : No Fall Risks History of fall(s) Other (Comment)   Risk for fall due to: Comment   Lost balance   Follow up Falls prevention discussed Falls evaluation completed      FALL RISK PREVENTION PERTAINING TO THE HOME:  Any stairs in or around the home? No  If so, are there any without handrails? No  Home free of loose throw rugs in walkways, pet beds, electrical cords, etc? Yes  Adequate lighting in your home to reduce risk of falls? Yes   ASSISTIVE DEVICES UTILIZED TO PREVENT FALLS:  Life alert? No  Use of a cane, walker or w/c? No  Grab bars in the bathroom? No  Shower chair or bench in shower? No  Elevated toilet seat or a handicapped toilet? No   TIMED UP  AND GO:  Was the test performed? No . Audio Visit  Cognitive Function:        06/17/2022    2:00 PM 06/13/2021    1:41 PM  6CIT Screen  What Year? 0 points 0 points  What month? 0 points 0 points  What time? 0 points 0 points  Count back from 20 0 points 0 points  Months in reverse 0 points 0 points  Repeat phrase 0 points 0 points  Total Score 0 points 0 points    Immunizations Immunization History  Administered Date(s) Administered   Fluad Quad(high Dose 65+) 11/08/2021   Influenza Split 12/16/2011   Influenza Whole 11/15/2008, 10/25/2009   Influenza,inj,Quad PF,6+ Mos 11/18/2012, 11/29/2013, 11/08/2014, 03/14/2017, 12/20/2020   Janssen (J&J) SARS-COV-2 Vaccination 06/12/2019   PNEUMOCOCCAL CONJUGATE-20 02/13/2022   Td 10/25/2009   Tdap 12/01/2021    TDAP status: Up to date  Flu Vaccine status: Up to date  Pneumococcal vaccine status: Up to date  Covid-19 vaccine status: Information provided on  how to obtain vaccines.   Qualifies for Shingles Vaccine? Yes   Zostavax completed No   Shingrix Completed?: No.    Education has been provided regarding the importance of this vaccine. Patient has been advised to call insurance company to determine out of pocket expense if they have not yet received this vaccine. Advised may also receive vaccine at local pharmacy or Health Dept. Verbalized acceptance and understanding.  Screening Tests Health Maintenance  Topic Date Due   DEXA SCAN  Never done   COVID-19 Vaccine (2 - 2023-24 season) 07/03/2022 (Originally 10/12/2021)   Zoster Vaccines- Shingrix (1 of 2) 09/17/2022 (Originally 11/19/2004)   COLONOSCOPY (Pts 45-80yrs Insurance coverage will need to be confirmed)  06/17/2023 (Originally 09/11/2018)   INFLUENZA VACCINE  09/12/2022   MAMMOGRAM  10/18/2022   Medicare Annual Wellness (AWV)  06/17/2023   DTaP/Tdap/Td (3 - Td or Tdap) 12/02/2031   Pneumonia Vaccine 55+ Years old  Completed   Hepatitis C Screening  Completed   HPV  VACCINES  Aged Out    Health Maintenance  Health Maintenance Due  Topic Date Due   DEXA SCAN  Never done    Colorectal cancer screening: Referral to GI placed 06/17/22. Pt aware the office will call re: appt.  Mammogram status: Completed 10/17/21. Repeat every year  Bone Density status: Ordered 11/28/. Pt provided with contact info and advised to call to schedule appt.  Lung Cancer Screening: (Low Dose CT Chest recommended if Age 84-80 years, 30 pack-year currently smoking OR have quit w/in 15years.) does qualify.   Lung Cancer Screening Referral: 06/17/22  Additional Screening:  Hepatitis C Screening: does qualify; Completed 09/14/18  Vision Screening: Recommended annual ophthalmology exams for early detection of glaucoma and other disorders of the eye. Is the patient up to date with their annual eye exam?  No  Who is the provider or what is the name of the office in which the patient attends annual eye exams? Patient deferred If pt is not established with a provider, would they like to be referred to a provider to establish care? No .   Dental Screening: Recommended annual dental exams for proper oral hygiene  Community Resource Referral / Chronic Care Management:  CRR required this visit?  No   CCM required this visit?  No      Plan:     I have personally reviewed and noted the following in the patient's chart:   Medical and social history Use of alcohol, tobacco or illicit drugs  Current medications and supplements including opioid prescriptions. Patient is not currently taking opioid prescriptions. Functional ability and status Nutritional status Physical activity Advanced directives List of other physicians Hospitalizations, surgeries, and ER visits in previous 12 months Vitals Screenings to include cognitive, depression, and falls Referrals and appointments  In addition, I have reviewed and discussed with patient certain preventive protocols, quality metrics,  and best practice recommendations. A written personalized care plan for preventive services as well as general preventive health recommendations were provided to patient.     [name], LPN   02/16/1094   Nurse Notes:   None

## 2022-06-19 ENCOUNTER — Telehealth: Payer: Self-pay | Admitting: *Deleted

## 2022-06-19 NOTE — Telephone Encounter (Addendum)
Cardiac Catheterization scheduled at [facility] for: Thursday Jun 20, 2022 9 AM Arrival time [facility] Main Entrance A at: 7 AM  Nothing to eat after midnight prior to procedure, clear liquids until 5 AM day of procedure.  Medication instructions: -Hold:  Losartan and Lasix (pt reports she rarely takes this)-day before and day of procedure-per protocol GFR 47  Patient identified Losartan in her medication pouch. -Other usual morning medications can be taken with sips of water including aspirin 81 mg and Plavix 75 mg.  Patient identified Plavix in her medication pouch and knows important to take this and aspirin in the AM.  Confirmed patient has responsible adult to drive home post procedure and be with patient first 24 hours after arriving home.  Plan to go home the same day, you will only stay overnight if medically necessary.  Reviewed procedure instructions with patient.

## 2022-06-20 ENCOUNTER — Ambulatory Visit (HOSPITAL_COMMUNITY)
Admission: RE | Admit: 2022-06-20 | Discharge: 2022-06-20 | Disposition: A | Discharge status: Home / Self Care | Payer: Medicare Other | Attending: Cardiovascular Disease | Admitting: Cardiovascular Disease

## 2022-06-20 ENCOUNTER — Encounter (HOSPITAL_COMMUNITY)
Admission: RE | Disposition: A | Discharge status: Home / Self Care | Payer: Self-pay | Source: Home / Self Care | Attending: Cardiovascular Disease

## 2022-06-20 DIAGNOSIS — J4489 Other specified chronic obstructive pulmonary disease: Secondary | ICD-10-CM | POA: Insufficient documentation

## 2022-06-20 DIAGNOSIS — E785 Hyperlipidemia, unspecified: Secondary | ICD-10-CM | POA: Insufficient documentation

## 2022-06-20 DIAGNOSIS — F17210 Nicotine dependence, cigarettes, uncomplicated: Secondary | ICD-10-CM | POA: Diagnosis not present

## 2022-06-20 DIAGNOSIS — Z79899 Other long term (current) drug therapy: Secondary | ICD-10-CM | POA: Insufficient documentation

## 2022-06-20 DIAGNOSIS — Z7902 Long term (current) use of antithrombotics/antiplatelets: Secondary | ICD-10-CM | POA: Diagnosis not present

## 2022-06-20 DIAGNOSIS — I10 Essential (primary) hypertension: Secondary | ICD-10-CM | POA: Insufficient documentation

## 2022-06-20 DIAGNOSIS — M069 Rheumatoid arthritis, unspecified: Secondary | ICD-10-CM | POA: Insufficient documentation

## 2022-06-20 DIAGNOSIS — Z955 Presence of coronary angioplasty implant and graft: Secondary | ICD-10-CM | POA: Diagnosis not present

## 2022-06-20 DIAGNOSIS — K589 Irritable bowel syndrome without diarrhea: Secondary | ICD-10-CM | POA: Insufficient documentation

## 2022-06-20 DIAGNOSIS — K219 Gastro-esophageal reflux disease without esophagitis: Secondary | ICD-10-CM | POA: Diagnosis not present

## 2022-06-20 DIAGNOSIS — I25110 Atherosclerotic heart disease of native coronary artery with unstable angina pectoris: Secondary | ICD-10-CM | POA: Insufficient documentation

## 2022-06-20 DIAGNOSIS — M797 Fibromyalgia: Secondary | ICD-10-CM | POA: Diagnosis not present

## 2022-06-20 DIAGNOSIS — I2510 Atherosclerotic heart disease of native coronary artery without angina pectoris: Secondary | ICD-10-CM | POA: Diagnosis not present

## 2022-06-20 DIAGNOSIS — F419 Anxiety disorder, unspecified: Secondary | ICD-10-CM | POA: Insufficient documentation

## 2022-06-20 HISTORY — PX: LEFT HEART CATH AND CORONARY ANGIOGRAPHY: CATH118249

## 2022-06-20 SURGERY — LEFT HEART CATH AND CORONARY ANGIOGRAPHY
Anesthesia: LOCAL

## 2022-06-20 MED ORDER — SODIUM CHLORIDE 0.9% FLUSH: 3.0000 mL | Freq: Two times a day (BID) | INTRAVENOUS | Status: DC

## 2022-06-20 MED ORDER — HEPARIN SODIUM (PORCINE) 1000 UNIT/ML IJ SOLN
INTRAMUSCULAR | Status: AC
  Filled 2022-06-20: qty 10

## 2022-06-20 MED ORDER — IOHEXOL 350 MG/ML SOLN
INTRAVENOUS | Status: DC | PRN
  Administered 2022-06-20: 45 mL

## 2022-06-20 MED ORDER — ASPIRIN 81 MG PO CHEW
81.0000 mg | CHEWABLE_TABLET | ORAL | Status: AC
  Administered 2022-06-20: 81 mg via ORAL
  Filled 2022-06-20: qty 1

## 2022-06-20 MED ORDER — MIDAZOLAM HCL 2 MG/2ML IJ SOLN
INTRAMUSCULAR | Status: DC | PRN
  Administered 2022-06-20 (×2): 1 mg via INTRAVENOUS

## 2022-06-20 MED ORDER — VERAPAMIL HCL 2.5 MG/ML IV SOLN
INTRAVENOUS | Status: DC | PRN
  Administered 2022-06-20: 10 mL via INTRA_ARTERIAL

## 2022-06-20 MED ORDER — HEPARIN (PORCINE) IN NACL 1000-0.9 UT/500ML-% IV SOLN
INTRAVENOUS | Status: DC | PRN
  Administered 2022-06-20 (×2): 500 mL

## 2022-06-20 MED ORDER — SODIUM CHLORIDE 0.9 % IV SOLN: 250.0000 mL | INTRAVENOUS | Status: DC | PRN

## 2022-06-20 MED ORDER — ONDANSETRON HCL 4 MG/2ML IJ SOLN: 4.0000 mg | Freq: Four times a day (QID) | INTRAMUSCULAR | Status: DC | PRN

## 2022-06-20 MED ORDER — LIDOCAINE HCL (PF) 1 % IJ SOLN
INTRAMUSCULAR | Status: AC
  Filled 2022-06-20: qty 30

## 2022-06-20 MED ORDER — FENTANYL CITRATE (PF) 100 MCG/2ML IJ SOLN
INTRAMUSCULAR | Status: DC | PRN
  Administered 2022-06-20 (×2): 25 ug via INTRAVENOUS

## 2022-06-20 MED ORDER — VERAPAMIL HCL 2.5 MG/ML IV SOLN
INTRAVENOUS | Status: AC
  Filled 2022-06-20: qty 2

## 2022-06-20 MED ORDER — SODIUM CHLORIDE 0.9 % IV SOLN: INTRAVENOUS | Status: AC

## 2022-06-20 MED ORDER — LABETALOL HCL 5 MG/ML IV SOLN
10.0000 mg | INTRAVENOUS | Status: DC | PRN
  Filled 2022-06-20: qty 4

## 2022-06-20 MED ORDER — SODIUM CHLORIDE 0.9% FLUSH: 3.0000 mL | INTRAVENOUS | Status: DC | PRN

## 2022-06-20 MED ORDER — SODIUM CHLORIDE 0.9 % WEIGHT BASED INFUSION: 1.0000 mL/kg/h | INTRAVENOUS | Status: DC

## 2022-06-20 MED ORDER — ACETAMINOPHEN 325 MG PO TABS: 650.0000 mg | ORAL_TABLET | ORAL | Status: DC | PRN

## 2022-06-20 MED ORDER — HYDRALAZINE HCL 20 MG/ML IJ SOLN: 10.0000 mg | INTRAMUSCULAR | Status: DC | PRN

## 2022-06-20 MED ORDER — LIDOCAINE HCL (PF) 1 % IJ SOLN
INTRAMUSCULAR | Status: DC | PRN
  Administered 2022-06-20: 2 mL via INTRADERMAL

## 2022-06-20 MED ORDER — FENTANYL CITRATE (PF) 100 MCG/2ML IJ SOLN
INTRAMUSCULAR | Status: AC
  Filled 2022-06-20: qty 2

## 2022-06-20 MED ORDER — HEPARIN SODIUM (PORCINE) 1000 UNIT/ML IJ SOLN
INTRAMUSCULAR | Status: DC | PRN
  Administered 2022-06-20: 4000 [IU] via INTRAVENOUS

## 2022-06-20 MED ORDER — CLOPIDOGREL BISULFATE 75 MG PO TABS
75.0000 mg | ORAL_TABLET | Freq: Once | ORAL | Status: AC
  Administered 2022-06-20: 75 mg via ORAL
  Filled 2022-06-20: qty 1

## 2022-06-20 MED ORDER — SODIUM CHLORIDE 0.9 % WEIGHT BASED INFUSION
3.0000 mL/kg/h | INTRAVENOUS | Status: AC
  Administered 2022-06-20: 3 mL/kg/h via INTRAVENOUS

## 2022-06-20 MED ORDER — MIDAZOLAM HCL 2 MG/2ML IJ SOLN
INTRAMUSCULAR | Status: AC
  Filled 2022-06-20: qty 2

## 2022-06-20 SURGICAL SUPPLY — 12 items
CATH INFINITI 5 FR JL3.5 (CATHETERS) IMPLANT
CATH INFINITI JR4 5F (CATHETERS) IMPLANT
DEVICE RAD COMP TR BAND LRG (VASCULAR PRODUCTS) IMPLANT
GLIDESHEATH SLEND SS 6F .021 (SHEATH) IMPLANT
GUIDEWIRE INQWIRE 1.5J.035X260 (WIRE) IMPLANT
INQWIRE 1.5J .035X260CM (WIRE) ×1
KIT HEART LEFT (KITS) ×1 IMPLANT
PACK CARDIAC CATHETERIZATION (CUSTOM PROCEDURE TRAY) ×1 IMPLANT
SHEATH PROBE COVER 6X72 (BAG) IMPLANT
TRANSDUCER W/STOPCOCK (MISCELLANEOUS) ×1 IMPLANT
TUBING CIL FLEX 10 FLL-RA (TUBING) ×1 IMPLANT
WIRE HI TORQ VERSACORE-J 145CM (WIRE) IMPLANT

## 2022-06-20 NOTE — Interval H&P Note (Signed)
History and Physical Interval Note:  06/20/2022 7:42 AM  Laurie [name]  has presented today for surgery, with the diagnosis of unstable angina.  The various methods of treatment have been discussed with the patient and family. After consideration of risks, benefits and other options for treatment, the patient has consented to  Procedure(s): LEFT HEART CATH AND CORONARY ANGIOGRAPHY (N/A) as a surgical intervention.  The patient's history has been reviewed, patient examined, no change in status, stable for surgery.  I have reviewed the patient's chart and labs.  Questions were answered to the patient's satisfaction.    Cath Lab Visit (complete for each Cath Lab visit)  Clinical Evaluation Leading to the Procedure:   ACS: No.  Non-ACS:    Anginal Classification: CCS III  Anti-ischemic medical therapy: Minimal Therapy (1 class of medications)  Non-Invasive Test Results: No non-invasive testing performed  Prior CABG: No previous CABG        Laurie [name]

## 2022-06-20 NOTE — Discharge Instructions (Addendum)
Please take your Lasix pill (20mg ) daily for the next 7 days.   Radial Site Care Drink plenty of fluid for the next 3 days  Keep arm elevated for the next 24 hours The following information offers guidance on how to care for yourself after your procedure. Your health care provider may also give you more specific instructions. If you have problems or questions, contact your health care provider. What can I expect after the procedure? After the procedure, it is common to have bruising and tenderness in the incision area. Follow these instructions at home: Incision site care  Follow instructions from your health care provider about how to take care of your incision site. Make sure you:  Remove your dressing 24 hours after discharge.  Do not take baths, swim, or use a hot tub for 1 week. You may shower 24 hours after the procedure or as told by your health care provider. Remove the dressing and gently wash the incision area with plain soap and water. Pat the area dry with a clean towel. Do not rub the site. That could cause bleeding. Do not apply powder or lotion to the site. Check your incision site every day for signs of infection. Check for: Redness, swelling, or pain. Fluid or blood. Warmth. Pus or a bad smell. Activity For 24 hours after the procedure, or as directed by your health care provider: Do not flex or bend the affected arm. Do not push or pull heavy objects with the affected arm. Do not operate machinery or power tools. Do not drive. You should not drive yourself home from the hospital or clinic if you go home during that time period. You may drive 24 hours after the procedure unless your health care provider tells you not to. Do not lift anything that is heavier than 10 lb (4.5 kg), or the limit that you are told, until your health care provider says that it is safe. Return to your normal activities as told by your health care provider. Ask your health care provider what  activities are safe for you and when you can return to work. If you were given a sedative during the procedure, it can affect you for several hours. Do not drive or operate machinery until your health care provider says that it is safe. General instructions Take over-the-counter and prescription medicines only as told by your health care provider. If you will be going home right after the procedure, plan to have a responsible adult care for you for the time you are told. This is important. Keep all follow-up visits. This is important. Contact a health care provider if: You have a fever or chills. You have any of these signs of infection at your incision site: Redness, swelling, or pain. Fluid or blood. Warmth. Pus or a bad smell. Get help right away if: The incision area swells very fast. The incision area is bleeding, and the bleeding does not stop when you hold steady pressure on the area. Your arm or hand becomes pale, cool, tingly, or numb. These symptoms may represent a serious problem that is an emergency. Do not wait to see if the symptoms will go away. Get medical help right away. Call your local emergency services (911 in the [place] Do not drive yourself to the hospital. Summary After the procedure, it is common to have bruising and tenderness at the incision site. Follow instructions from your health care provider about how to take care of your radial site incision. Check  the incision every day for signs of infection. Do not lift anything that is heavier than 10 lb (4.5 kg), or the limit that you are told, until your health care provider says that it is safe. Get help right away if the incision area swells very fast, you have bleeding at the incision site that will not stop, or your arm or hand becomes pale, cool, or numb. This information is not intended to replace advice given to you by your health care provider. Make sure you discuss any questions you have with your health care  provider. Document Revised: 03/19/2020 Document Reviewed: 03/19/2020 Elsevier Patient Education  2023 [employer].

## 2022-06-21 ENCOUNTER — Encounter (HOSPITAL_COMMUNITY): Payer: Self-pay | Admitting: Cardiovascular Disease

## 2022-06-28 NOTE — Telephone Encounter (Signed)
Patient called and has scheduled an appointment on 07/18/2022. Is requesting refill be sent to [facility]

## 2022-07-09 ENCOUNTER — Telehealth: Payer: Self-pay

## 2022-07-09 NOTE — Progress Notes (Unsigned)
Cardiology Office Note:    Date:  07/10/2022   ID:  Laurie [name], DOB [birth year], MRN [id number]  PCP:  Shirline Frees, NP   [facility] Providers Cardiologist:  Verne Carrow, MD     Referring MD: Shirline Frees, NP   Chief Complaint: follow-up CAD   History of Present Illness:    [name] is a pleasant 67 y.o. female with a hx of CAD s/p PCI, hypertension, rheumatoid arthritis, COPD/asthma, GERD, tobacco abuse, and hyperthyroidism.   She established care with Dr. Clifton James August 2021 with concerns for chest pain and dyspnea. Echo in 2019 with LVEF 65%, mild MR. Cardiac cath 2011 with mild CAD. Chronically abnormal EKG. Cardiac cath 10/07/19 with severe mid circumflex/OM stenosis treated with DES x 2, mild disease in prox LAD and moderate disease in mRCA. LV systolic function normal. Had dyspnea on Brilinta, now on aspirin and Plavix.   Her last office visit was 02/28/20 via telemedicine with Dr. Clifton James at which time she was taking Tapazole for recent diagnosis of Graves disease. No changes were made to treatment plan and she was advised to follow-up in 6 months.   I saw her on 09/25/21 for follow-up. She reported more fatigue, increased SOB, and bilateral lower extremity edema. Recently sleeping on increased # pillows and some PND. Occasional sharp chest pains, not frequent occurring both with rest and activity. Does not exercise consistently but does her own shopping. Has pain in the tops of both of her legs which limits her walking. Recent falls with injury to back and wrist. Prior to fall, she states her legs hurt but she also occasionally gets dizzy. Does not monitor BP at home. Constantly feels cold. 2D echo 10/08/21 revealed normal pumping function at 60-65%, mild LVH, normal diastolic parameters, trivial TR. Lower extremity vascular studies revealed normal ABI bilaterally but slightly abnormal waveforms distally, mostly biphasic. She had normal velocities  throughout the lower extremities. She was referred to Dr. Kirke Corin for [facility] consult and seen 10/16/21. Dr. Kirke Corin felt that symptoms more consistent with lumbar spine etiology and no further testing needed.   Seen for follow-up by me on 12/11/21. Continues to feel fatigued. Needs dental work for tooth decay. One episode of chest pain that improved after belching. Continues to have discomfort in bilateral thighs. Reassurance from PV work-up and office visit with Dr. Kirke Corin. Admits that she needs to exercise, is planning to go the gym with her daughter. Started smoking at age 85, still smoking 1/2 ppd. Quit smoking for 2 weeks then resumed after her brother died a few years ago. No shortness of breath, lower extremity edema, palpitations, melena, hematuria, hemoptysis, diaphoresis, weakness, presyncope, syncope, orthopnea, and PND Continues to report falls that occur with ringing in ears, she becomes dizzy but room is not spinning.  Advised to closely monitor BP for hypotension that may be contributing to falls.  Given nicotine patches for smoking cessation and advised to return in 6 months for follow-up.  Seen in clinic by Dr. Clifton James on 06/10/2022 and reported dizziness, dyspnea, and chest pain with exertion similar to angina prior to last stents.  Advised to undergo cardiac catheterization for unstable angina.  Cardiac catheterization 06/20/2022 revealed mild nonobstructive disease in the proximal to mid LAD, no change since last cath. Patent mid circumflex stents extending into the obtuse marginal branch with no evidence of restenosis, moderate nonobstructive disease in the mid RCA unchanged from last cath. Mild elevation in LV filling pressure, advised to start Lasix 20  mg daily to see if this helps with dyspnea.  Today, she is here for follow-up. Reports she is overall doing well. Has not had any significant episodes of chest pain since cardiac cath. Fell straight back in her kitchen a few months ago. She thinks  she was having chest pain at the time. Since then, she has had right toe numbness. Has extensive gum disease, would like to get dentures. She is exercising by walking 30 minutes around the outside of her house daily.  She denies shortness of breath, palpitations, orthopnea, PND, presyncope, syncope. Says she does not really know what her medications are for, she just takes them. They come in pill packs from [facility]. Lasix is now listed 20 mg daily PRN for leg edema.  Unfortunately she continues to smoke but has reduced to 10 cigs per day.   Past Medical History:  Diagnosis Date   Allergy    Anxiety    Arthritis    RA   Asthma    Chronic back pain    COPD (chronic obstructive pulmonary disease) (HCC)    Depression    Esophageal stricture    GERD (gastroesophageal reflux disease)    Heart murmur    Hematemesis 09/10/2018   Hyperlipidemia    Hypertension    IBS (irritable bowel syndrome)    Interstitial cystitis    Neuromuscular disorder (HCC)    fibromyalgia   PONV (postoperative nausea and vomiting)     Past Surgical History:  Procedure Laterality Date   ABDOMINAL HYSTERECTOMY     APPENDECTOMY     BIOPSY  09/11/2018   Procedure: BIOPSY;  Surgeon: Lemar Lofty., MD;  Location: [facility] ENDOSCOPY;  Service: Gastroenterology;;   CERVICAL FUSION     CERVICAL LAMINECTOMY     CORONARY STENT INTERVENTION N/A 10/07/2019   Procedure: CORONARY STENT INTERVENTION;  Surgeon: Kathleene Hazel, MD;  Location: [facility] INVASIVE CV LAB;  Service: Cardiovascular;  Laterality: N/A;   ESOPHAGOGASTRODUODENOSCOPY     ESOPHAGOGASTRODUODENOSCOPY (EGD) WITH PROPOFOL N/A 09/11/2018   Procedure: ESOPHAGOGASTRODUODENOSCOPY (EGD) WITH PROPOFOL;  Surgeon: Meridee Score Netty Starring., MD;  Location: [facility] ENDOSCOPY;  Service: Gastroenterology;  Laterality: N/A;   FINGER SURGERY     KYPHOPLASTY N/A 03/29/2021   Procedure: Lumbar Three KYPHOPLASTY;  Surgeon: Barnett Abu, MD;  Location: [facility] OR;  Service:  Neurosurgery;  Laterality: N/A;   LEFT HEART CATH AND CORONARY ANGIOGRAPHY N/A 10/07/2019   Procedure: LEFT HEART CATH AND CORONARY ANGIOGRAPHY;  Surgeon: Kathleene Hazel, MD;  Location: [facility] INVASIVE CV LAB;  Service: Cardiovascular;  Laterality: N/A;   LEFT HEART CATH AND CORONARY ANGIOGRAPHY N/A 06/20/2022   Procedure: LEFT HEART CATH AND CORONARY ANGIOGRAPHY;  Surgeon: Kathleene Hazel, MD;  Location: [facility] INVASIVE CV LAB;  Service: Cardiovascular;  Laterality: N/A;   LUMBAR FUSION     SAVORY DILATION N/A 09/11/2018   Procedure: SAVORY DILATION;  Surgeon: Meridee Score Netty Starring., MD;  Location: [facility] ENDOSCOPY;  Service: Gastroenterology;  Laterality: N/A;   TONSILLECTOMY      Current Medications: Current Meds  Medication Sig   albuterol (VENTOLIN HFA) 108 (90 Base) MCG/ACT inhaler INHALE TWO PUFFS BY MOUTH INTO LUNGS every SIX hours AS NEEDED FOR WHEEZING AND/OR SHORTNESS OF BREATH (Patient taking differently: Inhale 2 puffs into the lungs every 6 (six) hours as needed for shortness of breath.)   aspirin EC 81 MG tablet Take 1 tablet (81 mg total) by mouth daily. Swallow whole.   atorvastatin (LIPITOR) 40 MG tablet Take 1  tablet (40 mg total) by mouth every evening.   Blood Pressure Monitoring DEVI Use as needed DX I10   clopidogrel (PLAVIX) 75 MG tablet Take 1 tablet (75 mg total) by mouth daily.   fenofibrate (TRICOR) 145 MG tablet Take 1 tablet (145 mg total) by mouth daily.   fluticasone (FLONASE) 50 MCG/ACT nasal spray instill two SPRAYS in each nostril daily   furosemide (LASIX) 20 MG tablet TAKE ONE TABLET BY MOUTH ONCE daily AS NEEDED FOR LEG EDEMA   lidocaine (XYLOCAINE) 2 % solution Apply 5 ml  around gum of affected teeth up to 4 times per day.   losartan (COZAAR) 50 MG tablet TAKE ONE TABLET BY MOUTH EVERY MORNING (Patient taking differently: Take 50 mg by mouth daily.)   methimazole (TAPAZOLE) 10 MG tablet TAKE TWO TABLETS BY MOUTH EVERY EVENING   metoprolol succinate  (TOPROL-XL) 50 MG 24 hr tablet Take 1 tablet (50 mg total) by mouth daily. Take with or immediately following a meal.   ondansetron (ZOFRAN-ODT) 8 MG disintegrating tablet TAKE ONE TABLET BY MOUTH every EIGHT hours AS NEEDED FOR NAUSEA AND VOMITING (Patient taking differently: Take 8 mg by mouth every 8 (eight) hours as needed for vomiting or nausea.)   pantoprazole (PROTONIX) 40 MG tablet TAKE ONE TABLET BY MOUTH EVERY MORNING (Patient taking differently: Take 40 mg by mouth daily.)   venlafaxine XR (EFFEXOR-XR) 150 MG 24 hr capsule TAKE ONE CAPSULE BY MOUTH EVERY MORNING (Patient taking differently: Take 150 mg by mouth daily with breakfast.)   Vitamin D, Ergocalciferol, (DRISDOL) 1.25 MG (50000 UNIT) CAPS capsule Take 1 capsule (50,000 Units total) by mouth every 7 (seven) days.     Allergies:   Codeine, Lisinopril, Sulfonamide derivatives, and Tetracyclines & related   Social History   Socioeconomic History   Marital status: Single    Spouse name: Not on file   Number of children: Not on file   Years of education: Not on file   Highest education level: Not on file  Occupational History   Occupation: disability  Tobacco Use   Smoking status: Every Day    Packs/day: .5    Types: Cigarettes   Smokeless tobacco: Never   Tobacco comments:    trying to quit   Vaping Use   Vaping Use: Never used  Substance and Sexual Activity   Alcohol use: Never    Comment: occasional   Drug use: Yes    Types: Marijuana    Comment: occas   Sexual activity: Not on file  Other Topics Concern   Not on file  Social History Narrative   Not on file   Social Determinants of Health   Financial Resource Strain: Low Risk  (06/17/2022)   Overall Financial Resource Strain (CARDIA)    Difficulty of Paying Living Expenses: Not hard at all  Food Insecurity: No Food Insecurity (06/17/2022)   Hunger Vital Sign    Worried About Running Out of Food in the Last Year: Never true    Ran Out of Food in the Last  Year: Never true  Transportation Needs: No Transportation Needs (06/17/2022)   PRAPARE - Administrator, Civil Service (Medical): No    Lack of Transportation (Non-Medical): No  Physical Activity: Sufficiently Active (06/17/2022)   Exercise Vital Sign    Days of Exercise per Week: 7 days    Minutes of Exercise per Session: 30 min  Stress: No Stress Concern Present (06/17/2022)   [employer] of Occupational Health -  Occupational Stress Questionnaire    Feeling of Stress : Not at all  Social Connections: Socially Integrated (06/17/2022)   Social Connection and Isolation Panel [NHANES]    Frequency of Communication with Friends and Family: More than three times a week    Frequency of Social Gatherings with Friends and Family: More than three times a week    Attends Religious Services: More than 4 times per year    Active Member of [employer] or Organizations: Yes    Attends Engineer, structural: More than 4 times per year    Marital Status: Living with partner     Family History: The patient's family history includes Aneurysm in her brother; Cancer in her brother; Coronary artery disease in her father, paternal aunt, and paternal grandmother; Dementia in her mother; Esophageal cancer in her brother; Heart attack in her father; Stomach cancer in her paternal aunt. There is no history of Rectal cancer, Colon cancer, or Thyroid disease.  ROS:   Please see the history of present illness.   + frequent falls All other systems reviewed and are negative.  Labs/Other Studies Reviewed:    The following studies were reviewed today:  LHC 06/20/22   Prox LAD lesion is 40% stenosed.   Mid RCA lesion is 50% stenosed.   Previously placed Prox Cx to Mid Cx stent of unknown type is  widely patent.   There is hyperdynamic left ventricular systolic function.   LV end diastolic pressure is mildly elevated.   The left ventricular ejection fraction is greater than 65% by visual estimate.    Mild non-obstructive disease in the proximal to mid LAD. No change since last cath Patent mid Circumflex stents extending into the obtuse marginal branch with no evidence of restenosis.  Moderate non-obstructive disease in the mid RCA, unchanged from last cath Mild elevation LV filling pressure   Recommendations: Continue medical management of CAD. I will have her take Lasix 20 mg daily for the next week and see if this helps with her dyspnea.   Vas Carotid US 10/18/21  Right Carotid: Velocities in the right ICA are consistent with a 1-39%  stenosis.                Non-hemodynamically significant plaque <50% noted in the  CCA.   Left Carotid: Velocities in the left ICA are consistent with a 1-39%  stenosis.   Vertebrals: Bilateral vertebral arteries demonstrate antegrade flow.  Subclavians: Right subclavian artery flow was disturbed. Normal flow               hemodynamics were seen in the left subclavian artery.   *See table(s) above for measurements and observations.   Echo 10/08/21  1. Left ventricular ejection fraction, by estimation, is 60 to 65%. The  left ventricle has normal function. The left ventricle has no regional  wall motion abnormalities. There is mild left ventricular hypertrophy.  Left ventricular diastolic parameters  were normal.   2. Right ventricular systolic function is normal. The right ventricular  size is normal. There is normal pulmonary artery systolic pressure. The  estimated right ventricular systolic pressure is 19.5 mmHg.   3. The mitral valve is normal in structure. Trivial mitral valve  regurgitation. No evidence of mitral stenosis.   4. The aortic valve is tricuspid. Aortic valve regurgitation is trivial.  No aortic stenosis is present.   5. The inferior vena cava is normal in size with greater than 50%  respiratory variability, suggesting right atrial pressure  of 3 mmHg.   ABIs 10/03/21  Summary:  Right: The right toe-brachial index is  normal.  Although ankle brachial indices are within normal limits (0.95-1.29),  arterial Doppler waveforms at the ankle suggest some component of arterial  occlusive disease.  Left: The left toe-brachial index is normal.  Although ankle brachial indices are within normal limits (0.95-1.29),  arterial Doppler waveforms at the ankle suggest some component of arterial  occlusive disease.    LHC 09/2019 Mid RCA lesion is 50% stenosed. 2nd Mrg lesion is 80% stenosed. Mid Cx lesion is 90% stenosed. Prox LAD lesion is 40% stenosed. A drug-eluting stent was successfully placed using a STENT RESOLUTE ONYX [id number]. Post intervention, there is a 0% residual stenosis. A drug-eluting stent was successfully placed using a STENT RESOLUTE ONYX [id number]. Post intervention, there is a 0% residual stenosis. The left ventricular systolic function is normal. LV end diastolic pressure is normal. There is no mitral valve regurgitation. The left ventricular ejection fraction is 55-65% by visual estimate.   1. Mild non-obstructive disease in the proximal to mid LAD 2. Severe stenosis mid Circumflex into OM1 3. Successful PTCA/DES x 2 Mid Circumflex into OM1 4. Moderate non-obstructive disease in the mid RCA 5. Normal LV systolic function.    Recommendations: Continue DAPT with ASA and Brilinta for at least 6 months post stenting.  Continue statin. Consider beta blocker therapy at follow up. Same day discharge post PCI if stable.   VAS LE (DVT) 09/2018  Right: There is no evidence of deep vein thrombosis in the lower  extremity.  Left: No evidence of common femoral vein obstruction.     Echo 10/2017  Left ventricle: The cavity size was normal. There was mild    concentric hypertrophy. Systolic function was vigorous. The    estimated ejection fraction was in the range of 65% to 70%. Wall    motion was normal; there were no regional wall motion    abnormalities. Left ventricular diastolic function  parameters    were normal.  - Aortic valve: Valve area (VTI): 2.38 cm^2. Valve area (Vmax):    2.41 cm^2. Valve area (Vmean): 2.45 cm^2.  - Aortic root: The aortic root was normal in size.  - Mitral valve: Structurally normal valve. There was mild    regurgitation.  - Left atrium: The atrium was at the upper limits of normal in    size.  - Right ventricle: The cavity size was normal. Wall thickness was    normal. Systolic function was normal.  - Right atrium: The atrium was normal in size.  - Tricuspid valve: There was mild regurgitation.  - Pulmonary arteries: Systolic pressure was within the normal    range.  - Inferior vena cava: The vessel was normal in size.  - Pericardium, extracardiac: There was no pericardial effusion.    Recent Labs: 02/13/2022: ALT 12; TSH 1.53 06/10/2022: BUN 22; Creatinine, Ser 1.25; Hemoglobin 12.4; Platelets 316; Potassium 4.5; Sodium 142  Recent Lipid Panel    Component Value Date/Time   CHOL 120 02/13/2022 1332   CHOL 137 10/08/2021 0000   TRIG 88.0 02/13/2022 1332   HDL 43.50 02/13/2022 1332   HDL 39 (L) 10/08/2021 0000   CHOLHDL 3 02/13/2022 1332   VLDL 17.6 02/13/2022 1332   LDLCALC 59 02/13/2022 1332   LDLCALC 60 10/08/2021 0000   LDLCALC 81 09/03/2019 1353   LDLDIRECT 123.0 03/14/2017 0948     Risk Assessment/Calculations:      Physical Exam:  VS:  BP (!) 140/70   Pulse 71   Ht 5\' 10"  (1.778 m)   Wt 175 lb 9.6 oz (79.7 kg)   SpO2 94%   BMI 25.20 kg/m     Wt Readings from Last 3 Encounters:  07/10/22 175 lb 9.6 oz (79.7 kg)  06/20/22 174 lb (78.9 kg)  06/17/22 174 lb (78.9 kg)     GEN:  Well nourished, well developed in no acute distress HEENT: Normal NECK: No JVD; Right carotid bruits CARDIAC: RRR, no murmurs, rubs, gallops RESPIRATORY:  Clear to auscultation without rales, wheezing or rhonchi  ABDOMEN: Soft, non-tender, non-distended MUSCULOSKELETAL:  Mild LE edema. No deformity. 2+ pedal pulses, equal  bilaterally SKIN: Warm and dry NEUROLOGIC:  Alert and oriented x 3 PSYCHIATRIC:  Normal affect   EKG:  EKG is not ordered today  Diagnoses:    1. Coronary artery disease involving native coronary artery of native heart without angina pectoris   2. Hyperlipidemia LDL goal <70   3. Essential hypertension   4. Hypertriglyceridemia   5. Tobacco abuse   6. PAD (peripheral artery disease) (HCC)   7. Bilateral carotid artery stenosis   8. Frequent falls   9. Preoperative cardiovascular examination      Assessment and Plan:     Preoperative cardiac evaluation: She is hoping to undergo teeth extraction and implant of permanent dentures. According to the Revised Cardiac Risk Index (RCRI), her Perioperative Risk of Major Cardiac Event is (%): 0.9. Her Functional Capacity in METs is: 7.59 according to the Duke Activity Status Index (DASI). The patient is doing well from a cardiac perspective. Therefore, based on ACC/AHA guidelines, the patient would be at acceptable risk for the planned procedure without further cardiovascular testing. She may hold Plavix for 5 days prior to procedure. Ideally aspirin should be continued without interruption, however if the bleeding risk is too great, aspirin may be held for 5-7 days prior to surgery. Please resume aspirin post operatively when it is felt to be safe from a bleeding standpoint.   CAD without angina: [facility] 06/20/22 for symptoms concerning for unstable angina. Mild to moderate non-obstructive disease, unchanged from last cath with patent stents and no evidence of restenosis. Mildly elevated LV filling pressure. Emphasized the importance of secondary prevention including 150 minutes of moderate intensity exercise each week, low sodium heart healthy diet, tobacco cessation, and cholesterol management.  She is walking 30 minutes daily for exercise with no chest pain, shortness of breath, palpitations, or other symptoms concerning for angina.  Hypertension: BP  has been well controlled. Does not monitor consistently.   Frequent falls: Reports last fall was straight back while standing in her kitchen. Denies LOC. Thinks her only symptom prior was chest pain. Did not seek immediate medical attention. No further episodes. Has numbness in bilateral feet. History of back surgery. Encouraged her to follow-up with neurosurgeon for leg numbness.    Hyperlipidemia LDL goal < 70/Hypertriglyceridemia: LDL 59, Triglycerides 88 on 02/13/22. Encouraged heart healthy, mostly plant-based diet and to continue regular exercise.  We will recheck fasting labs in 6 months. Continue fenofibrate, and atorvastatin.  Tobacco abuse: Has decreased smoking to 10 cigarettes per day. Complete cessation advised.   PAD: Evidence of mild nonobstructive atherosclerosis affecting lower extremities. Seen by Dr. Kirke Corin, [facility] cardiology, on 10/16/2021. Symptoms of low back and leg pain felt to be more consistent with lumbar spine etiology. No further testing recommended. Encouraged 150 minutes of moderate intensity exercise each week which she now  achieves without significant symptoms.   Mild carotid artery stenosis: Mild 1 to 39% stenosis bilaterally on duplex 10/18/21. She is asymptomatic.     Disposition: 6 months with Dr. Clifton James or me with labs prior  Medication Adjustments/Labs and Tests Ordered: Current medicines are reviewed at length with the patient today.  Concerns regarding medicines are outlined above.  Orders Placed This Encounter  Procedures   Comp Met (CMET)   Lipid Profile   No orders of the defined types were placed in this encounter.   Patient Instructions  Medication Instructions:   Your physician recommends that you continue on your current medications as directed. Please refer to the Current Medication list given to you today.   *If you need a refill on your cardiac medications before your next appointment, please call your pharmacy*   Lab Work  None  ordered.  If you have labs (blood work) drawn today and your tests are completely normal, you will receive your results only by: MyChart Message (if you have MyChart) OR A paper copy in the mail If you have any lab test that is abnormal or we need to change your treatment, we will call you to review the results.   Testing/Procedures:  None ordered.    Follow-Up: At [facility], you and your health needs are our priority.  As part of our continuing mission to provide you with exceptional heart care, we have created designated Provider Care Teams.  These Care Teams include your primary Cardiologist (physician) and Advanced Practice Providers (APPs -  Physician Assistants and Nurse Practitioners) who all work together to provide you with the care you need, when you need it.  We recommend signing up for the patient portal called "MyChart".  Sign up information is provided on this After Visit Summary.  MyChart is used to connect with patients for Virtual Visits (Telemedicine).  Patients are able to view lab/test results, encounter notes, upcoming appointments, etc.  Non-urgent messages can be sent to your provider as well.   To learn more about what you can do with MyChart, go to [email or web address].    Your next appointment:   6 month(s)  Provider:   Dr. Clifton James or Eligha Bridegroom       Other Instructions  Will contact you in 2 months with fasting lab appointment and a office follow up for 6 months.     Signed, Levi Aland, NP  07/10/2022 2:32 PM    [facility]

## 2022-07-09 NOTE — Progress Notes (Unsigned)
Care Management & Coordination Services Pharmacy Team  Reason for Encounter: Medication coordination and delivery  Contacted patient to discuss medications and coordinate delivery from [facility]. {US HC Outreach:28874}  Cycle dispensing form sent to *** for review.   Last adherence delivery date: 06/19/2022      Patient is due for next adherence delivery on: 07/19/2022  This delivery to include: Adherence Packaging  30 Days  Aspirin EC 81 mg: one tablet at dinner Furosemide 20 mg 1 tablet daily as needed Venlafaxine XR 150 mg: one capsule with breakfast Metoprolol succinate 50 MG 24 hr: one tablet at breakfast Losartan 50 mg: one tablet with breakfast Fluticasone 50 MCG/ACT nasal spray: Place 2 sprays into both nostrils daily Albuterol HFA : 2 puffs every 6 hours as needed Pantoprazole 40 mg: one tablet at breakfast Clopidogrel 75 mg: one tablet at breakfast  Atorvastatin 40 mg: one tablet at dinner Fenofibrate 145 mg: one tablet at dinner Ondansetron 8 mg: 1 tablet every 8 hours as needed for nausea or vomiting  Patient declined the following medications this month: Methimazole 10 mg: two tablets at dinner   {Delivery date:25786}  Any concerns about your medications? {yes/no:20286}  How often do you forget or accidentally miss a dose? {Missed doses:25554}  Is patient in packaging Yes  If yes  What is the date on your next pill pack?  Any concerns or issues with your packaging?   Recent blood pressure readings are as follows:***  Recent blood glucose readings are as follows:***   Chart review: Recent office visits:  None  Recent consult visits:  06/10/2022 Verne Carrow MD (cardiology) - Patient was seen for Coronary artery disease involving native coronary artery of native heart with unstable angina pectoris and additional concerns.   Hospital visits:  Admitted to [facility] on 06/20/2022 (4 hrs) due to left heart cath and  coronary angiography.   New?Medications Started at [facility] Discharge:?? None Medication Changes at Hospital Discharge: None Medications Discontinued at Hospital Discharge: None Medications that remain the same after Hospital Discharge:??  -All other medications will remain the same.    Medications: Outpatient Encounter Medications as of 07/09/2022  Medication Sig   albuterol (VENTOLIN HFA) 108 (90 Base) MCG/ACT inhaler INHALE TWO PUFFS BY MOUTH INTO LUNGS every SIX hours AS NEEDED FOR WHEEZING AND/OR SHORTNESS OF BREATH (Patient taking differently: Inhale 2 puffs into the lungs every 6 (six) hours as needed for shortness of breath.)   aspirin EC 81 MG tablet Take 1 tablet (81 mg total) by mouth daily. Swallow whole.   atorvastatin (LIPITOR) 40 MG tablet Take 1 tablet (40 mg total) by mouth every evening.   Blood Pressure Monitoring DEVI Use as needed DX I10 (Patient not taking: Reported on 06/20/2022)   clopidogrel (PLAVIX) 75 MG tablet Take 1 tablet (75 mg total) by mouth daily.   fenofibrate (TRICOR) 145 MG tablet Take 1 tablet (145 mg total) by mouth daily.   fluticasone (FLONASE) 50 MCG/ACT nasal spray instill two SPRAYS in each nostril daily (Patient not taking: Reported on 06/20/2022)   furosemide (LASIX) 20 MG tablet TAKE ONE TABLET BY MOUTH ONCE daily AS NEEDED FOR LEG EDEMA (Patient not taking: Reported on 06/20/2022)   lidocaine (XYLOCAINE) 2 % solution Apply 5 ml  around gum of affected teeth up to 4 times per day. (Patient not taking: Reported on 06/20/2022)   losartan (COZAAR) 50 MG tablet TAKE ONE TABLET BY MOUTH EVERY MORNING (Patient taking differently: Take 50 mg by  mouth daily.)   methimazole (TAPAZOLE) 10 MG tablet TAKE TWO TABLETS BY MOUTH EVERY EVENING   metoprolol succinate (TOPROL-XL) 50 MG 24 hr tablet Take 1 tablet (50 mg total) by mouth daily. Take with or immediately following a meal.   nitroGLYCERIN (NITROSTAT) 0.4 MG SL tablet Place 1 tablet (0.4 mg total) under the  tongue every 5 (five) minutes as needed for chest pain. (Patient not taking: Reported on 06/20/2022)   ondansetron (ZOFRAN-ODT) 8 MG disintegrating tablet TAKE ONE TABLET BY MOUTH every EIGHT hours AS NEEDED FOR NAUSEA AND VOMITING (Patient taking differently: Take 8 mg by mouth every 8 (eight) hours as needed for vomiting or nausea.)   pantoprazole (PROTONIX) 40 MG tablet TAKE ONE TABLET BY MOUTH EVERY MORNING (Patient taking differently: Take 40 mg by mouth daily.)   venlafaxine XR (EFFEXOR-XR) 150 MG 24 hr capsule TAKE ONE CAPSULE BY MOUTH EVERY MORNING (Patient taking differently: Take 150 mg by mouth daily with breakfast.)   Vitamin D, Ergocalciferol, (DRISDOL) 1.25 MG (50000 UNIT) CAPS capsule Take 1 capsule (50,000 Units total) by mouth every 7 (seven) days.   No facility-administered encounter medications on file as of 07/09/2022.   BP Readings from Last 3 Encounters:  06/20/22 (!) 140/62  06/10/22 128/74  05/16/22 (!) 140/70    Pulse Readings from Last 3 Encounters:  06/20/22 66  06/10/22 71  05/16/22 75    Lab Results  Component Value Date/Time   HGBA1C 6.0 02/13/2022 01:32 PM   HGBA1C 5.4 09/13/2018 03:11 AM   Lab Results  Component Value Date   CREATININE 1.25 (H) 06/10/2022   BUN 22 06/10/2022   GFR 63.72 02/13/2022   GFRNONAA >60 03/29/2021   GFRAA 76 09/27/2019   NA 142 06/10/2022   K 4.5 06/10/2022   CALCIUM 9.3 06/10/2022   CO2 23 06/10/2022    Inetta Fermo CMA  Clinical Pharmacist Assistant [phone]

## 2022-07-10 ENCOUNTER — Ambulatory Visit: Payer: Medicare Other | Attending: Nurse Practitioner | Admitting: Nurse Practitioner

## 2022-07-10 ENCOUNTER — Encounter: Payer: Self-pay | Admitting: Nurse Practitioner

## 2022-07-10 VITALS — BP 140/70 | HR 71 | Ht 70.0 in | Wt 175.6 lb

## 2022-07-10 DIAGNOSIS — R296 Repeated falls: Secondary | ICD-10-CM

## 2022-07-10 DIAGNOSIS — Z720 Tobacco use: Secondary | ICD-10-CM

## 2022-07-10 DIAGNOSIS — I2510 Atherosclerotic heart disease of native coronary artery without angina pectoris: Secondary | ICD-10-CM | POA: Diagnosis not present

## 2022-07-10 DIAGNOSIS — I739 Peripheral vascular disease, unspecified: Secondary | ICD-10-CM | POA: Diagnosis not present

## 2022-07-10 DIAGNOSIS — Z01810 Encounter for preprocedural cardiovascular examination: Secondary | ICD-10-CM | POA: Diagnosis not present

## 2022-07-10 DIAGNOSIS — E785 Hyperlipidemia, unspecified: Secondary | ICD-10-CM

## 2022-07-10 DIAGNOSIS — E781 Pure hyperglyceridemia: Secondary | ICD-10-CM

## 2022-07-10 DIAGNOSIS — I10 Essential (primary) hypertension: Secondary | ICD-10-CM | POA: Diagnosis not present

## 2022-07-10 DIAGNOSIS — I6523 Occlusion and stenosis of bilateral carotid arteries: Secondary | ICD-10-CM

## 2022-07-10 NOTE — Patient Instructions (Signed)
Medication Instructions:   Your physician recommends that you continue on your current medications as directed. Please refer to the Current Medication list given to you today.   *If you need a refill on your cardiac medications before your next appointment, please call your pharmacy*   Lab Work  None ordered.  If you have labs (blood work) drawn today and your tests are completely normal, you will receive your results only by: MyChart Message (if you have MyChart) OR A paper copy in the mail If you have any lab test that is abnormal or we need to change your treatment, we will call you to review the results.   Testing/Procedures:  None ordered.    Follow-Up: At [facility], you and your health needs are our priority.  As part of our continuing mission to provide you with exceptional heart care, we have created designated Provider Care Teams.  These Care Teams include your primary Cardiologist (physician) and Advanced Practice Providers (APPs -  Physician Assistants and Nurse Practitioners) who all work together to provide you with the care you need, when you need it.  We recommend signing up for the patient portal called "MyChart".  Sign up information is provided on this After Visit Summary.  MyChart is used to connect with patients for Virtual Visits (Telemedicine).  Patients are able to view lab/test results, encounter notes, upcoming appointments, etc.  Non-urgent messages can be sent to your provider as well.   To learn more about what you can do with MyChart, go to [email or web address].    Your next appointment:   6 month(s)  Provider:   Dr. Clifton James or Eligha Bridegroom       Other Instructions  Will contact you in 2 months with fasting lab appointment and a office follow up for 6 months.

## 2022-07-11 NOTE — Progress Notes (Signed)
Care Management & Coordination Services Pharmacy Note  07/11/2022 Name:  Laurie [name] MRN:  [id number] DOB:  1955/11/29  Summary: BP not at goal <140/90, not checking at home LDL at goal <70 C/o of leg numbness after a fall last month  Recommendations/Changes made from today's visit: -Counseled to check BP 2-3x/week at home and keep a log -Counseled on limiting sodium intake -Transferred to front office at end of appt to schedule PCP appt for leg numbness  Follow up plan: -BP call in 1 month and 3 months -Pharmacist visit in 4 months   Subjective: Laurie [name] is an 67 y.o. year old female who is a primary patient of Shirline Frees, NP.  The care coordination team was consulted for assistance with disease management and care coordination needs.    Engaged with patient by telephone for follow up visit.  Recent office visits: 06/17/22 Theresa Mulligan, LPN - For AWV, no med changes  05/16/2022 Shirline Frees NP - Patient was seen for left hand pain and an additional concern. Started Penicillin v potassium. Discontinued Augmentin.   02/13/2022 Shirline Frees NP - Patient was seen for Routine general medical examination at a health care facility and additional concerns. No medication changes   Recent consult visits: 07/10/22 Eligha Bridegroom, NP (Cardio) - For CAD and other concerns. No medication changes  06/10/2022 Verne Carrow MD (cardiology) - Patient was seen for Coronary artery disease involving native coronary artery of native heart with unstable angina pectoris and additional concerns.   Hospital visits: 06/20/22 [facility], For left heart cath, LOS 4 hours, no med changes   Objective:  Lab Results  Component Value Date   CREATININE 1.25 (H) 06/10/2022   BUN 22 06/10/2022   GFR 63.72 02/13/2022   EGFR 47 (L) 06/10/2022   GFRNONAA >60 03/29/2021   GFRAA 76 09/27/2019   NA 142 06/10/2022   K 4.5 06/10/2022   CALCIUM 9.3 06/10/2022   CO2 23  06/10/2022   GLUCOSE 84 06/10/2022    Lab Results  Component Value Date/Time   HGBA1C 6.0 02/13/2022 01:32 PM   HGBA1C 5.4 09/13/2018 03:11 AM   GFR 63.72 02/13/2022 01:32 PM   GFR 73.56 02/09/2021 09:01 AM   MICROALBUR 2.7 (H) 02/09/2021 09:01 AM    Last diabetic Eye exam: No results found for: "HMDIABEYEEXA"  Last diabetic Foot exam: No results found for: "HMDIABFOOTEX"   Lab Results  Component Value Date   CHOL 120 02/13/2022   HDL 43.50 02/13/2022   LDLCALC 59 02/13/2022   LDLDIRECT 123.0 03/14/2017   TRIG 88.0 02/13/2022   CHOLHDL 3 02/13/2022       Latest Ref Rng & Units 02/13/2022    1:32 PM 10/08/2021   12:00 AM 02/09/2021    9:01 AM  Hepatic Function  Total Protein 6.0 - 8.3 g/dL 7.3  6.7  7.2   Albumin 3.5 - 5.2 g/dL 4.2  4.3  4.1   AST 0 - 37 U/L 14  15  13    ALT 0 - 35 U/L 12  13  14    Alk Phosphatase 39 - 117 U/L 52  116  111   Total Bilirubin 0.2 - 1.2 mg/dL 0.3  <4.0  0.3     Lab Results  Component Value Date/Time   TSH 1.53 02/13/2022 01:32 PM   TSH 2.94 04/25/2021 11:51 AM   FREET4 0.69 04/25/2021 11:51 AM   FREET4 0.69 04/26/2020 09:49 AM       Latest Ref  Rng & Units 06/10/2022    3:59 PM 02/13/2022    1:32 PM 03/29/2021    1:09 PM  CBC  WBC 3.4 - 10.8 x10E3/uL 9.2  10.5  8.6   Hemoglobin 11.1 - 15.9 g/dL 09.8  11.9  14.7   Hematocrit 34.0 - 46.6 % 38.1  36.6  38.1   Platelets 150 - 450 x10E3/uL 316  344.0  226     Lab Results  Component Value Date/Time   VD25OH 19.05 (L) 02/13/2022 01:32 PM   VD25OH 38.94 03/14/2017 09:48 AM   VITAMINB12 297 11/23/2018 11:53 AM   VITAMINB12 423 08/18/2015 08:36 AM    Clinical ASCVD: Yes  The ASCVD Risk score (Arnett DK, et al., 2019) failed to calculate for the following reasons:   The valid total cholesterol range is 130 to 320 mg/dL       09/12/9560    1:30 PM 05/16/2022   11:41 AM 02/13/2022   12:59 PM  Depression screen PHQ 2/9  Decreased Interest 0 0 0  Down, Depressed, Hopeless 0 0 0  PHQ - 2  Score 0 0 0  Altered sleeping 0 0 0  Tired, decreased energy 0 0 1  Change in appetite 0 0 0  Feeling bad or failure about yourself  0 0 0  Trouble concentrating 0 0 0  Moving slowly or fidgety/restless 0 0 0  Suicidal thoughts 0 0 0  PHQ-9 Score 0 0 1  Difficult doing work/chores Not difficult at all Not difficult at all Not difficult at all     Social History   Tobacco Use  Smoking Status Every Day   Packs/day: .5   Types: Cigarettes  Smokeless Tobacco Never  Tobacco Comments   trying to quit    BP Readings from Last 3 Encounters:  07/10/22 (!) 140/70  06/20/22 (!) 140/62  06/10/22 128/74   Pulse Readings from Last 3 Encounters:  07/10/22 71  06/20/22 66  06/10/22 71   Wt Readings from Last 3 Encounters:  07/10/22 175 lb 9.6 oz (79.7 kg)  06/20/22 174 lb (78.9 kg)  06/17/22 174 lb (78.9 kg)   BMI Readings from Last 3 Encounters:  07/10/22 25.20 kg/m  06/20/22 24.97 kg/m  06/17/22 24.97 kg/m    Allergies  Allergen Reactions   Codeine Nausea And Vomiting   Lisinopril Cough   Sulfonamide Derivatives Nausea And Vomiting   Tetracyclines & Related Rash    Medications Reviewed Today     Reviewed by Levi Aland, NP (Nurse Practitioner) on 07/10/22 at 1427  Med List Status: <None>   Medication Order Taking? Sig Documenting Provider Last Dose Status Informant  albuterol (VENTOLIN HFA) 108 (90 Base) MCG/ACT inhaler [id number] Yes INHALE TWO PUFFS BY MOUTH INTO LUNGS every SIX hours AS NEEDED FOR WHEEZING AND/OR SHORTNESS OF BREATH  Patient taking differently: Inhale 2 puffs into the lungs every 6 (six) hours as needed for shortness of breath.   Shirline Frees, NP Taking Active Self, Pharmacy Records  aspirin EC 81 MG tablet [id number] Yes Take 1 tablet (81 mg total) by mouth daily. Swallow whole. Kathleene Hazel, MD Taking Active Self, Pharmacy Records  atorvastatin (LIPITOR) 40 MG tablet [id number] Yes Take 1 tablet (40 mg total) by mouth every  evening. Kathleene Hazel, MD Taking Active Self, Pharmacy Records  Blood Pressure Monitoring DEVI [id number] Yes Use as needed DX I10 Kristian Covey, MD Taking Active Self, Pharmacy Records  clopidogrel (PLAVIX) 75 MG tablet [id number] Yes Take  1 tablet (75 mg total) by mouth daily. Kathleene Hazel, MD Taking Active Self, Pharmacy Records  fenofibrate [facility]) 145 MG tablet [id number] Yes Take 1 tablet (145 mg total) by mouth daily. Swinyer, Zachary George, NP Taking Active Self, Pharmacy Records  fluticasone [facility]) 50 MCG/ACT nasal spray [id number] Yes instill two SPRAYS in each nostril daily Nafziger, Kandee Keen, NP Taking Active Self, Pharmacy Records  furosemide (LASIX) 20 MG tablet [id number] Yes TAKE ONE TABLET BY MOUTH ONCE daily AS NEEDED FOR LEG EDEMA Nafziger, Kandee Keen, NP Taking Active Self, Pharmacy Records  lidocaine (XYLOCAINE) 2 % solution [id number] Yes Apply 5 ml  around gum of affected teeth up to 4 times per day. [place], Betty G, MD Taking Active Self, Pharmacy Records  losartan (COZAAR) 50 MG tablet [id number] Yes TAKE ONE TABLET BY MOUTH EVERY MORNING  Patient taking differently: Take 50 mg by mouth daily.   Shirline Frees, NP Taking Active Self, Pharmacy Records  methimazole (TAPAZOLE) 10 MG tablet [id number] Yes TAKE TWO TABLETS BY MOUTH EVERY EVENING Shamleffer, Konrad Dolores, MD Taking Active Self, Pharmacy Records  metoprolol succinate (TOPROL-XL) 50 MG 24 hr tablet [id number] Yes Take 1 tablet (50 mg total) by mouth daily. Take with or immediately following a meal. Nafziger, Kandee Keen, NP Taking Active Self, Pharmacy Records  nitroGLYCERIN (NITROSTAT) 0.4 MG SL tablet [id number]  Place 1 tablet (0.4 mg total) under the tongue every 5 (five) minutes as needed for chest pain.  Patient not taking: Reported on 06/20/2022   Furth, Cadence H, PA-C  Expired 01/17/20 2359   ondansetron (ZOFRAN-ODT) 8 MG disintegrating tablet [id number] Yes TAKE ONE TABLET BY MOUTH every EIGHT hours AS  NEEDED FOR NAUSEA AND VOMITING  Patient taking differently: Take 8 mg by mouth every 8 (eight) hours as needed for vomiting or nausea.   Shirline Frees, NP Taking Active Self, Pharmacy Records  pantoprazole (PROTONIX) 40 MG tablet [id number] Yes TAKE ONE TABLET BY MOUTH EVERY MORNING  Patient taking differently: Take 40 mg by mouth daily.   Nafziger, Kandee Keen, NP Taking Active Self, Pharmacy Records  venlafaxine XR (EFFEXOR-XR) 150 MG 24 hr capsule [id number] Yes TAKE ONE CAPSULE BY MOUTH EVERY MORNING  Patient taking differently: Take 150 mg by mouth daily with breakfast.   Shirline Frees, NP Taking Active Self, Pharmacy Records  Vitamin D, Ergocalciferol, (DRISDOL) 1.25 MG (50000 UNIT) CAPS capsule [id number] Yes Take 1 capsule (50,000 Units total) by mouth every 7 (seven) days. Nafziger, Kandee Keen, NP Taking Active Self, Pharmacy Records            SDOH:  (Social Determinants of Health) assessments and interventions performed: Yes SDOH Interventions    Flowsheet Row Clinical Support from 06/17/2022 in [facility] [place] Telephone from 12/03/2021 in [facility] Care Coordination Telephone from 11/22/2021 in [facility] Coordination Chronic Care Management from 09/18/2021 in [facility] [place] Clinical Support from 06/13/2021 in [facility] [place] Chronic Care Management from 11/24/2019 in [facility] [place]  SDOH Interventions        Food Insecurity Interventions Intervention Not Indicated Intervention Not Indicated Intervention Not Indicated -- Intervention Not Indicated --  Housing Interventions Intervention Not Indicated Intervention Not Indicated Intervention Not Indicated -- Intervention Not Indicated --  Transportation Interventions Intervention Not Indicated Intervention Not Indicated Intervention Not Indicated -- Intervention Not Indicated  Intervention Not Indicated  Utilities Interventions Intervention Not Indicated Intervention Not Indicated Intervention Not Indicated -- -- --  Alcohol Usage Interventions Intervention Not Indicated (Score <7) -- -- -- -- --  Financial Strain Interventions Intervention Not Indicated -- -- Intervention Not Indicated Intervention Not Indicated Intervention Not Indicated  Physical Activity Interventions Intervention Not Indicated -- -- -- Intervention Not Indicated --  Stress Interventions Intervention Not Indicated -- -- -- Intervention Not Indicated --  Social Connections Interventions Intervention Not Indicated -- -- -- Intervention Not Indicated --       Medication Assistance: None required.  Patient affirms current coverage meets needs.  Medication Access: Name and location of current pharmacy:  [facility] - [place], [place] - [address] [place] [address] Phone: [phone] Fax: [phone]  [id number] - [place], [place] - [address] [place] [address] Phone: [phone] Fax: [phone]  Within the past 30 days, how often has patient missed a dose of medication? None Is a pillbox or other method used to improve adherence? Yes  Factors that may affect medication adherence? no barriers identified Are meds synced by current pharmacy? Yes  Are meds delivered by current pharmacy? Yes  Does patient experience delays in picking up medications due to transportation concerns? No   Compliance/Adherence/Medication fill history: Care Gaps: COVID Booster - last 06/12/19 DEXA Scan, ordered, pending scheduling  Star-Rating Drugs: Atorvastatin 40mg  PDC 99% Losartan 50mg  PDC 99%   Assessment/Plan Hypertension (BP goal <140/90) -Uncontrolled -Current treatment: Lasix 20mg  1qd prn edema Appropriate, Effective, Safe, Accessible Losartan 50mg  1 qd Appropriate, Effective,  Safe, Accessible Metoprolol XL 50mg  1 qd with a meal Appropriate, Effective, Safe, Accessible -Medications previously tried: Bisoprolol/HCTZ, Lisinopril, Bystolic -Current home readings: has a BP machine at home -Current dietary habits: mindful of salt intake -Current exercise habits: see below -Denies hypotensive/hypertensive symptoms -Educated on BP goals and benefits of medications for prevention of heart attack, stroke and kidney damage; Daily salt intake goal < 2300 mg; Exercise goal of 150 minutes per week; Importance of home blood pressure monitoring; -Counseled to monitor BP at home weekly, document, and provide log at future appointments -Recommended to continue current medication  Hyperlipidemia: (LDL goal < 70) -Controlled -Current treatment: Atorvastatin 40mg  1 qd Appropriate, Effective, Safe, Accessible Fenofibrate 145mg  1 qd Appropriate, Effective, Safe, Accessible -Medications previously tried: Pravastatin, Crestor  -Current dietary patterns: tries to eat plenty of vegetables -Current exercise habits: limited right now due to leg numbness -Educated on Cholesterol goals;  Benefits of statin for ASCVD risk reduction; Importance of limiting foods high in cholesterol; Exercise goal of 150 minutes per week; -Recommended to continue current medication    Sherrill Raring Clinical Pharmacist [phone]

## 2022-07-12 ENCOUNTER — Telehealth: Payer: Self-pay

## 2022-07-12 NOTE — Progress Notes (Signed)
Care Management & Coordination Services Pharmacy Team  Reason for Encounter: Appointment Reminder  Contacted patient to confirm telephone appointment with Delano Metz, PharmD on 07/15/2022 at 1:00. Unsuccessful outreach. Left voicemail for patient to return call.   Care Gaps: AWV - completed 06/17/2022 Last BP - 140/70 on 07/10/2022 Last A1C - 6.0 on 02/13/2022 Dexa - never done Covid - overdue Shingrix - postponed Colonoscopy - postponed   Star Rating Drugs: Atorvastatin 40 mg - last filled 06/17/2022 30 DS at [facility] Losartan  50 mg - last filled 06/17/2022 30 DS at [facility]  Inetta Fermo [facility]  Clinical Pharmacist Assistant [phone]

## 2022-07-15 ENCOUNTER — Encounter: Payer: Self-pay | Admitting: Adult Health

## 2022-07-15 ENCOUNTER — Ambulatory Visit: Payer: Medicare Other

## 2022-07-15 NOTE — Telephone Encounter (Signed)
error 

## 2022-07-17 ENCOUNTER — Other Ambulatory Visit: Payer: Self-pay | Admitting: Internal Medicine

## 2022-07-17 ENCOUNTER — Telehealth: Payer: Self-pay

## 2022-07-17 NOTE — Telephone Encounter (Signed)
Tried to call pt to see if her dentist could send her something in until her appt. But no answer.

## 2022-07-18 ENCOUNTER — Ambulatory Visit (INDEPENDENT_AMBULATORY_CARE_PROVIDER_SITE_OTHER): Payer: Medicare Other | Admitting: "Endocrinology

## 2022-07-18 ENCOUNTER — Encounter: Payer: Self-pay | Admitting: Adult Health

## 2022-07-18 ENCOUNTER — Ambulatory Visit (INDEPENDENT_AMBULATORY_CARE_PROVIDER_SITE_OTHER): Payer: Medicare Other

## 2022-07-18 ENCOUNTER — Encounter: Payer: Self-pay | Admitting: "Endocrinology

## 2022-07-18 ENCOUNTER — Ambulatory Visit (INDEPENDENT_AMBULATORY_CARE_PROVIDER_SITE_OTHER): Payer: Medicare Other | Admitting: Adult Health

## 2022-07-18 VITALS — BP 138/80 | HR 63 | Ht 70.0 in | Wt 179.4 lb

## 2022-07-18 VITALS — BP 160/80 | HR 77 | Temp 98.2°F | Ht 70.0 in | Wt 179.0 lb

## 2022-07-18 DIAGNOSIS — M5416 Radiculopathy, lumbar region: Secondary | ICD-10-CM

## 2022-07-18 DIAGNOSIS — E0590 Thyrotoxicosis, unspecified without thyrotoxic crisis or storm: Secondary | ICD-10-CM | POA: Diagnosis not present

## 2022-07-18 DIAGNOSIS — K0889 Other specified disorders of teeth and supporting structures: Secondary | ICD-10-CM | POA: Diagnosis not present

## 2022-07-18 DIAGNOSIS — F17200 Nicotine dependence, unspecified, uncomplicated: Secondary | ICD-10-CM

## 2022-07-18 DIAGNOSIS — M5441 Lumbago with sciatica, right side: Secondary | ICD-10-CM | POA: Diagnosis not present

## 2022-07-18 DIAGNOSIS — M4126 Other idiopathic scoliosis, lumbar region: Secondary | ICD-10-CM | POA: Diagnosis not present

## 2022-07-18 MED ORDER — PENICILLIN V POTASSIUM 500 MG PO TABS
500.0000 mg | ORAL_TABLET | Freq: Three times a day (TID) | ORAL | 0 refills | Status: AC
Start: 2022-07-18 — End: 2022-07-28

## 2022-07-18 MED ORDER — METHYLPREDNISOLONE 4 MG PO TBPK
ORAL_TABLET | ORAL | 0 refills | Status: DC
Start: 2022-07-18 — End: 2022-10-01

## 2022-07-18 MED ORDER — METHIMAZOLE 10 MG PO TABS: 20.0000 mg | ORAL_TABLET | Freq: Every evening | ORAL | 0 refills | Status: DC

## 2022-07-18 NOTE — Progress Notes (Signed)
Subjective:    Patient ID: Laurie [name], female    DOB: [birth year], 67 y.o.   MRN: [id number]  HPI  67 year old female who  has a past medical history of Allergy, Anxiety, Arthritis, Asthma, Chronic back pain, COPD (chronic obstructive pulmonary disease) (HCC), Depression, Esophageal stricture, GERD (gastroesophageal reflux disease), Heart murmur, Hematemesis (09/10/2018), Hyperlipidemia, Hypertension, IBS (irritable bowel syndrome), Interstitial cystitis, Neuromuscular disorder (HCC), and PONV (postoperative nausea and vomiting).  She presents to the office today for multiple issues.  The issue is that of low back pain with pain numbness that radiate down right leg.  She reports that she fell in the kitchen about 2 months ago( she was seen for the fall in this office) few days after being seen in the office she started to develop low back pain with radiating pain down the outside of her right  leg as well as numbness and the toes of her right foot.  Symptoms have not resolved, they are no better but no worse either.  Sitting, standing, and walking exacerbate symptoms.  Furthermore she continues to have dental abscesses.  She does report that she has talked to 1 dentistry as well as Therapist, sports and they want upwards of $[phone],000 to pull her teeth.  He has developed chills over the last 2 days and is worried that is coming from a dental abscess.  She has pain throughout her mouth is exacerbated by chewing.  Review of Systems See HPI   Past Medical History:  Diagnosis Date   Allergy    Anxiety    Arthritis    RA   Asthma    Chronic back pain    COPD (chronic obstructive pulmonary disease) (HCC)    Depression    Esophageal stricture    GERD (gastroesophageal reflux disease)    Heart murmur    Hematemesis 09/10/2018   Hyperlipidemia    Hypertension    IBS (irritable bowel syndrome)    Interstitial cystitis    Neuromuscular disorder (HCC)    fibromyalgia   PONV  (postoperative nausea and vomiting)     Social History   Socioeconomic History   Marital status: Single    Spouse name: Not on file   Number of children: Not on file   Years of education: Not on file   Highest education level: Not on file  Occupational History   Occupation: disability  Tobacco Use   Smoking status: Every Day    Packs/day: .5    Types: Cigarettes   Smokeless tobacco: Never   Tobacco comments:    trying to quit   Vaping Use   Vaping Use: Never used  Substance and Sexual Activity   Alcohol use: Never    Comment: occasional   Drug use: Yes    Types: Marijuana    Comment: occas   Sexual activity: Not on file  Other Topics Concern   Not on file  Social History Narrative   Not on file   Social Determinants of Health   Financial Resource Strain: Low Risk  (06/17/2022)   Overall Financial Resource Strain (CARDIA)    Difficulty of Paying Living Expenses: Not hard at all  Food Insecurity: No Food Insecurity (07/15/2022)   Hunger Vital Sign    Worried About Running Out of Food in the Last Year: Never true    Ran Out of Food in the Last Year: Never true  Transportation Needs: No Transportation Needs (06/17/2022)   PRAPARE - Transportation  Lack of Transportation (Medical): No    Lack of Transportation (Non-Medical): No  Physical Activity: Sufficiently Active (06/17/2022)   Exercise Vital Sign    Days of Exercise per Week: 7 days    Minutes of Exercise per Session: 30 min  Stress: No Stress Concern Present (06/17/2022)   [employer] of Occupational Health - Occupational Stress Questionnaire    Feeling of Stress : Not at all  Social Connections: Socially Integrated (06/17/2022)   Social Connection and Isolation Panel [NHANES]    Frequency of Communication with Friends and Family: More than three times a week    Frequency of Social Gatherings with Friends and Family: More than three times a week    Attends Religious Services: More than 4 times per year     Active Member of Clubs or Organizations: Yes    Attends Banker Meetings: More than 4 times per year    Marital Status: Living with partner  Intimate Partner Violence: Not At Risk (06/17/2022)   Humiliation, Afraid, Rape, and Kick questionnaire    Fear of Current or Ex-Partner: No    Emotionally Abused: No    Physically Abused: No    Sexually Abused: No    Past Surgical History:  Procedure Laterality Date   ABDOMINAL HYSTERECTOMY     APPENDECTOMY     BIOPSY  09/11/2018   Procedure: BIOPSY;  Surgeon: Meridee Score, Netty Starring., MD;  Location: [facility];  Service: Gastroenterology;;   CERVICAL FUSION     CERVICAL LAMINECTOMY     CORONARY STENT INTERVENTION N/A 10/07/2019   Procedure: CORONARY STENT INTERVENTION;  Surgeon: Kathleene Hazel, MD;  Location: [facility];  Service: Cardiovascular;  Laterality: N/A;   ESOPHAGOGASTRODUODENOSCOPY     ESOPHAGOGASTRODUODENOSCOPY (EGD) WITH PROPOFOL N/A 09/11/2018   Procedure: ESOPHAGOGASTRODUODENOSCOPY (EGD) WITH PROPOFOL;  Surgeon: Meridee Score Netty Starring., MD;  Location: [facility] ENDOSCOPY;  Service: Gastroenterology;  Laterality: N/A;   FINGER SURGERY     KYPHOPLASTY N/A 03/29/2021   Procedure: Lumbar Three KYPHOPLASTY;  Surgeon: Barnett Abu, MD;  Location: [facility] OR;  Service: Neurosurgery;  Laterality: N/A;   LEFT HEART CATH AND CORONARY ANGIOGRAPHY N/A 10/07/2019   Procedure: LEFT HEART CATH AND CORONARY ANGIOGRAPHY;  Surgeon: Kathleene Hazel, MD;  Location: [facility];  Service: Cardiovascular;  Laterality: N/A;   LEFT HEART CATH AND CORONARY ANGIOGRAPHY N/A 06/20/2022   Procedure: LEFT HEART CATH AND CORONARY ANGIOGRAPHY;  Surgeon: Kathleene Hazel, MD;  Location: [facility];  Service: Cardiovascular;  Laterality: N/A;   LUMBAR FUSION     SAVORY DILATION N/A 09/11/2018   Procedure: SAVORY DILATION;  Surgeon: Meridee Score Netty Starring., MD;  [facility];  Service: Gastroenterology;   Laterality: N/A;   TONSILLECTOMY      Family History  Problem Relation Age of Onset   Dementia Mother    Heart attack Father    Coronary artery disease Father    Cancer Brother        esophageal   Esophageal cancer Brother    Coronary artery disease Paternal Aunt    Stomach cancer Paternal Aunt    Coronary artery disease Paternal Grandmother    Aneurysm Brother        aortic   Rectal cancer Neg Hx    Colon cancer Neg Hx    Thyroid disease Neg Hx     Allergies  Allergen Reactions   Codeine Nausea And Vomiting   Lisinopril Cough   Sulfonamide Derivatives Nausea And  Vomiting   Tetracyclines & Related Rash    Current Outpatient Medications on File Prior to Visit  Medication Sig Dispense Refill   albuterol (VENTOLIN HFA) 108 (90 Base) MCG/ACT inhaler INHALE TWO PUFFS BY MOUTH INTO LUNGS every SIX hours AS NEEDED FOR WHEEZING AND/OR SHORTNESS OF BREATH (Patient taking differently: Inhale 2 puffs into the lungs every 6 (six) hours as needed for shortness of breath.) 8.5 g 1   aspirin EC 81 MG tablet Take 1 tablet (81 mg total) by mouth daily. Swallow whole. 90 tablet 3   atorvastatin (LIPITOR) 40 MG tablet Take 1 tablet (40 mg total) by mouth every evening. 90 tablet 3   Blood Pressure Monitoring DEVI Use as needed DX I10 1 each 1   clopidogrel (PLAVIX) 75 MG tablet Take 1 tablet (75 mg total) by mouth daily. 90 tablet 3   fenofibrate (TRICOR) 145 MG tablet Take 1 tablet (145 mg total) by mouth daily. 90 tablet 3   fluticasone (FLONASE) 50 MCG/ACT nasal spray instill two SPRAYS in each nostril daily 16 g 6   furosemide (LASIX) 20 MG tablet TAKE ONE TABLET BY MOUTH ONCE daily AS NEEDED FOR LEG EDEMA 30 tablet 3   lidocaine (XYLOCAINE) 2 % solution Apply 5 ml  around gum of affected teeth up to 4 times per day. 100 mL 0   losartan (COZAAR) 50 MG tablet TAKE ONE TABLET BY MOUTH EVERY MORNING (Patient taking differently: Take 50 mg by mouth daily.) 90 tablet 1   methimazole (TAPAZOLE)  10 MG tablet Take 2 tablets (20 mg total) by mouth every evening. 30 tablet 0   metoprolol succinate (TOPROL-XL) 50 MG 24 hr tablet Take 1 tablet (50 mg total) by mouth daily. Take with or immediately following a meal. 90 tablet 1   ondansetron (ZOFRAN-ODT) 8 MG disintegrating tablet TAKE ONE TABLET BY MOUTH every EIGHT hours AS NEEDED FOR NAUSEA AND VOMITING (Patient taking differently: Take 8 mg by mouth every 8 (eight) hours as needed for vomiting or nausea.) 20 tablet 3   pantoprazole (PROTONIX) 40 MG tablet TAKE ONE TABLET BY MOUTH EVERY MORNING (Patient taking differently: Take 40 mg by mouth daily.) 90 tablet 3   venlafaxine XR (EFFEXOR-XR) 150 MG 24 hr capsule TAKE ONE CAPSULE BY MOUTH EVERY MORNING (Patient taking differently: Take 150 mg by mouth daily with breakfast.) 90 capsule 1   Vitamin D, Ergocalciferol, (DRISDOL) 1.25 MG (50000 UNIT) CAPS capsule Take 1 capsule (50,000 Units total) by mouth every 7 (seven) days. 25 capsule 0   nitroGLYCERIN (NITROSTAT) 0.4 MG SL tablet Place 1 tablet (0.4 mg total) under the tongue every 5 (five) minutes as needed for chest pain. (Patient not taking: Reported on 06/20/2022) 25 tablet 1   No current facility-administered medications on file prior to visit.    BP (!) 160/80   Pulse 77   Temp 98.2 F (36.8 C) (Oral)   Ht 5\' 10"  (1.778 m)   Wt 179 lb (81.2 kg)   SpO2 95%   BMI 25.68 kg/m       Objective:   Physical Exam Vitals and nursing note reviewed.  Constitutional:      Appearance: Normal appearance.  HENT:     Mouth/Throat:     Dentition: Abnormal dentition. Dental caries and dental abscesses present.  Cardiovascular:     Rate and Rhythm: Normal rate and regular rhythm.     Pulses: Normal pulses.     Heart sounds: Normal heart sounds.  Musculoskeletal:  Back:     Right lower leg: Normal.     Right foot: Normal.  Lymphadenopathy:     Head:     Right side of head: No submental, submandibular, tonsillar, preauricular,  posterior auricular or occipital adenopathy.     Left side of head: No submental, submandibular, tonsillar, preauricular, posterior auricular or occipital adenopathy.  Neurological:     General: No focal deficit present.     Mental Status: She is alert and oriented to person, place, and time.  Psychiatric:        Mood and Affect: Mood normal.        Behavior: Behavior normal.        Thought Content: Thought content normal.        Judgment: Judgment normal.       Assessment & Plan:  1. Lumbar radiculopathy - Will get xray today and send in medrol dose pack to help with symptoms. We may need advanced imaging and referral to PT or orthopedics  - methylPREDNISolone (MEDROL DOSEPAK) 4 MG TBPK tablet; Take as directed  Dispense: 21 tablet; Refill: 0 - [facility] Lumbar Spine Complete; Future  2. Pain, dental -He was advised that insurance is no longer covering dental issues and this would likely not be covered.  She is okay with that due to the amount of pain she is having from the dental abscesses.  Does not seem reasonable that dentist would be charging that much just for simple tooth extractions.  She was advised to call them again and see what their cash price for dental extractions are.  He was also given information for the [facility] dental clinic. - penicillin v potassium (VEETID) 500 MG tablet; Take 1 tablet (500 mg total) by mouth 3 (three) times daily for 10 days.  Dispense: 30 tablet; Refill: 0  Shirline Frees, NP

## 2022-07-18 NOTE — Progress Notes (Signed)
Outpatient Endocrinology Note Altamese [place], MD  07/18/22   Laurie [name] 09/01/1955 [id number]  Referring Provider: Shirline Frees, NP Primary Care Provider: Shirline Frees, NP Subjective  No chief complaint on file.   Assessment & Plan  Diagnoses and all orders for this visit:  Hyperthyroidism -     T3, free; Future -     T4, free; Future -     TSH; Future -     Thyroid stimulating immunoglobulin; Future -     TRAb (TSH Receptor Binding Antibody); Future -     US THYROID; Future -     Ambulatory referral to Ophthalmology  Smoking  Other orders -     methimazole (TAPAZOLE) 10 MG tablet; Take 2 tablets (20 mg total) by mouth every evening.     Laurie [name] is currently not taking anything. Ran out of methimazole 20 mg qd a week ago. Missed her appt, had no refill. Patient currently varying between hot and cold and feeling sweaty.  Educated on thyroid axis.  Discussed the etiology for hyperthyroidism. Educated on thyroid axis.  Recommend the following: Resume current dose. Repeat labs in 2 weeks before next appt to assess the dose adequacy. Repeat labs  sooner if symptoms of hyper or hypothyroidism develop.  Pt previously decided on medication rather than RAI therapy and surgery.  Counseled on: -complications of untreated hyperthyroidism including atrial fibrillation, heart failure and osteoporosis -side effects of Methimazole including but not limited to allergic reaction, rash, bone marrow suppression, liver dysfunction -compliance and follow up needs    If you notice any symptoms of worsening fatigue, fever with sore throat, loss of appetite, yellowing of eyes, dark urine, joint pains, sores in the mouth, itchy rash, light colored stools or abdominal pain, please stop the medication and call us immediately as this can be a serious side effect of the medication.  Discussed association of smoking with thyroid disease Encouraged quitting Pt  understands and agreed  Ordered eye referral to assess eyes (watering, pain, burning, sand/gritty sensation and swelling under eyes)  Ordered thyroid ultrasound to assess for thyromegaly report in CT angio neck done in 2023. Pt open to FNA if needed.    I have reviewed current medications, nurse's notes, allergies, vital signs, past medical and surgical history, family medical history, and social history for this encounter. Counseled patient on symptoms, examination findings, lab findings, imaging results, treatment decisions and monitoring and prognosis. The patient understood the recommendations and agrees with the treatment plan. All questions regarding treatment plan were fully answered.   Return in about 2 weeks (around 08/01/2022) for visit + labs before next visit.   Altamese [place], MD  07/18/22   I have reviewed current medications, nurse's notes, allergies, vital signs, past medical and surgical history, family medical history, and social history for this encounter. Counseled patient on symptoms, examination findings, lab findings, imaging results, treatment decisions and monitoring and prognosis. The patient understood the recommendations and agrees with the treatment plan. All questions regarding treatment plan were fully answered.   History of Present Illness Laurie [name] is a 67 y.o. year old female who presents to our clinic with hyperthyroidism diagnosed in 2021.     Symptoms suggestive of HYPERTHYROIDISM:  weight loss  No heat intolerance No hyperdefecation  No palpitations  No  Compressive symptoms:  dysphagia  Yes-history of esophageal dilation thrice dysphonia  Yes positional dyspnea (especially with simultaneous arms elevation)  No  Smokes  Yes On  biotin  No Personal history of head/neck surgery/irradiation-neck surgery for ruptured disc  Adverse Drug Effects from Methimazole (MMI): none  Grave's Ophthalmopathy Clinical Activity Score: 2/9, feels  burning and gritty sensation in eyes  03/2021 CT ANGIOGRAPHY NECK  Chronically enlarged and heterogeneous thyroid (series 7, image 91).  Physical Exam  BP 138/80   Pulse 63   Ht 5\' 10"  (1.778 m)   Wt 179 lb 6.4 oz (81.4 kg)   SpO2 95%   BMI 25.74 kg/m  Constitutional: well developed, well nourished Head: normocephalic, atraumatic, no exophthalmos Eyes: sclera anicteric, no redness Neck: no thyromegaly, no thyroid tenderness; no nodules palpated Lungs: normal respiratory effort Neurology: alert and oriented, + fine hand tremor Skin: dry, no appreciable rashes Musculoskeletal: no appreciable defects Psychiatric: normal mood and affect  Allergies Allergies  Allergen Reactions   Codeine Nausea And Vomiting   Lisinopril Cough   Sulfonamide Derivatives Nausea And Vomiting   Tetracyclines & Related Rash    Current Medications Patient's Medications  New Prescriptions   No medications on file  Previous Medications   ALBUTEROL (VENTOLIN HFA) 108 (90 BASE) MCG/ACT INHALER    INHALE TWO PUFFS BY MOUTH INTO LUNGS every SIX hours AS NEEDED FOR WHEEZING AND/OR SHORTNESS OF BREATH   ASPIRIN EC 81 MG TABLET    Take 1 tablet (81 mg total) by mouth daily. Swallow whole.   ATORVASTATIN (LIPITOR) 40 MG TABLET    Take 1 tablet (40 mg total) by mouth every evening.   BLOOD PRESSURE MONITORING DEVI    Use as needed DX I10   CLOPIDOGREL (PLAVIX) 75 MG TABLET    Take 1 tablet (75 mg total) by mouth daily.   FENOFIBRATE (TRICOR) 145 MG TABLET    Take 1 tablet (145 mg total) by mouth daily.   FLUTICASONE (FLONASE) 50 MCG/ACT NASAL SPRAY    instill two SPRAYS in each nostril daily   FUROSEMIDE (LASIX) 20 MG TABLET    TAKE ONE TABLET BY MOUTH ONCE daily AS NEEDED FOR LEG EDEMA   LIDOCAINE (XYLOCAINE) 2 % SOLUTION    Apply 5 ml  around gum of affected teeth up to 4 times per day.   LOSARTAN (COZAAR) 50 MG TABLET    TAKE ONE TABLET BY MOUTH EVERY MORNING   METOPROLOL SUCCINATE (TOPROL-XL) 50 MG 24 HR  TABLET    Take 1 tablet (50 mg total) by mouth daily. Take with or immediately following a meal.   NITROGLYCERIN (NITROSTAT) 0.4 MG SL TABLET    Place 1 tablet (0.4 mg total) under the tongue every 5 (five) minutes as needed for chest pain.   ONDANSETRON (ZOFRAN-ODT) 8 MG DISINTEGRATING TABLET    TAKE ONE TABLET BY MOUTH every EIGHT hours AS NEEDED FOR NAUSEA AND VOMITING   PANTOPRAZOLE (PROTONIX) 40 MG TABLET    TAKE ONE TABLET BY MOUTH EVERY MORNING   VENLAFAXINE XR (EFFEXOR-XR) 150 MG 24 HR CAPSULE    TAKE ONE CAPSULE BY MOUTH EVERY MORNING   VITAMIN D, ERGOCALCIFEROL, (DRISDOL) 1.25 MG (50000 UNIT) CAPS CAPSULE    Take 1 capsule (50,000 Units total) by mouth every 7 (seven) days.  Modified Medications   Modified Medication Previous Medication   METHIMAZOLE (TAPAZOLE) 10 MG TABLET methimazole (TAPAZOLE) 10 MG tablet      Take 2 tablets (20 mg total) by mouth every evening.    TAKE TWO TABLETS BY MOUTH EVERY EVENING  Discontinued Medications   No medications on file    Past Medical History Past  Medical History:  Diagnosis Date   Allergy    Anxiety    Arthritis    RA   Asthma    Chronic back pain    COPD (chronic obstructive pulmonary disease) (HCC)    Depression    Esophageal stricture    GERD (gastroesophageal reflux disease)    Heart murmur    Hematemesis 09/10/2018   Hyperlipidemia    Hypertension    IBS (irritable bowel syndrome)    Interstitial cystitis    Neuromuscular disorder (HCC)    fibromyalgia   PONV (postoperative nausea and vomiting)     Past Surgical History Past Surgical History:  Procedure Laterality Date   ABDOMINAL HYSTERECTOMY     APPENDECTOMY     BIOPSY  09/11/2018   Procedure: BIOPSY;  Surgeon: Lemar Lofty., MD;  Location: [facility] ENDOSCOPY;  Service: Gastroenterology;;   CERVICAL FUSION     CERVICAL LAMINECTOMY     CORONARY STENT INTERVENTION N/A 10/07/2019   Procedure: CORONARY STENT INTERVENTION;  Surgeon: Kathleene Hazel, MD;   Location: [facility] INVASIVE CV LAB;  Service: Cardiovascular;  Laterality: N/A;   ESOPHAGOGASTRODUODENOSCOPY     ESOPHAGOGASTRODUODENOSCOPY (EGD) WITH PROPOFOL N/A 09/11/2018   Procedure: ESOPHAGOGASTRODUODENOSCOPY (EGD) WITH PROPOFOL;  Surgeon: Meridee Score Netty Starring., MD;  Location: [facility] ENDOSCOPY;  Service: Gastroenterology;  Laterality: N/A;   FINGER SURGERY     KYPHOPLASTY N/A 03/29/2021   Procedure: Lumbar Three KYPHOPLASTY;  Surgeon: Barnett Abu, MD;  Location: [facility] OR;  Service: Neurosurgery;  Laterality: N/A;   LEFT HEART CATH AND CORONARY ANGIOGRAPHY N/A 10/07/2019   Procedure: LEFT HEART CATH AND CORONARY ANGIOGRAPHY;  Surgeon: Kathleene Hazel, MD;  Location: [facility] INVASIVE CV LAB;  Service: Cardiovascular;  Laterality: N/A;   LEFT HEART CATH AND CORONARY ANGIOGRAPHY N/A 06/20/2022   Procedure: LEFT HEART CATH AND CORONARY ANGIOGRAPHY;  Surgeon: Kathleene Hazel, MD;  Location: [facility] INVASIVE CV LAB;  Service: Cardiovascular;  Laterality: N/A;   LUMBAR FUSION     SAVORY DILATION N/A 09/11/2018   Procedure: SAVORY DILATION;  Surgeon: Meridee Score Netty Starring., MD;  Location: [facility] ENDOSCOPY;  Service: Gastroenterology;  Laterality: N/A;   TONSILLECTOMY      Family History family history includes Aneurysm in her brother; Cancer in her brother; Coronary artery disease in her father, paternal aunt, and paternal grandmother; Dementia in her mother; Esophageal cancer in her brother; Heart attack in her father; Stomach cancer in her paternal aunt.  Social History Social History   Socioeconomic History   Marital status: Single    Spouse name: Not on file   Number of children: Not on file   Years of education: Not on file   Highest education level: Not on file  Occupational History   Occupation: disability  Tobacco Use   Smoking status: Every Day    Packs/day: .5    Types: Cigarettes   Smokeless tobacco: Never   Tobacco comments:    trying to quit   Vaping Use   Vaping Use: Never used   Substance and Sexual Activity   Alcohol use: Never    Comment: occasional   Drug use: Yes    Types: Marijuana    Comment: occas   Sexual activity: Not on file  Other Topics Concern   Not on file  Social History Narrative   Not on file   Social Determinants of Health   Financial Resource Strain: Low Risk  (06/17/2022)   Overall Financial Resource Strain (CARDIA)    Difficulty of Paying Living  Expenses: Not hard at all  Food Insecurity: No Food Insecurity (07/15/2022)   Hunger Vital Sign    Worried About Running Out of Food in the Last Year: Never true    Ran Out of Food in the Last Year: Never true  Transportation Needs: No Transportation Needs (06/17/2022)   PRAPARE - Administrator, Civil Service (Medical): No    Lack of Transportation (Non-Medical): No  Physical Activity: Sufficiently Active (06/17/2022)   Exercise Vital Sign    Days of Exercise per Week: 7 days    Minutes of Exercise per Session: 30 min  Stress: No Stress Concern Present (06/17/2022)   [employer] of Occupational Health - Occupational Stress Questionnaire    Feeling of Stress : Not at all  Social Connections: Socially Integrated (06/17/2022)   Social Connection and Isolation Panel [NHANES]    Frequency of Communication with Friends and Family: More than three times a week    Frequency of Social Gatherings with Friends and Family: More than three times a week    Attends Religious Services: More than 4 times per year    Active Member of Clubs or Organizations: Yes    Attends Banker Meetings: More than 4 times per year    Marital Status: Living with partner  Intimate Partner Violence: Not At Risk (06/17/2022)   Humiliation, Afraid, Rape, and Kick questionnaire    Fear of Current or Ex-Partner: No    Emotionally Abused: No    Physically Abused: No    Sexually Abused: No    Laboratory Investigations Lab Results  Component Value Date   TSH 1.53 02/13/2022   TSH 2.94 04/25/2021    TSH 0.04 (L) 04/26/2020   FREET4 0.69 04/25/2021   FREET4 0.69 04/26/2020   FREET4 1.56 03/13/2020     No results found for: "TSI"   No components found for: "TRAB"   Lab Results  Component Value Date   CHOL 120 02/13/2022   Lab Results  Component Value Date   HDL 43.50 02/13/2022   Lab Results  Component Value Date   LDLCALC 59 02/13/2022   Lab Results  Component Value Date   TRIG 88.0 02/13/2022   Lab Results  Component Value Date   CHOLHDL 3 02/13/2022   Lab Results  Component Value Date   CREATININE 1.25 (H) 06/10/2022   Lab Results  Component Value Date   GFR 63.72 02/13/2022      Component Value Date/Time   NA 142 06/10/2022 1559   K 4.5 06/10/2022 1559   CL 102 06/10/2022 1559   CO2 23 06/10/2022 1559   GLUCOSE 84 06/10/2022 1559   GLUCOSE 80 02/13/2022 1332   BUN 22 06/10/2022 1559   CREATININE 1.25 (H) 06/10/2022 1559   CALCIUM 9.3 06/10/2022 1559   PROT 7.3 02/13/2022 1332   PROT 6.7 10/08/2021 0000   ALBUMIN 4.2 02/13/2022 1332   ALBUMIN 4.3 10/08/2021 0000   AST 14 02/13/2022 1332   ALT 12 02/13/2022 1332   ALKPHOS 52 02/13/2022 1332   BILITOT 0.3 02/13/2022 1332   BILITOT <0.2 10/08/2021 0000   GFRNONAA >60 03/29/2021 1309   GFRAA 76 09/27/2019 1026      Latest Ref Rng & Units 06/10/2022    3:59 PM 02/13/2022    1:32 PM 10/08/2021   12:00 AM  BMP  Glucose 70 - 99 mg/dL 84  80  960   BUN 8 - 27 mg/dL 22  19  24  Creatinine 0.57 - 1.00 mg/dL 7.82  9.56  2.13   BUN/Creat Ratio 12 - 28 18   24    Sodium 134 - 144 mmol/L 142  138  140   Potassium 3.5 - 5.2 mmol/L 4.5  3.9  4.7   Chloride 96 - 106 mmol/L 102  100  103   CO2 20 - 29 mmol/L 23  29  27    Calcium 8.7 - 10.3 mg/dL 9.3  9.5  9.2        Component Value Date/Time   WBC 9.2 06/10/2022 1559   WBC 10.5 02/13/2022 1332   RBC 4.43 06/10/2022 1559   RBC 4.28 02/13/2022 1332   HGB 12.4 06/10/2022 1559   HCT 38.1 06/10/2022 1559   PLT 316 06/10/2022 1559   MCV 86 06/10/2022 1559    MCH 28.0 06/10/2022 1559   MCH 28.5 03/29/2021 1309   MCHC 32.5 06/10/2022 1559   MCHC 33.4 02/13/2022 1332   RDW 13.0 06/10/2022 1559   LYMPHSABS 2.6 02/13/2022 1332   LYMPHSABS 2.7 09/27/2019 1026   MONOABS 0.8 02/13/2022 1332   EOSABS 0.4 02/13/2022 1332   EOSABS 0.5 (H) 09/27/2019 1026   BASOSABS 0.1 02/13/2022 1332   BASOSABS 0.1 09/27/2019 1026      Parts of this note may have been dictated using voice recognition software. There may be variances in spelling and vocabulary which are unintentional. Not all errors are proofread. Please notify the Thereasa Parkin if any discrepancies are noted or if the meaning of any statement is not clear.

## 2022-07-25 ENCOUNTER — Other Ambulatory Visit (INDEPENDENT_AMBULATORY_CARE_PROVIDER_SITE_OTHER): Payer: Medicare Other

## 2022-07-25 ENCOUNTER — Ambulatory Visit (HOSPITAL_BASED_OUTPATIENT_CLINIC_OR_DEPARTMENT_OTHER)
Admission: RE | Admit: 2022-07-25 | Discharge: 2022-07-25 | Disposition: A | Discharge status: Home / Self Care | Payer: Medicare Other | Source: Ambulatory Visit | Attending: "Endocrinology | Admitting: "Endocrinology

## 2022-07-25 DIAGNOSIS — E0590 Thyrotoxicosis, unspecified without thyrotoxic crisis or storm: Secondary | ICD-10-CM | POA: Insufficient documentation

## 2022-07-25 DIAGNOSIS — E041 Nontoxic single thyroid nodule: Secondary | ICD-10-CM | POA: Diagnosis not present

## 2022-07-25 LAB — TSH: TSH: 6.05 u[IU]/mL — ABNORMAL HIGH (ref 0.35–5.50)

## 2022-07-25 LAB — T4, FREE: Free T4: 0.93 ng/dL (ref 0.60–1.60)

## 2022-07-25 LAB — T3, FREE: T3, Free: 2.6 pg/mL (ref 2.3–4.2)

## 2022-07-26 ENCOUNTER — Other Ambulatory Visit: Payer: Self-pay | Admitting: Adult Health

## 2022-07-26 ENCOUNTER — Telehealth: Payer: Self-pay | Admitting: Adult Health

## 2022-07-26 DIAGNOSIS — M5416 Radiculopathy, lumbar region: Secondary | ICD-10-CM

## 2022-07-26 NOTE — Telephone Encounter (Signed)
Pt called to request a call back to go over her lab results. 

## 2022-07-26 NOTE — Telephone Encounter (Signed)
Left message to return phone call.

## 2022-07-26 NOTE — Telephone Encounter (Signed)
Patient notified of update  and verbalized understanding. 

## 2022-07-27 LAB — TRAB (TSH RECEPTOR BINDING ANTIBODY): TRAB: 7.71 IU/L — ABNORMAL HIGH (ref <=2.00)

## 2022-07-29 LAB — THYROID STIMULATING IMMUNOGLOBULIN: TSI: 442 % baseline — ABNORMAL HIGH (ref <140)

## 2022-08-01 ENCOUNTER — Ambulatory Visit: Payer: Medicare Other | Admitting: "Endocrinology

## 2022-08-01 ENCOUNTER — Telehealth: Payer: Self-pay | Admitting: "Endocrinology

## 2022-08-01 ENCOUNTER — Other Ambulatory Visit: Payer: Self-pay | Admitting: Adult Health

## 2022-08-01 NOTE — Telephone Encounter (Signed)
Patient called re: results of imaging - did not realize she missed appt this morning. Made her aware and rescheduled.  Patient requesting call back about results of imaging [phone]

## 2022-08-03 ENCOUNTER — Other Ambulatory Visit: Payer: Self-pay | Admitting: Adult Health

## 2022-08-05 ENCOUNTER — Telehealth (INDEPENDENT_AMBULATORY_CARE_PROVIDER_SITE_OTHER): Payer: Medicare Other | Admitting: "Endocrinology

## 2022-08-05 ENCOUNTER — Encounter: Payer: Self-pay | Admitting: "Endocrinology

## 2022-08-05 VITALS — Ht 70.0 in | Wt 177.0 lb

## 2022-08-05 DIAGNOSIS — E0500 Thyrotoxicosis with diffuse goiter without thyrotoxic crisis or storm: Secondary | ICD-10-CM | POA: Diagnosis not present

## 2022-08-05 DIAGNOSIS — E042 Nontoxic multinodular goiter: Secondary | ICD-10-CM

## 2022-08-05 DIAGNOSIS — F17200 Nicotine dependence, unspecified, uncomplicated: Secondary | ICD-10-CM

## 2022-08-05 MED ORDER — METHIMAZOLE 10 MG PO TABS: 10.0000 mg | ORAL_TABLET | Freq: Every evening | ORAL | 1 refills | Status: DC

## 2022-08-05 NOTE — Progress Notes (Signed)
Outpatient Endocrinology Note Laurie Fairgarden, MD  08/05/22   Laurie [name] [birth year] [id number]  Referring Provider: Shirline Frees, NP Primary Care Provider: Shirline Frees, NP Subjective  No chief complaint on file.   Assessment & Plan  Diagnoses and all orders for this visit:  Graves disease -     methimazole (TAPAZOLE) 10 MG tablet; Take 1 tablet (10 mg total) by mouth every evening. -     TSH; Future -     T4, free; Future -     T3, free; Future -     [place] FNA BX THYROID 1ST LESION AFIRMA; Future -     [place] FNA BX THYROID 1ST LESION AFIRMA  Smoking  Multinodular goiter   Laurie [name] is currently not taking anything. Currently taking methimazole 20 mg qd a week ago.  Patient currently varying between hot and cold and feeling sweaty.  Educated on thyroid axis.  Discussed the etiology for hyperthyroidism. Educated on thyroid axis.  Recommend the following: Decrease Methimazole 10 mg to a week ago. Repeat labs in 2 months to assess the dose adequacy. Repeat labs  sooner if symptoms of hyper or hypothyroidism develop.  Pt previously decided on medication rather than RAI therapy and surgery.  Counseled on: -complications of untreated hyperthyroidism including atrial fibrillation, heart failure and osteoporosis -side effects of Methimazole including but not limited to allergic reaction, rash, bone marrow suppression, liver dysfunction -compliance and follow up needs    If you notice any symptoms of worsening fatigue, fever with sore throat, loss of appetite, yellowing of eyes, dark urine, joint pains, sores in the mouth, itchy rash, light colored stools or abdominal pain, please stop the medication and call us immediately as this can be a serious side effect of the medication.  The substances in cigarettes can affect the autoimmune processes underlying Graves' disease Encouraged quitting and contacting PCP for any help needed such as patches or pills to curb  nicotine dependence   Pt understands and agreed  Ordered eye referral to assess eyes (watering, pain, burning, sand/gritty sensation and swelling under eyes), has appointment in July 2024  Ordered thyroid ultrasound to assess for thyromegaly report in CT angio neck done in 2023. Pt open to FNA if needed. Thyroid ultrasound revealed left love thyromegaly due to TR3 inferior left thyroid lobe measuring 4.9 x 4.6 x 3.0 cm. Discussed, patient wants to proceed with FNA, ordered FNA.    I have reviewed current medications, nurse's notes, allergies, vital signs, past medical and surgical history, family medical history, and social history for this encounter. Counseled patient on symptoms, examination findings, lab findings, imaging results, treatment decisions and monitoring and prognosis. The patient understood the recommendations and agrees with the treatment plan. All questions regarding treatment plan were fully answered.  I connected with  Laurie [name] on 08/05/22 by a video enabled telemedicine application and verified that I am speaking with the correct person using two identifiers. The patient requested evaluation and management by telemedicine due to to not feeling well.   Total visit time was 10 min, with additional 10 min to evaluate, assess and record in the chart.    Return in about 8 weeks (around 10/02/2022) for visit + labs before next visit.   Laurie Freeland, MD  08/05/22   I have reviewed current medications, nurse's notes, allergies, vital signs, past medical and surgical history, family medical history, and social history for this encounter. Counseled patient on symptoms, examination findings, lab  findings, imaging results, treatment decisions and monitoring and prognosis. The patient understood the recommendations and agrees with the treatment plan. All questions regarding treatment plan were fully answered.   History of Present Illness Laurie [name] is a 67 y.o. year  old female who presents to our clinic with hyperthyroidism diagnosed in 2021.     Symptoms suggestive of HYPERTHYROIDISM:  weight loss  No heat intolerance No hyperdefecation  No palpitations  No  Compressive symptoms:  dysphagia  Yes-history of esophageal dilation thrice dysphonia  Yes positional dyspnea (especially with simultaneous arms elevation)  No  Smokes  Yes On biotin  No Personal history of head/neck surgery/irradiation-neck surgery for ruptured disc  Adverse Drug Effects from Methimazole (MMI): none  Grave's Ophthalmopathy Clinical Activity Score: 2/9, feels burning and gritty sensation in eyes  03/2021 CT ANGIOGRAPHY NECK  Chronically enlarged and heterogeneous thyroid (series 7, image 91).  07/26/22 Thyroid ultrasound reviewed images and report independently  Left lobe: 7.3 x 3.1 x 2.6 cm  Nodule labeled 1 is a large solid isoechoic TR 3 nodule occupying a majority of and expanding the inferior left thyroid lobe measuring 4.9 x 4.6 x 3.0 cm. **Given size (>/= 2.5 cm) and appearance, fine needle aspiration of this mildly suspicious nodule should be considered based on TI-RADS criteria.  Physical Exam  Ht 5\' 10"  (1.778 m)   Wt 177 lb (80.3 kg)   BMI 25.40 kg/m  Constitutional: well developed, well nourished Head: normocephalic, atraumatic, no exophthalmos Eyes: sclera anicteric, no redness Neck: no thyromegaly, no thyroid tenderness; no nodules palpated Lungs: normal respiratory effort Neurology: alert and oriented Skin: dry, no appreciable rashes Musculoskeletal: no appreciable defects Psychiatric: normal mood and affect  Allergies Allergies  Allergen Reactions   Codeine Nausea And Vomiting   Lisinopril Cough   Sulfonamide Derivatives Nausea And Vomiting   Tetracyclines & Related Rash    Current Medications Patient's Medications  New Prescriptions   No medications on file  Previous Medications   ALBUTEROL (VENTOLIN HFA) 108 (90 BASE) MCG/ACT  INHALER    INHALE TWO PUFFS BY MOUTH INTO LUNGS every SIX hours AS NEEDED FOR WHEEZING AND/OR SHORTNESS OF BREATH   ASPIRIN EC 81 MG TABLET    Take 1 tablet (81 mg total) by mouth daily. Swallow whole.   ATORVASTATIN (LIPITOR) 40 MG TABLET    Take 1 tablet (40 mg total) by mouth every evening.   BLOOD PRESSURE MONITORING DEVI    Use as needed DX I10   CLOPIDOGREL (PLAVIX) 75 MG TABLET    Take 1 tablet (75 mg total) by mouth daily.   FENOFIBRATE (TRICOR) 145 MG TABLET    Take 1 tablet (145 mg total) by mouth daily.   FLUTICASONE (FLONASE) 50 MCG/ACT NASAL SPRAY    instill two SPRAYS in each nostril daily   FUROSEMIDE (LASIX) 20 MG TABLET    TAKE ONE TABLET BY MOUTH ONCE daily AS NEEDED FOR LEG EDEMA   LIDOCAINE (XYLOCAINE) 2 % SOLUTION    Apply 5 ml  around gum of affected teeth up to 4 times per day.   LOSARTAN (COZAAR) 50 MG TABLET    TAKE ONE TABLET BY MOUTH EVERY MORNING   METHYLPREDNISOLONE (MEDROL DOSEPAK) 4 MG TBPK TABLET    Take as directed   METOPROLOL SUCCINATE (TOPROL-XL) 50 MG 24 HR TABLET    Take 1 tablet (50 mg total) by mouth daily. Take with or immediately following a meal.   NITROGLYCERIN (NITROSTAT) 0.4 MG SL TABLET  Place 1 tablet (0.4 mg total) under the tongue every 5 (five) minutes as needed for chest pain.   ONDANSETRON (ZOFRAN-ODT) 8 MG DISINTEGRATING TABLET    TAKE ONE TABLET BY MOUTH every EIGHT hours AS NEEDED FOR NAUSEA AND VOMITING   PANTOPRAZOLE (PROTONIX) 40 MG TABLET    TAKE ONE TABLET BY MOUTH EVERY MORNING   VENLAFAXINE XR (EFFEXOR-XR) 150 MG 24 HR CAPSULE    TAKE ONE CAPSULE BY MOUTH EVERY MORNING   VITAMIN D, ERGOCALCIFEROL, (DRISDOL) 1.25 MG (50000 UNIT) CAPS CAPSULE    Take 1 capsule (50,000 Units total) by mouth every 7 (seven) days.  Modified Medications   Modified Medication Previous Medication   METHIMAZOLE (TAPAZOLE) 10 MG TABLET methimazole (TAPAZOLE) 10 MG tablet      Take 1 tablet (10 mg total) by mouth every evening.    Take 2 tablets (20 mg total)  by mouth every evening.  Discontinued Medications   No medications on file    Past Medical History Past Medical History:  Diagnosis Date   Allergy    Anxiety    Arthritis    RA   Asthma    Chronic back pain    COPD (chronic obstructive pulmonary disease) (HCC)    Depression    Esophageal stricture    GERD (gastroesophageal reflux disease)    Heart murmur    Hematemesis 09/10/2018   Hyperlipidemia    Hypertension    IBS (irritable bowel syndrome)    Interstitial cystitis    Neuromuscular disorder (HCC)    fibromyalgia   PONV (postoperative nausea and vomiting)     Past Surgical History Past Surgical History:  Procedure Laterality Date   ABDOMINAL HYSTERECTOMY     APPENDECTOMY     BIOPSY  09/11/2018   Procedure: BIOPSY;  Surgeon: Lemar Lofty., MD;  Location: [facility] ENDOSCOPY;  Service: Gastroenterology;;   CERVICAL FUSION     CERVICAL LAMINECTOMY     CORONARY STENT INTERVENTION N/A 10/07/2019   Procedure: CORONARY STENT INTERVENTION;  Surgeon: Kathleene Hazel, MD;  Location: [facility] INVASIVE CV LAB;  Service: Cardiovascular;  Laterality: N/A;   ESOPHAGOGASTRODUODENOSCOPY     ESOPHAGOGASTRODUODENOSCOPY (EGD) WITH PROPOFOL N/A 09/11/2018   Procedure: ESOPHAGOGASTRODUODENOSCOPY (EGD) WITH PROPOFOL;  Surgeon: Meridee Score Netty Starring., MD;  Location: [facility] ENDOSCOPY;  Service: Gastroenterology;  Laterality: N/A;   FINGER SURGERY     KYPHOPLASTY N/A 03/29/2021   Procedure: Lumbar Three KYPHOPLASTY;  Surgeon: Barnett Abu, MD;  Location: [facility] OR;  Service: Neurosurgery;  Laterality: N/A;   LEFT HEART CATH AND CORONARY ANGIOGRAPHY N/A 10/07/2019   Procedure: LEFT HEART CATH AND CORONARY ANGIOGRAPHY;  Surgeon: Kathleene Hazel, MD;  Location: [facility] INVASIVE CV LAB;  Service: Cardiovascular;  Laterality: N/A;   LEFT HEART CATH AND CORONARY ANGIOGRAPHY N/A 06/20/2022   Procedure: LEFT HEART CATH AND CORONARY ANGIOGRAPHY;  Surgeon: Kathleene Hazel, MD;  Location: [facility]  INVASIVE CV LAB;  Service: Cardiovascular;  Laterality: N/A;   LUMBAR FUSION     SAVORY DILATION N/A 09/11/2018   Procedure: SAVORY DILATION;  Surgeon: Meridee Score Netty Starring., MD;  Location: [facility] ENDOSCOPY;  Service: Gastroenterology;  Laterality: N/A;   TONSILLECTOMY      Family History family history includes Aneurysm in her brother; Cancer in her brother; Coronary artery disease in her father, paternal aunt, and paternal grandmother; Dementia in her mother; Esophageal cancer in her brother; Heart attack in her father; Stomach cancer in her paternal aunt.  Social History Social History   Socioeconomic History  Marital status: Single    Spouse name: Not on file   Number of children: Not on file   Years of education: Not on file   Highest education level: Not on file  Occupational History   Occupation: disability  Tobacco Use   Smoking status: Every Day    Packs/day: .5    Types: Cigarettes   Smokeless tobacco: Never   Tobacco comments:    trying to quit   Vaping Use   Vaping Use: Never used  Substance and Sexual Activity   Alcohol use: Never    Comment: occasional   Drug use: Yes    Types: Marijuana    Comment: occas   Sexual activity: Not on file  Other Topics Concern   Not on file  Social History Narrative   Not on file   Social Determinants of Health   Financial Resource Strain: Low Risk  (06/17/2022)   Overall Financial Resource Strain (CARDIA)    Difficulty of Paying Living Expenses: Not hard at all  Food Insecurity: No Food Insecurity (07/15/2022)   Hunger Vital Sign    Worried About Running Out of Food in the Last Year: Never true    Ran Out of Food in the Last Year: Never true  Transportation Needs: No Transportation Needs (06/17/2022)   PRAPARE - Administrator, Civil Service (Medical): No    Lack of Transportation (Non-Medical): No  Physical Activity: Sufficiently Active (06/17/2022)   Exercise Vital Sign    Days of Exercise per Week: 7 days     Minutes of Exercise per Session: 30 min  Stress: No Stress Concern Present (06/17/2022)   [employer] of Occupational Health - Occupational Stress Questionnaire    Feeling of Stress : Not at all  Social Connections: Socially Integrated (06/17/2022)   Social Connection and Isolation Panel [NHANES]    Frequency of Communication with Friends and Family: More than three times a week    Frequency of Social Gatherings with Friends and Family: More than three times a week    Attends Religious Services: More than 4 times per year    Active Member of Clubs or Organizations: Yes    Attends Banker Meetings: More than 4 times per year    Marital Status: Living with partner  Intimate Partner Violence: Not At Risk (06/17/2022)   Humiliation, Afraid, Rape, and Kick questionnaire    Fear of Current or Ex-Partner: No    Emotionally Abused: No    Physically Abused: No    Sexually Abused: No    Laboratory Investigations Lab Results  Component Value Date   TSH 6.05 (H) 07/25/2022   TSH 1.53 02/13/2022   TSH 2.94 04/25/2021   FREET4 0.93 07/25/2022   FREET4 0.69 04/25/2021   FREET4 0.69 04/26/2020     Lab Results  Component Value Date   TSI 442 (H) 07/25/2022     No components found for: "TRAB"   Lab Results  Component Value Date   CHOL 120 02/13/2022   Lab Results  Component Value Date   HDL 43.50 02/13/2022   Lab Results  Component Value Date   LDLCALC 59 02/13/2022   Lab Results  Component Value Date   TRIG 88.0 02/13/2022   Lab Results  Component Value Date   CHOLHDL 3 02/13/2022   Lab Results  Component Value Date   CREATININE 1.25 (H) 06/10/2022   Lab Results  Component Value Date   GFR 63.72 02/13/2022  Component Value Date/Time   NA 142 06/10/2022 1559   K 4.5 06/10/2022 1559   CL 102 06/10/2022 1559   CO2 23 06/10/2022 1559   GLUCOSE 84 06/10/2022 1559   GLUCOSE 80 02/13/2022 1332   BUN 22 06/10/2022 1559   CREATININE 1.25 (H) 06/10/2022  1559   CALCIUM 9.3 06/10/2022 1559   PROT 7.3 02/13/2022 1332   PROT 6.7 10/08/2021 0000   ALBUMIN 4.2 02/13/2022 1332   ALBUMIN 4.3 10/08/2021 0000   AST 14 02/13/2022 1332   ALT 12 02/13/2022 1332   ALKPHOS 52 02/13/2022 1332   BILITOT 0.3 02/13/2022 1332   BILITOT <0.2 10/08/2021 0000   GFRNONAA >60 03/29/2021 1309   GFRAA 76 09/27/2019 1026      Latest Ref Rng & Units 06/10/2022    3:59 PM 02/13/2022    1:32 PM 10/08/2021   12:00 AM  BMP  Glucose 70 - 99 mg/dL 84  80  132   BUN 8 - 27 mg/dL 22  19  24    Creatinine 0.57 - 1.00 mg/dL 4.40  1.02  7.25   BUN/Creat Ratio 12 - 28 18   24    Sodium 134 - 144 mmol/L 142  138  140   Potassium 3.5 - 5.2 mmol/L 4.5  3.9  4.7   Chloride 96 - 106 mmol/L 102  100  103   CO2 20 - 29 mmol/L 23  29  27    Calcium 8.7 - 10.3 mg/dL 9.3  9.5  9.2        Component Value Date/Time   WBC 9.2 06/10/2022 1559   WBC 10.5 02/13/2022 1332   RBC 4.43 06/10/2022 1559   RBC 4.28 02/13/2022 1332   HGB 12.4 06/10/2022 1559   HCT 38.1 06/10/2022 1559   PLT 316 06/10/2022 1559   MCV 86 06/10/2022 1559   MCH 28.0 06/10/2022 1559   MCH 28.5 03/29/2021 1309   MCHC 32.5 06/10/2022 1559   MCHC 33.4 02/13/2022 1332   RDW 13.0 06/10/2022 1559   LYMPHSABS 2.6 02/13/2022 1332   LYMPHSABS 2.7 09/27/2019 1026   MONOABS 0.8 02/13/2022 1332   EOSABS 0.4 02/13/2022 1332   EOSABS 0.5 (H) 09/27/2019 1026   BASOSABS 0.1 02/13/2022 1332   BASOSABS 0.1 09/27/2019 1026      Parts of this note may have been dictated using voice recognition software. There may be variances in spelling and vocabulary which are unintentional. Not all errors are proofread. Please notify the Thereasa Parkin if any discrepancies are noted or if the meaning of any statement is not clear.

## 2022-08-07 ENCOUNTER — Telehealth: Payer: Self-pay | Admitting: *Deleted

## 2022-08-07 ENCOUNTER — Other Ambulatory Visit: Payer: Self-pay | Admitting: *Deleted

## 2022-08-07 DIAGNOSIS — E785 Hyperlipidemia, unspecified: Secondary | ICD-10-CM

## 2022-08-07 DIAGNOSIS — I2510 Atherosclerotic heart disease of native coronary artery without angina pectoris: Secondary | ICD-10-CM

## 2022-08-07 NOTE — Telephone Encounter (Signed)
S/w pt to schedule 6 month appt and fasting labs. Appt's made, labs ordered and linked.

## 2022-08-07 NOTE — Telephone Encounter (Signed)
-----   Message from Debbe Bales sent at 07/10/2022 11:21 AM EDT ----- Schedule 6 month f/u with [facility] or [facility] with labs one week prior cmet/lipid. Anytime. Lab orders in.

## 2022-08-22 DIAGNOSIS — H04123 Dry eye syndrome of bilateral lacrimal glands: Secondary | ICD-10-CM | POA: Diagnosis not present

## 2022-08-22 DIAGNOSIS — H2513 Age-related nuclear cataract, bilateral: Secondary | ICD-10-CM | POA: Diagnosis not present

## 2022-08-22 DIAGNOSIS — E0500 Thyrotoxicosis with diffuse goiter without thyrotoxic crisis or storm: Secondary | ICD-10-CM | POA: Diagnosis not present

## 2022-09-16 ENCOUNTER — Other Ambulatory Visit: Payer: Self-pay | Admitting: "Endocrinology

## 2022-09-16 DIAGNOSIS — E0500 Thyrotoxicosis with diffuse goiter without thyrotoxic crisis or storm: Secondary | ICD-10-CM

## 2022-09-18 ENCOUNTER — Other Ambulatory Visit: Payer: Self-pay | Admitting: Adult Health

## 2022-09-24 DIAGNOSIS — K0253 Dental caries on pit and fissure surface penetrating into pulp: Secondary | ICD-10-CM | POA: Diagnosis not present

## 2022-10-01 ENCOUNTER — Other Ambulatory Visit (INDEPENDENT_AMBULATORY_CARE_PROVIDER_SITE_OTHER): Payer: Medicare Other

## 2022-10-01 ENCOUNTER — Telehealth: Payer: Self-pay | Admitting: *Deleted

## 2022-10-01 ENCOUNTER — Telehealth: Payer: Self-pay

## 2022-10-01 DIAGNOSIS — E0500 Thyrotoxicosis with diffuse goiter without thyrotoxic crisis or storm: Secondary | ICD-10-CM

## 2022-10-01 LAB — T4, FREE: Free T4: 1.24 ng/dL (ref 0.60–1.60)

## 2022-10-01 LAB — TSH: TSH: 0.75 u[IU]/mL (ref 0.35–5.50)

## 2022-10-01 LAB — T3, FREE: T3, Free: 3.6 pg/mL (ref 2.3–4.2)

## 2022-10-01 NOTE — Telephone Encounter (Signed)
Pt scheduled for tele visit on 10/04/22 for preop clearance. Med rec and consent done

## 2022-10-01 NOTE — Telephone Encounter (Signed)
  Patient Consent for Virtual Visit         DANETT TEXTER has provided verbal consent on 10/01/2022 for a virtual visit (video or telephone).   CONSENT FOR VIRTUAL VISIT FOR:  [place] D Grall  By participating in this virtual visit I agree to the following:  I hereby voluntarily request, consent and authorize [facility] and its employed or contracted physicians, physician assistants, nurse practitioners or other licensed health care professionals (the Practitioner), to provide me with telemedicine health care services (the "Services") as deemed necessary by the treating Practitioner. I acknowledge and consent to receive the Services by the Practitioner via telemedicine. I understand that the telemedicine visit will involve communicating with the Practitioner through live audiovisual communication technology and the disclosure of certain medical information by electronic transmission. I acknowledge that I have been given the opportunity to request an in-person assessment or other available alternative prior to the telemedicine visit and am voluntarily participating in the telemedicine visit.  I understand that I have the right to withhold or withdraw my consent to the use of telemedicine in the course of my care at any time, without affecting my right to future care or treatment, and that the Practitioner or I may terminate the telemedicine visit at any time. I understand that I have the right to inspect all information obtained and/or recorded in the course of the telemedicine visit and may receive copies of available information for a reasonable fee.  I understand that some of the potential risks of receiving the Services via telemedicine include:  Delay or interruption in medical evaluation due to technological equipment failure or disruption; Information transmitted may not be sufficient (e.g. poor resolution of images) to allow for appropriate medical decision making by the  Practitioner; and/or  In rare instances, security protocols could fail, causing a breach of personal health information.  Furthermore, I acknowledge that it is my responsibility to provide information about my medical history, conditions and care that is complete and accurate to the best of my ability. I acknowledge that Practitioner's advice, recommendations, and/or decision may be based on factors not within their control, such as incomplete or inaccurate data provided by me or distortions of diagnostic images or specimens that may result from electronic transmissions. I understand that the practice of medicine is not an exact science and that Practitioner makes no warranties or guarantees regarding treatment outcomes. I acknowledge that a copy of this consent can be made available to me via my patient portal [facility] MyChart), or I can request a printed copy by calling the office of [facility].    I understand that my insurance will be billed for this visit.   I have read or had this consent read to me. I understand the contents of this consent, which adequately explains the benefits and risks of the Services being provided via telemedicine.  I have been provided ample opportunity to ask questions regarding this consent and the Services and have had my questions answered to my satisfaction. I give my informed consent for the services to be provided through the use of telemedicine in my medical care

## 2022-10-01 NOTE — Telephone Encounter (Signed)
   Name: Laurie [name]  DOB: 1955-08-05  MRN: [id number]  Primary Cardiologist: Verne Carrow, MD   Preoperative team, please contact this patient and set up a phone call appointment for further preoperative risk assessment. Please obtain consent and complete medication review. Thank you for your help.  I confirm that guidance regarding antiplatelet and oral anticoagulation therapy has been completed and, if necessary, noted below.  Her aspirin and Plavix may be held for 5 to 7 days prior to her procedure.  Please resume as soon as hemostasis is achieved.   Ronney Asters, NP 10/01/2022, 4:48 PM [facility]

## 2022-10-01 NOTE — Telephone Encounter (Signed)
   Pre-operative Risk Assessment    Patient Name: Laurie [name]  DOB: [birth year] MRN: [id number]    DATE OF LAST VISIT: 07/10/22 Eligha Bridegroom, NP DATE OF NEXT VISIT: 01/06/23 Eligha Bridegroom, NP Request for Surgical Clearance    Procedure:  Dental Extraction - Amount of Teeth to be Pulled:  17 TEETH FOR SURGICAL EXTRACTION  Date of Surgery:  Clearance 10/08/22                                 Surgeon:  DR. Alberteen Spindle, DDS Surgeon's Group or Practice Name:  [facility] Phone number:  [phone] Fax number:  [phone]   Type of Clearance Requested:   - Medical ; ASA AND PLAVIX    Type of Anesthesia:  Not Indicated (GENERAL?)   Additional requests/questions:    [name]   10/01/2022, 4:20 PM

## 2022-10-04 ENCOUNTER — Ambulatory Visit: Payer: Medicare Other | Attending: Interventional Cardiology

## 2022-10-04 NOTE — Progress Notes (Deleted)
Virtual Visit via Telephone Note   Because of Laurie [name]'s co-morbid illnesses, she is at least at moderate risk for complications without adequate follow up.  This format is felt to be most appropriate for this patient at this time.  The patient did not have access to video technology/had technical difficulties with video requiring transitioning to audio format only (telephone).  All issues noted in this document were discussed and addressed.  No physical exam could be performed with this format.  Please refer to the patient's chart for her consent to telehealth for [facility].  Evaluation Performed:  Preoperative cardiovascular risk assessment _____________   Date:  10/04/2022   Patient ID:  Laurie [name], DOB April 26, 1955, MRN [id number] Patient Location:  Home Provider location:   Office  Primary Care Provider:  Shirline Frees, NP Primary Cardiologist:  Verne Carrow, MD  Chief Complaint / Patient Profile   67 y.o. y/o female with a h/o CAD s/p PCI/DES times 03/2019, HTN, HLD, COPD, asthma, GERD, tobacco abuse, hypothyroidism who is pending dental extraction and presents today for telephonic preoperative cardiovascular risk assessment.  History of Present Illness    Laurie [name] is a 67 y.o. female who presents via Web designer for a telehealth visit today.  Pt was last seen in cardiology clinic on 07/10/2022 by Eligha Bridegroom, NP.  At that time [name] was doing well no new cardiac complaints.  She reported walking 30 minutes/day around her home.  The patient is now pending procedure as outlined above. Since her last visit, she ***  *** denies chest pain, shortness of breath, lower extremity edema, fatigue, palpitations, melena, hematuria, hemoptysis, diaphoresis, weakness, presyncope, syncope, orthopnea, and PND.   aspirin and Plavix may be held for 5 to 7 days prior to her procedure.   Past Medical History    Past Medical  History:  Diagnosis Date   Allergy    Anxiety    Arthritis    RA   Asthma    Chronic back pain    COPD (chronic obstructive pulmonary disease) (HCC)    Depression    Esophageal stricture    GERD (gastroesophageal reflux disease)    Heart murmur    Hematemesis 09/10/2018   Hyperlipidemia    Hypertension    IBS (irritable bowel syndrome)    Interstitial cystitis    Neuromuscular disorder (HCC)    fibromyalgia   PONV (postoperative nausea and vomiting)    Past Surgical History:  Procedure Laterality Date   ABDOMINAL HYSTERECTOMY     APPENDECTOMY     BIOPSY  09/11/2018   Procedure: BIOPSY;  Surgeon: Lemar Lofty., MD;  Location: [facility] ENDOSCOPY;  Service: Gastroenterology;;   CERVICAL FUSION     CERVICAL LAMINECTOMY     CORONARY STENT INTERVENTION N/A 10/07/2019   Procedure: CORONARY STENT INTERVENTION;  Surgeon: Kathleene Hazel, MD;  Location: [facility] INVASIVE [facility] LAB;  Service: Cardiovascular;  Laterality: N/A;   ESOPHAGOGASTRODUODENOSCOPY     ESOPHAGOGASTRODUODENOSCOPY (EGD) WITH PROPOFOL N/A 09/11/2018   Procedure: ESOPHAGOGASTRODUODENOSCOPY (EGD) WITH PROPOFOL;  Surgeon: Meridee Score Netty Starring., MD;  Location: [facility] ENDOSCOPY;  Service: Gastroenterology;  Laterality: N/A;   FINGER SURGERY     KYPHOPLASTY N/A 03/29/2021   Procedure: Lumbar Three KYPHOPLASTY;  Surgeon: Barnett Abu, MD;  Location: [facility] OR;  Service: Neurosurgery;  Laterality: N/A;   LEFT HEART CATH AND CORONARY ANGIOGRAPHY N/A 10/07/2019   Procedure: LEFT HEART CATH AND CORONARY ANGIOGRAPHY;  Surgeon: Kathleene Hazel, MD;  Location: [facility];  Service: Cardiovascular;  Laterality: N/A;   LEFT HEART CATH AND CORONARY ANGIOGRAPHY N/A 06/20/2022   Procedure: LEFT HEART CATH AND CORONARY ANGIOGRAPHY;  Surgeon: Kathleene Hazel, MD;  Location: [facility];  Service: Cardiovascular;  Laterality: N/A;   LUMBAR FUSION     SAVORY DILATION N/A 09/11/2018   Procedure: SAVORY DILATION;   Surgeon: Meridee Score Netty Starring., MD;  Location: [facility] ENDOSCOPY;  Service: Gastroenterology;  Laterality: N/A;   TONSILLECTOMY      Allergies  Allergies  Allergen Reactions   Codeine Nausea And Vomiting   Lisinopril Cough   Sulfonamide Derivatives Nausea And Vomiting   Tetracyclines & Related Rash    Home Medications    Prior to Admission medications   Medication Sig Start Date End Date Taking? Authorizing Provider  albuterol (VENTOLIN HFA) 108 (90 Base) MCG/ACT inhaler INHALE TWO PUFFS BY MOUTH INTO LUNGS every SIX hours AS NEEDED FOR WHEEZING AND/OR SHORTNESS OF BREATH 08/01/22   Nafziger, Kandee Keen, NP  aspirin EC 81 MG tablet Take 1 tablet (81 mg total) by mouth daily. Swallow whole. 09/27/19   Kathleene Hazel, MD  atorvastatin (LIPITOR) 40 MG tablet Take 1 tablet (40 mg total) by mouth every evening. 10/10/21   Kathleene Hazel, MD  Blood Pressure Monitoring DEVI Use as needed DX I10 04/10/20   Burchette, Elberta Fortis, MD  clopidogrel (PLAVIX) 75 MG tablet Take 1 tablet (75 mg total) by mouth daily. 10/10/21   Kathleene Hazel, MD  fenofibrate (TRICOR) 145 MG tablet Take 1 tablet (145 mg total) by mouth daily. 10/09/21 10/10/22  Swinyer, Zachary George, NP  fluticasone Aleda Grana) 50 MCG/ACT nasal spray instill two SPRAYS in each nostril daily 02/07/22   Nafziger, Kandee Keen, NP  furosemide (LASIX) 20 MG tablet TAKE ONE TABLET BY MOUTH ONCE daily AS NEEDED FOR LEG EDEMA 05/08/22   Nafziger, Kandee Keen, NP  lidocaine (XYLOCAINE) 2 % solution Apply 5 ml  around gum of affected teeth up to 4 times per day. 10/09/21   [place], Betty G, MD  losartan (COZAAR) 50 MG tablet TAKE ONE TABLET BY MOUTH EVERY MORNING Patient taking differently: Take 50 mg by mouth daily. 06/17/22   Nafziger, Kandee Keen, NP  methimazole (TAPAZOLE) 10 MG tablet Take 1 tablet (10 mg total) by mouth every evening. 09/16/22   Altamese [place], MD  metoprolol succinate (TOPROL-XL) 50 MG 24 hr tablet Take 1 tablet (50 mg total) by mouth daily.  Take with or immediately following a meal. 04/17/22   Nafziger, Kandee Keen, NP  nitroGLYCERIN (NITROSTAT) 0.4 MG SL tablet Place 1 tablet (0.4 mg total) under the tongue every 5 (five) minutes as needed for chest pain. 10/19/19 08/05/22  Furth, Cadence H, PA-C  ondansetron (ZOFRAN-ODT) 8 MG disintegrating tablet TAKE ONE TABLET BY MOUTH every EIGHT hours AS NEEDED FOR NAUSEA AND VOMITING 09/18/22   Nafziger, Kandee Keen, NP  pantoprazole (PROTONIX) 40 MG tablet TAKE ONE TABLET BY MOUTH EVERY MORNING Patient taking differently: Take 40 mg by mouth daily. 02/07/22   Nafziger, Kandee Keen, NP  venlafaxine XR (EFFEXOR-XR) 150 MG 24 hr capsule TAKE ONE CAPSULE BY MOUTH EVERY MORNING Patient taking differently: Take 150 mg by mouth daily with breakfast. 06/17/22   Shirline Frees, NP    Physical Exam    Vital Signs:  Laurie D Knebel does not have vital signs available for review today.***  Given telephonic nature of communication, physical exam is limited. AAOx3. NAD. Normal affect.  Speech and respirations are unlabored.  Accessory Clinical Findings    None  Assessment & Plan    1.  Preoperative Cardiovascular Risk Assessment: -Patient's RCRI score is 0.9%  The patient was advised that if she develops new symptoms prior to surgery to contact our office to arrange for a follow-up visit, and she verbalized understanding.  (Reminder: Include SBE prophylaxis/Antiplatelet/Anticoag Instructions***)  A copy of this note will be routed to requesting surgeon.  Time:   Today, I have spent *** minutes with the patient with telehealth technology discussing medical history, symptoms, and management plan.     Napoleon Form, Leodis Rains, NP  10/04/2022, 7:39 AM

## 2022-10-08 ENCOUNTER — Other Ambulatory Visit: Payer: Self-pay | Admitting: Adult Health

## 2022-10-11 ENCOUNTER — Other Ambulatory Visit: Payer: Self-pay | Admitting: Adult Health

## 2022-10-30 ENCOUNTER — Other Ambulatory Visit: Payer: Self-pay | Admitting: Adult Health

## 2022-11-04 ENCOUNTER — Other Ambulatory Visit: Payer: Self-pay | Admitting: Adult Health

## 2022-11-04 ENCOUNTER — Ambulatory Visit: Payer: Medicare Other | Admitting: "Endocrinology

## 2022-11-04 DIAGNOSIS — K0889 Other specified disorders of teeth and supporting structures: Secondary | ICD-10-CM

## 2022-11-05 ENCOUNTER — Encounter: Payer: Self-pay | Admitting: "Endocrinology

## 2022-11-05 ENCOUNTER — Ambulatory Visit (INDEPENDENT_AMBULATORY_CARE_PROVIDER_SITE_OTHER): Payer: Medicare Other | Admitting: "Endocrinology

## 2022-11-05 VITALS — BP 115/63 | HR 63 | Ht 70.0 in | Wt 169.4 lb

## 2022-11-05 DIAGNOSIS — E0500 Thyrotoxicosis with diffuse goiter without thyrotoxic crisis or storm: Secondary | ICD-10-CM

## 2022-11-05 DIAGNOSIS — F17200 Nicotine dependence, unspecified, uncomplicated: Secondary | ICD-10-CM

## 2022-11-05 DIAGNOSIS — E041 Nontoxic single thyroid nodule: Secondary | ICD-10-CM

## 2022-11-05 NOTE — Progress Notes (Signed)
Outpatient Endocrinology Note Laurie [place], MD  11/05/22   Laurie [name] [birth year] [id number]  Referring Provider: Shirline Frees, NP Primary Care Provider: Shirline Frees, NP Subjective  No chief complaint on file.   Assessment & Plan  Diagnoses and all orders for this visit:  Graves disease -     TSH; Future -     T3, free; Future -     T4, free; Future -     [place] FNA BX THYROID 1ST LESION AFIRMA; Future -     [place] FNA BX THYROID 1ST LESION AFIRMA  Smoking  Uninodular goiter -     [place] FNA BX THYROID 1ST LESION AFIRMA; Future -     [place] FNA BX THYROID 1ST LESION AFIRMA    Laurie [name] is currently taking methimazole 20 mg qd. Educated on thyroid axis.  Discussed the etiology for hyperthyroidism. Educated on thyroid axis.  Recommend the following: Pt increased from 10 mg to 20 mg this week by self. Repeat labs today.  Repeat labs  sooner if symptoms of hyper or hypothyroidism develop.  Pt previously decided on medication rather than RAI therapy and surgery.  Counseled on: -complications of untreated hyperthyroidism including atrial fibrillation, heart failure and osteoporosis -side effects of Methimazole including but not limited to allergic reaction, rash, bone marrow suppression, liver dysfunction -compliance and follow up needs    If you notice any symptoms of worsening fatigue, fever with sore throat, loss of appetite, yellowing of eyes, dark urine, joint pains, sores in the mouth, itchy rash, light colored stools or abdominal pain, please stop the medication and call us immediately as this can be a serious side effect of the medication.  The substances in cigarettes can affect the autoimmune processes underlying Graves' disease Encouraged quitting and contacting PCP for any help needed such as patches or pills to curb nicotine dependence   Pt understands and agreed  Ordered eye referral to assess eyes (watering, pain, burning, sand/gritty sensation  and swelling under eyes), has appointment in July 2024  Ordered thyroid ultrasound to assess for thyromegaly report in CT angio neck done in 2023. Pt open to FNA if needed. Thyroid ultrasound revealed left love thyromegaly due to TR3 inferior left thyroid lobe measuring 4.9 x 4.6 x 3.0 cm. Discussed, patient wants to proceed with FNA, ordered FNA. Did not do. Ordered again.   I have reviewed current medications, nurse's notes, allergies, vital signs, past medical and surgical history, family medical history, and social history for this encounter. Counseled patient on symptoms, examination findings, lab findings, imaging results, treatment decisions and monitoring and prognosis. The patient understood the recommendations and agrees with the treatment plan. All questions regarding treatment plan were fully answered.   Return in about 3 months (around 02/04/2023) for visit, labs today.   Laurie [place], MD  11/05/22   I have reviewed current medications, nurse's notes, allergies, vital signs, past medical and surgical history, family medical history, and social history for this encounter. Counseled patient on symptoms, examination findings, lab findings, imaging results, treatment decisions and monitoring and prognosis. The patient understood the recommendations and agrees with the treatment plan. All questions regarding treatment plan were fully answered.   History of Present Illness Laurie [name] is a 67 y.o. year old female who presents to our clinic with hyperthyroidism diagnosed in 2021.     Symptoms suggestive of HYPERTHYROIDISM:  weight loss  No heat intolerance No hyperdefecation  No palpitations  No  Compressive  symptoms:  dysphagia  Yes-history of esophageal dilation thrice dysphonia  Yes positional dyspnea (especially with simultaneous arms elevation)  No  Smokes  Yes On biotin  No Personal history of head/neck surgery/irradiation-neck surgery for ruptured disc  Adverse  Drug Effects from Methimazole (MMI): none  Grave's Ophthalmopathy Clinical Activity Score: 2/9, feels burning and gritty sensation in eyes  03/2021 CT ANGIOGRAPHY NECK  Chronically enlarged and heterogeneous thyroid (series 7, image 91).  07/26/22 Thyroid ultrasound reviewed images and report independently  Left lobe: 7.3 x 3.1 x 2.6 cm  Nodule labeled 1 is a large solid isoechoic TR 3 nodule occupying a majority of and expanding the inferior left thyroid lobe measuring 4.9 x 4.6 x 3.0 cm. **Given size (>/= 2.5 cm) and appearance, fine needle aspiration of this mildly suspicious nodule should be considered based on TI-RADS criteria.  Physical Exam  There were no vitals taken for this visit. Constitutional: well developed, well nourished Head: normocephalic, atraumatic, no exophthalmos Eyes: sclera anicteric, no redness Neck: + thyromegaly, no thyroid tenderness; no nodules palpated Lungs: normal respiratory effort Neurology: alert and oriented Skin: dry, no appreciable rashes Musculoskeletal: no appreciable defects Psychiatric: normal mood and affect  Allergies Allergies  Allergen Reactions   Codeine Nausea And Vomiting   Lisinopril Cough   Sulfonamide Derivatives Nausea And Vomiting   Tetracyclines & Related Rash    Current Medications Patient's Medications  New Prescriptions   No medications on file  Previous Medications   ALBUTEROL (VENTOLIN HFA) 108 (90 BASE) MCG/ACT INHALER    INHALE 2 PUFFS BY MOUTH EVERY 6 HOURS AS NEEDED FOR SHORTNESS OF BREATH AND WHEEZING   ASPIRIN EC 81 MG TABLET    Take 1 tablet (81 mg total) by mouth daily. Swallow whole.   ATORVASTATIN (LIPITOR) 40 MG TABLET    Take 1 tablet (40 mg total) by mouth every evening.   BLOOD PRESSURE MONITORING DEVI    Use as needed DX I10   CLOPIDOGREL (PLAVIX) 75 MG TABLET    Take 1 tablet (75 mg total) by mouth daily.   FENOFIBRATE (TRICOR) 145 MG TABLET    Take 1 tablet (145 mg total) by mouth daily.    FLUTICASONE (FLONASE) 50 MCG/ACT NASAL SPRAY    INSTILL TWO (2) SPRAYS IN EACH NOSTRIL DAILY   FUROSEMIDE (LASIX) 20 MG TABLET    TAKE ONE TABLET BY MOUTH ONCE daily AS NEEDED FOR LEG EDEMA   LIDOCAINE (XYLOCAINE) 2 % SOLUTION    Apply 5 ml  around gum of affected teeth up to 4 times per day.   LOSARTAN (COZAAR) 50 MG TABLET    TAKE ONE TABLET BY MOUTH EVERY MORNING   METHIMAZOLE (TAPAZOLE) 10 MG TABLET    Take 1 tablet (10 mg total) by mouth every evening.   METOPROLOL SUCCINATE (TOPROL-XL) 50 MG 24 HR TABLET    TAKE 1 TABLET BY MOUTH EVERY MORNING WITH OR IMMEDIATELY FOLLOWING A MEAL   NITROGLYCERIN (NITROSTAT) 0.4 MG SL TABLET    Place 1 tablet (0.4 mg total) under the tongue every 5 (five) minutes as needed for chest pain.   ONDANSETRON (ZOFRAN-ODT) 8 MG DISINTEGRATING TABLET    TAKE ONE TABLET BY MOUTH every EIGHT hours AS NEEDED FOR NAUSEA AND VOMITING   PANTOPRAZOLE (PROTONIX) 40 MG TABLET    TAKE ONE TABLET BY MOUTH EVERY MORNING   VENLAFAXINE XR (EFFEXOR-XR) 150 MG 24 HR CAPSULE    TAKE ONE CAPSULE BY MOUTH EVERY MORNING  Modified Medications  No medications on file  Discontinued Medications   No medications on file    Past Medical History Past Medical History:  Diagnosis Date   Allergy    Anxiety    Arthritis    RA   Asthma    Chronic back pain    COPD (chronic obstructive pulmonary disease) (HCC)    Depression    Esophageal stricture    GERD (gastroesophageal reflux disease)    Heart murmur    Hematemesis 09/10/2018   Hyperlipidemia    Hypertension    IBS (irritable bowel syndrome)    Interstitial cystitis    Neuromuscular disorder (HCC)    fibromyalgia   PONV (postoperative nausea and vomiting)     Past Surgical History Past Surgical History:  Procedure Laterality Date   ABDOMINAL HYSTERECTOMY     APPENDECTOMY     BIOPSY  09/11/2018   Procedure: BIOPSY;  Surgeon: Lemar Lofty., MD;  Location: [facility] ENDOSCOPY;  Service: Gastroenterology;;   CERVICAL  FUSION     CERVICAL LAMINECTOMY     CORONARY STENT INTERVENTION N/A 10/07/2019   Procedure: CORONARY STENT INTERVENTION;  Surgeon: Kathleene Hazel, MD;  Location: [facility] INVASIVE CV LAB;  Service: Cardiovascular;  Laterality: N/A;   ESOPHAGOGASTRODUODENOSCOPY     ESOPHAGOGASTRODUODENOSCOPY (EGD) WITH PROPOFOL N/A 09/11/2018   Procedure: ESOPHAGOGASTRODUODENOSCOPY (EGD) WITH PROPOFOL;  Surgeon: Meridee Score Netty Starring., MD;  Location: [facility] ENDOSCOPY;  Service: Gastroenterology;  Laterality: N/A;   FINGER SURGERY     KYPHOPLASTY N/A 03/29/2021   Procedure: Lumbar Three KYPHOPLASTY;  Surgeon: Barnett Abu, MD;  Location: [facility] OR;  Service: Neurosurgery;  Laterality: N/A;   LEFT HEART CATH AND CORONARY ANGIOGRAPHY N/A 10/07/2019   Procedure: LEFT HEART CATH AND CORONARY ANGIOGRAPHY;  Surgeon: Kathleene Hazel, MD;  Location: [facility]VAS[facility] CV LAB;  Service: Cardiovascular;  Laterality: N/A;   LEFT HEART CATH AND CORONARY ANGIOGRAPHY N/A 06/20/2022   Procedure: LEFT HEART CATH AND CORONARY ANGIOGRAPHY;  Surgeon: Kathleene Hazel, MD;  Location: [facility] INVASIVE CV LAB;  Service: Cardiovascular;  Laterality: N/A;   LUMBAR FUSION     SAVORY DILATION N/A 09/11/2018   Procedure: SAVORY DILATION;  Surgeon: Meridee Score Netty Starring., MD;  Location: [facility] ENDOSCOPY;  Service: Gastroenterology;  Laterality: N/A;   TONSILLECTOMY      Family History family history includes Aneurysm in her brother; Cancer in her brother; Coronary artery disease in her father, paternal aunt, and paternal grandmother; Dementia in her mother; Esophageal cancer in her brother; Heart attack in her father; Stomach cancer in her paternal aunt.  Social History Social History   Socioeconomic History   Marital status: Single    Spouse name: Not on file   Number of children: Not on file   Years of education: Not on file   Highest education level: Not on file  Occupational History   Occupation: disability  Tobacco Use   Smoking  status: Every Day    Current packs/day: 0.50    Types: Cigarettes   Smokeless tobacco: Never   Tobacco comments:    trying to quit   Vaping Use   Vaping status: Never Used  Substance and Sexual Activity   Alcohol use: Never    Comment: occasional   Drug use: Yes    Types: Marijuana    Comment: occas   Sexual activity: Not on file  Other Topics Concern   Not on file  Social History Narrative   Not on file   Social Determinants of Health  Financial Resource Strain: Low Risk  (06/17/2022)   Overall Financial Resource Strain (CARDIA)    Difficulty of Paying Living Expenses: Not hard at all  Food Insecurity: No Food Insecurity (07/15/2022)   Hunger Vital Sign    Worried About Running Out of Food in the Last Year: Never true    Ran Out of Food in the Last Year: Never true  Transportation Needs: No Transportation Needs (06/17/2022)   PRAPARE - Administrator, Civil Service (Medical): No    Lack of Transportation (Non-Medical): No  Physical Activity: Sufficiently Active (06/17/2022)   Exercise Vital Sign    Days of Exercise per Week: 7 days    Minutes of Exercise per Session: 30 min  Stress: No Stress Concern Present (06/17/2022)   [employer] of Occupational Health - Occupational Stress Questionnaire    Feeling of Stress : Not at all  Social Connections: Socially Integrated (06/17/2022)   Social Connection and Isolation Panel [NHANES]    Frequency of Communication with Friends and Family: More than three times a week    Frequency of Social Gatherings with Friends and Family: More than three times a week    Attends Religious Services: More than 4 times per year    Active Member of Clubs or Organizations: Yes    Attends Banker Meetings: More than 4 times per year    Marital Status: Living with partner  Intimate Partner Violence: Not At Risk (06/17/2022)   Humiliation, Afraid, Rape, and Kick questionnaire    Fear of Current or Ex-Partner: No    Emotionally  Abused: No    Physically Abused: No    Sexually Abused: No    Laboratory Investigations Lab Results  Component Value Date   TSH 0.75 10/01/2022   TSH 6.05 (H) 07/25/2022   TSH 1.53 02/13/2022   FREET4 1.24 10/01/2022   FREET4 0.93 07/25/2022   FREET4 0.69 04/25/2021     Lab Results  Component Value Date   TSI 442 (H) 07/25/2022     No components found for: "TRAB"   Lab Results  Component Value Date   CHOL 120 02/13/2022   Lab Results  Component Value Date   HDL 43.50 02/13/2022   Lab Results  Component Value Date   LDLCALC 59 02/13/2022   Lab Results  Component Value Date   TRIG 88.0 02/13/2022   Lab Results  Component Value Date   CHOLHDL 3 02/13/2022   Lab Results  Component Value Date   CREATININE 1.25 (H) 06/10/2022   Lab Results  Component Value Date   GFR 63.72 02/13/2022      Component Value Date/Time   NA 142 06/10/2022 1559   K 4.5 06/10/2022 1559   CL 102 06/10/2022 1559   CO2 23 06/10/2022 1559   GLUCOSE 84 06/10/2022 1559   GLUCOSE 80 02/13/2022 1332   BUN 22 06/10/2022 1559   CREATININE 1.25 (H) 06/10/2022 1559   CALCIUM 9.3 06/10/2022 1559   PROT 7.3 02/13/2022 1332   PROT 6.7 10/08/2021 0000   ALBUMIN 4.2 02/13/2022 1332   ALBUMIN 4.3 10/08/2021 0000   AST 14 02/13/2022 1332   ALT 12 02/13/2022 1332   ALKPHOS 52 02/13/2022 1332   BILITOT 0.3 02/13/2022 1332   BILITOT <0.2 10/08/2021 0000   GFRNONAA >60 03/29/2021 1309   GFRAA 76 09/27/2019 1026      Latest Ref Rng & Units 06/10/2022    3:59 PM 02/13/2022    1:32 PM 10/08/2021  12:00 AM  BMP  Glucose 70 - 99 mg/dL 84  80  161   BUN 8 - 27 mg/dL 22  19  24    Creatinine 0.57 - 1.00 mg/dL 0.96  0.45  4.09   BUN/Creat Ratio 12 - 28 18   24    Sodium 134 - 144 mmol/L 142  138  140   Potassium 3.5 - 5.2 mmol/L 4.5  3.9  4.7   Chloride 96 - 106 mmol/L 102  100  103   CO2 20 - 29 mmol/L 23  29  27    Calcium 8.7 - 10.3 mg/dL 9.3  9.5  9.2        Component Value Date/Time   WBC  9.2 06/10/2022 1559   WBC 10.5 02/13/2022 1332   RBC 4.43 06/10/2022 1559   RBC 4.28 02/13/2022 1332   HGB 12.4 06/10/2022 1559   HCT 38.1 06/10/2022 1559   PLT 316 06/10/2022 1559   MCV 86 06/10/2022 1559   MCH 28.0 06/10/2022 1559   MCH 28.5 03/29/2021 1309   MCHC 32.5 06/10/2022 1559   MCHC 33.4 02/13/2022 1332   RDW 13.0 06/10/2022 1559   LYMPHSABS 2.6 02/13/2022 1332   LYMPHSABS 2.7 09/27/2019 1026   MONOABS 0.8 02/13/2022 1332   EOSABS 0.4 02/13/2022 1332   EOSABS 0.5 (H) 09/27/2019 1026   BASOSABS 0.1 02/13/2022 1332   BASOSABS 0.1 09/27/2019 1026      Parts of this note may have been dictated using voice recognition software. There may be variances in spelling and vocabulary which are unintentional. Not all errors are proofread. Please notify the Thereasa Parkin if any discrepancies are noted or if the meaning of any statement is not clear.

## 2022-11-06 LAB — T3, FREE: T3, Free: 3 pg/mL (ref 2.3–4.2)

## 2022-11-06 LAB — T4, FREE: Free T4: 1.05 ng/dL (ref 0.60–1.60)

## 2022-11-06 LAB — TSH: TSH: 1.86 u[IU]/mL (ref 0.35–5.50)

## 2022-11-07 ENCOUNTER — Other Ambulatory Visit: Payer: Self-pay | Admitting: "Endocrinology

## 2022-11-07 DIAGNOSIS — E0500 Thyrotoxicosis with diffuse goiter without thyrotoxic crisis or storm: Secondary | ICD-10-CM

## 2022-11-19 ENCOUNTER — Ambulatory Visit (INDEPENDENT_AMBULATORY_CARE_PROVIDER_SITE_OTHER): Payer: Medicare Other | Admitting: Adult Health

## 2022-11-19 VITALS — BP 170/80 | HR 66 | Temp 98.3°F | Ht 70.0 in | Wt 167.0 lb

## 2022-11-19 DIAGNOSIS — I10 Essential (primary) hypertension: Secondary | ICD-10-CM

## 2022-11-19 DIAGNOSIS — J0140 Acute pansinusitis, unspecified: Secondary | ICD-10-CM | POA: Diagnosis not present

## 2022-11-19 DIAGNOSIS — M6740 Ganglion, unspecified site: Secondary | ICD-10-CM | POA: Diagnosis not present

## 2022-11-19 DIAGNOSIS — D1721 Benign lipomatous neoplasm of skin and subcutaneous tissue of right arm: Secondary | ICD-10-CM

## 2022-11-19 MED ORDER — AMOXICILLIN-POT CLAVULANATE 875-125 MG PO TABS
1.0000 | ORAL_TABLET | Freq: Two times a day (BID) | ORAL | 0 refills | Status: DC
Start: 2022-11-19 — End: 2022-12-03

## 2022-11-19 MED ORDER — LOSARTAN POTASSIUM 100 MG PO TABS
100.0000 mg | ORAL_TABLET | Freq: Every day | ORAL | 1 refills | Status: DC
Start: 2022-11-19 — End: 2023-01-30

## 2022-11-19 NOTE — Progress Notes (Signed)
Subjective:    Patient ID: Laurie [name], female    DOB: 1955-09-11, 67 y.o.   MRN: [id number]  HPI 67 year old female who  has a past medical history of Allergy, Anxiety, Arthritis, Asthma, Chronic back pain, COPD (chronic obstructive pulmonary disease) (HCC), Depression, Esophageal stricture, GERD (gastroesophageal reflux disease), Heart murmur, Hematemesis (09/10/2018), Hyperlipidemia, Hypertension, IBS (irritable bowel syndrome), Interstitial cystitis, Neuromuscular disorder (HCC), and PONV (postoperative nausea and vomiting).  She presents to the office today for multiple issues.   HTN -he is currently maintained with losartan 50 mg daily and metoprolol 50 mg every morning ( she has not taken her BP meds this  morning).  He reports at home she has been getting blood pressure readings in the 170s to 200s systolic.  She denies chest pain or vision changes. She has had headaches  BP Readings from Last 3 Encounters:  11/19/22 (!) 170/80  11/05/22 115/63  07/18/22 (!) 160/80   Other more for the last couple weeks she has noticed nasal congestion with yellow mucus and blood out of her right nare, also has sinus pain and pressure under her right eye as well as headaches.  She was seen by her dentist recently and advised to follow-up with her PCP if she had a sinus infection  Furthermore she reports a mass at the base of her right thumb and a mass at the base of her left thumb.  The mass right is larger.  She has pain with gripping and loss of grip strength.  Review of Systems See HPI   Past Medical History:  Diagnosis Date   Allergy    Anxiety    Arthritis    RA   Asthma    Chronic back pain    COPD (chronic obstructive pulmonary disease) (HCC)    Depression    Esophageal stricture    GERD (gastroesophageal reflux disease)    Heart murmur    Hematemesis 09/10/2018   Hyperlipidemia    Hypertension    IBS (irritable bowel syndrome)    Interstitial cystitis    Neuromuscular  disorder (HCC)    fibromyalgia   PONV (postoperative nausea and vomiting)     Social History   Socioeconomic History   Marital status: Single    Spouse name: Not on file   Number of children: Not on file   Years of education: Not on file   Highest education level: Not on file  Occupational History   Occupation: disability  Tobacco Use   Smoking status: Every Day    Current packs/day: 0.50    Types: Cigarettes   Smokeless tobacco: Never   Tobacco comments:    trying to quit   Vaping Use   Vaping status: Never Used  Substance and Sexual Activity   Alcohol use: Never    Comment: occasional   Drug use: Yes    Types: Marijuana    Comment: occas   Sexual activity: Not on file  Other Topics Concern   Not on file  Social History Narrative   Not on file   Social Determinants of Health   Financial Resource Strain: Low Risk  (06/17/2022)   Overall Financial Resource Strain (CARDIA)    Difficulty of Paying Living Expenses: Not hard at all  Food Insecurity: No Food Insecurity (07/15/2022)   Hunger Vital Sign    Worried About Running Out of Food in the Last Year: Never true    Ran Out of Food in the Last Year: Never  true  Transportation Needs: No Transportation Needs (06/17/2022)   PRAPARE - Administrator, Civil Service (Medical): No    Lack of Transportation (Non-Medical): No  Physical Activity: Sufficiently Active (06/17/2022)   Exercise Vital Sign    Days of Exercise per Week: 7 days    Minutes of Exercise per Session: 30 min  Stress: No Stress Concern Present (06/17/2022)   [employer] of Occupational Health - Occupational Stress Questionnaire    Feeling of Stress : Not at all  Social Connections: Socially Integrated (06/17/2022)   Social Connection and Isolation Panel [NHANES]    Frequency of Communication with Friends and Family: More than three times a week    Frequency of Social Gatherings with Friends and Family: More than three times a week    Attends  Religious Services: More than 4 times per year    Active Member of Clubs or Organizations: Yes    Attends Banker Meetings: More than 4 times per year    Marital Status: Living with partner  Intimate Partner Violence: Not At Risk (06/17/2022)   Humiliation, Afraid, Rape, and Kick questionnaire    Fear of Current or Ex-Partner: No    Emotionally Abused: No    Physically Abused: No    Sexually Abused: No    Past Surgical History:  Procedure Laterality Date   ABDOMINAL HYSTERECTOMY     APPENDECTOMY     BIOPSY  09/11/2018   Procedure: BIOPSY;  Surgeon: Meridee Score, Netty Starring., MD;  Location: [facility];  Service: Gastroenterology;;   CERVICAL FUSION     CERVICAL LAMINECTOMY     CORONARY STENT INTERVENTION N/A 10/07/2019   Procedure: CORONARY STENT INTERVENTION;  Surgeon: Kathleene Hazel, MD;  Location: [facility];  Service: Cardiovascular;  Laterality: N/A;   ESOPHAGOGASTRODUODENOSCOPY     ESOPHAGOGASTRODUODENOSCOPY (EGD) WITH PROPOFOL N/A 09/11/2018   Procedure: ESOPHAGOGASTRODUODENOSCOPY (EGD) WITH PROPOFOL;  Surgeon: Meridee Score Netty Starring., MD;  Location: [facility] ENDOSCOPY;  Service: Gastroenterology;  Laterality: N/A;   FINGER SURGERY     KYPHOPLASTY N/A 03/29/2021   Procedure: Lumbar Three KYPHOPLASTY;  Surgeon: Barnett Abu, MD;  Location: [facility] OR;  Service: Neurosurgery;  Laterality: N/A;   LEFT HEART CATH AND CORONARY ANGIOGRAPHY N/A 10/07/2019   Procedure: LEFT HEART CATH AND CORONARY ANGIOGRAPHY;  Surgeon: Kathleene Hazel, MD;  Location: [facility];  Service: Cardiovascular;  Laterality: N/A;   LEFT HEART CATH AND CORONARY ANGIOGRAPHY N/A 06/20/2022   Procedure: LEFT HEART CATH AND CORONARY ANGIOGRAPHY;  Surgeon: Kathleene Hazel, MD;  Location: [facility];  Service: Cardiovascular;  Laterality: N/A;   LUMBAR FUSION     SAVORY DILATION N/A 09/11/2018   Procedure: SAVORY DILATION;  Surgeon: Meridee Score Netty Starring., MD;  Location:  [facility] ENDOSCOPY;  Service: Gastroenterology;  Laterality: N/A;   TONSILLECTOMY      Family History  Problem Relation Age of Onset   Dementia Mother    Heart attack Father    Coronary artery disease Father    Cancer Brother        esophageal   Esophageal cancer Brother    Coronary artery disease Paternal Aunt    Stomach cancer Paternal Aunt    Coronary artery disease Paternal Grandmother    Aneurysm Brother        aortic   Rectal cancer Neg Hx    Colon cancer Neg Hx    Thyroid disease Neg Hx     Allergies  Allergen Reactions  Codeine Nausea And Vomiting   Lisinopril Cough   Sulfonamide Derivatives Nausea And Vomiting   Tetracyclines & Related Rash    Current Outpatient Medications on File Prior to Visit  Medication Sig Dispense Refill   albuterol (VENTOLIN HFA) 108 (90 Base) MCG/ACT inhaler INHALE 2 PUFFS BY MOUTH EVERY 6 HOURS AS NEEDED FOR SHORTNESS OF BREATH AND WHEEZING 8.5 g 10   aspirin EC 81 MG tablet Take 1 tablet (81 mg total) by mouth daily. Swallow whole. 90 tablet 3   atorvastatin (LIPITOR) 40 MG tablet Take 1 tablet (40 mg total) by mouth every evening. 90 tablet 3   Blood Pressure Monitoring DEVI Use as needed DX I10 1 each 1   clopidogrel (PLAVIX) 75 MG tablet Take 1 tablet (75 mg total) by mouth daily. 90 tablet 3   fenofibrate (TRICOR) 145 MG tablet Take 1 tablet (145 mg total) by mouth daily. 90 tablet 3   fluticasone (FLONASE) 50 MCG/ACT nasal spray INSTILL TWO (2) SPRAYS IN EACH NOSTRIL DAILY 16 g 10   furosemide (LASIX) 20 MG tablet TAKE ONE TABLET BY MOUTH ONCE daily AS NEEDED FOR LEG EDEMA 30 tablet 3   lidocaine (XYLOCAINE) 2 % solution Apply 5 ml  around gum of affected teeth up to 4 times per day. 100 mL 0   methimazole (TAPAZOLE) 10 MG tablet Take 1 tablet (10 mg total) by mouth every evening. 30 tablet 1   metoprolol succinate (TOPROL-XL) 50 MG 24 hr tablet TAKE 1 TABLET BY MOUTH EVERY MORNING WITH OR IMMEDIATELY FOLLOWING A MEAL 30 tablet 10    ondansetron (ZOFRAN-ODT) 8 MG disintegrating tablet TAKE ONE TABLET BY MOUTH every EIGHT hours AS NEEDED FOR NAUSEA AND VOMITING 20 tablet 3   pantoprazole (PROTONIX) 40 MG tablet TAKE ONE TABLET BY MOUTH EVERY MORNING (Patient taking differently: Take 40 mg by mouth daily.) 90 tablet 3   venlafaxine XR (EFFEXOR-XR) 150 MG 24 hr capsule TAKE ONE CAPSULE BY MOUTH EVERY MORNING (Patient taking differently: Take 150 mg by mouth daily with breakfast.) 90 capsule 1   nitroGLYCERIN (NITROSTAT) 0.4 MG SL tablet Place 1 tablet (0.4 mg total) under the tongue every 5 (five) minutes as needed for chest pain. 25 tablet 1   No current facility-administered medications on file prior to visit.    BP (!) 170/80   Pulse 66   Temp 98.3 F (36.8 C) (Oral)   Ht 5\' 10"  (1.778 m)   Wt 167 lb (75.8 kg)   SpO2 97%   BMI 23.96 kg/m       Objective:   Physical Exam Vitals and nursing note reviewed.  Constitutional:      Appearance: Normal appearance.  HENT:     Nose: Congestion and rhinorrhea present. Rhinorrhea is purulent.     Right Turbinates: Enlarged and swollen.     Left Turbinates: Not enlarged or swollen.     Right Sinus: Maxillary sinus tenderness and frontal sinus tenderness present.     Left Sinus: No maxillary sinus tenderness or frontal sinus tenderness.  Cardiovascular:     Rate and Rhythm: Normal rate and regular rhythm.     Heart sounds: Normal heart sounds.  Pulmonary:     Effort: Pulmonary effort is normal.     Breath sounds: Normal breath sounds.  Musculoskeletal:        General: Normal range of motion.     Comments: She has a 2 cm lipoma on the palmar surface, below her right thumb and she  also has a 1 m cystic structure on the palmar surface below her left thumb.  Skin:    General: Skin is dry.  Neurological:     General: No focal deficit present.     Mental Status: She is oriented to person, place, and time.     Cranial Nerves: Cranial nerves 2-12 are intact.     Motor:  Motor function is intact. No weakness.     Comments: 5/5 grip strength bilaterally but did have pain at the sites of the masses with gripping.   Psychiatric:        Mood and Affect: Mood normal.        Behavior: Behavior normal.        Thought Content: Thought content normal.        Judgment: Judgment normal.       Assessment & Plan:  1. Acute non-recurrent pansinusitis  - amoxicillin-clavulanate (AUGMENTIN) 875-125 MG tablet; Take 1 tablet by mouth 2 (two) times daily.  Dispense: 20 tablet; Refill: 0  2. Essential hypertension - Will increase her losartan to 100 mg daily. Continue with Metoprolol  - Follow up in 2 weeks  - losartan (COZAAR) 100 MG tablet; Take 1 tablet (100 mg total) by mouth daily.  Dispense: 90 tablet; Refill: 1  3. Lipoma of right upper extremity  - Ambulatory referral to Hand Surgery  4. Ganglion cyst  - Ambulatory referral to Hand Surgery  Shirline Frees, NP  Time spent with patient today was 32 minutes which consisted of chart review, discussing sinusitis, HTN, lipomas and cysts, work up, treatment answering questions and documentation.

## 2022-11-28 ENCOUNTER — Other Ambulatory Visit: Payer: Self-pay | Admitting: Adult Health

## 2022-12-03 ENCOUNTER — Encounter: Payer: Self-pay | Admitting: Adult Health

## 2022-12-03 ENCOUNTER — Ambulatory Visit: Payer: Medicare Other | Admitting: Adult Health

## 2022-12-03 VITALS — BP 140/82 | HR 62 | Temp 98.5°F | Ht 70.0 in | Wt 170.0 lb

## 2022-12-03 DIAGNOSIS — I10 Essential (primary) hypertension: Secondary | ICD-10-CM | POA: Diagnosis not present

## 2022-12-03 DIAGNOSIS — Z23 Encounter for immunization: Secondary | ICD-10-CM | POA: Diagnosis not present

## 2022-12-03 DIAGNOSIS — E559 Vitamin D deficiency, unspecified: Secondary | ICD-10-CM | POA: Diagnosis not present

## 2022-12-03 LAB — BASIC METABOLIC PANEL
BUN: 22 mg/dL (ref 6–23)
CO2: 28 meq/L (ref 19–32)
Calcium: 9.2 mg/dL (ref 8.4–10.5)
Chloride: 103 meq/L (ref 96–112)
Creatinine, Ser: 1.21 mg/dL — ABNORMAL HIGH (ref 0.40–1.20)
GFR: 46.2 mL/min — ABNORMAL LOW (ref >60.00)
Glucose, Bld: 101 mg/dL — ABNORMAL HIGH (ref 70–99)
Potassium: 3.6 meq/L (ref 3.5–5.1)
Sodium: 139 meq/L (ref 135–145)

## 2022-12-03 LAB — VITAMIN D 25 HYDROXY (VIT D DEFICIENCY, FRACTURES): VITD: 21.07 ng/mL — ABNORMAL LOW (ref 30.00–100.00)

## 2022-12-03 NOTE — Progress Notes (Signed)
Subjective:    Patient ID: Laurie [name], female    DOB: Jul 01, 1955, 67 y.o.   MRN: [id number]  HPI  67 year old female who  has a past medical history of Allergy, Anxiety, Arthritis, Asthma, Chronic back pain, COPD (chronic obstructive pulmonary disease) (HCC), Depression, Esophageal stricture, GERD (gastroesophageal reflux disease), Heart murmur, Hematemesis (09/10/2018), Hyperlipidemia, Hypertension, IBS (irritable bowel syndrome), Interstitial cystitis, Neuromuscular disorder (HCC), and PONV (postoperative nausea and vomiting).  He presents to the office today for follow-up regarding hypertension.  When she was seen 2 weeks ago she was managed with losartan 50 mg daily and metoprolol 50 mg every morning.  At home she was getting blood pressure readings in the 170s to 200s systolic.  She denied any chest pain or vision changes.  She did have some headaches periodically.  I increased her losartan to 100 mg daily and metoprolol was kept at 50 mg daily.  Today she reports that her blood pressure is improved to some degree. At home her blood pressure has been in the 160-170's levels. She has been walking more and has cut back on smoking.    She would also like to check her Vitamin D - her vitamin D has been low in the past. She stopped taking Vitamin D a few months ago Last vitamin D Lab Results  Component Value Date   VD25OH 19.05 (L) 02/13/2022      Review of Systems See HPI   Past Medical History:  Diagnosis Date   Allergy    Anxiety    Arthritis    RA   Asthma    Chronic back pain    COPD (chronic obstructive pulmonary disease) (HCC)    Depression    Esophageal stricture    GERD (gastroesophageal reflux disease)    Heart murmur    Hematemesis 09/10/2018   Hyperlipidemia    Hypertension    IBS (irritable bowel syndrome)    Interstitial cystitis    Neuromuscular disorder (HCC)    fibromyalgia   PONV (postoperative nausea and vomiting)     Social History    Socioeconomic History   Marital status: Single    Spouse name: Not on file   Number of children: Not on file   Years of education: Not on file   Highest education level: Not on file  Occupational History   Occupation: disability  Tobacco Use   Smoking status: Every Day    Current packs/day: 0.50    Types: Cigarettes   Smokeless tobacco: Never   Tobacco comments:    trying to quit   Vaping Use   Vaping status: Never Used  Substance and Sexual Activity   Alcohol use: Never    Comment: occasional   Drug use: Yes    Types: Marijuana    Comment: occas   Sexual activity: Not on file  Other Topics Concern   Not on file  Social History Narrative   Not on file   Social Determinants of Health   Financial Resource Strain: Low Risk  (06/17/2022)   Overall Financial Resource Strain (CARDIA)    Difficulty of Paying Living Expenses: Not hard at all  Food Insecurity: No Food Insecurity (07/15/2022)   Hunger Vital Sign    Worried About Running Out of Food in the Last Year: Never true    Ran Out of Food in the Last Year: Never true  Transportation Needs: No Transportation Needs (06/17/2022)   PRAPARE - Transportation    Lack of Transportation (  Medical): No    Lack of Transportation (Non-Medical): No  Physical Activity: Sufficiently Active (06/17/2022)   Exercise Vital Sign    Days of Exercise per Week: 7 days    Minutes of Exercise per Session: 30 min  Stress: No Stress Concern Present (06/17/2022)   [employer] of Occupational Health - Occupational Stress Questionnaire    Feeling of Stress : Not at all  Social Connections: Socially Integrated (06/17/2022)   Social Connection and Isolation Panel [NHANES]    Frequency of Communication with Friends and Family: More than three times a week    Frequency of Social Gatherings with Friends and Family: More than three times a week    Attends Religious Services: More than 4 times per year    Active Member of Clubs or Organizations: Yes     Attends Banker Meetings: More than 4 times per year    Marital Status: Living with partner  Intimate Partner Violence: Not At Risk (06/17/2022)   Humiliation, Afraid, Rape, and Kick questionnaire    Fear of Current or Ex-Partner: No    Emotionally Abused: No    Physically Abused: No    Sexually Abused: No    Past Surgical History:  Procedure Laterality Date   ABDOMINAL HYSTERECTOMY     APPENDECTOMY     BIOPSY  09/11/2018   Procedure: BIOPSY;  Surgeon: Meridee Score, Netty Starring., MD;  Location: [facility];  Service: Gastroenterology;;   CERVICAL FUSION     CERVICAL LAMINECTOMY     CORONARY STENT INTERVENTION N/A 10/07/2019   Procedure: CORONARY STENT INTERVENTION;  Surgeon: Kathleene Hazel, MD;  Location: [facility];  Service: Cardiovascular;  Laterality: N/A;   ESOPHAGOGASTRODUODENOSCOPY     ESOPHAGOGASTRODUODENOSCOPY (EGD) WITH PROPOFOL N/A 09/11/2018   Procedure: ESOPHAGOGASTRODUODENOSCOPY (EGD) WITH PROPOFOL;  Surgeon: Meridee Score Netty Starring., MD;  Location: [facility] ENDOSCOPY;  Service: Gastroenterology;  Laterality: N/A;   FINGER SURGERY     KYPHOPLASTY N/A 03/29/2021   Procedure: Lumbar Three KYPHOPLASTY;  Surgeon: Barnett Abu, MD;  Location: [facility] OR;  Service: Neurosurgery;  Laterality: N/A;   LEFT HEART CATH AND CORONARY ANGIOGRAPHY N/A 10/07/2019   Procedure: LEFT HEART CATH AND CORONARY ANGIOGRAPHY;  Surgeon: Kathleene Hazel, MD;  Location: [facility];  Service: Cardiovascular;  Laterality: N/A;   LEFT HEART CATH AND CORONARY ANGIOGRAPHY N/A 06/20/2022   Procedure: LEFT HEART CATH AND CORONARY ANGIOGRAPHY;  Surgeon: Kathleene Hazel, MD;  Location: [facility];  Service: Cardiovascular;  Laterality: N/A;   LUMBAR FUSION     SAVORY DILATION N/A 09/11/2018   Procedure: SAVORY DILATION;  Surgeon: Meridee Score Netty Starring., MD;  Location: [facility] ENDOSCOPY;  Service: Gastroenterology;  Laterality: N/A;   TONSILLECTOMY      Family History   Problem Relation Age of Onset   Dementia Mother    Heart attack Father    Coronary artery disease Father    Cancer Brother        esophageal   Esophageal cancer Brother    Coronary artery disease Paternal Aunt    Stomach cancer Paternal Aunt    Coronary artery disease Paternal Grandmother    Aneurysm Brother        aortic   Rectal cancer Neg Hx    Colon cancer Neg Hx    Thyroid disease Neg Hx     Allergies  Allergen Reactions   Codeine Nausea And Vomiting   Lisinopril Cough   Sulfonamide Derivatives Nausea And Vomiting  Tetracyclines & Related Rash    Current Outpatient Medications on File Prior to Visit  Medication Sig Dispense Refill   albuterol (VENTOLIN HFA) 108 (90 Base) MCG/ACT inhaler INHALE 2 PUFFS BY MOUTH EVERY 6 HOURS AS NEEDED FOR SHORTNESS OF BREATH AND WHEEZING 8.5 g 10   amoxicillin-clavulanate (AUGMENTIN) 875-125 MG tablet Take 1 tablet by mouth 2 (two) times daily. 20 tablet 0   aspirin EC 81 MG tablet Take 1 tablet (81 mg total) by mouth daily. Swallow whole. 90 tablet 3   atorvastatin (LIPITOR) 40 MG tablet Take 1 tablet (40 mg total) by mouth every evening. 90 tablet 3   Blood Pressure Monitoring DEVI Use as needed DX I10 1 each 1   clopidogrel (PLAVIX) 75 MG tablet Take 1 tablet (75 mg total) by mouth daily. 90 tablet 3   fluticasone (FLONASE) 50 MCG/ACT nasal spray INSTILL TWO (2) SPRAYS IN EACH NOSTRIL DAILY 16 g 10   furosemide (LASIX) 20 MG tablet TAKE ONE TABLET BY MOUTH ONCE daily AS NEEDED FOR LEG EDEMA 30 tablet 3   lidocaine (XYLOCAINE) 2 % solution Apply 5 ml  around gum of affected teeth up to 4 times per day. 100 mL 0   losartan (COZAAR) 100 MG tablet Take 1 tablet (100 mg total) by mouth daily. 90 tablet 1   methimazole (TAPAZOLE) 10 MG tablet Take 1 tablet (10 mg total) by mouth every evening. 30 tablet 1   metoprolol succinate (TOPROL-XL) 50 MG 24 hr tablet TAKE 1 TABLET BY MOUTH EVERY MORNING WITH OR IMMEDIATELY FOLLOWING A MEAL 30 tablet  10   ondansetron (ZOFRAN-ODT) 8 MG disintegrating tablet TAKE ONE TABLET BY MOUTH every EIGHT hours AS NEEDED FOR NAUSEA AND VOMITING 20 tablet 3   pantoprazole (PROTONIX) 40 MG tablet TAKE ONE TABLET BY MOUTH EVERY MORNING (Patient taking differently: Take 40 mg by mouth daily.) 90 tablet 3   venlafaxine XR (EFFEXOR-XR) 150 MG 24 hr capsule TAKE 1 CAPSULE BY MOUTH EVERY MORNING 30 capsule 10   fenofibrate (TRICOR) 145 MG tablet Take 1 tablet (145 mg total) by mouth daily. 90 tablet 3   nitroGLYCERIN (NITROSTAT) 0.4 MG SL tablet Place 1 tablet (0.4 mg total) under the tongue every 5 (five) minutes as needed for chest pain. 25 tablet 1   No current facility-administered medications on file prior to visit.    BP (!) 140/82   Pulse 62   Temp 98.5 F (36.9 C) (Oral)   Ht 5\' 10"  (1.778 m)   Wt 170 lb (77.1 kg)   SpO2 97%   BMI 24.39 kg/m       Objective:   Physical Exam Vitals and nursing note reviewed.  Constitutional:      Appearance: Normal appearance.  Cardiovascular:     Rate and Rhythm: Normal rate and regular rhythm.  Pulmonary:     Effort: Pulmonary effort is normal.     Breath sounds: Normal breath sounds.  Abdominal:     General: Abdomen is flat.     Palpations: Abdomen is soft.  Musculoskeletal:        General: Normal range of motion.  Skin:    General: Skin is warm and dry.  Neurological:     General: No focal deficit present.     Mental Status: She is alert and oriented to person, place, and time.  Psychiatric:        Mood and Affect: Mood normal.        Behavior: Behavior normal.  Thought Content: Thought content normal.        Judgment: Judgment normal.       Assessment & Plan:  1. Essential hypertension - Her BP has improved and in the office we had two separate readings in the 140's/80's. We should get ore benefit in the next 2-3 weeks. Keep an eye on BP at home and follow up if not improving.  - Basic Metabolic Panel; Future  2. Vitamin D  deficiency - Consider adding back Vitamin D supplement  - VITAMIN D 25 Hydroxy (Vit-D Deficiency, Fractures); Future  Shirline Frees, NP

## 2022-12-03 NOTE — Addendum Note (Signed)
Addended by: Waymon Amato R on: 12/03/2022 04:45 PM   Modules accepted: Orders

## 2022-12-09 ENCOUNTER — Ambulatory Visit (INDEPENDENT_AMBULATORY_CARE_PROVIDER_SITE_OTHER): Payer: Medicare Other | Admitting: Family Medicine

## 2022-12-09 ENCOUNTER — Telehealth: Payer: Self-pay | Admitting: Adult Health

## 2022-12-09 ENCOUNTER — Encounter: Payer: Self-pay | Admitting: Family Medicine

## 2022-12-09 VITALS — BP 148/78 | HR 60 | Temp 97.5°F | Ht 70.0 in | Wt 174.9 lb

## 2022-12-09 DIAGNOSIS — R600 Localized edema: Secondary | ICD-10-CM | POA: Diagnosis not present

## 2022-12-09 DIAGNOSIS — I10 Essential (primary) hypertension: Secondary | ICD-10-CM | POA: Diagnosis not present

## 2022-12-09 NOTE — Patient Instructions (Signed)
Elevate legs frequently  Try to keep daily sodium intake < 2,000 mg   Increase the Lasix to 40 mg daily (two 20 mg daily) for 3 days and then try dropping back to one daily until follow up with [facility].   Weigh yourself daily and keep log of weights.

## 2022-12-09 NOTE — Progress Notes (Signed)
Established Patient Office Visit  Subjective   Patient ID: Laurie [name], female    DOB: Feb 09, 1956  Age: 67 y.o. MRN: [id number]  Chief Complaint  Patient presents with   Edema    Patient complains of Bilateral leg edema, x3 days    Back Pain    Patient complains of back pain, x6 months, Tried Ibuprofen     HPI   Laurie [name] is seen today with bilateral leg edema.  She has had similar issues in the past.  No known history of congestive heart failure.  She does have multiple chronic problems including history of CAD, hypertension, IBS, reflux esophagitis, hyperthyroidism currently on medical therapy, degenerative disc disease cervical spine, recurrent depression, hyperlipidemia, history of substance abuse, longstanding history of nicotine use.  She states that she just went on methadone treatment a few days ago.  She noted increased weight gain of 5 pounds over the past few days and possibly as much as 8 pounds over the past week.  She has noted bilateral leg edema.  Denies any ibuprofen or nonsteroidal use for the past month.  Occasional shortness of breath with lying flat.  Echocardiogram 8/23 revealed EF of 60 to 65%.  He had repeat heart cath back in May of this year with mild nonobstructive disease proximal to mid LAD.  Mid circumflex stents showed no evidence for restenosis.  She denies any new change in medications other than increase in losartan dose from 50 to 100 mg.  Her blood pressures been recently poorly controlled.  She states she has had some readings as high as 200 systolic.  Had recent basic metabolic panel just less than a week ago with creatinine 1.21 and GFR 46.    It sounds like she is eating of quite a bit of sodium in her diet but this has been chronic and unchanged.  She likes to snack on high salty foods.  Does not eat out much.  Hyperthyroidism is managed with medication.  She had recent thyroid functions which were stable.  Past Medical History:  Diagnosis  Date   Allergy    Anxiety    Arthritis    RA   Asthma    Chronic back pain    COPD (chronic obstructive pulmonary disease) (HCC)    Depression    Esophageal stricture    GERD (gastroesophageal reflux disease)    Heart murmur    Hematemesis 09/10/2018   Hyperlipidemia    Hypertension    IBS (irritable bowel syndrome)    Interstitial cystitis    Neuromuscular disorder (HCC)    fibromyalgia   PONV (postoperative nausea and vomiting)    Past Surgical History:  Procedure Laterality Date   ABDOMINAL HYSTERECTOMY     APPENDECTOMY     BIOPSY  09/11/2018   Procedure: BIOPSY;  Surgeon: Lemar Lofty., MD;  Location: [facility] ENDOSCOPY;  Service: Gastroenterology;;   CERVICAL FUSION     CERVICAL LAMINECTOMY     CORONARY STENT INTERVENTION N/A 10/07/2019   Procedure: CORONARY STENT INTERVENTION;  Surgeon: Kathleene Hazel, MD;  Location: [facility] INVASIVE CV LAB;  Service: Cardiovascular;  Laterality: N/A;   ESOPHAGOGASTRODUODENOSCOPY     ESOPHAGOGASTRODUODENOSCOPY (EGD) WITH PROPOFOL N/A 09/11/2018   Procedure: ESOPHAGOGASTRODUODENOSCOPY (EGD) WITH PROPOFOL;  Surgeon: Meridee Score Netty Starring., MD;  Location: [facility] ENDOSCOPY;  Service: Gastroenterology;  Laterality: N/A;   FINGER SURGERY     KYPHOPLASTY N/A 03/29/2021   Procedure: Lumbar Three KYPHOPLASTY;  Surgeon: Barnett Abu, MD;  Location: [facility]  OR;  Service: Neurosurgery;  Laterality: N/A;   LEFT HEART CATH AND CORONARY ANGIOGRAPHY N/A 10/07/2019   Procedure: LEFT HEART CATH AND CORONARY ANGIOGRAPHY;  Surgeon: Kathleene Hazel, MD;  Location: [facility] INVASIVE CV LAB;  Service: Cardiovascular;  Laterality: N/A;   LEFT HEART CATH AND CORONARY ANGIOGRAPHY N/A 06/20/2022   Procedure: LEFT HEART CATH AND CORONARY ANGIOGRAPHY;  Surgeon: Kathleene Hazel, MD;  Location: [facility] INVASIVE CV LAB;  Service: Cardiovascular;  Laterality: N/A;   LUMBAR FUSION     SAVORY DILATION N/A 09/11/2018   Procedure: SAVORY DILATION;  Surgeon: Meridee Score  Netty Starring., MD;  Location: [facility] ENDOSCOPY;  Service: Gastroenterology;  Laterality: N/A;   TONSILLECTOMY      reports that she has been smoking cigarettes. She has never used smokeless tobacco. She reports current drug use. Drug: Marijuana. She reports that she does not drink alcohol. family history includes Aneurysm in her brother; Cancer in her brother; Coronary artery disease in her father, paternal aunt, and paternal grandmother; Dementia in her mother; Esophageal cancer in her brother; Heart attack in her father; Stomach cancer in her paternal aunt. Allergies  Allergen Reactions   Codeine Nausea And Vomiting   Lisinopril Cough   Sulfonamide Derivatives Nausea And Vomiting   Tetracyclines & Related Rash    Review of Systems  Constitutional:  Negative for chills, fever and malaise/fatigue.  Eyes:  Negative for blurred vision.  Respiratory:  Negative for cough and hemoptysis.   Cardiovascular:  Positive for leg swelling. Negative for chest pain.  Neurological:  Negative for dizziness, weakness and headaches.      Objective:     BP (!) 148/78 (BP Location: Left Arm, Cuff Size: Normal)   Pulse 60   Temp (!) 97.5 F (36.4 C) (Oral)   Ht 5\' 10"  (1.778 m)   Wt 174 lb 14.4 oz (79.3 kg)   SpO2 98%   BMI 25.10 kg/m  BP Readings from Last 3 Encounters:  12/09/22 (!) 148/78  12/03/22 (!) 140/82  11/19/22 (!) 170/80   Wt Readings from Last 3 Encounters:  12/09/22 174 lb 14.4 oz (79.3 kg)  12/03/22 170 lb (77.1 kg)  11/19/22 167 lb (75.8 kg)      Physical Exam Vitals reviewed.  Constitutional:      General: She is not in acute distress.    Appearance: She is well-developed. She is not ill-appearing.  Eyes:     Pupils: Pupils are equal, round, and reactive to light.  Neck:     Thyroid: No thyromegaly.     Vascular: No JVD.  Cardiovascular:     Rate and Rhythm: Normal rate and regular rhythm.     Heart sounds:     No gallop.  Pulmonary:     Effort: Pulmonary effort is  normal. No respiratory distress.     Breath sounds: Normal breath sounds. No wheezing or rales.  Musculoskeletal:     Cervical back: Neck supple.     Comments: She has 1+ pitting edema lower legs bilaterally.  Neurological:     Mental Status: She is alert.      No results found for any visits on 12/09/22.  Last CBC Lab Results  Component Value Date   WBC 9.2 06/10/2022   HGB 12.4 06/10/2022   HCT 38.1 06/10/2022   MCV 86 06/10/2022   MCH 28.0 06/10/2022   RDW 13.0 06/10/2022   PLT 316 06/10/2022   Last metabolic panel Lab Results  Component Value Date   GLUCOSE  101 (H) 12/03/2022   NA 139 12/03/2022   K 3.6 12/03/2022   CL 103 12/03/2022   CO2 28 12/03/2022   BUN 22 12/03/2022   CREATININE 1.21 (H) 12/03/2022   GFR 46.20 (L) 12/03/2022   CALCIUM 9.2 12/03/2022   PHOS 3.4 09/16/2018   PROT 7.3 02/13/2022   ALBUMIN 4.2 02/13/2022   LABGLOB 2.4 10/08/2021   AGRATIO 1.8 10/08/2021   BILITOT 0.3 02/13/2022   ALKPHOS 52 02/13/2022   AST 14 02/13/2022   ALT 12 02/13/2022   ANIONGAP 10 03/29/2021   Last lipids Lab Results  Component Value Date   CHOL 120 02/13/2022   HDL 43.50 02/13/2022   LDLCALC 59 02/13/2022   LDLDIRECT 123.0 03/14/2017   TRIG 88.0 02/13/2022   CHOLHDL 3 02/13/2022   Last thyroid functions Lab Results  Component Value Date   TSH 1.86 11/05/2022      The ASCVD Risk score (Arnett DK, et al., 2019) failed to calculate for the following reasons:   The valid total cholesterol range is 130 to 320 mg/dL    Assessment & Plan:   #1 bilateral leg edema.  Multiple chronic problems as above.  Presents now with about 3 to 4-day history of progressive weight gain and bilateral leg edema.  Lung exam relatively clear.  Appears to be somewhat volume overloaded.  We suggested the following  -Check additional labs with comprehensive metabolic panel, BNP, urine microalbumin screen -Elevate legs frequently -Try to keep daily sodium intake less than  2000 mg -Start back furosemide 20 mg 2 tablets daily for 3 days then try dropping back to 1 daily -Set up follow-up with primary within the next week or so to reassess  #2 hypertension.  Suboptimally controlled by reading today.  Just recently had losartan titrated up to 100 mg.  Reduce sodium intake as above.  Follow-up within the next week or 2 with primary to consider additional medication if not further to goal   No follow-ups on file.    Evelena Peat, MD

## 2022-12-10 LAB — COMPREHENSIVE METABOLIC PANEL
ALT: 11 U/L (ref 0–35)
AST: 13 U/L (ref 0–37)
Albumin: 3.9 g/dL (ref 3.5–5.2)
Alkaline Phosphatase: 57 U/L (ref 39–117)
BUN: 16 mg/dL (ref 6–23)
CO2: 31 meq/L (ref 19–32)
Calcium: 9.3 mg/dL (ref 8.4–10.5)
Chloride: 102 meq/L (ref 96–112)
Creatinine, Ser: 1 mg/dL (ref 0.40–1.20)
GFR: 58.07 mL/min — ABNORMAL LOW (ref >60.00)
Glucose, Bld: 79 mg/dL (ref 70–99)
Potassium: 4.4 meq/L (ref 3.5–5.1)
Sodium: 138 meq/L (ref 135–145)
Total Bilirubin: 0.4 mg/dL (ref 0.2–1.2)
Total Protein: 6.9 g/dL (ref 6.0–8.3)

## 2022-12-10 LAB — MICROALBUMIN / CREATININE URINE RATIO
Creatinine,U: 46.7 mg/dL
Microalb Creat Ratio: 2.3 mg/g (ref 0.0–30.0)
Microalb, Ur: 1.1 mg/dL (ref 0.0–1.9)

## 2022-12-10 LAB — BRAIN NATRIURETIC PEPTIDE: Pro B Natriuretic peptide (BNP): 179 pg/mL — ABNORMAL HIGH (ref 0.0–100.0)

## 2022-12-26 DIAGNOSIS — M67439 Ganglion, unspecified wrist: Secondary | ICD-10-CM | POA: Diagnosis not present

## 2022-12-26 DIAGNOSIS — M79641 Pain in right hand: Secondary | ICD-10-CM | POA: Diagnosis not present

## 2022-12-27 ENCOUNTER — Other Ambulatory Visit: Payer: Self-pay | Admitting: Adult Health

## 2022-12-30 ENCOUNTER — Ambulatory Visit: Payer: Medicare Other | Attending: Nurse Practitioner

## 2022-12-30 DIAGNOSIS — I2510 Atherosclerotic heart disease of native coronary artery without angina pectoris: Secondary | ICD-10-CM

## 2022-12-30 DIAGNOSIS — E785 Hyperlipidemia, unspecified: Secondary | ICD-10-CM

## 2023-01-04 NOTE — Progress Notes (Unsigned)
Cardiology Office Note:  .   Date:  01/06/2023  ID:  Laurie [name], DOB [birth year], MRN [id number] PCP: Laurie Frees, NP  [facility] Providers Cardiologist:  Laurie Carrow, MD    Patient Profile: .      PMH Coronary artery disease LHC 2011 >>mild CAD LHC 10/07/2019 Severe mid circumflex/OM stenosis treated with DES x 2 Mild disease prox LAD Moderate disease mRCA Normal LVEF LHC 07/10/2022 Proximal LAD 40 Mid RCA 50 Previously placed proximal Cx to mid Cx stent patent Mildly elevated LVEDP COPD/asthma Hypertension Rheumatoid arthritis GERD Tobacco abuse Hyperlipidemia Graves' disease  Established care with Dr. Clifton [name] August 2021 with concerns for chest pain and dyspnea.  Echo 2019 with LVEF 65%, mild MR.  Cardiac cath 2011 with mild CAD.  Chronically abnormal EKG.  Cardiac cath 10/07/2019 with severe mid circumflex/OM stenosis treated with DES x 2, mild disease in proximal LAD and moderate disease in mRCA.  LV systolic function was normal.  She had dyspnea on Brilinta and was switched to Plavix and continued on aspirin.  Seen by me 09/25/2021 at which time she reported more fatigue, increased SOB, and bilateral lower extremity edema.  She had orthopnea having increased number of pillows and some PND.  Occasional sharp chest pain but not worse with exertion.  She was also falling frequently.  2D echo 10/08/2021 revealed normal LVEF 60 to 65%, mild LVH, normal diastolic parameters, trivial TR.  Lower extremity vascular studies revealed normal ABI bilaterally but slightly abnormal waveforms distally, mostly biphasic.  She had normal velocities throughout the lower extremities.    Referred to Dr. Kirke [name] for [facility] consult and seen 10/16/2021.  He felt her symptoms were more consistent with lumbar spine etiology and no further testing recommended.  Seen by Dr. Clifton [name] 06/10/2022 with dizziness, dyspnea, and chest pain similar to previous angina.  She underwent cardiac  catheterization 06/20/2022 which revealed mild nonobstructive disease in proximal to mid LAD, no change since last cath.  Patent mid circumflex stents extending into the obtuse marginal branch with no evidence of restenosis, moderate nonobstructive disease in mid RCA unchanged from last cath.  Mild elevation in LV filling pressure advised to start Lasix 20 mg daily for dyspnea.  Last cardiology clinic visit was 07/10/2022 with me.  She continued to smoke but had reduced to 10 cigarettes/day.  She reported no significant chest pain since cardiac cath.  She had fallen straight back in her kitchen a few months prior.  She was also having right toe numbness.  Was walking 30 minutes around her house daily and denied concerning cardiac symptoms with activity.       History of Present Illness: .   Laurie [name] is a pleasant 67 y.o. female who is here today for follow-up of CAD. She unfortunately continues to battle substance abuse of fentanyl. Is now attending methadone clinic. Has had multiple falls and has significant bilateral leg swelling. Reports she continues to have back and leg pain. Leg swelling has been increasing up the leg and is associated with a tingling and burning sensation. Has a history of neck and back problems, seen by Dr. Danielle [name]. Recently fell and broke a finger and toe. She has been experiencing lightheadedness quite often, but cannot pinpoint a specific trigger.  Had an episode of chest pain last month, severe enough to consider going to the hospital, but the pain subsided. Has never taken nitroglycerin. Has been on methadone for pain management for about a month, which she reports  has made her very sleepy and emotional. She has also been experiencing dry mouth, which she is unsure if it is a side effect of the methadone or withdrawal from fentanyl. The patient has been taking her medications as prescribed, but there seems to be some confusion about the dosages and whether all necessary  medications are included in her pill pack.  She feels more comfortable sleeping in a partially upright position. She denies chest pain, shortness of breath, PND, syncope.    Discussed the use of AI scribe software for clinical note transcription with the patient, who gave verbal consent to proceed.   ROS: See HPI       Studies Reviewed: .        Risk Assessment/Calculations:             Physical Exam:   VS:  BP 130/80   Pulse (!) 56   Ht 5\' 10"  (1.778 m)   Wt 168 lb 9.6 oz (76.5 kg)   SpO2 99%   BMI 24.19 kg/m    Wt Readings from Last 3 Encounters:  01/06/23 168 lb 9.6 oz (76.5 kg)  12/09/22 174 lb 14.4 oz (79.3 kg)  12/03/22 170 lb (77.1 kg)    GEN: Well nourished, well developed in no acute distress EYES: Pinpoint pupils  NECK: No JVD; No carotid bruits CARDIAC: RRR, no murmurs, rubs, gallops RESPIRATORY:  Clear to auscultation without rales, wheezing or rhonchi  ABDOMEN: Soft, non-tender, non-distended EXTREMITIES:  No edema; No deformity     ASSESSMENT AND PLAN: .    CAD without angina: Cardiac catheterization 06/20/2022 with mild nonobstructive disease in proximal to mid LAD, no change since last cath, patent circumflex stents with no evidence of restenosis, moderate nonobstructive disease in the mid RCA unchanged from last cath.  Mildly elevated LVEDP for which she was advised to take Lasix 20 mg daily. She reports she has been feeling with the exception of one episode of concerning chest pain last week. She does not take sublingual nitroglycerin which I have encouraged her to take as needed. She currently denies chest pain, dyspnea, or other symptoms concerning for angina.  No indication for further ischemic evaluation at this time. Clopidogrel is not in her current pill pack. I emphasized the importance of resuming clopidogrel 75 mg daily. Continue GDMT including aspirin, atorvastatin, losartan, metoprolol, and clopidogrel.   Hypertension: Unclear medication regimen with  losartan as 100 mg is documented but her pill pack contains 50 mg. She has not been taking furosemide daily as prescribed.  BP is well-controlled today.  Due to frequent lightheadedness, I will have her continue losartan at 50 mg and start furosemide 20 mg daily.  I have encouraged her to check paperwork with pill bottles and pill packs to ensure correct doses. Monitor home blood pressure and report concerns.  Hyperlipidemia LDL goal < 70: Lipid panel 02/13/2022 with LDL 59, triglycerides 88, HDL 43, total cholesterol 308.  She has not taken fenofibrate for unknown amount of time.  We will go ahead and remove it from her list.  We will repeat lipid panel when she returns for lab testing to evaluate renal function on diuretic therapy. Continue atorvastatin.  Mild carotid artery stenosis: Mild 1-39% bilateral stenosis on duplex 10/18/21. Continues to have lightheadedness and frequent falls.  Falls seem to be secondary more to leg numbness then to lightheadedness.  I have encouraged her to follow-up with Dr. Danielle [name], neurosurgery.  Continue clopidogrel, aspirin, and atorvastatin.  Bilateral leg edema:  Bilateral lower extremity swelling, worse recently. No reported dyspnea or PND. Reports she frequently sleeps slightly elevated. She is uncertain if she is taking daily Lasix as prescribed. Lasix is not included in her daily pill pack.  She reports some improvement in edema when she wakes up in the morning.  She has somewhat limited salt intake.  We will resume Lasix 20 mg daily and check BMP in 1 week.  Encouraged leg elevation, compression, and low sodium diet.   Frequent falls: Frequent falls of unknown etiology.  Per her description, she describes leg and foot numbness and tingling.  I have encouraged her to consider a cane for assistance especially when getting out of bed to prevent further falls.  It is unclear if substance use is contributing to falling.  Substance abuse: She reports she is on methadone for  fentanyl abuse. She is having some mouth dryness and emotional changes and wonders if these are signs of withdrawal. Pupils are pinpoint. She reports weekly appointments at methadone clinic. Encouraged continued compliance with methadone program.        Dispo: 1 year with Dr. Clifton [name]  Signed, Eligha Bridegroom, NP-C

## 2023-01-06 ENCOUNTER — Telehealth: Payer: Self-pay | Admitting: *Deleted

## 2023-01-06 ENCOUNTER — Other Ambulatory Visit: Payer: Self-pay | Admitting: *Deleted

## 2023-01-06 ENCOUNTER — Encounter: Payer: Self-pay | Admitting: Nurse Practitioner

## 2023-01-06 ENCOUNTER — Ambulatory Visit: Payer: Medicare Other | Attending: Nurse Practitioner | Admitting: Nurse Practitioner

## 2023-01-06 VITALS — BP 130/80 | HR 56 | Ht 70.0 in | Wt 168.6 lb

## 2023-01-06 DIAGNOSIS — I2510 Atherosclerotic heart disease of native coronary artery without angina pectoris: Secondary | ICD-10-CM

## 2023-01-06 DIAGNOSIS — R600 Localized edema: Secondary | ICD-10-CM | POA: Diagnosis not present

## 2023-01-06 DIAGNOSIS — F1910 Other psychoactive substance abuse, uncomplicated: Secondary | ICD-10-CM | POA: Diagnosis not present

## 2023-01-06 DIAGNOSIS — E785 Hyperlipidemia, unspecified: Secondary | ICD-10-CM

## 2023-01-06 DIAGNOSIS — I6523 Occlusion and stenosis of bilateral carotid arteries: Secondary | ICD-10-CM

## 2023-01-06 DIAGNOSIS — I739 Peripheral vascular disease, unspecified: Secondary | ICD-10-CM

## 2023-01-06 DIAGNOSIS — I10 Essential (primary) hypertension: Secondary | ICD-10-CM

## 2023-01-06 DIAGNOSIS — R296 Repeated falls: Secondary | ICD-10-CM | POA: Diagnosis not present

## 2023-01-06 MED ORDER — NITROGLYCERIN 0.4 MG SL SUBL: 0.4000 mg | SUBLINGUAL_TABLET | SUBLINGUAL | 3 refills | Status: AC | PRN

## 2023-01-06 MED ORDER — CLOPIDOGREL BISULFATE 75 MG PO TABS: 75.0000 mg | ORAL_TABLET | Freq: Every day | ORAL | 3 refills | Status: DC

## 2023-01-06 NOTE — Patient Instructions (Signed)
Medication Instructions:   Please pick up nitroglycerin at [facility].   Take Lasix one (1) tablet by mouth ( 20 mg) daily.   *If you need a refill on your cardiac medications before your next appointment, please call your pharmacy*   Lab Work:   None ordered.  If you have labs (blood work) drawn today and your tests are completely normal, you will receive your results only by: MyChart Message (if you have MyChart) OR A paper copy in the mail If you have any lab test that is abnormal or we need to change your treatment, we will call you to review the results.   Testing/Procedures:  None ordered.   Follow-Up: At [facility], you and your health needs are our priority.  As part of our continuing mission to provide you with exceptional heart care, we have created designated Provider Care Teams.  These Care Teams include your primary Cardiologist (physician) and Advanced Practice Providers (APPs -  Physician Assistants and Nurse Practitioners) who all work together to provide you with the care you need, when you need it.  We recommend signing up for the patient portal called "MyChart".  Sign up information is provided on this After Visit Summary.  MyChart is used to connect with patients for Virtual Visits (Telemedicine).  Patients are able to view lab/test results, encounter notes, upcoming appointments, etc.  Non-urgent messages can be sent to your provider as well.   To learn more about what you can do with MyChart, go to [email or web address].    Your next appointment:   1 year(s)  Provider:   Verne Carrow, MD     Other Instructions  Your physician wants you to follow-up in: 1 year.  You will receive a reminder letter in the mail two months in advance. If you don't receive a letter, please call our office to schedule the follow-up appointment.

## 2023-01-06 NOTE — Telephone Encounter (Signed)
Received this message from Lebron Conners after pt left ov visit:  SORRY - needs labs next Wed or Thurs - CMET and Lipid.   S/w pt is aware to come fasting either Dec 4 or 5. Orders placed in system and released.

## 2023-01-07 ENCOUNTER — Other Ambulatory Visit: Payer: Self-pay | Admitting: Adult Health

## 2023-01-08 ENCOUNTER — Ambulatory Visit
Admission: RE | Admit: 2023-01-08 | Discharge: 2023-01-08 | Disposition: A | Discharge status: Home / Self Care | Payer: Medicare Other | Source: Ambulatory Visit | Attending: "Endocrinology | Admitting: "Endocrinology

## 2023-01-08 ENCOUNTER — Other Ambulatory Visit (HOSPITAL_COMMUNITY)
Admission: RE | Admit: 2023-01-08 | Discharge: 2023-01-08 | Disposition: A | Discharge status: Home / Self Care | Payer: Medicare Other | Source: Ambulatory Visit | Attending: "Endocrinology | Admitting: "Endocrinology

## 2023-01-08 DIAGNOSIS — E041 Nontoxic single thyroid nodule: Secondary | ICD-10-CM | POA: Insufficient documentation

## 2023-01-08 DIAGNOSIS — E0500 Thyrotoxicosis with diffuse goiter without thyrotoxic crisis or storm: Secondary | ICD-10-CM

## 2023-01-13 LAB — CYTOLOGY - NON PAP

## 2023-01-23 ENCOUNTER — Other Ambulatory Visit: Payer: Self-pay

## 2023-01-23 DIAGNOSIS — E0500 Thyrotoxicosis with diffuse goiter without thyrotoxic crisis or storm: Secondary | ICD-10-CM

## 2023-01-23 DIAGNOSIS — E041 Nontoxic single thyroid nodule: Secondary | ICD-10-CM

## 2023-01-28 ENCOUNTER — Other Ambulatory Visit: Payer: Self-pay | Admitting: Adult Health

## 2023-01-28 ENCOUNTER — Other Ambulatory Visit: Payer: Self-pay | Admitting: "Endocrinology

## 2023-01-28 DIAGNOSIS — E0500 Thyrotoxicosis with diffuse goiter without thyrotoxic crisis or storm: Secondary | ICD-10-CM

## 2023-01-29 ENCOUNTER — Emergency Department (HOSPITAL_COMMUNITY)
Admission: EM | Admit: 2023-01-29 | Discharge: 2023-01-30 | Disposition: A | Discharge status: Home / Self Care | Payer: Medicare Other | Attending: Emergency Medicine | Admitting: Emergency Medicine

## 2023-01-29 ENCOUNTER — Encounter (HOSPITAL_COMMUNITY): Payer: Self-pay

## 2023-01-29 ENCOUNTER — Other Ambulatory Visit: Payer: Self-pay

## 2023-01-29 DIAGNOSIS — I10 Essential (primary) hypertension: Secondary | ICD-10-CM | POA: Insufficient documentation

## 2023-01-29 DIAGNOSIS — E049 Nontoxic goiter, unspecified: Secondary | ICD-10-CM | POA: Diagnosis not present

## 2023-01-29 DIAGNOSIS — J449 Chronic obstructive pulmonary disease, unspecified: Secondary | ICD-10-CM | POA: Insufficient documentation

## 2023-01-29 DIAGNOSIS — R42 Dizziness and giddiness: Secondary | ICD-10-CM | POA: Diagnosis not present

## 2023-01-29 DIAGNOSIS — J45909 Unspecified asthma, uncomplicated: Secondary | ICD-10-CM | POA: Diagnosis not present

## 2023-01-29 DIAGNOSIS — Z955 Presence of coronary angioplasty implant and graft: Secondary | ICD-10-CM | POA: Insufficient documentation

## 2023-01-29 DIAGNOSIS — R791 Abnormal coagulation profile: Secondary | ICD-10-CM | POA: Diagnosis not present

## 2023-01-29 DIAGNOSIS — Z79899 Other long term (current) drug therapy: Secondary | ICD-10-CM | POA: Diagnosis not present

## 2023-01-29 DIAGNOSIS — H532 Diplopia: Secondary | ICD-10-CM | POA: Diagnosis not present

## 2023-01-29 DIAGNOSIS — W01198A Fall on same level from slipping, tripping and stumbling with subsequent striking against other object, initial encounter: Secondary | ICD-10-CM | POA: Diagnosis not present

## 2023-01-29 DIAGNOSIS — I6782 Cerebral ischemia: Secondary | ICD-10-CM | POA: Diagnosis not present

## 2023-01-29 DIAGNOSIS — R29818 Other symptoms and signs involving the nervous system: Secondary | ICD-10-CM | POA: Diagnosis not present

## 2023-01-29 DIAGNOSIS — F17210 Nicotine dependence, cigarettes, uncomplicated: Secondary | ICD-10-CM | POA: Diagnosis not present

## 2023-01-29 DIAGNOSIS — I6523 Occlusion and stenosis of bilateral carotid arteries: Secondary | ICD-10-CM | POA: Diagnosis not present

## 2023-01-29 LAB — BASIC METABOLIC PANEL
Anion gap: 10 (ref 5–15)
BUN: 16 mg/dL (ref 8–23)
CO2: 28 mmol/L (ref 22–32)
Calcium: 9.4 mg/dL (ref 8.9–10.3)
Chloride: 103 mmol/L (ref 98–111)
Creatinine, Ser: 1.02 mg/dL — ABNORMAL HIGH (ref 0.44–1.00)
GFR, Estimated: 60 mL/min — ABNORMAL LOW (ref >60)
Glucose, Bld: 107 mg/dL — ABNORMAL HIGH (ref 70–99)
Potassium: 3.8 mmol/L (ref 3.5–5.1)
Sodium: 141 mmol/L (ref 135–145)

## 2023-01-29 LAB — CBC
HCT: 36.2 % (ref 36.0–46.0)
Hemoglobin: 11.5 g/dL — ABNORMAL LOW (ref 12.0–15.0)
MCH: 28 pg (ref 26.0–34.0)
MCHC: 31.8 g/dL (ref 30.0–36.0)
MCV: 88.3 fL (ref 80.0–100.0)
Platelets: 259 10*3/uL (ref 150–400)
RBC: 4.1 MIL/uL (ref 3.87–5.11)
RDW: 13.3 % (ref 11.5–15.5)
WBC: 8.2 10*3/uL (ref 4.0–10.5)
nRBC: 0 % (ref 0.0–0.2)

## 2023-01-29 LAB — URINALYSIS, ROUTINE W REFLEX MICROSCOPIC
Bilirubin Urine: NEGATIVE
Glucose, UA: NEGATIVE mg/dL
Hgb urine dipstick: NEGATIVE
Ketones, ur: NEGATIVE mg/dL
Leukocytes,Ua: NEGATIVE
Nitrite: NEGATIVE
Protein, ur: NEGATIVE mg/dL
Specific Gravity, Urine: 1.012 (ref 1.005–1.030)
pH: 8 (ref 5.0–8.0)

## 2023-01-29 NOTE — ED Triage Notes (Signed)
Pt came to ED for fall that happed 3-4 days ago and is now seeing double. Pt hit head and states she is on blood thinners but does not know name. C/O dizziness before and after falls. Pt states she has been falling more recently. Pt states she falls due to numbness form back surgery.

## 2023-01-30 ENCOUNTER — Emergency Department (HOSPITAL_COMMUNITY): Payer: Medicare Other

## 2023-01-30 ENCOUNTER — Other Ambulatory Visit: Payer: Medicare Other

## 2023-01-30 DIAGNOSIS — R29818 Other symptoms and signs involving the nervous system: Secondary | ICD-10-CM | POA: Diagnosis not present

## 2023-01-30 DIAGNOSIS — E049 Nontoxic goiter, unspecified: Secondary | ICD-10-CM | POA: Diagnosis not present

## 2023-01-30 DIAGNOSIS — I6782 Cerebral ischemia: Secondary | ICD-10-CM | POA: Diagnosis not present

## 2023-01-30 DIAGNOSIS — I6523 Occlusion and stenosis of bilateral carotid arteries: Secondary | ICD-10-CM | POA: Diagnosis not present

## 2023-01-30 DIAGNOSIS — R42 Dizziness and giddiness: Secondary | ICD-10-CM | POA: Diagnosis not present

## 2023-01-30 LAB — CBC WITH DIFFERENTIAL/PLATELET
Abs Immature Granulocytes: 0.03 10*3/uL (ref 0.00–0.07)
Basophils Absolute: 0.1 10*3/uL (ref 0.0–0.1)
Basophils Relative: 1 %
Eosinophils Absolute: 0.4 10*3/uL (ref 0.0–0.5)
Eosinophils Relative: 5 %
HCT: 39 % (ref 36.0–46.0)
Hemoglobin: 12.4 g/dL (ref 12.0–15.0)
Immature Granulocytes: 0 %
Lymphocytes Relative: 28 %
Lymphs Abs: 2.3 10*3/uL (ref 0.7–4.0)
MCH: 28.4 pg (ref 26.0–34.0)
MCHC: 31.8 g/dL (ref 30.0–36.0)
MCV: 89.2 fL (ref 80.0–100.0)
Monocytes Absolute: 0.6 10*3/uL (ref 0.1–1.0)
Monocytes Relative: 7 %
Neutro Abs: 4.9 10*3/uL (ref 1.7–7.7)
Neutrophils Relative %: 59 %
Platelets: 245 10*3/uL (ref 150–400)
RBC: 4.37 MIL/uL (ref 3.87–5.11)
RDW: 13.1 % (ref 11.5–15.5)
WBC: 8.3 10*3/uL (ref 4.0–10.5)
nRBC: 0 % (ref 0.0–0.2)

## 2023-01-30 LAB — I-STAT CHEM 8, ED
BUN: 14 mg/dL (ref 8–23)
Calcium, Ion: 1.17 mmol/L (ref 1.15–1.40)
Chloride: 102 mmol/L (ref 98–111)
Creatinine, Ser: 0.9 mg/dL (ref 0.44–1.00)
Glucose, Bld: 88 mg/dL (ref 70–99)
HCT: 37 % (ref 36.0–46.0)
Hemoglobin: 12.6 g/dL (ref 12.0–15.0)
Potassium: 3.7 mmol/L (ref 3.5–5.1)
Sodium: 139 mmol/L (ref 135–145)
TCO2: 27 mmol/L (ref 22–32)

## 2023-01-30 LAB — PROTIME-INR
INR: 0.9 (ref 0.8–1.2)
Prothrombin Time: 12.8 s (ref 11.4–15.2)

## 2023-01-30 LAB — APTT: aPTT: 48 s — ABNORMAL HIGH (ref 24–36)

## 2023-01-30 LAB — RAPID URINE DRUG SCREEN, HOSP PERFORMED
Amphetamines: NOT DETECTED
Barbiturates: NOT DETECTED
Benzodiazepines: NOT DETECTED
Cocaine: NOT DETECTED
Opiates: NOT DETECTED
Tetrahydrocannabinol: NOT DETECTED

## 2023-01-30 LAB — ETHANOL: Alcohol, Ethyl (B): <10 mg/dL (ref <10)

## 2023-01-30 MED ORDER — ONDANSETRON HCL 4 MG PO TABS: 4.0000 mg | ORAL_TABLET | Freq: Four times a day (QID) | ORAL | 0 refills | Status: DC

## 2023-01-30 MED ORDER — MECLIZINE HCL 25 MG PO TABS
25.0000 mg | ORAL_TABLET | Freq: Once | ORAL | Status: AC
  Administered 2023-01-30: 25 mg via ORAL
  Filled 2023-01-30: qty 1

## 2023-01-30 MED ORDER — LORAZEPAM 2 MG/ML IJ SOLN
2.0000 mg | Freq: Once | INTRAMUSCULAR | Status: AC | PRN
  Administered 2023-01-30: 2 mg via INTRAVENOUS
  Filled 2023-01-30: qty 1

## 2023-01-30 MED ORDER — MECLIZINE HCL 25 MG PO TABS: 25.0000 mg | ORAL_TABLET | Freq: Three times a day (TID) | ORAL | 0 refills | Status: AC | PRN

## 2023-01-30 MED ORDER — IOHEXOL 350 MG/ML SOLN
75.0000 mL | Freq: Once | INTRAVENOUS | Status: AC | PRN
  Administered 2023-01-30: 75 mL via INTRAVENOUS

## 2023-01-30 MED ORDER — ONDANSETRON HCL 4 MG/2ML IJ SOLN
4.0000 mg | Freq: Once | INTRAMUSCULAR | Status: AC
  Administered 2023-01-30: 4 mg via INTRAVENOUS
  Filled 2023-01-30: qty 2

## 2023-01-30 NOTE — Discharge Instructions (Signed)
You were seen in the emergency department for your dizziness.  Your dizziness appeared to be vertigo which is the room spinning type of sensation.  Your workup showed no signs of stroke and is likely caused from an inner ear issue.  You can take meclizine as needed for dizziness and Zofran as needed for nausea.  You can follow-up with your primary doctor to have your symptoms rechecked or ENT for recurrent symptoms.  You should return to the emergency department if you are having worsening dizziness and you are unable to walk, having repetitive vomiting despite the nausea medicine, you are having numbness or weakness in 1 side of your body compared to the other or if you have any other new or concerning symptoms.

## 2023-01-30 NOTE — ED Notes (Signed)
Pt unable to lay still for MRI, secure message sent to M. Bero MD requesting medication before pt go back to MRI. Awaiting response

## 2023-01-30 NOTE — ED Notes (Signed)
Family has arrived to drive patient home.

## 2023-01-30 NOTE — ED Notes (Signed)
Pt is still groggy, not answering questions.

## 2023-01-30 NOTE — ED Notes (Signed)
Pt went to MRI. Will check Temp when they get back.

## 2023-01-30 NOTE — ED Provider Notes (Signed)
[facility]-EMERGENCY DEPT [facility] Emergency Department Provider Note MRN:  [id number]  Arrival date & time: 01/30/23     Chief Complaint   Fall   History of Present Illness   [name] is a 67 y.o. year-old female with a history of COPD ED, hypertension presenting to the ED with chief complaint of dizziness.  Persistent dizziness for the past few days as well as occasional double vision.  Larey Seat and hit her head.  Denies numbness or weakness to the arms or legs, no chest pain or shortness of breath, no abdominal pain.  Review of Systems  A thorough review of systems was obtained and all systems are negative except as noted in the HPI and PMH.   Patient's Health History    Past Medical History:  Diagnosis Date   Allergy    Anxiety    Arthritis    RA   Asthma    Chronic back pain    COPD (chronic obstructive pulmonary disease) (HCC)    Depression    Esophageal stricture    GERD (gastroesophageal reflux disease)    Heart murmur    Hematemesis 09/10/2018   Hyperlipidemia    Hypertension    IBS (irritable bowel syndrome)    Interstitial cystitis    Neuromuscular disorder (HCC)    fibromyalgia   PONV (postoperative nausea and vomiting)     Past Surgical History:  Procedure Laterality Date   ABDOMINAL HYSTERECTOMY     APPENDECTOMY     BIOPSY  09/11/2018   Procedure: BIOPSY;  Surgeon: Lemar Lofty., MD;  Location: [facility] ENDOSCOPY;  Service: Gastroenterology;;   CERVICAL FUSION     CERVICAL LAMINECTOMY     CORONARY STENT INTERVENTION N/A 10/07/2019   Procedure: CORONARY STENT INTERVENTION;  Surgeon: Kathleene Hazel, MD;  Location: [facility] INVASIVE CV LAB;  Service: Cardiovascular;  Laterality: N/A;   ESOPHAGOGASTRODUODENOSCOPY     ESOPHAGOGASTRODUODENOSCOPY (EGD) WITH PROPOFOL N/A 09/11/2018   Procedure: ESOPHAGOGASTRODUODENOSCOPY (EGD) WITH PROPOFOL;  Surgeon: Meridee Score Netty Starring., MD;  Location: [facility] ENDOSCOPY;  Service: Gastroenterology;  Laterality:  N/A;   FINGER SURGERY     KYPHOPLASTY N/A 03/29/2021   Procedure: Lumbar Three KYPHOPLASTY;  Surgeon: Barnett Abu, MD;  Location: [facility] OR;  Service: Neurosurgery;  Laterality: N/A;   LEFT HEART CATH AND CORONARY ANGIOGRAPHY N/A 10/07/2019   Procedure: LEFT HEART CATH AND CORONARY ANGIOGRAPHY;  Surgeon: Kathleene Hazel, MD;  [facility] INV[facility] LAB;  Service: Cardiovascular;  Laterality: N/A;   LEFT HEART CATH AND CORONARY ANGIOGRAPHY N/A 06/20/2022   Procedure: LEFT HEART CATH AND CORONARY ANGIOGRAPHY;  Surgeon: Kathleene Hazel, MD;  Location[facility] INV[facility] LAB;  Service: Cardiovascular;  Laterality: N/A;   LUMBAR FUSION     SAVORY DILATION N/A 09/11/2018   Procedure: SAVORY DILATION;  Surgeon: Meridee Score Netty Starring., MD;  Location: [facility] ENDOSCOPY;  Service: Gastroenterology;  Laterality: N/A;   TONSILLECTOMY      Family History  Problem Relation Age of Onset   Dementia Mother    Heart attack Father    Coronary artery disease Father    Cancer Brother        esophageal   Esophageal cancer Brother    Coronary artery disease Paternal Aunt    Stomach cancer Paternal Aunt    Coronary artery disease Paternal Grandmother    Aneurysm Brother        aortic   Rectal cancer Neg Hx    Colon cancer Neg Hx    Thyroid  disease Neg Hx     Social History   Socioeconomic History   Marital status: Single    Spouse name: Not on file   Number of children: Not on file   Years of education: Not on file   Highest education level: Not on file  Occupational History   Occupation: disability  Tobacco Use   Smoking status: Every Day    Current packs/day: 0.50    Types: Cigarettes   Smokeless tobacco: Never   Tobacco comments:    trying to quit   Vaping Use   Vaping status: Never Used  Substance and Sexual Activity   Alcohol use: Never    Comment: occasional   Drug use: Yes    Types: Marijuana    Comment: occas   Sexual activity: Not on file  Other Topics Concern   Not on  file  Social History Narrative   Not on file   Social Drivers of Health   Financial Resource Strain: Low Risk  (06/17/2022)   Overall Financial Resource Strain (CARDIA)    Difficulty of Paying Living Expenses: Not hard at all  Food Insecurity: No Food Insecurity (07/15/2022)   Hunger Vital Sign    Worried About Running Out of Food in the Last Year: Never true    Ran Out of Food in the Last Year: Never true  Transportation Needs: No Transportation Needs (06/17/2022)   PRAPARE - Administrator, Civil Service (Medical): No    Lack of Transportation (Non-Medical): No  Physical Activity: Sufficiently Active (06/17/2022)   Exercise Vital Sign    Days of Exercise per Week: 7 days    Minutes of Exercise per Session: 30 min  Stress: No Stress Concern Present (06/17/2022)   [employer] of Occupational Health - Occupational Stress Questionnaire    Feeling of Stress : Not at all  Social Connections: Socially Integrated (06/17/2022)   Social Connection and Isolation Panel [NHANES]    Frequency of Communication with Friends and Family: More than three times a week    Frequency of Social Gatherings with Friends and Family: More than three times a week    Attends Religious Services: More than 4 times per year    Active Member of [employer] or Organizations: Yes    Attends Banker Meetings: More than 4 times per year    Marital Status: Living with partner  Intimate Partner Violence: Not At Risk (06/17/2022)   Humiliation, Afraid, Rape, and Kick questionnaire    Fear of Current or Ex-Partner: No    Emotionally Abused: No    Physically Abused: No    Sexually Abused: No     Physical Exam   Vitals:   01/30/23 0417 01/30/23 0617  BP:  (!) 141/62  Pulse:  62  Resp:  14  Temp: 98.3 F (36.8 C)   SpO2:  97%    CONSTITUTIONAL: Well-appearing, NAD NEURO/PSYCH:  Alert and oriented x 3, normal and symmetric strength and sensation, normal coordination, normal speech EYES:  eyes  equal and reactive ENT/NECK:  no LAD, no JVD CARDIO: Regular rate, well-perfused, normal S1 and S2 PULM:  CTAB no wheezing or rhonchi GI/GU:  non-distended, non-tender MSK/SPINE:  No gross deformities, no edema SKIN:  no rash, atraumatic   *Additional and/or pertinent findings included in MDM below  Diagnostic and Interventional Summary    EKG Interpretation Date/Time:  Wednesday January 29 2023 12:12:36 EST Ventricular Rate:  58 PR Interval:  168 QRS Duration:  106 QT  Interval:  456 QTC Calculation: 447 R Axis:   63  Text Interpretation: Sinus bradycardia ST & T wave abnormality, consider anterior ischemia Abnormal ECG When compared with ECG of 07-Oct-2019 09:22, PREVIOUS ECG IS PRESENT Confirmed by Kennis Carina [phone]) on 01/30/2023 2:19:12 AM       Labs Reviewed  BASIC METABOLIC PANEL - Abnormal; Notable for the following components:      Result Value   Glucose, Bld 107 (*)    Creatinine, Ser 1.02 (*)    GFR, Estimated 60 (*)    All other components within normal limits  CBC - Abnormal; Notable for the following components:   Hemoglobin 11.5 (*)    All other components within normal limits  APTT - Abnormal; Notable for the following components:   aPTT 48 (*)    All other components within normal limits  URINALYSIS, ROUTINE W REFLEX MICROSCOPIC  ETHANOL  PROTIME-INR  RAPID URINE DRUG SCREEN, HOSP PERFORMED  CBC WITH DIFFERENTIAL/PLATELET  CBG MONITORING, ED  I-STAT CHEM 8, ED    CT ANGIO HEAD NECK W WO CM  Final Result    MR BRAIN WO CONTRAST    (Results Pending)    Medications  LORazepam (ATIVAN) injection 2 mg (has no administration in time range)  iohexol (OMNIPAQUE) 350 MG/ML injection 75 mL (75 mLs Intravenous Contrast Given 01/30/23 0252)     Procedures  /  Critical Care Procedures  ED Course and Medical Decision Making  Initial Impression and Ddx Concern for possible acute ischemic stroke with the persistent dizziness and double vision.  Also  considering concussion related to recent trauma versus intracranial bleeding though the dizziness and double vision preceded the trauma.  Past medical/surgical history that increases complexity of ED encounter: COPD, hypertension  Interpretation of Diagnostics I personally reviewed the EKG and my interpretation is as follows: Sinus bradycardia  Labs without significant blood count or electrolyte disturbance  Patient Reassessment and Ultimate Disposition/Management     CTA unremarkable awaiting MRI signed out to oncoming provider at shift change.  Patient management required discussion with the following services or consulting groups:  None  Complexity of Problems Addressed Acute illness or injury that poses threat of life of bodily function  Additional Data Reviewed and Analyzed Further history obtained from: None  Additional Factors Impacting ED Encounter Risk Consideration of hospitalization  Elmer Sow. Pilar Plate, MD [facility] Emergency Medicine [facility] [email or web address]  Final Clinical Impressions(s) / ED Diagnoses     ICD-10-CM   1. Diplopia  H53.2     2. Dizziness  R42       ED Discharge Orders     None        Discharge Instructions Discussed with and Provided to Patient:   Discharge Instructions   None      Sabas Sous, MD 01/30/23 [phone]

## 2023-01-30 NOTE — ED Notes (Signed)
Patient transported to MRI 

## 2023-01-30 NOTE — ED Provider Notes (Signed)
Patient signed out to me at 07 100 by Dr. Orland Dec pending MRI.  In short this is a 67 year old female with past medical history of COPD presented to the emergency department with dizziness and double vision.  She reports that she feels like she is spinning and had some associated nausea.  She had labs and CTA performed overnight that were within normal range and is pending MRI at this time.  The patient reports that she is still symptomatic though she has no focal neurologic deficits on exam, increased dizziness with looking to the right but no appreciable nystagmus, normal finger-to-nose, normal gait.  Patient will be given meclizine and Zofran pending MRI.  Clinical Course as of 01/30/23 1532  Thu Jan 30, 2023  1610 No acute abnormality on MRI. Suspect peripheral vertigo. Plan will be for discharge home. Patient is still sedating appearing from the ativan for MRI. Will monitor until she is safe for discharge. [VK]  [id number] Patient still drowsy, waking up more easily but still falling back asleep on exam. [VK]  1129 Patient slightly more arousable and conversant but still easily falling back asleep on exam. Will continue to monitor. Will reach out to family to see if they are able to drive her home for safe disposition.  [VK]  1323 Patient is more awake, fiance is on the way to pick her up. [VK]    Clinical Course User Index [VK] Rexford Maus, DO      Rexford Maus, [place] 01/30/23 [phone]

## 2023-01-30 NOTE — ED Notes (Signed)
Pt returned from MRI and is still excessively tired. Pt reattached to monitor

## 2023-02-03 ENCOUNTER — Other Ambulatory Visit: Payer: Self-pay | Admitting: "Endocrinology

## 2023-02-03 ENCOUNTER — Other Ambulatory Visit: Payer: Medicare Other

## 2023-02-03 DIAGNOSIS — E041 Nontoxic single thyroid nodule: Secondary | ICD-10-CM | POA: Diagnosis not present

## 2023-02-03 DIAGNOSIS — E0500 Thyrotoxicosis with diffuse goiter without thyrotoxic crisis or storm: Secondary | ICD-10-CM | POA: Diagnosis not present

## 2023-02-04 LAB — T4, FREE: Free T4: 1 ng/dL (ref 0.8–1.8)

## 2023-02-04 LAB — T3, FREE: T3, Free: 3.6 pg/mL (ref 2.3–4.2)

## 2023-02-04 LAB — TSH: TSH: 3.46 m[IU]/L (ref 0.40–4.50)

## 2023-02-06 ENCOUNTER — Ambulatory Visit: Payer: Medicare Other | Admitting: "Endocrinology

## 2023-02-18 ENCOUNTER — Other Ambulatory Visit: Payer: Self-pay

## 2023-02-18 DIAGNOSIS — E0500 Thyrotoxicosis with diffuse goiter without thyrotoxic crisis or storm: Secondary | ICD-10-CM

## 2023-02-18 DIAGNOSIS — M26609 Unspecified temporomandibular joint disorder, unspecified side: Secondary | ICD-10-CM | POA: Diagnosis not present

## 2023-02-18 DIAGNOSIS — R42 Dizziness and giddiness: Secondary | ICD-10-CM | POA: Diagnosis not present

## 2023-02-18 DIAGNOSIS — I10 Essential (primary) hypertension: Secondary | ICD-10-CM | POA: Diagnosis not present

## 2023-02-18 DIAGNOSIS — H9193 Unspecified hearing loss, bilateral: Secondary | ICD-10-CM | POA: Diagnosis not present

## 2023-02-18 DIAGNOSIS — E041 Nontoxic single thyroid nodule: Secondary | ICD-10-CM | POA: Diagnosis not present

## 2023-02-18 MED ORDER — METHIMAZOLE 10 MG PO TABS: ORAL_TABLET | ORAL | 0 refills | Status: DC

## 2023-02-19 DIAGNOSIS — H9313 Tinnitus, bilateral: Secondary | ICD-10-CM | POA: Diagnosis not present

## 2023-02-19 DIAGNOSIS — H903 Sensorineural hearing loss, bilateral: Secondary | ICD-10-CM | POA: Diagnosis not present

## 2023-02-21 ENCOUNTER — Other Ambulatory Visit: Payer: Self-pay | Admitting: "Endocrinology

## 2023-02-21 DIAGNOSIS — E0500 Thyrotoxicosis with diffuse goiter without thyrotoxic crisis or storm: Secondary | ICD-10-CM

## 2023-02-27 ENCOUNTER — Ambulatory Visit: Payer: Medicare Other | Admitting: "Endocrinology

## 2023-02-27 ENCOUNTER — Other Ambulatory Visit: Payer: Self-pay | Admitting: "Endocrinology

## 2023-02-27 DIAGNOSIS — E0500 Thyrotoxicosis with diffuse goiter without thyrotoxic crisis or storm: Secondary | ICD-10-CM

## 2023-02-28 ENCOUNTER — Other Ambulatory Visit: Payer: Self-pay | Admitting: "Endocrinology

## 2023-02-28 DIAGNOSIS — E0500 Thyrotoxicosis with diffuse goiter without thyrotoxic crisis or storm: Secondary | ICD-10-CM

## 2023-03-27 ENCOUNTER — Ambulatory Visit: Payer: Self-pay | Admitting: Adult Health

## 2023-03-27 NOTE — Telephone Encounter (Addendum)
Chief Complaint: fall Symptoms: L arm pain and lac, neck pain Frequency: fell last night  Pertinent Negatives: Patient denies LOC, CP, SOB, dizziness, one-sided weakness/paralysis Disposition: [x] ED /[] Urgent Care (no appt availability in office) / [] Appointment(In office/virtual)/ []  [place] [facility]/ [] Home Care/ [] Refused Recommended Disposition /[] [place] [facility]/ []  Follow-up with PCP Additional Notes: Pt reports she fell last night in the bathroom. States she tripped while turning around and her back made contact with the wall. Pt states she defecated on herself due to the impact of the fall. Pt denies LOC. Pt states that afterwards she had 10/10 neck pain. Pt states neck pain has been ongoing but the fall made it worse. Today pt's neck pain is a 5/10.  Pt reports 3 falls in the last week. States she does not know why she continues falling. Pt states she hit her head last time she fell this week. Pt denies LOC at that time as well. Pt states she has been dealing with ongoing blurry/double vision, she was seen for that in the ED back in December. Pt states her blurry/double vision is the same today - no worse, no better.  Pt denies dizziness today and states her walking is unchanged. States she has had difficulty walking lately and veers off to one side. States that is no worse today than yesterday. RN inquired about confusion. Initially patient says no, and then she states she must have put two bowls out on the counter in the kitchen but doesn't remember.   Pt takes Plavix 75mg  but has not had it in the last week d/t an upcoming dental procedure. Pt also reports L arm pain with an inch long "gash" on the L arm that is still bleeding. States blood was "gushing" at first and now it's bleeding more slowly. Pt states she is bleeding through band-aids. Per protocol and given Plavix, 3 falls in 1 week, blurry/double vision, neck pain, and bleeding that won't stop, RN advised ED. Pt  initially declined stating last time she went she waited very long. RN educated the pt on why the ED is the safest option and pt became agreeable and said she will have her partner take her. RN advised pt she needs to have him call 911 for her if she loses consciousness or has any worsening before the time they leave, she verbalized understanding.    Copied from CRM [id number]. Topic: Clinical - Red Word Triage >> Mar 27, 2023 11:50 AM Louie Boston wrote: Red Word that prompted transfer to Nurse Triage: Fall Reason for Disposition  Injury (or injuries) that need emergency care  Answer Assessment - Initial Assessment Questions 1. MECHANISM: "How did the fall happen?"     Fell last night. "Well, I was in the bathroom, and I've been falling a lot. Dr. Kandee Keen knows about this. I just turned around and it must have been the way I turned, or something, but I tripped and started falling backwards. My upper neck and back has been killing me lately. It banged up against a wall and I fell on down and I cut my left arm." "It was a pretty hard fall." "I really hit my back hard up against the wall." "Larey Seat two years ago and broke my back." 3. ONSET: "When did the fall happen?" (e.g., minutes, hours, or days ago)     Last night  4. LOCATION: "What part of the body hit the ground?" (e.g., back, buttocks, head, hips, knees, hands, head, stomach)  Back  5. INJURY: "Did you hurt (injure) yourself when you fell?" If Yes, ask: "What did you injure? Tell me more about this?" (e.g., body area; type of injury; pain severity)"     Hit L arm on a dresser in the bathroom - "big gash." Soaking through band-aids. It just hasn't stopped, but it isn't "gushing anymore." Describes cut as a deep gash. Underneath the left arm is scraped but not bleeding.  6. PAIN: "Is there any pain?" If Yes, ask: "How bad is the pain?" (e.g., Scale 1-10; or mild,  moderate, severe)   - NONE (0): No pain   - MILD (1-3): Doesn't interfere with normal  activities    - MODERATE (4-7): Interferes with normal activities or awakens from sleep    - SEVERE (8-10): Excruciating pain, unable to do any normal activities      L arm pain is 4/10 where lac is sore. 5/10 pain in her neck at this time (was 10/10 pain the night after the fall - and states this started before the fall) 7. SIZE: For cuts, bruises, or swelling, ask: "How large is it?" (e.g., inches or centimeters)      "Probably in inch" 9. OTHER SYMPTOMS: "Do you have any other symptoms?" (e.g., dizziness, fever, weakness; new onset or worsening).      No LOC. "But I did have two other falls this week and I did hit my head earlier, my head still hurts, there is a knot on the left side." Pt currently taking Plavix. "I need to get some dental work and I am not supposed to take the blood thinner." Has not taken Plavix for a week for dental work. Endorses blurry vision and seeing double but states she was seen for this in the ED back in December. States she had blurry vision before the fall this week where she hit her head. Endorses headache "at the base of my neck, I woke up the other night and I was screaming and I could hardly move it." States neck pain started before the falls but got worse after the fall. Reports 10/10 pain the night she was screaming in pain, now it is a 5/10 mostly in the neck. "I've had nausea but I think I have a ruptured disc in my neck, I can't say I have become more nauseous after my fall." No dizziness right now. "My walking hasn't been good for a while, I start walking and go to one side", but pt states she is not walking any worse today than last night. Pt denies confusion or one-sided weakness, but then she saw two bowls out in the kitchen and her husband said she set them out and she doesn't remember." Said she defecated on herself when she fell d/t impact 10. CAUSE: "What do you think caused the fall (or falling)?" (e.g., tripped, dizzy spell) Pt not sure why she is falling  so much, states she has had double vision  Protocols used: Falls and [facility]

## 2023-04-03 ENCOUNTER — Ambulatory Visit: Payer: Self-pay | Admitting: Adult Health

## 2023-04-03 NOTE — Telephone Encounter (Signed)
 Copied from CRM [id number]. Topic: Clinical - Red Word Triage >> Apr 03, 2023 12:11 PM Kathryne Eriksson wrote: Patient states she has fallen 3 times, as well as has some concerns of possible disc injury in her lower back. Patient states she's having some unbearable pain and swelling in her feet, as well as feeling off-balance. She also stated that she's been dizzy as well.   Chief Complaint: Fall Symptoms: Pain, dizziness, swelling, vision changes Frequency: 3x in last week Pertinent Negatives: Patient denies relief Disposition: [x] ED /[] Urgent Care (no appt availability in office) / [] Appointment(In office/virtual)/ []  [place] [facility]/ [] Home Care/ [x] Refused Recommended Disposition /[] [place] [facility]/ []  Follow-up with PCP Additional Notes: Patient called in to report that she has fallen 3 times in the past week. Patient stated the falls are brought on by "woozy" spells. Patient stated all 3 falls have happened in the bathroom and have been "hard" falls. Patient stated she hit her head during one of the falls. Patient stated her neck and back are in extreme pain. Patient stated she cannot stand up straight and cries when she wakes up in the morning, due to the pain. Patient stated that she sustained a 1/2 inch laceration on her arm after one of the falls. Patient also stated she is seeing double, sometimes triple. Patient stated her BP was 200/100 last week. Patient reported that both of her feet are swollen. Patient believes she has a "dislocated disc" in her neck or back and would like an MRI. This RN advised patient to go to the ED. Patient declined and stated she wanted to be seen in the office. This RN stated that I would make the office aware, but that she should seek emergency care at this time. Patient stated that she would "think about going". This RN will notify [facility] of refusal.   Reason for Disposition  Injury (or injuries) that need emergency care  Answer Assessment - Initial  Assessment Questions 1. MECHANISM: "How did the fall happen?"     States she gets "woozy" and falls 3. ONSET: "When did the fall happen?" (e.g., minutes, hours, or days ago)     3 falls within the past week, states all falls have happened in the bathroom and have been "hard" falls 4. LOCATION: "What part of the body hit the ground?" (e.g., back, buttocks, head, hips, knees, hands, head, stomach)     States she hit her head during one of the falls 5. INJURY: "Did you hurt (injure) yourself when you fell?" If Yes, ask: "What did you injure? Tell me more about this?" (e.g., body area; type of injury; pain severity)"     Yes back and neck are still in extreme pain 6. PAIN: "Is there any pain?" If Yes, ask: "How bad is the pain?" (e.g., Scale 1-10; or mild,  moderate, severe)   - NONE (0): No pain   - MILD (1-3): Doesn't interfere with normal activities    - MODERATE (4-7): Interferes with normal activities or awakens from sleep    - SEVERE (8-10): Excruciating pain, unable to do any normal activities      Severe, states pain is a 10 radiating from left ear to behind neck and down back, states she cries because of the pain in the morning 7. SIZE: For cuts, bruises, or swelling, ask: "How large is it?" (e.g., inches or centimeters)      States cut is 1/2 inch on her arm 9. OTHER SYMPTOMS: "Do you have any  other symptoms?" (e.g., dizziness, fever, weakness; new onset or worsening).      Both feet are swollen, seeing double or triple, states she cannot stand up straight, BP was 200/100 last week 10. CAUSE: "What do you think caused the fall (or falling)?" (e.g., tripped, dizzy spell)       Unknown/dizziness  Protocols used: Falls and [facility]

## 2023-04-08 NOTE — Telephone Encounter (Signed)
 Spoke to pt and she stated that she is having severe neck pain with dizziness. She also stated that there have been a lot of falls but think it is due to pinch nerve. Pt advised to go to the ED but declined again. Also per pt her other provider think the dizziness and falls are coming from BP issues. Pt has been schedule for Friday.

## 2023-04-09 ENCOUNTER — Ambulatory Visit: Payer: Medicare Other | Admitting: "Endocrinology

## 2023-04-11 ENCOUNTER — Ambulatory Visit (INDEPENDENT_AMBULATORY_CARE_PROVIDER_SITE_OTHER): Payer: Medicare Other | Admitting: Adult Health

## 2023-04-11 ENCOUNTER — Ambulatory Visit: Payer: Medicare Other

## 2023-04-11 VITALS — BP 150/60 | Temp 97.6°F | Ht 70.0 in | Wt 174.8 lb

## 2023-04-11 DIAGNOSIS — M4856XA Collapsed vertebra, not elsewhere classified, lumbar region, initial encounter for fracture: Secondary | ICD-10-CM | POA: Diagnosis not present

## 2023-04-11 DIAGNOSIS — M5416 Radiculopathy, lumbar region: Secondary | ICD-10-CM

## 2023-04-11 DIAGNOSIS — M5412 Radiculopathy, cervical region: Secondary | ICD-10-CM

## 2023-04-11 DIAGNOSIS — Z4789 Encounter for other orthopedic aftercare: Secondary | ICD-10-CM | POA: Diagnosis not present

## 2023-04-11 DIAGNOSIS — M546 Pain in thoracic spine: Secondary | ICD-10-CM

## 2023-04-11 DIAGNOSIS — R42 Dizziness and giddiness: Secondary | ICD-10-CM | POA: Diagnosis not present

## 2023-04-11 DIAGNOSIS — R918 Other nonspecific abnormal finding of lung field: Secondary | ICD-10-CM | POA: Diagnosis not present

## 2023-04-11 DIAGNOSIS — M5126 Other intervertebral disc displacement, lumbar region: Secondary | ICD-10-CM | POA: Diagnosis not present

## 2023-04-11 DIAGNOSIS — M5134 Other intervertebral disc degeneration, thoracic region: Secondary | ICD-10-CM | POA: Diagnosis not present

## 2023-04-11 DIAGNOSIS — M545 Low back pain, unspecified: Secondary | ICD-10-CM | POA: Diagnosis not present

## 2023-04-11 DIAGNOSIS — Z981 Arthrodesis status: Secondary | ICD-10-CM | POA: Diagnosis not present

## 2023-04-11 DIAGNOSIS — R0602 Shortness of breath: Secondary | ICD-10-CM

## 2023-04-11 DIAGNOSIS — M438X5 Other specified deforming dorsopathies, thoracolumbar region: Secondary | ICD-10-CM | POA: Diagnosis not present

## 2023-04-11 DIAGNOSIS — R600 Localized edema: Secondary | ICD-10-CM

## 2023-04-11 DIAGNOSIS — M4804 Spinal stenosis, thoracic region: Secondary | ICD-10-CM | POA: Diagnosis not present

## 2023-04-11 DIAGNOSIS — M40204 Unspecified kyphosis, thoracic region: Secondary | ICD-10-CM | POA: Diagnosis not present

## 2023-04-11 DIAGNOSIS — M5136 Other intervertebral disc degeneration, lumbar region with discogenic back pain only: Secondary | ICD-10-CM | POA: Diagnosis not present

## 2023-04-11 MED ORDER — FUROSEMIDE 20 MG PO TABS: ORAL_TABLET | ORAL | 1 refills | Status: AC

## 2023-04-11 MED ORDER — PREDNISONE 50 MG PO TABS
ORAL_TABLET | ORAL | 0 refills | Status: DC
Start: 2023-04-11 — End: 2023-04-25

## 2023-04-11 NOTE — Patient Instructions (Addendum)
 Take Metoprolol 50 mg and Losartan 100 mg in the morning.  Take lasix 20 mg daily.   I am going to send in Prednisone 50 mg daily for 5 days   We will get some xrays today   Please follow up in 2 weeks

## 2023-04-11 NOTE — Progress Notes (Signed)
 Subjective:    Patient ID: Laurie [name], female    DOB: March 20, 1955, 68 y.o.   MRN: [id number]  HPI 68 year old female who is being evaluated today multiple issues.  The last week she has had recurrent falls, for in a week that occur while changing positions or standing.  These falls have resulted in her falling against walls in her home, she does report hitting her head and upper and lower back against a wall.  She finds that most of these falls are happening in the evening.  She will change positions and become lightheaded causing her to fall.  She was seen by ear nose and throat month ago for dizziness and was found to be orthostatic.  There were no signs of vertigo.  When asked about blood pressure medication she reports that she is taking her metoprolol 50 mg in the morning with losartan 50 mg in the morning and then takes losartan 100 mg in the evening.(Again October 2024 we changed her losartan dose from 50 mg to 100 mg).  She has been checking her blood pressure at home ports that her blood pressure has been high in the 200s systolic.  He reports not taking her Plavix for the last 2 weeks and she is trying to have some dental work done.  She denies headaches blurred vision currently.  She also has been having constant neck pain with constant radiating pain/numbness and tingling  down her left arm.  She will often wake up in the melanite due to this pain.  Discomfort will cause her to often drop things when she is trying to grip them.She has difficulty standing up straight due to severe back pain in her cervical, thoracic, and lumbar spine.  Patiently she reports numbness and tingling down her left leg but this is not as consistent as her left arm.  She does have trouble walking when she is having numbness and tingling going down her left leg.  Does have a history of cervical laminectomy and fusion as well as lumbar fusion.  Other concern today is that of lower extremity edema, she reports  that about a week ago she put on "10 pounds in a week" but has been able to lose some weight.  She has not been taking her Lasix.  Additionally she reports worsening shortness of breath over the last month or so.  She can have shortness of breath at rest but that she has found that if she becomes more short of breath with exertion.  She has not had a cough.  Denies chest pain.  She continues to smoke.     Review of Systems See HPI   Past Medical History:  Diagnosis Date   Allergy    Anxiety    Arthritis    RA   Asthma    Chronic back pain    COPD (chronic obstructive pulmonary disease) (HCC)    Depression    Esophageal stricture    GERD (gastroesophageal reflux disease)    Heart murmur    Hematemesis 09/10/2018   Hyperlipidemia    Hypertension    IBS (irritable bowel syndrome)    Interstitial cystitis    Neuromuscular disorder (HCC)    fibromyalgia   PONV (postoperative nausea and vomiting)     Social History   Socioeconomic History   Marital status: Single    Spouse name: Not on file   Number of children: Not on file   Years of education: Not on file  Highest education level: Not on file  Occupational History   Occupation: disability  Tobacco Use   Smoking status: Every Day    Current packs/day: 0.50    Types: Cigarettes   Smokeless tobacco: Never   Tobacco comments:    trying to quit   Vaping Use   Vaping status: Never Used  Substance and Sexual Activity   Alcohol use: Never    Comment: occasional   Drug use: Yes    Types: Marijuana    Comment: occas   Sexual activity: Not on file  Other Topics Concern   Not on file  Social History Narrative   Not on file   Social Drivers of Health   Financial Resource Strain: Low Risk  (06/17/2022)   Overall Financial Resource Strain (CARDIA)    Difficulty of Paying Living Expenses: Not hard at all  Food Insecurity: No Food Insecurity (07/15/2022)   Hunger Vital Sign    Worried About Running Out of Food in the  Last Year: Never true    Ran Out of Food in the Last Year: Never true  Transportation Needs: No Transportation Needs (06/17/2022)   PRAPARE - Administrator, Civil Service (Medical): No    Lack of Transportation (Non-Medical): No  Physical Activity: Sufficiently Active (06/17/2022)   Exercise Vital Sign    Days of Exercise per Week: 7 days    Minutes of Exercise per Session: 30 min  Stress: No Stress Concern Present (06/17/2022)   [employer] of Occupational Health - Occupational Stress Questionnaire    Feeling of Stress : Not at all  Social Connections: Socially Integrated (06/17/2022)   Social Connection and Isolation Panel [NHANES]    Frequency of Communication with Friends and Family: More than three times a week    Frequency of Social Gatherings with Friends and Family: More than three times a week    Attends Religious Services: More than 4 times per year    Active Member of Clubs or Organizations: Yes    Attends Banker Meetings: More than 4 times per year    Marital Status: Living with partner  Intimate Partner Violence: Not At Risk (06/17/2022)   Humiliation, Afraid, Rape, and Kick questionnaire    Fear of Current or Ex-Partner: No    Emotionally Abused: No    Physically Abused: No    Sexually Abused: No    Past Surgical History:  Procedure Laterality Date   ABDOMINAL HYSTERECTOMY     APPENDECTOMY     BIOPSY  09/11/2018   Procedure: BIOPSY;  Surgeon: Meridee Score, Netty Starring., MD;  Location: [facility];  Service: Gastroenterology;;   CERVICAL FUSION     CERVICAL LAMINECTOMY     CORONARY STENT INTERVENTION N/A 10/07/2019   Procedure: CORONARY STENT INTERVENTION;  Surgeon: Kathleene Hazel, MD;  Location: [facility];  Service: Cardiovascular;  Laterality: N/A;   ESOPHAGOGASTRODUODENOSCOPY     ESOPHAGOGASTRODUODENOSCOPY (EGD) WITH PROPOFOL N/A 09/11/2018   Procedure: ESOPHAGOGASTRODUODENOSCOPY (EGD) WITH PROPOFOL;  Surgeon: Meridee Score  Netty Starring., MD;  Location: [facility] ENDOSCOPY;  Service: Gastroenterology;  Laterality: N/A;   FINGER SURGERY     KYPHOPLASTY N/A 03/29/2021   Procedure: Lumbar Three KYPHOPLASTY;  Surgeon: Barnett Abu, MD;  Location: [facility] OR;  Service: Neurosurgery;  Laterality: N/A;   LEFT HEART CATH AND CORONARY ANGIOGRAPHY N/A 10/07/2019   Procedure: LEFT HEART CATH AND CORONARY ANGIOGRAPHY;  Surgeon: Kathleene Hazel, MD;  Location: [facility];  Service: Cardiovascular;  Laterality:  N/A;   LEFT HEART CATH AND CORONARY ANGIOGRAPHY N/A 06/20/2022   Procedure: LEFT HEART CATH AND CORONARY ANGIOGRAPHY;  Surgeon: Kathleene Hazel, MD;  Location: [facility];  Service: Cardiovascular;  Laterality: N/A;   LUMBAR FUSION     SAVORY DILATION N/A 09/11/2018   Procedure: SAVORY DILATION;  Surgeon: Meridee Score Netty Starring., MD;  Location: [name] ENDOSCOPY;  Service: Gastroenterology;  Laterality: N/A;   TONSILLECTOMY      Family History  Problem Relation Age of Onset   Dementia Mother    Heart attack Father    Coronary artery disease Father    Cancer Brother        esophageal   Esophageal cancer Brother    Coronary artery disease Paternal Aunt    Stomach cancer Paternal Aunt    Coronary artery disease Paternal Grandmother    Aneurysm Brother        aortic   Rectal cancer Neg Hx    Colon cancer Neg Hx    Thyroid disease Neg Hx     Allergies  Allergen Reactions   Codeine Nausea And Vomiting   Lisinopril Cough   Sulfonamide Derivatives Nausea And Vomiting   Tetracyclines & Related Rash    Current Outpatient Medications on File Prior to Visit  Medication Sig Dispense Refill   albuterol (VENTOLIN HFA) 108 (90 Base) MCG/ACT inhaler INHALE 2 PUFFS BY MOUTH EVERY 6 HOURS AS NEEDED FOR SHORTNESS OF BREATH AND WHEEZING 8.5 g 10   aspirin EC 81 MG tablet Take 1 tablet (81 mg total) by mouth daily. Swallow whole. 90 tablet 3   atorvastatin (LIPITOR) 40 MG tablet TAKE 1 TABLET BY MOUTH EVERY  EVENING 30 tablet 10   fenofibrate (TRICOR) 145 MG tablet TAKE 1 TABLET BY MOUTH EVERY EVENING 30 tablet 10   fluticasone (FLONASE) 50 MCG/ACT nasal spray INSTILL TWO (2) SPRAYS IN EACH NOSTRIL DAILY 16 g 10   losartan (COZAAR) 100 MG tablet Take 100 mg by mouth daily.     meclizine (ANTIVERT) 25 MG tablet Take 1 tablet (25 mg total) by mouth 3 (three) times daily as needed for dizziness. 30 tablet 0   methadone (DOLOPHINE) 10 MG/ML solution Take 80 mg by mouth daily.     methimazole (TAPAZOLE) 10 MG tablet TAKE 1 TABLET BY MOUTH EACH EVENING 30 tablet 0   metoprolol succinate (TOPROL-XL) 50 MG 24 hr tablet TAKE 1 TABLET BY MOUTH EVERY MORNING WITH OR IMMEDIATELY FOLLOWING A MEAL 30 tablet 10   ondansetron (ZOFRAN) 4 MG tablet Take 1 tablet (4 mg total) by mouth every 6 (six) hours. 12 tablet 0   ondansetron (ZOFRAN-ODT) 8 MG disintegrating tablet PLACE 1 TABLET ON TONGUE AND ALLOW TO DISSOLVE EVERY 8 HOURS AS NEEDED FOR NAUSEA & VOMITING 20 tablet 10   pantoprazole (PROTONIX) 40 MG tablet TAKE 1 TABLET BY MOUTH EVERY MORNING 30 tablet 10   venlafaxine XR (EFFEXOR-XR) 150 MG 24 hr capsule TAKE 1 CAPSULE BY MOUTH EVERY MORNING 30 capsule 10   clopidogrel (PLAVIX) 75 MG tablet Take 1 tablet (75 mg total) by mouth daily. (Patient not taking: Reported on 04/11/2023) 90 tablet 3   nitroGLYCERIN (NITROSTAT) 0.4 MG SL tablet Place 1 tablet (0.4 mg total) under the tongue every 5 (five) minutes as needed for chest pain. 25 tablet 3   No current facility-administered medications on file prior to visit.    BP (!) 150/60   Temp 97.6 F (36.4 C) (Oral)   Ht 5\' 10"  (  1.778 m)   Wt 174 lb 12.8 oz (79.3 kg)   SpO2 98%   BMI 25.08 kg/m       Objective:   Physical Exam Vitals and nursing note reviewed.  Constitutional:      Appearance: Normal appearance. She is obese.  Cardiovascular:     Rate and Rhythm: Normal rate and regular rhythm.     Heart sounds: Normal heart sounds.  Pulmonary:      Effort: Pulmonary effort is normal.     Breath sounds: Normal breath sounds.  Abdominal:     General: Abdomen is flat. Bowel sounds are normal.     Palpations: Abdomen is soft.  Musculoskeletal:     Cervical back: Tenderness and bony tenderness present. Pain with movement present. Decreased range of motion.     Thoracic back: Tenderness present. No bony tenderness.     Lumbar back: Tenderness present. No bony tenderness.       Back:     Comments: + spurlings test bilaterally.   Skin:    General: Skin is warm and dry.  Neurological:     General: No focal deficit present.     Mental Status: She is alert and oriented to person, place, and time.  Psychiatric:        Mood and Affect: Mood normal.        Behavior: Behavior normal.        Thought Content: Thought content normal.        Judgment: Judgment normal.        Assessment & Plan:  1. Dizziness (Primary) -He was not orthostatic today with blood pressures consistently in the 150s to 160s over 60s.  She reports not taking losartan 100 mg last night.  I believe her dizziness is in the setting of too much losartan.  Will have her take her losartan 100 mg the morning with her metoprolol.  She does not need to take losartan any longer.  - Follow up in two weeks  2. Cervical radiculopathy - Will get xray today but likely need advanced imaging.  Since she is a fall this get this time I am okay with her continuing not to take Plavix for the time being. - DG Cervical Spine Complete; Future - predniSONE (DELTASONE) 50 MG tablet; Take every morning  Dispense: 5 tablet; Refill: 0  3. Thoracic spine pain  - DG Thoracic Spine 2 View; Future - predniSONE (DELTASONE) 50 MG tablet; Take every morning  Dispense: 5 tablet; Refill: 0  4. SOB (shortness of breath) - Needs to quit smoking. I have low suspicion for PE.  - predniSONE (DELTASONE) 50 MG tablet; Take every morning  Dispense: 5 tablet; Refill: 0 - DG Chest 2 View; Future  5. Lumbar  radiculopathy - Likely need advanced imaging  - DG Lumbar Spine Complete; Future - predniSONE (DELTASONE) 50 MG tablet; Take every morning  Dispense: 5 tablet; Refill: 0  6. Lower extremity edema -Placed back on Lasix 20 mg daily.  Will have her follow-up in 2 weeks.  This should help with her blood pressure as well. - furosemide (LASIX) 20 MG tablet; TAKE ONE TABLET BY MOUTH ONCE Daily  Dispense: 90 tablet; Refill: 1  Shirline Frees, NP  Time spent with patient today was 52 minutes which consisted of chart review, discussing dizziness, taking prescribed medication correctly, multiple back pain, and shortness of breath  work up, treatment answering questions and documentation.

## 2023-04-18 ENCOUNTER — Telehealth: Payer: Self-pay

## 2023-04-18 NOTE — Telephone Encounter (Signed)
 Copied from CRM [id number]. Topic: Clinical - Lab/Test Results >> Apr 18, 2023  3:14 PM Laurie [name] wrote: Reason for CRM: Patient is calling to speak to someone in regard to her xray results.

## 2023-04-25 ENCOUNTER — Ambulatory Visit (INDEPENDENT_AMBULATORY_CARE_PROVIDER_SITE_OTHER): Payer: Medicare Other | Admitting: Adult Health

## 2023-04-25 VITALS — BP 120/80 | HR 61 | Temp 98.0°F | Wt 177.0 lb

## 2023-04-25 DIAGNOSIS — I10 Essential (primary) hypertension: Secondary | ICD-10-CM

## 2023-04-25 DIAGNOSIS — M5416 Radiculopathy, lumbar region: Secondary | ICD-10-CM | POA: Diagnosis not present

## 2023-04-25 DIAGNOSIS — M5412 Radiculopathy, cervical region: Secondary | ICD-10-CM

## 2023-04-25 DIAGNOSIS — R600 Localized edema: Secondary | ICD-10-CM | POA: Diagnosis not present

## 2023-04-25 DIAGNOSIS — R2681 Unsteadiness on feet: Secondary | ICD-10-CM

## 2023-04-25 DIAGNOSIS — M79601 Pain in right arm: Secondary | ICD-10-CM | POA: Diagnosis not present

## 2023-04-25 DIAGNOSIS — M546 Pain in thoracic spine: Secondary | ICD-10-CM | POA: Diagnosis not present

## 2023-04-25 LAB — BASIC METABOLIC PANEL
BUN: 14 mg/dL (ref 6–23)
CO2: 31 meq/L (ref 19–32)
Calcium: 9.1 mg/dL (ref 8.4–10.5)
Chloride: 101 meq/L (ref 96–112)
Creatinine, Ser: 0.84 mg/dL (ref 0.40–1.20)
GFR: 71.4 mL/min (ref >60.00)
Glucose, Bld: 100 mg/dL — ABNORMAL HIGH (ref 70–99)
Potassium: 4.5 meq/L (ref 3.5–5.1)
Sodium: 138 meq/L (ref 135–145)

## 2023-04-25 MED ORDER — TRAMADOL HCL 50 MG PO TABS: 50.0000 mg | ORAL_TABLET | Freq: Four times a day (QID) | ORAL | 0 refills | Status: AC | PRN

## 2023-04-25 NOTE — Progress Notes (Signed)
 Subjective:    Patient ID: Laurie [name], female    DOB: April 18, 1955, 68 y.o.   MRN: [id number]  HPI 68 year old female who  has a past medical history of Allergy, Anxiety, Arthritis, Asthma, Chronic back pain, COPD (chronic obstructive pulmonary disease) (HCC), Depression, Esophageal stricture, GERD (gastroesophageal reflux disease), Heart murmur, Hematemesis (09/10/2018), Hyperlipidemia, Hypertension, IBS (irritable bowel syndrome), Interstitial cystitis, Neuromuscular disorder (HCC), and PONV (postoperative nausea and vomiting).  She presents to the office today for two week follow-up regarding multiple issues.   She was last seen 2 weeks ago she was having recurrent falls that were happening when changing positions or standing.  These falls had resulted in her falling against the walls at home and she reports hitting her head and upper and lower back against a wall.  She was seen by ENT and was found to be orthostatic.  We found out during the last visit that she was taking metoprolol 50 mg in the morning with losartan 50 mg and an additional 100 mg in the evening.  Despite this she was checking her blood pressure at home with readings in the high 200s systolic.  She had stopped her Plavix due to needing to have some dental work done and thankfully she did not restart it because of the frequent falls.  She was having constant neck pain with constant radiating numbness and tingling down her left arm.  She would often wake up in the middle the night due to pain.  This discomfort would cause her to drop things when she was trying to grip them.  She was having difficulty standing up straight due to severe back pain in her cervical, thoracic, and lumbar spine.  She was also reporting numbness and tingling down her left leg but this was not as consistent as in her left arm.  She was having trouble walking due to the numbness and tingling going down her left leg.  She does have a history of cervical  laminectomy and fusion as well as lumbar fusion.  There are more during her last visit she was having lower extremity edema.  She was not taking her Lasix at this time  During last visit we did x-ray his cervical thoracic and lumbar spine as well as a chest x-ray.  2 weeks later these have not resulted.  I had given her some steroids during the last visit and she did report that this helped to some degree.  Fortunately, she has fallen 2 more times since she was seen reports that this was not due to dizziness but instead tripping on throw rugs.  She is back to taking her medication as directed and does not seem to be having much dizziness with changing positions any longer.  During the most recent fall about a week ago she injured her right arm.  She does have full range of motion but does have a significant bruise on the underside of her forearm.  Her lower extremity edema has improved    Review of Systems See HPI   Past Medical History:  Diagnosis Date   Allergy    Anxiety    Arthritis    RA   Asthma    Chronic back pain    COPD (chronic obstructive pulmonary disease) (HCC)    Depression    Esophageal stricture    GERD (gastroesophageal reflux disease)    Heart murmur    Hematemesis 09/10/2018   Hyperlipidemia    Hypertension  IBS (irritable bowel syndrome)    Interstitial cystitis    Neuromuscular disorder (HCC)    fibromyalgia   PONV (postoperative nausea and vomiting)     Social History   Socioeconomic History   Marital status: Single    Spouse name: Not on file   Number of children: Not on file   Years of education: Not on file   Highest education level: Not on file  Occupational History   Occupation: disability  Tobacco Use   Smoking status: Every Day    Current packs/day: 0.50    Types: Cigarettes   Smokeless tobacco: Never   Tobacco comments:    trying to quit   Vaping Use   Vaping status: Never Used  Substance and Sexual Activity   Alcohol use: Never     Comment: occasional   Drug use: Yes    Types: Marijuana    Comment: occas   Sexual activity: Not on file  Other Topics Concern   Not on file  Social History Narrative   Not on file   Social Drivers of Health   Financial Resource Strain: Low Risk  (06/17/2022)   Overall Financial Resource Strain (CARDIA)    Difficulty of Paying Living Expenses: Not hard at all  Food Insecurity: No Food Insecurity (07/15/2022)   Hunger Vital Sign    Worried About Running Out of Food in the Last Year: Never true    Ran Out of Food in the Last Year: Never true  Transportation Needs: No Transportation Needs (06/17/2022)   PRAPARE - Administrator, Civil Service (Medical): No    Lack of Transportation (Non-Medical): No  Physical Activity: Sufficiently Active (06/17/2022)   Exercise Vital Sign    Days of Exercise per Week: 7 days    Minutes of Exercise per Session: 30 min  Stress: No Stress Concern Present (06/17/2022)   [employer] of Occupational Health - Occupational Stress Questionnaire    Feeling of Stress : Not at all  Social Connections: Socially Integrated (06/17/2022)   Social Connection and Isolation Panel [NHANES]    Frequency of Communication with Friends and Family: More than three times a week    Frequency of Social Gatherings with Friends and Family: More than three times a week    Attends Religious Services: More than 4 times per year    Active Member of Clubs or Organizations: Yes    Attends Banker Meetings: More than 4 times per year    Marital Status: Living with partner  Intimate Partner Violence: Not At Risk (06/17/2022)   Humiliation, Afraid, Rape, and Kick questionnaire    Fear of Current or Ex-Partner: No    Emotionally Abused: No    Physically Abused: No    Sexually Abused: No    Past Surgical History:  Procedure Laterality Date   ABDOMINAL HYSTERECTOMY     APPENDECTOMY     BIOPSY  09/11/2018   Procedure: BIOPSY;  Surgeon: Meridee Score, Netty Starring., MD;  Location: [facility];  Service: Gastroenterology;;   CERVICAL FUSION     CERVICAL LAMINECTOMY     CORONARY STENT INTERVENTION N/A 10/07/2019   Procedure: CORONARY STENT INTERVENTION;  Surgeon: Kathleene Hazel, MD;  Location: [facility];  Service: Cardiovascular;  Laterality: N/A;   ESOPHAGOGASTRODUODENOSCOPY     ESOPHAGOGASTRODUODENOSCOPY (EGD) WITH PROPOFOL N/A 09/11/2018   Procedure: ESOPHAGOGASTRODUODENOSCOPY (EGD) WITH PROPOFOL;  Surgeon: Meridee Score Netty Starring., MD;  Location: [facility] ENDOSCOPY;  Service: Gastroenterology;  Laterality: N/A;  FINGER SURGERY     KYPHOPLASTY N/A 03/29/2021   Procedure: Lumbar Three KYPHOPLASTY;  Surgeon: Barnett Abu, MD;  Location: [name] OR;  Service: Neurosurgery;  Laterality: N/A;   LEFT HEART CATH AND CORONARY ANGIOGRAPHY N/A 10/07/2019   Procedure: LEFT HEART CATH AND CORONARY ANGIOGRAPHY;  Surgeon: Kathleene Hazel, MD;  Location: [facility];  Service: Cardiovascular;  Laterality: N/A;   LEFT HEART CATH AND CORONARY ANGIOGRAPHY N/A 06/20/2022   Procedure: LEFT HEART CATH AND CORONARY ANGIOGRAPHY;  Surgeon: Kathleene Hazel, MD;  Location: [facility];  Service: Cardiovascular;  Laterality: N/A;   LUMBAR FUSION     SAVORY DILATION N/A 09/11/2018   Procedure: SAVORY DILATION;  Surgeon: Meridee Score Netty Starring., MD;  Location: [facility] ENDOSCOPY;  Service: Gastroenterology;  Laterality: N/A;   TONSILLECTOMY      Family History  Problem Relation Age of Onset   Dementia Mother    Heart attack Father    Coronary artery disease Father    Cancer Brother        esophageal   Esophageal cancer Brother    Coronary artery disease Paternal Aunt    Stomach cancer Paternal Aunt    Coronary artery disease Paternal Grandmother    Aneurysm Brother        aortic   Rectal cancer Neg Hx    Colon cancer Neg Hx    Thyroid disease Neg Hx     Allergies  Allergen Reactions   Codeine Nausea And Vomiting   Lisinopril Cough    Sulfonamide Derivatives Nausea And Vomiting   Tetracyclines & Related Rash    Current Outpatient Medications on File Prior to Visit  Medication Sig Dispense Refill   albuterol (VENTOLIN HFA) 108 (90 Base) MCG/ACT inhaler INHALE 2 PUFFS BY MOUTH EVERY 6 HOURS AS NEEDED FOR SHORTNESS OF BREATH AND WHEEZING 8.5 g 10   aspirin EC 81 MG tablet Take 1 tablet (81 mg total) by mouth daily. Swallow whole. 90 tablet 3   atorvastatin (LIPITOR) 40 MG tablet TAKE 1 TABLET BY MOUTH EVERY EVENING 30 tablet 10   clopidogrel (PLAVIX) 75 MG tablet Take 1 tablet (75 mg total) by mouth daily. 90 tablet 3   fenofibrate (TRICOR) 145 MG tablet TAKE 1 TABLET BY MOUTH EVERY EVENING 30 tablet 10   fluticasone (FLONASE) 50 MCG/ACT nasal spray INSTILL TWO (2) SPRAYS IN EACH NOSTRIL DAILY 16 g 10   furosemide (LASIX) 20 MG tablet TAKE ONE TABLET BY MOUTH ONCE Daily 90 tablet 1   losartan (COZAAR) 100 MG tablet Take 100 mg by mouth daily.     meclizine (ANTIVERT) 25 MG tablet Take 1 tablet (25 mg total) by mouth 3 (three) times daily as needed for dizziness. 30 tablet 0   methadone (DOLOPHINE) 10 MG/ML solution Take 80 mg by mouth daily.     methimazole (TAPAZOLE) 10 MG tablet TAKE 1 TABLET BY MOUTH EACH EVENING 30 tablet 0   metoprolol succinate (TOPROL-XL) 50 MG 24 hr tablet TAKE 1 TABLET BY MOUTH EVERY MORNING WITH OR IMMEDIATELY FOLLOWING A MEAL 30 tablet 10   ondansetron (ZOFRAN) 4 MG tablet Take 1 tablet (4 mg total) by mouth every 6 (six) hours. 12 tablet 0   ondansetron (ZOFRAN-ODT) 8 MG disintegrating tablet PLACE 1 TABLET ON TONGUE AND ALLOW TO DISSOLVE EVERY 8 HOURS AS NEEDED FOR NAUSEA & VOMITING 20 tablet 10   pantoprazole (PROTONIX) 40 MG tablet TAKE 1 TABLET BY MOUTH EVERY MORNING 30 tablet 10  venlafaxine XR (EFFEXOR-XR) 150 MG 24 hr capsule TAKE 1 CAPSULE BY MOUTH EVERY MORNING 30 capsule 10   nitroGLYCERIN (NITROSTAT) 0.4 MG SL tablet Place 1 tablet (0.4 mg total) under the tongue every 5 (five) minutes  as needed for chest pain. 25 tablet 3   No current facility-administered medications on file prior to visit.    BP 120/80   Pulse 61   Temp 98 F (36.7 C) (Oral)   Wt 177 lb (80.3 kg)   SpO2 95%   BMI 25.40 kg/m       Objective:   Physical Exam Vitals and nursing note reviewed.  Constitutional:      Appearance: Normal appearance.  Cardiovascular:     Rate and Rhythm: Normal rate and regular rhythm.     Pulses: Normal pulses.     Heart sounds: Normal heart sounds.  Pulmonary:     Effort: Pulmonary effort is normal.     Breath sounds: Normal breath sounds.  Musculoskeletal:        General: Normal range of motion.     Right upper arm: Normal.     Right elbow: No deformity. Normal range of motion. Tenderness present in lateral epicondyle and olecranon process.     Right forearm: Swelling and tenderness present. No deformity or bony tenderness.     Right wrist: Normal.     Right hand: Normal.     Right lower leg: 1+ Pitting Edema present.     Left lower leg: 1+ Pitting Edema present.  Skin:    General: Skin is warm and dry.     Findings: Bruising present.  Neurological:     General: No focal deficit present.     Mental Status: She is alert and oriented to person, place, and time.  Psychiatric:        Mood and Affect: Mood normal.        Behavior: Behavior normal.        Thought Content: Thought content normal.        Judgment: Judgment normal.        Assessment & Plan:  1. Lower extremity edema (Primary) -much improved.  Will check BMP today.  Encouraged to elevate legs.  Will likely keep her on Lasix 20 mg daily - Basic Metabolic Panel; Future 2. Essential hypertension - At goal   3. Gait instability -Likely due to lumbar radiculopathy.  Will order her four-wheel rolling walker with seat to help prevent falls.  She was encouraged to pull up all throw rugs in her house  - For home use only DME 4 wheeled rolling walker with seat ([id number])  4. Cervical  radiculopathy -Awaiting x-ray results and likely order advanced imaging.  Will provide a short course of tramadol to severe pain.  She was encouraged to take this as needed. - traMADol (ULTRAM) 50 MG tablet; Take 1 tablet (50 mg total) by mouth every 6 (six) hours as needed for up to 7 days.  Dispense: 28 tablet; Refill: 0  5. Thoracic spine pain  - traMADol (ULTRAM) 50 MG tablet; Take 1 tablet (50 mg total) by mouth every 6 (six) hours as needed for up to 7 days.  Dispense: 28 tablet; Refill: 0  6. Lumbar radiculopathy  - traMADol (ULTRAM) 50 MG tablet; Take 1 tablet (50 mg total) by mouth every 6 (six) hours as needed for up to 7 days.  Dispense: 28 tablet; Refill: 0  7. Right arm pain -I offered x-ray her right arm but  she does not want to wait another 2 weeks to get imaging report back which I completely understand.  She does have full range of motion and good grip strength but does have some soft tissue swelling and a bruise on the side of her right arm. Encouraged ice  Shirline Frees, NP

## 2023-04-29 ENCOUNTER — Other Ambulatory Visit: Payer: Self-pay | Admitting: Adult Health

## 2023-04-29 MED ORDER — AMOXICILLIN-POT CLAVULANATE 875-125 MG PO TABS: 1.0000 | ORAL_TABLET | Freq: Two times a day (BID) | ORAL | 0 refills | Status: DC

## 2023-04-29 MED ORDER — PREDNISONE 10 MG PO TABS: ORAL_TABLET | ORAL | 0 refills | Status: DC

## 2023-05-01 ENCOUNTER — Other Ambulatory Visit: Payer: Self-pay | Admitting: Adult Health

## 2023-05-01 DIAGNOSIS — S22030A Wedge compression fracture of third thoracic vertebra, initial encounter for closed fracture: Secondary | ICD-10-CM

## 2023-05-01 DIAGNOSIS — S32030A Wedge compression fracture of third lumbar vertebra, initial encounter for closed fracture: Secondary | ICD-10-CM

## 2023-05-01 DIAGNOSIS — M5416 Radiculopathy, lumbar region: Secondary | ICD-10-CM

## 2023-05-01 DIAGNOSIS — M542 Cervicalgia: Secondary | ICD-10-CM

## 2023-05-06 ENCOUNTER — Ambulatory Visit (INDEPENDENT_AMBULATORY_CARE_PROVIDER_SITE_OTHER): Payer: Medicare Other | Admitting: "Endocrinology

## 2023-05-06 ENCOUNTER — Encounter: Payer: Self-pay | Admitting: "Endocrinology

## 2023-05-06 VITALS — BP 142/80 | HR 84 | Ht 70.0 in | Wt 174.0 lb

## 2023-05-06 DIAGNOSIS — E041 Nontoxic single thyroid nodule: Secondary | ICD-10-CM | POA: Diagnosis not present

## 2023-05-06 DIAGNOSIS — F17200 Nicotine dependence, unspecified, uncomplicated: Secondary | ICD-10-CM

## 2023-05-06 DIAGNOSIS — E0500 Thyrotoxicosis with diffuse goiter without thyrotoxic crisis or storm: Secondary | ICD-10-CM | POA: Diagnosis not present

## 2023-05-06 NOTE — Progress Notes (Signed)
 Outpatient Endocrinology Note [place], [place]  05/06/23   Laurie [name] 1956/01/30 [id number]  Referring Provider: Shirline Frees, NP Primary Care Provider: Shirline Frees, NP Subjective  No chief complaint on file.   Assessment & Plan  Diagnoses and all orders for this visit:  Graves disease -     TSH -     T4, free -     T3, free -     TRAb (TSH Receptor Binding Antibody) -     Thyroid stimulating immunoglobulin  Uninodular goiter  Smoking   Laurie [name] is currently taking methimazole 20 mg qd. Educated on thyroid axis.  Discussed the etiology for hyperthyroidism. Educated on thyroid axis.  Recommend the following: Repeat labs today.  Repeat labs sooner if symptoms of hyper or hypothyroidism develop.  Pt previously decided on medication rather than RAI therapy and surgery.  Counseled on: -complications of untreated hyperthyroidism including atrial fibrillation, heart failure and osteoporosis -side effects of Methimazole including but not limited to allergic reaction, rash, bone marrow suppression, liver dysfunction -compliance and follow up needs    If you notice any symptoms of worsening fatigue, fever with sore throat, loss of appetite, yellowing of eyes, dark urine, joint pains, sores in the mouth, itchy rash, light colored stools or abdominal pain, please stop the medication and call us immediately as this can be a serious side effect of the medication.  The substances in cigarettes can affect the autoimmune processes underlying Graves' disease Encouraged quitting and contacting PCP for any help needed such as patches or pills to curb nicotine dependence   Pt understands and agreed  Ordered eye referral to assess eyes (watering, pain, burning, sand/gritty sensation and swelling under eyes), has appointment in July 2024  Ordered thyroid ultrasound to assess for thyromegaly report in CT angio neck done in 2023. Pt open to FNA if needed. Thyroid  ultrasound revealed left lobe thyromegaly due to TR3 inferior left thyroid lobe measuring 4.9 x 4.6 x 3.0 cm. Discussed, patient wants to proceed with FNA, ordered FNA. Did not do. Ordered again. 12/2022: - Benign follicular nodule (Bethesda category II)  Repeat thyroid U/S in 12/2023  I have reviewed current medications, nurse's notes, allergies, vital signs, past medical and surgical history, family medical history, and social history for this encounter. Counseled patient on symptoms, examination findings, lab findings, imaging results, treatment decisions and monitoring and prognosis. The patient understood the recommendations and agrees with the treatment plan. All questions regarding treatment plan were fully answered.   Return in about 3 months (around 08/06/2023) for visit + labs before next visit, labs today.   [place], MD  05/06/23   I have reviewed current medications, nurse's notes, allergies, vital signs, past medical and surgical history, family medical history, and social history for this encounter. Counseled patient on symptoms, examination findings, lab findings, imaging results, treatment decisions and monitoring and prognosis. The patient understood the recommendations and agrees with the treatment plan. All questions regarding treatment plan were fully answered.   History of Present Illness Laurie [name] is a 68 y.o. year old female who presents to our clinic with hyperthyroidism diagnosed in 2021.    Om methimazole 20 mg every day   Symptoms suggestive of HYPERTHYROIDISM:  Weight gain/ loss  yes, some weight gain  Cold/heat intolerance No Constipation/ hyperdefecation  No palpitations  No  Compressive symptoms:  dysphagia  Yes, sometimes, history of esophageal dilation thrice dysphonia  Yes positional dyspnea (especially with  simultaneous arms elevation)  No  Smokes  Yes On biotin  No Personal history of head/neck surgery/irradiation-neck surgery for  ruptured disc   Adverse Drug Effects from Methimazole (MMI): rash No fever No throat pain No arthritis No mouth ulcers No jaundice No loss of appetite No lymphadenopathy No   Grave's Ophthalmopathy Clinical Activity Score: 2/9, feels burning and gritty sensation in eyes  03/2021 CT ANGIOGRAPHY NECK  Chronically enlarged and heterogeneous thyroid (series 7, image 91).  07/26/22 Thyroid ultrasound reviewed images and report independently  Left lobe: 7.3 x 3.1 x 2.6 cm  Nodule labeled 1 is a large solid isoechoic TR 3 nodule occupying a majority of and expanding the inferior left thyroid lobe measuring 4.9 x 4.6 x 3.0 cm. **Given size (>/= 2.5 cm) and appearance, fine needle aspiration of this mildly suspicious nodule should be considered based on TI-RADS criteria.  Physical Exam  BP (!) 142/80   Pulse 84   Ht 5\' 10"  (1.778 m)   Wt 174 lb (78.9 kg)   SpO2 96%   BMI 24.97 kg/m  Constitutional: well developed, well nourished Head: normocephalic, atraumatic, no exophthalmos Eyes: sclera anicteric, no redness Neck: + thyromegaly, no thyroid tenderness; no nodules palpated Lungs: normal respiratory effort Neurology: alert and oriented Skin: dry, no appreciable rashes Musculoskeletal: no appreciable defects, + B/L fine hand tremor Psychiatric: normal mood and affect  Allergies Allergies  Allergen Reactions   Codeine Nausea And Vomiting   Lisinopril Cough   Sulfonamide Derivatives Nausea And Vomiting   Tetracyclines & Related Rash    Current Medications Patient's Medications  New Prescriptions   No medications on file  Previous Medications   ALBUTEROL (VENTOLIN HFA) 108 (90 BASE) MCG/ACT INHALER    INHALE 2 PUFFS BY MOUTH EVERY 6 HOURS AS NEEDED FOR SHORTNESS OF BREATH AND WHEEZING   AMOXICILLIN-CLAVULANATE (AUGMENTIN) 875-125 MG TABLET    Take 1 tablet by mouth 2 (two) times daily.   ASPIRIN EC 81 MG TABLET    Take 1 tablet (81 mg total) by mouth daily. Swallow  whole.   ATORVASTATIN (LIPITOR) 40 MG TABLET    TAKE 1 TABLET BY MOUTH EVERY EVENING   CLOPIDOGREL (PLAVIX) 75 MG TABLET    Take 1 tablet (75 mg total) by mouth daily.   FENOFIBRATE (TRICOR) 145 MG TABLET    TAKE 1 TABLET BY MOUTH EVERY EVENING   FLUTICASONE (FLONASE) 50 MCG/ACT NASAL SPRAY    INSTILL TWO (2) SPRAYS IN EACH NOSTRIL DAILY   FUROSEMIDE (LASIX) 20 MG TABLET    TAKE ONE TABLET BY MOUTH ONCE Daily   LOSARTAN (COZAAR) 100 MG TABLET    Take 100 mg by mouth daily.   MECLIZINE (ANTIVERT) 25 MG TABLET    Take 1 tablet (25 mg total) by mouth 3 (three) times daily as needed for dizziness.   METHADONE (DOLOPHINE) 10 MG/ML SOLUTION    Take 80 mg by mouth daily.   METHIMAZOLE (TAPAZOLE) 10 MG TABLET    TAKE 1 TABLET BY MOUTH EACH EVENING   METOPROLOL SUCCINATE (TOPROL-XL) 50 MG 24 HR TABLET    TAKE 1 TABLET BY MOUTH EVERY MORNING WITH OR IMMEDIATELY FOLLOWING A MEAL   NITROGLYCERIN (NITROSTAT) 0.4 MG SL TABLET    Place 1 tablet (0.4 mg total) under the tongue every 5 (five) minutes as needed for chest pain.   ONDANSETRON (ZOFRAN) 4 MG TABLET    Take 1 tablet (4 mg total) by mouth every 6 (six) hours.   ONDANSETRON (ZOFRAN-ODT)  8 MG DISINTEGRATING TABLET    PLACE 1 TABLET ON TONGUE AND ALLOW TO DISSOLVE EVERY 8 HOURS AS NEEDED FOR NAUSEA & VOMITING   PANTOPRAZOLE (PROTONIX) 40 MG TABLET    TAKE 1 TABLET BY MOUTH EVERY MORNING   PREDNISONE (DELTASONE) 10 MG TABLET    40 mg x 3 days, 20 mg x 3 days, 10 mg x 3 days   VENLAFAXINE XR (EFFEXOR-XR) 150 MG 24 HR CAPSULE    TAKE 1 CAPSULE BY MOUTH EVERY MORNING  Modified Medications   No medications on file  Discontinued Medications   No medications on file    Past Medical History Past Medical History:  Diagnosis Date   Allergy    Anxiety    Arthritis    RA   Asthma    Chronic back pain    COPD (chronic obstructive pulmonary disease) (HCC)    Depression    Esophageal stricture    GERD (gastroesophageal reflux disease)    Heart murmur     Hematemesis 09/10/2018   Hyperlipidemia    Hypertension    IBS (irritable bowel syndrome)    Interstitial cystitis    Neuromuscular disorder (HCC)    fibromyalgia   PONV (postoperative nausea and vomiting)     Past Surgical History Past Surgical History:  Procedure Laterality Date   ABDOMINAL HYSTERECTOMY     APPENDECTOMY     BIOPSY  09/11/2018   Procedure: BIOPSY;  Surgeon: Meridee Score, Netty Starring., MD;  Location: [facility] ENDOSCOPY;  Service: Gastroenterology;;   CERVICAL FUSION     CERVICAL LAMINECTOMY     CORONARY STENT INTERVENTION N/A 10/07/2019   Procedure: CORONARY STENT INTERVENTION;  Surgeon: Kathleene Hazel, MD;  Location: [facility] INVASIVE CV LAB;  Service: Cardiovascular;  Laterality: N/A;   ESOPHAGOGASTRODUODENOSCOPY     ESOPHAGOGASTRODUODENOSCOPY (EGD) WITH PROPOFOL N/A 09/11/2018   Procedure: ESOPHAGOGASTRODUODENOSCOPY (EGD) WITH PROPOFOL;  Surgeon: Meridee Score Netty Starring., MD;  Location: [facility] ENDOSCOPY;  Service: Gastroenterology;  Laterality: N/A;   FINGER SURGERY     KYPHOPLASTY N/A 03/29/2021   Procedure: Lumbar Three KYPHOPLASTY;  Surgeon: Barnett Abu, MD;  Location: [facility] OR;  Service: Neurosurgery;  Laterality: N/A;   LEFT HEART CATH AND CORONARY ANGIOGRAPHY N/A 10/07/2019   Procedure: LEFT HEART CATH AND CORONARY ANGIOGRAPHY;  Surgeon: Kathleene Hazel, MD;  Location: [facility] INVASIVE CV LAB;  Service: Cardiovascular;  Laterality: N/A;   LEFT HEART CATH AND CORONARY ANGIOGRAPHY N/A 06/20/2022   Procedure: LEFT HEART CATH AND CORONARY ANGIOGRAPHY;  Surgeon: Kathleene Hazel, MD;  Location: [facility] INVASIVE CV LAB;  Service: Cardiovascular;  Laterality: N/A;   LUMBAR FUSION     SAVORY DILATION N/A 09/11/2018   Procedure: SAVORY DILATION;  Surgeon: Meridee Score Netty Starring., MD;  Location: [facility] ENDOSCOPY;  Service: Gastroenterology;  Laterality: N/A;   TONSILLECTOMY      Family History family history includes Aneurysm in her brother; Cancer in her brother; Coronary artery  disease in her father, paternal aunt, and paternal grandmother; Dementia in her mother; Esophageal cancer in her brother; Heart attack in her father; Stomach cancer in her paternal aunt.  Social History Social History   Socioeconomic History   Marital status: Single    Spouse name: Not on file   Number of children: Not on file   Years of education: Not on file   Highest education level: Not on file  Occupational History   Occupation: disability  Tobacco Use   Smoking status: Every Day    Current packs/day: 0.50  Types: Cigarettes   Smokeless tobacco: Never   Tobacco comments:    trying to quit   Vaping Use   Vaping status: Never Used  Substance and Sexual Activity   Alcohol use: Never    Comment: occasional   Drug use: Yes    Types: Marijuana    Comment: occas   Sexual activity: Not on file  Other Topics Concern   Not on file  Social History Narrative   Not on file   Social Drivers of Health   Financial Resource Strain: Low Risk  (06/17/2022)   Overall Financial Resource Strain (CARDIA)    Difficulty of Paying Living Expenses: Not hard at all  Food Insecurity: No Food Insecurity (07/15/2022)   Hunger Vital Sign    Worried About Running Out of Food in the Last Year: Never true    Ran Out of Food in the Last Year: Never true  Transportation Needs: No Transportation Needs (06/17/2022)   PRAPARE - Administrator, Civil Service (Medical): No    Lack of Transportation (Non-Medical): No  Physical Activity: Sufficiently Active (06/17/2022)   Exercise Vital Sign    Days of Exercise per Week: 7 days    Minutes of Exercise per Session: 30 min  Stress: No Stress Concern Present (06/17/2022)   [employer] of Occupational Health - Occupational Stress Questionnaire    Feeling of Stress : Not at all  Social Connections: Socially Integrated (06/17/2022)   Social Connection and Isolation Panel [NHANES]    Frequency of Communication with Friends and Family: More than three  times a week    Frequency of Social Gatherings with Friends and Family: More than three times a week    Attends Religious Services: More than 4 times per year    Active Member of Clubs or Organizations: Yes    Attends Banker Meetings: More than 4 times per year    Marital Status: Living with partner  Intimate Partner Violence: Not At Risk (06/17/2022)   Humiliation, Afraid, Rape, and Kick questionnaire    Fear of Current or Ex-Partner: No    Emotionally Abused: No    Physically Abused: No    Sexually Abused: No    Laboratory Investigations Lab Results  Component Value Date   TSH 3.46 02/03/2023   TSH 1.86 11/05/2022   TSH 0.75 10/01/2022   FREET4 1.0 02/03/2023   FREET4 1.05 11/05/2022   FREET4 1.24 10/01/2022     Lab Results  Component Value Date   TSI 442 (H) 07/25/2022     No components found for: "TRAB"   Lab Results  Component Value Date   CHOL 120 02/13/2022   Lab Results  Component Value Date   HDL 43.50 02/13/2022   Lab Results  Component Value Date   LDLCALC 59 02/13/2022   Lab Results  Component Value Date   TRIG 88.0 02/13/2022   Lab Results  Component Value Date   CHOLHDL 3 02/13/2022   Lab Results  Component Value Date   CREATININE 0.84 04/25/2023   Lab Results  Component Value Date   GFR 71.40 04/25/2023      Component Value Date/Time   NA 138 04/25/2023 1436   NA 142 06/10/2022 1559   K 4.5 04/25/2023 1436   CL 101 04/25/2023 1436   CO2 31 04/25/2023 1436   GLUCOSE 100 (H) 04/25/2023 1436   BUN 14 04/25/2023 1436   BUN 22 06/10/2022 1559   CREATININE 0.84 04/25/2023 1436  CALCIUM 9.1 04/25/2023 1436   PROT 6.9 12/09/2022 1625   PROT 6.7 10/08/2021 0000   ALBUMIN 3.9 12/09/2022 1625   ALBUMIN 4.3 10/08/2021 0000   AST 13 12/09/2022 1625   ALT 11 12/09/2022 1625   ALKPHOS 57 12/09/2022 1625   BILITOT 0.4 12/09/2022 1625   BILITOT <0.2 10/08/2021 0000   GFRNONAA 60 (L) 01/29/2023 1124   GFRAA 76 09/27/2019 1026       Latest Ref Rng & Units 04/25/2023    2:36 PM 01/30/2023   12:34 AM 01/29/2023   11:24 AM  BMP  Glucose 70 - 99 mg/dL 161  88  096   BUN 6 - 23 mg/dL 14  14  16    Creatinine 0.40 - 1.20 mg/dL 0.45  4.09  8.11   Sodium 135 - 145 mEq/L 138  139  141   Potassium 3.5 - 5.1 mEq/L 4.5  3.7  3.8   Chloride 96 - 112 mEq/L 101  102  103   CO2 19 - 32 mEq/L 31   28   Calcium 8.4 - 10.5 mg/dL 9.1   9.4        Component Value Date/Time   WBC 8.3 01/30/2023 0025   RBC 4.37 01/30/2023 0025   HGB 12.6 01/30/2023 0034   HGB 12.4 06/10/2022 1559   HCT 37.0 01/30/2023 0034   HCT 38.1 06/10/2022 1559   PLT 245 01/30/2023 0025   PLT 316 06/10/2022 1559   MCV 89.2 01/30/2023 0025   MCV 86 06/10/2022 1559   MCH 28.4 01/30/2023 0025   MCHC 31.8 01/30/2023 0025   RDW 13.1 01/30/2023 0025   RDW 13.0 06/10/2022 1559   LYMPHSABS 2.3 01/30/2023 0025   LYMPHSABS 2.7 09/27/2019 1026   MONOABS 0.6 01/30/2023 0025   EOSABS 0.4 01/30/2023 0025   EOSABS 0.5 (H) 09/27/2019 1026   BASOSABS 0.1 01/30/2023 0025   BASOSABS 0.1 09/27/2019 1026      Parts of this note may have been dictated using voice recognition software. There may be variances in spelling and vocabulary which are unintentional. Not all errors are proofread. Please notify the Thereasa Parkin if any discrepancies are noted or if the meaning of any statement is not clear.

## 2023-05-06 NOTE — Patient Instructions (Signed)
  If you notice any symptoms of worsening fatigue, fever with sore throat, loss of appetite, yellowing of eyes, dark urine, joint pains, sores in the mouth, itchy rash, light colored stools or abdominal pain, please stop the medication and call us immediately as this can be a serious side effect of the medication.

## 2023-05-07 ENCOUNTER — Telehealth: Payer: Self-pay

## 2023-05-07 NOTE — Telephone Encounter (Signed)
 Fax has been sent to [facility]

## 2023-05-07 NOTE — Telephone Encounter (Signed)
 Copied from CRM [id number]. Topic: Clinical - Prescription Issue >> May 07, 2023 11:23 AM Ernst Spell wrote: Reason for CRM: Crystal from [facility] called and stated that they received an order for a wheeled walker,but the document is missing signature from pcp,. Please re-fax to them at [phone].

## 2023-05-08 ENCOUNTER — Other Ambulatory Visit: Payer: Self-pay | Admitting: "Endocrinology

## 2023-05-08 DIAGNOSIS — E0500 Thyrotoxicosis with diffuse goiter without thyrotoxic crisis or storm: Secondary | ICD-10-CM

## 2023-05-08 MED ORDER — METHIMAZOLE 10 MG PO TABS: 10.0000 mg | ORAL_TABLET | Freq: Two times a day (BID) | ORAL | 1 refills | Status: AC

## 2023-05-09 LAB — T4, FREE: Free T4: 1.6 ng/dL (ref 0.8–1.8)

## 2023-05-09 LAB — T3, FREE: T3, Free: 3.5 pg/mL (ref 2.3–4.2)

## 2023-05-09 LAB — TSH: TSH: 0.21 m[IU]/L — ABNORMAL LOW (ref 0.40–4.50)

## 2023-05-09 LAB — TRAB (TSH RECEPTOR BINDING ANTIBODY): TRAB: 5.06 IU/L — ABNORMAL HIGH (ref <=2.00)

## 2023-05-09 LAB — THYROID STIMULATING IMMUNOGLOBULIN: TSI: 400 %{baseline} — ABNORMAL HIGH (ref <140)

## 2023-05-12 ENCOUNTER — Encounter (HOSPITAL_COMMUNITY): Payer: Self-pay | Admitting: Emergency Medicine

## 2023-05-12 ENCOUNTER — Emergency Department (HOSPITAL_COMMUNITY)

## 2023-05-12 ENCOUNTER — Other Ambulatory Visit

## 2023-05-12 ENCOUNTER — Emergency Department (HOSPITAL_COMMUNITY)
Admission: EM | Admit: 2023-05-12 | Discharge: 2023-05-13 | Disposition: A | Discharge status: Home / Self Care | Attending: Emergency Medicine | Admitting: Emergency Medicine

## 2023-05-12 ENCOUNTER — Other Ambulatory Visit: Payer: Self-pay

## 2023-05-12 DIAGNOSIS — S8991XA Unspecified injury of right lower leg, initial encounter: Secondary | ICD-10-CM | POA: Diagnosis not present

## 2023-05-12 DIAGNOSIS — J449 Chronic obstructive pulmonary disease, unspecified: Secondary | ICD-10-CM | POA: Insufficient documentation

## 2023-05-12 DIAGNOSIS — I672 Cerebral atherosclerosis: Secondary | ICD-10-CM | POA: Insufficient documentation

## 2023-05-12 DIAGNOSIS — S8992XA Unspecified injury of left lower leg, initial encounter: Secondary | ICD-10-CM | POA: Diagnosis not present

## 2023-05-12 DIAGNOSIS — S62616A Displaced fracture of proximal phalanx of right little finger, initial encounter for closed fracture: Secondary | ICD-10-CM | POA: Insufficient documentation

## 2023-05-12 DIAGNOSIS — R600 Localized edema: Secondary | ICD-10-CM | POA: Diagnosis not present

## 2023-05-12 DIAGNOSIS — S3991XA Unspecified injury of abdomen, initial encounter: Secondary | ICD-10-CM | POA: Diagnosis not present

## 2023-05-12 DIAGNOSIS — W1839XA Other fall on same level, initial encounter: Secondary | ICD-10-CM | POA: Insufficient documentation

## 2023-05-12 DIAGNOSIS — I2510 Atherosclerotic heart disease of native coronary artery without angina pectoris: Secondary | ICD-10-CM | POA: Diagnosis not present

## 2023-05-12 DIAGNOSIS — I6529 Occlusion and stenosis of unspecified carotid artery: Secondary | ICD-10-CM | POA: Diagnosis not present

## 2023-05-12 DIAGNOSIS — S299XXA Unspecified injury of thorax, initial encounter: Secondary | ICD-10-CM | POA: Diagnosis not present

## 2023-05-12 DIAGNOSIS — R0789 Other chest pain: Secondary | ICD-10-CM | POA: Diagnosis not present

## 2023-05-12 DIAGNOSIS — I517 Cardiomegaly: Secondary | ICD-10-CM | POA: Diagnosis not present

## 2023-05-12 DIAGNOSIS — S0990XA Unspecified injury of head, initial encounter: Secondary | ICD-10-CM | POA: Diagnosis not present

## 2023-05-12 DIAGNOSIS — R079 Chest pain, unspecified: Secondary | ICD-10-CM | POA: Diagnosis not present

## 2023-05-12 DIAGNOSIS — S59901A Unspecified injury of right elbow, initial encounter: Secondary | ICD-10-CM | POA: Diagnosis not present

## 2023-05-12 DIAGNOSIS — I700 Atherosclerosis of aorta: Secondary | ICD-10-CM | POA: Insufficient documentation

## 2023-05-12 DIAGNOSIS — R296 Repeated falls: Secondary | ICD-10-CM

## 2023-05-12 DIAGNOSIS — S62619A Displaced fracture of proximal phalanx of unspecified finger, initial encounter for closed fracture: Secondary | ICD-10-CM

## 2023-05-12 DIAGNOSIS — Z043 Encounter for examination and observation following other accident: Secondary | ICD-10-CM | POA: Diagnosis not present

## 2023-05-12 DIAGNOSIS — M79631 Pain in right forearm: Secondary | ICD-10-CM | POA: Insufficient documentation

## 2023-05-12 DIAGNOSIS — Z981 Arthrodesis status: Secondary | ICD-10-CM | POA: Diagnosis not present

## 2023-05-12 DIAGNOSIS — S62614A Displaced fracture of proximal phalanx of right ring finger, initial encounter for closed fracture: Secondary | ICD-10-CM | POA: Diagnosis not present

## 2023-05-12 DIAGNOSIS — S22070A Wedge compression fracture of T9-T10 vertebra, initial encounter for closed fracture: Secondary | ICD-10-CM | POA: Insufficient documentation

## 2023-05-12 DIAGNOSIS — S3993XA Unspecified injury of pelvis, initial encounter: Secondary | ICD-10-CM | POA: Diagnosis not present

## 2023-05-12 DIAGNOSIS — M7989 Other specified soft tissue disorders: Secondary | ICD-10-CM | POA: Diagnosis not present

## 2023-05-12 DIAGNOSIS — J45909 Unspecified asthma, uncomplicated: Secondary | ICD-10-CM | POA: Diagnosis not present

## 2023-05-12 DIAGNOSIS — S59902A Unspecified injury of left elbow, initial encounter: Secondary | ICD-10-CM | POA: Diagnosis not present

## 2023-05-12 LAB — CBC
HCT: 35.9 % — ABNORMAL LOW (ref 36.0–46.0)
Hemoglobin: 10.8 g/dL — ABNORMAL LOW (ref 12.0–15.0)
MCH: 26.9 pg (ref 26.0–34.0)
MCHC: 30.1 g/dL (ref 30.0–36.0)
MCV: 89.3 fL (ref 80.0–100.0)
Platelets: 278 10*3/uL (ref 150–400)
RBC: 4.02 MIL/uL (ref 3.87–5.11)
RDW: 13.7 % (ref 11.5–15.5)
WBC: 9.3 10*3/uL (ref 4.0–10.5)
nRBC: 0 % (ref 0.0–0.2)

## 2023-05-12 LAB — CBG MONITORING, ED: Glucose-Capillary: 126 mg/dL — ABNORMAL HIGH (ref 70–99)

## 2023-05-12 MED ORDER — AMOXICILLIN-POT CLAVULANATE 875-125 MG PO TABS
1.0000 | ORAL_TABLET | Freq: Two times a day (BID) | ORAL | Status: DC
  Administered 2023-05-13: 1 via ORAL
  Filled 2023-05-12: qty 1

## 2023-05-12 MED ORDER — METHADONE HCL 10 MG PO TABS
80.0000 mg | ORAL_TABLET | Freq: Once | ORAL | Status: AC
  Administered 2023-05-13: 80 mg via ORAL
  Filled 2023-05-12: qty 8

## 2023-05-12 MED ORDER — ACETAMINOPHEN 500 MG PO TABS
1000.0000 mg | ORAL_TABLET | Freq: Once | ORAL | Status: AC
  Administered 2023-05-13: 1000 mg via ORAL
  Filled 2023-05-12: qty 2

## 2023-05-12 NOTE — ED Triage Notes (Signed)
 Pt to ED from home c/o mid chest pain yesterday that is heavy.  Right arm is swollen and bilateral legs swollen as well.  States has been falling multiple times a day recently because her legs give out and unsure why she's falling.  Has hit head with some of the falls, denies LOC.  States was taking blood thinners but unsure that she's had it recently.

## 2023-05-13 ENCOUNTER — Emergency Department (HOSPITAL_COMMUNITY)

## 2023-05-13 DIAGNOSIS — S8992XA Unspecified injury of left lower leg, initial encounter: Secondary | ICD-10-CM | POA: Diagnosis not present

## 2023-05-13 DIAGNOSIS — I517 Cardiomegaly: Secondary | ICD-10-CM | POA: Diagnosis not present

## 2023-05-13 DIAGNOSIS — S59901A Unspecified injury of right elbow, initial encounter: Secondary | ICD-10-CM | POA: Diagnosis not present

## 2023-05-13 DIAGNOSIS — S299XXA Unspecified injury of thorax, initial encounter: Secondary | ICD-10-CM | POA: Diagnosis not present

## 2023-05-13 DIAGNOSIS — Z043 Encounter for examination and observation following other accident: Secondary | ICD-10-CM | POA: Diagnosis not present

## 2023-05-13 DIAGNOSIS — I6529 Occlusion and stenosis of unspecified carotid artery: Secondary | ICD-10-CM | POA: Diagnosis not present

## 2023-05-13 DIAGNOSIS — Z981 Arthrodesis status: Secondary | ICD-10-CM | POA: Diagnosis not present

## 2023-05-13 DIAGNOSIS — S59902A Unspecified injury of left elbow, initial encounter: Secondary | ICD-10-CM | POA: Diagnosis not present

## 2023-05-13 DIAGNOSIS — I672 Cerebral atherosclerosis: Secondary | ICD-10-CM | POA: Diagnosis not present

## 2023-05-13 DIAGNOSIS — S0990XA Unspecified injury of head, initial encounter: Secondary | ICD-10-CM | POA: Diagnosis not present

## 2023-05-13 DIAGNOSIS — S3991XA Unspecified injury of abdomen, initial encounter: Secondary | ICD-10-CM | POA: Diagnosis not present

## 2023-05-13 DIAGNOSIS — S8991XA Unspecified injury of right lower leg, initial encounter: Secondary | ICD-10-CM | POA: Diagnosis not present

## 2023-05-13 DIAGNOSIS — S3993XA Unspecified injury of pelvis, initial encounter: Secondary | ICD-10-CM | POA: Diagnosis not present

## 2023-05-13 DIAGNOSIS — R079 Chest pain, unspecified: Secondary | ICD-10-CM | POA: Diagnosis not present

## 2023-05-13 LAB — I-STAT CHEM 8, ED
BUN: 17 mg/dL (ref 8–23)
Calcium, Ion: 1.13 mmol/L — ABNORMAL LOW (ref 1.15–1.40)
Chloride: 101 mmol/L (ref 98–111)
Creatinine, Ser: 1.2 mg/dL — ABNORMAL HIGH (ref 0.44–1.00)
Glucose, Bld: 118 mg/dL — ABNORMAL HIGH (ref 70–99)
HCT: 26 % — ABNORMAL LOW (ref 36.0–46.0)
Hemoglobin: 8.8 g/dL — ABNORMAL LOW (ref 12.0–15.0)
Potassium: 3.4 mmol/L — ABNORMAL LOW (ref 3.5–5.1)
Sodium: 138 mmol/L (ref 135–145)
TCO2: 27 mmol/L (ref 22–32)

## 2023-05-13 LAB — BASIC METABOLIC PANEL WITH GFR
Anion gap: 7 (ref 5–15)
BUN: 19 mg/dL (ref 8–23)
CO2: 29 mmol/L (ref 22–32)
Calcium: 8.8 mg/dL — ABNORMAL LOW (ref 8.9–10.3)
Chloride: 98 mmol/L (ref 98–111)
Creatinine, Ser: 1.05 mg/dL — ABNORMAL HIGH (ref 0.44–1.00)
GFR, Estimated: 58 mL/min — ABNORMAL LOW (ref >60)
Glucose, Bld: 139 mg/dL — ABNORMAL HIGH (ref 70–99)
Potassium: 3.4 mmol/L — ABNORMAL LOW (ref 3.5–5.1)
Sodium: 134 mmol/L — ABNORMAL LOW (ref 135–145)

## 2023-05-13 LAB — BRAIN NATRIURETIC PEPTIDE: B Natriuretic Peptide: 90.3 pg/mL (ref 0.0–100.0)

## 2023-05-13 LAB — TROPONIN I (HIGH SENSITIVITY)
Troponin I (High Sensitivity): 6 ng/L (ref <18)
Troponin I (High Sensitivity): 6 ng/L (ref <18)

## 2023-05-13 MED ORDER — IOHEXOL 300 MG/ML  SOLN
100.0000 mL | Freq: Once | INTRAMUSCULAR | Status: AC | PRN
  Administered 2023-05-13: 100 mL via INTRAVENOUS

## 2023-05-13 NOTE — Progress Notes (Signed)
 Orthopedic Tech Progress Note Patient Details:  Laurie [name] [birth year] [id number]  Ortho Devices Type of Ortho Device: Ulna gutter splint Ortho Device/Splint Location: rue Ortho Device/Splint Interventions: Ordered, Application, Adjustment  I spoke with the dr before seeing the patient so make sure that we were just immobilizing the finger, they said that was right. Post Interventions Patient Tolerated: Well Instructions Provided: Care of device, Adjustment of device  Trinna Post 05/13/2023, 4:29 AM

## 2023-05-13 NOTE — ED Provider Notes (Signed)
 [place] EMERGENCY DEPARTMENT AT [facility] Provider Note  CSN: [id number] Arrival date & time: 05/12/23 2313  Chief Complaint(s) Chest Pain  HPI Laurie [name] is a 68 y.o. female {Add pertinent medical, surgical, social history, OB history to HPI:1}    Chest Pain   Past Medical History Past Medical History:  Diagnosis Date   Allergy    Anxiety    Arthritis    RA   Asthma    Chronic back pain    COPD (chronic obstructive pulmonary disease) (HCC)    Depression    Esophageal stricture    GERD (gastroesophageal reflux disease)    Heart murmur    Hematemesis 09/10/2018   Hyperlipidemia    Hypertension    IBS (irritable bowel syndrome)    Interstitial cystitis    Neuromuscular disorder (HCC)    fibromyalgia   PONV (postoperative nausea and vomiting)    Patient Active Problem List   Diagnosis Date Noted   Fracture, lumbar vertebra, compression (HCC) 03/29/2021   Eye swelling 04/27/2020   Hyperthyroidism 02/19/2020   Coronary artery disease involving native coronary artery of native heart with unstable angina pectoris (HCC)    Substance abuse (HCC) 07/23/2019   Renal insufficiency 09/10/2018   Elevated troponin 09/10/2018   Leukocytosis 09/10/2018   Hematemesis 09/09/2018   Opiate withdrawal (HCC) 09/09/2018   Vitamin D deficiency 06/10/2018   Shortness of breath 11/05/2017   Elevated brain natriuretic peptide (BNP) level 11/05/2017   Polysubstance (excluding opioids) dependence, daily use (HCC) 11/05/2017   AKI (acute kidney injury) (HCC) 11/05/2017   Low serum vitamin D 08/23/2015   Hyperlipidemia 11/08/2014   Reflux esophagitis 03/23/2014   Dysphagia, pharyngoesophageal phase 03/23/2014   Pain in the chest 03/23/2014   Special screening for malignant neoplasms, colon 07/09/2013   Chest pain 04/23/2012   Dyspnea 04/23/2012   Murmur 04/23/2012   INTERSTITIAL CYSTITIS 01/01/2010   SWELLING, NECK 01/01/2010   CAD, NATIVE VESSEL 12/19/2009    Palpitations 11/21/2009   ABNORMAL CV (STRESS) TEST 11/21/2009   Essential hypertension 10/31/2009   ELECTROCARDIOGRAM, ABNORMAL 10/25/2009   PLANTAR FASCIITIS, BILATERAL 10/12/2009   CERVICAL RADICULOPATHY 07/04/2008   Pain in soft tissues of limb 03/25/2008   DEGENERATIVE DISC DISEASE, CERVICAL SPINE, W/RADICULOPATHY 09/08/2007   LOW BACK PAIN 09/08/2007   TOBACCO USE 03/23/2007   OSTEOARTHROSIS, LOCAL NOS, LOWER LEG 11/07/2006   STATE, SYMPTOMATIC MENOPAUSE/FEM CLIMACTERIC 10/27/2006   Depression, recurrent (HCC) 09/15/2006   Thoracic or lumbosacral neuritis or radiculitis, unspecified 09/15/2006   IBS 09/09/2006   STRICTURE, ESOPHAGEAL 03/16/2001   Home Medication(s) Prior to Admission medications   Medication Sig Start Date End Date Taking? Authorizing Provider  albuterol (VENTOLIN HFA) 108 (90 Base) MCG/ACT inhaler INHALE 2 PUFFS BY MOUTH EVERY 6 HOURS AS NEEDED FOR SHORTNESS OF BREATH AND WHEEZING 10/31/22   Nafziger, Kandee Keen, NP  amoxicillin-clavulanate (AUGMENTIN) 875-125 MG tablet Take 1 tablet by mouth 2 (two) times daily. 04/29/23   Shirline Frees, NP  aspirin EC 81 MG tablet Take 1 tablet (81 mg total) by mouth daily. Swallow whole. 09/27/19   Kathleene Hazel, MD  atorvastatin (LIPITOR) 40 MG tablet TAKE 1 TABLET BY MOUTH EVERY EVENING 01/07/23   Nafziger, Kandee Keen, NP  clopidogrel (PLAVIX) 75 MG tablet Take 1 tablet (75 mg total) by mouth daily. 01/06/23   Swinyer, Zachary George, NP  fenofibrate (TRICOR) 145 MG tablet TAKE 1 TABLET BY MOUTH EVERY EVENING 01/07/23   Nafziger, Kandee Keen, NP  fluticasone (FLONASE) 50 MCG/ACT  nasal spray INSTILL TWO (2) SPRAYS IN EACH NOSTRIL DAILY 10/31/22   Nafziger, Kandee Keen, NP  furosemide (LASIX) 20 MG tablet TAKE ONE TABLET BY MOUTH ONCE Daily 04/11/23   Nafziger, Kandee Keen, NP  losartan (COZAAR) 100 MG tablet Take 100 mg by mouth daily.    [provider]  meclizine (ANTIVERT) 25 MG tablet Take 1 tablet (25 mg total) by mouth 3 (three) times daily  as needed for dizziness. 01/30/23   Elayne Snare K, DO  methadone (DOLOPHINE) 10 MG/ML solution Take 80 mg by mouth daily.    [provider]  methimazole (TAPAZOLE) 10 MG tablet Take 1 tablet (10 mg total) by mouth 2 (two) times daily. TAKE 1 TABLET BY MOUTH EACH EVENING 05/08/23 08/06/23  Altamese Tekoa, MD  metoprolol succinate (TOPROL-XL) 50 MG 24 hr tablet TAKE 1 TABLET BY MOUTH EVERY MORNING WITH OR IMMEDIATELY FOLLOWING A MEAL 10/15/22   Nafziger, Kandee Keen, NP  nitroGLYCERIN (NITROSTAT) 0.4 MG SL tablet Place 1 tablet (0.4 mg total) under the tongue every 5 (five) minutes as needed for chest pain. 01/06/23 04/06/23  Swinyer, Zachary George, NP  ondansetron (ZOFRAN) 4 MG tablet Take 1 tablet (4 mg total) by mouth every 6 (six) hours. 01/30/23   Elayne Snare K, DO  ondansetron (ZOFRAN-ODT) 8 MG disintegrating tablet PLACE 1 TABLET ON TONGUE AND ALLOW TO DISSOLVE EVERY 8 HOURS AS NEEDED FOR NAUSEA & VOMITING 12/31/22   Nafziger, Kandee Keen, NP  pantoprazole (PROTONIX) 40 MG tablet TAKE 1 TABLET BY MOUTH EVERY MORNING 01/29/23   Nafziger, Kandee Keen, NP  predniSONE (DELTASONE) 10 MG tablet 40 mg x 3 days, 20 mg x 3 days, 10 mg x 3 days 04/29/23   Shirline Frees, NP  venlafaxine XR (EFFEXOR-XR) 150 MG 24 hr capsule TAKE 1 CAPSULE BY MOUTH EVERY MORNING 11/29/22   Nafziger, Kandee Keen, NP                                                                                                                                    Allergies Codeine, Lisinopril, Sulfonamide derivatives, and Tetracyclines & related  Review of Systems Review of Systems  Cardiovascular:  Positive for chest pain.   As noted in HPI  Physical Exam Vital Signs  I have reviewed the triage vital signs BP 99/67 (BP Location: Left Arm)   Pulse 67   Temp 98.2 F (36.8 C) (Oral)   Resp 15   Ht 5\' 10"  (1.778 m)   Wt 78.9 kg   SpO2 90%   BMI 24.97 kg/m  *** Physical Exam  ED Results and Treatments Labs (all labs ordered are listed,  but only abnormal results are displayed) Labs Reviewed  BASIC METABOLIC PANEL WITH GFR - Abnormal; Notable for the following components:      Result Value   Sodium 134 (*)    Potassium 3.4 (*)    Glucose, Bld 139 (*)    Creatinine, Ser 1.05 (*)  Calcium 8.8 (*)    GFR, Estimated 58 (*)    All other components within normal limits  CBC - Abnormal; Notable for the following components:   Hemoglobin 10.8 (*)    HCT 35.9 (*)    All other components within normal limits  CBG MONITORING, ED - Abnormal; Notable for the following components:   Glucose-Capillary 126 (*)    All other components within normal limits  I-STAT CHEM 8, ED - Abnormal; Notable for the following components:   Potassium 3.4 (*)    Creatinine, Ser 1.20 (*)    Glucose, Bld 118 (*)    Calcium, Ion 1.13 (*)    Hemoglobin 8.8 (*)    HCT 26.0 (*)    All other components within normal limits  BRAIN NATRIURETIC PEPTIDE  TROPONIN I (HIGH SENSITIVITY)  TROPONIN I (HIGH SENSITIVITY)                                                                                                                         EKG  EKG Interpretation Date/Time:    Ventricular Rate:    PR Interval:    QRS Duration:    QT Interval:    QTC Calculation:   R Axis:      Text Interpretation:         Radiology DG Wrist Complete Right Result Date: 05/13/2023 CLINICAL DATA:  Fall EXAM: RIGHT WRIST - COMPLETE 3+ VIEW COMPARISON:  None Available. FINDINGS: Soft tissue swelling along the dorsum of the hand. Oblique fracture noted through the right 4th proximal phalanx. No subluxation or dislocation. IMPRESSION: Oblique fracture through the right 4th proximal phalanx. Electronically Signed   By: Charlett Nose M.D.   On: 05/13/2023 01:33   CT CHEST ABDOMEN PELVIS W CONTRAST Result Date: 05/13/2023 CLINICAL DATA:  Polytrauma, blunt.  Multiple falls. EXAM: CT CHEST, ABDOMEN, AND PELVIS WITH CONTRAST TECHNIQUE: Multidetector CT imaging of the chest, abdomen  and pelvis was performed following the standard protocol during bolus administration of intravenous contrast. RADIATION DOSE REDUCTION: This exam was performed according to the departmental dose-optimization program which includes automated exposure control, adjustment of the mA and/or kV according to patient size and/or use of iterative reconstruction technique. CONTRAST:  OMNIPAQUE IOHEXOL 300 MG/ML  SOLN COMPARISON:  03/10/2020 FINDINGS: CT CHEST FINDINGS Cardiovascular: Heart is normal size. Aorta is normal caliber. Coronary artery and aortic atherosclerosis. Mediastinum/Nodes: No mediastinal, hilar, or axillary adenopathy. Trachea and esophagus are unremarkable. Enlarged heterogeneous thyroid with numerous small nodules. This has been evaluated on previous imaging. (ref: J Am Coll Radiol. 2015 Feb;12(2): 143-50). Lungs/Pleura: Biapical scarring. Linear scarring or atelectasis at the right lung base. No effusions. Musculoskeletal: Chest wall soft tissues are unremarkable. Mild compression fracture involving the superior endplate of T9, age indeterminate. CT ABDOMEN PELVIS FINDINGS Hepatobiliary: No focal hepatic abnormality. Gallbladder unremarkable. Pancreas: No focal abnormality or ductal dilatation. Spleen: No focal abnormality.  Normal size. Adrenals/Urinary Tract: No adrenal abnormality. No focal renal abnormality. No stones  or hydronephrosis. Urinary bladder is unremarkable. Stomach/Bowel: Stomach, large and small bowel grossly unremarkable. Vascular/Lymphatic: Aortic atherosclerosis. No evidence of aneurysm or adenopathy. Reproductive: Prior hysterectomy.  No adnexal masses. Other: No free fluid or free air. Musculoskeletal: No acute bony abnormality. Prior vertebroplasty at L3. Scoliosis and degenerative changes. IMPRESSION: Linear right basilar opacities, likely atelectasis or scarring. Coronary artery disease, aortic atherosclerosis Mild compression fracture through the superior endplate of T9,  age indeterminate. Electronically Signed   By: Charlett Nose M.D.   On: 05/13/2023 01:27   CT Head Wo Contrast Result Date: 05/13/2023 CLINICAL DATA:  Right arm is swollen with bilateral leg swelling as well. Multiple falls recently. Has hit head with some of the falls. Unsure if taking blood thinner. EXAM: CT HEAD WITHOUT CONTRAST CT CERVICAL SPINE WITHOUT CONTRAST TECHNIQUE: Multidetector CT imaging of the head and cervical spine was performed following the standard protocol without intravenous contrast. Multiplanar CT image reconstructions of the cervical spine were also generated. RADIATION DOSE REDUCTION: This exam was performed according to the departmental dose-optimization program which includes automated exposure control, adjustment of the mA and/or kV according to patient size and/or use of iterative reconstruction technique. COMPARISON:  Cervical spine radiographs 04/11/2023; CT cervical spine 12/29/2020; MRI head 01/30/2023; CT head 12/30/2020 FINDINGS: CT HEAD FINDINGS Brain: No intracranial hemorrhage, mass effect, or evidence of acute infarct. No hydrocephalus. No extra-axial fluid collection. Vascular: No hyperdense vessel. Intracranial arterial calcification. Skull: No fracture or focal lesion. Sinuses/Orbits: No acute finding. Trace mucosal thickening in the right maxillary sinus with air-fluid level. Other: None. CT CERVICAL SPINE FINDINGS Alignment: No evidence of traumatic malalignment. Skull base and vertebrae: No acute fracture. No primary bone lesion or focal pathologic process. Soft tissues and spinal canal: No prevertebral fluid or swelling. No visible canal hematoma. Disc levels: Interbody spacer at C3-C4. ACDF C5-C6. Moderate disc space height loss and degenerative endplate changes compatible with adjacent segment disease at C6-C7. No severe spinal canal narrowing. Multilevel facet arthropathy. Upper chest: No acute abnormality. Other: Carotid calcification. IMPRESSION: 1. No acute  intracranial abnormality. 2. No acute fracture in the cervical spine. 3. Trace mucosal thickening with air-fluid level in the right maxillary sinus. Correlate for sinusitis. Electronically Signed   By: Minerva Fester M.D.   On: 05/13/2023 01:25   CT Cervical Spine Wo Contrast Result Date: 05/13/2023 CLINICAL DATA:  Right arm is swollen with bilateral leg swelling as well. Multiple falls recently. Has hit head with some of the falls. Unsure if taking blood thinner. EXAM: CT HEAD WITHOUT CONTRAST CT CERVICAL SPINE WITHOUT CONTRAST TECHNIQUE: Multidetector CT imaging of the head and cervical spine was performed following the standard protocol without intravenous contrast. Multiplanar CT image reconstructions of the cervical spine were also generated. RADIATION DOSE REDUCTION: This exam was performed according to the departmental dose-optimization program which includes automated exposure control, adjustment of the mA and/or kV according to patient size and/or use of iterative reconstruction technique. COMPARISON:  Cervical spine radiographs 04/11/2023; CT cervical spine 12/29/2020; MRI head 01/30/2023; CT head 12/30/2020 FINDINGS: CT HEAD FINDINGS Brain: No intracranial hemorrhage, mass effect, or evidence of acute infarct. No hydrocephalus. No extra-axial fluid collection. Vascular: No hyperdense vessel. Intracranial arterial calcification. Skull: No fracture or focal lesion. Sinuses/Orbits: No acute finding. Trace mucosal thickening in the right maxillary sinus with air-fluid level. Other: None. CT CERVICAL SPINE FINDINGS Alignment: No evidence of traumatic malalignment. Skull base and vertebrae: No acute fracture. No primary bone lesion or focal pathologic process. Soft tissues and  spinal canal: No prevertebral fluid or swelling. No visible canal hematoma. Disc levels: Interbody spacer at C3-C4. ACDF C5-C6. Moderate disc space height loss and degenerative endplate changes compatible with adjacent segment disease  at C6-C7. No severe spinal canal narrowing. Multilevel facet arthropathy. Upper chest: No acute abnormality. Other: Carotid calcification. IMPRESSION: 1. No acute intracranial abnormality. 2. No acute fracture in the cervical spine. 3. Trace mucosal thickening with air-fluid level in the right maxillary sinus. Correlate for sinusitis. Electronically Signed   By: Minerva Fester M.D.   On: 05/13/2023 01:25   DG ELBOW COMPLETE LEFT (3+VIEW) Result Date: 05/13/2023 CLINICAL DATA:  Frequent falls due to her legs giving out. Polytrauma to the knees and elbows. EXAM: LEFT ELBOW - COMPLETE 3+ VIEW; RIGHT ELBOW - COMPLETE 3+ VIEW; RIGHT KNEE - COMPLETE 4+ VIEW; LEFT KNEE - COMPLETE 4+ VIEW COMPARISON:  Right knee series 11/14/2017. No other relevant prior study. FINDINGS: Four views of the left elbow: There is an antecubital IV. Mild soft tissue swelling is noted dorsally but no joint effusion is seen. There is no evidence of fracture, dislocation or degenerative changes. Four views of the right elbow: Moderate diffuse stranding soft tissue edema extends over the upper arm and elbow region into the forearm. There is no evidence of fracture, dislocation or joint effusion. No evidence of arthropathy or other focal bone abnormality. The bone mineralization appears normal. Right knee, four views: The bone mineralization is low normal to slightly osteopenic. There is a small suprapatellar bursal effusion on the lateral view as well as calcifications in the femoral and popliteal artery. There is mild femorotibial joint narrowing and trace marginal spurring change. Patellofemoral joint is unremarkable. There is no evidence of fracture, dislocation or other focal bone abnormality. No bone lesion is seen. Left knee, four views: There is low normal to slightly osteopenic bone mineralization. No joint effusion is seen. No evidence of fracture or dislocation. There is mild femorotibial joint space loss and trace marginal spurring  with unremarkable patellofemoral joint. Soft tissues are unremarkable apart from calcifications in the distal femoral artery and popliteal artery. IMPRESSION: 1. Mild soft tissue swelling dorsally in the left elbow. No evidence of fracture or dislocation. 2. Moderate diffuse stranding soft tissue edema in the right upper arm, elbow and forearm. No evidence of fracture or dislocation. 3. Small right suprapatellar bursal effusion. No evidence of fractures of the bilateral knees. 4. Mild bilateral femorotibial joint space loss and trace marginal spurring. 5. Peripheral atherosclerosis. Electronically Signed   By: Almira Bar M.D.   On: 05/13/2023 00:43   DG ELBOW COMPLETE RIGHT (3+VIEW) Result Date: 05/13/2023 CLINICAL DATA:  Frequent falls due to her legs giving out. Polytrauma to the knees and elbows. EXAM: LEFT ELBOW - COMPLETE 3+ VIEW; RIGHT ELBOW - COMPLETE 3+ VIEW; RIGHT KNEE - COMPLETE 4+ VIEW; LEFT KNEE - COMPLETE 4+ VIEW COMPARISON:  Right knee series 11/14/2017. No other relevant prior study. FINDINGS: Four views of the left elbow: There is an antecubital IV. Mild soft tissue swelling is noted dorsally but no joint effusion is seen. There is no evidence of fracture, dislocation or degenerative changes. Four views of the right elbow: Moderate diffuse stranding soft tissue edema extends over the upper arm and elbow region into the forearm. There is no evidence of fracture, dislocation or joint effusion. No evidence of arthropathy or other focal bone abnormality. The bone mineralization appears normal. Right knee, four views: The bone mineralization is low normal to slightly osteopenic. There  is a small suprapatellar bursal effusion on the lateral view as well as calcifications in the femoral and popliteal artery. There is mild femorotibial joint narrowing and trace marginal spurring change. Patellofemoral joint is unremarkable. There is no evidence of fracture, dislocation or other focal bone abnormality.  No bone lesion is seen. Left knee, four views: There is low normal to slightly osteopenic bone mineralization. No joint effusion is seen. No evidence of fracture or dislocation. There is mild femorotibial joint space loss and trace marginal spurring with unremarkable patellofemoral joint. Soft tissues are unremarkable apart from calcifications in the distal femoral artery and popliteal artery. IMPRESSION: 1. Mild soft tissue swelling dorsally in the left elbow. No evidence of fracture or dislocation. 2. Moderate diffuse stranding soft tissue edema in the right upper arm, elbow and forearm. No evidence of fracture or dislocation. 3. Small right suprapatellar bursal effusion. No evidence of fractures of the bilateral knees. 4. Mild bilateral femorotibial joint space loss and trace marginal spurring. 5. Peripheral atherosclerosis. Electronically Signed   By: Almira Bar M.D.   On: 05/13/2023 00:43   DG Knee Complete 4 Views Right Result Date: 05/13/2023 CLINICAL DATA:  Frequent falls due to her legs giving out. Polytrauma to the knees and elbows. EXAM: LEFT ELBOW - COMPLETE 3+ VIEW; RIGHT ELBOW - COMPLETE 3+ VIEW; RIGHT KNEE - COMPLETE 4+ VIEW; LEFT KNEE - COMPLETE 4+ VIEW COMPARISON:  Right knee series 11/14/2017. No other relevant prior study. FINDINGS: Four views of the left elbow: There is an antecubital IV. Mild soft tissue swelling is noted dorsally but no joint effusion is seen. There is no evidence of fracture, dislocation or degenerative changes. Four views of the right elbow: Moderate diffuse stranding soft tissue edema extends over the upper arm and elbow region into the forearm. There is no evidence of fracture, dislocation or joint effusion. No evidence of arthropathy or other focal bone abnormality. The bone mineralization appears normal. Right knee, four views: The bone mineralization is low normal to slightly osteopenic. There is a small suprapatellar bursal effusion on the lateral view as well as  calcifications in the femoral and popliteal artery. There is mild femorotibial joint narrowing and trace marginal spurring change. Patellofemoral joint is unremarkable. There is no evidence of fracture, dislocation or other focal bone abnormality. No bone lesion is seen. Left knee, four views: There is low normal to slightly osteopenic bone mineralization. No joint effusion is seen. No evidence of fracture or dislocation. There is mild femorotibial joint space loss and trace marginal spurring with unremarkable patellofemoral joint. Soft tissues are unremarkable apart from calcifications in the distal femoral artery and popliteal artery. IMPRESSION: 1. Mild soft tissue swelling dorsally in the left elbow. No evidence of fracture or dislocation. 2. Moderate diffuse stranding soft tissue edema in the right upper arm, elbow and forearm. No evidence of fracture or dislocation. 3. Small right suprapatellar bursal effusion. No evidence of fractures of the bilateral knees. 4. Mild bilateral femorotibial joint space loss and trace marginal spurring. 5. Peripheral atherosclerosis. Electronically Signed   By: Almira Bar M.D.   On: 05/13/2023 00:43   DG Knee Complete 4 Views Left Result Date: 05/13/2023 CLINICAL DATA:  Frequent falls due to her legs giving out. Polytrauma to the knees and elbows. EXAM: LEFT ELBOW - COMPLETE 3+ VIEW; RIGHT ELBOW - COMPLETE 3+ VIEW; RIGHT KNEE - COMPLETE 4+ VIEW; LEFT KNEE - COMPLETE 4+ VIEW COMPARISON:  Right knee series 11/14/2017. No other relevant prior study. FINDINGS: Four  views of the left elbow: There is an antecubital IV. Mild soft tissue swelling is noted dorsally but no joint effusion is seen. There is no evidence of fracture, dislocation or degenerative changes. Four views of the right elbow: Moderate diffuse stranding soft tissue edema extends over the upper arm and elbow region into the forearm. There is no evidence of fracture, dislocation or joint effusion. No evidence of  arthropathy or other focal bone abnormality. The bone mineralization appears normal. Right knee, four views: The bone mineralization is low normal to slightly osteopenic. There is a small suprapatellar bursal effusion on the lateral view as well as calcifications in the femoral and popliteal artery. There is mild femorotibial joint narrowing and trace marginal spurring change. Patellofemoral joint is unremarkable. There is no evidence of fracture, dislocation or other focal bone abnormality. No bone lesion is seen. Left knee, four views: There is low normal to slightly osteopenic bone mineralization. No joint effusion is seen. No evidence of fracture or dislocation. There is mild femorotibial joint space loss and trace marginal spurring with unremarkable patellofemoral joint. Soft tissues are unremarkable apart from calcifications in the distal femoral artery and popliteal artery. IMPRESSION: 1. Mild soft tissue swelling dorsally in the left elbow. No evidence of fracture or dislocation. 2. Moderate diffuse stranding soft tissue edema in the right upper arm, elbow and forearm. No evidence of fracture or dislocation. 3. Small right suprapatellar bursal effusion. No evidence of fractures of the bilateral knees. 4. Mild bilateral femorotibial joint space loss and trace marginal spurring. 5. Peripheral atherosclerosis. Electronically Signed   By: Almira Bar M.D.   On: 05/13/2023 00:43   DG Chest 2 View Result Date: 05/13/2023 CLINICAL DATA:  Chest pain EXAM: CHEST - 2 VIEW COMPARISON:  04/11/2023 FINDINGS: Mild cardiomegaly. No confluent airspace opacities or effusions. No pneumothorax. No acute bony abnormality. IMPRESSION: Mild cardiomegaly.  No active disease. Electronically Signed   By: Charlett Nose M.D.   On: 05/13/2023 00:29    Medications Ordered in ED Medications  amoxicillin-clavulanate (AUGMENTIN) 875-125 MG per tablet 1 tablet (1 tablet Oral Given 05/13/23 0047)  methadone (DOLOPHINE) tablet 80 mg  (80 mg Oral Given 05/13/23 0046)  acetaminophen (TYLENOL) tablet 1,000 mg (1,000 mg Oral Given 05/13/23 0047)  iohexol (OMNIPAQUE) 300 MG/ML solution 100 mL (100 mLs Intravenous Contrast Given 05/13/23 0056)   Procedures Procedures  (including critical care time) Medical Decision Making / ED Course   Medical Decision Making Amount and/or Complexity of Data Reviewed Labs: ordered. Radiology: ordered.  Risk OTC drugs. Prescription drug management.    ***    Final Clinical Impression(s) / ED Diagnoses Final diagnoses:  None    This chart was dictated using voice recognition software.  Despite best efforts to proofread,  errors can occur which can change the documentation meaning.

## 2023-05-15 ENCOUNTER — Ambulatory Visit: Payer: Self-pay | Admitting: Adult Health

## 2023-05-15 NOTE — Telephone Encounter (Signed)
 Center of chest pain, constant (10/10)  Symptoms: Radiates to back, difficulty breathing, dizziness Pertinent Negatives: Patient denies Nausea, vomiting, cough  Disposition: [x] ED [x] Refused Recommended Disposition   Additional Notes: Pt was seen in ED on 3/31 and was told her "sternum is loose." This RN advised pt to go back to ED based off pain level. Pt refused ED disposition and requests to schedule an appointment with Shirline Frees, NP. Pt also requests a refill of her Tramadol for pain. This RN notified [facility] of pt refusal.    Copied from CRM [id number]. Topic: Clinical - Red Word Triage >> May 15, 2023  2:18 PM Alcus Dad wrote: Red Word that prompted transfer to Nurse Triage: Patient is in extreme pain and stated she is having breathing issues. Have infection in chest Reason for Disposition . SEVERE chest pain  Answer Assessment - Initial Assessment Questions LOCATION: "Where does it hurt?"       Center of chest RADIATION: "Does the pain go anywhere else?" (e.g., into neck, jaw, arms, back)     Radiates to back ONSET: "When did the chest pain begin?" (Minutes, hours or days)      A few days ago PATTERN: "Does the pain come and go, or has it been constant since it started?"  "Does it get worse with exertion?"      Constant SEVERITY: "How bad is the pain?"  (e.g., Scale 1-10; mild, moderate, or severe)    - MILD (1-3): doesn't interfere with normal activities     - MODERATE (4-7): interferes with normal activities or awakens from sleep    - SEVERE (8-10): excruciating pain, unable to do any normal activities       10/10 OTHER SYMPTOMS: "Do you have any other symptoms?" (e.g., dizziness, nausea, vomiting, sweating, fever, difficulty breathing, cough)       Denies all except dizziness  Protocols used: Chest Pain-A-AH

## 2023-05-19 DIAGNOSIS — G8929 Other chronic pain: Secondary | ICD-10-CM | POA: Diagnosis not present

## 2023-05-19 DIAGNOSIS — M25532 Pain in left wrist: Secondary | ICD-10-CM | POA: Diagnosis not present

## 2023-05-19 DIAGNOSIS — M79641 Pain in right hand: Secondary | ICD-10-CM | POA: Diagnosis not present

## 2023-05-19 DIAGNOSIS — S62614A Displaced fracture of proximal phalanx of right ring finger, initial encounter for closed fracture: Secondary | ICD-10-CM | POA: Diagnosis not present

## 2023-05-19 DIAGNOSIS — M79631 Pain in right forearm: Secondary | ICD-10-CM | POA: Diagnosis not present

## 2023-05-27 ENCOUNTER — Other Ambulatory Visit: Payer: Self-pay | Admitting: Adult Health

## 2023-05-30 ENCOUNTER — Ambulatory Visit
Admission: RE | Admit: 2023-05-30 | Discharge: 2023-05-30 | Disposition: A | Discharge status: Home / Self Care | Source: Ambulatory Visit | Attending: Adult Health

## 2023-05-30 ENCOUNTER — Ambulatory Visit
Admission: RE | Admit: 2023-05-30 | Discharge: 2023-05-30 | Disposition: A | Discharge status: Home / Self Care | Source: Ambulatory Visit | Attending: Adult Health | Admitting: Adult Health

## 2023-05-30 DIAGNOSIS — M4854XA Collapsed vertebra, not elsewhere classified, thoracic region, initial encounter for fracture: Secondary | ICD-10-CM | POA: Diagnosis not present

## 2023-05-30 DIAGNOSIS — M542 Cervicalgia: Secondary | ICD-10-CM

## 2023-05-30 DIAGNOSIS — M546 Pain in thoracic spine: Secondary | ICD-10-CM | POA: Diagnosis not present

## 2023-05-30 DIAGNOSIS — S32030A Wedge compression fracture of third lumbar vertebra, initial encounter for closed fracture: Secondary | ICD-10-CM

## 2023-05-30 DIAGNOSIS — M47812 Spondylosis without myelopathy or radiculopathy, cervical region: Secondary | ICD-10-CM | POA: Diagnosis not present

## 2023-05-30 DIAGNOSIS — M5416 Radiculopathy, lumbar region: Secondary | ICD-10-CM

## 2023-05-30 DIAGNOSIS — S22030A Wedge compression fracture of third thoracic vertebra, initial encounter for closed fracture: Secondary | ICD-10-CM

## 2023-05-30 DIAGNOSIS — M4802 Spinal stenosis, cervical region: Secondary | ICD-10-CM | POA: Diagnosis not present

## 2023-05-30 DIAGNOSIS — M545 Low back pain, unspecified: Secondary | ICD-10-CM | POA: Diagnosis not present

## 2023-06-06 ENCOUNTER — Ambulatory Visit: Admitting: Family Medicine

## 2023-06-06 ENCOUNTER — Encounter: Payer: Self-pay | Admitting: Family Medicine

## 2023-06-06 VITALS — BP 150/80 | HR 73 | Temp 98.0°F | Resp 16 | Ht 70.0 in | Wt 176.4 lb

## 2023-06-06 DIAGNOSIS — I10 Essential (primary) hypertension: Secondary | ICD-10-CM | POA: Diagnosis not present

## 2023-06-06 DIAGNOSIS — R600 Localized edema: Secondary | ICD-10-CM

## 2023-06-06 DIAGNOSIS — R0789 Other chest pain: Secondary | ICD-10-CM | POA: Diagnosis not present

## 2023-06-06 DIAGNOSIS — R296 Repeated falls: Secondary | ICD-10-CM | POA: Diagnosis not present

## 2023-06-06 DIAGNOSIS — F339 Major depressive disorder, recurrent, unspecified: Secondary | ICD-10-CM

## 2023-06-06 NOTE — Progress Notes (Signed)
 ACUTE VISIT Chief Complaint  Patient presents with   Arm Injury    edema   HPI: Laurie  D [name] is a 68 y.o. female with a PMHx significant for HTN, CAD, IBS, hyperthyroidism, OA, depression, chronic pain disorder, vitamin D  deficiency, B12 deficiency, and HLD, among others, who is here today with her friend complaining of swelling in her arm.  Patient states she has had swelling in her right forearm since her fall on 3/31. Endorses associated soreness and pain with palpation. She rates this pain as a 5/10.  Problem is constant. 05/13/23: Right elbow X ray noted area of edema but negative for fractures. She is following with orthopedics for left wrist pain and closed displaced fracture of proximal phalanx of right ring finger. Last visit 06/04/23, she is not sure about next f/u.   The swelling has not improved or worsened since the fall.  C/O worsening back pain since fall.  She was seen at the ER on 05/12/2023 for a fall.  On 05/13/2023 she had cervical, head, chest, and abdomen CT with contrast. She also had lumbar, thoracic, and cervical MRIs on 4/18.  Chest CT negative for mediastinal, Hillyer, or axillary adenopathy.  Enlarged heterogeneous thyroid  with numerous small nodules, which has been evaluated on previous imaging. Linear right basilar opacities, likely atelectasis or scarring. Coronary artery disease, aortic atherosclerosis  Mild compression fracture through the superior endplate of T9, age indeterminate.  Lab Results  Component Value Date   WBC 9.3 05/12/2023   HGB 8.8 (L) 05/13/2023   HCT 26.0 (L) 05/13/2023   MCV 89.3 05/12/2023   PLT 278 05/12/2023      Latest Ref Rng & Units 05/13/2023   12:40 AM 05/12/2023   11:45 PM 01/30/2023   12:34 AM  CBC  WBC 4.0 - 10.5 K/uL  9.3    Hemoglobin 12.0 - 15.0 g/dL 8.8  16.1  09.6   Hematocrit 36.0 - 46.0 % 26.0  35.9  37.0   Platelets 150 - 400 K/uL  278      Lab Results  Component Value Date   NA 138 05/13/2023   CL  101 05/13/2023   K 3.4 (L) 05/13/2023   CO2 29 05/12/2023   BUN 17 05/13/2023   CREATININE 1.20 (H) 05/13/2023   GFRNONAA 58 (L) 05/12/2023   CALCIUM  8.8 (L) 05/12/2023   PHOS 3.4 09/16/2018   ALBUMIN 3.9 12/09/2022   GLUCOSE 118 (H) 05/13/2023   Falls: Patient's friend mentions she has had at least 5 falls over the last 2-3 months.  She does not have a walker or cane today.  She complains of neck and back pain she rates as a 10/10, and also complains of bilateral constant chest pain exacerbated by breathing and with coughing. She has had CP for about 3-4 weeks, started after fall. She says she wheezes often. Denies worsening cough or SOB.  + Tobacco use.  -Her friend also reports she has been having constant double vision since 01/2023, she has seen her eye care provider for this problem, doe snot recall Dx. Problem is constant and stable. Also intermittent episodes of confusion and slurred speech for about a month or so, last episode yesterday while she was on the phone with her friend. She states that she was asleep and just woke up when she answered phone. Negative for unusual headache.  Reports increased depression symptoms and difficulty sleeping. She is forgetful, has been gradual for several months. Recalls the month, year, and day  of the week without difficulty, but cannot give the correct exact date.  Forgets to take her medications and occasionally she has taken more than prescribed.  Hypertension: Her friend reports she does not often take her medications correctly, but is currently on losartan  100 mg daily and metoprolol  succinate 50 mg daily. She checks her BP at home and says it is often 200/100. Today in the office it is 150/80.  CAD:Her friend also mentions she is no longer taking Plavix , but is taking aspirin  81 mg daily.  Negative for exertional chest pain, dyspnea,  focal weakness, or worsening LE edema.  Lab Results  Component Value Date   CREATININE 1.20 (H)  05/13/2023   BUN 17 05/13/2023   NA 138 05/13/2023   K 3.4 (L) 05/13/2023   CL 101 05/13/2023   CO2 29 05/12/2023   Review of Systems  Constitutional:  Positive for fatigue. Negative for appetite change and fever.  HENT:  Negative for sore throat.   Gastrointestinal:  Negative for abdominal pain and vomiting.  Endocrine: Negative for cold intolerance and heat intolerance.  Genitourinary:  Negative for decreased urine volume, dysuria and hematuria.  Skin:  Negative for rash.  Neurological:  Negative for syncope and facial asymmetry.  Psychiatric/Behavioral:  Negative for hallucinations.   See other pertinent positives and negatives in HPI.  Current Outpatient Medications on File Prior to Visit  Medication Sig Dispense Refill   albuterol  (VENTOLIN  HFA) 108 (90 Base) MCG/ACT inhaler INHALE 2 PUFFS BY MOUTH EVERY 6 HOURS AS NEEDED FOR SHORTNESS OF BREATH AND WHEEZING 8.5 g 10   amoxicillin -clavulanate (AUGMENTIN ) 875-125 MG tablet Take 1 tablet by mouth 2 (two) times daily. 20 tablet 0   aspirin  EC 81 MG tablet Take 1 tablet (81 mg total) by mouth daily. Swallow whole. 90 tablet 3   atorvastatin  (LIPITOR) 40 MG tablet TAKE 1 TABLET BY MOUTH EVERY EVENING 30 tablet 10   clopidogrel  (PLAVIX ) 75 MG tablet Take 1 tablet (75 mg total) by mouth daily. 90 tablet 3   fenofibrate  (TRICOR ) 145 MG tablet TAKE 1 TABLET BY MOUTH EVERY EVENING 30 tablet 10   fluticasone  (FLONASE ) 50 MCG/ACT nasal spray INSTILL TWO (2) SPRAYS IN EACH NOSTRIL DAILY 16 g 10   furosemide  (LASIX ) 20 MG tablet TAKE ONE TABLET BY MOUTH ONCE Daily 90 tablet 1   losartan  (COZAAR ) 100 MG tablet TAKE 1 TABLET BY MOUTH DAILY 90 tablet 0   meclizine  (ANTIVERT ) 25 MG tablet Take 1 tablet (25 mg total) by mouth 3 (three) times daily as needed for dizziness. 30 tablet 0   methadone  (DOLOPHINE ) 10 MG/ML solution Take 80 mg by mouth daily.     methimazole  (TAPAZOLE ) 10 MG tablet Take 1 tablet (10 mg total) by mouth 2 (two) times daily.  TAKE 1 TABLET BY MOUTH EACH EVENING 180 tablet 1   metoprolol  succinate (TOPROL -XL) 50 MG 24 hr tablet TAKE 1 TABLET BY MOUTH EVERY MORNING WITH OR IMMEDIATELY FOLLOWING A MEAL 30 tablet 10   ondansetron  (ZOFRAN ) 4 MG tablet Take 1 tablet (4 mg total) by mouth every 6 (six) hours. 12 tablet 0   ondansetron  (ZOFRAN -ODT) 8 MG disintegrating tablet PLACE 1 TABLET ON TONGUE AND ALLOW TO DISSOLVE EVERY 8 HOURS AS NEEDED FOR NAUSEA & VOMITING 20 tablet 10   pantoprazole  (PROTONIX ) 40 MG tablet TAKE 1 TABLET BY MOUTH EVERY MORNING 30 tablet 10   predniSONE  (DELTASONE ) 10 MG tablet 40 mg x 3 days, 20 mg x 3 days, 10  mg x 3 days 21 tablet 0   venlafaxine  XR (EFFEXOR -XR) 150 MG 24 hr capsule TAKE 1 CAPSULE BY MOUTH EVERY MORNING 30 capsule 10   nitroGLYCERIN  (NITROSTAT ) 0.4 MG SL tablet Place 1 tablet (0.4 mg total) under the tongue every 5 (five) minutes as needed for chest pain. 25 tablet 3   No current facility-administered medications on file prior to visit.    Past Medical History:  Diagnosis Date   Allergy    Anxiety    Arthritis    RA   Asthma    Chronic back pain    COPD (chronic obstructive pulmonary disease) (HCC)    Depression    Esophageal stricture    GERD (gastroesophageal reflux disease)    Heart murmur    Hematemesis 09/10/2018   Hyperlipidemia    Hypertension    IBS (irritable bowel syndrome)    Interstitial cystitis    Neuromuscular disorder (HCC)    fibromyalgia   PONV (postoperative nausea and vomiting)    Allergies  Allergen Reactions   Codeine Nausea And Vomiting   Lisinopril  Cough   Sulfonamide Derivatives Nausea And Vomiting   Tetracyclines & Related Rash    Social History   Socioeconomic History   Marital status: Single    Spouse name: Not on file   Number of children: Not on file   Years of education: Not on file   Highest education level: Not on file  Occupational History   Occupation: disability  Tobacco Use   Smoking status: Every Day     Current packs/day: 0.50    Types: Cigarettes   Smokeless tobacco: Never   Tobacco comments:    trying to quit   Vaping Use   Vaping status: Never Used  Substance and Sexual Activity   Alcohol use: Never    Comment: occasional   Drug use: Yes    Types: Marijuana    Comment: occas   Sexual activity: Not on file  Other Topics Concern   Not on file  Social History Narrative   Not on file   Social Drivers of Health   Financial Resource Strain: Low Risk  (06/17/2022)   Overall Financial Resource Strain (CARDIA)    Difficulty of Paying Living Expenses: Not hard at all  Food Insecurity: No Food Insecurity (07/15/2022)   Hunger Vital Sign    Worried About Running Out of Food in the Last Year: Never true    Ran Out of Food in the Last Year: Never true  Transportation Needs: No Transportation Needs (06/17/2022)   PRAPARE - Administrator, Civil Service (Medical): No    Lack of Transportation (Non-Medical): No  Physical Activity: Sufficiently Active (06/17/2022)   Exercise Vital Sign    Days of Exercise per Week: 7 days    Minutes of Exercise per Session: 30 min  Stress: No Stress Concern Present (06/17/2022)   [employer] of Occupational Health - Occupational Stress Questionnaire    Feeling of Stress : Not at all  Social Connections: Socially Integrated (06/17/2022)   Social Connection and Isolation Panel [NHANES]    Frequency of Communication with Friends and Family: More than three times a week    Frequency of Social Gatherings with Friends and Family: More than three times a week    Attends Religious Services: More than 4 times per year    Active Member of [employer] or Organizations: Yes    Attends Banker Meetings: More than 4 times per year  Marital Status: Living with partner    Vitals:   06/06/23 0824 06/06/23 0857  BP: (!) 150/80 (!) 150/80  Pulse: 73   Resp: 16   Temp: 98 F (36.7 C)   SpO2: 97%    Body mass index is 25.31 kg/m.  Physical  Exam Vitals and nursing note reviewed.  Constitutional:      General: She is not in acute distress.    Appearance: She is well-developed.  HENT:     Head: Normocephalic and atraumatic.     Mouth/Throat:     Mouth: Mucous membranes are moist.     Pharynx: Oropharynx is clear.  Eyes:     Conjunctiva/sclera: Conjunctivae normal.     Pupils: Pupils are equal, round, and reactive to light.     Comments: Right pupil slightly bigger.  Cardiovascular:     Rate and Rhythm: Normal rate and regular rhythm.     Pulses:          Radial pulses are 2+ on the right side.       Dorsalis pedis pulses are 2+ on the right side and 2+ on the left side.     Heart sounds: No murmur heard. Pulmonary:     Effort: Pulmonary effort is normal. No respiratory distress.     Breath sounds: Normal breath sounds.  Chest:     Chest wall: Tenderness present.  Abdominal:     Palpations: Abdomen is soft. There is no hepatomegaly or mass.     Tenderness: There is no abdominal tenderness.  Musculoskeletal:     Right forearm: Swelling present.     Right hand: Normal capillary refill. Normal pulse.       Arms:     Cervical back: Decreased range of motion.     Thoracic back: Deformity (kyphosis) present. No tenderness.  Lymphadenopathy:     Cervical: No cervical adenopathy.  Skin:    General: Skin is warm.     Findings: No rash.     Comments: Linear superficial scratch right forearm, healing well, no surrounded erythema or edema.  Neurological:     General: No focal deficit present.     Mental Status: She is alert and oriented to person, place, and time.     Comments: Mildly unstable gait, not assisted. She does not remember date but remembers month and year.  Psychiatric:        Mood and Affect: Mood and affect normal.    ASSESSMENT AND PLAN:  Ms. Koci was seen today for arm swelling.   Chest wall pain Hx and examination suggest musculoskeletal pain most likely related to local trauma with recent  fall. She had chest CT and CXR in the ED on 05/13/23.  I do not think further work up is needed at this time. Avoid shallow breathing. Instructed about warning signs.  Edema of right forearm Local edema, which she reports having noted right after fall but ehr friend did a few days ago. Possible etiologies discussed. Imaging was negative for fracture, so will hold on repeating imaging for now. Will arrange vas US  to evaluate for DVT. ? Soft tissue trauma. Continue monitoring for changes.  -     US  Venous Img Upper Uni Right(DVT); Future  Frequent falls Fall precautions discussed. Some of her chronic medical problems as well as medications can increase her risk for falls, may benefit from PT.  Essential hypertension BP is not well controlled today. Forgetting to take her medications. Recommend having someone helping with  med. No changes in Metoprolol  succinate or Losartan  dose. Possible complications of elevated BP discussed. F/U with PCP in 2 weeks.  Depression, recurrent (HCC) She does not feel like problem is well controlled. This problem could be causing or aggravating episodes of consusion reported by friend, which are not new but rather happening for a few months. Head CT done on 05/13/23. Recommend continuing Velanfaxine XR 150 mg daily, be sure she is taking medication daily and follow with PCP.  Several concerns today:  -Chronic pain: C/O worsening cervical and upper back pain. Has thoracic, cervical,and lumbar MRI on 05/30/23, reports are pending.  -Reports "double vision" and episodes of confusion.  Apparently this problem has been chronic, she was seen in the ED and upon record review, she had similar complaint about her vision in 01/2023. She is has seen eye care provider and this plan was discussed, she is not sure about instructions given. Episodes of slurred speech and confusion going on for a few months, instructed to address this with PCP.  -Anemia: It seems to be new  onset in 04/2023 and getting worse. She does not reports gross hematuria, blood in stool, or melena. Last colonoscopy 08/2013. EGD done in 08/2018 for episode of hematemesis. No PUD. Currently on Pantoprazole  40 mg daily. Not longer on Plavix . F/U with PCP in 2 weeks, before if needed.  I spent a total of 46 minutes in both face to face and non face to face activities for this visit on the date of this encounter. During this time history was obtained and documented, examination was performed, prior labs/imaging reviewed, and assessment/plan discussed.  Return in about 2 weeks (around 06/20/2023), or if symptoms worsen or fail to improve, for chronic problems with PCP.  I, Fritz Jewel Wierda, acting as a scribe for Olamide Lahaie [place], MD., have documented all relevant documentation on the behalf of Laurie Posada [place], MD, as directed by  Carrera Kiesel [place], MD while in the presence of Laurie Sze [place], MD.   I, Adalee Kathan [place], MD, have reviewed all documentation for this visit. The documentation on 06/06/23 for the exam, diagnosis, procedures, and orders are all accurate and complete.  Laurie Fillion G. [place], MD  [facility]. [place] office.

## 2023-06-06 NOTE — Patient Instructions (Addendum)
 A few things to remember from today's visit:  Edema of right forearm - Plan: US  Venous Img Upper Uni Right(DVT)  Chest wall pain  Frequent falls  Essential hypertension  Depression, recurrent (HCC)  Please arrange appt with Randel Buss to address your concerns in regard to depression and memory. Breath deep a few times per day. Will arrange ultrasound of forearm to evaluate for blood clot. Be sure to follow with ortho as recommended. Have someone administering her medications. Monitor blood pressure at home.   If you need refills for medications you take chronically, please call your pharmacy. Do not use My Chart to request refills or for acute issues that need immediate attention. If you send a my chart message, it may take a few days to be addressed, specially if I am not in the office.  Please be sure medication list is accurate. If a new problem present, please set up appointment sooner than planned today.

## 2023-06-10 ENCOUNTER — Ambulatory Visit (HOSPITAL_BASED_OUTPATIENT_CLINIC_OR_DEPARTMENT_OTHER)
Admission: RE | Admit: 2023-06-10 | Discharge: 2023-06-10 | Disposition: A | Discharge status: Home / Self Care | Source: Ambulatory Visit | Attending: Family Medicine | Admitting: Family Medicine

## 2023-06-10 DIAGNOSIS — R600 Localized edema: Secondary | ICD-10-CM | POA: Insufficient documentation

## 2023-06-11 ENCOUNTER — Ambulatory Visit (INDEPENDENT_AMBULATORY_CARE_PROVIDER_SITE_OTHER): Admitting: Adult Health

## 2023-06-11 ENCOUNTER — Encounter: Payer: Self-pay | Admitting: Adult Health

## 2023-06-11 ENCOUNTER — Encounter: Payer: Self-pay | Admitting: Family Medicine

## 2023-06-11 VITALS — BP 190/90 | HR 94 | Temp 98.0°F | Ht 70.0 in | Wt 172.0 lb

## 2023-06-11 DIAGNOSIS — I10 Essential (primary) hypertension: Secondary | ICD-10-CM | POA: Diagnosis not present

## 2023-06-11 DIAGNOSIS — D649 Anemia, unspecified: Secondary | ICD-10-CM

## 2023-06-11 DIAGNOSIS — E559 Vitamin D deficiency, unspecified: Secondary | ICD-10-CM

## 2023-06-11 DIAGNOSIS — F321 Major depressive disorder, single episode, moderate: Secondary | ICD-10-CM | POA: Diagnosis not present

## 2023-06-11 DIAGNOSIS — R600 Localized edema: Secondary | ICD-10-CM | POA: Diagnosis not present

## 2023-06-11 DIAGNOSIS — R2681 Unsteadiness on feet: Secondary | ICD-10-CM | POA: Diagnosis not present

## 2023-06-11 DIAGNOSIS — R2231 Localized swelling, mass and lump, right upper limb: Secondary | ICD-10-CM

## 2023-06-11 LAB — CBC
HCT: 32.7 % — ABNORMAL LOW (ref 36.0–46.0)
Hemoglobin: 10.7 g/dL — ABNORMAL LOW (ref 12.0–15.0)
MCHC: 32.9 g/dL (ref 30.0–36.0)
MCV: 83.7 fl (ref 78.0–100.0)
Platelets: 350 10*3/uL (ref 150.0–400.0)
RBC: 3.9 Mil/uL (ref 3.87–5.11)
RDW: 14.8 % (ref 11.5–15.5)
WBC: 7.3 10*3/uL (ref 4.0–10.5)

## 2023-06-11 LAB — BASIC METABOLIC PANEL WITH GFR
BUN: 22 mg/dL (ref 6–23)
CO2: 30 meq/L (ref 19–32)
Calcium: 9.9 mg/dL (ref 8.4–10.5)
Chloride: 101 meq/L (ref 96–112)
Creatinine, Ser: 1.09 mg/dL (ref 0.40–1.20)
GFR: 52.18 mL/min — ABNORMAL LOW (ref >60.00)
Glucose, Bld: 91 mg/dL (ref 70–99)
Potassium: 4.5 meq/L (ref 3.5–5.1)
Sodium: 139 meq/L (ref 135–145)

## 2023-06-11 LAB — IBC + FERRITIN
Ferritin: 68.1 ng/mL (ref 10.0–291.0)
Iron: 79 ug/dL (ref 42–145)
Saturation Ratios: 20.7 % (ref 20.0–50.0)
TIBC: 380.8 ug/dL (ref 250.0–450.0)
Transferrin: 272 mg/dL (ref 212.0–360.0)

## 2023-06-11 LAB — VITAMIN D 25 HYDROXY (VIT D DEFICIENCY, FRACTURES): VITD: 16.83 ng/mL — ABNORMAL LOW (ref 30.00–100.00)

## 2023-06-11 MED ORDER — OLMESARTAN MEDOXOMIL 40 MG PO TABS: 40.0000 mg | ORAL_TABLET | Freq: Every day | ORAL | 0 refills | Status: DC

## 2023-06-11 MED ORDER — VENLAFAXINE HCL ER 75 MG PO CP24
ORAL_CAPSULE | ORAL | 1 refills | Status: DC
Start: 2023-06-11 — End: 2023-12-09

## 2023-06-11 NOTE — Patient Instructions (Addendum)
 Stop Losartan  and start Olmesartan - take this with Metoprolol .   I am going to order you physical therapy to help with falling and  gait instability   I am going to check some blood work on you today   I am also going to order an MRI of the right arm

## 2023-06-11 NOTE — Progress Notes (Signed)
 Subjective:    Patient ID: Laurie [name], female    DOB: April 24, 1955, 68 y.o.   MRN: [id number]  HPI 68 year old female who  has a past medical history of Allergy, Anxiety, Arthritis, Asthma, Chronic back pain, COPD (chronic obstructive pulmonary disease) (HCC), Depression, Esophageal stricture, GERD (gastroesophageal reflux disease), Heart murmur, Hematemesis (09/10/2018), Hyperlipidemia, Hypertension, IBS (irritable bowel syndrome), Interstitial cystitis, Neuromuscular disorder (HCC), and PONV (postoperative nausea and vomiting).  She presents to the office today for follow-up after being seen by another provider in the office, Dr. [place] from 5 days ago for multiple issues  She had a fall on 05/12/2023 and reported having swelling in her right forearm since the fall.  She endorsed soreness and pain with palpation.  She was seen in the emergency room after this fall, x-ray of the right elbow noted area of edema but negative for fracture.  She had a displaced fracture of the proximal phalanx of the right ring finger and has been seen by orthopedics for this.  Was concern for DVT so ultrasound was ordered, this ultrasound came back earlier today which showed:   No evidence of DVT within the right upper extremity. 2. 10.5 x 2.0 x 5.7 cm heterogeneous masslike structure in the mid right forearm. Findings could reflect hematoma although appearance is not entirely characteristic of such. Muscular injury with swelling edema and hemorrhage would also be a consideration. Soft tissue neoplasm not excluded. MRI with and without contrast recommended to further evaluate.   She was also complaining of elevated blood pressure readings.  She was checking her blood pressure at home and reported readings in the 200s over 100s often.  In the office at the time the blood pressure was 150/80.  She reported that she was not taking her losartan  or metoprolol  correctly and often forgot to take it.  Advised to start  taking her medications daily and follow-up for further evaluation.  Today she reports that she has been taking her blood pressure on a daily basis, taking losartan  in the morning and metoprolol  in the evening.  Her blood pressures continued to be in the 170s to 200s over 90s to low 100 range.  She does report that she had a nosebleed yesterday and has at times slurred speech and confusion.  BP Readings from Last 3 Encounters:  06/11/23 (!) 190/90  06/06/23 (!) 150/80  05/13/23 109/74   Depression -did not feel as though her depression was well-controlled.  She did have a head CT on 05/13/2023 while in the emergency room and this was negative for acute finding.  She was advised to continue with Effexor  150 mg daily and to make sure she was taking this on a daily basis.  He was also complaining of worsening cervical and upper back pain.  She did have a thoracic, cervical, and lumbar MRI done on 05/30/2023 but reports are still pending  Anemia-seem to be a new onset in March 2025 and getting worse.  She denies gross hematuria, blood in stool, or melena.  Her last colonoscopy was in July 2025 and had a EGD done in July 2020 for episode of hematemesis. No PUD noted.  She is currently on Protonix  40 mg daily.  No longer on Plavix  due to frequent falls. Lab Results  Component Value Date   WBC 9.3 05/12/2023   HGB 8.8 (L) 05/13/2023   HCT 26.0 (L) 05/13/2023   MCV 89.3 05/12/2023   PLT 278 05/12/2023   Frequent Falls -she  reports that she has been using a walker that she got from a family member, has not had a fall in about a week.  She never received the walker that I sent in for her.  She is interested in doing physical therapy.  Review of Systems See HPI   Past Medical History:  Diagnosis Date   Allergy    Anxiety    Arthritis    RA   Asthma    Chronic back pain    COPD (chronic obstructive pulmonary disease) (HCC)    Depression    Esophageal stricture    GERD (gastroesophageal reflux  disease)    Heart murmur    Hematemesis 09/10/2018   Hyperlipidemia    Hypertension    IBS (irritable bowel syndrome)    Interstitial cystitis    Neuromuscular disorder (HCC)    fibromyalgia   PONV (postoperative nausea and vomiting)     Social History   Socioeconomic History   Marital status: Single    Spouse name: Not on file   Number of children: Not on file   Years of education: Not on file   Highest education level: Not on file  Occupational History   Occupation: disability  Tobacco Use   Smoking status: Every Day    Current packs/day: 0.50    Types: Cigarettes   Smokeless tobacco: Never   Tobacco comments:    trying to quit   Vaping Use   Vaping status: Never Used  Substance and Sexual Activity   Alcohol use: Never    Comment: occasional   Drug use: Yes    Types: Marijuana    Comment: occas   Sexual activity: Not on file  Other Topics Concern   Not on file  Social History Narrative   Not on file   Social Drivers of Health   Financial Resource Strain: Low Risk  (06/17/2022)   Overall Financial Resource Strain (CARDIA)    Difficulty of Paying Living Expenses: Not hard at all  Food Insecurity: No Food Insecurity (07/15/2022)   Hunger Vital Sign    Worried About Running Out of Food in the Last Year: Never true    Ran Out of Food in the Last Year: Never true  Transportation Needs: No Transportation Needs (06/17/2022)   PRAPARE - Administrator, Civil Service (Medical): No    Lack of Transportation (Non-Medical): No  Physical Activity: Sufficiently Active (06/17/2022)   Exercise Vital Sign    Days of Exercise per Week: 7 days    Minutes of Exercise per Session: 30 min  Stress: No Stress Concern Present (06/17/2022)   [employer] of Occupational Health - Occupational Stress Questionnaire    Feeling of Stress : Not at all  Social Connections: Socially Integrated (06/17/2022)   Social Connection and Isolation Panel [NHANES]    Frequency of  Communication with Friends and Family: More than three times a week    Frequency of Social Gatherings with Friends and Family: More than three times a week    Attends Religious Services: More than 4 times per year    Active Member of [employer] or Organizations: Yes    Attends Banker Meetings: More than 4 times per year    Marital Status: Living with partner  Intimate Partner Violence: Not At Risk (06/17/2022)   Humiliation, Afraid, Rape, and Kick questionnaire    Fear of Current or Ex-Partner: No    Emotionally Abused: No    Physically Abused: No  Sexually Abused: No    Past Surgical History:  Procedure Laterality Date   ABDOMINAL HYSTERECTOMY     APPENDECTOMY     BIOPSY  09/11/2018   Procedure: BIOPSY;  Surgeon: Brice Campi Albino Alu., MD;  Location: [facility] ENDOSCOPY;  Service: Gastroenterology;;   CERVICAL FUSION     CERVICAL LAMINECTOMY     CORONARY STENT INTERVENTION N/A 10/07/2019   Procedure: CORONARY STENT INTERVENTION;  Surgeon: Odie Benne, MD;  Location: [facility] IN[facility];  Service: Cardiovascular;  Laterality: N/A;   ESOPHAGOGASTRODUODENOSCOPY     ESOPHAGOGASTRODUODENOSCOPY (EGD) WITH PROPOFOL  N/A 09/11/2018   Procedure: ESOPHAGOGASTRODUODENOSCOPY (EGD) WITH PROPOFOL ;  Surgeon: Brice Campi Albino Alu., MD;  Location: [facility] ENDOSCOPY;  Service: Gastroenterology;  Laterality: N/A;   FINGER SURGERY     KYPHOPLASTY N/A 03/29/2021   Procedure: Lumbar Three KYPHOPLASTY;  Surgeon: Elna Haggis, MD;  Location: [facility] OR;  Service: Neurosurgery;  Laterality: N/A;   LEFT HEART CATH AND CORONARY ANGIOGRAPHY N/A 10/07/2019   Procedure: LEFT HEART CATH AND CORONARY ANGIOGRAPHY;  Surgeon: Odie Benne, MD;  Location: [facility];  Service: Cardiovascular;  Laterality: N/A;   LEFT HEART CATH AND CORONARY ANGIOGRAPHY N/A 06/20/2022   Procedure: LEFT HEART CATH AND CORONARY ANGIOGRAPHY;  Surgeon: Odie Benne, MD;  Location: [facility];  Service:  Cardiovascular;  Laterality: N/A;   LUMBAR FUSION     SAVORY DILATION N/A 09/11/2018   Procedure: SAVORY DILATION;  Surgeon: Brice Campi Albino Alu., MD;  Location: [facility] ENDOSCOPY;  Service: Gastroenterology;  Laterality: N/A;   TONSILLECTOMY      Family History  Problem Relation Age of Onset   Dementia Mother    Heart attack Father    Coronary artery disease Father    Cancer Brother        esophageal   Esophageal cancer Brother    Coronary artery disease Paternal Aunt    Stomach cancer Paternal Aunt    Coronary artery disease Paternal Grandmother    Aneurysm Brother        aortic   Rectal cancer Neg Hx    Colon cancer Neg Hx    Thyroid  disease Neg Hx     Allergies  Allergen Reactions   Codeine Nausea And Vomiting   Lisinopril  Cough   Sulfonamide Derivatives Nausea And Vomiting   Tetracyclines & Related Rash    Current Outpatient Medications on File Prior to Visit  Medication Sig Dispense Refill   albuterol  (VENTOLIN  HFA) 108 (90 Base) MCG/ACT inhaler INHALE 2 PUFFS BY MOUTH EVERY 6 HOURS AS NEEDED FOR SHORTNESS OF BREATH AND WHEEZING 8.5 g 10   aspirin  EC 81 MG tablet Take 1 tablet (81 mg total) by mouth daily. Swallow whole. 90 tablet 3   atorvastatin  (LIPITOR) 40 MG tablet TAKE 1 TABLET BY MOUTH EVERY EVENING 30 tablet 10   clopidogrel  (PLAVIX ) 75 MG tablet Take 1 tablet (75 mg total) by mouth daily. 90 tablet 3   fenofibrate  (TRICOR ) 145 MG tablet TAKE 1 TABLET BY MOUTH EVERY EVENING 30 tablet 10   fluticasone  (FLONASE ) 50 MCG/ACT nasal spray INSTILL TWO (2) SPRAYS IN EACH NOSTRIL DAILY 16 g 10   furosemide  (LASIX ) 20 MG tablet TAKE ONE TABLET BY MOUTH ONCE Daily 90 tablet 1   meclizine  (ANTIVERT ) 25 MG tablet Take 1 tablet (25 mg total) by mouth 3 (three) times daily as needed for dizziness. 30 tablet 0   methadone  (DOLOPHINE ) 10 MG/ML solution Take 80 mg by mouth daily.  methimazole  (TAPAZOLE ) 10 MG tablet Take 1 tablet (10 mg total) by mouth 2 (two) times daily.  TAKE 1 TABLET BY MOUTH EACH EVENING 180 tablet 1   metoprolol  succinate (TOPROL -XL) 50 MG 24 hr tablet TAKE 1 TABLET BY MOUTH EVERY MORNING WITH OR IMMEDIATELY FOLLOWING A MEAL 30 tablet 10   ondansetron  (ZOFRAN ) 4 MG tablet Take 1 tablet (4 mg total) by mouth every 6 (six) hours. 12 tablet 0   ondansetron  (ZOFRAN -ODT) 8 MG disintegrating tablet PLACE 1 TABLET ON TONGUE AND ALLOW TO DISSOLVE EVERY 8 HOURS AS NEEDED FOR NAUSEA & VOMITING 20 tablet 10   pantoprazole  (PROTONIX ) 40 MG tablet TAKE 1 TABLET BY MOUTH EVERY MORNING 30 tablet 10   venlafaxine  XR (EFFEXOR -XR) 150 MG 24 hr capsule TAKE 1 CAPSULE BY MOUTH EVERY MORNING 30 capsule 10   nitroGLYCERIN  (NITROSTAT ) 0.4 MG SL tablet Place 1 tablet (0.4 mg total) under the tongue every 5 (five) minutes as needed for chest pain. 25 tablet 3   No current facility-administered medications on file prior to visit.    BP (!) 190/90   Pulse 94   Temp 98 F (36.7 C) (Oral)   Ht 5\' 10"  (1.778 m)   Wt 172 lb (78 kg)   SpO2 95%   BMI 24.68 kg/m       Objective:   Physical Exam Constitutional:      Appearance: Normal appearance.  Cardiovascular:     Rate and Rhythm: Normal rate and regular rhythm.     Pulses: Normal pulses.     Heart sounds: Normal heart sounds.  Pulmonary:     Effort: Pulmonary effort is normal.     Breath sounds: Normal breath sounds.  Musculoskeletal:     Right lower leg: Edema present.     Left lower leg: Edema present.  Skin:    General: Skin is warm and dry.       Neurological:     General: No focal deficit present.     Mental Status: She is alert and oriented to person, place, and time.  Psychiatric:        Mood and Affect: Mood normal.        Behavior: Behavior normal.        Thought Content: Thought content normal.        Judgment: Judgment normal.       Assessment & Plan:  1. Essential hypertension (Primary) - Continue her on metoprolol , discontinue losartan  and add olmesartan 40 mg.  She is to take  these medications together each morning.  Will check BMP today, consider adding spironolactone.  Follow-up in 3 weeks. - olmesartan (BENICAR) 40 MG tablet; Take 1 tablet (40 mg total) by mouth daily.  Dispense: 30 tablet; Refill: 0 - CBC; Future - Basic Metabolic Panel; Future - Basic Metabolic Panel - CBC  2. Anemia, unspecified type  - CBC; Future - IBC + Ferritin; Future - IBC + Ferritin - CBC  3. Vitamin D  deficiency  - VITAMIN D  25 Hydroxy (Vit-D Deficiency, Fractures); Future - VITAMIN D  25 Hydroxy (Vit-D Deficiency, Fractures)  4. Depression, major, single episode, moderate (HCC) - Will increase Effexor  to 225mg . Follow up in 3 weeks  - venlafaxine  XR (EFFEXOR  XR) 75 MG 24 hr capsule; Take with 150 mg dose  Dispense: 90 capsule; Refill: 1  5. Gait instability -She is going to speak to her the prescriber giving her methadone  for opiate abuse to see if the dose could be lowered which might  provide some decreased dizziness and gait instability. - Paper prescription given to pick up rolling walker with seat from medical supply store - Ambulatory referral to Physical Therapy  6. Mass of right forearm  - MR FOREARM RIGHT WO CONTRAST; Future  7. Lower extremity edema - She does report taking Lasix  20 mg daily.  Continues to have lower extremity edema.  Will check BMP today and consider adding spironolactone - Basic Metabolic Panel; Future - Basic Metabolic Panel  Alto Atta, NP  Time spent with patient today was 45 minutes which consisted of chart review, discussing HTN, right arm mass, anemia, gait instability, lower extremity edema, depression and vitamin D  deficiency,  work up, treatment answering questions and documentation.

## 2023-06-12 ENCOUNTER — Other Ambulatory Visit: Payer: Self-pay | Admitting: Adult Health

## 2023-06-12 MED ORDER — VITAMIN D (ERGOCALCIFEROL) 1.25 MG (50000 UNIT) PO CAPS: 50000.0000 [IU] | ORAL_CAPSULE | ORAL | 1 refills | Status: AC

## 2023-06-12 MED ORDER — SPIRONOLACTONE 25 MG PO TABS: 25.0000 mg | ORAL_TABLET | Freq: Every day | ORAL | 0 refills | Status: DC

## 2023-06-13 ENCOUNTER — Other Ambulatory Visit: Payer: Self-pay | Admitting: Adult Health

## 2023-06-13 DIAGNOSIS — M5416 Radiculopathy, lumbar region: Secondary | ICD-10-CM

## 2023-06-13 DIAGNOSIS — S22070A Wedge compression fracture of T9-T10 vertebra, initial encounter for closed fracture: Secondary | ICD-10-CM

## 2023-06-13 DIAGNOSIS — M5412 Radiculopathy, cervical region: Secondary | ICD-10-CM

## 2023-06-16 ENCOUNTER — Ambulatory Visit
Admission: RE | Admit: 2023-06-16 | Discharge: 2023-06-16 | Disposition: A | Discharge status: Home / Self Care | Source: Ambulatory Visit | Attending: Adult Health | Admitting: Adult Health

## 2023-06-16 ENCOUNTER — Other Ambulatory Visit

## 2023-06-16 DIAGNOSIS — R2231 Localized swelling, mass and lump, right upper limb: Secondary | ICD-10-CM

## 2023-06-19 ENCOUNTER — Encounter: Payer: Self-pay | Admitting: Adult Health

## 2023-06-20 NOTE — Telephone Encounter (Signed)
 Please advise if pt needs another appt. For this

## 2023-06-25 ENCOUNTER — Telehealth: Payer: Self-pay

## 2023-06-25 ENCOUNTER — Ambulatory Visit: Payer: Self-pay | Admitting: Adult Health

## 2023-06-25 NOTE — Telephone Encounter (Signed)
Unsuccessful attempts to reach patient on preferred number listed in notes for scheduled AWV. Left messages on voicemail okay to reschedule.

## 2023-07-02 ENCOUNTER — Encounter: Payer: Self-pay | Admitting: Adult Health

## 2023-07-02 ENCOUNTER — Ambulatory Visit (INDEPENDENT_AMBULATORY_CARE_PROVIDER_SITE_OTHER): Admitting: Adult Health

## 2023-07-02 VITALS — BP 148/76 | HR 60 | Temp 98.2°F | Ht 70.0 in | Wt 168.0 lb

## 2023-07-02 DIAGNOSIS — I10 Essential (primary) hypertension: Secondary | ICD-10-CM | POA: Diagnosis not present

## 2023-07-02 DIAGNOSIS — F321 Major depressive disorder, single episode, moderate: Secondary | ICD-10-CM

## 2023-07-02 LAB — BASIC METABOLIC PANEL WITH GFR
BUN: 22 mg/dL (ref 6–23)
CO2: 30 meq/L (ref 19–32)
Calcium: 8.9 mg/dL (ref 8.4–10.5)
Chloride: 101 meq/L (ref 96–112)
Creatinine, Ser: 1.28 mg/dL — ABNORMAL HIGH (ref 0.40–1.20)
GFR: 43.01 mL/min — ABNORMAL LOW (ref >60.00)
Glucose, Bld: 89 mg/dL (ref 70–99)
Potassium: 4.5 meq/L (ref 3.5–5.1)
Sodium: 138 meq/L (ref 135–145)

## 2023-07-02 MED ORDER — OLMESARTAN MEDOXOMIL 40 MG PO TABS
40.0000 mg | ORAL_TABLET | Freq: Every day | ORAL | 3 refills | Status: AC
Start: 2023-07-02 — End: ?

## 2023-07-02 MED ORDER — SPIRONOLACTONE 25 MG PO TABS: 25.0000 mg | ORAL_TABLET | Freq: Every day | ORAL | 3 refills | Status: AC

## 2023-07-02 NOTE — Patient Instructions (Addendum)
 Stop Losartan   Take Spirolatone every day with Olmesartan  and Metoprolol  - take these every morning.

## 2023-07-02 NOTE — Progress Notes (Signed)
 Subjective:    Patient ID: Laurie [name], female    DOB: 1955/04/03, 68 y.o.   MRN: [id number]  HPI 68 year old female who  has a past medical history of Allergy, Anxiety, Arthritis, Asthma, Chronic back pain, COPD (chronic obstructive pulmonary disease) (HCC), Depression, Esophageal stricture, GERD (gastroesophageal reflux disease), Heart murmur, Hematemesis (09/10/2018), Hyperlipidemia, Hypertension, IBS (irritable bowel syndrome), Interstitial cystitis, Neuromuscular disorder (HCC), and PONV (postoperative nausea and vomiting).  She presents to the office today for follow-up regarding multiple issues.  Hypertension -when she was seeing roughly 3 weeks ago she was noticing home blood pressure readings in the 170s to 200s over 90s to 100s despite taking her prescribed blood pressure medication.  During her last visit she was continued on metoprolol  50 mg XR, losartan  was discontinued and we added olmesartan  40 mg daily spironolactone  25 mg daily.  Today she reports that she did not stop Losartan  and has continued taking it with Olmesartan  and Metoprolol . She is taking her spirolactone every few days.   BP Readings from Last 3 Encounters:  07/02/23 (!) 148/76  06/11/23 (!) 190/90  06/06/23 (!) 150/80   Depression-she did not feel as though her depression was well-controlled spite taking Effexor  150 mg daily.  I increased her Effexor  to 225 mg daily. Today she reports that she is feeling better overall.   Review of Systems See HPI   Past Medical History:  Diagnosis Date   Allergy    Anxiety    Arthritis    RA   Asthma    Chronic back pain    COPD (chronic obstructive pulmonary disease) (HCC)    Depression    Esophageal stricture    GERD (gastroesophageal reflux disease)    Heart murmur    Hematemesis 09/10/2018   Hyperlipidemia    Hypertension    IBS (irritable bowel syndrome)    Interstitial cystitis    Neuromuscular disorder (HCC)    fibromyalgia   PONV  (postoperative nausea and vomiting)     Social History   Socioeconomic History   Marital status: Single    Spouse name: Not on file   Number of children: Not on file   Years of education: Not on file   Highest education level: Not on file  Occupational History   Occupation: disability  Tobacco Use   Smoking status: Every Day    Current packs/day: 0.50    Types: Cigarettes   Smokeless tobacco: Never   Tobacco comments:    trying to quit   Vaping Use   Vaping status: Never Used  Substance and Sexual Activity   Alcohol use: Never    Comment: occasional   Drug use: Yes    Types: Marijuana    Comment: occas   Sexual activity: Not on file  Other Topics Concern   Not on file  Social History Narrative   Not on file   Social Drivers of Health   Financial Resource Strain: Low Risk  (06/17/2022)   Overall Financial Resource Strain (CARDIA)    Difficulty of Paying Living Expenses: Not hard at all  Food Insecurity: No Food Insecurity (07/15/2022)   Hunger Vital Sign    Worried About Running Out of Food in the Last Year: Never true    Ran Out of Food in the Last Year: Never true  Transportation Needs: No Transportation Needs (06/17/2022)   PRAPARE - Administrator, Civil Service (Medical): No    Lack of Transportation (Non-Medical): No  Physical Activity: Sufficiently Active (06/17/2022)   Exercise Vital Sign    Days of Exercise per Week: 7 days    Minutes of Exercise per Session: 30 min  Stress: No Stress Concern Present (06/17/2022)   [employer] of Occupational Health - Occupational Stress Questionnaire    Feeling of Stress : Not at all  Social Connections: Socially Integrated (06/17/2022)   Social Connection and Isolation Panel [NHANES]    Frequency of Communication with Friends and Family: More than three times a week    Frequency of Social Gatherings with Friends and Family: More than three times a week    Attends Religious Services: More than 4 times per year     Active Member of Clubs or Organizations: Yes    Attends Banker Meetings: More than 4 times per year    Marital Status: Living with partner  Intimate Partner Violence: Not At Risk (06/17/2022)   Humiliation, Afraid, Rape, and Kick questionnaire    Fear of Current or Ex-Partner: No    Emotionally Abused: No    Physically Abused: No    Sexually Abused: No    Past Surgical History:  Procedure Laterality Date   ABDOMINAL HYSTERECTOMY     APPENDECTOMY     BIOPSY  09/11/2018   Procedure: BIOPSY;  Surgeon: Brice Campi, Albino Alu., MD;  Location: [facility] ENDOSCOPY;  Service: Gastroenterology;;   CERVICAL FUSION     CERVICAL LAMINECTOMY     CORONARY STENT INTERVENTION N/A 10/07/2019   Procedure: CORONARY STENT INTERVENTION;  Surgeon: Odie Benne, MD;  Location: [facility];  Service: Cardiovascular;  Laterality: N/A;   ESOPHAGOGASTRODUODENOSCOPY     ESOPHAGOGASTRODUODENOSCOPY (EGD) WITH PROPOFOL  N/A 09/11/2018   Procedure: ESOPHAGOGASTRODUODENOSCOPY (EGD) WITH PROPOFOL ;  Surgeon: Brice Campi Albino Alu., MD;  Location: [facility] ENDOSCOPY;  Service: Gastroenterology;  Laterality: N/A;   FINGER SURGERY     KYPHOPLASTY N/A 03/29/2021   Procedure: Lumbar Three KYPHOPLASTY;  Surgeon: Elna Haggis, MD;  Location: [facility] OR;  Service: Neurosurgery;  Laterality: N/A;   LEFT HEART CATH AND CORONARY ANGIOGRAPHY N/A 10/07/2019   Procedure: LEFT HEART CATH AND CORONARY ANGIOGRAPHY;  Surgeon: Odie Benne, MD;  Location: [facility];  Service: Cardiovascular;  Laterality: N/A;   LEFT HEART CATH AND CORONARY ANGIOGRAPHY N/A 06/20/2022   Procedure: LEFT HEART CATH AND CORONARY ANGIOGRAPHY;  Surgeon: Odie Benne, MD;  Location: [facility] LAB;  Service: Cardiovascular;  Laterality: N/A;   LUMBAR FUSION     SAVORY DILATION N/A 09/11/2018   Procedure: SAVORY DILATION;  Surgeon: Brice Campi Albino Alu., MD;  Location: [facility] ENDOSCOPY;  Service: Gastroenterology;   Laterality: N/A;   TONSILLECTOMY      Family History  Problem Relation Age of Onset   Dementia Mother    Heart attack Father    Coronary artery disease Father    Cancer Brother        esophageal   Esophageal cancer Brother    Coronary artery disease Paternal Aunt    Stomach cancer Paternal Aunt    Coronary artery disease Paternal Grandmother    Aneurysm Brother        aortic   Rectal cancer Neg Hx    Colon cancer Neg Hx    Thyroid  disease Neg Hx     Allergies  Allergen Reactions   Codeine Nausea And Vomiting   Lisinopril  Cough   Sulfonamide Derivatives Nausea And Vomiting   Tetracyclines & Related Rash    Current Outpatient Medications on  File Prior to Visit  Medication Sig Dispense Refill   albuterol  (VENTOLIN  HFA) 108 (90 Base) MCG/ACT inhaler INHALE 2 PUFFS BY MOUTH EVERY 6 HOURS AS NEEDED FOR SHORTNESS OF BREATH AND WHEEZING 8.5 g 10   aspirin  EC 81 MG tablet Take 1 tablet (81 mg total) by mouth daily. Swallow whole. 90 tablet 3   atorvastatin  (LIPITOR) 40 MG tablet TAKE 1 TABLET BY MOUTH EVERY EVENING 30 tablet 10   clopidogrel  (PLAVIX ) 75 MG tablet Take 1 tablet (75 mg total) by mouth daily. 90 tablet 3   fenofibrate  (TRICOR ) 145 MG tablet TAKE 1 TABLET BY MOUTH EVERY EVENING 30 tablet 10   fluticasone  (FLONASE ) 50 MCG/ACT nasal spray INSTILL TWO (2) SPRAYS IN EACH NOSTRIL DAILY 16 g 10   furosemide  (LASIX ) 20 MG tablet TAKE ONE TABLET BY MOUTH ONCE Daily 90 tablet 1   meclizine  (ANTIVERT ) 25 MG tablet Take 1 tablet (25 mg total) by mouth 3 (three) times daily as needed for dizziness. 30 tablet 0   methadone  (DOLOPHINE ) 10 MG/ML solution Take 80 mg by mouth daily.     methimazole  (TAPAZOLE ) 10 MG tablet Take 1 tablet (10 mg total) by mouth 2 (two) times daily. TAKE 1 TABLET BY MOUTH EACH EVENING 180 tablet 1   metoprolol  succinate (TOPROL -XL) 50 MG 24 hr tablet TAKE 1 TABLET BY MOUTH EVERY MORNING WITH OR IMMEDIATELY FOLLOWING A MEAL 30 tablet 10   ondansetron   (ZOFRAN ) 4 MG tablet Take 1 tablet (4 mg total) by mouth every 6 (six) hours. 12 tablet 0   ondansetron  (ZOFRAN -ODT) 8 MG disintegrating tablet PLACE 1 TABLET ON TONGUE AND ALLOW TO DISSOLVE EVERY 8 HOURS AS NEEDED FOR NAUSEA & VOMITING 20 tablet 10   pantoprazole  (PROTONIX ) 40 MG tablet TAKE 1 TABLET BY MOUTH EVERY MORNING 30 tablet 10   venlafaxine  XR (EFFEXOR  XR) 75 MG 24 hr capsule Take with 150 mg dose 90 capsule 1   venlafaxine  XR (EFFEXOR -XR) 150 MG 24 hr capsule TAKE 1 CAPSULE BY MOUTH EVERY MORNING 30 capsule 10   Vitamin D , Ergocalciferol , (DRISDOL ) 1.25 MG (50000 UNIT) CAPS capsule Take 1 capsule (50,000 Units total) by mouth every 7 (seven) days. 12 capsule 1   nitroGLYCERIN  (NITROSTAT ) 0.4 MG SL tablet Place 1 tablet (0.4 mg total) under the tongue every 5 (five) minutes as needed for chest pain. 25 tablet 3   No current facility-administered medications on file prior to visit.    BP (!) 148/76   Pulse 60   Temp 98.2 F (36.8 C) (Oral)   Ht 5\' 10"  (1.778 m)   Wt 168 lb (76.2 kg)   SpO2 98%   BMI 24.11 kg/m       Objective:   Physical Exam Vitals reviewed.  Constitutional:      Appearance: Normal appearance.  Cardiovascular:     Rate and Rhythm: Normal rate and regular rhythm.     Pulses: Normal pulses.     Heart sounds: Normal heart sounds.  Pulmonary:     Effort: Pulmonary effort is normal.     Breath sounds: Normal breath sounds.  Musculoskeletal:     Right lower leg: No edema.     Left lower leg: No edema.  Skin:    General: Skin is warm and dry.     Capillary Refill: Capillary refill takes less than 2 seconds.  Neurological:     General: No focal deficit present.     Mental Status: She is alert and oriented to  person, place, and time.  Psychiatric:        Mood and Affect: Mood normal.        Behavior: Behavior normal.        Thought Content: Thought content normal.        Judgment: Judgment normal.         Assessment & Plan:  1. Essential  hypertension (Primary) - improved but not at goal  - Advised to stop Losartan  and take Benicar , Metoprolol  and Spirolactone every morning.  - Follow up in 30 day  - Basic Metabolic Panel; Future - olmesartan  (BENICAR ) 40 MG tablet; Take 1 tablet (40 mg total) by mouth daily.  Dispense: 90 tablet; Refill: 3 - spironolactone  (ALDACTONE ) 25 MG tablet; Take 1 tablet (25 mg total) by mouth daily.  Dispense: 90 tablet; Refill: 3  2. Depression, major, single episode, moderate (HCC) - Continue with Effexor  225 mg daily   Daquarius Dubeau, NP

## 2023-07-03 ENCOUNTER — Ambulatory Visit: Payer: Self-pay | Admitting: Adult Health

## 2023-07-08 ENCOUNTER — Other Ambulatory Visit: Payer: Self-pay | Admitting: Adult Health

## 2023-07-08 DIAGNOSIS — I10 Essential (primary) hypertension: Secondary | ICD-10-CM

## 2023-07-23 ENCOUNTER — Other Ambulatory Visit: Payer: Self-pay

## 2023-07-30 ENCOUNTER — Other Ambulatory Visit

## 2023-07-30 DIAGNOSIS — M5412 Radiculopathy, cervical region: Secondary | ICD-10-CM | POA: Diagnosis not present

## 2023-08-01 ENCOUNTER — Encounter: Payer: Self-pay | Admitting: Adult Health

## 2023-08-01 ENCOUNTER — Ambulatory Visit
Admission: RE | Admit: 2023-08-01 | Discharge: 2023-08-01 | Disposition: A | Discharge status: Home / Self Care | Source: Ambulatory Visit | Attending: Adult Health | Admitting: Adult Health

## 2023-08-01 ENCOUNTER — Ambulatory Visit: Admitting: Adult Health

## 2023-08-01 VITALS — BP 138/76 | HR 71 | Temp 98.2°F | Ht 70.0 in | Wt 167.0 lb

## 2023-08-01 DIAGNOSIS — H532 Diplopia: Secondary | ICD-10-CM | POA: Diagnosis not present

## 2023-08-01 DIAGNOSIS — S0990XA Unspecified injury of head, initial encounter: Secondary | ICD-10-CM

## 2023-08-01 DIAGNOSIS — S0003XA Contusion of scalp, initial encounter: Secondary | ICD-10-CM | POA: Diagnosis not present

## 2023-08-01 DIAGNOSIS — J0140 Acute pansinusitis, unspecified: Secondary | ICD-10-CM

## 2023-08-01 DIAGNOSIS — Z01818 Encounter for other preprocedural examination: Secondary | ICD-10-CM

## 2023-08-01 DIAGNOSIS — R2681 Unsteadiness on feet: Secondary | ICD-10-CM | POA: Diagnosis not present

## 2023-08-01 DIAGNOSIS — I10 Essential (primary) hypertension: Secondary | ICD-10-CM

## 2023-08-01 LAB — BASIC METABOLIC PANEL WITH GFR
BUN: 23 mg/dL (ref 6–23)
CO2: 31 meq/L (ref 19–32)
Calcium: 9.6 mg/dL (ref 8.4–10.5)
Chloride: 102 meq/L (ref 96–112)
Creatinine, Ser: 1.25 mg/dL — ABNORMAL HIGH (ref 0.40–1.20)
GFR: 44.23 mL/min — ABNORMAL LOW (ref >60.00)
Glucose, Bld: 85 mg/dL (ref 70–99)
Potassium: 4.6 meq/L (ref 3.5–5.1)
Sodium: 138 meq/L (ref 135–145)

## 2023-08-01 MED ORDER — AMOXICILLIN-POT CLAVULANATE 875-125 MG PO TABS: 1.0000 | ORAL_TABLET | Freq: Two times a day (BID) | ORAL | 0 refills | Status: DC

## 2023-08-01 NOTE — Progress Notes (Signed)
 Subjective:    Patient ID: Laurie [name], female    DOB: 09/10/1955, 68 y.o.   MRN: [id number]  HPI  68 year old female who  has a past medical history of Allergy, Anxiety, Arthritis, Asthma, Chronic back pain, COPD (chronic obstructive pulmonary disease) (HCC), Depression, Esophageal stricture, GERD (gastroesophageal reflux disease), Heart murmur, Hematemesis (09/10/2018), Hyperlipidemia, Hypertension, IBS (irritable bowel syndrome), Interstitial cystitis, Neuromuscular disorder (HCC), and PONV (postoperative nausea and vomiting).  She presents to the office today for follow-up regarding hypertension.  When she was last seen about a month ago we had recently switched her from losartan  to olmesartan  and she was kept on metoprolol  D milligrams extended release and spironolactone  25 mg daily.  When she followed up in the office she reported that she she was taking olmesartan  40 mg with metoprolol  and spironolactone  but never stopped her losartan  dose.  I had her stop losartan  and continue with the other 3 blood pressure medications. Today she reports that she has not been checking her blood pressure at home consistently but does report  it is down.   BP Readings from Last 3 Encounters:  08/01/23 138/76  07/02/23 (!) 148/76  06/11/23 (!) 190/90   In the interim she is also been seen by [facility] and has decided to undergo surgical decompression of C4-C5. She needs surgical clearance.  Unfortunately she reports that this morning she fell in her bathroom around 3 AM, when she fell she fell forward hitting her left forehead and nose.  She reports that taking her Plavix  today or yesterday but did take her Plavix  a day before.  She did not have any bleeding.  Has a mild headache and blurred vision that is not new.  In regards to her blurred vision she does report that she saw an ophthalmologist but did not care for the bedside manner of the ophthalmologist and was not informed of  anything.  She would like to see a new eye doctor.  Furthermore she reports that for the last few weeks she has been experiencing sinus pressure, nasal congestion and rhinorrhea with discolored drainage.  She has not had any fevers or chills.  Review of Systems See HPI   Past Medical History:  Diagnosis Date   Allergy    Anxiety    Arthritis    RA   Asthma    Chronic back pain    COPD (chronic obstructive pulmonary disease) (HCC)    Depression    Esophageal stricture    GERD (gastroesophageal reflux disease)    Heart murmur    Hematemesis 09/10/2018   Hyperlipidemia    Hypertension    IBS (irritable bowel syndrome)    Interstitial cystitis    Neuromuscular disorder (HCC)    fibromyalgia   PONV (postoperative nausea and vomiting)     Social History   Socioeconomic History   Marital status: Single    Spouse name: Not on file   Number of children: Not on file   Years of education: Not on file   Highest education level: Not on file  Occupational History   Occupation: disability  Tobacco Use   Smoking status: Every Day    Current packs/day: 0.50    Types: Cigarettes   Smokeless tobacco: Never   Tobacco comments:    trying to quit   Vaping Use   Vaping status: Never Used  Substance and Sexual Activity   Alcohol use: Never    Comment: occasional   Drug  use: Yes    Types: Marijuana    Comment: occas   Sexual activity: Not on file  Other Topics Concern   Not on file  Social History Narrative   Not on file   Social Drivers of Health   Financial Resource Strain: Low Risk  (06/17/2022)   Overall Financial Resource Strain (CARDIA)    Difficulty of Paying Living Expenses: Not hard at all  Food Insecurity: No Food Insecurity (07/15/2022)   Hunger Vital Sign    Worried About Running Out of Food in the Last Year: Never true    Ran Out of Food in the Last Year: Never true  Transportation Needs: No Transportation Needs (06/17/2022)   PRAPARE - Scientist, research (physical sciences) (Medical): No    Lack of Transportation (Non-Medical): No  Physical Activity: Sufficiently Active (06/17/2022)   Exercise Vital Sign    Days of Exercise per Week: 7 days    Minutes of Exercise per Session: 30 min  Stress: No Stress Concern Present (06/17/2022)   [employer] of Occupational Health - Occupational Stress Questionnaire    Feeling of Stress : Not at all  Social Connections: Socially Integrated (06/17/2022)   Social Connection and Isolation Panel    Frequency of Communication with Friends and Family: More than three times a week    Frequency of Social Gatherings with Friends and Family: More than three times a week    Attends Religious Services: More than 4 times per year    Active Member of Clubs or Organizations: Yes    Attends Banker Meetings: More than 4 times per year    Marital Status: Living with partner  Intimate Partner Violence: Not At Risk (06/17/2022)   Humiliation, Afraid, Rape, and Kick questionnaire    Fear of Current or Ex-Partner: No    Emotionally Abused: No    Physically Abused: No    Sexually Abused: No    Past Surgical History:  Procedure Laterality Date   ABDOMINAL HYSTERECTOMY     APPENDECTOMY     BIOPSY  09/11/2018   Procedure: BIOPSY;  Surgeon: Brice Campi, Albino Alu., MD;  Location: [facility];  Service: Gastroenterology;;   CERVICAL FUSION     CERVICAL LAMINECTOMY     CORONARY STENT INTERVENTION N/A 10/07/2019   Procedure: CORONARY STENT INTERVENTION;  Surgeon: Odie Benne, MD;  Location: [facility];  Service: Cardiovascular;  Laterality: N/A;   ESOPHAGOGASTRODUODENOSCOPY     ESOPHAGOGASTRODUODENOSCOPY (EGD) WITH PROPOFOL  N/A 09/11/2018   Procedure: ESOPHAGOGASTRODUODENOSCOPY (EGD) WITH PROPOFOL ;  Surgeon: Brice Campi Albino Alu., MD;  Location: [facility] ENDOSCOPY;  Service: Gastroenterology;  Laterality: N/A;   FINGER SURGERY     KYPHOPLASTY N/A 03/29/2021   Procedure: Lumbar Three KYPHOPLASTY;   Surgeon: Elna Haggis, MD;  Location[facility];  Service: Neurosurgery;  Laterality: N/A;   LEFT HEART CATH AND CORONARY ANGIOGRAPHY N/A 10/07/2019   Procedure: LEFT HEART CATH AND CORONARY ANGIOGRAPHY;  Surgeon: Odie Benne, MD;  Location: [facility];  Service: Cardiovascular;  Laterality: N/A;   LEFT HEART CATH AND CORONARY ANGIOGRAPHY N/A 06/20/2022   Procedure: LEFT HEART CATH AND CORONARY ANGIOGRAPHY;  Surgeon: Odie Benne, MD;  Location: [facility];  Service: Cardiovascular;  Laterality: N/A;   LUMBAR FUSION     SAVORY DILATION N/A 09/11/2018   Procedure: SAVORY DILATION;  Surgeon: Brice Campi Albino Alu., MD;  Location: [facility] ENDOSCOPY;  Service: Gastroenterology;  Laterality: N/A;   TONSILLECTOMY  Family History  Problem Relation Age of Onset   Dementia Mother    Heart attack Father    Coronary artery disease Father    Cancer Brother        esophageal   Esophageal cancer Brother    Coronary artery disease Paternal Aunt    Stomach cancer Paternal Aunt    Coronary artery disease Paternal Grandmother    Aneurysm Brother        aortic   Rectal cancer Neg Hx    Colon cancer Neg Hx    Thyroid  disease Neg Hx     Allergies  Allergen Reactions   Codeine Nausea And Vomiting   Lisinopril  Cough   Sulfonamide Derivatives Nausea And Vomiting   Tetracyclines & Related Rash    Current Outpatient Medications on File Prior to Visit  Medication Sig Dispense Refill   albuterol  (VENTOLIN  HFA) 108 (90 Base) MCG/ACT inhaler INHALE 2 PUFFS BY MOUTH EVERY 6 HOURS AS NEEDED FOR SHORTNESS OF BREATH AND WHEEZING 8.5 g 10   aspirin  EC 81 MG tablet Take 1 tablet (81 mg total) by mouth daily. Swallow whole. 90 tablet 3   atorvastatin  (LIPITOR) 40 MG tablet TAKE 1 TABLET BY MOUTH EVERY EVENING 30 tablet 10   clopidogrel  (PLAVIX ) 75 MG tablet Take 1 tablet (75 mg total) by mouth daily. 90 tablet 3   fenofibrate  (TRICOR ) 145 MG tablet TAKE 1 TABLET BY MOUTH EVERY  EVENING 30 tablet 10   fluticasone  (FLONASE ) 50 MCG/ACT nasal spray INSTILL TWO (2) SPRAYS IN EACH NOSTRIL DAILY 16 g 10   furosemide  (LASIX ) 20 MG tablet TAKE ONE TABLET BY MOUTH ONCE Daily 90 tablet 1   meclizine  (ANTIVERT ) 25 MG tablet Take 1 tablet (25 mg total) by mouth 3 (three) times daily as needed for dizziness. 30 tablet 0   methadone  (DOLOPHINE ) 10 MG/ML solution Take 80 mg by mouth daily.     methimazole  (TAPAZOLE ) 10 MG tablet Take 1 tablet (10 mg total) by mouth 2 (two) times daily. TAKE 1 TABLET BY MOUTH EACH EVENING 180 tablet 1   metoprolol  succinate (TOPROL -XL) 50 MG 24 hr tablet TAKE 1 TABLET BY MOUTH EVERY MORNING WITH OR IMMEDIATELY FOLLOWING A MEAL 30 tablet 10   nitroGLYCERIN  (NITROSTAT ) 0.4 MG SL tablet Place 1 tablet (0.4 mg total) under the tongue every 5 (five) minutes as needed for chest pain. 25 tablet 3   olmesartan  (BENICAR ) 40 MG tablet Take 1 tablet (40 mg total) by mouth daily. 90 tablet 3   pantoprazole  (PROTONIX ) 40 MG tablet TAKE 1 TABLET BY MOUTH EVERY MORNING 30 tablet 10   spironolactone  (ALDACTONE ) 25 MG tablet Take 1 tablet (25 mg total) by mouth daily. 90 tablet 3   venlafaxine  XR (EFFEXOR  XR) 75 MG 24 hr capsule Take with 150 mg dose 90 capsule 1   venlafaxine  XR (EFFEXOR -XR) 150 MG 24 hr capsule TAKE 1 CAPSULE BY MOUTH EVERY MORNING 30 capsule 10   Vitamin D , Ergocalciferol , (DRISDOL ) 1.25 MG (50000 UNIT) CAPS capsule Take 1 capsule (50,000 Units total) by mouth every 7 (seven) days. 12 capsule 1   No current facility-administered medications on file prior to visit.    BP 138/76   Pulse 71   Temp 98.2 F (36.8 C) (Oral)   Ht 5' 10 (1.778 m)   Wt 167 lb (75.8 kg)   SpO2 98%   BMI 23.96 kg/m       Objective:   Physical Exam Vitals and nursing note reviewed.  Constitutional:      Appearance: Normal appearance.  HENT:     Head: No raccoon eyes or Battle's sign.      Right Ear: No hemotympanum.     Left Ear: No hemotympanum.     Nose:  Congestion and rhinorrhea present. Rhinorrhea is purulent.     Right Turbinates: Enlarged and swollen.     Left Turbinates: Enlarged and swollen.     Right Sinus: Maxillary sinus tenderness and frontal sinus tenderness present.     Left Sinus: Maxillary sinus tenderness and frontal sinus tenderness present.   Cardiovascular:     Rate and Rhythm: Normal rate and regular rhythm.     Pulses: Normal pulses.     Heart sounds: Normal heart sounds.  Pulmonary:     Effort: Pulmonary effort is normal.     Breath sounds: Normal breath sounds.   Musculoskeletal:        General: Normal range of motion.   Skin:    General: Skin is warm and dry.     Findings: Bruising present.   Neurological:     General: No focal deficit present.     Mental Status: She is alert and oriented to person, place, and time.   Psychiatric:        Mood and Affect: Mood normal.        Behavior: Behavior normal.        Thought Content: Thought content normal.        Judgment: Judgment normal.        Assessment & Plan:  1. Essential hypertension (Primary) - Better controlled. No change in medication at this time - Basic Metabolic Panel; Future - Basic Metabolic Panel  2. Preoperative clearance - I am unable to clear her for surgery at this time. Will need to wait for head CT to come back   3. Injury of head, initial encounter  - CT HEAD WO CONTRAST ( ); Future  4. Double vision - Ongoing issue. Not from recent head injury  - Ambulatory referral to Ophthalmology  5. Gait instability - I again pleaded with her to start using her walker.  - Hold Plavix  for the time being due to frequent falls  6. Acute non-recurrent pansinusitis  - amoxicillin -clavulanate (AUGMENTIN ) 875-125 MG tablet; Take 1 tablet by mouth 2 (two) times daily.  Dispense: 20 tablet; Refill: 0  Alto Atta, NP

## 2023-08-04 ENCOUNTER — Ambulatory Visit: Admitting: "Endocrinology

## 2023-08-05 ENCOUNTER — Ambulatory Visit: Payer: Self-pay | Admitting: Adult Health

## 2023-08-06 ENCOUNTER — Other Ambulatory Visit: Payer: Self-pay | Admitting: Adult Health

## 2023-08-06 MED ORDER — AZITHROMYCIN 250 MG PO TABS: ORAL_TABLET | ORAL | 0 refills | Status: AC

## 2023-08-15 IMAGING — CT CT L SPINE W/O CM
3 of 4 series · 10 of 33 positions shown, 12 images · non-contrast
Comparison: Radiographs earlier today.  Lumbar MRI 08/30/2019.

CLINICAL DATA: 65-year-old female status post fall yesterday with
continued pain and L3 compression fracture on lumbar radiographs
today.



[Series 5: coronal bone · coronal · 0.29mm/px · 3 of 71 slices shown]
[im 15/71  bone]
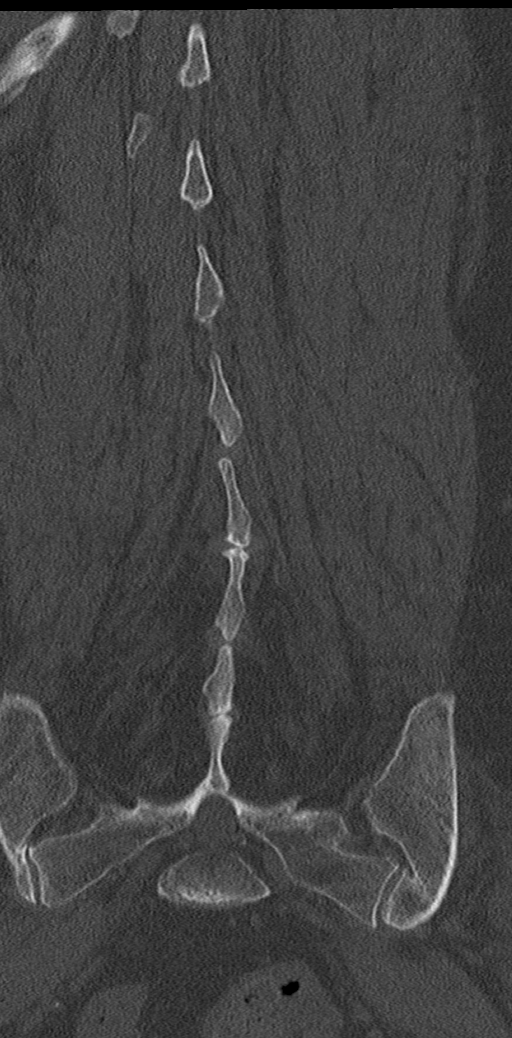
[im 29/71  bone]
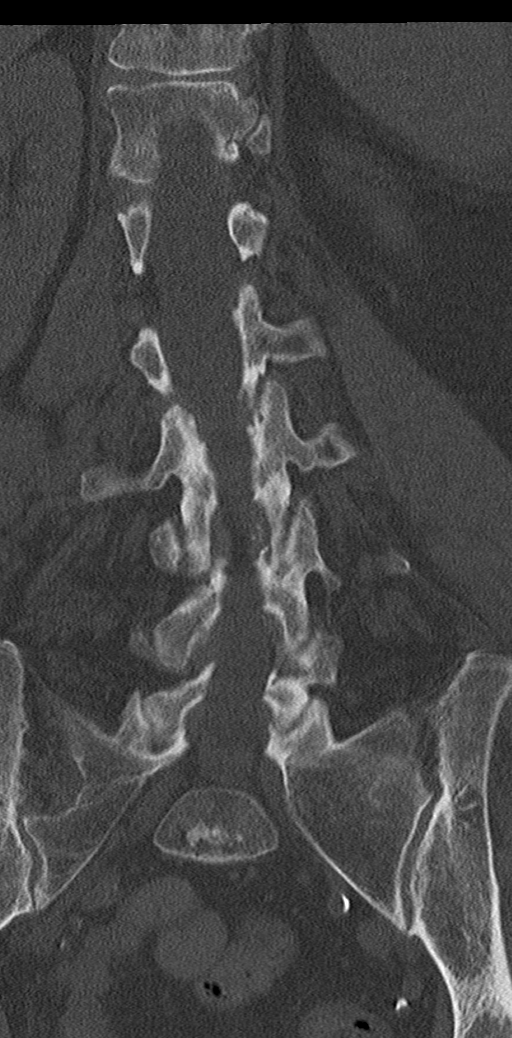
[im 43/71  bone]
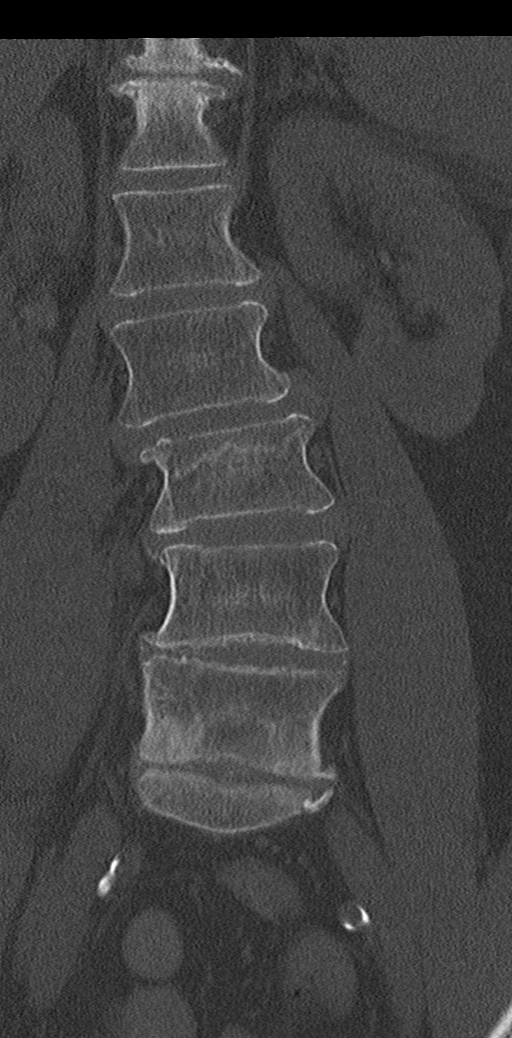

[Series 6: sagittal bone · sagittal · 0.39mm/px · 5 of 79 slices shown, 6 images]
[im 27/79  bone]
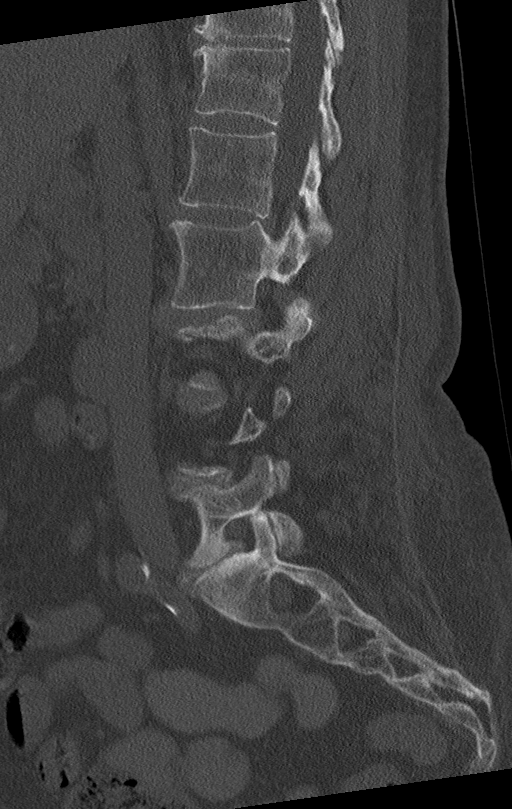
[im 33/79  bone]
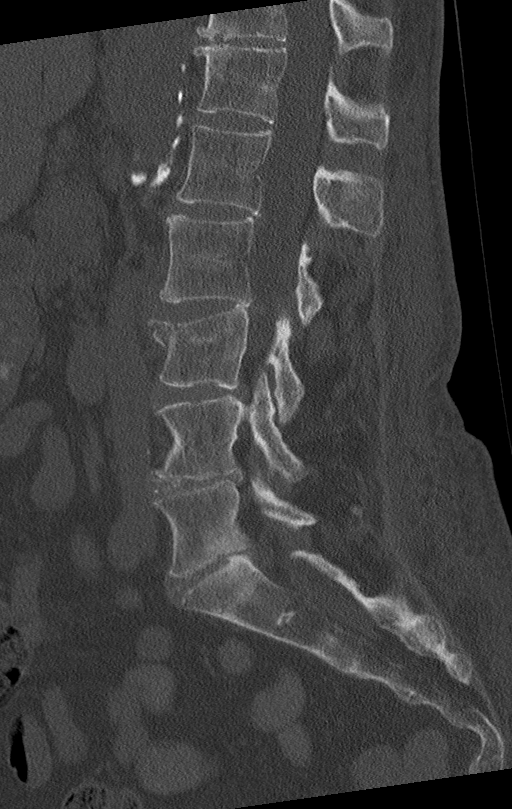
[im 40/79  soft-tissue]
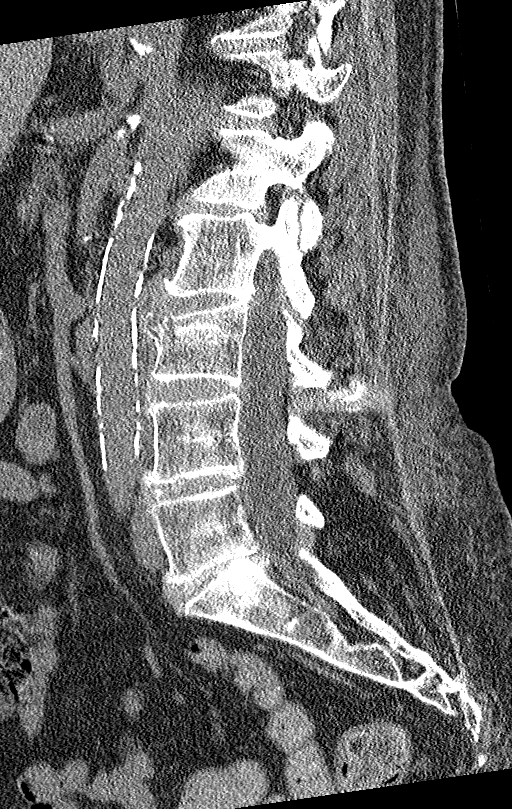
[im 40/79  bone]
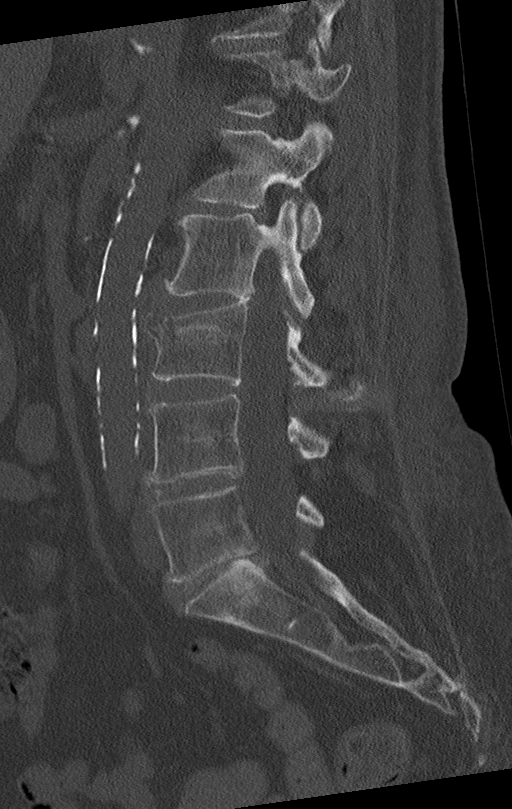
[im 46/79  bone]
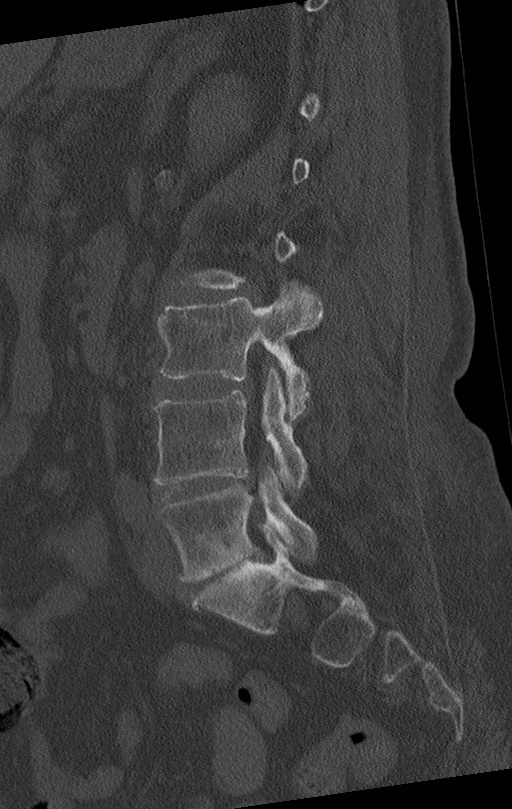
[im 53/79  bone]
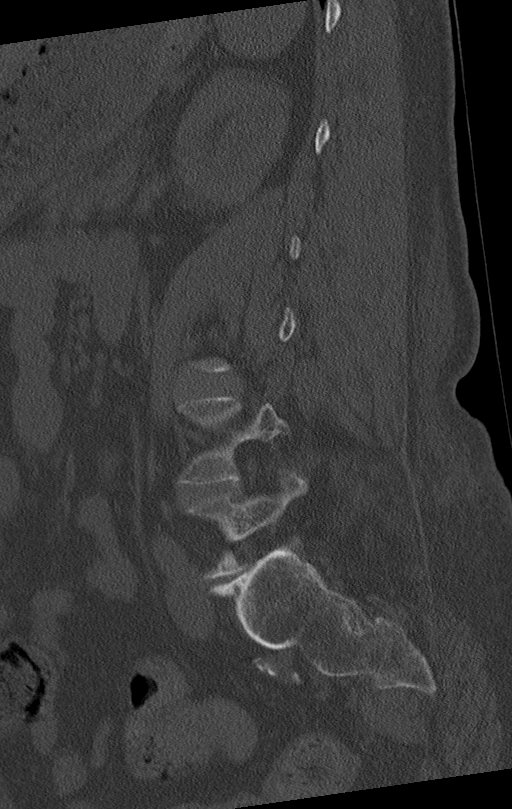

[Series 7: ax disc · axial · 0.27mm/px · z∈[+80,+179]mm · 2 of 119 slices shown, 3 images]
[im 48/119  soft-tissue]
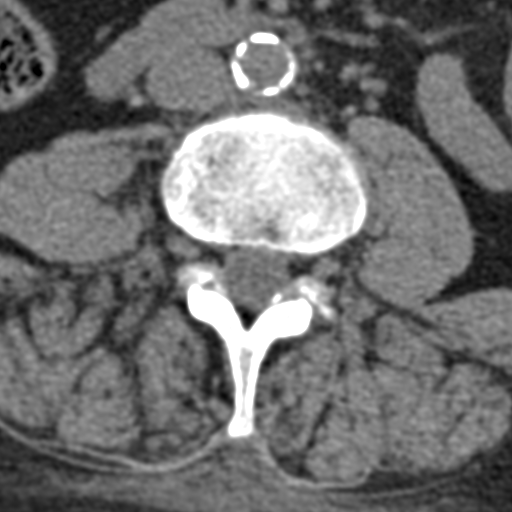
[im 48/119  bone]
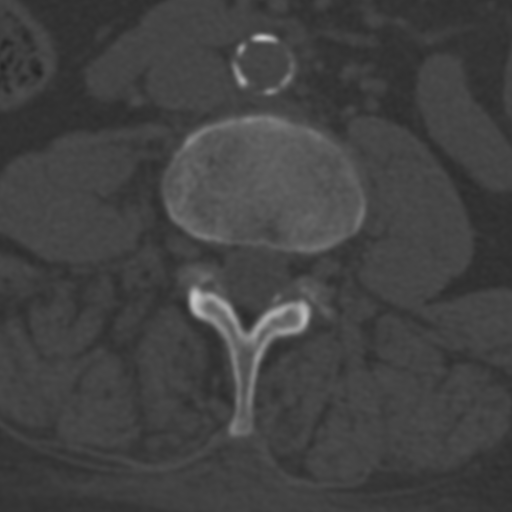
[im 95/119  bone]
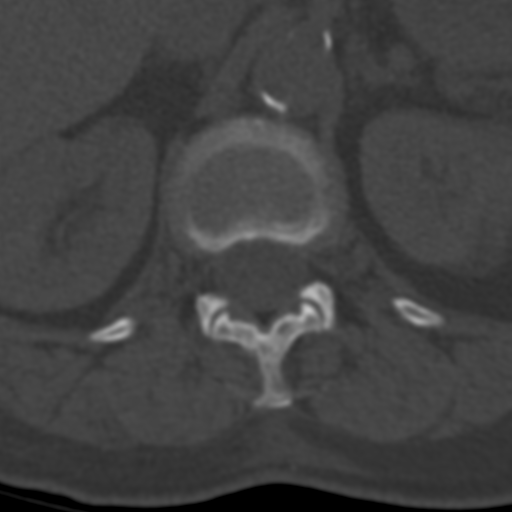

[10 of 33 positions shown; findings below may reference images not displayed]

FINDINGS: Segmentation: Normal.

Alignment: Chronic levoconvex lumbar scoliosis with straightening of
lumbar lordosis. Chronic mild retrolisthesis of L5 on S1 is stable
since 4841.

Vertebrae: Visible posterior ribs and lower thoracic levels appear
intact. L1 and L2 appears stable and intact.

Mildly comminuted L3 superior endplate compression fracture with
impaction. Un healed anterior superior endplate fracture lucency
(sagittal image 31 and series 3, image 60). Anterior wedging with
approximately 25% loss of anterior vertebral body height. No
significant retropulsion. L3 pedicles and posterior elements appear
intact.

L4 and L5 levels appear stable and intact. Visible sacrum, SI joints
and iliac bones appear intact.

Paraspinal and other soft tissues: Normal caliber abdominal aorta.
Aortoiliac calcified atherosclerosis. Some postcontrast renal
enhancement and contrast excretion with no adverse features.
Negative visible other abdominal viscera. Minor paraspinal edema or
contusion anteriorly at L3.

Disc levels: Underlying Capacious lumbar spinal canal. Lumbar disc
and endplate degeneration appears stable since 4841 except at L1-L2
where there is new circumferential disc bulging in conjunction with
the compression fracture. Disc material is eccentric to the right.
Mild new spinal and moderate right lateral recess stenosis at that
level (right L3 nerve level). No convincing foraminal stenosis.
IMPRESSION: 1. Mildly comminuted L3 superior endplate compression fracture with
anterior wedging, 25% loss of anterior vertebral body height. No
significant retropulsion, but new circumferential L2-L3 disc bulging
eccentric to the right results in mild spinal and moderate right
lateral recess stenosis which is new from a 4841 MRI.

2. No other acute osseous abnormality identified. Underlying
capacious lumbar spinal canal. Other lumbar disc and endplate
degeneration appears stable since [DATE]. Aortic Atherosclerosis ([id number]).

## 2023-08-17 IMAGING — RF DG LUMBAR SPINE 2-3V
2 series · 2 of 2 positions shown · non-contrast
Comparison: March 27, 2021.

CLINICAL DATA: Post kyphoplasty.

EXAM:
LUMBAR SPINE - 2-3 VIEW; DG C-ARM 1-60 MIN-NO REPORT

[Series 1: run · 1 of 1 slices shown (1 of 2)]
[im 1/1]
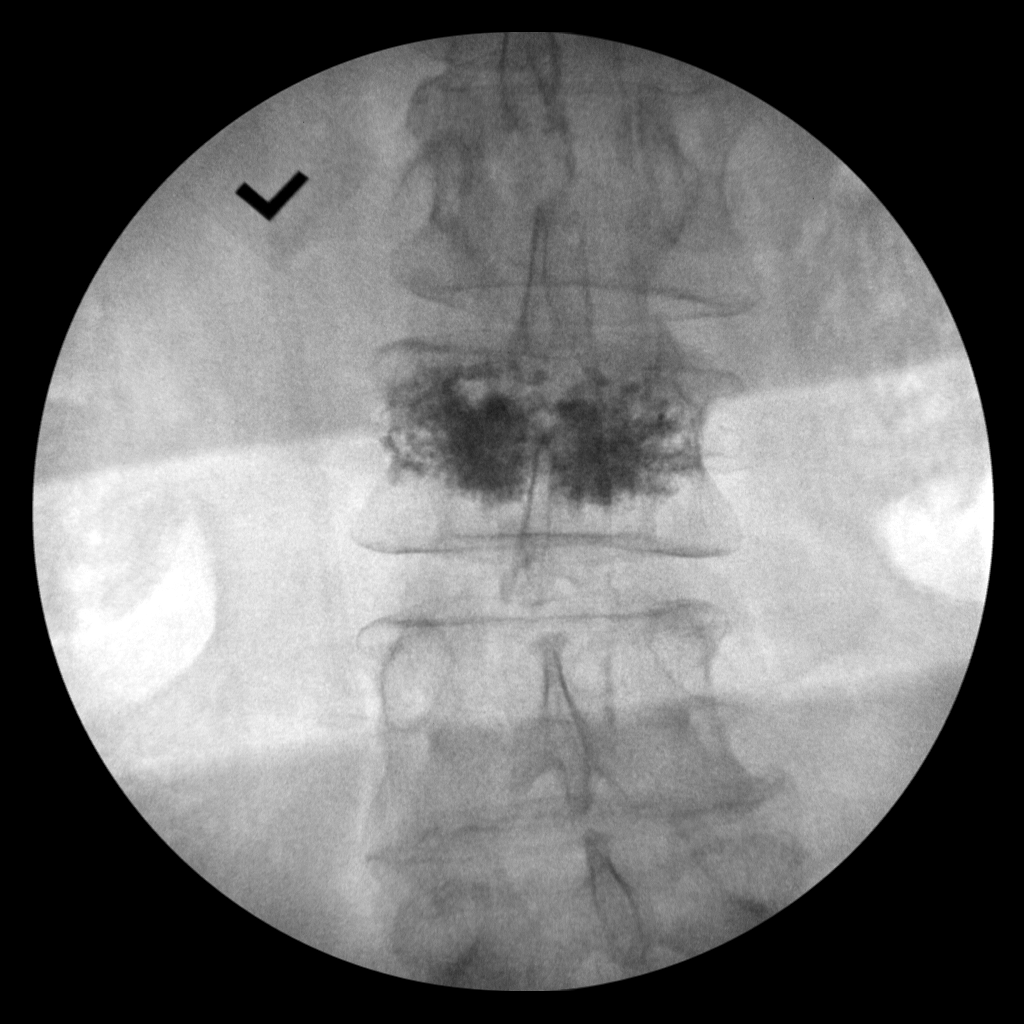

[Series 1: run · 1 of 1 slices shown (2 of 2)]
[im 1/1]
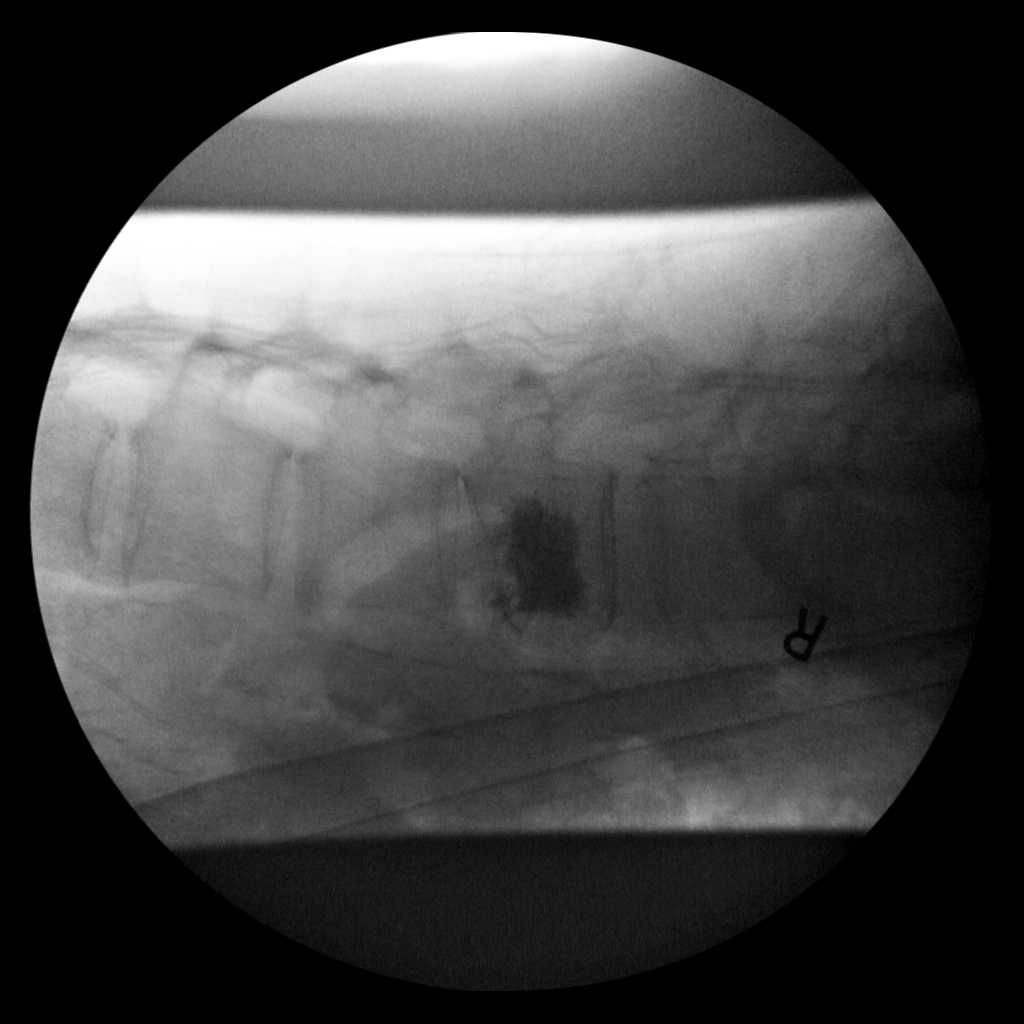

[2 of 2 positions shown; findings below may reference images not displayed]

FINDINGS: Limited coned in fluoroscopic views of the lumbar spine are
presented making leveling difficult. Morphology of the vertebra
matches the L3 vertebral level on previous imaging. Dense cement
within the vertebral body is noted without extension beyond the
cortex on frontal and lateral views. Loss of height is similar to
the prior exam.

Fluoroscopy dose: 33.5 mGy

Fluoro time: 1 minute 9 seconds
IMPRESSION: 1. Post kyphoplasty as discussed with limited intraoperative
fluoroscopic assessment.

## 2023-08-20 ENCOUNTER — Emergency Department (HOSPITAL_BASED_OUTPATIENT_CLINIC_OR_DEPARTMENT_OTHER): Admitting: Radiology

## 2023-08-20 ENCOUNTER — Encounter (HOSPITAL_BASED_OUTPATIENT_CLINIC_OR_DEPARTMENT_OTHER): Payer: Self-pay

## 2023-08-20 ENCOUNTER — Emergency Department (HOSPITAL_BASED_OUTPATIENT_CLINIC_OR_DEPARTMENT_OTHER)
Admission: EM | Admit: 2023-08-20 | Discharge: 2023-08-20 | Disposition: A | Discharge status: Home / Self Care | Attending: Emergency Medicine | Admitting: Emergency Medicine

## 2023-08-20 ENCOUNTER — Other Ambulatory Visit: Payer: Self-pay

## 2023-08-20 DIAGNOSIS — I2510 Atherosclerotic heart disease of native coronary artery without angina pectoris: Secondary | ICD-10-CM | POA: Insufficient documentation

## 2023-08-20 DIAGNOSIS — S6991XA Unspecified injury of right wrist, hand and finger(s), initial encounter: Secondary | ICD-10-CM | POA: Diagnosis present

## 2023-08-20 DIAGNOSIS — W010XXA Fall on same level from slipping, tripping and stumbling without subsequent striking against object, initial encounter: Secondary | ICD-10-CM | POA: Insufficient documentation

## 2023-08-20 DIAGNOSIS — Z7902 Long term (current) use of antithrombotics/antiplatelets: Secondary | ICD-10-CM | POA: Diagnosis not present

## 2023-08-20 DIAGNOSIS — Z79899 Other long term (current) drug therapy: Secondary | ICD-10-CM | POA: Diagnosis not present

## 2023-08-20 DIAGNOSIS — J449 Chronic obstructive pulmonary disease, unspecified: Secondary | ICD-10-CM | POA: Diagnosis not present

## 2023-08-20 DIAGNOSIS — I10 Essential (primary) hypertension: Secondary | ICD-10-CM | POA: Diagnosis not present

## 2023-08-20 DIAGNOSIS — S52611A Displaced fracture of right ulna styloid process, initial encounter for closed fracture: Secondary | ICD-10-CM | POA: Diagnosis not present

## 2023-08-20 DIAGNOSIS — S52591A Other fractures of lower end of right radius, initial encounter for closed fracture: Secondary | ICD-10-CM | POA: Diagnosis not present

## 2023-08-20 DIAGNOSIS — S62101A Fracture of unspecified carpal bone, right wrist, initial encounter for closed fracture: Secondary | ICD-10-CM

## 2023-08-20 DIAGNOSIS — S52501A Unspecified fracture of the lower end of right radius, initial encounter for closed fracture: Secondary | ICD-10-CM | POA: Diagnosis not present

## 2023-08-20 DIAGNOSIS — Z7982 Long term (current) use of aspirin: Secondary | ICD-10-CM | POA: Diagnosis not present

## 2023-08-20 MED ORDER — HYDROCODONE-ACETAMINOPHEN 5-325 MG PO TABS: 1.0000 | ORAL_TABLET | Freq: Once | ORAL | Status: DC

## 2023-08-20 MED ORDER — OXYCODONE-ACETAMINOPHEN 5-325 MG PO TABS
1.0000 | ORAL_TABLET | Freq: Once | ORAL | Status: AC
  Administered 2023-08-20: 1 via ORAL
  Filled 2023-08-20: qty 1

## 2023-08-20 MED ORDER — HYDROCODONE-ACETAMINOPHEN 5-325 MG PO TABS
1.0000 | ORAL_TABLET | Freq: Once | ORAL | Status: AC
  Administered 2023-08-20: 1 via ORAL
  Filled 2023-08-20: qty 1

## 2023-08-20 MED ORDER — HYDROCODONE-ACETAMINOPHEN 5-325 MG PO TABS: 1.0000 | ORAL_TABLET | Freq: Four times a day (QID) | ORAL | 0 refills | Status: AC | PRN

## 2023-08-20 NOTE — ED Provider Notes (Addendum)
 [place] EMERGENCY DEPARTMENT AT [facility] Provider Note   CSN: [id number] Arrival date & time: 08/20/23  1036     Patient presents with: Fall  HPI Laurie  D [name] is a 68 y.o. female with history of hypertension, hyperlipidemia, COPD, CAD presenting for right wrist pain after a fall.  Occurred this morning around 5 AM.  She states she was going from the commode to the sink in the bathroom and tripped and fell with her right hand outstretched.  She denies head injury or loss of consciousness.  Experienced immediate pain in the right wrist and swelling.  Having difficulty moving her hand and fingers due to pain.  Denies any sensation loss.  Denies use of blood thinner.  Per med chart review, does take Plavix  and aspirin  daily.    Fall       Prior to Admission medications   Medication Sig Start Date End Date Taking? Authorizing Provider  HYDROcodone -acetaminophen  (NORCO/VICODIN) 5-325 MG tablet Take 1 tablet by mouth every 6 (six) hours as needed. 08/20/23  Yes Drake Wuertz K, PA-C  losartan  (COZAAR ) 50 MG tablet Take 50 mg by mouth every morning. 07/28/23  Yes [provider]  albuterol  (VENTOLIN  HFA) 108 (90 Base) MCG/ACT inhaler INHALE 2 PUFFS BY MOUTH EVERY 6 HOURS AS NEEDED FOR SHORTNESS OF BREATH AND WHEEZING 10/31/22   Nafziger, Darleene, NP  aspirin  EC 81 MG tablet Take 1 tablet (81 mg total) by mouth daily. Swallow whole. 09/27/19   Verlin Lonni BIRCH, MD  atorvastatin  (LIPITOR) 40 MG tablet TAKE 1 TABLET BY MOUTH EVERY EVENING 01/07/23   Nafziger, Darleene, NP  clopidogrel  (PLAVIX ) 75 MG tablet Take 1 tablet (75 mg total) by mouth daily. 01/06/23   Swinyer, Rosaline HERO, NP  fenofibrate  (TRICOR ) 145 MG tablet TAKE 1 TABLET BY MOUTH EVERY EVENING 01/07/23   Nafziger, Darleene, NP  fluticasone  (FLONASE ) 50 MCG/ACT nasal spray INSTILL TWO (2) SPRAYS IN EACH NOSTRIL DAILY 10/31/22   Nafziger, Darleene, NP  furosemide  (LASIX ) 20 MG tablet TAKE ONE TABLET BY MOUTH ONCE Daily  04/11/23   Nafziger, Darleene, NP  meclizine  (ANTIVERT ) 25 MG tablet Take 1 tablet (25 mg total) by mouth 3 (three) times daily as needed for dizziness. 01/30/23   Kingsley, Victoria K, DO  methadone  (DOLOPHINE ) 10 MG/ML solution Take 80 mg by mouth daily.    [provider]  methimazole  (TAPAZOLE ) 10 MG tablet Take 1 tablet (10 mg total) by mouth 2 (two) times daily. TAKE 1 TABLET BY MOUTH EACH EVENING 05/08/23 08/06/23  Dartha Ernst, MD  metoprolol  succinate (TOPROL -XL) 50 MG 24 hr tablet TAKE 1 TABLET BY MOUTH EVERY MORNING WITH OR IMMEDIATELY FOLLOWING A MEAL 10/15/22   Nafziger, Darleene, NP  nitroGLYCERIN  (NITROSTAT ) 0.4 MG SL tablet Place 1 tablet (0.4 mg total) under the tongue every 5 (five) minutes as needed for chest pain. 01/06/23 08/01/23  Swinyer, Rosaline HERO, NP  olmesartan  (BENICAR ) 40 MG tablet Take 1 tablet (40 mg total) by mouth daily. 07/02/23   Nafziger, Darleene, NP  pantoprazole  (PROTONIX ) 40 MG tablet TAKE 1 TABLET BY MOUTH EVERY MORNING 01/29/23   Nafziger, Darleene, NP  spironolactone  (ALDACTONE ) 25 MG tablet Take 1 tablet (25 mg total) by mouth daily. 07/02/23   Nafziger, Darleene, NP  venlafaxine  XR (EFFEXOR  XR) 75 MG 24 hr capsule Take with 150 mg dose 06/11/23   Nafziger, Darleene, NP  venlafaxine  XR (EFFEXOR -XR) 150 MG 24 hr capsule TAKE 1 CAPSULE BY MOUTH EVERY MORNING 11/29/22   Nafziger,  Cory, NP  Vitamin D , Ergocalciferol , (DRISDOL ) 1.25 MG (50000 UNIT) CAPS capsule Take 1 capsule (50,000 Units total) by mouth every 7 (seven) days. 06/12/23 09/10/23  Nafziger, Darleene, NP    Allergies: Codeine, Lisinopril , Sulfonamide derivatives, and Tetracyclines & related    Review of Systems See HPI  Updated Vital Signs BP 130/64 (BP Location: Left Arm)   Pulse 68   Temp 98.9 F (37.2 C)   Resp 17   Ht 5' 10 (1.778 m)   Wt 75.8 kg   SpO2 92%   BMI 23.96 kg/m   Physical Exam Constitutional:      Appearance: Normal appearance.  HENT:     Head: Normocephalic.     Nose: Nose normal.  Eyes:      Conjunctiva/sclera: Conjunctivae normal.  Cardiovascular:     Pulses:          Radial pulses are 2+ on the right side and 2+ on the left side.  Pulmonary:     Effort: Pulmonary effort is normal.  Musculoskeletal:     Right wrist: Swelling, tenderness and bony tenderness present. No deformity or lacerations. Decreased range of motion. Normal pulse.  Neurological:     Mental Status: She is alert.     Sensory: Sensation is intact.  Psychiatric:        Mood and Affect: Mood normal.     (all labs ordered are listed, but only abnormal results are displayed) Labs Reviewed - No data to display  EKG: None  Radiology: DG Wrist Complete Right Result Date: 08/20/2023 CLINICAL DATA:  fall.  Right wrist pain. EXAM: RIGHT WRIST - COMPLETE 3+ VIEW COMPARISON:  None Available. FINDINGS: There is mildly displaced and impacted fracture of the distal right radial metaphysis. No intra-articular extension. There is also avulsion fracture of the ulnar styloid process. No other acute fracture or dislocation. No aggressive osseous lesion. No significant arthritis of imaged joints. No radiopaque foreign bodies. Soft tissues are within normal limits. IMPRESSION: *Mildly displaced and impacted fracture of the distal right radial metaphysis. Avulsion fracture of the ulnar styloid process. Electronically Signed   By: Ree Molt M.D.   On: 08/20/2023 11:59     Procedures   Medications Ordered in the ED  oxyCODONE -acetaminophen  (PERCOCET/ROXICET) 5-325 MG per tablet 1 tablet (has no administration in time range)  HYDROcodone -acetaminophen  (NORCO/VICODIN) 5-325 MG per tablet 1 tablet (1 tablet Oral Given 08/20/23 1116)                                    Medical Decision Making Amount and/or Complexity of Data Reviewed Radiology: ordered.  Risk Prescription drug management.   68 year old well-appearing female presenting for right wrist injury after mechanical fall earlier today.  Exam notable for  swelling and tenderness about the right wrist but otherwise neurovascularly intact and no obvious deformity noted.  X-ray revealed mildly displaced and impacted fracture of the distal right radial metaphysis and avulsion fracture of ulnar styloid process.  Agree with radiology interpretation.  Applied sugar-tong splint.  Reassessed the wrist after application and right arm remained neurovascularly intact.  Advised her to follow-up with orthopedics. Sent a few tablets of Norco to her pharmacy for acute pain.      Final diagnoses:  Closed fracture of right wrist, initial encounter    ED Discharge Orders          Ordered    HYDROcodone -acetaminophen  (NORCO/VICODIN) 5-325 MG  tablet  Every 6 hours PRN        08/20/23 1259               Lang Norleen POUR, PA-C 08/20/23 1300    Jerrol Agent, MD 08/20/23 1414

## 2023-08-20 NOTE — ED Triage Notes (Signed)
 Fall with right wrist pain. Happened today at approx 0500.

## 2023-08-20 NOTE — ED Notes (Signed)
 Discharge instructions, follow up care with ortho, and prescriptions reviewed and explained, pt verbalized understanding and had no further questions on d/c. Pt caox4, ambulatory NAD on d/c.

## 2023-08-20 NOTE — Discharge Instructions (Addendum)
 Evaluation today revealed that you fractured the distal end of your right radial and ulnar bones.  Please follow-up with orthopedics.  In the meantime please maintain the wrist immobilized with a splint.  If your arm becomes numb or cold or the pain is worse please return to the ED for further evaluation.  I did send a few tablets of Norco to your pharmacy to help with pain at home.

## 2023-08-21 DIAGNOSIS — M25511 Pain in right shoulder: Secondary | ICD-10-CM | POA: Diagnosis not present

## 2023-08-21 DIAGNOSIS — M25531 Pain in right wrist: Secondary | ICD-10-CM | POA: Diagnosis not present

## 2023-08-22 ENCOUNTER — Telehealth: Payer: Self-pay

## 2023-08-22 NOTE — Transitions of Care (Post Inpatient/ED Visit) (Signed)
 08/22/2023  Name: Laurie [name] MRN: [id number] DOB: Apr 07, 1955  Today's TOC FU Call Status: Today's TOC FU Call Status:: Unsuccessful Call (1st Attempt) Unsuccessful Call (1st Attempt) Date: 08/22/23 Patient's Name and Date of Birth confirmed.  Transition Care Management Follow-up Telephone Call Date of Discharge: 08/20/23 Discharge Facility: [facility] ([facility]) Type of Discharge: Emergency Department Reason for ED Visit: Other: How have you been since you were released from the hospital?: Better Any questions or concerns?: No  Items Reviewed: Did you receive and understand the discharge instructions provided?: Yes Medications obtained,verified, and reconciled?: Yes (Medications Reviewed) Any new allergies since your discharge?: No Dietary orders reviewed?: No Do you have support at home?: Yes People in Home [RPT]: spouse Name of Support/Comfort Primary Source: frank  Medications Reviewed Today: Medications Reviewed Today     Reviewed by Elner Shan NOVAK, CMA (Certified Medical Assistant) on 08/22/23 at 1314  Med List Status: <None>   Medication Order Taking? Sig Documenting Provider Last Dose Status Informant  albuterol  (VENTOLIN  HFA) 108 (90 Base) MCG/ACT inhaler [id number] Yes INHALE 2 PUFFS BY MOUTH EVERY 6 HOURS AS NEEDED FOR SHORTNESS OF BREATH AND WHEEZING Nafziger, Darleene, NP  Active Self  aspirin  EC 81 MG tablet [id number] Yes Take 1 tablet (81 mg total) by mouth daily. Swallow whole. Verlin Lonni JONETTA, MD  Active Self  atorvastatin  (LIPITOR) 40 MG tablet [id number] Yes TAKE 1 TABLET BY MOUTH EVERY EVENING Nafziger, Darleene, NP  Active Self  clopidogrel  (PLAVIX ) 75 MG tablet [id number] Yes Take 1 tablet (75 mg total) by mouth daily. Swinyer, Rosaline HERO, NP  Active Self  fenofibrate  (TRICOR ) 145 MG tablet [id number] Yes TAKE 1 TABLET BY MOUTH EVERY EVENING Nafziger, Darleene, NP  Active Self  fluticasone  (FLONASE ) 50 MCG/ACT nasal spray [id number] Yes INSTILL TWO (2)  SPRAYS IN EACH NOSTRIL DAILY Nafziger, Darleene, NP  Active Self  furosemide  (LASIX ) 20 MG tablet [id number] Yes TAKE ONE TABLET BY MOUTH ONCE Daily Nafziger, Cory, NP  Active   HYDROcodone -acetaminophen  (NORCO/VICODIN) 5-325 MG tablet [id number] Yes Take 1 tablet by mouth every 6 (six) hours as needed. Robinson, John K, PA-C  Active   losartan  (COZAAR ) 50 MG tablet [id number] Yes Take 50 mg by mouth every morning. [provider]  Active   meclizine  (ANTIVERT ) 25 MG tablet [id number] Yes Take 1 tablet (25 mg total) by mouth 3 (three) times daily as needed for dizziness. Kingsley, Victoria K, DO  Active   methadone  (DOLOPHINE ) 10 MG/ML solution [id number] Yes Take 80 mg by mouth daily. [provider]  Active            Med Note (WHITE, DONETA GORMAN Schaumann Jan 30, 2023  7:18 AM) Methadone  dose verified by Bernarda from [facility]  methimazole  (TAPAZOLE ) 10 MG tablet [id number] Yes Take 1 tablet (10 mg total) by mouth 2 (two) times daily. TAKE 1 TABLET BY MOUTH EACH EVENING Motwani, Komal, MD  Active   metoprolol  succinate (TOPROL -XL) 50 MG 24 hr tablet [id number] Yes TAKE 1 TABLET BY MOUTH EVERY MORNING WITH OR IMMEDIATELY FOLLOWING A MEAL Nafziger, Darleene, NP  Active Self  nitroGLYCERIN  (NITROSTAT ) 0.4 MG SL tablet [id number] Yes Place 1 tablet (0.4 mg total) under the tongue every 5 (five) minutes as needed for chest pain. Swinyer, Rosaline HERO, NP  Active Self           Med Note [facility], DONETA GORMAN Schaumann Jan 30, 2023  4:39 AM) On hand if needed  olmesartan  (BENICAR ) 40 MG  tablet [id number] Yes Take 1 tablet (40 mg total) by mouth daily. Nafziger, Darleene, NP  Active   pantoprazole  (PROTONIX ) 40 MG tablet [id number] Yes TAKE 1 TABLET BY MOUTH EVERY MORNING Nafziger, Darleene, NP  Active Self  spironolactone  (ALDACTONE ) 25 MG tablet [id number] Yes Take 1 tablet (25 mg total) by mouth daily. Nafziger, Darleene, NP  Active   venlafaxine  XR (EFFEXOR  XR) 75 MG 24 hr capsule [id number] Yes Take with 150 mg dose Nafziger, Cory,  NP  Active   venlafaxine  XR (EFFEXOR -XR) 150 MG 24 hr capsule [id number] Yes TAKE 1 CAPSULE BY MOUTH EVERY MORNING Nafziger, Cory, NP  Active Self  Vitamin D , Ergocalciferol , (DRISDOL ) 1.25 MG (50000 UNIT) CAPS capsule [id number] Yes Take 1 capsule (50,000 Units total) by mouth every 7 (seven) days. Nafziger, Darleene, NP  Active   Med List Note Isabel Doneta RAMAN, CPhT 01/30/23 [id number]): [facility] [employer] [place] [phone]            Home Care and Equipment/Supplies: Were Home Health Services Ordered?: No Any new equipment or medical supplies ordered?: No  Functional Questionnaire: Do you need assistance with bathing/showering or dressing?: No Do you need assistance with meal preparation?: No Do you need assistance with eating?: No Do you have difficulty maintaining continence: No Do you need assistance with getting out of bed/getting out of a chair/moving?: No Do you have difficulty managing or taking your medications?: No  Follow up appointments reviewed: PCP Follow-up appointment confirmed?: NA Specialist Hospital Follow-up appointment confirmed?: No Reason Specialist Follow-Up Not Confirmed: Patient has Specialist Provider Number and will Call for Appointment Do you need transportation to your follow-up appointment?: No Do you understand care options if your condition(s) worsen?: Yes-patient verbalized understanding    SIGNATURE Wolfgang Finigan,CMA

## 2023-08-24 ENCOUNTER — Emergency Department (HOSPITAL_BASED_OUTPATIENT_CLINIC_OR_DEPARTMENT_OTHER)
Admission: EM | Admit: 2023-08-24 | Discharge: 2023-08-24 | Disposition: A | Discharge status: Home / Self Care | Attending: Emergency Medicine | Admitting: Emergency Medicine

## 2023-08-24 ENCOUNTER — Other Ambulatory Visit: Payer: Self-pay

## 2023-08-24 ENCOUNTER — Emergency Department (HOSPITAL_BASED_OUTPATIENT_CLINIC_OR_DEPARTMENT_OTHER): Admitting: Radiology

## 2023-08-24 ENCOUNTER — Encounter (HOSPITAL_BASED_OUTPATIENT_CLINIC_OR_DEPARTMENT_OTHER): Payer: Self-pay

## 2023-08-24 DIAGNOSIS — R21 Rash and other nonspecific skin eruption: Secondary | ICD-10-CM | POA: Diagnosis not present

## 2023-08-24 DIAGNOSIS — Z7982 Long term (current) use of aspirin: Secondary | ICD-10-CM | POA: Insufficient documentation

## 2023-08-24 DIAGNOSIS — Z7901 Long term (current) use of anticoagulants: Secondary | ICD-10-CM | POA: Insufficient documentation

## 2023-08-24 DIAGNOSIS — M25511 Pain in right shoulder: Secondary | ICD-10-CM | POA: Diagnosis not present

## 2023-08-24 MED ORDER — ONDANSETRON 4 MG PO TBDP
8.0000 mg | ORAL_TABLET | Freq: Once | ORAL | Status: AC
  Administered 2023-08-24: 8 mg via ORAL
  Filled 2023-08-24: qty 2

## 2023-08-24 MED ORDER — HYDROMORPHONE HCL 1 MG/ML IJ SOLN
1.0000 mg | Freq: Once | INTRAMUSCULAR | Status: AC
  Administered 2023-08-24: 1 mg via INTRAMUSCULAR
  Filled 2023-08-24: qty 1

## 2023-08-24 MED ORDER — DEXAMETHASONE SODIUM PHOSPHATE 10 MG/ML IJ SOLN
10.0000 mg | Freq: Once | INTRAMUSCULAR | Status: AC
  Administered 2023-08-24: 10 mg via INTRAMUSCULAR
  Filled 2023-08-24: qty 1

## 2023-08-24 NOTE — ED Triage Notes (Addendum)
 Says she fell down on 7/9 injuring right lower arm and was evaluated here.   Afterwards she fell again injuring right upper arm. Evaluated by orthopedics afterwards and told it may be rotator cuff.   Wearing shoulder immobolizer and has taken short course of tramadol  as prescribed but has ran out.

## 2023-08-24 NOTE — ED Notes (Addendum)
 Discharge instructions reviewed.   Opportunity for questions and concerns provided.   Alert, oriented and ambulatory.   Displays no signs of distress.   Spouse transporting pt home.

## 2023-08-24 NOTE — ED Provider Notes (Signed)
 [place] EMERGENCY DEPARTMENT AT [facility] Provider Note   CSN: [id number] Arrival date & time: 08/24/23  0355     Patient presents with: Fall   Pheonix  D [name] is a 68 y.o. female.   Presents to the emergency department for evaluation of severe right shoulder pain.  Patient reports that she recently had a fall, broke her right wrist.  After that fall she fell again and injured her right shoulder.  She was seen by orthopedics for follow-up of both and told that she might have a rotator cuff injury.  She is wearing a sling and has a short arm cast on the right wrist.  Patient reports taking several tramadol  over the course of tonight without any relief of her shoulder pain.  Patient has also noticed a rash on the right shoulder and chest wall area.  She denies pain and itching.       Prior to Admission medications   Medication Sig Start Date End Date Taking? Authorizing Provider  albuterol  (VENTOLIN  HFA) 108 (90 Base) MCG/ACT inhaler INHALE 2 PUFFS BY MOUTH EVERY 6 HOURS AS NEEDED FOR SHORTNESS OF BREATH AND WHEEZING 10/31/22   Nafziger, Darleene, NP  aspirin  EC 81 MG tablet Take 1 tablet (81 mg total) by mouth daily. Swallow whole. 09/27/19   Verlin Lonni BIRCH, MD  atorvastatin  (LIPITOR) 40 MG tablet TAKE 1 TABLET BY MOUTH EVERY EVENING 01/07/23   Nafziger, Darleene, NP  clopidogrel  (PLAVIX ) 75 MG tablet Take 1 tablet (75 mg total) by mouth daily. 01/06/23   Swinyer, Rosaline HERO, NP  fenofibrate  (TRICOR ) 145 MG tablet TAKE 1 TABLET BY MOUTH EVERY EVENING 01/07/23   Nafziger, Darleene, NP  fluticasone  (FLONASE ) 50 MCG/ACT nasal spray INSTILL TWO (2) SPRAYS IN EACH NOSTRIL DAILY 10/31/22   Nafziger, Darleene, NP  furosemide  (LASIX ) 20 MG tablet TAKE ONE TABLET BY MOUTH ONCE Daily 04/11/23   Nafziger, Darleene, NP  HYDROcodone -acetaminophen  (NORCO/VICODIN) 5-325 MG tablet Take 1 tablet by mouth every 6 (six) hours as needed. 08/20/23   Robinson, John K, PA-C  losartan  (COZAAR ) 50 MG tablet  Take 50 mg by mouth every morning. 07/28/23   [provider]  meclizine  (ANTIVERT ) 25 MG tablet Take 1 tablet (25 mg total) by mouth 3 (three) times daily as needed for dizziness. 01/30/23   Kingsley, Victoria K, DO  methadone  (DOLOPHINE ) 10 MG/ML solution Take 80 mg by mouth daily.    [provider]  methimazole  (TAPAZOLE ) 10 MG tablet Take 1 tablet (10 mg total) by mouth 2 (two) times daily. TAKE 1 TABLET BY MOUTH EACH EVENING 05/08/23 08/22/23  Motwani, Komal, MD  metoprolol  succinate (TOPROL -XL) 50 MG 24 hr tablet TAKE 1 TABLET BY MOUTH EVERY MORNING WITH OR IMMEDIATELY FOLLOWING A MEAL 10/15/22   Nafziger, Darleene, NP  nitroGLYCERIN  (NITROSTAT ) 0.4 MG SL tablet Place 1 tablet (0.4 mg total) under the tongue every 5 (five) minutes as needed for chest pain. 01/06/23 08/22/23  Swinyer, Rosaline HERO, NP  olmesartan  (BENICAR ) 40 MG tablet Take 1 tablet (40 mg total) by mouth daily. 07/02/23   Nafziger, Darleene, NP  pantoprazole  (PROTONIX ) 40 MG tablet TAKE 1 TABLET BY MOUTH EVERY MORNING 01/29/23   Nafziger, Darleene, NP  spironolactone  (ALDACTONE ) 25 MG tablet Take 1 tablet (25 mg total) by mouth daily. 07/02/23   Nafziger, Darleene, NP  venlafaxine  XR (EFFEXOR  XR) 75 MG 24 hr capsule Take with 150 mg dose 06/11/23   Nafziger, Darleene, NP  venlafaxine  XR (EFFEXOR -XR) 150 MG 24 hr  capsule TAKE 1 CAPSULE BY MOUTH EVERY MORNING 11/29/22   Nafziger, Darleene, NP  Vitamin D , Ergocalciferol , (DRISDOL ) 1.25 MG (50000 UNIT) CAPS capsule Take 1 capsule (50,000 Units total) by mouth every 7 (seven) days. 06/12/23 09/10/23  Nafziger, Darleene, NP    Allergies: Codeine, Lisinopril , Sulfonamide derivatives, and Tetracyclines & related    Review of Systems  Updated Vital Signs BP (!) 141/70   Pulse 82   Temp 97.8 F (36.6 C)   Resp 18   Ht 5' 9 (1.753 m)   Wt 76.2 kg   SpO2 100%   BMI 24.81 kg/m   Physical Exam Vitals and nursing note reviewed.  Constitutional:      General: She is in acute distress.      Appearance: She is well-developed.  HENT:     Head: Normocephalic and atraumatic.     Mouth/Throat:     Mouth: Mucous membranes are moist.  Eyes:     General: Vision grossly intact. Gaze aligned appropriately.     Extraocular Movements: Extraocular movements intact.     Conjunctiva/sclera: Conjunctivae normal.  Cardiovascular:     Rate and Rhythm: Normal rate and regular rhythm.     Pulses: Normal pulses.     Heart sounds: Normal heart sounds, S1 normal and S2 normal. No murmur heard.    No friction rub. No gallop.  Pulmonary:     Effort: Pulmonary effort is normal. No respiratory distress.     Breath sounds: Normal breath sounds.  Abdominal:     General: Bowel sounds are normal.     Palpations: Abdomen is soft.     Tenderness: There is no abdominal tenderness. There is no guarding or rebound.     Hernia: No hernia is present.  Musculoskeletal:        General: No swelling.     Right shoulder: Tenderness present. Decreased range of motion.     Cervical back: Full passive range of motion without pain, normal range of motion and neck supple. No spinous process tenderness or muscular tenderness. Normal range of motion.     Right lower leg: No edema.     Left lower leg: No edema.  Skin:    General: Skin is warm and dry.     Capillary Refill: Capillary refill takes less than 2 seconds.     Findings: Rash (Maculopapular rash right side of upper torso, no vesicles) present. No ecchymosis, erythema or wound.  Neurological:     General: No focal deficit present.     Mental Status: She is alert and oriented to person, place, and time.     GCS: GCS eye subscore is 4. GCS verbal subscore is 5. GCS motor subscore is 6.     Cranial Nerves: Cranial nerves 2-12 are intact.     Sensory: Sensation is intact.     Motor: Motor function is intact.     Coordination: Coordination is intact.  Psychiatric:        Attention and Perception: Attention normal.        Mood and Affect: Mood normal. Affect  is tearful.        Speech: Speech normal.        Behavior: Behavior normal.     (all labs ordered are listed, but only abnormal results are displayed) Labs Reviewed - No data to display  EKG: None  Radiology: DG Shoulder Right Result Date: 08/24/2023 CLINICAL DATA:  Right shoulder pain. Recent right wrist fracture 08/20/2023 EXAM: RIGHT SHOULDER -  2+ VIEW COMPARISON:  No comparison studies. FINDINGS: Routine three views. There is no evidence of fracture or dislocation. There is no evidence of arthropathy or other focal bone abnormality. Soft tissues are unremarkable. IMPRESSION: Negative. Electronically Signed   By: Francis Quam M.D.   On: 08/24/2023 05:07     Procedures   Medications Ordered in the ED  dexamethasone  (DECADRON ) injection 10 mg (has no administration in time range)  HYDROmorphone  (DILAUDID ) injection 1 mg (1 mg Intramuscular Given 08/24/23 0431)  ondansetron  (ZOFRAN -ODT) disintegrating tablet 8 mg (8 mg Oral Given 08/24/23 0429)                                    Medical Decision Making Amount and/or Complexity of Data Reviewed Radiology: ordered.  Risk Prescription drug management.   Presents with right shoulder pain.  Patient reports multiple falls recently.  She appeared to be in distress at arrival, improved after Dilaudid  injection.  X-ray was obtained, no abnormality noted.  She does have orthopedics to follow-up with.  Patient with a nonspecific maculopapular rash.  No vesicles or signs of shingles.  Will administer Decadron  injection to help with inflammatory process in her shoulder/neck area as well as the rash.  Follow-up with primary care as well as orthopedics.     Final diagnoses:  Acute pain of right shoulder  Rash    ED Discharge Orders     None          Haze Lonni PARAS, MD 08/24/23 [phone]

## 2023-08-25 ENCOUNTER — Other Ambulatory Visit: Payer: Self-pay | Admitting: Neurological Surgery

## 2023-08-26 ENCOUNTER — Ambulatory Visit (INDEPENDENT_AMBULATORY_CARE_PROVIDER_SITE_OTHER): Admitting: Adult Health

## 2023-08-26 ENCOUNTER — Encounter: Payer: Self-pay | Admitting: Adult Health

## 2023-08-26 VITALS — BP 150/100 | HR 75 | Temp 98.0°F

## 2023-08-26 DIAGNOSIS — I10 Essential (primary) hypertension: Secondary | ICD-10-CM | POA: Diagnosis not present

## 2023-08-26 DIAGNOSIS — B027 Disseminated zoster: Secondary | ICD-10-CM

## 2023-08-26 MED ORDER — PREDNISONE 10 MG PO TABS: 10.0000 mg | ORAL_TABLET | Freq: Every day | ORAL | 0 refills | Status: DC

## 2023-08-26 MED ORDER — NAPROXEN 500 MG PO TABS: 500.0000 mg | ORAL_TABLET | Freq: Two times a day (BID) | ORAL | 0 refills | Status: AC

## 2023-08-26 MED ORDER — VALACYCLOVIR HCL 1 G PO TABS: 1000.0000 mg | ORAL_TABLET | Freq: Three times a day (TID) | ORAL | 0 refills | Status: AC

## 2023-08-26 NOTE — Progress Notes (Signed)
 Subjective:    Patient ID: Laurie [name], female    DOB: August 21, 1955, 68 y.o.   MRN: [id number]  HPI 68 year old female who  has a past medical history of Allergy, Anxiety, Arthritis, Asthma, Chronic back pain, COPD (chronic obstructive pulmonary disease) (HCC), Depression, Esophageal stricture, GERD (gastroesophageal reflux disease), Heart murmur, Hematemesis (09/10/2018), Hyperlipidemia, Hypertension, IBS (irritable bowel syndrome), Interstitial cystitis, Neuromuscular disorder (HCC), and PONV (postoperative nausea and vomiting).  She presents to the office today for follow up regarding a rash on her right shoulder/neck/and chest. Symptoms have been present for 3-4 days She feels as though the rash has spread and now she is getting  little bumps. Reports significant burning pain at the site of the rash. She was seen in the ER two days ago for this issue but the provider there did not think it was shingles.   Review of Systems See HPI   Past Medical History:  Diagnosis Date   Allergy    Anxiety    Arthritis    RA   Asthma    Chronic back pain    COPD (chronic obstructive pulmonary disease) (HCC)    Depression    Esophageal stricture    GERD (gastroesophageal reflux disease)    Heart murmur    Hematemesis 09/10/2018   Hyperlipidemia    Hypertension    IBS (irritable bowel syndrome)    Interstitial cystitis    Neuromuscular disorder (HCC)    fibromyalgia   PONV (postoperative nausea and vomiting)     Social History   Socioeconomic History   Marital status: Single    Spouse name: Not on file   Number of children: Not on file   Years of education: Not on file   Highest education level: Not on file  Occupational History   Occupation: disability  Tobacco Use   Smoking status: Every Day    Current packs/day: 0.50    Types: Cigarettes   Smokeless tobacco: Never   Tobacco comments:    trying to quit   Vaping Use   Vaping status: Never Used  Substance and Sexual  Activity   Alcohol use: Never    Comment: occasional   Drug use: Yes    Types: Marijuana    Comment: occas   Sexual activity: Not on file  Other Topics Concern   Not on file  Social History Narrative   Not on file   Social Drivers of Health   Financial Resource Strain: Low Risk  (06/17/2022)   Overall Financial Resource Strain (CARDIA)    Difficulty of Paying Living Expenses: Not hard at all  Food Insecurity: No Food Insecurity (07/15/2022)   Hunger Vital Sign    Worried About Running Out of Food in the Last Year: Never true    Ran Out of Food in the Last Year: Never true  Transportation Needs: No Transportation Needs (06/17/2022)   PRAPARE - Administrator, Civil Service (Medical): No    Lack of Transportation (Non-Medical): No  Physical Activity: Sufficiently Active (06/17/2022)   Exercise Vital Sign    Days of Exercise per Week: 7 days    Minutes of Exercise per Session: 30 min  Stress: No Stress Concern Present (06/17/2022)   [employer] of Occupational Health - Occupational Stress Questionnaire    Feeling of Stress : Not at all  Social Connections: Socially Integrated (06/17/2022)   Social Connection and Isolation Panel    Frequency of Communication with Friends and Family: More  than three times a week    Frequency of Social Gatherings with Friends and Family: More than three times a week    Attends Religious Services: More than 4 times per year    Active Member of Clubs or Organizations: Yes    Attends Banker Meetings: More than 4 times per year    Marital Status: Living with partner  Intimate Partner Violence: Not At Risk (06/17/2022)   Humiliation, Afraid, Rape, and Kick questionnaire    Fear of Current or Ex-Partner: No    Emotionally Abused: No    Physically Abused: No    Sexually Abused: No    Past Surgical History:  Procedure Laterality Date   ABDOMINAL HYSTERECTOMY     APPENDECTOMY     BIOPSY  09/11/2018   Procedure: BIOPSY;  Surgeon:  Wilhelmenia, Aloha Raddle., MD;  Location: [facility] ENDOSCOPY;  Service: Gastroenterology;;   CERVICAL FUSION     CERVICAL LAMINECTOMY     CORONARY STENT INTERVENTION N/A 10/07/2019   Procedure: CORONARY STENT INTERVENTION;  Surgeon: Verlin Lonni BIRCH, MD;  Location: [facility] INVASIVE CV LAB;  Service: Cardiovascular;  Laterality: N/A;   ESOPHAGOGASTRODUODENOSCOPY     ESOPHAGOGASTRODUODENOSCOPY (EGD) WITH PROPOFOL  N/A 09/11/2018   Procedure: ESOPHAGOGASTRODUODENOSCOPY (EGD) WITH PROPOFOL ;  Surgeon: Wilhelmenia Aloha Raddle., MD;  Location: [facility] ENDOSCOPY;  Service: Gastroenterology;  Laterality: N/A;   FINGER SURGERY     KYPHOPLASTY N/A 03/29/2021   Procedure: Lumbar Three KYPHOPLASTY;  Surgeon: Colon Shove, MD;  Location: [facility] OR;  Service: Neurosurgery;  Laterality: N/A;   LEFT HEART CATH AND CORONARY ANGIOGRAPHY N/A 10/07/2019   Procedure: LEFT HEART CATH AND CORONARY ANGIOGRAPHY;  Surgeon: Verlin Lonni BIRCH, MD;  Location: [facility] INVASIVE CV LAB;  Service: Cardiovascular;  Laterality: N/A;   LEFT HEART CATH AND CORONARY ANGIOGRAPHY N/A 06/20/2022   Procedure: LEFT HEART CATH AND CORONARY ANGIOGRAPHY;  Surgeon: Verlin Lonni BIRCH, MD;  Location: [facility] INVASIVE CV LAB;  Service: Cardiovascular;  Laterality: N/A;   LUMBAR FUSION     SAVORY DILATION N/A 09/11/2018   Procedure: SAVORY DILATION;  Surgeon: Wilhelmenia Aloha Raddle., MD;  Location: [facility] ENDOSCOPY;  Service: Gastroenterology;  Laterality: N/A;   TONSILLECTOMY      Family History  Problem Relation Age of Onset   Dementia Mother    Heart attack Father    Coronary artery disease Father    Cancer Brother        esophageal   Esophageal cancer Brother    Coronary artery disease Paternal Aunt    Stomach cancer Paternal Aunt    Coronary artery disease Paternal Grandmother    Aneurysm Brother        aortic   Rectal cancer Neg Hx    Colon cancer Neg Hx    Thyroid  disease Neg Hx     Allergies  Allergen Reactions   Codeine Nausea And Vomiting    Lisinopril  Cough   Misc. Sulfonamide Containing Compounds    Sulfonamide Derivatives Nausea And Vomiting   Tetracyclines & Related Rash    Current Outpatient Medications on File Prior to Visit  Medication Sig Dispense Refill   albuterol  (VENTOLIN  HFA) 108 (90 Base) MCG/ACT inhaler INHALE 2 PUFFS BY MOUTH EVERY 6 HOURS AS NEEDED FOR SHORTNESS OF BREATH AND WHEEZING 8.5 g 10   aspirin  EC 81 MG tablet Take 1 tablet (81 mg total) by mouth daily. Swallow whole. (Patient not taking: Reported on 08/26/2023) 90 tablet 3   atorvastatin  (LIPITOR) 40 MG tablet TAKE 1 TABLET BY  MOUTH EVERY EVENING 30 tablet 10   clopidogrel  (PLAVIX ) 75 MG tablet Take 1 tablet (75 mg total) by mouth daily. (Patient not taking: Reported on 08/26/2023) 90 tablet 3   fenofibrate  (TRICOR ) 145 MG tablet TAKE 1 TABLET BY MOUTH EVERY EVENING 30 tablet 10   fluticasone  (FLONASE ) 50 MCG/ACT nasal spray INSTILL TWO (2) SPRAYS IN EACH NOSTRIL DAILY 16 g 10   furosemide  (LASIX ) 20 MG tablet TAKE ONE TABLET BY MOUTH ONCE Daily 90 tablet 1   HYDROcodone -acetaminophen  (NORCO/VICODIN) 5-325 MG tablet Take 1 tablet by mouth every 6 (six) hours as needed. 5 tablet 0   losartan  (COZAAR ) 50 MG tablet Take 50 mg by mouth every morning.     meclizine  (ANTIVERT ) 25 MG tablet Take 1 tablet (25 mg total) by mouth 3 (three) times daily as needed for dizziness. 30 tablet 0   methadone  (DOLOPHINE ) 10 MG/ML solution Take 80 mg by mouth daily.     methimazole  (TAPAZOLE ) 10 MG tablet Take 1 tablet (10 mg total) by mouth 2 (two) times daily. TAKE 1 TABLET BY MOUTH EACH EVENING 180 tablet 1   metoprolol  succinate (TOPROL -XL) 50 MG 24 hr tablet TAKE 1 TABLET BY MOUTH EVERY MORNING WITH OR IMMEDIATELY FOLLOWING A MEAL 30 tablet 10   nitroGLYCERIN  (NITROSTAT ) 0.4 MG SL tablet Place 1 tablet (0.4 mg total) under the tongue every 5 (five) minutes as needed for chest pain. 25 tablet 3   olmesartan  (BENICAR ) 40 MG tablet Take 1 tablet (40 mg total) by mouth daily.  90 tablet 3   pantoprazole  (PROTONIX ) 40 MG tablet TAKE 1 TABLET BY MOUTH EVERY MORNING 30 tablet 10   spironolactone  (ALDACTONE ) 25 MG tablet Take 1 tablet (25 mg total) by mouth daily. 90 tablet 3   venlafaxine  XR (EFFEXOR  XR) 75 MG 24 hr capsule Take with 150 mg dose 90 capsule 1   venlafaxine  XR (EFFEXOR -XR) 150 MG 24 hr capsule TAKE 1 CAPSULE BY MOUTH EVERY MORNING 30 capsule 10   Vitamin D , Ergocalciferol , (DRISDOL ) 1.25 MG (50000 UNIT) CAPS capsule Take 1 capsule (50,000 Units total) by mouth every 7 (seven) days. 12 capsule 1   No current facility-administered medications on file prior to visit.    BP (!) 150/100   Pulse 75   Temp 98 F (36.7 C) (Oral)   SpO2 95%       Objective:   Physical Exam Vitals and nursing note reviewed.  Constitutional:      Appearance: Normal appearance.  Cardiovascular:     Rate and Rhythm: Normal rate and regular rhythm.     Pulses: Normal pulses.     Heart sounds: Normal heart sounds.  Pulmonary:     Effort: Pulmonary effort is normal.     Breath sounds: Normal breath sounds.  Skin:    General: Skin is warm and dry.     Findings: Erythema and rash present. Rash is vesicular.         Comments: She does have a red vesicular rash noted at the base of the neck on the right side the extends across her right chest under her breasts and right shoulder. This rash does not pass midline   Neurological:     General: No focal deficit present.     Mental Status: She is alert and oriented to person, place, and time.  Psychiatric:        Mood and Affect: Mood normal.        Behavior: Behavior normal.  Thought Content: Thought content normal.        Judgment: Judgment normal.       Assessment & Plan:  1. Disseminated herpes zoster (Primary) - Will treat for shingles with antivirals, steroids and Naprosyn  since she has not been taking her Plavix .  -  - valACYclovir  (VALTREX ) 1000 MG tablet; Take 1 tablet (1,000 mg total) by mouth 3 (three)  times daily for 10 days.  Dispense: 30 tablet; Refill: 0 - predniSONE  (DELTASONE ) 10 MG tablet; Take 1 tablet (10 mg total) by mouth daily with breakfast.  Dispense: 7 tablet; Refill: 0 - naproxen  (NAPROSYN ) 500 MG tablet; Take 1 tablet (500 mg total) by mouth 2 (two) times daily with a meal.  Dispense: 30 tablet; Refill: 0  2. Essential hypertension - Elevated in the office today past normal. Likely pain response.  - Will continue to monitor   Darleene Shape, NP

## 2023-08-27 ENCOUNTER — Other Ambulatory Visit: Payer: Self-pay | Admitting: Adult Health

## 2023-08-27 ENCOUNTER — Telehealth: Payer: Self-pay

## 2023-08-27 NOTE — Transitions of Care (Post Inpatient/ED Visit) (Signed)
   08/27/2023  Name: Laurie [name]  JASHLEY YELLIN MRN: [id number] DOB: April 13, 1955  Today's TOC FU Call Status: Today's TOC FU Call Status:: Unsuccessful Call (2nd Attempt) Unsuccessful Call (2nd Attempt) Date: 08/27/23  Attempted to reach the patient regarding the most recent Inpatient/ED visit.  Follow Up Plan: Additional outreach attempts will be made to reach the patient to complete the Transitions of Care (Post Inpatient/ED visit) call.   Signature [employer]

## 2023-09-02 DIAGNOSIS — S52501A Unspecified fracture of the lower end of right radius, initial encounter for closed fracture: Secondary | ICD-10-CM | POA: Diagnosis not present

## 2023-09-05 NOTE — Progress Notes (Signed)
 Patient with diagnosis of shingles 08/26/2023. Spoke to patient 09/05/2023. Location is right shoulder and down right arm with blisters and actively draining.  PAT appointment to be rescheduled.

## 2023-09-05 NOTE — Progress Notes (Signed)
 Surgical Instructions   Your procedure is scheduled on Monday September 15, 2023. Report to [facility] Main Entrance A at 8:45 A.M., then check in with the Admitting office. Any questions or running late day of surgery: call [phone]  Questions prior to your surgery date: call [phone], Monday-Friday, 8am-4pm. If you experience any cold or flu symptoms such as cough, fever, chills, shortness of breath, etc. between now and your scheduled surgery, please notify us  at the above number.     Remember:  Do not eat or drink  after midnight the night before your surgery  Take these medicines the morning of surgery with A SIP OF WATER  fluticasone  (FLONASE )  metoprolol  succinate (TOPROL -XL)  pantoprazole  (PROTONIX )  predniSONE  (DELTASONE )  valACYclovir  (VALTREX )  venlafaxine  XR (EFFEXOR  XR)    May take these medicines IF NEEDED: albuterol  (VENTOLIN  HFA) 108 (90 Base) MCG/ACT inhaler Please bring with you to the hospital HYDROcodone -acetaminophen  (NORCO/VICODIN)  meclizine  (ANTIVERT )  nitroGLYCERIN  (NITROSTAT ) If you have to take this medication prior to surgery, please call [phone] and report this to a nurse  Follow your surgeon's instructions on when to stop Asprin.  If no instructions were given by your surgeon then you will need to call the office to get those instructions.    FOLLOW YOUR PRESCRIBER'S INSTRUCTIONS REGARDING YOUR methadone  (DOLOPHINE ) SOLUTION.     One week prior to surgery, STOP taking any Aspirin  (unless otherwise instructed by your surgeon) Aleve , Naproxen , Ibuprofen , Motrin , Advil , Goody's, BC's, all herbal medications, fish oil, and non-prescription vitamins.                     Do NOT Smoke (Tobacco/Vaping) for 24 hours prior to your procedure.  If you use a CPAP at night, you may bring your mask/headgear for your overnight stay.   You will be asked to remove any contacts, glasses, piercing's, hearing aid's, dentures/partials prior to surgery.  Please bring cases for these items if needed.    Patients discharged the day of surgery will not be allowed to drive home, and someone needs to stay with them for 24 hours.  SURGICAL WAITING ROOM VISITATION Patients may have no more than 2 support people in the waiting area - these visitors may rotate.   Pre-op nurse will coordinate an appropriate time for 1 ADULT support person, who may not rotate, to accompany patient in pre-op.  Children under the age of 56 must have an adult with them who is not the patient and must remain in the main waiting area with an adult.  If the patient needs to stay at the hospital during part of their recovery, the visitor guidelines for inpatient rooms apply.  Please refer to the [facility] website for the visitor guidelines for any additional information.   If you received a COVID test during your pre-op visit  it is requested that you wear a mask when out in public, stay away from anyone that may not be feeling well and notify your surgeon if you develop symptoms. If you have been in contact with anyone that has tested positive in the last 10 days please notify you surgeon.      Pre-operative 5 CHG Bathing Instructions   You can play a key role in reducing the risk of infection after surgery. Your skin needs to be as free of germs as possible. You can reduce the number of germs on your skin by washing with CHG (chlorhexidine  gluconate) soap before surgery. CHG is an antiseptic  soap that kills germs and continues to kill germs even after washing.   DO NOT use if you have an allergy to chlorhexidine /CHG or antibacterial soaps. If your skin becomes reddened or irritated, stop using the CHG and notify one of our RNs at [phone].   Please shower with the CHG soap starting 4 days before surgery using the following schedule:     Please keep in mind the following:  DO NOT shave, including legs and underarms, starting the day of your first shower.   Place  clean sheets on your bed the day you start using CHG soap. Use a clean washcloth (not used since being washed) for each shower. DO NOT sleep with pets once you start using the CHG.   CHG Shower Instructions:  Wash your face and private area with normal soap. If you choose to wash your hair, wash first with your normal shampoo.  After you use shampoo/soap, rinse your hair and body thoroughly to remove shampoo/soap residue.  Turn the water OFF and apply about 3 tablespoons (45 ml) of CHG soap to a CLEAN washcloth.  Apply CHG soap ONLY FROM YOUR NECK DOWN TO YOUR TOES (washing for 3-5 minutes)  DO NOT use CHG soap on face, private areas, open wounds, or sores.  Pay special attention to the area where your surgery is being performed.  If you are having back surgery, having someone wash your back for you may be helpful. Wait 2 minutes after CHG soap is applied, then you may rinse off the CHG soap.  Pat dry with a clean towel  Put on clean clothes/pajamas   If you choose to wear lotion, please use ONLY the CHG-compatible lotions that are listed below.  Additional instructions for the day of surgery: DO NOT APPLY any lotions, deodorants or perfumes.   Do not bring valuables to the hospital. [facility] is not responsible for any belongings/valuables. Do not wear nail polish, gel polish, artificial nails, or any other type of covering on natural nails (fingers and toes) Do not wear jewelry or makeup Put on clean/comfortable clothes.  Please brush your teeth.  Ask your nurse before applying any prescription medications to the skin.     CHG Compatible Lotions   Aveeno Moisturizing lotion  Cetaphil Moisturizing Cream  Cetaphil Moisturizing Lotion  Clairol Herbal Essence Moisturizing Lotion, Dry Skin  Clairol Herbal Essence Moisturizing Lotion, Extra Dry Skin  Clairol Herbal Essence Moisturizing Lotion, Normal Skin  Curel Age Defying Therapeutic Moisturizing Lotion with Alpha Hydroxy  Curel  Extreme Care Body Lotion  Curel Soothing Hands Moisturizing Hand Lotion  Curel Therapeutic Moisturizing Cream, Fragrance-Free  Curel Therapeutic Moisturizing Lotion, Fragrance-Free  Curel Therapeutic Moisturizing Lotion, Original Formula  Eucerin Daily Replenishing Lotion  Eucerin Dry Skin Therapy Plus Alpha Hydroxy Crme  Eucerin Dry Skin Therapy Plus Alpha Hydroxy Lotion  Eucerin Original Crme  Eucerin Original Lotion  Eucerin Plus Crme Eucerin Plus Lotion  Eucerin TriLipid Replenishing Lotion  Keri Anti-Bacterial Hand Lotion  Keri Deep Conditioning Original Lotion Dry Skin Formula Softly Scented  Keri Deep Conditioning Original Lotion, Fragrance Free Sensitive Skin Formula  Keri Lotion Fast Absorbing Fragrance Free Sensitive Skin Formula  Keri Lotion Fast Absorbing Softly Scented Dry Skin Formula  Keri Original Lotion  Keri Skin Renewal Lotion Keri Silky Smooth Lotion  Keri Silky Smooth Sensitive Skin Lotion  Nivea Body Creamy Conditioning Oil  Nivea Body Extra Enriched Building services engineer Sheer Moisturizing Lotion Nivea Crme  Nivea Skin Firming Lotion  NutraDerm 30 Skin Lotion  NutraDerm Skin Lotion  NutraDerm Therapeutic Skin Cream  NutraDerm Therapeutic Skin Lotion  ProShield Protective Hand Cream  Provon moisturizing lotion  Please read over the following fact sheets that you were given.

## 2023-09-08 ENCOUNTER — Inpatient Hospital Stay (HOSPITAL_COMMUNITY)
Admission: RE | Admit: 2023-09-08 | Discharge: 2023-09-08 | Disposition: A | Discharge status: Home / Self Care | Source: Ambulatory Visit

## 2023-09-08 DIAGNOSIS — S52501A Unspecified fracture of the lower end of right radius, initial encounter for closed fracture: Secondary | ICD-10-CM | POA: Diagnosis not present

## 2023-09-09 NOTE — Progress Notes (Signed)
 Called patient to see status of shingles.  Patient stated that shingles have dried up but she will have to reschedule her surgery.  Patient states that she has broken her wrist and has to have emergent surgery.  Patient stated she will call Dr. Thayer office to let them know.

## 2023-09-10 ENCOUNTER — Telehealth: Payer: Self-pay

## 2023-09-10 ENCOUNTER — Inpatient Hospital Stay (HOSPITAL_COMMUNITY): Admission: RE | Admit: 2023-09-10 | Source: Ambulatory Visit

## 2023-09-10 NOTE — Telephone Encounter (Signed)
 Copied from CRM [id number]. Topic: Clinical - Request for Lab/Test Order >> Sep 10, 2023  9:39 AM Harlene ORN wrote: Reason for CRM:  needs approval for her PCP for wrist surgery Will be going to [facility]. Phone: [phone]

## 2023-09-10 NOTE — Telephone Encounter (Signed)
  Patient Consent for Virtual Visit        Laurie [name] has provided verbal consent on 09/10/2023 for a virtual visit (video or telephone).   CONSENT FOR VIRTUAL VISIT FOR:  Laurie [name]  By participating in this virtual visit I agree to the following:  I hereby voluntarily request, consent and authorize [facility] and its employed or contracted physicians, physician assistants, nurse practitioners or other licensed health care professionals (the Practitioner), to provide me with telemedicine health care services (the "Services) as deemed necessary by the treating Practitioner. I acknowledge and consent to receive the Services by the Practitioner via telemedicine. I understand that the telemedicine visit will involve communicating with the Practitioner through live audiovisual communication technology and the disclosure of certain medical information by electronic transmission. I acknowledge that I have been given the opportunity to request an in-person assessment or other available alternative prior to the telemedicine visit and am voluntarily participating in the telemedicine visit.  I understand that I have the right to withhold or withdraw my consent to the use of telemedicine in the course of my care at any time, without affecting my right to future care or treatment, and that the Practitioner or I may terminate the telemedicine visit at any time. I understand that I have the right to inspect all information obtained and/or recorded in the course of the telemedicine visit and may receive copies of available information for a reasonable fee.  I understand that some of the potential risks of receiving the Services via telemedicine include:  Delay or interruption in medical evaluation due to technological equipment failure or disruption; Information transmitted may not be sufficient (e.g. poor resolution of images) to allow for appropriate medical decision making by the  Practitioner; and/or  In rare instances, security protocols could fail, causing a breach of personal health information.  Furthermore, I acknowledge that it is my responsibility to provide information about my medical history, conditions and care that is complete and accurate to the best of my ability. I acknowledge that Practitioner's advice, recommendations, and/or decision may be based on factors not within their control, such as incomplete or inaccurate data provided by me or distortions of diagnostic images or specimens that may result from electronic transmissions. I understand that the practice of medicine is not an exact science and that Practitioner makes no warranties or guarantees regarding treatment outcomes. I acknowledge that a copy of this consent can be made available to me via my patient portal [facility] MyChart), or I can request a printed copy by calling the office of [facility].    I understand that my insurance will be billed for this visit.   I have read or had this consent read to me. I understand the contents of this consent, which adequately explains the benefits and risks of the Services being provided via telemedicine.  I have been provided ample opportunity to ask questions regarding this consent and the Services and have had my questions answered to my satisfaction. I give my informed consent for the services to be provided through the use of telemedicine in my medical care

## 2023-09-10 NOTE — Telephone Encounter (Signed)
 This has been taking care of. Surgical clearance faxed with confirmation.

## 2023-09-10 NOTE — Telephone Encounter (Signed)
   Pre-operative Risk Assessment    Patient Name: Laurie [name]  DOB: 05-15-1955 MRN: [id number]   Date of last office visit: 01/05/23 ROSALINE BANE, NP Date of next office visit: NONE   Request for Surgical Clearance    Procedure:  RIGHT WRIST OPEN REDUCTION INTERNAL FIXATION  Date of Surgery:  Clearance TBD         (ASAP PER REQUEST)                       Surgeon:  DR LYNWOOD RIAS Surgeon's Group or Practice Name:  [facility] Phone number:  [phone] Fax number:  [phone]  ATTN: MEGAN DAVIS   Type of Clearance Requested:   - Medical  - Pharmacy:  Hold Aspirin  and Clopidogrel  (Plavix )     Type of Anesthesia:  MAC AND IV SEDATION   Additional requests/questions:    Signed, Lucie DELENA Ku   09/10/2023, 12:08 PM

## 2023-09-10 NOTE — Telephone Encounter (Signed)
 Preop tele appt now scheduled, med rec and consent done

## 2023-09-10 NOTE — Telephone Encounter (Signed)
   Name: Laurie  MAYBEL [name]  DOB: Apr 11, 1955  MRN: [id number]  Primary Cardiologist: Lonni Cash, MD   Preoperative team, please contact this patient and set up a phone call appointment ASAP for further preoperative risk assessment. Please obtain consent and complete medication review. Last seen by Rosaline Bane on 01/06/2023. Thank you for your help.  I confirm that guidance regarding antiplatelet and oral anticoagulation therapy has been completed and, if necessary, noted below.  Per office protocol, if patient is without any new symptoms or concerns at the time of their virtual visit, she may hold Plavix  for 5 days and ASA 7 days prior to procedure. Please resume Plavix  and ASA as soon as possible postprocedure, at the discretion of the surgeon.    I also confirmed the patient resides in the state of  . As per [facility] Medical [employer] telemedicine laws, the patient must reside in the state in which the provider is licensed.   Lamarr Satterfield, NP 09/10/2023, 12:25 PM [facility]

## 2023-09-11 NOTE — Progress Notes (Unsigned)
 Virtual Visit via Telephone Note   Because of Sondra  D Wynder co-morbid illnesses, she is at least at moderate risk for complications without adequate follow up.  This format is felt to be most appropriate for this patient at this time.  Due to technical limitations with video connection (technology), today's appointment will be conducted as an audio only telehealth visit, and Marqueta  D Faraone verbally agreed to proceed in this manner.   All issues noted in this document were discussed and addressed.  No physical exam could be performed with this format.  Evaluation Performed:  Preoperative cardiovascular risk assessment _____________   Date:  09/11/2023   Patient ID:  Eve  D Wilensky, DOB 16-Aug-1955, MRN [id number] Patient Location:  Home Provider location:   Office  Primary Care Provider:  Merna Huxley, NP Primary Cardiologist:  Lonni Cash, MD  Chief Complaint / Patient Profile   68 y.o. y/o female with a h/o *** who is pending *** and presents today for telephonic preoperative cardiovascular risk assessment.  History of Present Illness    Zeniya  D Hesse is a 68 y.o. female who presents via audio/video conferencing for a telehealth visit today.  Pt was last seen in cardiology clinic on *** by ***.  At that time Daziah  D Dirico was doing well ***.  The patient is now pending procedure as outlined above. Since her last visit, she ***  Past Medical History    Past Medical History:  Diagnosis Date   Allergy    Anxiety    Arthritis    RA   Asthma    Chronic back pain    COPD (chronic obstructive pulmonary disease) (HCC)    Depression    Esophageal stricture    GERD (gastroesophageal reflux disease)    Heart murmur    Hematemesis 09/10/2018   Hyperlipidemia    Hypertension    IBS (irritable bowel syndrome)    Interstitial cystitis    Neuromuscular disorder (HCC)    fibromyalgia   PONV (postoperative nausea and vomiting)    Past Surgical History:   Procedure Laterality Date   ABDOMINAL HYSTERECTOMY     APPENDECTOMY     BIOPSY  09/11/2018   Procedure: BIOPSY;  Surgeon: Wilhelmenia Aloha Raddle., MD;  Location: [facility] ENDOSCOPY;  Service: Gastroenterology;;   CERVICAL FUSION     CERVICAL LAMINECTOMY     CORONARY STENT INTERVENTION N/A 10/07/2019   Procedure: CORONARY STENT INTERVENTION;  Surgeon: Cash Lonni BIRCH, MD;  Location: [facility] INVASIVE CV LAB;  Service: Cardiovascular;  Laterality: N/A;   ESOPHAGOGASTRODUODENOSCOPY     ESOPHAGOGASTRODUODENOSCOPY (EGD) WITH PROPOFOL  N/A 09/11/2018   Procedure: ESOPHAGOGASTRODUODENOSCOPY (EGD) WITH PROPOFOL ;  Surgeon: Wilhelmenia Aloha Raddle., MD;  Location: [facility] ENDOSCOPY;  Service: Gastroenterology;  Laterality: N/A;   FINGER SURGERY     KYPHOPLASTY N/A 03/29/2021   Procedure: Lumbar Three KYPHOPLASTY;  Surgeon: Colon Shove, MD;  Location: [facility] OR;  Service: Neurosurgery;  Laterality: N/A;   LEFT HEART CATH AND CORONARY ANGIOGRAPHY N/A 10/07/2019   Procedure: LEFT HEART CATH AND CORONARY ANGIOGRAPHY;  Surgeon: Cash Lonni BIRCH, MD;  Location: [facility] INVASIVE CV LAB;  Service: Cardiovascular;  Laterality: N/A;   LEFT HEART CATH AND CORONARY ANGIOGRAPHY N/A 06/20/2022   Procedure: LEFT HEART CATH AND CORONARY ANGIOGRAPHY;  Surgeon: Cash Lonni BIRCH, MD;  Location: [facility] INVASIVE CV LAB;  Service: Cardiovascular;  Laterality: N/A;   LUMBAR FUSION     SAVORY DILATION N/A 09/11/2018   Procedure: SAVORY DILATION;  Surgeon: Wilhelmenia Aloha Raddle., MD;  Location: [facility] ENDOSCOPY;  Service: Gastroenterology;  Laterality: N/A;   TONSILLECTOMY      Allergies  Allergies  Allergen Reactions   Codeine Nausea And Vomiting   Lisinopril  Cough   Misc. Sulfonamide Containing Compounds    Sulfonamide Derivatives Nausea And Vomiting   Tetracyclines & Related Rash    Home Medications    Prior to Admission medications   Medication Sig Start Date End Date Taking? Authorizing Provider  albuterol  (VENTOLIN  HFA)  108 (90 Base) MCG/ACT inhaler INHALE 2 PUFFS BY MOUTH EVERY 6 HOURS AS NEEDED FOR SHORTNESS OF BREATH AND WHEEZING 10/31/22   Nafziger, Darleene, NP  aspirin  EC 81 MG tablet Take 1 tablet (81 mg total) by mouth daily. Swallow whole. 09/27/19   Verlin Lonni BIRCH, MD  atorvastatin  (LIPITOR) 40 MG tablet TAKE 1 TABLET BY MOUTH EVERY EVENING 01/07/23   Nafziger, Darleene, NP  clopidogrel  (PLAVIX ) 75 MG tablet Take 1 tablet (75 mg total) by mouth daily. 01/06/23   Swinyer, Rosaline HERO, NP  fenofibrate  (TRICOR ) 145 MG tablet TAKE 1 TABLET BY MOUTH EVERY EVENING 01/07/23   Nafziger, Darleene, NP  fluticasone  (FLONASE ) 50 MCG/ACT nasal spray INSTILL TWO (2) SPRAYS IN EACH NOSTRIL DAILY 10/31/22   Nafziger, Darleene, NP  furosemide  (LASIX ) 20 MG tablet TAKE ONE TABLET BY MOUTH ONCE Daily 04/11/23   Nafziger, Darleene, NP  HYDROcodone -acetaminophen  (NORCO/VICODIN) 5-325 MG tablet Take 1 tablet by mouth every 6 (six) hours as needed. 08/20/23   Robinson, John K, PA-C  losartan  (COZAAR ) 50 MG tablet Take 50 mg by mouth every morning. 07/28/23   [provider]  meclizine  (ANTIVERT ) 25 MG tablet Take 1 tablet (25 mg total) by mouth 3 (three) times daily as needed for dizziness. 01/30/23   Kingsley, Victoria K, DO  methadone  (DOLOPHINE ) 10 MG/ML solution Take 80 mg by mouth daily.    [provider]  methimazole  (TAPAZOLE ) 10 MG tablet Take 1 tablet (10 mg total) by mouth 2 (two) times daily. TAKE 1 TABLET BY MOUTH EACH EVENING 05/08/23 09/10/23  Motwani, Komal, MD  metoprolol  succinate (TOPROL -XL) 50 MG 24 hr tablet TAKE 1 TABLET BY MOUTH EVERY MORNING WITH OR IMMEDIATELY FOLLOWING A MEAL 08/28/23   Nafziger, Darleene, NP  naproxen  (NAPROSYN ) 500 MG tablet Take 1 tablet (500 mg total) by mouth 2 (two) times daily with a meal. 08/26/23   Nafziger, Darleene, NP  nitroGLYCERIN  (NITROSTAT ) 0.4 MG SL tablet Place 1 tablet (0.4 mg total) under the tongue every 5 (five) minutes as needed for chest pain. 01/06/23 09/10/23  Swinyer,  Rosaline HERO, NP  olmesartan  (BENICAR ) 40 MG tablet Take 1 tablet (40 mg total) by mouth daily. 07/02/23   Nafziger, Darleene, NP  pantoprazole  (PROTONIX ) 40 MG tablet TAKE 1 TABLET BY MOUTH EVERY MORNING 01/29/23   Nafziger, Darleene, NP  predniSONE  (DELTASONE ) 10 MG tablet Take 1 tablet (10 mg total) by mouth daily with breakfast. 08/26/23   Nafziger, Darleene, NP  spironolactone  (ALDACTONE ) 25 MG tablet Take 1 tablet (25 mg total) by mouth daily. 07/02/23   Nafziger, Darleene, NP  venlafaxine  XR (EFFEXOR  XR) 75 MG 24 hr capsule Take with 150 mg dose 06/11/23   Nafziger, Cory, NP  venlafaxine  XR (EFFEXOR -XR) 150 MG 24 hr capsule TAKE 1 CAPSULE BY MOUTH EVERY MORNING 11/29/22   Nafziger, Cory, NP    Physical Exam    Vital Signs:  Aidan  D Sutherland does not have vital signs available for review today.***  Given telephonic nature of communication, physical exam is  limited. AAOx3. NAD. Normal affect.  Speech and respirations are unlabored.  Accessory Clinical Findings    None  Assessment & Plan    1.  Preoperative Cardiovascular Risk Assessment:      Primary Cardiologist: Lonni Cash, MD  Chart reviewed as part of pre-operative protocol coverage. Given past medical history and time since last visit, based on ACC/AHA guidelines, Dyneisha  D Adames would be at acceptable risk for the planned procedure without further cardiovascular testing.   Patient was advised that if he/she*** develops new symptoms prior to surgery to contact our office to arrange a follow-up appointment.  He verbalized understanding.  I will route this recommendation to the requesting party via Epic fax function and remove from pre-op pool.       Time:   Today, I have spent *** minutes with the patient with telehealth technology discussing medical history, symptoms, and management plan.  I spent 10 minutes reviewing patient's past cardiac history and cardiac medications.    [name] CHRISTELLA Beauvais, NP  09/11/2023, 8:10 AM

## 2023-09-12 ENCOUNTER — Ambulatory Visit: Attending: Cardiology

## 2023-09-12 DIAGNOSIS — Z01810 Encounter for preprocedural cardiovascular examination: Secondary | ICD-10-CM

## 2023-09-12 DIAGNOSIS — S52571A Other intraarticular fracture of lower end of right radius, initial encounter for closed fracture: Secondary | ICD-10-CM | POA: Diagnosis not present

## 2023-09-15 ENCOUNTER — Encounter (HOSPITAL_COMMUNITY): Admission: RE | Payer: Self-pay | Source: Home / Self Care

## 2023-09-15 ENCOUNTER — Ambulatory Visit (HOSPITAL_COMMUNITY): Admission: RE | Admit: 2023-09-15 | Source: Home / Self Care | Admitting: Neurological Surgery

## 2023-09-15 SURGERY — ANTERIOR CERVICAL DECOMPRESSION/DISCECTOMY FUSION 1 LEVEL
Anesthesia: General

## 2023-09-17 ENCOUNTER — Ambulatory Visit: Admitting: "Endocrinology

## 2023-09-25 ENCOUNTER — Other Ambulatory Visit: Payer: Self-pay | Admitting: Adult Health

## 2023-09-25 DIAGNOSIS — S62101D Fracture of unspecified carpal bone, right wrist, subsequent encounter for fracture with routine healing: Secondary | ICD-10-CM | POA: Diagnosis not present

## 2023-10-06 ENCOUNTER — Ambulatory Visit: Admitting: "Endocrinology

## 2023-10-22 DIAGNOSIS — S62101D Fracture of unspecified carpal bone, right wrist, subsequent encounter for fracture with routine healing: Secondary | ICD-10-CM | POA: Diagnosis not present

## 2023-10-27 ENCOUNTER — Other Ambulatory Visit: Payer: Self-pay | Admitting: Adult Health

## 2023-10-28 ENCOUNTER — Telehealth: Payer: Self-pay | Admitting: Adult Health

## 2023-10-28 NOTE — Telephone Encounter (Signed)
 Called [facility] and advised that I did not see a fax for pt. Per dental representative, she will fax the form again.

## 2023-10-28 NOTE — Telephone Encounter (Unsigned)
 Copied from CRM [id number]. Topic: Clinical - Medical Advice >> Oct 28, 2023 11:35 AM Terri MATSU wrote: Reason for CRM: Patient stated [facility] sent a medical release form  on 9/9 to have patient tooth taking out and wanted to know if yall received the form. She stated they were going to do the appointment tomorrow so she needs that form sent back to them ASAP. Callback number [phone]

## 2023-10-29 ENCOUNTER — Ambulatory Visit: Admitting: Adult Health

## 2023-10-30 ENCOUNTER — Ambulatory Visit: Admitting: Adult Health

## 2023-10-31 ENCOUNTER — Ambulatory Visit: Payer: Self-pay

## 2023-10-31 ENCOUNTER — Ambulatory Visit: Admitting: Adult Health

## 2023-10-31 NOTE — Telephone Encounter (Signed)
 FYI Only or Action Required?: FYI only for provider. Recommended to the ED due to falls and hitting her head today.   Patient was last seen in primary care on 08/26/2023 by Merna Huxley, NP.  Called Nurse Triage reporting Fall.  Symptoms began today.  Interventions attempted: Nothing.  Symptoms are: gradually worsening.  Triage Disposition: Go to ED Now (or PCP Triage)  Patient/caregiver understands and will follow disposition?: Yes  Copied from CRM [id number]. Topic: Clinical - Red Word Triage >> Oct 31, 2023  2:31 PM Larissa S wrote: [facility] that prompted transfer to Nurse Triage: pt fell today and  hit head, double vision    ----------------------------------------------------------------------- From previous Reason for Contact - Scheduling: Patient/patient representative is calling to schedule an appointment. Refer to attachments for appointment information. Reason for Disposition  Patient sounds very sick or weak to the triager  Answer Assessment - Initial Assessment Questions Patient reports falling this morning and hitting her head on the walker. Patient reports pain to the left side of face. Patient called triage due to falling a second time today trying to get up from the toilet. Patient reports that she fell backwards and hit her head on the tub. No LOC, no blood thinners but patient reports this is the fourth fall she has had this week. Patient is recommended to the ED for evaluation.    1. MECHANISM: How did the fall happen?     Patient fell getting off the toilet and hitting her head on the tub. patient reports falling also this morning when she got up out of bed-hit her head on her walker.  2. DOMESTIC VIOLENCE AND ELDER ABUSE SCREENING: Did you fall because someone pushed you or tried to hurt you? If Yes, ask: Are you safe now?     no 3. ONSET: When did the fall happen? (e.g., minutes, hours, or days ago)     [place] today 4. LOCATION: What part of the body  hit the ground? (e.g., back, buttocks, head, hips, knees, hands, head, stomach)     head 5. INJURY: Did you hurt (injure) yourself when you fell? If Yes, ask: What did you injure? Tell me more about this? (e.g., body area; type of injury; pain severity)     Patient states she has fallen 4 times this week and has hit her head.  6. PAIN: Is there any pain? If Yes, ask: How bad is the pain? (e.g., Scale 0-10; or none, mild,      Generalized pain due to falls. Moderate to severe 7. SIZE: For cuts, bruises, or swelling, ask: How large is it? (e.g., inches or centimeters)      Has bruises from falling 9. OTHER SYMPTOMS: Do you have any other symptoms? (e.g., dizziness, fever, weakness; new-onset or worsening).      no 10. CAUSE: What do you think caused the fall (or falling)? (e.g., dizzy spell, tripped)       Lost her balance  Protocols used: Falls and [facility]

## 2023-10-31 NOTE — Telephone Encounter (Signed)
 Spoke to pt and she stated she will go to the ED if her sx worsen. Pt stated that she has 2 knots on her head but she is ok for now. I advised pt to go to ED since she hit her head and is hurting all over as well as taking aspirin  pt declined. Pt stated she has not taking aspirin  in a while. Pt will keep us  posted.

## 2023-11-03 ENCOUNTER — Ambulatory Visit: Payer: Self-pay

## 2023-11-03 NOTE — Telephone Encounter (Signed)
 FYI Only or Action Required?: Action required by provider: request for appointment.  Patient was last seen in primary care on 08/26/2023 by Merna Huxley, NP.  Called Nurse Triage reporting Herpes Zoster.  Symptoms began several months ago.  Interventions attempted: Nothing.  Symptoms are: gradually worsening.  Triage Disposition: See PCP Within 2 Weeks  Patient/caregiver understands and will follow disposition?: YesCopied from CRM [id number]. Topic: Clinical - Red Word Triage >> Nov 03, 2023  1:14 PM Paige D wrote: Red Word that prompted transfer to Nurse Triage: Shingles on back that wont go away and pt says they itch and are painful. Reason for Disposition  [1] Shingles rash already diagnosed AND [2] weak immune system (e.g., HIV positive, cancer chemotherapy, chronic steroid treatment, splenectomy) AND [3] taking antiviral medication  Answer Assessment - Initial Assessment Questions  They wont heal because I pick at them. They are open wounds. They are oozing clear fluid. Pt fell over walker. I think I broke my left pinky. I keep bumping my head too. Seeing double  for last year. I think that's why I'm bumping head and falling. I' suppose to have neck surgery but had to wait for broken wrist to heal. Pt only wants to see PCP. Appt 9/24.     1. APPEARANCE of RASH: What does the rash look like?      Open wounds-red-  2. LOCATION: Where is the rash located?      Neck and right shoulder  3. ONSET: When did the rash start?      july 4. ITCHING: Does the rash itch? If Yes, ask: How bad is the itch?  (Scale 1-10; or mild, moderate, severe)     mild 5. PAIN: Does the rash hurt? If Yes, ask: How bad is the pain?  (Scale 0-10; or none, mild, moderate, severe)     Mild-moderate  6. OTHER SYMPTOMS: Do you have any other symptoms? (e.g., fever)     Shoulder hurts to touch  Protocols used: Shingles (Zoster)-A-AH

## 2023-11-05 ENCOUNTER — Ambulatory Visit (INDEPENDENT_AMBULATORY_CARE_PROVIDER_SITE_OTHER): Admitting: Adult Health

## 2023-11-05 ENCOUNTER — Ambulatory Visit (INDEPENDENT_AMBULATORY_CARE_PROVIDER_SITE_OTHER)
Admission: RE | Admit: 2023-11-05 | Discharge: 2023-11-05 | Disposition: A | Discharge status: Home / Self Care | Source: Ambulatory Visit | Attending: Adult Health | Admitting: Adult Health

## 2023-11-05 ENCOUNTER — Encounter: Payer: Self-pay | Admitting: Adult Health

## 2023-11-05 VITALS — BP 170/80 | HR 69 | Temp 98.0°F

## 2023-11-05 DIAGNOSIS — M79642 Pain in left hand: Secondary | ICD-10-CM | POA: Diagnosis not present

## 2023-11-05 DIAGNOSIS — M25511 Pain in right shoulder: Secondary | ICD-10-CM

## 2023-11-05 DIAGNOSIS — T148XXA Other injury of unspecified body region, initial encounter: Secondary | ICD-10-CM | POA: Diagnosis not present

## 2023-11-05 DIAGNOSIS — H539 Unspecified visual disturbance: Secondary | ICD-10-CM

## 2023-11-05 DIAGNOSIS — M5412 Radiculopathy, cervical region: Secondary | ICD-10-CM | POA: Diagnosis not present

## 2023-11-05 DIAGNOSIS — R2681 Unsteadiness on feet: Secondary | ICD-10-CM

## 2023-11-05 DIAGNOSIS — M79641 Pain in right hand: Secondary | ICD-10-CM | POA: Diagnosis not present

## 2023-11-05 DIAGNOSIS — I10 Essential (primary) hypertension: Secondary | ICD-10-CM

## 2023-11-05 DIAGNOSIS — S62647A Nondisplaced fracture of proximal phalanx of left little finger, initial encounter for closed fracture: Secondary | ICD-10-CM | POA: Diagnosis not present

## 2023-11-05 MED ORDER — CEPHALEXIN 500 MG PO CAPS: 500.0000 mg | ORAL_CAPSULE | Freq: Two times a day (BID) | ORAL | 0 refills | Status: AC

## 2023-11-05 NOTE — Patient Instructions (Signed)
 I am going to order an MRI of your brain   I am going to refer you to Neurology for your falls  Please call Neurosurgery to schedule your surgery   I have sent in an antibiotic called Doxycycline    Take all of your medications as directed

## 2023-11-05 NOTE — Progress Notes (Signed)
 Subjective:    Patient ID: Laurie [name], female    DOB: 05/20/1955, 68 y.o.   MRN: [id number]  HPI 68 year old female who  has a past medical history of Allergy, Anxiety, Arthritis, Asthma, Chronic back pain, COPD (chronic obstructive pulmonary disease) (HCC), Depression, Esophageal stricture, GERD (gastroesophageal reflux disease), Heart murmur, Hematemesis (09/10/2018), Hyperlipidemia, Hypertension, IBS (irritable bowel syndrome), Interstitial cystitis, Neuromuscular disorder (HCC), and PONV (postoperative nausea and vomiting).  She presents to the office today for multiple issues.  He was seen back in July 2025 for suspected shingles on the back of her right shoulder and neck.  She was placed on antiviral therapy and completed this.  Today she reports that she continues to have a rash area.  That she she picks at it a lot  Last year she has had numerous falls, was supposed to have an anterior cervical decompression/discectomy by Dr. Colon but this had to be canceled.  She had a fall causing a closed fracture of her right wrist.  She reports that this week alone she has had 5 falls may be more.  She did hit her head during at least one of the falls and also injured her right shoulder and left pinky finger.  She does endorse using a walker at home but feels as though she is losing balance and will fall over her walker.  Thankfully she has not  restarted her Plavix  which I stopped multiple months ago due to the amount of falls she was having.   She also has been experiencing worsening vision in her left eye.  Her vision seems blurry.  This has been an ongoing issue and she does report being seen by her eye doctor ( I do not have these notes) reports that they could not find anything wrong.     Review of Systems See HPI   Past Medical History:  Diagnosis Date   Allergy    Anxiety    Arthritis    RA   Asthma    Chronic back pain    COPD (chronic obstructive pulmonary disease)  (HCC)    Depression    Esophageal stricture    GERD (gastroesophageal reflux disease)    Heart murmur    Hematemesis 09/10/2018   Hyperlipidemia    Hypertension    IBS (irritable bowel syndrome)    Interstitial cystitis    Neuromuscular disorder (HCC)    fibromyalgia   PONV (postoperative nausea and vomiting)     Social History   Socioeconomic History   Marital status: Single    Spouse name: Not on file   Number of children: Not on file   Years of education: Not on file   Highest education level: Not on file  Occupational History   Occupation: disability  Tobacco Use   Smoking status: Every Day    Current packs/day: 0.50    Types: Cigarettes   Smokeless tobacco: Never   Tobacco comments:    trying to quit   Vaping Use   Vaping status: Never Used  Substance and Sexual Activity   Alcohol use: Never    Comment: occasional   Drug use: Yes    Types: Marijuana    Comment: occas   Sexual activity: Not on file  Other Topics Concern   Not on file  Social History Narrative   Not on file   Social Drivers of Health   Financial Resource Strain: Low Risk  (06/17/2022)   Overall Financial Resource Strain (CARDIA)  Difficulty of Paying Living Expenses: Not hard at all  Food Insecurity: No Food Insecurity (07/15/2022)   Hunger Vital Sign    Worried About Running Out of Food in the Last Year: Never true    Ran Out of Food in the Last Year: Never true  Transportation Needs: No Transportation Needs (06/17/2022)   PRAPARE - Administrator, Civil Service (Medical): No    Lack of Transportation (Non-Medical): No  Physical Activity: Sufficiently Active (06/17/2022)   Exercise Vital Sign    Days of Exercise per Week: 7 days    Minutes of Exercise per Session: 30 min  Stress: No Stress Concern Present (06/17/2022)   [employer] of Occupational Health - Occupational Stress Questionnaire    Feeling of Stress : Not at all  Social Connections: Socially Integrated  (06/17/2022)   Social Connection and Isolation Panel    Frequency of Communication with Friends and Family: More than three times a week    Frequency of Social Gatherings with Friends and Family: More than three times a week    Attends Religious Services: More than 4 times per year    Active Member of Clubs or Organizations: Yes    Attends Banker Meetings: More than 4 times per year    Marital Status: Living with partner  Intimate Partner Violence: Not At Risk (06/17/2022)   Humiliation, Afraid, Rape, and Kick questionnaire    Fear of Current or Ex-Partner: No    Emotionally Abused: No    Physically Abused: No    Sexually Abused: No    Past Surgical History:  Procedure Laterality Date   ABDOMINAL HYSTERECTOMY     APPENDECTOMY     BIOPSY  09/11/2018   Procedure: BIOPSY;  Surgeon: Wilhelmenia, Aloha Raddle., MD;  Location: [facility] ENDOSCOPY;  Service: Gastroenterology;;   CERVICAL FUSION     CERVICAL LAMINECTOMY     CORONARY STENT INTERVENTION N/A 10/07/2019   Procedure: CORONARY STENT INTERVENTION;  Surgeon: Verlin Lonni BIRCH, MD;  Location: [facility] INVASIVE CV LAB;  Service: Cardiovascular;  Laterality: N/A;   ESOPHAGOGASTRODUODENOSCOPY     ESOPHAGOGASTRODUODENOSCOPY (EGD) WITH PROPOFOL  N/A 09/11/2018   Procedure: ESOPHAGOGASTRODUODENOSCOPY (EGD) WITH PROPOFOL ;  Surgeon: Wilhelmenia Aloha Raddle., MD;  Location: [facility] ENDOSCOPY;  Service: Gastroenterology;  Laterality: N/A;   FINGER SURGERY     KYPHOPLASTY N/A 03/29/2021   Procedure: Lumbar Three KYPHOPLASTY;  Surgeon: Colon Shove, MD;  Location: [facility] OR;  Service: Neurosurgery;  Laterality: N/A;   LEFT HEART CATH AND CORONARY ANGIOGRAPHY N/A 10/07/2019   Procedure: LEFT HEART CATH AND CORONARY ANGIOGRAPHY;  Surgeon: Verlin Lonni BIRCH, MD;  Location: [facility] INVASIVE CV LAB;  Service: Cardiovascular;  Laterality: N/A;   LEFT HEART CATH AND CORONARY ANGIOGRAPHY N/A 06/20/2022   Procedure: LEFT HEART CATH AND CORONARY ANGIOGRAPHY;  Surgeon:  Verlin Lonni BIRCH, MD;  Location: [facility] INVASIVE CV LAB;  Service: Cardiovascular;  Laterality: N/A;   LUMBAR FUSION     SAVORY DILATION N/A 09/11/2018   Procedure: SAVORY DILATION;  Surgeon: Wilhelmenia Aloha Raddle., MD;  Location: [facility] ENDOSCOPY;  Service: Gastroenterology;  Laterality: N/A;   TONSILLECTOMY      Family History  Problem Relation Age of Onset   Dementia Mother    Heart attack Father    Coronary artery disease Father    Cancer Brother        esophageal   Esophageal cancer Brother    Coronary artery disease Paternal Aunt    Stomach cancer Paternal Aunt  Coronary artery disease Paternal Grandmother    Aneurysm Brother        aortic   Rectal cancer Neg Hx    Colon cancer Neg Hx    Thyroid  disease Neg Hx     Allergies  Allergen Reactions   Codeine Nausea And Vomiting   Lisinopril  Cough   Misc. Sulfonamide Containing Compounds    Sulfonamide Derivatives Nausea And Vomiting   Tetracyclines & Related Rash    Current Outpatient Medications on File Prior to Visit  Medication Sig Dispense Refill   albuterol  (VENTOLIN  HFA) 108 (90 Base) MCG/ACT inhaler INHALE TWO (2) PUFFS BY MOUTH EVERY 6 HOURS AS NEEDED FOR SHORTNESS OF BREATH AND FOR WHEEZING 8.5 g 11   atorvastatin  (LIPITOR) 40 MG tablet TAKE 1 TABLET BY MOUTH EACH EVENING 30 tablet 11   fenofibrate  (TRICOR ) 145 MG tablet TAKE 1 TABLET BY MOUTH EACH EVENING 30 tablet 11   fluticasone  (FLONASE ) 50 MCG/ACT nasal spray INSTILL TWO (2) SPRAYS IN EACH NOSTRIL DAILY 16 g 11   furosemide  (LASIX ) 20 MG tablet TAKE ONE TABLET BY MOUTH ONCE Daily 90 tablet 1   HYDROcodone -acetaminophen  (NORCO/VICODIN) 5-325 MG tablet Take 1 tablet by mouth every 6 (six) hours as needed. 5 tablet 0   methadone  (DOLOPHINE ) 10 MG/ML solution Take 80 mg by mouth daily.     methimazole  (TAPAZOLE ) 10 MG tablet Take 1 tablet (10 mg total) by mouth 2 (two) times daily. TAKE 1 TABLET BY MOUTH EACH EVENING 180 tablet 1   metoprolol  succinate  (TOPROL -XL) 50 MG 24 hr tablet TAKE 1 TABLET BY MOUTH EVERY MORNING WITH OR IMMEDIATELY FOLLOWING A MEAL 30 tablet 11   naproxen  (NAPROSYN ) 500 MG tablet Take 1 tablet (500 mg total) by mouth 2 (two) times daily with a meal. 30 tablet 0   nitroGLYCERIN  (NITROSTAT ) 0.4 MG SL tablet Place 1 tablet (0.4 mg total) under the tongue every 5 (five) minutes as needed for chest pain. 25 tablet 3   olmesartan  (BENICAR ) 40 MG tablet Take 1 tablet (40 mg total) by mouth daily. 90 tablet 3   pantoprazole  (PROTONIX ) 40 MG tablet TAKE 1 TABLET BY MOUTH EVERY MORNING 30 tablet 10   spironolactone  (ALDACTONE ) 25 MG tablet Take 1 tablet (25 mg total) by mouth daily. 90 tablet 3   venlafaxine  XR (EFFEXOR  XR) 75 MG 24 hr capsule Take with 150 mg dose 90 capsule 1   venlafaxine  XR (EFFEXOR -XR) 150 MG 24 hr capsule TAKE 1 CAPSULE BY MOUTH EVERY MORNING 30 capsule 11   aspirin  EC 81 MG tablet Take 1 tablet (81 mg total) by mouth daily. Swallow whole. (Patient not taking: Reported on 11/05/2023) 90 tablet 3   clopidogrel  (PLAVIX ) 75 MG tablet Take 1 tablet (75 mg total) by mouth daily. (Patient not taking: Reported on 11/05/2023) 90 tablet 3   meclizine  (ANTIVERT ) 25 MG tablet Take 1 tablet (25 mg total) by mouth 3 (three) times daily as needed for dizziness. (Patient not taking: Reported on 11/05/2023) 30 tablet 0   No current facility-administered medications on file prior to visit.    BP (!) 170/80   Pulse 69   Temp 98 F (36.7 C) (Oral)   SpO2 99%       Objective:   Physical Exam Constitutional:      Appearance: Normal appearance. She is ill-appearing (chronically).  HENT:     Head:   Eyes:     Extraocular Movements:     Right eye: Normal extraocular motion and  no nystagmus.     Left eye: Normal extraocular motion and no nystagmus.  Cardiovascular:     Rate and Rhythm: Normal rate and regular rhythm.     Pulses: Normal pulses.     Heart sounds: Normal heart sounds.  Pulmonary:     Effort: Pulmonary  effort is normal.     Breath sounds: Normal breath sounds.  Musculoskeletal:     Right shoulder: Bony tenderness present. Decreased range of motion. Decreased strength.     Left hand: Swelling and bony tenderness present. Decreased range of motion. There is no disruption of two-point discrimination. Normal capillary refill.     Comments: She has mild swelling with bruising to her left hand/pinkie. She does have normal ROM  Bruising with decreased ROM to right shoulder. She does not have any deformity   Skin:    Findings: Bruising, erythema and wound present.  Neurological:     General: No focal deficit present.     Mental Status: She is alert and oriented to person, place, and time.  Psychiatric:        Mood and Affect: Mood normal.        Behavior: Behavior normal.        Thought Content: Thought content normal.        Judgment: Judgment normal.       Assessment & Plan:  1. Open wound (Primary) - Will send in Keflex  since she has an allergy to Doxycycline . Advised benadryl  cream to help with the itchiness  - cephALEXin  (KEFLEX ) 500 MG capsule; Take 1 capsule (500 mg total) by mouth 2 (two) times daily.  Dispense: 14 capsule; Refill: 0  2. Acute pain of right shoulder  - DG Shoulder Right; Future  3. Left hand pain  - DG Hand Complete Left; Future  4. Gait instability - The amount of falls she is having is very concerning. I am going to refer her for an MRI of the brain and to Neurology for further evaluation. I would like her to do PT and she will think about this  - MR Brain Wo Contrast; Future - Ambulatory referral to Neurology  5. CERVICAL RADICULOPATHY - Follow up with Neurosurgery   6. Abnormal vision  - MR Brain Wo Contrast; Future  7. Essential hypertension -- Elevated in the office. She was not sure what medications she was taking( which may be contributing to falls). I printed off a new medication list of what she should be taking   Nayab Aten, NP  I  personally spent a total of 44 minutes in the care of the patient today including preparing to see the patient, getting/reviewing separately obtained history, performing a medically appropriate exam/evaluation, counseling and educating, placing orders, and documenting clinical information in the EHR.

## 2023-11-06 ENCOUNTER — Other Ambulatory Visit: Payer: Self-pay | Admitting: Neurological Surgery

## 2023-11-07 ENCOUNTER — Other Ambulatory Visit: Payer: Self-pay | Admitting: Neurological Surgery

## 2023-11-11 ENCOUNTER — Ambulatory Visit: Payer: Self-pay | Admitting: Adult Health

## 2023-11-11 ENCOUNTER — Other Ambulatory Visit: Payer: Self-pay | Admitting: Adult Health

## 2023-11-11 DIAGNOSIS — S62647A Nondisplaced fracture of proximal phalanx of left little finger, initial encounter for closed fracture: Secondary | ICD-10-CM

## 2023-11-17 NOTE — Telephone Encounter (Signed)
 Copied from CRM [id number]. Topic: General - Other >> Oct 28, 2023  1:25 PM Turkey A wrote: Reason for CRM: Patient called and would like for document for approval of her Dental work to be comleted >> Oct 28, 2023  2:17 PM Margarett M wrote: Call pt and left a message about her paper work

## 2023-11-20 ENCOUNTER — Encounter (HOSPITAL_COMMUNITY): Payer: Self-pay

## 2023-11-20 ENCOUNTER — Telehealth (HOSPITAL_BASED_OUTPATIENT_CLINIC_OR_DEPARTMENT_OTHER): Payer: Self-pay | Admitting: *Deleted

## 2023-11-20 NOTE — Pre-Procedure Instructions (Signed)
 Surgical Instructions   Your procedure is scheduled on November 25, 2023. Report to [facility] Main Entrance A at 5:30 A.M., then check in with the Admitting office. Any questions or running late day of surgery: call [phone]  Questions prior to your surgery date: call [phone], Monday-Friday, 8am-4pm. If you experience any cold or flu symptoms such as cough, fever, chills, shortness of breath, etc. between now and your scheduled surgery, please notify us  at the above number.     Remember:  Do not eat or drink after midnight the night before your surgery    Take these medicines the morning of surgery with A SIP OF WATER: fluticasone  (FLONASE ) 50 MCG/ACT nasal spray  methadone  (DOLOPHINE )  methimazole  (TAPAZOLE )  metoprolol  succinate (TOPROL -XL)  pantoprazole  (PROTONIX )  venlafaxine  XR (EFFEXOR  XR)    May take these medicines IF NEEDED: albuterol  (VENTOLIN  HFA) inhaler - please bring inhaler with you morning of surgery meclizine  (ANTIVERT )  nitroGLYCERIN  (NITROSTAT ) - if dose taken prior to surgery, please call [phone]   Follow your surgeon's instructions on when to stop Aspirin  and clopidogrel  (PLAVIX ).  If no instructions were given by your surgeon then you will need to call the office to get those instructions.     One week prior to surgery, STOP taking any Aleve , Naproxen , Ibuprofen , Motrin , Advil , Goody's, BC's, all herbal medications, fish oil, and non-prescription vitamins.                     Do NOT Smoke (Tobacco/Vaping) for 24 hours prior to your procedure.  If you use a CPAP at night, you may bring your mask/headgear for your overnight stay.   You will be asked to remove any contacts, glasses, piercing's, hearing aid's, dentures/partials prior to surgery. Please bring cases for these items if needed.    Patients discharged the day of surgery will not be allowed to drive home, and someone needs to stay with them for 24 hours.  SURGICAL WAITING ROOM  VISITATION Patients may have no more than 2 support people in the waiting area - these visitors may rotate.   Pre-op nurse will coordinate an appropriate time for 1 ADULT support person, who may not rotate, to accompany patient in pre-op.  Children under the age of 50 must have an adult with them who is not the patient and must remain in the main waiting area with an adult.  If the patient needs to stay at the hospital during part of their recovery, the visitor guidelines for inpatient rooms apply.  Please refer to the [facility] website for the visitor guidelines for any additional information.   If you received a COVID test during your pre-op visit  it is requested that you wear a mask when out in public, stay away from anyone that may not be feeling well and notify your surgeon if you develop symptoms. If you have been in contact with anyone that has tested positive in the last 10 days please notify you surgeon.      Pre-operative 4 CHG Bathing Instructions   You can play a key role in reducing the risk of infection after surgery. Your skin needs to be as free of germs as possible. You can reduce the number of germs on your skin by washing with CHG (chlorhexidine  gluconate) soap before surgery. CHG is an antiseptic soap that kills germs and continues to kill germs even after washing.   DO NOT use if you have an allergy to chlorhexidine /CHG or antibacterial  soaps. If your skin becomes reddened or irritated, stop using the CHG and notify one of our RNs at [phone].   Please shower with the CHG soap starting 4 days before surgery using the following schedule:     Please keep in mind the following:  DO NOT shave, including legs and underarms, starting the day of your first shower.   You may shave your face at any point before/day of surgery.  Place clean sheets on your bed the day you start using CHG soap. Use a clean washcloth (not used since being washed) for each shower. DO NOT  sleep with pets once you start using the CHG.   CHG Shower Instructions:  Wash your face and private area with normal soap. If you choose to wash your hair, wash first with your normal shampoo.  After you use shampoo/soap, rinse your hair and body thoroughly to remove shampoo/soap residue.  Turn the water OFF and apply  bottle of CHG soap to a CLEAN washcloth.  Apply CHG soap ONLY FROM YOUR NECK DOWN TO YOUR TOES (washing for 3-5 minutes)  DO NOT use CHG soap on face, private areas, open wounds, or sores.  Pay special attention to the area where your surgery is being performed.  If you are having back surgery, having someone wash your back for you may be helpful. Wait 2 minutes after CHG soap is applied, then you may rinse off the CHG soap.  Pat dry with a clean towel  Put on clean clothes/pajamas   If you choose to wear lotion, please use ONLY the CHG-compatible lotions that are listed below.  Additional instructions for the day of surgery:  If you choose, you may shower the morning of surgery with an antibacterial soap.  DO NOT APPLY any lotions, deodorants, cologne, or perfumes.   Do not bring valuables to the hospital. [facility] is not responsible for any belongings/valuables. Do not wear nail polish, gel polish, artificial nails, or any other type of covering on natural nails (fingers and toes) Do not wear jewelry or makeup Put on clean/comfortable clothes.  Please brush your teeth.  Ask your nurse before applying any prescription medications to the skin.     CHG Compatible Lotions   Aveeno Moisturizing lotion  Cetaphil Moisturizing Cream  Cetaphil Moisturizing Lotion  Clairol Herbal Essence Moisturizing Lotion, Dry Skin  Clairol Herbal Essence Moisturizing Lotion, Extra Dry Skin  Clairol Herbal Essence Moisturizing Lotion, Normal Skin  Curel Age Defying Therapeutic Moisturizing Lotion with Alpha Hydroxy  Curel Extreme Care Body Lotion  Curel Soothing Hands Moisturizing  Hand Lotion  Curel Therapeutic Moisturizing Cream, Fragrance-Free  Curel Therapeutic Moisturizing Lotion, Fragrance-Free  Curel Therapeutic Moisturizing Lotion, Original Formula  Eucerin Daily Replenishing Lotion  Eucerin Dry Skin Therapy Plus Alpha Hydroxy Crme  Eucerin Dry Skin Therapy Plus Alpha Hydroxy Lotion  Eucerin Original Crme  Eucerin Original Lotion  Eucerin Plus Crme Eucerin Plus Lotion  Eucerin TriLipid Replenishing Lotion  Keri Anti-Bacterial Hand Lotion  Keri Deep Conditioning Original Lotion Dry Skin Formula Softly Scented  Keri Deep Conditioning Original Lotion, Fragrance Free Sensitive Skin Formula  Keri Lotion Fast Absorbing Fragrance Free Sensitive Skin Formula  Keri Lotion Fast Absorbing Softly Scented Dry Skin Formula  Keri Original Lotion  Keri Skin Renewal Lotion Keri Silky Smooth Lotion  Keri Silky Smooth Sensitive Skin Lotion  Nivea Body Creamy Conditioning Oil  Nivea Body Extra Enriched Building services engineer Sheer Moisturizing Lotion Nivea Crme  Nivea Skin Firming Lotion  NutraDerm 30 Skin Lotion  NutraDerm Skin Lotion  NutraDerm Therapeutic Skin Cream  NutraDerm Therapeutic Skin Lotion  ProShield Protective Hand Cream  Provon moisturizing lotion  Please read over the following fact sheets that you were given.

## 2023-11-20 NOTE — Telephone Encounter (Signed)
   Name: Laurie [name]  DOB: [birth year]  MRN: [id number]  Primary Cardiologist: Lonni Cash, MD   Preoperative team, please contact this patient and set up a phone call appointment for further preoperative risk assessment. Please obtain consent and complete medication review. Thank you for your help.  I confirm that guidance regarding antiplatelet and oral anticoagulation therapy has been completed and, if necessary, noted below.  Her aspirin  may be held for 7 days prior to her procedure if necessary.  Her Plavix  may be held for 5 days prior to her procedure.  Please resume aspirin  and Plavix  as soon as hemostasis is achieved.  I also confirmed the patient resides in the state of [place] . As per [facility] Medical Board telemedicine laws, the patient must reside in the state in which the provider is licensed.   [name] CHRISTELLA Beauvais, NP 11/20/2023, 10:42 AM [facility]

## 2023-11-20 NOTE — Telephone Encounter (Signed)
   Pre-operative Risk Assessment    Patient Name: Laurie [name]  DOB: 1955/04/22 MRN: [id number]   Date of last office visit: 01/06/23 ROSALINE BANE, NP Date of next office visit: NONE  Hodgin, Lisa C, RN FROM PAT TEAM SENT ME A STAFF MESSAGE: [name], RN  [name], CMA [name], I work in pre-admission testing at [facility]. This pt is coming in tomorrow for her pre-op appointment for an ACDF spine surgery on 10/14. We noticed that she received cardiac clearance for a recent wrist fracture surgery and was wondering if someone could try to get clearance in for this upcoming surgery as well.  I saw [name] attempted to call after the wrist surgery, but it was post-op and not available to complete. Not sure if his call was clearance for the spine surgery or something else.  Thanks,  Olam Eyvonne, RN  I THEN CALLED [name],SURGERY SCHEDULER WITH DR. COLON. TOLD [name] ABOUT THE MESSAGE I RECEIVED TODAY FROM PAT TEAM. PER [name], STATES DR. ELSNER SAID JUST NEEDED MEDICAL CLEARANCE AND NOT CARDIAC. THOUGH THE PAT TEAM MAY HAVE RECEIVED A MESSAGE FROM ANESTHESIOLOGIST TO BE SURE THERE IS A CARDIAC CLEARANCE AS WELL AS THE MEDICAL FROM PCP.      Request for Surgical Clearance    Procedure:  ACDF C4-5  Date of Surgery:  Clearance 11/25/23                                Surgeon:  DR. VICTORY COLON Surgeon's Group or Practice Name:  [facility] Phone number:  [phone] Fax number:  [phone] ATTN: [name]   Type of Clearance Requested:   - Medical  - Pharmacy:  Hold Aspirin  and Clopidogrel  (Plavix ) SIDE NOTE ON PLAVIX  IN MEDLIST (NOT TAKING)   Type of Anesthesia:  General    Additional requests/questions:    [name] [name]   11/20/2023, 10:26 AM

## 2023-11-20 NOTE — Telephone Encounter (Signed)
 Left message for the pt to call back to schedule tele preop appt. Held slot 10/10 @ 2:40 for the pt.

## 2023-11-21 ENCOUNTER — Encounter (HOSPITAL_COMMUNITY): Payer: Self-pay

## 2023-11-21 ENCOUNTER — Telehealth (HOSPITAL_BASED_OUTPATIENT_CLINIC_OR_DEPARTMENT_OTHER): Payer: Self-pay | Admitting: *Deleted

## 2023-11-21 ENCOUNTER — Encounter (HOSPITAL_COMMUNITY)
Admission: RE | Admit: 2023-11-21 | Discharge: 2023-11-21 | Disposition: A | Discharge status: Home / Self Care | Source: Ambulatory Visit | Attending: Neurological Surgery | Admitting: Neurological Surgery

## 2023-11-21 ENCOUNTER — Other Ambulatory Visit: Payer: Self-pay

## 2023-11-21 ENCOUNTER — Ambulatory Visit: Attending: General Practice

## 2023-11-21 ENCOUNTER — Encounter (HOSPITAL_COMMUNITY): Payer: Self-pay | Admitting: Vascular Surgery

## 2023-11-21 VITALS — BP 154/61 | HR 56 | Temp 97.7°F | Resp 16 | Ht 70.0 in | Wt 154.0 lb

## 2023-11-21 DIAGNOSIS — L981 Factitial dermatitis: Secondary | ICD-10-CM | POA: Insufficient documentation

## 2023-11-21 DIAGNOSIS — Z01818 Encounter for other preprocedural examination: Secondary | ICD-10-CM

## 2023-11-21 DIAGNOSIS — M5382 Other specified dorsopathies, cervical region: Secondary | ICD-10-CM | POA: Diagnosis not present

## 2023-11-21 DIAGNOSIS — Z01812 Encounter for preprocedural laboratory examination: Secondary | ICD-10-CM | POA: Insufficient documentation

## 2023-11-21 DIAGNOSIS — B028 Zoster with other complications: Secondary | ICD-10-CM | POA: Insufficient documentation

## 2023-11-21 DIAGNOSIS — Z01810 Encounter for preprocedural cardiovascular examination: Secondary | ICD-10-CM

## 2023-11-21 HISTORY — DX: Atherosclerotic heart disease of native coronary artery without angina pectoris: I25.10

## 2023-11-21 LAB — BASIC METABOLIC PANEL WITH GFR
Anion gap: 10 (ref 5–15)
BUN: 23 mg/dL (ref 8–23)
CO2: 28 mmol/L (ref 22–32)
Calcium: 9.1 mg/dL (ref 8.9–10.3)
Chloride: 101 mmol/L (ref 98–111)
Creatinine, Ser: 1.29 mg/dL — ABNORMAL HIGH (ref 0.44–1.00)
GFR, Estimated: 45 mL/min — ABNORMAL LOW (ref >60)
Glucose, Bld: 84 mg/dL (ref 70–99)
Potassium: 4 mmol/L (ref 3.5–5.1)
Sodium: 139 mmol/L (ref 135–145)

## 2023-11-21 LAB — TYPE AND SCREEN
ABO/RH(D): A POS
Antibody Screen: NEGATIVE

## 2023-11-21 LAB — CBC
HCT: 34 % — ABNORMAL LOW (ref 36.0–46.0)
Hemoglobin: 10.6 g/dL — ABNORMAL LOW (ref 12.0–15.0)
MCH: 28.4 pg (ref 26.0–34.0)
MCHC: 31.2 g/dL (ref 30.0–36.0)
MCV: 91.2 fL (ref 80.0–100.0)
Platelets: 306 K/uL (ref 150–400)
RBC: 3.73 MIL/uL — ABNORMAL LOW (ref 3.87–5.11)
RDW: 13.8 % (ref 11.5–15.5)
WBC: 7.7 K/uL (ref 4.0–10.5)
nRBC: 0 % (ref 0.0–0.2)

## 2023-11-21 LAB — SURGICAL PCR SCREEN
MRSA, PCR: NEGATIVE
Staphylococcus aureus: NEGATIVE

## 2023-11-21 NOTE — Progress Notes (Signed)
 Virtual Visit via Telephone Note   Because of Laurie  D [name] co-morbid illnesses, she is at least at moderate risk for complications without adequate follow up.  This format is felt to be most appropriate for this patient at this time.  Due to technical limitations with video connection (technology), today's appointment will be conducted as an audio only telehealth visit, and Laurie [name] verbally agreed to proceed in this manner.   All issues noted in this document were discussed and addressed.  No physical exam could be performed with this format.  Evaluation Performed:  Preoperative cardiovascular risk assessment _____________   Date:  11/21/2023   Patient ID:  Laurie [name], DOB [birth year], MRN [id number] Patient Location:  Home Provider location:   Office  Primary Care Provider:  Merna Huxley, NP Primary Cardiologist:  Lonni Cash, MD  Chief Complaint / Patient Profile   68 y.o. y/o female with a h/o essential hypertension, CAD, HLD who is pending anterior cervical disc fusion and presents today for telephonic preoperative cardiovascular risk assessment.  History of Present Illness    Laurie [name] is a 68 y.o. female who presents via audio/video conferencing for a telehealth visit today.  Pt was last seen in cardiology clinic on 01/06/2023 by Percy, NP.  At that time Laurie [name] was doing well .  The patient is now pending procedure as outlined above. Since her last visit, she continues to be stable from a cardiac standpoint.  Today she denies chest pain, shortness of breath, lower extremity edema, fatigue, palpitations, melena, hematuria, hemoptysis, diaphoresis, weakness, presyncope, syncope, orthopnea, and PND.   Past Medical History    Past Medical History:  Diagnosis Date   Allergy    Anxiety    Arthritis    RA   Asthma    Chronic back pain    COPD (chronic obstructive pulmonary disease) (HCC)    Coronary artery disease     Depression    Esophageal stricture    GERD (gastroesophageal reflux disease)    Heart murmur    Hematemesis 09/10/2018   Hyperlipidemia    Hypertension    IBS (irritable bowel syndrome)    Interstitial cystitis    Neuromuscular disorder (HCC)    fibromyalgia   PONV (postoperative nausea and vomiting)    Past Surgical History:  Procedure Laterality Date   ABDOMINAL HYSTERECTOMY     APPENDECTOMY     BIOPSY  09/11/2018   Procedure: BIOPSY;  Surgeon: Wilhelmenia Aloha Raddle., MD;  Location: [facility] ENDOSCOPY;  Service: Gastroenterology;;   CERVICAL FUSION     CERVICAL LAMINECTOMY     CORONARY STENT INTERVENTION N/A 10/07/2019   Procedure: CORONARY STENT INTERVENTION;  Surgeon: Cash Lonni BIRCH, MD;  Location: [facility] INVASIVE CV LAB;  Service: Cardiovascular;  Laterality: N/A;   ESOPHAGOGASTRODUODENOSCOPY     ESOPHAGOGASTRODUODENOSCOPY (EGD) WITH PROPOFOL  N/A 09/11/2018   Procedure: ESOPHAGOGASTRODUODENOSCOPY (EGD) WITH PROPOFOL ;  Surgeon: Wilhelmenia Aloha Raddle., MD;  Location: [facility] ENDOSCOPY;  Service: Gastroenterology;  Laterality: N/A;   FINGER SURGERY     KYPHOPLASTY N/A 03/29/2021   Procedure: Lumbar Three KYPHOPLASTY;  Surgeon: Colon Shove, MD;  Location: [facility] OR;  Service: Neurosurgery;  Laterality: N/A;   LEFT HEART CATH AND CORONARY ANGIOGRAPHY N/A 10/07/2019   Procedure: LEFT HEART CATH AND CORONARY ANGIOGRAPHY;  Surgeon: Cash Lonni BIRCH, MD;  Location: [facility] INVASIVE CV LAB;  Service: Cardiovascular;  Laterality: N/A;   LEFT HEART CATH AND CORONARY ANGIOGRAPHY N/A 06/20/2022   Procedure: LEFT HEART CATH  AND CORONARY ANGIOGRAPHY;  Surgeon: Verlin Lonni BIRCH, MD;  Location: [facility] INVASIVE CV LAB;  Service: Cardiovascular;  Laterality: N/A;   LUMBAR FUSION     SAVORY DILATION N/A 09/11/2018   Procedure: SAVORY DILATION;  Surgeon: Wilhelmenia Aloha Raddle., MD;  Location: [facility] ENDOSCOPY;  Service: Gastroenterology;  Laterality: N/A;   TONSILLECTOMY      Allergies  Allergies   Allergen Reactions   Codeine Nausea And Vomiting   Lisinopril  Cough   Misc. Sulfonamide Containing Compounds    Sulfonamide Derivatives Nausea And Vomiting   Tetracyclines & Related Rash    Home Medications    Prior to Admission medications   Medication Sig Start Date End Date Taking? Authorizing Provider  albuterol  (VENTOLIN  HFA) 108 (90 Base) MCG/ACT inhaler INHALE TWO (2) PUFFS BY MOUTH EVERY 6 HOURS AS NEEDED FOR SHORTNESS OF BREATH AND FOR WHEEZING 10/01/23   Nafziger, Darleene, NP  aspirin  EC 81 MG tablet Take 1 tablet (81 mg total) by mouth daily. Swallow whole. 09/27/19   Verlin Lonni BIRCH, MD  atorvastatin  (LIPITOR) 40 MG tablet TAKE 1 TABLET BY MOUTH EACH EVENING 10/29/23   Nafziger, Darleene, NP  cephALEXin  (KEFLEX ) 500 MG capsule Take 1 capsule (500 mg total) by mouth 2 (two) times daily. Patient not taking: Reported on 11/21/2023 11/05/23   Merna Darleene, NP  clopidogrel  (PLAVIX ) 75 MG tablet Take 1 tablet (75 mg total) by mouth daily. Patient not taking: Reported on 11/21/2023 01/06/23   Percy Rosaline HERO, NP  fenofibrate  (TRICOR ) 145 MG tablet TAKE 1 TABLET BY MOUTH EACH EVENING 10/29/23   Nafziger, Darleene, NP  fluticasone  (FLONASE ) 50 MCG/ACT nasal spray INSTILL TWO (2) SPRAYS IN EACH NOSTRIL DAILY 10/01/23   Nafziger, Darleene, NP  furosemide  (LASIX ) 20 MG tablet TAKE ONE TABLET BY MOUTH ONCE Daily 04/11/23   Nafziger, Darleene, NP  HYDROcodone -acetaminophen  (NORCO/VICODIN) 5-325 MG tablet Take 1 tablet by mouth every 6 (six) hours as needed. Patient not taking: Reported on 11/21/2023 08/20/23   Robinson, John K, PA-C  meclizine  (ANTIVERT ) 25 MG tablet Take 1 tablet (25 mg total) by mouth 3 (three) times daily as needed for dizziness. 01/30/23   Kingsley, Victoria K, DO  methadone  (DOLOPHINE ) 10 MG/ML solution Take 115 mg by mouth daily.    [provider]  methimazole  (TAPAZOLE ) 10 MG tablet Take 1 tablet (10 mg total) by mouth 2 (two) times daily. TAKE 1 TABLET BY MOUTH EACH  EVENING 05/08/23 11/21/23  Motwani, Komal, MD  metoprolol  succinate (TOPROL -XL) 50 MG 24 hr tablet TAKE 1 TABLET BY MOUTH EVERY MORNING WITH OR IMMEDIATELY FOLLOWING A MEAL 08/28/23   Nafziger, Darleene, NP  naproxen  (NAPROSYN ) 500 MG tablet Take 1 tablet (500 mg total) by mouth 2 (two) times daily with a meal. Patient not taking: Reported on 11/21/2023 08/26/23   Nafziger, Cory, NP  nitroGLYCERIN  (NITROSTAT ) 0.4 MG SL tablet Place 1 tablet (0.4 mg total) under the tongue every 5 (five) minutes as needed for chest pain. 01/06/23 11/20/23  Swinyer, Rosaline HERO, NP  olmesartan  (BENICAR ) 40 MG tablet Take 1 tablet (40 mg total) by mouth daily. 07/02/23   Nafziger, Darleene, NP  pantoprazole  (PROTONIX ) 40 MG tablet TAKE 1 TABLET BY MOUTH EVERY MORNING 01/29/23   Nafziger, Darleene, NP  spironolactone  (ALDACTONE ) 25 MG tablet Take 1 tablet (25 mg total) by mouth daily. 07/02/23   Nafziger, Darleene, NP  venlafaxine  XR (EFFEXOR  XR) 75 MG 24 hr capsule Take with 150 mg dose 06/11/23   Nafziger, Cory, NP  venlafaxine  XR (EFFEXOR -XR) 150 MG 24 hr capsule TAKE 1 CAPSULE BY MOUTH EVERY MORNING 10/29/23   Nafziger, Darleene, NP  Vitamin D , Ergocalciferol , (DRISDOL ) 1.25 MG (50000 UNIT) CAPS capsule Take 50,000 Units by mouth every 7 (seven) days. 08/31/23   [provider]    Physical Exam    Vital Signs:  Laurie [name] does not have vital signs available for review today.  Given telephonic nature of communication, physical exam is limited. AAOx3. NAD. Normal affect.  Speech and respirations are unlabored.  Accessory Clinical Findings    None  Assessment & Plan    1.  Preoperative Cardiovascular Risk Assessment:  ACDF C4-5   Date of Surgery:  Clearance 11/25/23                                  Surgeon:  DR. VICTORY GENS Surgeon's Group or Practice Name:  [facility] Phone number:  [phone] Fax number:  [phone]      Primary Cardiologist: Lonni Cash, MD  Chart reviewed  as part of pre-operative protocol coverage. Given past medical history and time since last visit, based on ACC/AHA guidelines, Laurie [name] would be at acceptable risk for the planned procedure without further cardiovascular testing.   Her RCRI is low risk, 0.9% risk of major cardiac event.  She is able to complete greater than 4 METS of physical activity.  Patient was advised that if she develops new symptoms prior to surgery to contact our office to arrange a follow-up appointment.  She verbalized understanding.  Her aspirin  may be held for 7 days prior to her procedure if necessary. Her Plavix  may be held for 5 days prior to her procedure. Please resume aspirin  and Plavix  as soon as hemostasis is achieved.   I will route this recommendation to the requesting party via Epic fax function and remove from pre-op pool.       Time:   Today, I have spent  5 minutes with the patient with telehealth technology discussing medical history, symptoms, and management plan.  I spent 10 minutes reviewing patient's past cardiac history and cardiac medications.    Laurie CHRISTELLA Beauvais, NP  11/21/2023, 8:15 AM

## 2023-11-21 NOTE — Telephone Encounter (Signed)
 S/w the pt and she has been scheduled today preop tele appt. Med rec and consent are done.

## 2023-11-21 NOTE — Telephone Encounter (Signed)
 S/w the pt and she has been scheduled today preop tele appt. Med rec and consent are done.      Patient Consent for Virtual Visit        Laurie [name] has provided verbal consent on 11/21/2023 for a virtual visit (video or telephone).   CONSENT FOR VIRTUAL VISIT FOR:  Laurie [name]  By participating in this virtual visit I agree to the following:  I hereby voluntarily request, consent and authorize [facility] and its employed or contracted physicians, physician assistants, nurse practitioners or other licensed health care professionals (the Practitioner), to provide me with telemedicine health care services (the "Services) as deemed necessary by the treating Practitioner. I acknowledge and consent to receive the Services by the Practitioner via telemedicine. I understand that the telemedicine visit will involve communicating with the Practitioner through live audiovisual communication technology and the disclosure of certain medical information by electronic transmission. I acknowledge that I have been given the opportunity to request an in-person assessment or other available alternative prior to the telemedicine visit and am voluntarily participating in the telemedicine visit.  I understand that I have the right to withhold or withdraw my consent to the use of telemedicine in the course of my care at any time, without affecting my right to future care or treatment, and that the Practitioner or I may terminate the telemedicine visit at any time. I understand that I have the right to inspect all information obtained and/or recorded in the course of the telemedicine visit and may receive copies of available information for a reasonable fee.  I understand that some of the potential risks of receiving the Services via telemedicine include:  Delay or interruption in medical evaluation due to technological equipment failure or disruption; Information transmitted may not be  sufficient (e.g. poor resolution of images) to allow for appropriate medical decision making by the Practitioner; and/or  In rare instances, security protocols could fail, causing a breach of personal health information.  Furthermore, I acknowledge that it is my responsibility to provide information about my medical history, conditions and care that is complete and accurate to the best of my ability. I acknowledge that Practitioner's advice, recommendations, and/or decision may be based on factors not within their control, such as incomplete or inaccurate data provided by me or distortions of diagnostic images or specimens that may result from electronic transmissions. I understand that the practice of medicine is not an exact science and that Practitioner makes no warranties or guarantees regarding treatment outcomes. I acknowledge that a copy of this consent can be made available to me via my patient portal [facility] MyChart), or I can request a printed copy by calling the office of [facility].    I understand that my insurance will be billed for this visit.   I have read or had this consent read to me. I understand the contents of this consent, which adequately explains the benefits and risks of the Services being provided via telemedicine.  I have been provided ample opportunity to ask questions regarding this consent and the Services and have had my questions answered to my satisfaction. I give my informed consent for the services to be provided through the use of telemedicine in my medical care

## 2023-11-21 NOTE — Pre-Procedure Instructions (Signed)
 Surgical Instructions   Your procedure is scheduled on November 25, 2023. Report to [facility] Main Entrance A at 5:30 A.M., then check in with the Admitting office. Any questions or running late day of surgery: call [phone]  Questions prior to your surgery date: call [phone], Monday-Friday, 8am-4pm. If you experience any cold or flu symptoms such as cough, fever, chills, shortness of breath, etc. between now and your scheduled surgery, please notify us  at the above number.     Remember:  Do not eat or drink after midnight the night before your surgery    Take these medicines the morning of surgery with A SIP OF WATER: fluticasone  (FLONASE ) 50 MCG/ACT nasal spray  methadone  (DOLOPHINE )  methimazole  (TAPAZOLE )  metoprolol  succinate (TOPROL -XL)  pantoprazole  (PROTONIX )  venlafaxine  XR (EFFEXOR  XR)    May take these medicines IF NEEDED: albuterol  (VENTOLIN  HFA) inhaler - please bring inhaler with you morning of surgery meclizine  (ANTIVERT )  nitroGLYCERIN  (NITROSTAT ) - if dose taken prior to surgery, please call [phone]  PER YOUR CARDIOLOGIST'S INSTRUCTIONS, PLEASE HOLD YOUR ASPIRIN  FOR 7 DAYS WITH THE LAST DOSE BEING 11/17/2023  PER YOUR CARDIOLOGIST'S INSTRUCTIONS, PLEASE  HOLD YOUR clopidogrel  (PLAVIX ) FOR 5 DAYS WITH THE LAST DOSE BEING 11/19/2023   One week prior to surgery, STOP taking any Aleve , Naproxen , Ibuprofen , Motrin , Advil , Goody's, BC's, all herbal medications, fish oil, and non-prescription vitamins.                     Do NOT Smoke (Tobacco/Vaping) for 24 hours prior to your procedure.  If you use a CPAP at night, you may bring your mask/headgear for your overnight stay.   You will be asked to remove any contacts, glasses, piercing's, hearing aid's, dentures/partials prior to surgery. Please bring cases for these items if needed.    Patients discharged the day of surgery will not be allowed to drive home, and someone needs to stay with them for 24  hours.  SURGICAL WAITING ROOM VISITATION Patients may have no more than 2 support people in the waiting area - these visitors may rotate.   Pre-op nurse will coordinate an appropriate time for 1 ADULT support person, who may not rotate, to accompany patient in pre-op.  Children under the age of 60 must have an adult with them who is not the patient and must remain in the main waiting area with an adult.  If the patient needs to stay at the hospital during part of their recovery, the visitor guidelines for inpatient rooms apply.  Please refer to the [facility] website for the visitor guidelines for any additional information.   If you received a COVID test during your pre-op visit  it is requested that you wear a mask when out in public, stay away from anyone that may not be feeling well and notify your surgeon if you develop symptoms. If you have been in contact with anyone that has tested positive in the last 10 days please notify you surgeon.      Pre-operative 4 CHG Bathing Instructions   You can play a key role in reducing the risk of infection after surgery. Your skin needs to be as free of germs as possible. You can reduce the number of germs on your skin by washing with CHG (chlorhexidine  gluconate) soap before surgery. CHG is an antiseptic soap that kills germs and continues to kill germs even after washing.   DO NOT use if you have an allergy to chlorhexidine /CHG or  antibacterial soaps. If your skin becomes reddened or irritated, stop using the CHG and notify one of our RNs at [phone].   Please shower with the CHG soap starting 4 days before surgery using the following schedule:     Please keep in mind the following:  DO NOT shave, including legs and underarms, starting the day of your first shower.   You may shave your face at any point before/day of surgery.  Place clean sheets on your bed the day you start using CHG soap. Use a clean washcloth (not used since being  washed) for each shower. DO NOT sleep with pets once you start using the CHG.   CHG Shower Instructions:  Wash your face and private area with normal soap. If you choose to wash your hair, wash first with your normal shampoo.  After you use shampoo/soap, rinse your hair and body thoroughly to remove shampoo/soap residue.  Turn the water OFF and apply  bottle of CHG soap to a CLEAN washcloth.  Apply CHG soap ONLY FROM YOUR NECK DOWN TO YOUR TOES (washing for 3-5 minutes)  DO NOT use CHG soap on face, private areas, open wounds, or sores.  Pay special attention to the area where your surgery is being performed.  If you are having back surgery, having someone wash your back for you may be helpful. Wait 2 minutes after CHG soap is applied, then you may rinse off the CHG soap.  Pat dry with a clean towel  Put on clean clothes/pajamas   If you choose to wear lotion, please use ONLY the CHG-compatible lotions that are listed below.  Additional instructions for the day of surgery:  If you choose, you may shower the morning of surgery with an antibacterial soap.  DO NOT APPLY any lotions, deodorants, cologne, or perfumes.   Do not bring valuables to the hospital. [facility] is not responsible for any belongings/valuables. Do not wear nail polish, gel polish, artificial nails, or any other type of covering on natural nails (fingers and toes) Do not wear jewelry or makeup Put on clean/comfortable clothes.  Please brush your teeth.  Ask your nurse before applying any prescription medications to the skin.     CHG Compatible Lotions   Aveeno Moisturizing lotion  Cetaphil Moisturizing Cream  Cetaphil Moisturizing Lotion  Clairol Herbal Essence Moisturizing Lotion, Dry Skin  Clairol Herbal Essence Moisturizing Lotion, Extra Dry Skin  Clairol Herbal Essence Moisturizing Lotion, Normal Skin  Curel Age Defying Therapeutic Moisturizing Lotion with Alpha Hydroxy  Curel Extreme Care Body Lotion   Curel Soothing Hands Moisturizing Hand Lotion  Curel Therapeutic Moisturizing Cream, Fragrance-Free  Curel Therapeutic Moisturizing Lotion, Fragrance-Free  Curel Therapeutic Moisturizing Lotion, Original Formula  Eucerin Daily Replenishing Lotion  Eucerin Dry Skin Therapy Plus Alpha Hydroxy Crme  Eucerin Dry Skin Therapy Plus Alpha Hydroxy Lotion  Eucerin Original Crme  Eucerin Original Lotion  Eucerin Plus Crme Eucerin Plus Lotion  Eucerin TriLipid Replenishing Lotion  Keri Anti-Bacterial Hand Lotion  Keri Deep Conditioning Original Lotion Dry Skin Formula Softly Scented  Keri Deep Conditioning Original Lotion, Fragrance Free Sensitive Skin Formula  Keri Lotion Fast Absorbing Fragrance Free Sensitive Skin Formula  Keri Lotion Fast Absorbing Softly Scented Dry Skin Formula  Keri Original Lotion  Keri Skin Renewal Lotion Keri Silky Smooth Lotion  Keri Silky Smooth Sensitive Skin Lotion  Nivea Body Creamy Conditioning Oil  Nivea Body Extra Enriched Building services engineer Sheer Moisturizing Lotion Nivea Crme  Nivea Skin Firming Lotion  NutraDerm 30 Skin Lotion  NutraDerm Skin Lotion  NutraDerm Therapeutic Skin Cream  NutraDerm Therapeutic Skin Lotion  ProShield Protective Hand Cream  Provon moisturizing lotion  Please read over the following fact sheets that you were given.

## 2023-11-21 NOTE — Progress Notes (Signed)
 Anesthesia APP Notation: Limited evaluation during PAT visit. PAT RN had requested I evaluate patient's upper back, posterior neck wounds.  She was treated for shingles on 08/26/2023 (prednisone , valacyclovir ). ACDF was scheduled for 09/15/2023, but she reported lesions still draining as on 09/08/2023, so ACDF was delayed. In the meantime, she underwent right distal radius ORIF on 09/12/2023 by Dr. Lynwood Rias.   She presented to PAT on 11/21/2023 and reported recent completion of antibiotics (cephalexin ) for open wounds on her back. Ever since she had shingles the area has continued to bother her, so she picks and scratches at the area. She uses her fingers and tweezers.  The area around her right upper back and posterior neck have become inflamed with excoriations. Some excoriations are in a reddened linear pattern with appearance of removed outer layers of skin, presumably from scratching the area.  There are a few larger red circular lesions. No purulent drainage or excessive weeping. I did not note any blisters or crusting blisters. In my opinion, I think open wounds are secondary to repeated trauma from her picking at the area where her shingles was located rather than active shingles. She thinks the infection is improving, but I am still concerned that given the closeness to her operative site that the area may need more time to heal before undergoing ACDF.   Dr. Colon is obtaining pictures of the area and will determine next steps.  She is also scheduled for preoperative cardiology telephone evaluation on 11/21/2023. Chart will be left for follow-up and further anesthesia review.   If in the event surgery remains as scheduled, I did reach out to Warren Rebel, RN with Infection Prevention. Based on my description and the time frame from her initial diagnosis, the team did not think she would need specific isolation for herpes zoster since it was not felt to be active. If any concerns on the day of surgery,  staff could contact infection prevention for further input.   Isaiah Ruder, PA-C Surgical Short Stay/Anesthesiology [facility] Phone [phone] [facility] Phone [phone] 11/21/2023 4:41 PM

## 2023-11-21 NOTE — Progress Notes (Addendum)
 Patient c/o open red, oozing itching wound to posterior neck. It runs from top of her right shoulder and moves downward and toward the spine. Some of the wounds are actively oozing. States this has been going on since June and has been putting antibiotic ointment on it. Allison Zelenak, PA-C made aware. Entire department made aware, all staff put on mask, doors closed. This RN stayed in the room with the patient and gave her Gatorade to drink while waiting for Anesthesia to evaluate.  Isaiah in to evaluate, calls and speaks to Dr. Thayer office.  They request a picture of the areas sent to them. Consent for photo obtained and placed in the chart. Picture obtained and sent to Dr. Dr. Ihor' office.    PCP - Darleene Shape, NP Cardiologist - Dr. Lonni Cash  PPM/ICD - denies Device Orders - na Rep Notified - na  Chest x-ray - na EKG - 05/12/2023 Stress Test - 10/30/2009 ECHO - 10/08/2021 Cardiac Cath - 06/20/2022  Sleep Study - denies CPAP - na  Non-diabetic  Blood Thinner Instructions: Plavix , hold 5 days, last dose 11/19/2023 Aspirin  Instructions: hold 7 days, last dose 11/17/2023  ERAS Protcol - NPO  Anesthesia review: Cardiology clearance, CAD, HTN, heart murmur.   Patient denies shortness of breath, fever, cough and chest pain at PAT appointment   All instructions explained to the patient, with a verbal understanding of the material. Patient agrees to go over the instructions while at home for a better understanding. Patient also instructed to self quarantine after being tested for COVID-19. The opportunity to ask questions was provided.

## 2023-11-25 ENCOUNTER — Ambulatory Visit (HOSPITAL_COMMUNITY): Admission: RE | Admit: 2023-11-25 | Admitting: Neurological Surgery

## 2023-11-25 ENCOUNTER — Encounter (HOSPITAL_COMMUNITY): Admission: RE | Payer: Self-pay | Source: Home / Self Care

## 2023-11-25 ENCOUNTER — Other Ambulatory Visit: Payer: Self-pay | Admitting: Adult Health

## 2023-11-25 ENCOUNTER — Other Ambulatory Visit: Payer: Self-pay | Admitting: Nurse Practitioner

## 2023-11-25 SURGERY — ANTERIOR CERVICAL DECOMPRESSION/DISCECTOMY FUSION 1 LEVEL
Anesthesia: General

## 2023-11-26 ENCOUNTER — Ambulatory Visit: Payer: Self-pay

## 2023-11-26 NOTE — Telephone Encounter (Signed)
 FYI Only or Action Required?: Action required by provider: request for appointment and clinical question for provider.  Patient was last seen in primary care on 11/05/2023 by Merna Huxley, NP.  Called Nurse Triage reporting Dizziness and Fall.  Symptoms began several days ago.  Interventions attempted: Nothing.  Symptoms are: unchanged.  Triage Disposition: See Physician Within 24 Hours  Patient/caregiver understands and will follow disposition?: Yes  Copied from CRM #[id number]. Topic: Clinical - Red Word Triage >> Nov 26, 2023  4:36 PM Rea ORN wrote: Red Word that prompted transfer to Nurse Triage: dizziness. Pt fell backwards yesterday.   Pt calling to be seen for shingle sores that aren't going away. Pt stated they need to be cleared before she can have neck surgery. Reason for Disposition  [1] MODERATE dizziness (e.g., interferes with normal activities) AND [2] has NOT been evaluated by doctor (or NP/PA) for this  (Exception: Dizziness caused by heat exposure, sudden standing, or poor fluid intake.)  Answer Assessment - Initial Assessment Questions Advised UC/ED today. Patient requesting Call Back and  appointment tomorrow/clearance for surgery r/t shingles; was not able to have surgery today.  Nurse unable to fully triage patient regarding ongoing shingles; only reports open from scratching and draining. Patient reports have to get off phone.  Patient reports: Last fall last night, denies head injury. Reports right hand pain; had surgery 6 weeks ago, swelling;reports able fully use hand.  dizziness  1. DESCRIPTION: Describe your dizziness.     lightheaded 2. LIGHTHEADED: Do you feel lightheaded? (e.g., somewhat faint, woozy, weak upon standing)     Standing up 3. VERTIGO: Do you feel like either you or the room is spinning or tilting? (i.e., vertigo)     yes 4. SEVERITY: How bad is it?  Do you feel like you are going to faint? Can you stand and walk?     Denies  feeling faint or passing out. Reports able to stand and walk without assistance 5. ONSET:  When did the dizziness begin?     yesterday SE: What do you think is causing the dizziness? (e.g., decreased fluids or food, diarrhea, emotional distress, heat exposure, new medicine, sudden standing, vomiting; unknown)     Last took bp meds yesterday, did not take BP today, pt reports do not have bp machine right now 9. RECURRENT SYMPTOM: Have you had dizziness before? If Yes, ask: When was the last time? What happened that time?     yes 10. OTHER SYMPTOMS: Do you have any other symptoms? (e.g., fever, chest pain, vomiting, diarrhea, bleeding)       denies  Protocols used: Dizziness - Lightheadedness-A-AH

## 2023-11-26 NOTE — Telephone Encounter (Signed)
 Patient scheduled.

## 2023-11-26 NOTE — Telephone Encounter (Signed)
  The original prescription was discontinued on 08/01/2023 by Merna Huxley, NP. Renewing this prescription may not be appropriate.

## 2023-11-27 ENCOUNTER — Encounter: Payer: Self-pay | Admitting: Adult Health

## 2023-11-27 ENCOUNTER — Ambulatory Visit (INDEPENDENT_AMBULATORY_CARE_PROVIDER_SITE_OTHER): Admitting: Adult Health

## 2023-11-27 VITALS — BP 140/70 | HR 54 | Temp 97.8°F | Ht 70.0 in | Wt 157.0 lb

## 2023-11-27 DIAGNOSIS — M25531 Pain in right wrist: Secondary | ICD-10-CM

## 2023-11-27 DIAGNOSIS — R2681 Unsteadiness on feet: Secondary | ICD-10-CM | POA: Diagnosis not present

## 2023-11-27 DIAGNOSIS — T148XXA Other injury of unspecified body region, initial encounter: Secondary | ICD-10-CM

## 2023-11-27 DIAGNOSIS — M25511 Pain in right shoulder: Secondary | ICD-10-CM | POA: Diagnosis not present

## 2023-11-27 NOTE — Progress Notes (Deleted)
 Subjective:    Patient ID: Laurie [name], female    DOB: Jul 02, 1955, 68 y.o.   MRN: [id number]  HPI    Review of Systems See HPI   Past Medical History:  Diagnosis Date   Allergy    Anxiety    Arthritis    RA   Asthma    Chronic back pain    COPD (chronic obstructive pulmonary disease) (HCC)    Coronary artery disease    Depression    Esophageal stricture    GERD (gastroesophageal reflux disease)    Heart murmur    Hematemesis 09/10/2018   Hyperlipidemia    Hypertension    IBS (irritable bowel syndrome)    Interstitial cystitis    Neuromuscular disorder (HCC)    fibromyalgia   PONV (postoperative nausea and vomiting)     Social History   Socioeconomic History   Marital status: Single    Spouse name: Not on file   Number of children: Not on file   Years of education: Not on file   Highest education level: Not on file  Occupational History   Occupation: disability  Tobacco Use   Smoking status: Every Day    Current packs/day: 1.00    Types: Cigarettes   Smokeless tobacco: Never   Tobacco comments:    trying to quit   Vaping Use   Vaping status: Never Used  Substance and Sexual Activity   Alcohol use: Never    Comment: occasional   Drug use: Yes    Types: Marijuana    Comment: occas   Sexual activity: Not on file  Other Topics Concern   Not on file  Social History Narrative   Not on file   Social Drivers of Health   Financial Resource Strain: Low Risk  (06/17/2022)   Overall Financial Resource Strain (CARDIA)    Difficulty of Paying Living Expenses: Not hard at all  Food Insecurity: No Food Insecurity (07/15/2022)   Hunger Vital Sign    Worried About Running Out of Food in the Last Year: Never true    Ran Out of Food in the Last Year: Never true  Transportation Needs: No Transportation Needs (06/17/2022)   PRAPARE - Administrator, Civil Service (Medical): No    Lack of Transportation (Non-Medical): No  Physical Activity:  Sufficiently Active (06/17/2022)   Exercise Vital Sign    Days of Exercise per Week: 7 days    Minutes of Exercise per Session: 30 min  Stress: No Stress Concern Present (06/17/2022)   [employer] of Occupational Health - Occupational Stress Questionnaire    Feeling of Stress : Not at all  Social Connections: Socially Integrated (06/17/2022)   Social Connection and Isolation Panel    Frequency of Communication with Friends and Family: More than three times a week    Frequency of Social Gatherings with Friends and Family: More than three times a week    Attends Religious Services: More than 4 times per year    Active Member of [employer] or Organizations: Yes    Attends Banker Meetings: More than 4 times per year    Marital Status: Living with partner  Intimate Partner Violence: Not At Risk (06/17/2022)   Humiliation, Afraid, Rape, and Kick questionnaire    Fear of Current or Ex-Partner: No    Emotionally Abused: No    Physically Abused: No    Sexually Abused: No    Past Surgical History:  Procedure Laterality Date  ABDOMINAL HYSTERECTOMY     APPENDECTOMY     BIOPSY  09/11/2018   Procedure: BIOPSY;  Surgeon: Wilhelmenia Aloha Raddle., MD;  Location: [facility] ENDOSCOPY;  Service: Gastroenterology;;   CERVICAL FUSION     CERVICAL LAMINECTOMY     CORONARY STENT INTERVENTION N/A 10/07/2019   Procedure: CORONARY STENT INTERVENTION;  Surgeon: Verlin Lonni BIRCH, MD;  Location: [facility] INVASIVE CV LAB;  Service: Cardiovascular;  Laterality: N/A;   ESOPHAGOGASTRODUODENOSCOPY     ESOPHAGOGASTRODUODENOSCOPY (EGD) WITH PROPOFOL  N/A 09/11/2018   Procedure: ESOPHAGOGASTRODUODENOSCOPY (EGD) WITH PROPOFOL ;  Surgeon: Wilhelmenia Aloha Raddle., MD;  Location: [facility] ENDOSCOPY;  Service: Gastroenterology;  Laterality: N/A;   FINGER SURGERY     KYPHOPLASTY N/A 03/29/2021   Procedure: Lumbar Three KYPHOPLASTY;  Surgeon: Colon Shove, MD;  Location: [facility] OR;  Service: Neurosurgery;  Laterality: N/A;   LEFT  HEART CATH AND CORONARY ANGIOGRAPHY N/A 10/07/2019   Procedure: LEFT HEART CATH AND CORONARY ANGIOGRAPHY;  Surgeon: Verlin Lonni BIRCH, MD;  Location: [facility] INVASIVE CV LAB;  Service: Cardiovascular;  Laterality: N/A;   LEFT HEART CATH AND CORONARY ANGIOGRAPHY N/A 06/20/2022   Procedure: LEFT HEART CATH AND CORONARY ANGIOGRAPHY;  Surgeon: Verlin Lonni BIRCH, MD;  Location: [facility] INVASIVE CV LAB;  Service: Cardiovascular;  Laterality: N/A;   LUMBAR FUSION     SAVORY DILATION N/A 09/11/2018   Procedure: SAVORY DILATION;  Surgeon: Wilhelmenia Aloha Raddle., MD;  Location: [facility] ENDOSCOPY;  Service: Gastroenterology;  Laterality: N/A;   TONSILLECTOMY      Family History  Problem Relation Age of Onset   Dementia Mother    Heart attack Father    Coronary artery disease Father    Cancer Brother        esophageal   Esophageal cancer Brother    Coronary artery disease Paternal Aunt    Stomach cancer Paternal Aunt    Coronary artery disease Paternal Grandmother    Aneurysm Brother        aortic   Rectal cancer Neg Hx    Colon cancer Neg Hx    Thyroid  disease Neg Hx     Allergies  Allergen Reactions   Codeine Nausea And Vomiting   Lisinopril  Cough   Misc. Sulfonamide Containing Compounds    Sulfonamide Derivatives Nausea And Vomiting   Tetracyclines & Related Rash    Current Outpatient Medications on File Prior to Visit  Medication Sig Dispense Refill   albuterol  (VENTOLIN  HFA) 108 (90 Base) MCG/ACT inhaler INHALE TWO (2) PUFFS BY MOUTH EVERY 6 HOURS AS NEEDED FOR SHORTNESS OF BREATH AND FOR WHEEZING 8.5 g 11   aspirin  EC 81 MG tablet Take 1 tablet (81 mg total) by mouth daily. Swallow whole. 90 tablet 3   atorvastatin  (LIPITOR) 40 MG tablet TAKE 1 TABLET BY MOUTH EACH EVENING 30 tablet 11   cephALEXin  (KEFLEX ) 500 MG capsule Take 1 capsule (500 mg total) by mouth 2 (two) times daily. 14 capsule 0   clopidogrel  (PLAVIX ) 75 MG tablet Take 1 tablet (75 mg total) by mouth daily. 90 tablet 3    fenofibrate  (TRICOR ) 145 MG tablet TAKE 1 TABLET BY MOUTH EACH EVENING 30 tablet 11   fluticasone  (FLONASE ) 50 MCG/ACT nasal spray INSTILL TWO (2) SPRAYS IN EACH NOSTRIL DAILY 16 g 11   furosemide  (LASIX ) 20 MG tablet TAKE ONE TABLET BY MOUTH ONCE Daily 90 tablet 1   HYDROcodone -acetaminophen  (NORCO/VICODIN) 5-325 MG tablet Take 1 tablet by mouth every 6 (six) hours as needed. 5 tablet 0   meclizine  (ANTIVERT )  25 MG tablet Take 1 tablet (25 mg total) by mouth 3 (three) times daily as needed for dizziness. 30 tablet 0   methadone  (DOLOPHINE ) 10 MG/ML solution Take 115 mg by mouth daily.     methimazole  (TAPAZOLE ) 10 MG tablet Take 1 tablet (10 mg total) by mouth 2 (two) times daily. TAKE 1 TABLET BY MOUTH EACH EVENING 180 tablet 1   metoprolol  succinate (TOPROL -XL) 50 MG 24 hr tablet TAKE 1 TABLET BY MOUTH EVERY MORNING WITH OR IMMEDIATELY FOLLOWING A MEAL 30 tablet 11   naproxen  (NAPROSYN ) 500 MG tablet Take 1 tablet (500 mg total) by mouth 2 (two) times daily with a meal. 30 tablet 0   nitroGLYCERIN  (NITROSTAT ) 0.4 MG SL tablet Place 1 tablet (0.4 mg total) under the tongue every 5 (five) minutes as needed for chest pain. 25 tablet 3   olmesartan  (BENICAR ) 40 MG tablet Take 1 tablet (40 mg total) by mouth daily. 90 tablet 3   pantoprazole  (PROTONIX ) 40 MG tablet TAKE 1 TABLET BY MOUTH EVERY MORNING 30 tablet 10   spironolactone  (ALDACTONE ) 25 MG tablet Take 1 tablet (25 mg total) by mouth daily. 90 tablet 3   venlafaxine  XR (EFFEXOR  XR) 75 MG 24 hr capsule Take with 150 mg dose 90 capsule 1   venlafaxine  XR (EFFEXOR -XR) 150 MG 24 hr capsule TAKE 1 CAPSULE BY MOUTH EVERY MORNING 30 capsule 11   Vitamin D , Ergocalciferol , (DRISDOL ) 1.25 MG (50000 UNIT) CAPS capsule Take 50,000 Units by mouth every 7 (seven) days.     No current facility-administered medications on file prior to visit.    BP (!) 180/80   Pulse (!) 54   Temp 97.8 F (36.6 C) (Oral)   Ht 5' 10 (1.778 m)   Wt 157 lb (71.2 kg)    SpO2 90%   BMI 22.53 kg/m       Objective:   Physical Exam        Assessment & Plan:

## 2023-11-27 NOTE — Progress Notes (Signed)
 Subjective:    Patient ID: Laurie [name], female    DOB: [birth year], 68 y.o.   MRN: [id number]  Fall  She has a history of recurrent falls with injury. Reports that she has had 2-3 falls this week, the most recent was about two days ago when she was getting out of car and tripped over her shoes.  During the fall she put pressure on her right arm ( wrist has recently been surgically repaired). She reports pain in her right wrist and right shoulder.  Her other fall this week was when she was in the bathroom and her left leg  gave out on her when she was standing at the sink. This did not involve injury. She does report using her walker at home   Two days ago, she fell backwards, causing her neck to whip back and forth, resulting in significant pain that prevents her from laying her head back. She also experienced pain in her previously operated hand after putting pressure on it during the fall.  She was supposed to have surgical decompression surgery on her cervical spine but this had to be rescheduled until her  rash clears up. When she was last seen about 2.5 weeks ago at which time she had open wounds on her neck and left shoulder from a previous shingles rash. She was picking at the wounds and they did appear infected. She was placed on a course of Keflex . Today she reports that she has finished the abx therapy and is placing Neosporin on the wounds but she continues to pick at them      Review of Systems See HPI  Past Medical History:  Diagnosis Date   Allergy    Anxiety    Arthritis    RA   Asthma    Chronic back pain    COPD (chronic obstructive pulmonary disease) (HCC)    Coronary artery disease    Depression    Esophageal stricture    GERD (gastroesophageal reflux disease)    Heart murmur    Hematemesis 09/10/2018   Hyperlipidemia    Hypertension    IBS (irritable bowel syndrome)    Interstitial cystitis    Neuromuscular disorder (HCC)    fibromyalgia   PONV  (postoperative nausea and vomiting)     Social History   Socioeconomic History   Marital status: Single    Spouse name: Not on file   Number of children: Not on file   Years of education: Not on file   Highest education level: Not on file  Occupational History   Occupation: disability  Tobacco Use   Smoking status: Every Day    Current packs/day: 1.00    Types: Cigarettes   Smokeless tobacco: Never   Tobacco comments:    trying to quit   Vaping Use   Vaping status: Never Used  Substance and Sexual Activity   Alcohol use: Never    Comment: occasional   Drug use: Yes    Types: Marijuana    Comment: occas   Sexual activity: Not on file  Other Topics Concern   Not on file  Social History Narrative   Not on file   Social Drivers of Health   Financial Resource Strain: Low Risk  (06/17/2022)   Overall Financial Resource Strain (CARDIA)    Difficulty of Paying Living Expenses: Not hard at all  Food Insecurity: No Food Insecurity (07/15/2022)   Hunger Vital Sign    Worried About Running Out of  Food in the Last Year: Never true    Ran Out of Food in the Last Year: Never true  Transportation Needs: No Transportation Needs (06/17/2022)   PRAPARE - Administrator, Civil Service (Medical): No    Lack of Transportation (Non-Medical): No  Physical Activity: Sufficiently Active (06/17/2022)   Exercise Vital Sign    Days of Exercise per Week: 7 days    Minutes of Exercise per Session: 30 min  Stress: No Stress Concern Present (06/17/2022)   [employer] of Occupational Health - Occupational Stress Questionnaire    Feeling of Stress : Not at all  Social Connections: Socially Integrated (06/17/2022)   Social Connection and Isolation Panel    Frequency of Communication with Friends and Family: More than three times a week    Frequency of Social Gatherings with Friends and Family: More than three times a week    Attends Religious Services: More than 4 times per year    Active  Member of Clubs or Organizations: Yes    Attends Banker Meetings: More than 4 times per year    Marital Status: Living with partner  Intimate Partner Violence: Not At Risk (06/17/2022)   Humiliation, Afraid, Rape, and Kick questionnaire    Fear of Current or Ex-Partner: No    Emotionally Abused: No    Physically Abused: No    Sexually Abused: No    Past Surgical History:  Procedure Laterality Date   ABDOMINAL HYSTERECTOMY     APPENDECTOMY     BIOPSY  09/11/2018   Procedure: BIOPSY;  Surgeon: Wilhelmenia, Aloha Raddle., MD;  Location: [facility];  Service: Gastroenterology;;   CERVICAL FUSION     CERVICAL LAMINECTOMY     CORONARY STENT INTERVENTION N/A 10/07/2019   Procedure: CORONARY STENT INTERVENTION;  Surgeon: Verlin Lonni BIRCH, MD;  Location: [facility];  Service: Cardiovascular;  Laterality: N/A;   ESOPHAGOGASTRODUODENOSCOPY     ESOPHAGOGASTRODUODENOSCOPY (EGD) WITH PROPOFOL  N/A 09/11/2018   Procedure: ESOPHAGOGASTRODUODENOSCOPY (EGD) WITH PROPOFOL ;  Surgeon: Wilhelmenia Aloha Raddle., MD;  Location: [facility] ENDOSCOPY;  Service: Gastroenterology;  Laterality: N/A;   FINGER SURGERY     KYPHOPLASTY N/A 03/29/2021   Procedure: Lumbar Three KYPHOPLASTY;  Surgeon: Colon Shove, MD;  [facility]: [facility];  Service: Neurosurgery;  Laterality: N/A;   LEFT HEART CATH AND CORONARY ANGIOGRAPHY N/A 10/07/2019   Procedure: LEFT HEART CATH AND CORONARY ANGIOGRAPHY;  Surgeon: Verlin Lonni BIRCH, MD;  Location: [facility];  Service: Cardiovascular;  Laterality: N/A;   LEFT HEART CATH AND CORONARY ANGIOGRAPHY N/A 06/20/2022   Procedure: LEFT HEART CATH AND CORONARY ANGIOGRAPHY;  Surgeon: Verlin Lonni BIRCH, MD;  Location: [facility];  Service: Cardiovascular;  Laterality: N/A;   LUMBAR FUSION     SAVORY DILATION N/A 09/11/2018   Procedure: SAVORY DILATION;  Surgeon: Wilhelmenia Aloha Raddle., MD;  Location: [facility] ENDOSCOPY;  Service: Gastroenterology;  Laterality: N/A;    TONSILLECTOMY      Family History  Problem Relation Age of Onset   Dementia Mother    Heart attack Father    Coronary artery disease Father    Cancer Brother        esophageal   Esophageal cancer Brother    Coronary artery disease Paternal Aunt    Stomach cancer Paternal Aunt    Coronary artery disease Paternal Grandmother    Aneurysm Brother        aortic   Rectal cancer Neg Hx    Colon  cancer Neg Hx    Thyroid  disease Neg Hx     Allergies  Allergen Reactions   Codeine Nausea And Vomiting   Lisinopril  Cough   Misc. Sulfonamide Containing Compounds    Sulfonamide Derivatives Nausea And Vomiting   Tetracyclines & Related Rash    Current Outpatient Medications on File Prior to Visit  Medication Sig Dispense Refill   albuterol  (VENTOLIN  HFA) 108 (90 Base) MCG/ACT inhaler INHALE TWO (2) PUFFS BY MOUTH EVERY 6 HOURS AS NEEDED FOR SHORTNESS OF BREATH AND FOR WHEEZING 8.5 g 11   aspirin  EC 81 MG tablet Take 1 tablet (81 mg total) by mouth daily. Swallow whole. 90 tablet 3   atorvastatin  (LIPITOR) 40 MG tablet TAKE 1 TABLET BY MOUTH EACH EVENING 30 tablet 11   cephALEXin  (KEFLEX ) 500 MG capsule Take 1 capsule (500 mg total) by mouth 2 (two) times daily. 14 capsule 0   clopidogrel  (PLAVIX ) 75 MG tablet Take 1 tablet (75 mg total) by mouth daily. 90 tablet 3   fenofibrate  (TRICOR ) 145 MG tablet TAKE 1 TABLET BY MOUTH EACH EVENING 30 tablet 11   fluticasone  (FLONASE ) 50 MCG/ACT nasal spray INSTILL TWO (2) SPRAYS IN EACH NOSTRIL DAILY 16 g 11   furosemide  (LASIX ) 20 MG tablet TAKE ONE TABLET BY MOUTH ONCE Daily 90 tablet 1   HYDROcodone -acetaminophen  (NORCO/VICODIN) 5-325 MG tablet Take 1 tablet by mouth every 6 (six) hours as needed. 5 tablet 0   meclizine  (ANTIVERT ) 25 MG tablet Take 1 tablet (25 mg total) by mouth 3 (three) times daily as needed for dizziness. 30 tablet 0   methadone  (DOLOPHINE ) 10 MG/ML solution Take 115 mg by mouth daily.     methimazole  (TAPAZOLE ) 10 MG tablet  Take 1 tablet (10 mg total) by mouth 2 (two) times daily. TAKE 1 TABLET BY MOUTH EACH EVENING 180 tablet 1   metoprolol  succinate (TOPROL -XL) 50 MG 24 hr tablet TAKE 1 TABLET BY MOUTH EVERY MORNING WITH OR IMMEDIATELY FOLLOWING A MEAL 30 tablet 11   naproxen  (NAPROSYN ) 500 MG tablet Take 1 tablet (500 mg total) by mouth 2 (two) times daily with a meal. 30 tablet 0   nitroGLYCERIN  (NITROSTAT ) 0.4 MG SL tablet Place 1 tablet (0.4 mg total) under the tongue every 5 (five) minutes as needed for chest pain. 25 tablet 3   olmesartan  (BENICAR ) 40 MG tablet Take 1 tablet (40 mg total) by mouth daily. 90 tablet 3   pantoprazole  (PROTONIX ) 40 MG tablet TAKE 1 TABLET BY MOUTH EVERY MORNING 30 tablet 10   spironolactone  (ALDACTONE ) 25 MG tablet Take 1 tablet (25 mg total) by mouth daily. 90 tablet 3   venlafaxine  XR (EFFEXOR  XR) 75 MG 24 hr capsule Take with 150 mg dose 90 capsule 1   venlafaxine  XR (EFFEXOR -XR) 150 MG 24 hr capsule TAKE 1 CAPSULE BY MOUTH EVERY MORNING 30 capsule 11   Vitamin D , Ergocalciferol , (DRISDOL ) 1.25 MG (50000 UNIT) CAPS capsule Take 50,000 Units by mouth every 7 (seven) days.     No current facility-administered medications on file prior to visit.    BP (!) 140/70   Pulse (!) 54   Temp 97.8 F (36.6 C) (Oral)   Ht 5' 10 (1.778 m)   Wt 157 lb (71.2 kg)   SpO2 90%   BMI 22.53 kg/m       Objective:   Physical Exam Vitals and nursing note reviewed.  Constitutional:      Appearance: Normal appearance.  Musculoskeletal:  General: Tenderness present. No deformity.  Skin:    General: Skin is warm and dry.     Comments: She does have open wounds on her left shoulder and neck but these are much improved since the last time she was seen. Some of the wounds have healed. No signs of infection   Neurological:     General: No focal deficit present.     Mental Status: She is alert and oriented to person, place, and time.     Gait: Gait abnormal (not using walker in the  office).  Psychiatric:        Mood and Affect: Mood normal.        Behavior: Behavior normal.        Thought Content: Thought content normal.        Judgment: Judgment normal.        Assessment & Plan:  1. Gait instability (Primary) - encouraged to use walker at all times.She is not using it in the office  2. Right wrist pain  - DG Wrist Complete Right; Future  3. Acute pain of right shoulder  - DG Shoulder Right; Future  4. Open wound - Healing well.  - Advised against picking at her scabs   Darleene Shape, NP

## 2023-11-28 ENCOUNTER — Other Ambulatory Visit

## 2023-11-28 ENCOUNTER — Ambulatory Visit (INDEPENDENT_AMBULATORY_CARE_PROVIDER_SITE_OTHER)

## 2023-11-28 DIAGNOSIS — S52351K Displaced comminuted fracture of shaft of radius, right arm, subsequent encounter for closed fracture with nonunion: Secondary | ICD-10-CM | POA: Diagnosis not present

## 2023-11-28 DIAGNOSIS — M25511 Pain in right shoulder: Secondary | ICD-10-CM | POA: Diagnosis not present

## 2023-11-28 DIAGNOSIS — S52611K Displaced fracture of right ulna styloid process, subsequent encounter for closed fracture with nonunion: Secondary | ICD-10-CM | POA: Diagnosis not present

## 2023-11-28 DIAGNOSIS — M25531 Pain in right wrist: Secondary | ICD-10-CM | POA: Diagnosis not present

## 2023-12-03 ENCOUNTER — Ambulatory Visit: Payer: Self-pay | Admitting: Adult Health

## 2023-12-03 ENCOUNTER — Other Ambulatory Visit: Payer: Self-pay | Admitting: Adult Health

## 2023-12-09 ENCOUNTER — Other Ambulatory Visit: Payer: Self-pay | Admitting: Adult Health

## 2023-12-09 DIAGNOSIS — F321 Major depressive disorder, single episode, moderate: Secondary | ICD-10-CM

## 2023-12-10 ENCOUNTER — Ambulatory Visit: Admitting: "Endocrinology

## 2023-12-24 ENCOUNTER — Other Ambulatory Visit: Payer: Self-pay | Admitting: Adult Health

## 2024-01-02 ENCOUNTER — Encounter: Payer: Self-pay | Admitting: Adult Health

## 2024-01-02 ENCOUNTER — Ambulatory Visit (INDEPENDENT_AMBULATORY_CARE_PROVIDER_SITE_OTHER): Admitting: Adult Health

## 2024-01-02 ENCOUNTER — Ambulatory Visit: Payer: Self-pay | Admitting: Adult Health

## 2024-01-02 ENCOUNTER — Ambulatory Visit (INDEPENDENT_AMBULATORY_CARE_PROVIDER_SITE_OTHER)

## 2024-01-02 VITALS — BP 120/60 | HR 64 | Temp 98.3°F | Ht 70.0 in | Wt 151.0 lb

## 2024-01-02 DIAGNOSIS — M542 Cervicalgia: Secondary | ICD-10-CM

## 2024-01-02 DIAGNOSIS — T148XXA Other injury of unspecified body region, initial encounter: Secondary | ICD-10-CM

## 2024-01-02 DIAGNOSIS — M25531 Pain in right wrist: Secondary | ICD-10-CM | POA: Diagnosis not present

## 2024-01-02 DIAGNOSIS — R2681 Unsteadiness on feet: Secondary | ICD-10-CM

## 2024-01-02 NOTE — Progress Notes (Signed)
 Subjective:    Patient ID: Laurie [name], female    DOB: 01-Feb-1956, 68 y.o.   MRN: [id number]  HPI 68 year old female who  has a past medical history of Allergy, Anxiety, Arthritis, Asthma, Chronic back pain, COPD (chronic obstructive pulmonary disease) (HCC), Coronary artery disease, Depression, Esophageal stricture, GERD (gastroesophageal reflux disease), Heart murmur, Hematemesis (09/10/2018), Hyperlipidemia, Hypertension, IBS (irritable bowel syndrome), Interstitial cystitis, Neuromuscular disorder (HCC), and PONV (postoperative nausea and vomiting).  She presents to the office today for multiple issues.   She was last seen about a month ago at which time she was supposed to have surgical decompression surgery on her cervical spine but this had to be postponed until her rash on the back of her neck cleared up.  When she was seen a month ago she had several open wounds on her neck and left shoulder from from previous shingles rash, 2.5 weeks earlier she was prescribed a course of Keflex  for these wounds related appearing affected from likely picking at the scabs.  When she was seen a month ago the wounds appear to be healing well but she was still picking and placing various topical agents on the wounds.  Today she reports that the wounds still present, she continues to pick at them and applies various topical agents such as Neosporin, rubbing alcohol and likely hydrogen peroxide.  Furthermore she reports having 8 additional falls over the last month, her last fall was a week ago.  She has a walker at home and endorses using it sometimes but not all the time.  As in the past she does not have her walker with her today.  She endorses weakness in her lower extremities causing her to fall forward she gets tripped up.  She has picked up her throw rugs around the house.  She reports that in one of the falls she did injure her right wrist which has been surgically repaired in the past and also  causes worsening neck pain.    See HPI   Past Medical History:  Diagnosis Date   Allergy    Anxiety    Arthritis    RA   Asthma    Chronic back pain    COPD (chronic obstructive pulmonary disease) (HCC)    Coronary artery disease    Depression    Esophageal stricture    GERD (gastroesophageal reflux disease)    Heart murmur    Hematemesis 09/10/2018   Hyperlipidemia    Hypertension    IBS (irritable bowel syndrome)    Interstitial cystitis    Neuromuscular disorder (HCC)    fibromyalgia   PONV (postoperative nausea and vomiting)     Social History   Socioeconomic History   Marital status: Single    Spouse name: Not on file   Number of children: Not on file   Years of education: Not on file   Highest education level: Not on file  Occupational History   Occupation: disability  Tobacco Use   Smoking status: Every Day    Current packs/day: 1.00    Types: Cigarettes   Smokeless tobacco: Never   Tobacco comments:    trying to quit   Vaping Use   Vaping status: Never Used  Substance and Sexual Activity   Alcohol use: Never    Comment: occasional   Drug use: Yes    Types: Marijuana    Comment: occas   Sexual activity: Not on file  Other Topics Concern   Not  on file  Social History Narrative   Not on file   Social Drivers of Health   Financial Resource Strain: Low Risk  (06/17/2022)   Overall Financial Resource Strain (CARDIA)    Difficulty of Paying Living Expenses: Not hard at all  Food Insecurity: No Food Insecurity (07/15/2022)   Hunger Vital Sign    Worried About Running Out of Food in the Last Year: Never true    Ran Out of Food in the Last Year: Never true  Transportation Needs: No Transportation Needs (06/17/2022)   PRAPARE - Administrator, Civil Service (Medical): No    Lack of Transportation (Non-Medical): No  Physical Activity: Sufficiently Active (06/17/2022)   Exercise Vital Sign    Days of Exercise per Week: 7 days    Minutes of  Exercise per Session: 30 min  Stress: No Stress Concern Present (06/17/2022)   [employer] of Occupational Health - Occupational Stress Questionnaire    Feeling of Stress : Not at all  Social Connections: Socially Integrated (06/17/2022)   Social Connection and Isolation Panel    Frequency of Communication with Friends and Family: More than three times a week    Frequency of Social Gatherings with Friends and Family: More than three times a week    Attends Religious Services: More than 4 times per year    Active Member of Clubs or Organizations: Yes    Attends Banker Meetings: More than 4 times per year    Marital Status: Living with partner  Intimate Partner Violence: Not At Risk (06/17/2022)   Humiliation, Afraid, Rape, and Kick questionnaire    Fear of Current or Ex-Partner: No    Emotionally Abused: No    Physically Abused: No    Sexually Abused: No    Past Surgical History:  Procedure Laterality Date   ABDOMINAL HYSTERECTOMY     APPENDECTOMY     BIOPSY  09/11/2018   Procedure: BIOPSY;  Surgeon: Wilhelmenia, Aloha Raddle., MD;  Location: [facility];  Service: Gastroenterology;;   CERVICAL FUSION     CERVICAL LAMINECTOMY     CORONARY STENT INTERVENTION N/A 10/07/2019   Procedure: CORONARY STENT INTERVENTION;  Surgeon: Verlin Lonni BIRCH, MD;  Location: [facility];  Service: Cardiovascular;  Laterality: N/A;   ESOPHAGOGASTRODUODENOSCOPY     ESOPHAGOGASTRODUODENOSCOPY (EGD) WITH PROPOFOL  N/A 09/11/2018   Procedure: ESOPHAGOGASTRODUODENOSCOPY (EGD) WITH PROPOFOL ;  Surgeon: Wilhelmenia Aloha Raddle., MD;  Location: [facility] ENDOSCOPY;  Service: Gastroenterology;  Laterality: N/A;   FINGER SURGERY     KYPHOPLASTY N/A 03/29/2021   Procedure: Lumbar Three KYPHOPLASTY;  Surgeon: Colon Shove, MD;  Location: [facility] OR;  Service: Neurosurgery;  Laterality: N/A;   LEFT HEART CATH AND CORONARY ANGIOGRAPHY N/A 10/07/2019   Procedure: LEFT HEART CATH AND CORONARY ANGIOGRAPHY;   Surgeon: Verlin Lonni BIRCH, MD;  Location: [facility];  Service: Cardiovascular;  Laterality: N/A;   LEFT HEART CATH AND CORONARY ANGIOGRAPHY N/A 06/20/2022   Procedure: LEFT HEART CATH AND CORONARY ANGIOGRAPHY;  Surgeon: Verlin Lonni BIRCH, MD;  Location: [facility];  Service: Cardiovascular;  Laterality: N/A;   LUMBAR FUSION     SAVORY DILATION N/A 09/11/2018   Procedure: SAVORY DILATION;  Surgeon: Wilhelmenia Aloha Raddle., MD;  Location: [facility] ENDOSCOPY;  Service: Gastroenterology;  Laterality: N/A;   TONSILLECTOMY      Family History  Problem Relation Age of Onset   Dementia Mother    Heart attack Father    Coronary artery disease  Father    Cancer Brother        esophageal   Esophageal cancer Brother    Coronary artery disease Paternal Aunt    Stomach cancer Paternal Aunt    Coronary artery disease Paternal Grandmother    Aneurysm Brother        aortic   Rectal cancer Neg Hx    Colon cancer Neg Hx    Thyroid  disease Neg Hx     Allergies  Allergen Reactions   Codeine Nausea And Vomiting   Lisinopril  Cough   Misc. Sulfonamide Containing Compounds    Sulfonamide Derivatives Nausea And Vomiting   Tetracyclines & Related Rash    Current Outpatient Medications on File Prior to Visit  Medication Sig Dispense Refill   albuterol  (VENTOLIN  HFA) 108 (90 Base) MCG/ACT inhaler INHALE TWO (2) PUFFS BY MOUTH EVERY 6 HOURS AS NEEDED FOR SHORTNESS OF BREATH AND FOR WHEEZING 8.5 g 11   aspirin  EC 81 MG tablet Take 1 tablet (81 mg total) by mouth daily. Swallow whole. 90 tablet 3   atorvastatin  (LIPITOR) 40 MG tablet TAKE 1 TABLET BY MOUTH EACH EVENING 30 tablet 11   cephALEXin  (KEFLEX ) 500 MG capsule Take 1 capsule (500 mg total) by mouth 2 (two) times daily. 14 capsule 0   clopidogrel  (PLAVIX ) 75 MG tablet TAKE 1 TABLET (75 MG TOTAL) BY MOUTH DAILY. 90 tablet 0   fenofibrate  (TRICOR ) 145 MG tablet TAKE 1 TABLET BY MOUTH EACH EVENING 30 tablet 11   fluticasone   (FLONASE ) 50 MCG/ACT nasal spray INSTILL TWO (2) SPRAYS IN EACH NOSTRIL DAILY 16 g 11   furosemide  (LASIX ) 20 MG tablet TAKE ONE TABLET BY MOUTH ONCE Daily 90 tablet 1   HYDROcodone -acetaminophen  (NORCO/VICODIN) 5-325 MG tablet Take 1 tablet by mouth every 6 (six) hours as needed. 5 tablet 0   meclizine  (ANTIVERT ) 25 MG tablet Take 1 tablet (25 mg total) by mouth 3 (three) times daily as needed for dizziness. 30 tablet 0   methadone  (DOLOPHINE ) 10 MG/ML solution Take 115 mg by mouth daily.     methimazole  (TAPAZOLE ) 10 MG tablet Take 1 tablet (10 mg total) by mouth 2 (two) times daily. TAKE 1 TABLET BY MOUTH EACH EVENING 180 tablet 1   metoprolol  succinate (TOPROL -XL) 50 MG 24 hr tablet TAKE 1 TABLET BY MOUTH EVERY MORNING WITH OR IMMEDIATELY FOLLOWING A MEAL 30 tablet 11   naproxen  (NAPROSYN ) 500 MG tablet Take 1 tablet (500 mg total) by mouth 2 (two) times daily with a meal. 30 tablet 0   nitroGLYCERIN  (NITROSTAT ) 0.4 MG SL tablet Place 1 tablet (0.4 mg total) under the tongue every 5 (five) minutes as needed for chest pain. 25 tablet 3   olmesartan  (BENICAR ) 40 MG tablet Take 1 tablet (40 mg total) by mouth daily. 90 tablet 3   pantoprazole  (PROTONIX ) 40 MG tablet TAKE 1 TABLET BY MOUTH EVERY MORNING 30 tablet 11   spironolactone  (ALDACTONE ) 25 MG tablet Take 1 tablet (25 mg total) by mouth daily. 90 tablet 3   venlafaxine  XR (EFFEXOR -XR) 150 MG 24 hr capsule TAKE 1 CAPSULE BY MOUTH EVERY MORNING 30 capsule 11   venlafaxine  XR (EFFEXOR -XR) 75 MG 24 hr capsule TAKE 1 CAPSULE BY MOUTH DAILY WITH 150MG  DOSE 90 capsule 1   Vitamin D , Ergocalciferol , (DRISDOL ) 1.25 MG (50000 UNIT) CAPS capsule Take 50,000 Units by mouth every 7 (seven) days.     No current facility-administered medications on file prior to visit.    BP 120/60  Pulse 64   Temp 98.3 F (36.8 C) (Oral)   Ht 5' 10 (1.778 m)   Wt 151 lb (68.5 kg)   SpO2 95%   BMI 21.67 kg/m       Objective:   Physical Exam Vitals and  nursing note reviewed.  Constitutional:      Appearance: She is well-developed.  Cardiovascular:     Rate and Rhythm: Regular rhythm.     Pulses: Normal pulses.     Heart sounds: Normal heart sounds.  Pulmonary:     Effort: Pulmonary effort is normal.     Breath sounds: Normal breath sounds.  Musculoskeletal:        General: Swelling and tenderness present.     Right wrist: Swelling, tenderness and bony tenderness present. No deformity, snuff box tenderness or crepitus. Normal range of motion.     Cervical back: Deformity and bony tenderness present. Pain with movement present. Decreased range of motion.  Skin:    General: Skin is warm and dry.     Comments: Several open wounds on the right neck/shoulder. These wounds do not appear infected   Neurological:     General: No focal deficit present.     Mental Status: She is alert and oriented to person, place, and time.     Gait: Gait abnormal (slow unsteady gait).  Psychiatric:        Mood and Affect: Mood normal.        Behavior: Behavior normal.        Thought Content: Thought content normal.        Judgment: Judgment normal.        Assessment & Plan:  1. Right wrist pain (Primary) - Does not seem to be fractured but she is tender throughout the wrist. Will do xray today  - DG Wrist Complete Right; Future  2. Cervical spine pain  - DG Cervical Spine Complete; Future  3. Open wound - Advised to stop picking as this and smoking is causing a delay in total wound healing  - Can apply neosporin and bandaid  4. Gait instability - We had a long discussion about falls. Thankfully she has not restarted her Plavix . She needs to use her walker at all times. I did prove her with a prescription for a rolling walker with seat as well as a referral to PT  - Ambulatory referral to Physical Therapy  Darleene Shape, NP  I personally spent a total of 45 minutes in the care of the patient today including preparing to see the patient,  getting/reviewing separately obtained history, performing a medically appropriate exam/evaluation, counseling and educating, placing orders, and documenting clinical information in the EHR.

## 2024-01-06 ENCOUNTER — Ambulatory Visit: Admitting: Family Medicine

## 2024-01-06 DIAGNOSIS — E2839 Other primary ovarian failure: Secondary | ICD-10-CM

## 2024-01-06 NOTE — Progress Notes (Signed)
 ---------------------------------------------------------------------------------------------------------------------------------------------------------------------------------------------------------------------  Because this visit was a virtual/telehealth visit, some criteria may be missing or patient reported. Any vitals not documented were not able to be obtained and vitals that have been documented are patient reported.    MEDICARE ANNUAL PREVENTIVE VISIT WITH PROVIDER: (Welcome to Medicare, initial annual wellness or annual wellness exam)  Virtual Visit via Phone Note  I connected with Laurie  D [name] on 01/06/24 by phone and verified that I am speaking with the correct person using two identifiers. Declines video as is not working.   Location patient: home Location provider:work or home office Persons participating in the virtual visit: patient, provider  Concerns and/or follow up today: detailed intake and risks/health assessment completed in flowsheets and below - please see for details. Patient not at home as she forgot she had appointment. She doesn't have her medications to review - she agrees to make appt to this.    See HM section in Epic for other details of completed HM.    ROS: negative for report of fevers, unintentional weight loss, vision changes, vision loss, hearing loss or change, chest pain, sob, hemoptysis, melena, hematochezia, hematuria or bleeding or bruising  Patient-completed extensive health risk assessment - reviewed and discussed with the patient: See Health Risk Assessment completed with patient prior to the visit either above or in recent phone note. This was reviewed in detailed with the patient today and appropriate recommendations, orders and referrals were placed as needed per Summary below and patient instructions.   Review of Medical History: -PMH, PSH, Family History and current specialty and care providers reviewed and updated and listed below    Patient Care Team: Merna Huxley, NP as PCP - General (Family Medicine) Verlin Lonni BIRCH, MD as PCP - Cardiology (Cardiology) Lionell Jon DEL, [facility] (Pharmacist)   Past Medical History:  Diagnosis Date   Allergy    Anxiety    Arthritis    RA   Asthma    Chronic back pain    COPD (chronic obstructive pulmonary disease) (HCC)    Coronary artery disease    Depression    Esophageal stricture    GERD (gastroesophageal reflux disease)    Heart murmur    Hematemesis 09/10/2018   Hyperlipidemia    Hypertension    IBS (irritable bowel syndrome)    Interstitial cystitis    Neuromuscular disorder (HCC)    fibromyalgia   PONV (postoperative nausea and vomiting)     Past Surgical History:  Procedure Laterality Date   ABDOMINAL HYSTERECTOMY     APPENDECTOMY     BIOPSY  09/11/2018   Procedure: BIOPSY;  Surgeon: Wilhelmenia Aloha Raddle., MD;  Location: [facility] ENDOSCOPY;  Service: Gastroenterology;;   CERVICAL FUSION     CERVICAL LAMINECTOMY     CORONARY STENT INTERVENTION N/A 10/07/2019   Procedure: CORONARY STENT INTERVENTION;  Surgeon: Verlin Lonni BIRCH, MD;  Location: [facility] INVASIVE CV LAB;  Service: Cardiovascular;  Laterality: N/A;   ESOPHAGOGASTRODUODENOSCOPY     ESOPHAGOGASTRODUODENOSCOPY (EGD) WITH PROPOFOL  N/A 09/11/2018   Procedure: ESOPHAGOGASTRODUODENOSCOPY (EGD) WITH PROPOFOL ;  Surgeon: Wilhelmenia Aloha Raddle., MD;  Location: [facility] ENDOSCOPY;  Service: Gastroenterology;  Laterality: N/A;   FINGER SURGERY     KYPHOPLASTY N/A 03/29/2021   Procedure: Lumbar Three KYPHOPLASTY;  Surgeon: Colon Shove, MD;  Location: [facility] OR;  Service: Neurosurgery;  Laterality: N/A;   LEFT HEART CATH AND CORONARY ANGIOGRAPHY N/A 10/07/2019   Procedure: LEFT HEART CATH AND CORONARY ANGIOGRAPHY;  Surgeon: Verlin Lonni BIRCH, MD;  Location: [facility] INVASIVE  CV LAB;  Service: Cardiovascular;  Laterality: N/A;   LEFT HEART CATH AND CORONARY ANGIOGRAPHY N/A 06/20/2022   Procedure: LEFT HEART CATH AND  CORONARY ANGIOGRAPHY;  Surgeon: Verlin Lonni BIRCH, MD;  Location: [facility] INVASIVE CV LAB;  Service: Cardiovascular;  Laterality: N/A;   LUMBAR FUSION     SAVORY DILATION N/A 09/11/2018   Procedure: SAVORY DILATION;  Surgeon: Wilhelmenia Aloha Raddle., MD;  Location: [facility] ENDOSCOPY;  Service: Gastroenterology;  Laterality: N/A;   TONSILLECTOMY      Social History   Socioeconomic History   Marital status: Single    Spouse name: Not on file   Number of children: Not on file   Years of education: Not on file   Highest education level: Not on file  Occupational History   Occupation: disability  Tobacco Use   Smoking status: Every Day    Current packs/day: 1.00    Types: Cigarettes   Smokeless tobacco: Never   Tobacco comments:    trying to quit   Vaping Use   Vaping status: Never Used  Substance and Sexual Activity   Alcohol use: Never    Comment: occasional   Drug use: Yes    Types: Marijuana    Comment: occas   Sexual activity: Not on file  Other Topics Concern   Not on file  Social History Narrative   Not on file   Social Drivers of Health   Financial Resource Strain: Low Risk  (06/17/2022)   Overall Financial Resource Strain (CARDIA)    Difficulty of Paying Living Expenses: Not hard at all  Food Insecurity: No Food Insecurity (07/15/2022)   Hunger Vital Sign    Worried About Running Out of Food in the Last Year: Never true    Ran Out of Food in the Last Year: Never true  Transportation Needs: No Transportation Needs (06/17/2022)   PRAPARE - Administrator, Civil Service (Medical): No    Lack of Transportation (Non-Medical): No  Physical Activity: Inactive (01/06/2024)   Exercise Vital Sign    Days of Exercise per Week: 0 days    Minutes of Exercise per Session: 0 min  Stress: No Stress Concern Present (01/06/2024)   [employer] of Occupational Health - Occupational Stress Questionnaire    Feeling of Stress: Only a little  Social Connections: Moderately  Isolated (01/06/2024)   Social Connection and Isolation Panel    Frequency of Communication with Friends and Family: More than three times a week    Frequency of Social Gatherings with Friends and Family: Three times a week    Attends Religious Services: Never    Active Member of Clubs or Organizations: No    Attends Banker Meetings: Never    Marital Status: Living with partner  Intimate Partner Violence: Not At Risk (06/17/2022)   Humiliation, Afraid, Rape, and Kick questionnaire    Fear of Current or Ex-Partner: No    Emotionally Abused: No    Physically Abused: No    Sexually Abused: No    Family History  Problem Relation Age of Onset   Dementia Mother    Heart attack Father    Coronary artery disease Father    Cancer Brother        esophageal   Esophageal cancer Brother    Coronary artery disease Paternal Aunt    Stomach cancer Paternal Aunt    Coronary artery disease Paternal Grandmother    Aneurysm Brother        aortic  Rectal cancer Neg Hx    Colon cancer Neg Hx    Thyroid  disease Neg Hx     Current Outpatient Medications on File Prior to Visit  Medication Sig Dispense Refill   albuterol  (VENTOLIN  HFA) 108 (90 Base) MCG/ACT inhaler INHALE TWO (2) PUFFS BY MOUTH EVERY 6 HOURS AS NEEDED FOR SHORTNESS OF BREATH AND FOR WHEEZING 8.5 g 11   aspirin  EC 81 MG tablet Take 1 tablet (81 mg total) by mouth daily. Swallow whole. 90 tablet 3   atorvastatin  (LIPITOR) 40 MG tablet TAKE 1 TABLET BY MOUTH EACH EVENING 30 tablet 11   cephALEXin  (KEFLEX ) 500 MG capsule Take 1 capsule (500 mg total) by mouth 2 (two) times daily. 14 capsule 0   clopidogrel  (PLAVIX ) 75 MG tablet TAKE 1 TABLET (75 MG TOTAL) BY MOUTH DAILY. 90 tablet 0   fenofibrate  (TRICOR ) 145 MG tablet TAKE 1 TABLET BY MOUTH EACH EVENING 30 tablet 11   fluticasone  (FLONASE ) 50 MCG/ACT nasal spray INSTILL TWO (2) SPRAYS IN EACH NOSTRIL DAILY 16 g 11   furosemide  (LASIX ) 20 MG tablet TAKE ONE TABLET BY MOUTH  ONCE Daily 90 tablet 1   HYDROcodone -acetaminophen  (NORCO/VICODIN) 5-325 MG tablet Take 1 tablet by mouth every 6 (six) hours as needed. 5 tablet 0   meclizine  (ANTIVERT ) 25 MG tablet Take 1 tablet (25 mg total) by mouth 3 (three) times daily as needed for dizziness. 30 tablet 0   methadone  (DOLOPHINE ) 10 MG/ML solution Take 115 mg by mouth daily.     methimazole  (TAPAZOLE ) 10 MG tablet Take 1 tablet (10 mg total) by mouth 2 (two) times daily. TAKE 1 TABLET BY MOUTH EACH EVENING 180 tablet 1   metoprolol  succinate (TOPROL -XL) 50 MG 24 hr tablet TAKE 1 TABLET BY MOUTH EVERY MORNING WITH OR IMMEDIATELY FOLLOWING A MEAL 30 tablet 11   naproxen  (NAPROSYN ) 500 MG tablet Take 1 tablet (500 mg total) by mouth 2 (two) times daily with a meal. 30 tablet 0   nitroGLYCERIN  (NITROSTAT ) 0.4 MG SL tablet Place 1 tablet (0.4 mg total) under the tongue every 5 (five) minutes as needed for chest pain. 25 tablet 3   olmesartan  (BENICAR ) 40 MG tablet Take 1 tablet (40 mg total) by mouth daily. 90 tablet 3   pantoprazole  (PROTONIX ) 40 MG tablet TAKE 1 TABLET BY MOUTH EVERY MORNING 30 tablet 11   spironolactone  (ALDACTONE ) 25 MG tablet Take 1 tablet (25 mg total) by mouth daily. 90 tablet 3   venlafaxine  XR (EFFEXOR -XR) 150 MG 24 hr capsule TAKE 1 CAPSULE BY MOUTH EVERY MORNING 30 capsule 11   venlafaxine  XR (EFFEXOR -XR) 75 MG 24 hr capsule TAKE 1 CAPSULE BY MOUTH DAILY WITH 150MG  DOSE 90 capsule 1   Vitamin D , Ergocalciferol , (DRISDOL ) 1.25 MG (50000 UNIT) CAPS capsule Take 50,000 Units by mouth every 7 (seven) days.     No current facility-administered medications on file prior to visit.    Allergies  Allergen Reactions   Codeine Nausea And Vomiting   Lisinopril  Cough   Misc. Sulfonamide Containing Compounds    Sulfonamide Derivatives Nausea And Vomiting   Tetracyclines & Related Rash       Physical Exam Vitals requested from patient and listed below if patient had equipment and was able to obtain at home  for this virtual visit: There were no vitals filed for this visit. Estimated body mass index is 21.67 kg/m as calculated from the following:   Height as of 01/02/24: 5' 10 (1.778 m).  Weight as of 01/02/24: 151 lb (68.5 kg).  EKG (optional): deferred due to virtual visit  GENERAL: alert, oriented, no acute distress detected, full vision exam deferred due to pandemic and/or virtual encounter  PSYCH/NEURO: pleasant and cooperative, no obvious depression or anxiety, speech and thought processing grossly intact, Cognitive function grossly intact  Flowsheet Row Clinical Support from 01/06/2024 in [facility] [place]  PHQ-9 Total Score 3        01/06/2024    3:25 PM 11/27/2023   10:43 AM 06/11/2023    2:00 PM 11/19/2022   10:47 AM 06/17/2022    1:56 PM  Depression screen PHQ 2/9  Decreased Interest 0 1 0 0 0  Down, Depressed, Hopeless 1 1 0 0 0  PHQ - 2 Score 1 2 0 0 0  Altered sleeping 1 0 0 1 0  Tired, decreased energy 1 0 0 0 0  Change in appetite 0 0 0 0 0  Feeling bad or failure about yourself  0 0 0 0 0  Trouble concentrating 0 0 0 0 0  Moving slowly or fidgety/restless 0 0 0 0 0  Suicidal thoughts 0 0 0 0 0  PHQ-9 Score 3 2  0  1  0   Difficult doing work/chores Not difficult at all Not difficult at all Not difficult at all Not difficult at all Not difficult at all     Data saved with a previous flowsheet row definition       02/13/2022   12:59 PM 06/17/2022    1:58 PM 11/19/2022   10:47 AM 01/02/2024    9:36 AM 01/06/2024    3:08 PM  Fall Risk  Falls in the past year? 1 1 1 1 1   Was there an injury with Fall? 0 0 0 1 1  Fall Risk Category Calculator 2 1 1 3    Fall Risk Category (Retired) Moderate       (RETIRED) Patient Fall Risk Level Moderate fall risk       Patient at Risk for Falls Due to History of fall(s) No Fall Risks No Fall Risks History of fall(s);Impaired balance/gait;Impaired mobility;Impaired vision History of fall(s)  Fall risk  Follow up Falls evaluation completed  Falls prevention discussed Falls evaluation completed Falls evaluation completed Education provided;Falls prevention discussed     Data saved with a previous flowsheet row definition     SUMMARY AND PLAN:  Estrogen deficiency - Plan: DG Bone Density   Discussed applicable health maintenance/preventive health measures and advised and referred or ordered per patient preferences: -she says she will call to schedule colonoscopy, number provided and she says she will call to schedule -she says she will to call for the mammogram, number provided -she wanted to get a bone density test, ordered, advised if abnormal will need appt with Darleene to go over treatment options -discussed vaccines due and where she could get each  Health Maintenance  Topic Date Due   Zoster Vaccines- Shingrix (1 of 2) Never done   Colonoscopy  09/11/2018   Bone Density Scan  Never done   Mammogram  10/18/2022   Influenza Vaccine  09/12/2023   COVID-19 Vaccine (4 - 2025-26 season) 10/13/2023   Medicare Annual Wellness (AWV)  01/05/2025   DTaP/Tdap/Td (3 - Td or Tdap) 12/02/2031   Pneumococcal Vaccine: 50+ Years  Completed   Hepatitis C Screening  Completed   Meningococcal B Vaccine  Aged [employer] and counseling on  the following was provided based on the above review of health and a plan/checklist for the patient, along with additional information discussed, was provided for the patient in the patient instructions :  -Advised on importance of completing advanced directives, discussed options for completing and provided information in patient instructions as well -discussed depressed mood and stress - she says she will consider counseling and number provided, she plans to see Darleene for this too - she says she will call office to schedule.  -Provided counseling and plan for increased risk of falling if applicable per above screening. Reviewed and demonstrated safe  balance exercises that can be done at home to improve balance and discussed exercise guidelines for adults with include balance exercises at least 3 days per week. Advised to consider PT. She plans to see Darleene in the office. Offered to schedule - she says she will call the office to schedule herself.  -Advised and counseled on a healthy lifestyle - including the importance of a healthy diet, regular physical activity, social connections and stress management. -Reviewed patient's current diet. Advised and counseled on a whole foods based healthy diet. A summary of a healthy diet was provided in the Patient Instructions.  -reviewed patient's current physical activity level and discussed exercise guidelines for adults. Discussed community resources and ideas for safe exercise at home to assist in meeting exercise guideline recommendations in a safe and healthy way.  -Advise yearly dental visits at minimum and regular eye exams -smoking cessation info provided - see patient instructions.    Follow up: see patient instructions     Patient Instructions  I really enjoyed getting to talk with you today! I am available on Tuesdays and Thursdays for virtual visits if you have any questions or concerns, or if I can be of any further assistance.   CHECKLIST FROM ANNUAL WELLNESS VISIT:  -Follow up (please call to schedule if not scheduled after visit):   -Please call our office to schedule in person visit with Darleene.   -yearly for annual wellness visit with primary care office  Here is a list of your preventive care/health maintenance measures and the plan for each if any are due:  PLAN For any measures below that may be due:    1. Please schedule colonoscopy: [phone]   2. Please schedule mammogram: [phone]   3. We ordered a bone density test, please call our office if not contacted by the schedulers in the next 1-2 weeks.    4. Can get vaccines at the pharmacy.   Health Maintenance   Topic Date Due   Zoster Vaccines- Shingrix (1 of 2) Never done   Colonoscopy  09/11/2018   Bone Density Scan  Never done   Mammogram  10/18/2022   Medicare Annual Wellness (AWV)  06/17/2023   Influenza Vaccine  09/12/2023   COVID-19 Vaccine (4 - 2025-26 season) 10/13/2023   DTaP/Tdap/Td (3 - Td or Tdap) 12/02/2031   Pneumococcal Vaccine: 50+ Years  Completed   Hepatitis C Screening  Completed   Meningococcal B Vaccine  Aged Out    -See a dentist at least yearly  -Get your eyes checked and then per your eye specialist's recommendations  -Other issues addressed today:   -I have included below further information regarding a healthy whole foods based diet, physical activity guidelines for adults, stress management and opportunities for social connections. I hope you find this information useful.   -----------------------------------------------------------------------------------------------------------------------------------------------------------------------------------------------------------------------------------------------------------    NUTRITION: -eat real food: lots of colorful vegetables (  half the plate) and fruits -5-7 servings of vegetables and fruits per day (fresh or steamed is best), exp. 2 servings of vegetables with lunch and dinner and 2 servings of fruit per day. Berries and greens such as kale and collards are great choices.  -consume on a regular basis:  fresh fruits, fresh veggies, fish, nuts, seeds, healthy oils (such as olive oil, avocado oil), whole grains (make sure for bread/pasta/crackers/etc., that the first ingredient on label contains the word whole), legumes. -can eat small amounts of dairy and lean meat (no larger than the palm of your hand), but avoid processed meats such as ham, bacon, lunch meat, etc. -drink water -try to avoid fast food and pre-packaged foods, processed meat, ultra processed foods/beverages (donuts, candy, etc.) -most  experts advise limiting sodium to < 2300mg  per day, should limit further is any chronic conditions such as high blood pressure, heart disease, diabetes, etc. The American Heart Association advised that < 1500mg  is is ideal -try to avoid foods/beverages that contain any ingredients with names you do not recognize  -try to avoid foods/beverages  with added sugar or sweeteners/sweets  -try to avoid sweet drinks (including diet drinks): soda, juice, Gatorade, sweet tea, power drinks, diet drinks -try to avoid white rice, white bread, pasta (unless whole grain)  EXERCISE GUIDELINES FOR ADULTS: -if you wish to increase your physical activity, do so gradually and with the approval of your doctor -STOP and seek medical care immediately if you have any chest pain, chest discomfort or trouble breathing when starting or increasing exercise  -move and stretch your body, legs, feet and arms when sitting for long periods -Physical activity guidelines for optimal health in adults: -get at least 150 minutes per week of moderate exercise (can talk, but not sing); this is about 20-30 minutes of sustained activity 5-7 days per week or two 10-15 minute episodes of sustained activity 5-7 days per week -do some muscle building/resistance training/strength training at least 2 days per week  -balance exercises 3+ days per week:   Stand somewhere where you have something sturdy to hold onto if you lose balance    1) lift up on toes, then back down, start with 5x per day and work up to 20x   2) stand and lift one leg straight out to the side so that foot is a few inches of the floor, start with 5x each side and work up to 20x each side   3) stand on one foot, start with 5 seconds each side and work up to 20 seconds on each side  If you need ideas or help with getting more active:  -Silver sneakers https://tools.silversneakers.com  -Walk with a Doc: Http://www.duncan-williams.com/  -try to include resistance (weight  lifting/strength building) and balance exercises twice per week: or the following link for ideas: http://castillo-powell.com/  buyducts.dk  STRESS MANAGEMENT: -can try meditating, or just sitting quietly with deep breathing while intentionally relaxing all parts of your body for 5 minutes daily -if you need further help with stress, anxiety or depression please follow up with your primary doctor or contact the wonderful folks at [facility]: [phone]  SOCIAL CONNECTIONS: -options in [place] if you wish to engage in more social and exercise related activities:  -Silver sneakers https://tools.silversneakers.com  -Walk with a Doc: Http://www.duncan-williams.com/  -Check out the [place] Active Adults 50+ section on the [place] of [employer] companies (hiking clubs, book clubs, cards and games, chess, exercise classes, aquatic classes and much more) - see the  website for details: https://www.[place].gov/departments/parks-recreation/active-adults50  -YouTube has lots of exercise videos for different ages and abilities as well  -[facility] (a variety of indoor and outdoor inperson activities for adults). [phone]. [address].  -Virtual Online Classes (a variety of topics): see seniorplanet.org or call [phone]  -consider volunteering at a school, hospice center, church, senior center or elsewhere   ADVANCED HEALTHCARE DIRECTIVES:  [place] Advanced Directives assistance:   expressweek.com.cy  Everyone should have advanced health care directives in place. This is so that you get the care you want, should you ever be in a situation where you are unable to make your own medical decisions.   From the [place] Advanced Directive Website: Advance Health Care Directives are legal documents in which you give  written instructions about your health care if, in the future, you cannot speak for yourself.   A health care power of attorney allows you to name a person you trust to make your health care decisions if you cannot make them yourself. A declaration of a desire for a natural death (or living will) is document, which states that you desire not to have your life prolonged by extraordinary measures if you have a terminal or incurable illness or if you are in a vegetative state. An advance instruction for mental health treatment makes a declaration of instructions, information and preferences regarding your mental health treatment. It also states that you are aware that the advance instruction authorizes a mental health treatment provider to act according to your wishes. It may also outline your consent or refusal of mental health treatment. A declaration of an anatomical gift allows anyone over the age of 55 to make a gift by will, organ donor card or other document.   Please see the following website or an elder law attorney for forms, FAQs and for completion of advanced directives: [place]  Print Production Planner Health Care Directives Advance Health Care Directives (http://guzman.com/)  Or copy and paste the following to your web browser: Poshchat.fi   Managing the Challenge of Quitting Smoking Quitting smoking is a physical and mental challenge. You may have cravings, withdrawal symptoms, and temptation to smoke. Before quitting, work with your health care provider to make a plan that can help you manage quitting. Making a plan before you quit may keep you from smoking when you have the urge to smoke while trying to quit. How to manage lifestyle changes Managing stress Stress can make you want to smoke, and wanting to smoke may cause stress. It is important to find ways to manage your stress. You could try some of the following: Practice relaxation techniques. Breathe slowly and  deeply, in through your nose and out through your mouth. Listen to music. Soak in a bath or take a shower. Imagine a peaceful place or vacation. Get some support. Talk with family or friends about your stress. Join a support group. Talk with a counselor or therapist. Get some physical activity. Go for a walk, run, or bike ride. Play a favorite sport. Practice yoga.  Medicines Talk with your health care provider about medicines that might help you deal with cravings and make quitting easier for you. Relationships Social situations can be difficult when you are quitting smoking. To manage this, you can: Avoid parties and other social situations where people might be smoking. Avoid alcohol. Leave right away if you have the urge to smoke. Explain to your family and friends that you are quitting smoking. Ask for support  and let them know you might be a bit grumpy. Plan activities where smoking is not an option. General instructions Be aware that many people gain weight after they quit smoking. However, not everyone does. To keep from gaining weight, have a plan in place before you quit, and stick to the plan after you quit. Your plan should include: Eating healthy snacks. When you have a craving, it may help to: Eat popcorn, or try carrots, celery, or other cut vegetables. Chew sugar-free gum. Changing how you eat. Eat small portion sizes at meals. Eat 4-6 small meals throughout the day instead of 1-2 large meals a day. Be mindful when you eat. You should avoid watching television or doing other things that might distract you as you eat. Exercising regularly. Make time to exercise each day. If you do not have time for a long workout, do short bouts of exercise for 5-10 minutes several times a day. Do some form of strengthening exercise, such as weight lifting. Do some exercise that gets your heart beating and causes you to breathe deeply, such as walking fast, running, swimming, or  biking. This is very important. Drinking plenty of water or other low-calorie or no-calorie drinks. Drink enough fluid to keep your urine pale yellow.  How to recognize withdrawal symptoms Your body and mind may experience discomfort as you try to get used to not having nicotine  in your system. These effects are called withdrawal symptoms. They may include: Feeling hungrier than normal. Having trouble concentrating. Feeling irritable or restless. Having trouble sleeping. Feeling depressed. Craving a cigarette. These symptoms may surprise you, but they are normal to have when quitting smoking. To manage withdrawal symptoms: Avoid places, people, and activities that trigger your cravings. Remember why you want to quit. Get plenty of sleep. Avoid coffee and other drinks that contain caffeine. These may worsen some of your symptoms. How to manage cravings Come up with a plan for how to deal with your cravings. The plan should include the following: A definition of the specific situation you want to deal with. An activity or action you will take to replace smoking. A clear idea for how this action will help. The name of someone who could help you with this. Cravings usually last for 5-10 minutes. Consider taking the following actions to help you with your plan to deal with cravings: Keep your mouth busy. Chew sugar-free gum. Suck on hard candies or a straw. Brush your teeth. Keep your hands and body busy. Change to a different activity right away. Squeeze or play with a ball. Do an activity or a hobby, such as making bead jewelry, practicing needlepoint, or working with wood. Mix up your normal routine. Take a short exercise break. Go for a quick walk, or run up and down stairs. Focus on doing something kind or helpful for someone else. Call a friend or family member to talk during a craving. Join a support group. Contact a quitline. Where to find support To get help or find a  support group: Call the National Cancer Institute's Smoking Quitline: 1-800-QUIT-NOW (620)528-4273) Text QUIT to SmokefreeTXT: 521151 Where to find more information Visit these websites to find more information on quitting smoking: U.S. Department of Health and Human Services: www.smokefree.gov American Lung Association: www.freedomfromsmoking.org Centers for Disease Control and Prevention (CDC): footballexhibition.com.br American Heart Association: www.heart.org Contact a health care provider if: You want to change your plan for quitting. The medicines you are taking are not helping. Your eating feels out of  control or you cannot sleep. You feel depressed or become very anxious. Summary Quitting smoking is a physical and mental challenge. You will face cravings, withdrawal symptoms, and temptation to smoke again. Preparation can help you as you go through these challenges. Try different techniques to manage stress, handle social situations, and prevent weight gain. You can deal with cravings by keeping your mouth busy (such as by chewing gum), keeping your hands and body busy, calling family or friends, or contacting a quitline for people who want to quit smoking. You can deal with withdrawal symptoms by avoiding places where people smoke, getting plenty of rest, and avoiding drinks that contain caffeine. This information is not intended to replace advice given to you by your health care provider. Make sure you discuss any questions you have with your health care provider. Document Revised: 01/19/2021 Document Reviewed: 01/19/2021 Elsevier Patient Education  2024 Elsevier Inc.   Quitting Smoking Learn how quitting smoking can help prevent many health problems and improve your overall health. To view the content, go to this web address: [email or web address]  This video will expire on: 01/22/2025. If you need access to this video following this date, please reach out to the healthcare provider  who assigned it to you. This information is not intended to replace advice given to you by your health care provider. Make sure you discuss any questions you have with your health care provider. Elsevier Patient Education  2024 Elsevier Inc.  How to [place] With Quitting Smoking Learn tips to help you cope with the challenges that come with quitting smoking. To view the content, go to this web address: [email or web address]  This video will expire on: 01/22/2025. If you need access to this video following this date, please reach out to the healthcare provider who assigned it to you. This information is not intended to replace advice given to you by your health care provider. Make sure you discuss any questions you have with your health care provider. Elsevier Patient Education  2024 [employer].  Chiquita JONELLE Cramp, [place]

## 2024-01-06 NOTE — Patient Instructions (Signed)
 I really enjoyed getting to talk with you today! I am available on Tuesdays and Thursdays for virtual visits if you have any questions or concerns, or if I can be of any further assistance.   CHECKLIST FROM ANNUAL WELLNESS VISIT:  -Follow up (please call to schedule if not scheduled after visit):   -Please call our office to schedule in person visit with Darleene.   -yearly for annual wellness visit with primary care office  Here is a list of your preventive care/health maintenance measures and the plan for each if any are due:  PLAN For any measures below that may be due:    1. Please schedule colonoscopy: [phone]   2. Please schedule mammogram: [phone]   3. We ordered a bone density test, please call our office if not contacted by the schedulers in the next 1-2 weeks.    4. Can get vaccines at the pharmacy.   Health Maintenance  Topic Date Due   Zoster Vaccines- Shingrix (1 of 2) Never done   Colonoscopy  09/11/2018   Bone Density Scan  Never done   Mammogram  10/18/2022   Medicare Annual Wellness (AWV)  06/17/2023   Influenza Vaccine  09/12/2023   COVID-19 Vaccine (4 - 2025-26 season) 10/13/2023   DTaP/Tdap/Td (3 - Td or Tdap) 12/02/2031   Pneumococcal Vaccine: 50+ Years  Completed   Hepatitis C Screening  Completed   Meningococcal B Vaccine  Aged Out    -See a dentist at least yearly  -Get your eyes checked and then per your eye specialist's recommendations  -Other issues addressed today:   -I have included below further information regarding a healthy whole foods based diet, physical activity guidelines for adults, stress management and opportunities for social connections. I hope you find this information useful.    -----------------------------------------------------------------------------------------------------------------------------------------------------------------------------------------------------------------------------------------------------------    NUTRITION: -eat real food: lots of colorful vegetables (half the plate) and fruits -5-7 servings of vegetables and fruits per day (fresh or steamed is best), exp. 2 servings of vegetables with lunch and dinner and 2 servings of fruit per day. Berries and greens such as kale and collards are great choices.  -consume on a regular basis:  fresh fruits, fresh veggies, fish, nuts, seeds, healthy oils (such as olive oil, avocado oil), whole grains (make sure for bread/pasta/crackers/etc., that the first ingredient on label contains the word whole), legumes. -can eat small amounts of dairy and lean meat (no larger than the palm of your hand), but avoid processed meats such as ham, bacon, lunch meat, etc. -drink water -try to avoid fast food and pre-packaged foods, processed meat, ultra processed foods/beverages (donuts, candy, etc.) -most experts advise limiting sodium to < 2300mg  per day, should limit further is any chronic conditions such as high blood pressure, heart disease, diabetes, etc. The American Heart Association advised that < 1500mg  is is ideal -try to avoid foods/beverages that contain any ingredients with names you do not recognize  -try to avoid foods/beverages  with added sugar or sweeteners/sweets  -try to avoid sweet drinks (including diet drinks): soda, juice, Gatorade, sweet tea, power drinks, diet drinks -try to avoid white rice, white bread, pasta (unless whole grain)  EXERCISE GUIDELINES FOR ADULTS: -if you wish to increase your physical activity, do so gradually and with the approval of your doctor -STOP and seek medical care immediately if you have any chest pain, chest discomfort or trouble breathing when starting or  increasing exercise  -move and stretch your body, legs, feet and arms  when sitting for long periods -Physical activity guidelines for optimal health in adults: -get at least 150 minutes per week of moderate exercise (can talk, but not sing); this is about 20-30 minutes of sustained activity 5-7 days per week or two 10-15 minute episodes of sustained activity 5-7 days per week -do some muscle building/resistance training/strength training at least 2 days per week  -balance exercises 3+ days per week:   Stand somewhere where you have something sturdy to hold onto if you lose balance    1) lift up on toes, then back down, start with 5x per day and work up to 20x   2) stand and lift one leg straight out to the side so that foot is a few inches of the floor, start with 5x each side and work up to 20x each side   3) stand on one foot, start with 5 seconds each side and work up to 20 seconds on each side  If you need ideas or help with getting more active:  -Silver sneakers https://tools.silversneakers.com  -Walk with a Doc: Http://www.duncan-williams.com/  -try to include resistance (weight lifting/strength building) and balance exercises twice per week: or the following link for ideas: http://castillo-powell.com/  buyducts.dk  STRESS MANAGEMENT: -can try meditating, or just sitting quietly with deep breathing while intentionally relaxing all parts of your body for 5 minutes daily -if you need further help with stress, anxiety or depression please follow up with your primary doctor or contact the wonderful folks at [facility]: [phone]  SOCIAL CONNECTIONS: -options in [place] if you wish to engage in more social and exercise related activities:  -Silver sneakers https://tools.silversneakers.com  -Walk with a Doc: Http://www.duncan-williams.com/  -Check out the [facility] Active Adults 50+  section on the [place] [employer] (hiking clubs, book clubs, cards and games, chess, exercise classes, aquatic classes and much more) - see the website for details: https://www.[place].gov/departments/parks-recreation/active-adults50  -YouTube has lots of exercise videos for different ages and abilities as well  -[facility] (a variety of indoor and outdoor inperson activities for adults). [phone]. [address]  -Virtual Online Classes (a variety of topics): see seniorplanet.org or call ([phone]) [phone]  -consider volunteering at a school, hospice center, church, senior center or elsewhere   ADVANCED HEALTHCARE DIRECTIVES:  [place] Advanced Directives assistance:   expressweek.com.cy  Everyone should have advanced health care directives in place. This is so that you get the care you want, should you ever be in a situation where you are unable to make your own medical decisions.   From the [place] Advanced Directive Website: Advance Health Care Directives are legal documents in which you give written instructions about your health care if, in the future, you cannot speak for yourself.   A health care power of attorney allows you to name a person you trust to make your health care decisions if you cannot make them yourself. A declaration of a desire for a natural death (or living will) is document, which states that you desire not to have your life prolonged by extraordinary measures if you have a terminal or incurable illness or if you are in a vegetative state. An advance instruction for mental health treatment makes a declaration of instructions, information and preferences regarding your mental health treatment. It also states that you are aware that the advance instruction authorizes a mental health treatment provider to act according to your wishes. It may also outline your consent or refusal of mental  health treatment. A declaration  of an anatomical gift allows anyone over the age of 39 to make a gift by will, organ donor card or other document.   Please see the following website or an elder law attorney for forms, FAQs and for completion of advanced directives:   Print [employer] Health Care Directives Advance Health Care Directives (http://guzman.com/)  Or copy and paste the following to your web browser: Poshchat.fi   Managing the Challenge of Quitting Smoking Quitting smoking is a physical and mental challenge. You may have cravings, withdrawal symptoms, and temptation to smoke. Before quitting, work with your health care provider to make a plan that can help you manage quitting. Making a plan before you quit may keep you from smoking when you have the urge to smoke while trying to quit. How to manage lifestyle changes Managing stress Stress can make you want to smoke, and wanting to smoke may cause stress. It is important to find ways to manage your stress. You could try some of the following: Practice relaxation techniques. Breathe slowly and deeply, in through your nose and out through your mouth. Listen to music. Soak in a bath or take a shower. Imagine a peaceful place or vacation. Get some support. Talk with family or friends about your stress. Join a support group. Talk with a counselor or therapist. Get some physical activity. Go for a walk, run, or bike ride. Play a favorite sport. Practice yoga.  Medicines Talk with your health care provider about medicines that might help you deal with cravings and make quitting easier for you. Relationships Social situations can be difficult when you are quitting smoking. To manage this, you can: Avoid parties and other social situations where people might be smoking. Avoid alcohol. Leave right away if you have the urge to smoke. Explain to your family and friends that you are quitting smoking. Ask  for support and let them know you might be a bit grumpy. Plan activities where smoking is not an option. General instructions Be aware that many people gain weight after they quit smoking. However, not everyone does. To keep from gaining weight, have a plan in place before you quit, and stick to the plan after you quit. Your plan should include: Eating healthy snacks. When you have a craving, it may help to: Eat popcorn, or try carrots, celery, or other cut vegetables. Chew sugar-free gum. Changing how you eat. Eat small portion sizes at meals. Eat 4-6 small meals throughout the day instead of 1-2 large meals a day. Be mindful when you eat. You should avoid watching television or doing other things that might distract you as you eat. Exercising regularly. Make time to exercise each day. If you do not have time for a long workout, do short bouts of exercise for 5-10 minutes several times a day. Do some form of strengthening exercise, such as weight lifting. Do some exercise that gets your heart beating and causes you to breathe deeply, such as walking fast, running, swimming, or biking. This is very important. Drinking plenty of water or other low-calorie or no-calorie drinks. Drink enough fluid to keep your urine pale yellow.  How to recognize withdrawal symptoms Your body and mind may experience discomfort as you try to get used to not having nicotine  in your system. These effects are called withdrawal symptoms. They may include: Feeling hungrier than normal. Having trouble concentrating. Feeling irritable or restless. Having trouble sleeping. Feeling depressed. Craving a cigarette. These symptoms may surprise you, but they are  normal to have when quitting smoking. To manage withdrawal symptoms: Avoid places, people, and activities that trigger your cravings. Remember why you want to quit. Get plenty of sleep. Avoid coffee and other drinks that contain caffeine. These may worsen some  of your symptoms. How to manage cravings Come up with a plan for how to deal with your cravings. The plan should include the following: A definition of the specific situation you want to deal with. An activity or action you will take to replace smoking. A clear idea for how this action will help. The name of someone who could help you with this. Cravings usually last for 5-10 minutes. Consider taking the following actions to help you with your plan to deal with cravings: Keep your mouth busy. Chew sugar-free gum. Suck on hard candies or a straw. Brush your teeth. Keep your hands and body busy. Change to a different activity right away. Squeeze or play with a ball. Do an activity or a hobby, such as making bead jewelry, practicing needlepoint, or working with wood. Mix up your normal routine. Take a short exercise break. Go for a quick walk, or run up and down stairs. Focus on doing something kind or helpful for someone else. Call a friend or family member to talk during a craving. Join a support group. Contact a quitline. Where to find support To get help or find a support group: Call the National Cancer Institute's Smoking Quitline: 1-800-QUIT-NOW 719-110-3919) Text QUIT to SmokefreeTXT: 521151 Where to find more information Visit these websites to find more information on quitting smoking: U.S. Department of Health and Human Services: www.smokefree.gov American Lung Association: www.freedomfromsmoking.org Centers for Disease Control and Prevention (CDC): footballexhibition.com.br American Heart Association: www.heart.org Contact a health care provider if: You want to change your plan for quitting. The medicines you are taking are not helping. Your eating feels out of control or you cannot sleep. You feel depressed or become very anxious. Summary Quitting smoking is a physical and mental challenge. You will face cravings, withdrawal symptoms, and temptation to smoke again. Preparation can help  you as you go through these challenges. Try different techniques to manage stress, handle social situations, and prevent weight gain. You can deal with cravings by keeping your mouth busy (such as by chewing gum), keeping your hands and body busy, calling family or friends, or contacting a quitline for people who want to quit smoking. You can deal with withdrawal symptoms by avoiding places where people smoke, getting plenty of rest, and avoiding drinks that contain caffeine. This information is not intended to replace advice given to you by your health care provider. Make sure you discuss any questions you have with your health care provider. Document Revised: 01/19/2021 Document Reviewed: 01/19/2021 Elsevier Patient Education  2024 Elsevier Inc.   Quitting Smoking Learn how quitting smoking can help prevent many health problems and improve your overall health. To view the content, go to this web address: [email or web address]  This video will expire on: 01/22/2025. If you need access to this video following this date, please reach out to the healthcare provider who assigned it to you. This information is not intended to replace advice given to you by your health care provider. Make sure you discuss any questions you have with your health care provider. Elsevier Patient Education  2024 Elsevier Inc.  How to [place] With Quitting Smoking Learn tips to help you cope with the challenges that come with quitting smoking. To view the content, go to this  web address: [email or web address]  This video will expire on: 01/22/2025. If you need access to this video following this date, please reach out to the healthcare provider who assigned it to you. This information is not intended to replace advice given to you by your health care provider. Make sure you discuss any questions you have with your health care provider. Elsevier Patient Education  2024 [employer].

## 2024-01-15 ENCOUNTER — Telehealth: Payer: Self-pay | Admitting: Adult Health

## 2024-01-15 NOTE — Telephone Encounter (Signed)
 Copied from CRM #[id number]. Topic: General - Other >> Jan 15, 2024 12:39 PM Alexandria E wrote: Reason for CRM: Patient is having neck surgery and she is needing a surgical clearance form faxed over to her surgeon. Dr. Victory Gens. Patient does not know the fax number for that office.

## 2024-01-15 NOTE — Telephone Encounter (Signed)
 This was already done last week. Not sure if pt followed up with provider.

## 2024-01-15 NOTE — Telephone Encounter (Signed)
 Copied from CRM #[id number]. Topic: General - Other >> Jan 15, 2024 12:39 PM Laurie [name] wrote: Reason for CRM: Patient is having neck surgery and she is needing a surgical clearance form faxed over to her surgeon. Dr. Victory Gens. Patient does not know the fax number for that office.

## 2024-01-16 NOTE — Therapy (Incomplete)
 OUTPATIENT PHYSICAL THERAPY LOWER EXTREMITY EVALUATION   Patient Name: Laurie [name]  IVORI STORR MRN: [id number] DOB:Dec 29, 1955, 68 y.o., female Today's Date: 01/16/2024  END OF SESSION:   Past Medical History:  Diagnosis Date   Allergy    Anxiety    Arthritis    RA   Asthma    Chronic back pain    COPD (chronic obstructive pulmonary disease) (HCC)    Coronary artery disease    Depression    Esophageal stricture    GERD (gastroesophageal reflux disease)    Heart murmur    Hematemesis 09/10/2018   Hyperlipidemia    Hypertension    IBS (irritable bowel syndrome)    Interstitial cystitis    Neuromuscular disorder (HCC)    fibromyalgia   PONV (postoperative nausea and vomiting)    Past Surgical History:  Procedure Laterality Date   ABDOMINAL HYSTERECTOMY     APPENDECTOMY     BIOPSY  09/11/2018   Procedure: BIOPSY;  Surgeon: Wilhelmenia Aloha Raddle., MD;  Location: [facility] ENDOSCOPY;  Service: Gastroenterology;;   CERVICAL FUSION     CERVICAL LAMINECTOMY     CORONARY STENT INTERVENTION N/A 10/07/2019   Procedure: CORONARY STENT INTERVENTION;  Surgeon: Verlin Lonni JONETTA, MD;  Location: [facility] INVASIVE CV LAB;  Service: Cardiovascular;  Laterality: N/A;   ESOPHAGOGASTRODUODENOSCOPY     ESOPHAGOGASTRODUODENOSCOPY (EGD) WITH PROPOFOL  N/A 09/11/2018   Procedure: ESOPHAGOGASTRODUODENOSCOPY (EGD) WITH PROPOFOL ;  Surgeon: Wilhelmenia Aloha Raddle., MD;  Location: [facility] ENDOSCOPY;  Service: Gastroenterology;  Laterality: N/A;   FINGER SURGERY     KYPHOPLASTY N/A 03/29/2021   Procedure: Lumbar Three KYPHOPLASTY;  Surgeon: Colon Shove, MD;  Location: [facility] OR;  Service: Neurosurgery;  Laterality: N/A;   LEFT HEART CATH AND CORONARY ANGIOGRAPHY N/A 10/07/2019   Procedure: LEFT HEART CATH AND CORONARY ANGIOGRAPHY;  Surgeon: Verlin Lonni JONETTA, MD;  Location: [facility] INVASIVE CV LAB;  Service: Cardiovascular;  Laterality: N/A;   LEFT HEART CATH AND CORONARY ANGIOGRAPHY N/A 06/20/2022   Procedure: LEFT HEART  CATH AND CORONARY ANGIOGRAPHY;  Surgeon: Verlin Lonni JONETTA, MD;  Location: [facility] INVASIVE CV LAB;  Service: Cardiovascular;  Laterality: N/A;   LUMBAR FUSION     SAVORY DILATION N/A 09/11/2018   Procedure: SAVORY DILATION;  Surgeon: Wilhelmenia Aloha Raddle., MD;  Location: [facility] ENDOSCOPY;  Service: Gastroenterology;  Laterality: N/A;   TONSILLECTOMY     Patient Active Problem List   Diagnosis Date Noted   Fracture, lumbar vertebra, compression (HCC) 03/29/2021   Eye swelling 04/27/2020   Hyperthyroidism 02/19/2020   Coronary artery disease involving native coronary artery of native heart with unstable angina pectoris (HCC)    Substance abuse (HCC) 07/23/2019   Renal insufficiency 09/10/2018   Elevated troponin 09/10/2018   Leukocytosis 09/10/2018   Hematemesis 09/09/2018   Opiate withdrawal (HCC) 09/09/2018   Vitamin D  deficiency 06/10/2018   Shortness of breath 11/05/2017   Elevated brain natriuretic peptide (BNP) level 11/05/2017   Polysubstance (excluding opioids) dependence, daily use (HCC) 11/05/2017   AKI (acute kidney injury) 11/05/2017   Low serum vitamin D  08/23/2015   Hyperlipidemia 11/08/2014   Reflux esophagitis 03/23/2014   Dysphagia, pharyngoesophageal phase 03/23/2014   Pain in the chest 03/23/2014   Special screening for malignant neoplasms, colon 07/09/2013   Chest pain 04/23/2012   Dyspnea 04/23/2012   Murmur 04/23/2012   INTERSTITIAL CYSTITIS 01/01/2010   SWELLING, NECK 01/01/2010   CAD, NATIVE VESSEL 12/19/2009   Palpitations 11/21/2009   ABNORMAL CV (STRESS) TEST 11/21/2009   Essential hypertension 10/31/2009  ELECTROCARDIOGRAM, ABNORMAL 10/25/2009   PLANTAR FASCIITIS, BILATERAL 10/12/2009   CERVICAL RADICULOPATHY 07/04/2008   Pain in soft tissues of limb 03/25/2008   DEGENERATIVE DISC DISEASE, CERVICAL SPINE, W/RADICULOPATHY 09/08/2007   LOW BACK PAIN 09/08/2007   TOBACCO USE 03/23/2007   OSTEOARTHROSIS, LOCAL NOS, LOWER LEG 11/07/2006   STATE,  SYMPTOMATIC MENOPAUSE/FEM CLIMACTERIC 10/27/2006   Depression, recurrent (HCC) 09/15/2006   Thoracic or lumbosacral neuritis or radiculitis, unspecified 09/15/2006   IBS 09/09/2006   STRICTURE, ESOPHAGEAL 03/16/2001    PCP: Darleene Shape, NP  REFERRING PROVIDER: same  REFERRING DIAG:  Diagnosis  R26.81 (ICD-10-CM) - Gait instability   THERAPY DIAG:  No diagnosis found.  Rationale for Evaluation and Treatment: {HABREHAB:27488}  ONSET DATE: ***  SUBJECTIVE:   SUBJECTIVE STATEMENT: Give prescription for RW from MD.   PERTINENT HISTORY: Hx of cervical fusion in ?, lumbar fusion in ?, currently has anterolisthesis of C4 on C5 - cervical MRI 01/02/24 shows slipped vertebrae increasing since last MRI - has neck surgery planned. COPD, CAD,  PAIN:  Are you having pain? {OPRCPAIN:27236}  PRECAUTIONS: {Therapy precautions:24002}  RED FLAGS: {PT Red Flags:29287}   WEIGHT BEARING RESTRICTIONS: {Yes ***/No:24003}  FALLS:  Has patient fallen in last 6 months? {fallsyesno:27318}  LIVING ENVIRONMENT: Lives with: {OPRC lives with:25569::lives with their family} Lives in: {Lives in:25570} Stairs: {opstairs:27293} Has following equipment at home: {Assistive devices:23999}  OCCUPATION: ***  PLOF: {PLOF:24004}  PATIENT GOALS: ***  NEXT MD VISIT: ***  OBJECTIVE:  Note: Objective measures were completed at Evaluation unless otherwise noted.  DIAGNOSTIC FINDINGS: IMPRESSION: 1. Anterolisthesis of C4 on C5, slightly increased compared to prior MRI. 2. Postsurgical changes at C3-4 and C5-6  PATIENT SURVEYS:  {rehab surveys:24030}  COGNITION: Overall cognitive status: {cognition:24006}     SENSATION: {sensation:27233}  EDEMA:  {edema:24020}  MUSCLE LENGTH: Hamstrings: Right *** deg; Left *** deg Debby test: Right *** deg; Left *** deg  POSTURE: {posture:25561}  PALPATION: ***  LOWER EXTREMITY ROM:  {AROM/PROM:27142} ROM Right eval Left eval  Hip flexion     Hip extension    Hip abduction    Hip adduction    Hip internal rotation    Hip external rotation    Knee flexion    Knee extension    Ankle dorsiflexion    Ankle plantarflexion    Ankle inversion    Ankle eversion     (Blank rows = not tested)  LOWER EXTREMITY MMT:  MMT Right eval Left eval  Hip flexion    Hip extension    Hip abduction    Hip adduction    Hip internal rotation    Hip external rotation    Knee flexion    Knee extension    Ankle dorsiflexion    Ankle plantarflexion    Ankle inversion    Ankle eversion     (Blank rows = not tested)  LOWER EXTREMITY SPECIAL TESTS:  {LEspecialtests:26242}  FUNCTIONAL TESTS:  {Functional tests:24029}  GAIT: Distance walked: *** Assistive device utilized: {Assistive devices:23999} Level of assistance: {Levels of assistance:24026} Comments: ***  TREATMENT DATE: ***    PATIENT EDUCATION:  Education details: *** Person educated: {Person educated:25204} Education method: {Education Method:25205} Education comprehension: {Education Comprehension:25206}  HOME EXERCISE PROGRAM: ***  ASSESSMENT:  CLINICAL IMPRESSION: Patient is a *** y.o. *** who was seen today for physical therapy evaluation and treatment for ***.   OBJECTIVE IMPAIRMENTS: {opptimpairments:25111}.   ACTIVITY LIMITATIONS: {activitylimitations:27494}  PARTICIPATION LIMITATIONS: {participationrestrictions:25113}  PERSONAL FACTORS: {Personal factors:25162} are also affecting patient's functional outcome.   REHAB POTENTIAL: {rehabpotential:25112}  CLINICAL DECISION MAKING: {clinical decision making:25114}  EVALUATION COMPLEXITY: {Evaluation complexity:25115}   GOALS: Goals reviewed with patient? {yes/no:20286}  SHORT TERM GOALS: Target date: *** *** Baseline: Goal status: INITIAL  2.  *** Baseline:   Goal status: INITIAL  3.  *** Baseline:  Goal status: INITIAL  4.  *** Baseline:  Goal status: INITIAL  5.  *** Baseline:  Goal status: INITIAL  6.  *** Baseline:  Goal status: INITIAL  LONG TERM GOALS: Target date: ***  *** Baseline:  Goal status: INITIAL  2.  *** Baseline:  Goal status: INITIAL  3.  *** Baseline:  Goal status: INITIAL  4.  *** Baseline:  Goal status: INITIAL  5.  *** Baseline:  Goal status: INITIAL  6.  *** Baseline:  Goal status: INITIAL   PLAN:  PT FREQUENCY: {rehab frequency:25116}  PT DURATION: {rehab duration:25117}  PLANNED INTERVENTIONS: {rehab planned interventions:25118::97110-Therapeutic exercises,97530- Therapeutic [phone]- Neuromuscular re-education,97535- Self Rjmz,02859- Manual therapy,Patient/Family education}  PLAN FOR NEXT SESSION: ***   Larue Saddie SAUNDERS, PT 01/16/2024, 11:30 AM

## 2024-01-16 NOTE — Telephone Encounter (Signed)
 Spoke to pt and she advised that the clearance form was not received. I advised that it was sent with confirmation 01/05/2024. I also informed pt that I will refax it. Pt verbalized understanding. Form was refaxed with confirmation.

## 2024-01-19 ENCOUNTER — Ambulatory Visit: Attending: Adult Health | Admitting: Rehabilitation

## 2024-01-22 ENCOUNTER — Other Ambulatory Visit: Payer: Self-pay | Admitting: Neurological Surgery

## 2024-02-13 NOTE — Pre-Procedure Instructions (Signed)
 Surgical Instructions   Your procedure is scheduled on February 19, 2024. Report to [facility] Main Entrance A at 8:15 A.M., then check in with the Admitting office. Any questions or running late day of surgery: call [phone]  Questions prior to your surgery date: call [phone], Monday-Friday, 8am-4pm. If you experience any cold or flu symptoms such as cough, fever, chills, shortness of breath, etc. between now and your scheduled surgery, please notify us  at the above number.     Remember:  Do not eat or drink after midnight the night before your surgery    Take these medicines the morning of surgery with A SIP OF WATER: cephALEXin  (KEFLEX )  fluticasone  (FLONASE ) nasal spray  methadone  (DOLOPHINE )  methimazole  (TAPAZOLE )  metoprolol  succinate (TOPROL -XL)  pantoprazole  (PROTONIX )  venlafaxine  XR (EFFEXOR -XR)    May take these medicines IF NEEDED: albuterol  (VENTOLIN  HFA) inhaler - please bring inhaler with you morning of surgery HYDROcodone -acetaminophen  (NORCO/VICODIN)  meclizine  (ANTIVERT )  nitroGLYCERIN  (NITROSTAT ) - if dose taken prior to surgery, please call [phone]   Follow your surgeon's instructions on when to stop Aspirin  and clopidogrel  (PLAVIX ).  If no instructions were given by your surgeon then you will need to call the office to get those instructions.     One week prior to surgery, STOP taking any Aleve , Naproxen , Ibuprofen , Motrin , Advil , Goody's, BC's, all herbal medications, fish oil, and non-prescription vitamins.                     Do NOT Smoke (Tobacco/Vaping) for 24 hours prior to your procedure.  If you use a CPAP at night, you may bring your mask/headgear for your overnight stay.   You will be asked to remove any contacts, glasses, piercing's, hearing aid's, dentures/partials prior to surgery. Please bring cases for these items if needed.    Patients discharged the day of surgery will not be allowed to drive home, and someone needs to  stay with them for 24 hours.  SURGICAL WAITING ROOM VISITATION Patients may have no more than 2 support people in the waiting area - these visitors may rotate.   Pre-op nurse will coordinate an appropriate time for 1 ADULT support person, who may not rotate, to accompany patient in pre-op.  Children under the age of 49 must have an adult with them who is not the patient and must remain in the main waiting area with an adult.  If the patient needs to stay at the hospital during part of their recovery, the visitor guidelines for inpatient rooms apply.  Please refer to the [facility] website for the visitor guidelines for any additional information.   If you received a COVID test during your pre-op visit  it is requested that you wear a mask when out in public, stay away from anyone that may not be feeling well and notify your surgeon if you develop symptoms. If you have been in contact with anyone that has tested positive in the last 10 days please notify you surgeon.      Pre-operative 4 CHG Bathing Instructions   You can play a key role in reducing the risk of infection after surgery. Your skin needs to be as free of germs as possible. You can reduce the number of germs on your skin by washing with CHG (chlorhexidine  gluconate) soap before surgery. CHG is an antiseptic soap that kills germs and continues to kill germs even after washing.   DO NOT use if you have an allergy to  chlorhexidine /CHG or antibacterial soaps. If your skin becomes reddened or irritated, stop using the CHG and notify one of our RNs at [phone].   Please shower with the CHG soap starting 4 days before surgery using the following schedule:     Please keep in mind the following:  DO NOT shave, including legs and underarms, starting the day of your first shower.   You may shave your face at any point before/day of surgery.  Place clean sheets on your bed the day you start using CHG soap. Use a clean washcloth  (not used since being washed) for each shower. DO NOT sleep with pets once you start using the CHG.   CHG Shower Instructions:  Wash your face and private area with normal soap. If you choose to wash your hair, wash first with your normal shampoo.  After you use shampoo/soap, rinse your hair and body thoroughly to remove shampoo/soap residue.  Turn the water OFF and apply  bottle of CHG soap to a CLEAN washcloth.  Apply CHG soap ONLY FROM YOUR NECK DOWN TO YOUR TOES (washing for 3-5 minutes)  DO NOT use CHG soap on face, private areas, open wounds, or sores.  Pay special attention to the area where your surgery is being performed.  If you are having back surgery, having someone wash your back for you may be helpful. Wait 2 minutes after CHG soap is applied, then you may rinse off the CHG soap.  Pat dry with a clean towel  Put on clean clothes/pajamas   If you choose to wear lotion, please use ONLY the CHG-compatible lotions that are listed below.  Additional instructions for the day of surgery:  If you choose, you may shower the morning of surgery with an antibacterial soap.  DO NOT APPLY any lotions, deodorants, cologne, or perfumes.   Do not bring valuables to the hospital. [facility] is not responsible for any belongings/valuables. Do not wear nail polish, gel polish, artificial nails, or any other type of covering on natural nails (fingers and toes) Do not wear jewelry or makeup Put on clean/comfortable clothes.  Please brush your teeth.  Ask your nurse before applying any prescription medications to the skin.     CHG Compatible Lotions   Aveeno Moisturizing lotion  Cetaphil Moisturizing Cream  Cetaphil Moisturizing Lotion  Clairol Herbal Essence Moisturizing Lotion, Dry Skin  Clairol Herbal Essence Moisturizing Lotion, Extra Dry Skin  Clairol Herbal Essence Moisturizing Lotion, Normal Skin  Curel Age Defying Therapeutic Moisturizing Lotion with Alpha Hydroxy  Curel  Extreme Care Body Lotion  Curel Soothing Hands Moisturizing Hand Lotion  Curel Therapeutic Moisturizing Cream, Fragrance-Free  Curel Therapeutic Moisturizing Lotion, Fragrance-Free  Curel Therapeutic Moisturizing Lotion, Original Formula  Eucerin Daily Replenishing Lotion  Eucerin Dry Skin Therapy Plus Alpha Hydroxy Crme  Eucerin Dry Skin Therapy Plus Alpha Hydroxy Lotion  Eucerin Original Crme  Eucerin Original Lotion  Eucerin Plus Crme Eucerin Plus Lotion  Eucerin TriLipid Replenishing Lotion  Keri Anti-Bacterial Hand Lotion  Keri Deep Conditioning Original Lotion Dry Skin Formula Softly Scented  Keri Deep Conditioning Original Lotion, Fragrance Free Sensitive Skin Formula  Keri Lotion Fast Absorbing Fragrance Free Sensitive Skin Formula  Keri Lotion Fast Absorbing Softly Scented Dry Skin Formula  Keri Original Lotion  Keri Skin Renewal Lotion Keri Silky Smooth Lotion  Keri Silky Smooth Sensitive Skin Lotion  Nivea Body Creamy Conditioning Oil  Nivea Body Extra Enriched Regulatory Affairs Officer  Nivea Crme  Nivea Skin Firming Lotion  NutraDerm 30 Skin Lotion  NutraDerm Skin Lotion  NutraDerm Therapeutic Skin Cream  NutraDerm Therapeutic Skin Lotion  ProShield Protective Hand Cream  Provon moisturizing lotion  Please read over the following fact sheets that you were given.

## 2024-02-16 ENCOUNTER — Other Ambulatory Visit: Payer: Self-pay

## 2024-02-16 ENCOUNTER — Encounter (HOSPITAL_COMMUNITY): Payer: Self-pay

## 2024-02-16 ENCOUNTER — Encounter (HOSPITAL_COMMUNITY)
Admission: RE | Admit: 2024-02-16 | Discharge: 2024-02-16 | Disposition: A | Discharge status: Home / Self Care | Source: Ambulatory Visit | Attending: Neurological Surgery | Admitting: Neurological Surgery

## 2024-02-16 VITALS — BP 164/77 | HR 70 | Temp 98.0°F | Resp 17 | Ht 71.0 in | Wt 143.0 lb

## 2024-02-16 DIAGNOSIS — I2510 Atherosclerotic heart disease of native coronary artery without angina pectoris: Secondary | ICD-10-CM | POA: Diagnosis not present

## 2024-02-16 DIAGNOSIS — F419 Anxiety disorder, unspecified: Secondary | ICD-10-CM | POA: Insufficient documentation

## 2024-02-16 DIAGNOSIS — M5412 Radiculopathy, cervical region: Secondary | ICD-10-CM | POA: Insufficient documentation

## 2024-02-16 DIAGNOSIS — Z7902 Long term (current) use of antithrombotics/antiplatelets: Secondary | ICD-10-CM | POA: Insufficient documentation

## 2024-02-16 DIAGNOSIS — M549 Dorsalgia, unspecified: Secondary | ICD-10-CM | POA: Diagnosis not present

## 2024-02-16 DIAGNOSIS — Z955 Presence of coronary angioplasty implant and graft: Secondary | ICD-10-CM | POA: Diagnosis not present

## 2024-02-16 DIAGNOSIS — K219 Gastro-esophageal reflux disease without esophagitis: Secondary | ICD-10-CM | POA: Insufficient documentation

## 2024-02-16 DIAGNOSIS — G8929 Other chronic pain: Secondary | ICD-10-CM | POA: Diagnosis not present

## 2024-02-16 DIAGNOSIS — B029 Zoster without complications: Secondary | ICD-10-CM | POA: Diagnosis not present

## 2024-02-16 DIAGNOSIS — Z01818 Encounter for other preprocedural examination: Secondary | ICD-10-CM | POA: Diagnosis present

## 2024-02-16 DIAGNOSIS — J4489 Other specified chronic obstructive pulmonary disease: Secondary | ICD-10-CM | POA: Insufficient documentation

## 2024-02-16 DIAGNOSIS — M797 Fibromyalgia: Secondary | ICD-10-CM | POA: Insufficient documentation

## 2024-02-16 DIAGNOSIS — R7689 Other specified abnormal immunological findings in serum: Secondary | ICD-10-CM | POA: Diagnosis not present

## 2024-02-16 DIAGNOSIS — F17210 Nicotine dependence, cigarettes, uncomplicated: Secondary | ICD-10-CM | POA: Insufficient documentation

## 2024-02-16 DIAGNOSIS — Z01812 Encounter for preprocedural laboratory examination: Secondary | ICD-10-CM | POA: Insufficient documentation

## 2024-02-16 DIAGNOSIS — K589 Irritable bowel syndrome without diarrhea: Secondary | ICD-10-CM | POA: Diagnosis not present

## 2024-02-16 DIAGNOSIS — E785 Hyperlipidemia, unspecified: Secondary | ICD-10-CM | POA: Diagnosis not present

## 2024-02-16 DIAGNOSIS — Z7982 Long term (current) use of aspirin: Secondary | ICD-10-CM | POA: Insufficient documentation

## 2024-02-16 DIAGNOSIS — N3010 Interstitial cystitis (chronic) without hematuria: Secondary | ICD-10-CM | POA: Insufficient documentation

## 2024-02-16 LAB — TYPE AND SCREEN
ABO/RH(D): A POS
Antibody Screen: NEGATIVE

## 2024-02-16 LAB — CBC
HCT: 37 % (ref 36.0–46.0)
Hemoglobin: 11.7 g/dL — ABNORMAL LOW (ref 12.0–15.0)
MCH: 27.5 pg (ref 26.0–34.0)
MCHC: 31.6 g/dL (ref 30.0–36.0)
MCV: 86.9 fL (ref 80.0–100.0)
Platelets: 280 K/uL (ref 150–400)
RBC: 4.26 MIL/uL (ref 3.87–5.11)
RDW: 12.7 % (ref 11.5–15.5)
WBC: 9 K/uL (ref 4.0–10.5)
nRBC: 0 % (ref 0.0–0.2)

## 2024-02-16 LAB — BASIC METABOLIC PANEL WITH GFR
Anion gap: 10 (ref 5–15)
BUN: 24 mg/dL — ABNORMAL HIGH (ref 8–23)
CO2: 28 mmol/L (ref 22–32)
Calcium: 10 mg/dL (ref 8.9–10.3)
Chloride: 99 mmol/L (ref 98–111)
Creatinine, Ser: 1.08 mg/dL — ABNORMAL HIGH (ref 0.44–1.00)
GFR, Estimated: 55 mL/min — ABNORMAL LOW
Glucose, Bld: 106 mg/dL — ABNORMAL HIGH (ref 70–99)
Potassium: 4.3 mmol/L (ref 3.5–5.1)
Sodium: 138 mmol/L (ref 135–145)

## 2024-02-16 LAB — SURGICAL PCR SCREEN
MRSA, PCR: NEGATIVE
Staphylococcus aureus: NEGATIVE

## 2024-02-16 NOTE — Progress Notes (Signed)
 PCP - Dr. Darleene Shape, NP Cardiologist - Lonni Range, MD  PPM/ICD - Denies Device Orders - n/a Rep Notified - n/a  Chest x-ray - denies EKG - 05-12-23 Stress Test - 10-30-09 ECHO - 10-08-21 Cardiac Cath - 06-20-22  Sleep Study - denies CPAP - n/a  Non-diabetic  Last dose of GLP1 agonist-  denies GLP1 instructions: n/a  Blood Thinner Instructions: Plavix  Aspirin  Instructions: Per patient has held for over 1 week. Will restart after surgery.  ERAS Protcol - NPO PRE-SURGERY Ensure or G2- n/a  COVID TEST- n/a   Anesthesia review:   Patient denies shortness of breath, fever, cough and chest pain at PAT appointment   All instructions explained to the patient, with a verbal understanding of the material. Patient agrees to go over the instructions while at home for a better understanding. Patient also instructed to self quarantine after being tested for COVID-19. The opportunity to ask questions was provided.

## 2024-02-16 NOTE — Progress Notes (Signed)
 Surgical Instructions     Your procedure is scheduled on February 19, 2024. Report to [facility] Main Entrance A at 8:15 A.M., then check in with the Admitting office. Any questions or running late day of surgery: call [phone]   Questions prior to your surgery date: call [phone], Monday-Friday, 8am-4pm. If you experience any cold or flu symptoms such as cough, fever, chills, shortness of breath, etc. between now and your scheduled surgery, please notify us  at the above number.            Remember:       Do not eat or drink after midnight the night before your surgery          Take these medicines the morning of surgery with A SIP OF WATER: fluticasone  (FLONASE ) nasal spray  methadone  (DOLOPHINE )  methimazole  (TAPAZOLE )  metoprolol  succinate (TOPROL -XL)  pantoprazole  (PROTONIX )  venlafaxine  XR (EFFEXOR -XR)      May take these medicines IF NEEDED: albuterol  (VENTOLIN  HFA) inhaler - please bring inhaler with you morning of surgery HYDROcodone -acetaminophen  (NORCO/VICODIN)  meclizine  (ANTIVERT )  nitroGLYCERIN  (NITROSTAT ) - if dose taken prior to surgery, please call [phone]     Follow your surgeon's instructions on when to stop Aspirin  and clopidogrel  (PLAVIX ).  If no instructions were given by your surgeon then you will need to call the office to get those instructions.       One week prior to surgery, STOP taking any Aleve , Naproxen , Ibuprofen , Motrin , Advil , Goody's, BC's, all herbal medications, fish oil, and non-prescription vitamins.                     Do NOT Smoke (Tobacco/Vaping) for 24 hours prior to your procedure.   If you use a CPAP at night, you may bring your mask/headgear for your overnight stay.   You will be asked to remove any contacts, glasses, piercing's, hearing aid's, dentures/partials prior to surgery. Please bring cases for these items if needed.    Patients discharged the day of surgery will not be allowed to drive home, and someone  needs to stay with them for 24 hours.   SURGICAL WAITING ROOM VISITATION Patients may have no more than 2 support people in the waiting area - these visitors may rotate.   Pre-op nurse will coordinate an appropriate time for 1 ADULT support person, who may not rotate, to accompany patient in pre-op.  Children under the age of 55 must have an adult with them who is not the patient and must remain in the main waiting area with an adult.   If the patient needs to stay at the hospital during part of their recovery, the visitor guidelines for inpatient rooms apply.   Please refer to the [facility] website for the visitor guidelines for any additional information.     If you received a COVID test during your pre-op visit  it is requested that you wear a mask when out in public, stay away from anyone that may not be feeling well and notify your surgeon if you develop symptoms. If you have been in contact with anyone that has tested positive in the last 10 days please notify you surgeon.         Pre-operative 4 CHG Bathing Instructions    You can play a key role in reducing the risk of infection after surgery. Your skin needs to be as free of germs as possible. You can reduce the number of germs on your skin by washing  with CHG (chlorhexidine  gluconate) soap before surgery. CHG is an antiseptic soap that kills germs and continues to kill germs even after washing.    DO NOT use if you have an allergy to chlorhexidine /CHG or antibacterial soaps. If your skin becomes reddened or irritated, stop using the CHG and notify one of our RNs at [phone].    Please shower with the CHG soap starting 4 days before surgery using the following schedule:       Please keep in mind the following:  DO NOT shave, including legs and underarms, starting the day of your first shower.   You may shave your face at any point before/day of surgery.  Place clean sheets on your bed the day you start using CHG  soap. Use a clean washcloth (not used since being washed) for each shower. DO NOT sleep with pets once you start using the CHG.    CHG Shower Instructions:  Wash your face and private area with normal soap. If you choose to wash your hair, wash first with your normal shampoo.  After you use shampoo/soap, rinse your hair and body thoroughly to remove shampoo/soap residue.  Turn the water OFF and apply  bottle of CHG soap to a CLEAN washcloth.  Apply CHG soap ONLY FROM YOUR NECK DOWN TO YOUR TOES (washing for 3-5 minutes)  DO NOT use CHG soap on face, private areas, open wounds, or sores.  Pay special attention to the area where your surgery is being performed.  If you are having back surgery, having someone wash your back for you may be helpful. Wait 2 minutes after CHG soap is applied, then you may rinse off the CHG soap.  Pat dry with a clean towel  Put on clean clothes/pajamas   If you choose to wear lotion, please use ONLY the CHG-compatible lotions that are listed below.   Additional instructions for the day of surgery:   If you choose, you may shower the morning of surgery with an antibacterial soap.  DO NOT APPLY any lotions, deodorants, cologne, or perfumes.   Do not bring valuables to the hospital. [facility] is not responsible for any belongings/valuables. Do not wear nail polish, gel polish, artificial nails, or any other type of covering on natural nails (fingers and toes) Do not wear jewelry or makeup Put on clean/comfortable clothes.  Please brush your teeth.  Ask your nurse before applying any prescription medications to the skin.        CHG Compatible Lotions    Aveeno Moisturizing lotion  Cetaphil Moisturizing Cream  Cetaphil Moisturizing Lotion  Clairol Herbal Essence Moisturizing Lotion, Dry Skin  Clairol Herbal Essence Moisturizing Lotion, Extra Dry Skin  Clairol Herbal Essence Moisturizing Lotion, Normal Skin  Curel Age Defying Therapeutic Moisturizing  Lotion with Alpha Hydroxy  Curel Extreme Care Body Lotion  Curel Soothing Hands Moisturizing Hand Lotion  Curel Therapeutic Moisturizing Cream, Fragrance-Free  Curel Therapeutic Moisturizing Lotion, Fragrance-Free  Curel Therapeutic Moisturizing Lotion, Original Formula  Eucerin Daily Replenishing Lotion  Eucerin Dry Skin Therapy Plus Alpha Hydroxy Crme  Eucerin Dry Skin Therapy Plus Alpha Hydroxy Lotion  Eucerin Original Crme  Eucerin Original Lotion  Eucerin Plus Crme Eucerin Plus Lotion  Eucerin TriLipid Replenishing Lotion  Keri Anti-Bacterial Hand Lotion  Keri Deep Conditioning Original Lotion Dry Skin Formula Softly Scented  Keri Deep Conditioning Original Lotion, Fragrance Free Sensitive Skin Formula  Keri Lotion Fast Absorbing Fragrance Free Sensitive Skin Formula  Keri Lotion Fast Absorbing Softly Scented Dry  Skin Formula  Keri Original Lotion  Keri Skin Renewal Lotion Keri Silky Smooth Lotion  Keri Silky Smooth Sensitive Skin Lotion  Nivea Body Creamy Conditioning Oil  Nivea Body Extra Enriched Lotion  Nivea Body Original Lotion  Nivea Body Sheer Moisturizing Lotion Nivea Crme  Nivea Skin Firming Lotion  NutraDerm 30 Skin Lotion  NutraDerm Skin Lotion  NutraDerm Therapeutic Skin Cream  NutraDerm Therapeutic Skin Lotion  ProShield Protective Hand Cream  Provon moisturizing lotion   Please read over the following fact sheets that you were given.

## 2024-02-16 NOTE — Progress Notes (Signed)
 Anesthesia APP PAT Evaluation:  Case: [id number] Date/Time: 02/19/24 1004   Procedure: ANTERIOR CERVICAL DECOMPRESSION/DISCECTOMY FUSION 1 LEVEL - ACDF C45   Anesthesia type: General   Diagnosis: Radiculopathy, cervical region [M54.12]   Pre-op diagnosis: RADICULOPATHY, CERVICAL REGION   [facility] / [facility]   Surgeons: Colon Shove, MD       DISCUSSION: Patient is a 69 year old female scheduled for the above procedure. Surgery was rescheduled from 09/15/2023 due to shingles and then 11/25/2023 due to open wounds with erythema along her right upper scapular region (see below).   History includes smoking (1 PPD), post-operative N/V, COPD, CAD (s/p DES x2 mid CX into OM1 10/07/2019), murmur (trivial MR/AR 2023), HLD, asthma, GERD, interstitial cystis, IBS, fibromyalgia, chronic back pain, anxiety, right distal radius fracture (s/p ORIF 09/12/2023), spinal surgery (L3 kyphoplasty 03/29/2021), left thyroid  nodule (s/p FNA 01/08/2023: Benign follicular nodule, Bethesda category II). RA is listed, although she says the diagnosis is unclear--she said Rheumatoid factor was negative (< 14) on 03/14/2017, but had elevated ANA and ESR, and a low positive lupus test several years ago (ANA Titer 1:160 H, ANA Pattern 1 homogeneous which can be associated with SLE, drug induced lupus and juvenile idiopathic arthritis. She is not followed by rheumatology.   She was treated for shingles on 08/26/2023 (prednisone , valacyclovir ). ACDF was scheduled for 09/15/2023, but she reported lesions still draining as on 09/08/2023, so ACDF was delayed. On 11/21/2023, she came in to PAT and was noted to have open wounds on her right posterior neck and upper back/scapular region. She reported that ever since she had shingles the area had continued to bother her, so she picked and scratched at the area using her fingers and tweezers. The area at that time was quite red/inflamed with excoriations, presumably from trauma of using her  nails and tweezers to scratch the area and in the process removing outer skin layers. There were no blisters or crusting blisters. In my opinion then, open wounds appeared secondary to repeated trauma from picking at that area rather than from active shingles. There was concern about the closeness of her wounds in relation to planned surgical site, so pictures were sent to Dr. Colon who ultimately decided to postpone surgery. In the interim, I had reached out to Warren Rebel, RN with Infection Prevention. Based on my description and the time frame from her initial diagnosis, the team did not think she would need specific isolation for herpes zoster since it was not felt to be active. If any concerns on the day of surgery, staff could contact infection prevention for further input.   Since then she completed a course of Keflex .  She has tried to be better at not scratching the area, but admits to being more aggressive in the last couple of days.  The area, while improved from October does have two areas (one linear over the upper-mid scapular region and the other more circular ~ 1 cm over the upper scapular/lower posterior neck region). Her fiance has been dressing with a clean bandage. The wounds did not appear infected at this point. There was scant serosanguinous drainage on the dressing. She denied fever. WBC normal at 9.0. Dr. Colon again reviewed pictures. He plans to follow-up with patient prior to surgery and will decide whether to proceed or postpone again.    She had preoperative cardiology telephonic evaluation by Emelia Hazy, NP on 11/21/2023.  Given past medical history and time since last visit, based  on ACC/AHA guidelines, Laurie  D [name] would be at acceptable risk for the planned procedure without further cardiovascular testing.    Her RCRI is low risk, 0.9% risk of major cardiac event.  She is able to complete greater than 4 METS of physical activity.   Patient was advised that if  she develops new symptoms prior to surgery to contact our office to arrange a follow-up appointment.  She verbalized understanding.   Her aspirin  may be held for 7 days prior to her procedure if necessary. Her Plavix  may be held for 5 days prior to her procedure. Please resume aspirin  and Plavix  as soon as hemostasis is achieved.   She denied new CV symptoms since her last cardiology evaluation. She did have some pedal edema, but reported improvement with Lasix  (takes as needed). She has known chronic DOE, which she feels is exacerbated by her pain (she tends to hunch over when walking due to pain). She is suppose to use a walker due to frequent falls, but admits she is not always compliant. She has not had to use nitroglycerin . She tends to keep her neck flexed forward due to her c-spine issues. She has been off Plavix  for a few months now due to frequent falls. She reported holding ASA now for > 1 week.  She did report some chronic sinus drainage. She had a sore throat > 2 weeks ago, but otherwise has not felt sick. No fever or new cough. She did have several upper teeth and one lower tooth extracted about a week ago and feels her sinus pressure is a little worse. No coughing, runny nose, hoarseness noted at PAT. We did discuss letting Dr. Thayer office know if she developed acute URI symptoms between now and surgery. I also updated Dr. Colon.   If case remains as scheduled, then anesthesia team to evaluate on the day of surgery. As above, she keeps her neck in a flexed forward position--she says due to her neck issues. It does have some mobility, and she can mostly get her neck in a neutral position, but did not attempt extension. Discussed anesthesia team to further assess airway and can determine if advanced airway equipment will be utilized. For previous kyphoplasty in 2023, Mac and 3 were used.    VS: BP (!) 164/77   Pulse 70   Temp 36.7 C   Resp 17   Ht 5' 11 (1.803 m)   Wt 64.9 kg    SpO2 99%   BMI 19.94 kg/m  Provider wore a face mask. Patient was alert, cooperative. No acute distress. Heart RRR. I/VI SEM. Lungs clear. Trace ankle edema. Upper back wounds as discussed above.    PROVIDERS: Merna Huxley, NP is PCP  Verlin Bruckner, MD is cardiologist Dartha Ernst, MD is endocrinologist [facility] (on [address] manages her methadone    LABS: Labs reviewed: Acceptable for surgery. (all labs ordered are listed, but only abnormal results are displayed)  Labs Reviewed  BASIC METABOLIC PANEL WITH GFR - Abnormal; Notable for the following components:      Result Value   Glucose, Bld 106 (*)    BUN 24 (*)    Creatinine, Ser 1.08 (*)    GFR, Estimated 55 (*)    All other components within normal limits  CBC - Abnormal; Notable for the following components:   Hemoglobin 11.7 (*)    All other components within normal limits  SURGICAL PCR SCREEN  TYPE AND SCREEN    IMAGES: MRI C-spine 05/30/2023:  IMPRESSION: 1. Multilevel cervical spondylosis, most pronounced at C4-5 where there is moderate canal stenosis and up to mild bilateral foraminal stenosis. 2. Reactive marrow edema associated with the left C4-5 facet joint, which can be a focal source of pain.   MRI T-spine 05/30/2023: IMPRESSION: 1. Mild T9 compression fracture with mild marrow edema, possibly late subacute. 2. Mild thoracic disc and facet degeneration without stenosis.  MRI L-spine 05/30/2023: IMPRESSION: 1. Mild multilevel degenerative changes of the lumbar spine. No significant canal stenosis at any level. 2. Mild multilevel foraminal and subarticular recess stenosis. 3. Chronic superior endplate compression deformity of L3 status post cement augmentation.   EKG: 05/12/2023: Sinus rhythm Nonspecific intraventricular conduction delay Confirmed by Trine Likes [phone]) on 05/13/2023 3:15:22 AM   CV: Cardiac cath 06/20/2022:   Prox LAD lesion is 40% stenosed.   Mid RCA lesion is  50% stenosed.   Previously placed Prox Cx to Mid Cx stent of unknown type is  widely patent.   There is hyperdynamic left ventricular systolic function.   LV end diastolic pressure is mildly elevated.   The left ventricular ejection fraction is greater than 65% by visual estimate.   Mild non-obstructive disease in the proximal to mid LAD. No change since last cath Patent mid Circumflex stents extending into the obtuse marginal branch with no evidence of restenosis.  Moderate non-obstructive disease in the mid RCA, unchanged from last cath Mild elevation LV filling pressure   Recommendations: Continue medical management of CAD. I will have her take Lasix  20 mg daily for the next week and see if this helps with her dyspnea.    Echo 10/08/2021: IMPRESSIONS   1. Left ventricular ejection fraction, by estimation, is 60 to 65%. The  left ventricle has normal function. The left ventricle has no regional  wall motion abnormalities. There is mild left ventricular hypertrophy.  Left ventricular diastolic parameters  were normal.   2. Right ventricular systolic function is normal. The right ventricular  size is normal. There is normal pulmonary artery systolic pressure. The  estimated right ventricular systolic pressure is 19.5 mmHg.   3. The mitral valve is normal in structure. Trivial mitral valve  regurgitation. No evidence of mitral stenosis.   4. The aortic valve is tricuspid. Aortic valve regurgitation is trivial.  No aortic stenosis is present.   5. The inferior vena cava is normal in size with greater than 50%  respiratory variability, suggesting right atrial pressure of 3 mmHg.    US  Carotid 10/18/2021: Summary:  - Right Carotid: Velocities in the right ICA are consistent with a 1-39% stenosis. Non-hemodynamically significant plaque <50% noted in the CCA.  - Left Carotid: Velocities in the left ICA are consistent with a 1-39% stenosis.  - Vertebrals: Bilateral vertebral arteries  demonstrate antegrade flow.  - Subclavians: Right subclavian artery flow was disturbed. Normal flow hemodynamics were seen in the left subclavian artery.    Past Medical History:  Diagnosis Date   Allergy    Anxiety    Arthritis    RA   Asthma    Chronic back pain    COPD (chronic obstructive pulmonary disease) (HCC)    Coronary artery disease    Depression    Esophageal stricture    GERD (gastroesophageal reflux disease)    Heart murmur    Hematemesis 09/10/2018   Hyperlipidemia    Hypertension    IBS (irritable bowel syndrome)    Interstitial cystitis    Neuromuscular disorder (HCC)  fibromyalgia   PONV (postoperative nausea and vomiting)     Past Surgical History:  Procedure Laterality Date   ABDOMINAL HYSTERECTOMY     APPENDECTOMY     BIOPSY  09/11/2018   Procedure: BIOPSY;  Surgeon: Wilhelmenia Aloha Raddle., MD;  [facility] ENDOSCOPY;  Service: Gastroenterology;;   CERVICAL FUSION     CERVICAL LAMINECTOMY     CORONARY STENT INTERVENTION N/A 10/07/2019   Procedure: CORONARY STENT INTERVENTION;  Surgeon: Verlin Lonni BIRCH, MD;  [facility]: [facility] INVASIVE CV LAB;  Service: Cardiovascular;  Laterality: N/A;   ESOPHAGOGASTRODUODENOSCOPY     ESOPHAGOGASTRODUODENOSCOPY (EGD) WITH PROPOFOL  N/A 09/11/2018   Procedure: ESOPHAGOGASTRODUODENOSCOPY (EGD) WITH PROPOFOL ;  Surgeon: Wilhelmenia Aloha Raddle., MD;  Location: [facility] ENDOSCOPY;  Service: Gastroenterology;  Laterality: N/A;   FINGER SURGERY     KYPHOPLASTY N/A 03/29/2021   Procedure: Lumbar Three KYPHOPLASTY;  Surgeon: Colon Shove, MD;  Location: [facility] OR;  Service: Neurosurgery;  Laterality: N/A;   LEFT HEART CATH AND CORONARY ANGIOGRAPHY N/A 10/07/2019   Procedure: LEFT HEART CATH AND CORONARY ANGIOGRAPHY;  Surgeon: Verlin Lonni BIRCH, MD;  Location: [facility] INVASIVE CV LAB;  Service: Cardiovascular;  Laterality: N/A;   LEFT HEART CATH AND CORONARY ANGIOGRAPHY N/A 06/20/2022   Procedure: LEFT HEART CATH AND CORONARY  ANGIOGRAPHY;  Surgeon: Verlin Lonni BIRCH, MD;  Location: [facility] INVASIVE CV LAB;  Service: Cardiovascular;  Laterality: N/A;   LUMBAR FUSION     SAVORY DILATION N/A 09/11/2018   Procedure: SAVORY DILATION;  Surgeon: Wilhelmenia Aloha Raddle., MD;  Location: [facility] ENDOSCOPY;  Service: Gastroenterology;  Laterality: N/A;   TONSILLECTOMY      MEDICATIONS:  albuterol  (VENTOLIN  HFA) 108 (90 Base) MCG/ACT inhaler   aspirin  EC 81 MG tablet   atorvastatin  (LIPITOR) 40 MG tablet   cephALEXin  (KEFLEX ) 500 MG capsule   clopidogrel  (PLAVIX ) 75 MG tablet   fenofibrate  (TRICOR ) 145 MG tablet   fluticasone  (FLONASE ) 50 MCG/ACT nasal spray   furosemide  (LASIX ) 20 MG tablet   HYDROcodone -acetaminophen  (NORCO/VICODIN) 5-325 MG tablet   meclizine  (ANTIVERT ) 25 MG tablet   methadone  (DOLOPHINE ) 10 MG/ML solution   methimazole  (TAPAZOLE ) 10 MG tablet   metoprolol  succinate (TOPROL -XL) 50 MG 24 hr tablet   naproxen  (NAPROSYN ) 500 MG tablet   nitroGLYCERIN  (NITROSTAT ) 0.4 MG SL tablet   olmesartan  (BENICAR ) 40 MG tablet   pantoprazole  (PROTONIX ) 40 MG tablet   spironolactone  (ALDACTONE ) 25 MG tablet   venlafaxine  XR (EFFEXOR -XR) 150 MG 24 hr capsule   venlafaxine  XR (EFFEXOR -XR) 75 MG 24 hr capsule   Vitamin D , Ergocalciferol , (DRISDOL ) 1.25 MG (50000 UNIT) CAPS capsule   No current facility-administered medications for this encounter.    Isaiah Ruder, PA-C Surgical Short Stay/Anesthesiology [facility] Phone [phone] [facility] Phone [phone] 02/16/2024 7:24 PM

## 2024-02-16 NOTE — Anesthesia Preprocedure Evaluation (Addendum)
 "                                  Anesthesia Evaluation  Patient identified by MRN, date of birth, ID band Patient awake    Reviewed: Allergy & Precautions, NPO status , Patient's Chart, lab work & pertinent test results, reviewed documented beta blocker date and time   History of Anesthesia Complications (+) PONV and history of anesthetic complications  Airway Mallampati: III  TM Distance: >3 FB Neck ROM: Limited    Dental  (+) Edentulous Upper, Poor Dentition, Missing   Pulmonary shortness of breath and with exertion, asthma , COPD, Current Smoker and Patient abstained from smoking.   breath sounds clear to auscultation       Cardiovascular Exercise Tolerance: Poor hypertension, + angina with exertion + CAD  (-) Past MI, (-) Cardiac Stents and (-) CABG + Valvular Problems/Murmurs  Rhythm:Regular Rate:Normal    Prox LAD lesion is 40% stenosed.   Mid RCA lesion is 50% stenosed.   Previously placed Prox Cx to Mid Cx stent of unknown type is  widely patent.   There is hyperdynamic left ventricular systolic function.   LV end diastolic pressure is mildly elevated.   The left ventricular ejection fraction is greater than 65% by visual estimate.     1. Left ventricular ejection fraction, by estimation, is 60 to 65%. The  left ventricle has normal function. The left ventricle has no regional  wall motion abnormalities. There is mild left ventricular hypertrophy.  Left ventricular diastolic parameters  were normal.   2. Right ventricular systolic function is normal. The right ventricular  size is normal. There is normal pulmonary artery systolic pressure. The  estimated right ventricular systolic pressure is 19.5 mmHg.   3. The mitral valve is normal in structure. Trivial mitral valve  regurgitation. No evidence of mitral stenosis.   4. The aortic valve is tricuspid. Aortic valve regurgitation is trivial.  No aortic stenosis is present.   5. The inferior vena  cava is normal in size with greater than 50%  respiratory variability, suggesting right atrial pressure of 3 mmHg.      Neuro/Psych neg Seizures PSYCHIATRIC DISORDERS Anxiety Depression    IMPRESSION: 1. Multilevel cervical spondylosis, most pronounced at C4-5 where there is moderate canal stenosis and up to mild bilateral foraminal stenosis. 2. Reactive marrow edema associated with the left C4-5 facet joint, which can be a focal source of pain.   Neuromuscular disease    GI/Hepatic ,GERD  Medicated and Controlled,,  Endo/Other   Hyperthyroidism   Renal/GU CRFRenal disease     Musculoskeletal  (+) Arthritis , Osteoarthritis,  Fibromyalgia -  Abdominal   Peds  Hematology  (+) Blood dyscrasia, anemia   Anesthesia Other Findings   Reproductive/Obstetrics                              Anesthesia Physical Anesthesia Plan  ASA: 3  Anesthesia Plan: General   Post-op Pain Management:    Induction: Intravenous  PONV Risk Score and Plan: 3 and Propofol  infusion, Ondansetron  and Dexamethasone   Airway Management Planned: Oral ETT and Video Laryngoscope Planned  Additional Equipment:   Intra-op Plan:   Post-operative Plan: Extubation in OR  Informed Consent: I have reviewed the patients History and Physical, chart, labs and discussed the procedure including the risks, benefits and alternatives  for the proposed anesthesia with the patient or authorized representative who has indicated his/her understanding and acceptance.     Dental advisory given  Plan Discussed with: CRNA  Anesthesia Plan Comments: (See PAT note written 02/16/2024 by Allison Zelenak, PA-C.  )         Anesthesia Quick Evaluation  "

## 2024-02-19 ENCOUNTER — Ambulatory Visit (HOSPITAL_COMMUNITY): Admitting: Anesthesiology

## 2024-02-19 ENCOUNTER — Ambulatory Visit (HOSPITAL_COMMUNITY)

## 2024-02-19 ENCOUNTER — Encounter (HOSPITAL_COMMUNITY)
Admission: RE | Disposition: A | Discharge status: Home / Self Care | Payer: Self-pay | Source: Home / Self Care | Attending: Neurological Surgery

## 2024-02-19 ENCOUNTER — Observation Stay (HOSPITAL_COMMUNITY)
Admission: RE | Admit: 2024-02-19 | Discharge: 2024-02-20 | Disposition: A | Discharge status: Home / Self Care | Attending: Neurological Surgery | Admitting: Neurological Surgery

## 2024-02-19 ENCOUNTER — Ambulatory Visit (HOSPITAL_COMMUNITY): Payer: Self-pay | Admitting: Vascular Surgery

## 2024-02-19 DIAGNOSIS — I25119 Atherosclerotic heart disease of native coronary artery with unspecified angina pectoris: Secondary | ICD-10-CM | POA: Insufficient documentation

## 2024-02-19 DIAGNOSIS — F17210 Nicotine dependence, cigarettes, uncomplicated: Secondary | ICD-10-CM | POA: Insufficient documentation

## 2024-02-19 DIAGNOSIS — J4489 Other specified chronic obstructive pulmonary disease: Secondary | ICD-10-CM | POA: Diagnosis not present

## 2024-02-19 DIAGNOSIS — I10 Essential (primary) hypertension: Secondary | ICD-10-CM | POA: Diagnosis not present

## 2024-02-19 DIAGNOSIS — M542 Cervicalgia: Secondary | ICD-10-CM | POA: Diagnosis present

## 2024-02-19 DIAGNOSIS — Z79899 Other long term (current) drug therapy: Secondary | ICD-10-CM | POA: Diagnosis not present

## 2024-02-19 DIAGNOSIS — M4712 Other spondylosis with myelopathy, cervical region: Secondary | ICD-10-CM | POA: Diagnosis not present

## 2024-02-19 DIAGNOSIS — Z981 Arthrodesis status: Secondary | ICD-10-CM | POA: Insufficient documentation

## 2024-02-19 DIAGNOSIS — M4802 Spinal stenosis, cervical region: Secondary | ICD-10-CM | POA: Insufficient documentation

## 2024-02-19 DIAGNOSIS — M4722 Other spondylosis with radiculopathy, cervical region: Secondary | ICD-10-CM | POA: Insufficient documentation

## 2024-02-19 DIAGNOSIS — M4726 Other spondylosis with radiculopathy, lumbar region: Secondary | ICD-10-CM

## 2024-02-19 DIAGNOSIS — I25110 Atherosclerotic heart disease of native coronary artery with unstable angina pectoris: Secondary | ICD-10-CM

## 2024-02-19 HISTORY — PX: ANTERIOR CERVICAL DECOMP/DISCECTOMY FUSION: SHX1161

## 2024-02-19 SURGERY — ANTERIOR CERVICAL DECOMPRESSION/DISCECTOMY FUSION 1 LEVEL
Anesthesia: General

## 2024-02-19 MED ORDER — SODIUM CHLORIDE 0.9% FLUSH: 3.0000 mL | INTRAVENOUS | Status: DC | PRN

## 2024-02-19 MED ORDER — CEFAZOLIN SODIUM-DEXTROSE 2-4 GM/100ML-% IV SOLN
2.0000 g | Freq: Three times a day (TID) | INTRAVENOUS | Status: AC
  Administered 2024-02-19 – 2024-02-20 (×2): 2 g via INTRAVENOUS
  Filled 2024-02-19 (×2): qty 100

## 2024-02-19 MED ORDER — ROCURONIUM BROMIDE 10 MG/ML (PF) SYRINGE
PREFILLED_SYRINGE | INTRAVENOUS | Status: AC
  Filled 2024-02-19: qty 10

## 2024-02-19 MED ORDER — MIDAZOLAM HCL (PF) 2 MG/2ML IJ SOLN
INTRAMUSCULAR | Status: DC | PRN
  Administered 2024-02-19: 2 mg via INTRAVENOUS

## 2024-02-19 MED ORDER — ORAL CARE MOUTH RINSE: 15.0000 mL | Freq: Once | OROMUCOSAL | Status: AC

## 2024-02-19 MED ORDER — ALBUTEROL SULFATE HFA 108 (90 BASE) MCG/ACT IN AERS: 2.0000 | INHALATION_SPRAY | RESPIRATORY_TRACT | Status: DC | PRN

## 2024-02-19 MED ORDER — METHOCARBAMOL 500 MG PO TABS
500.0000 mg | ORAL_TABLET | Freq: Four times a day (QID) | ORAL | Status: DC | PRN
  Administered 2024-02-19 – 2024-02-20 (×2): 500 mg via ORAL
  Filled 2024-02-19 (×2): qty 1

## 2024-02-19 MED ORDER — CEFAZOLIN SODIUM-DEXTROSE 2-4 GM/100ML-% IV SOLN
2.0000 g | INTRAVENOUS | Status: AC
  Administered 2024-02-19: 2 g via INTRAVENOUS
  Filled 2024-02-19: qty 100

## 2024-02-19 MED ORDER — OXYCODONE HCL 5 MG PO TABS
ORAL_TABLET | ORAL | Status: AC
  Filled 2024-02-19: qty 1

## 2024-02-19 MED ORDER — BISACODYL 10 MG RE SUPP: 10.0000 mg | Freq: Every day | RECTAL | Status: DC | PRN

## 2024-02-19 MED ORDER — FLUTICASONE PROPIONATE 50 MCG/ACT NA SUSP
2.0000 | Freq: Every day | NASAL | Status: DC
  Administered 2024-02-19: 2 via NASAL
  Filled 2024-02-19: qty 16

## 2024-02-19 MED ORDER — ACETAMINOPHEN 325 MG PO TABS: 650.0000 mg | ORAL_TABLET | ORAL | Status: DC | PRN

## 2024-02-19 MED ORDER — SPIRONOLACTONE 25 MG PO TABS
25.0000 mg | ORAL_TABLET | Freq: Every day | ORAL | Status: DC
  Filled 2024-02-19: qty 1

## 2024-02-19 MED ORDER — FLEET ENEMA RE ENEM: 1.0000 | ENEMA | Freq: Once | RECTAL | Status: DC | PRN

## 2024-02-19 MED ORDER — HYDROMORPHONE HCL 1 MG/ML IJ SOLN
INTRAMUSCULAR | Status: AC
  Filled 2024-02-19: qty 1

## 2024-02-19 MED ORDER — ONDANSETRON HCL 4 MG/2ML IJ SOLN
INTRAMUSCULAR | Status: DC | PRN
  Administered 2024-02-19: 4 mg via INTRAVENOUS

## 2024-02-19 MED ORDER — METOPROLOL SUCCINATE ER 50 MG PO TB24
50.0000 mg | ORAL_TABLET | Freq: Every day | ORAL | Status: DC
  Administered 2024-02-20: 50 mg via ORAL
  Filled 2024-02-19: qty 1

## 2024-02-19 MED ORDER — ONDANSETRON HCL 4 MG/2ML IJ SOLN
INTRAMUSCULAR | Status: AC
  Filled 2024-02-19: qty 2

## 2024-02-19 MED ORDER — PHENYLEPHRINE HCL-NACL 20-0.9 MG/250ML-% IV SOLN
INTRAVENOUS | Status: DC | PRN
  Administered 2024-02-19: 20 ug/min via INTRAVENOUS

## 2024-02-19 MED ORDER — FUROSEMIDE 20 MG PO TABS: 20.0000 mg | ORAL_TABLET | Freq: Every day | ORAL | Status: DC | PRN

## 2024-02-19 MED ORDER — DOCUSATE SODIUM 100 MG PO CAPS
100.0000 mg | ORAL_CAPSULE | Freq: Two times a day (BID) | ORAL | Status: DC
  Administered 2024-02-19 – 2024-02-20 (×3): 100 mg via ORAL
  Filled 2024-02-19 (×3): qty 1

## 2024-02-19 MED ORDER — BUPIVACAINE HCL (PF) 0.5 % IJ SOLN
INTRAMUSCULAR | Status: DC | PRN
  Administered 2024-02-19: 4 mL

## 2024-02-19 MED ORDER — MELATONIN 5 MG PO TABS: 5.0000 mg | ORAL_TABLET | Freq: Once | ORAL | Status: DC | PRN

## 2024-02-19 MED ORDER — METHIMAZOLE 10 MG PO TABS
10.0000 mg | ORAL_TABLET | Freq: Two times a day (BID) | ORAL | Status: DC
  Administered 2024-02-19 – 2024-02-20 (×2): 10 mg via ORAL
  Filled 2024-02-19 (×3): qty 1

## 2024-02-19 MED ORDER — LIDOCAINE 2% (20 MG/ML) 5 ML SYRINGE
INTRAMUSCULAR | Status: DC | PRN
  Administered 2024-02-19: 100 mg via INTRAVENOUS

## 2024-02-19 MED ORDER — PROPOFOL 10 MG/ML IV BOLUS
INTRAVENOUS | Status: DC | PRN
  Administered 2024-02-19: 70 mg via INTRAVENOUS

## 2024-02-19 MED ORDER — MECLIZINE HCL 25 MG PO TABS: 25.0000 mg | ORAL_TABLET | Freq: Three times a day (TID) | ORAL | Status: DC | PRN

## 2024-02-19 MED ORDER — SUGAMMADEX SODIUM 200 MG/2ML IV SOLN
INTRAVENOUS | Status: DC | PRN
  Administered 2024-02-19: 129.8 mg via INTRAVENOUS

## 2024-02-19 MED ORDER — HYDROCODONE-ACETAMINOPHEN 5-325 MG PO TABS
1.0000 | ORAL_TABLET | ORAL | Status: DC | PRN
  Filled 2024-02-19: qty 1

## 2024-02-19 MED ORDER — NITROGLYCERIN 0.4 MG SL SUBL: 0.4000 mg | SUBLINGUAL_TABLET | SUBLINGUAL | Status: DC | PRN

## 2024-02-19 MED ORDER — LIDOCAINE-EPINEPHRINE 1 %-1:100000 IJ SOLN
INTRAMUSCULAR | Status: DC | PRN
  Administered 2024-02-19: 4 mL

## 2024-02-19 MED ORDER — SENNA 8.6 MG PO TABS
1.0000 | ORAL_TABLET | Freq: Two times a day (BID) | ORAL | Status: DC
  Administered 2024-02-19 – 2024-02-20 (×3): 8.6 mg via ORAL
  Filled 2024-02-19 (×3): qty 1

## 2024-02-19 MED ORDER — BUPIVACAINE HCL (PF) 0.5 % IJ SOLN
INTRAMUSCULAR | Status: AC
  Filled 2024-02-19: qty 30

## 2024-02-19 MED ORDER — LIDOCAINE 2% (20 MG/ML) 5 ML SYRINGE
INTRAMUSCULAR | Status: AC
  Filled 2024-02-19: qty 5

## 2024-02-19 MED ORDER — CHLORHEXIDINE GLUCONATE 0.12 % MT SOLN
15.0000 mL | Freq: Once | OROMUCOSAL | Status: AC
  Administered 2024-02-19: 15 mL via OROMUCOSAL
  Filled 2024-02-19: qty 15

## 2024-02-19 MED ORDER — IRBESARTAN 150 MG PO TABS
300.0000 mg | ORAL_TABLET | Freq: Every day | ORAL | Status: DC
  Administered 2024-02-20: 300 mg via ORAL
  Filled 2024-02-19: qty 2

## 2024-02-19 MED ORDER — GLYCOPYRROLATE 0.2 MG/ML IJ SOLN
INTRAMUSCULAR | Status: DC | PRN
  Administered 2024-02-19: .2 mg via INTRAVENOUS

## 2024-02-19 MED ORDER — SODIUM CHLORIDE 0.9 % IV SOLN
250.0000 mL | INTRAVENOUS | Status: AC
  Administered 2024-02-19: 250 mL via INTRAVENOUS

## 2024-02-19 MED ORDER — ATORVASTATIN CALCIUM 40 MG PO TABS
40.0000 mg | ORAL_TABLET | Freq: Every day | ORAL | Status: DC
  Administered 2024-02-20: 40 mg via ORAL
  Filled 2024-02-19: qty 1

## 2024-02-19 MED ORDER — PROPOFOL 500 MG/50ML IV EMUL
INTRAVENOUS | Status: DC | PRN
  Administered 2024-02-19: 55 ug/kg/min via INTRAVENOUS

## 2024-02-19 MED ORDER — VENLAFAXINE HCL ER 75 MG PO CP24
150.0000 mg | ORAL_CAPSULE | Freq: Every morning | ORAL | Status: DC
  Administered 2024-02-20: 150 mg via ORAL
  Filled 2024-02-19: qty 2

## 2024-02-19 MED ORDER — ALBUTEROL SULFATE (2.5 MG/3ML) 0.083% IN NEBU: 2.5000 mg | INHALATION_SOLUTION | RESPIRATORY_TRACT | Status: DC | PRN

## 2024-02-19 MED ORDER — ONDANSETRON HCL 4 MG/2ML IJ SOLN: 4.0000 mg | Freq: Four times a day (QID) | INTRAMUSCULAR | Status: DC | PRN

## 2024-02-19 MED ORDER — HYDROMORPHONE HCL 1 MG/ML IJ SOLN
0.2500 mg | INTRAMUSCULAR | Status: DC | PRN
  Administered 2024-02-19 (×3): 0.5 mg via INTRAVENOUS

## 2024-02-19 MED ORDER — ROCURONIUM BROMIDE 10 MG/ML (PF) SYRINGE
PREFILLED_SYRINGE | INTRAVENOUS | Status: DC | PRN
  Administered 2024-02-19: 10 mg via INTRAVENOUS
  Administered 2024-02-19: 50 mg via INTRAVENOUS

## 2024-02-19 MED ORDER — PHENOL 1.4 % MT LIQD
1.0000 | OROMUCOSAL | Status: DC | PRN
  Filled 2024-02-19: qty 177

## 2024-02-19 MED ORDER — DEXAMETHASONE SOD PHOSPHATE PF 10 MG/ML IJ SOLN
INTRAMUSCULAR | Status: DC | PRN
  Administered 2024-02-19: 10 mg via INTRAVENOUS

## 2024-02-19 MED ORDER — FENTANYL CITRATE (PF) 100 MCG/2ML IJ SOLN
INTRAMUSCULAR | Status: AC
  Filled 2024-02-19: qty 2

## 2024-02-19 MED ORDER — PANTOPRAZOLE SODIUM 40 MG PO TBEC
40.0000 mg | DELAYED_RELEASE_TABLET | Freq: Every morning | ORAL | Status: DC
  Administered 2024-02-20: 40 mg via ORAL
  Filled 2024-02-19: qty 1

## 2024-02-19 MED ORDER — METHADONE HCL 10 MG/ML PO CONC: 115.0000 mg | Freq: Every day | ORAL | Status: DC

## 2024-02-19 MED ORDER — VENLAFAXINE HCL ER 75 MG PO CP24
75.0000 mg | ORAL_CAPSULE | Freq: Every day | ORAL | Status: DC
  Administered 2024-02-20: 75 mg via ORAL
  Filled 2024-02-19: qty 1

## 2024-02-19 MED ORDER — ZOLPIDEM TARTRATE 5 MG PO TABS
5.0000 mg | ORAL_TABLET | Freq: Once | ORAL | Status: AC | PRN
  Administered 2024-02-19: 5 mg via ORAL
  Filled 2024-02-19: qty 1

## 2024-02-19 MED ORDER — FENOFIBRATE 160 MG PO TABS
160.0000 mg | ORAL_TABLET | Freq: Every day | ORAL | Status: DC
  Administered 2024-02-19 – 2024-02-20 (×2): 160 mg via ORAL
  Filled 2024-02-19 (×2): qty 1

## 2024-02-19 MED ORDER — METHADONE HCL 5 MG PO TABS
115.0000 mg | ORAL_TABLET | Freq: Every day | ORAL | Status: DC
  Filled 2024-02-19: qty 1

## 2024-02-19 MED ORDER — OXYCODONE HCL 5 MG/5ML PO SOLN: 5.0000 mg | Freq: Once | ORAL | Status: AC | PRN

## 2024-02-19 MED ORDER — THROMBIN 5000 UNITS EX SOLR: OROMUCOSAL | Status: DC | PRN

## 2024-02-19 MED ORDER — LIDOCAINE-EPINEPHRINE 1 %-1:100000 IJ SOLN
INTRAMUSCULAR | Status: AC
  Filled 2024-02-19: qty 1

## 2024-02-19 MED ORDER — POLYETHYLENE GLYCOL 3350 17 G PO PACK: 17.0000 g | PACK | Freq: Every day | ORAL | Status: DC | PRN

## 2024-02-19 MED ORDER — METHADONE HCL 10 MG PO TABS
115.0000 mg | ORAL_TABLET | Freq: Every day | ORAL | Status: DC
  Administered 2024-02-19: 115 mg via ORAL
  Filled 2024-02-19: qty 12

## 2024-02-19 MED ORDER — ALUM & MAG HYDROXIDE-SIMETH 200-200-20 MG/5ML PO SUSP: 30.0000 mL | Freq: Four times a day (QID) | ORAL | Status: DC | PRN

## 2024-02-19 MED ORDER — ACETAMINOPHEN 650 MG RE SUPP: 650.0000 mg | RECTAL | Status: DC | PRN

## 2024-02-19 MED ORDER — ONDANSETRON HCL 4 MG PO TABS: 4.0000 mg | ORAL_TABLET | Freq: Four times a day (QID) | ORAL | Status: DC | PRN

## 2024-02-19 MED ORDER — LACTATED RINGERS IV SOLN: INTRAVENOUS | Status: DC

## 2024-02-19 MED ORDER — OXYCODONE HCL 5 MG PO TABS
5.0000 mg | ORAL_TABLET | Freq: Once | ORAL | Status: AC | PRN
  Administered 2024-02-19: 5 mg via ORAL

## 2024-02-19 MED ORDER — ONDANSETRON HCL 4 MG/2ML IJ SOLN: 4.0000 mg | Freq: Once | INTRAMUSCULAR | Status: DC | PRN

## 2024-02-19 MED ORDER — CHLORHEXIDINE GLUCONATE CLOTH 2 % EX PADS
6.0000 | MEDICATED_PAD | Freq: Once | CUTANEOUS | Status: AC
  Administered 2024-02-19: 6 via TOPICAL

## 2024-02-19 MED ORDER — ACETAMINOPHEN 10 MG/ML IV SOLN: 1000.0000 mg | Freq: Once | INTRAVENOUS | Status: DC | PRN

## 2024-02-19 MED ORDER — THROMBIN 5000 UNITS EX KIT
PACK | CUTANEOUS | Status: AC
  Filled 2024-02-19: qty 1

## 2024-02-19 MED ORDER — FENTANYL CITRATE (PF) 100 MCG/2ML IJ SOLN: 25.0000 ug | INTRAMUSCULAR | Status: DC | PRN

## 2024-02-19 MED ORDER — HYDROMORPHONE HCL 2 MG PO TABS
2.0000 mg | ORAL_TABLET | ORAL | Status: DC | PRN
  Administered 2024-02-19 – 2024-02-20 (×5): 2 mg via ORAL
  Filled 2024-02-19 (×5): qty 1

## 2024-02-19 MED ORDER — SODIUM CHLORIDE 0.9% FLUSH
3.0000 mL | Freq: Two times a day (BID) | INTRAVENOUS | Status: DC
  Administered 2024-02-19 (×2): 3 mL via INTRAVENOUS

## 2024-02-19 MED ORDER — MENTHOL 3 MG MT LOZG: 1.0000 | LOZENGE | OROMUCOSAL | Status: DC | PRN

## 2024-02-19 MED ORDER — MIDAZOLAM HCL 2 MG/2ML IJ SOLN
INTRAMUSCULAR | Status: AC
  Filled 2024-02-19: qty 2

## 2024-02-19 MED ORDER — 0.9 % SODIUM CHLORIDE (POUR BTL) OPTIME
TOPICAL | Status: DC | PRN
  Administered 2024-02-19: 1000 mL

## 2024-02-19 MED ORDER — HYDROMORPHONE HCL 1 MG/ML IJ SOLN
0.5000 mg | INTRAMUSCULAR | Status: DC | PRN
  Administered 2024-02-20: 0.5 mg via INTRAVENOUS
  Filled 2024-02-19: qty 0.5

## 2024-02-19 MED ORDER — METHOCARBAMOL 1000 MG/10ML IJ SOLN: 500.0000 mg | Freq: Four times a day (QID) | INTRAMUSCULAR | Status: DC | PRN

## 2024-02-19 MED ORDER — FENTANYL CITRATE (PF) 250 MCG/5ML IJ SOLN
INTRAMUSCULAR | Status: DC | PRN
  Administered 2024-02-19: 25 ug via INTRAVENOUS
  Administered 2024-02-19: 75 ug via INTRAVENOUS

## 2024-02-19 MED ORDER — CHLORHEXIDINE GLUCONATE CLOTH 2 % EX PADS: 6.0000 | MEDICATED_PAD | Freq: Once | CUTANEOUS | Status: DC

## 2024-02-19 MED ORDER — GLYCOPYRROLATE PF 0.2 MG/ML IJ SOSY
PREFILLED_SYRINGE | INTRAMUSCULAR | Status: AC
  Filled 2024-02-19: qty 1

## 2024-02-19 SURGICAL SUPPLY — 41 items
ALLOGRFT BNE OSSIFUSE FBR 1CC (Bone Implant) IMPLANT
BAG COUNTER SPONGE SURGICOUNT (BAG) ×1 IMPLANT
BAND RUBBER 3X1/6 STRL (MISCELLANEOUS) ×2 IMPLANT
BIT DRILL ACP 13 (DRILL) IMPLANT
BIT DRILL NEURO 2X3.1 SFT TUCH (MISCELLANEOUS) ×1 IMPLANT
BNDG GAUZE DERMACEA FLUFF 4 (GAUZE/BANDAGES/DRESSINGS) IMPLANT
BUR BARREL STRAIGHT FLUTE 4.0 (BURR) IMPLANT
CAGE CERV MOD 5X15X12 7D (Cage) IMPLANT
CANISTER SUCTION 3000ML PPV (SUCTIONS) ×1 IMPLANT
DERMABOND ADVANCED .7 DNX12 (GAUZE/BANDAGES/DRESSINGS) ×1 IMPLANT
DRAPE LAPAROTOMY 100X72 PEDS (DRAPES) ×1 IMPLANT
DRAPE MICROSCOPE LEICA (MISCELLANEOUS) IMPLANT
DURAPREP 6ML APPLICATOR 50/CS (WOUND CARE) ×1 IMPLANT
ELECT COATED BLADE 2.86 ST (ELECTRODE) ×1 IMPLANT
ELECTRODE REM PT RTRN 9FT ADLT (ELECTROSURGICAL) ×1 IMPLANT
GAUZE 4X4 16PLY ~~LOC~~+RFID DBL (SPONGE) IMPLANT
GLOVE BIOGEL PI IND STRL 8.5 (GLOVE) ×1 IMPLANT
GLOVE BIOGEL PI MICRO 8.5 (GLOVE) ×1 IMPLANT
GOWN STRL REUS W/ TWL LRG LVL3 (GOWN DISPOSABLE) IMPLANT
GOWN STRL REUS W/ TWL XL LVL3 (GOWN DISPOSABLE) ×1 IMPLANT
GOWN STRL REUS W/TWL 2XL LVL3 (GOWN DISPOSABLE) ×1 IMPLANT
HALTER HD/CHIN CERV TRACTION D (MISCELLANEOUS) ×1 IMPLANT
HEMOSTAT POWDER KIT SURGIFOAM (HEMOSTASIS) ×1 IMPLANT
KIT BASIN OR (CUSTOM PROCEDURE TRAY) ×1 IMPLANT
NEEDLE HYPO 22X1.5 SAFETY MO (MISCELLANEOUS) ×1 IMPLANT
NEEDLE SPNL 22GX3.5 QUINCKE BK (NEEDLE) ×1 IMPLANT
PACK LAMINECTOMY NEURO (CUSTOM PROCEDURE TRAY) ×1 IMPLANT
PAD ARMBOARD POSITIONER FOAM (MISCELLANEOUS) ×3 IMPLANT
PATTIES SURGICAL .5 X1 (DISPOSABLE) ×1 IMPLANT
PLATE ACP 1-LEVEL 1.6V20 (Plate) IMPLANT
SCREW ACP ST VARI 3.5X13 (Screw) IMPLANT
SCREW ACP VA ST 3.5X15 (Screw) IMPLANT
SET WALTER ACTIVATION W/DRAPE (SET/KITS/TRAYS/PACK) ×1 IMPLANT
SOLN 0.9% NACL POUR BTL 1000ML (IV SOLUTION) ×1 IMPLANT
SOLN STERILE WATER BTL 1000 ML (IV SOLUTION) ×1 IMPLANT
SPIKE FLUID TRANSFER (MISCELLANEOUS) ×1 IMPLANT
SPONGE INTESTINAL PEANUT (DISPOSABLE) ×1 IMPLANT
SUT VIC AB 4-0 RB1 18 (SUTURE) ×2 IMPLANT
TOWEL GREEN STERILE (TOWEL DISPOSABLE) ×1 IMPLANT
TOWEL GREEN STERILE FF (TOWEL DISPOSABLE) ×1 IMPLANT
TUBING FEATHERFLOW (TUBING) ×1 IMPLANT

## 2024-02-19 NOTE — Anesthesia Procedure Notes (Signed)
 Procedure Name: Intubation Date/Time: 02/19/2024 10:51 AM  Performed by: Elly Pfeiffer, CRNAPre-anesthesia Checklist: Patient identified, Emergency Drugs available, Suction available and Patient being monitored Patient Re-evaluated:Patient Re-evaluated prior to induction Oxygen  Delivery Method: Circle system utilized Preoxygenation: Pre-oxygenation with 100% oxygen  Induction Type: IV induction Ventilation: Mask ventilation without difficulty and Oral airway inserted - appropriate to patient size Laryngoscope Size: Glidescope and 3 Grade View: Grade I Tube type: Oral Tube size: 7.0 mm Number of attempts: 1 Airway Equipment and Method: Stylet and Oral airway Placement Confirmation: ETT inserted through vocal cords under direct vision, positive ETCO2 and breath sounds checked- equal and bilateral Secured at: 20 cm Tube secured with: Tape Dental Injury: Teeth and Oropharynx as per pre-operative assessment  Comments: Cords clear; no trauma; neutral c-spine maintained throughout airway instrumentation. CA

## 2024-02-19 NOTE — Op Note (Signed)
 Date of surgery: 02/19/2024 Preoperative diagnosis: C4-5 spondylosis with myelopathy and cervical radiculopathy.  History of previous fusion C5-6 and arthroplasty C3-4. Postoperative diagnosis: Same Procedure: Anterior cervical decompression C4-C5 arthrodesis with structural spacer allograft and anterior plate fixation R5-R4.  Removal of previous plate from R4-R3. Surgeon: Victory Gens First Assistant: Dorn Glade, MD Anesthesia: General endotracheal Indications: Laurie  [name] is a 69 year old individual whose had a previous anterior decompression arthrodesis at C5-C6 done years ago she then developed spondylitic disease with evidence of early cord compression at C3-4 and underwent decompression and arthroplasty at that level.  She now has developed significant spondylolisthesis with stenosis at the C4-C5 level.  She has been advised regarding the need for surgery.  Procedure: Patient was brought to the operating room and placed on table supine position.  After the smooth induction of general endotracheal anesthesia her neck was placed in 5 pounds of halter traction.  He has a significant cervical thoracic kyphosis which reduced only minimally under anesthesia.  Then the neck was prepped with alcohol DuraPrep and draped in a sterile fashion to expose the anterior borders around the area of C4-C5.  A transverse incision was created in the neck after infiltrating with 8 cc of lidocaine  with epinephrine  mixed 50-50 with half percent Marcaine .  The dissection was taken down through the platysma and the plane between the sternocleidomastoid and the strap muscles was dissected bluntly into the prevertebral space was reached.  First identifiable disc space was noted to be that of C4-C5 between the previous arthroplasty and the top of the plate at R4-R3.  The dissection was carried out laterally to strip the longus coli muscle off to either side midline and placed a self-retaining retractor which was held  in place with the [facility] arm.  Then I opened the anterior longitudinal ligament and C4-C5.  Combination of curettes and rongeur's was used to remove a substantial quantity of ventral osteophytosis.  The disc space was then entered using 2 mm bit in the high-speed drill to drill down substantial bony osteophytosis.  The space was evacuated of this Gromis material and in the end that reached the posterior longitudinal ligament which was taken up with a 2 mm Kerrison punch.  Dissection was taken out to the lateral recesses which were well decompressed we then a 5 mm small modulus spacer was filled with demineralized bone matrix and placed into the interspace.  This had 7 degrees of lordosis during this portion of the case I had Dr. Dorn Glade break scrub and placed some flexion padding under the patient's head so as to remove some of the hyperlordosis that had been instilled by the patient's current position.  This allowed better placement of the spacer.  A 20 mm plate was then felt to fit best over the ventral aspect of the vertebral bodies however this required removal of the previous plate from R4-R3 which was done with a 2-1/2 mm screwdriver.  The plate was secured with two 3-1/2 x 13 mm screws were variable-angle.  This was then C5N and C4-3 and 1/2 x 15 mm screws were placed.  Final radiograph was obtained identifying good position of the spacer and the ventral plate.  With this hemostasis in the soft tissues was obtained meticulously when verified the retractors were finally removed and 4-0 Vicryl was used in the platysma to close it and 4-0 Vicryl subcuticular skin was used to close it blood loss was estimated 75 cc.

## 2024-02-19 NOTE — H&P (Signed)
 Laurie [name] is an 69 y.o. female.   Chief Complaint: Neck shoulder and arm pain HPI: [name] is a 69 year old individual whose had significant cervical spondylitic disease in the past.  She previously undergone decompression and fusion at the level of C5-6 and a few years ago underwent arthroplasty at the C3-C4 level.  In the interim she has developed significant spondylosis with anterolisthesis and moderate central cord compression at the level of C4-C5.  She has had dysesthetic sensations into the neck and shoulders and a severe case of pruritus in the back of the neck.  This is only gotten worse with time and is not been able to be managed conservatively.  Her recent MRI demonstrated a progressive stenosis at C4-C5 and I advised surgical decompression of this region.  She is now admitted for that procedure.  Past Medical History:  Diagnosis Date   Allergy    Anxiety    Arthritis    RA   Asthma    Chronic back pain    COPD (chronic obstructive pulmonary disease) (HCC)    Coronary artery disease    Depression    Esophageal stricture    GERD (gastroesophageal reflux disease)    Heart murmur    Hematemesis 09/10/2018   Hyperlipidemia    Hypertension    IBS (irritable bowel syndrome)    Interstitial cystitis    Neuromuscular disorder (HCC)    fibromyalgia   PONV (postoperative nausea and vomiting)     Past Surgical History:  Procedure Laterality Date   ABDOMINAL HYSTERECTOMY     APPENDECTOMY     BIOPSY  09/11/2018   Procedure: BIOPSY;  Surgeon: Wilhelmenia Aloha Raddle., MD;  Location: [facility] ENDOSCOPY;  Service: Gastroenterology;;   CERVICAL FUSION     CERVICAL LAMINECTOMY     CORONARY STENT INTERVENTION N/A 10/07/2019   Procedure: CORONARY STENT INTERVENTION;  Surgeon: Verlin Lonni BIRCH, MD;  Location: [facility] INVASIVE CV LAB;  Service: Cardiovascular;  Laterality: N/A;   ESOPHAGOGASTRODUODENOSCOPY     ESOPHAGOGASTRODUODENOSCOPY (EGD) WITH PROPOFOL  N/A 09/11/2018    Procedure: ESOPHAGOGASTRODUODENOSCOPY (EGD) WITH PROPOFOL ;  Surgeon: Wilhelmenia Aloha Raddle., MD;  Location: [facility] ENDOSCOPY;  Service: Gastroenterology;  Laterality: N/A;   FINGER SURGERY     KYPHOPLASTY N/A 03/29/2021   Procedure: Lumbar Three KYPHOPLASTY;  Surgeon: Colon Shove, MD;  Location: [facility] OR;  Service: Neurosurgery;  Laterality: N/A;   LEFT HEART CATH AND CORONARY ANGIOGRAPHY N/A 10/07/2019   Procedure: LEFT HEART CATH AND CORONARY ANGIOGRAPHY;  Surgeon: Verlin Lonni BIRCH, MD;  Location: [facility] INVASIVE CV LAB;  Service: Cardiovascular;  Laterality: N/A;   LEFT HEART CATH AND CORONARY ANGIOGRAPHY N/A 06/20/2022   Procedure: LEFT HEART CATH AND CORONARY ANGIOGRAPHY;  Surgeon: Verlin Lonni BIRCH, MD;  Location: [facility] INVASIVE CV LAB;  Service: Cardiovascular;  Laterality: N/A;   LUMBAR FUSION     SAVORY DILATION N/A 09/11/2018   Procedure: SAVORY DILATION;  Surgeon: Wilhelmenia Aloha Raddle., MD;  Location: [facility] ENDOSCOPY;  Service: Gastroenterology;  Laterality: N/A;   TONSILLECTOMY      Family History  Problem Relation Age of Onset   Dementia Mother    Heart attack Father    Coronary artery disease Father    Cancer Brother        esophageal   Esophageal cancer Brother    Coronary artery disease Paternal Aunt    Stomach cancer Paternal Aunt    Coronary artery disease Paternal Grandmother    Aneurysm Brother  aortic   Rectal cancer Neg Hx    Colon cancer Neg Hx    Thyroid  disease Neg Hx    Social History:  reports that she has been smoking cigarettes. She has never used smokeless tobacco. She reports current drug use. Drug: Marijuana. She reports that she does not drink alcohol.  Allergies: Allergies[1]  No medications prior to admission.    No results found for this or any previous visit (from the past 48 hours). No results found.  Review of Systems  Constitutional:  Positive for activity change.  Respiratory:  Positive for shortness of breath and wheezing.    Musculoskeletal:  Positive for myalgias, neck pain and neck stiffness.  Neurological:        Severe pruritus posterior aspect of the neck and shoulders  All other systems reviewed and are negative.   There were no vitals taken for this visit. Physical Exam Constitutional:      Appearance: Normal appearance. She is normal weight.  HENT:     Head: Normocephalic and atraumatic.     Right Ear: Tympanic membrane, ear canal and external ear normal.     Left Ear: Tympanic membrane, ear canal and external ear normal.     Nose: Nose normal.     Mouth/Throat:     Mouth: Mucous membranes are moist.     Pharynx: Oropharynx is clear.  Eyes:     Extraocular Movements: Extraocular movements intact.     Conjunctiva/sclera: Conjunctivae normal.     Pupils: Pupils are equal, round, and reactive to light.  Cardiovascular:     Rate and Rhythm: Normal rate and regular rhythm.     Pulses: Normal pulses.     Heart sounds: Normal heart sounds.  Pulmonary:     Effort: Pulmonary effort is normal.     Breath sounds: Normal breath sounds.  Abdominal:     General: Abdomen is flat. Bowel sounds are normal.     Palpations: Abdomen is soft.  Musculoskeletal:        General: Normal range of motion.     Cervical back: Normal range of motion and neck supple.  Skin:    General: Skin is warm and dry.     Capillary Refill: Capillary refill takes less than 2 seconds.  Neurological:     General: No focal deficit present.     Mental Status: She is alert. Mental status is at baseline.  Psychiatric:        Mood and Affect: Mood normal.        Behavior: Behavior normal.        Thought Content: Thought content normal.        Judgment: Judgment normal.      Assessment/Plan Cervical spondylosis with stenosis C4-C5 history of decompression and fusion at C5-6 arthroplasty C3-4.  Plan: Anterior cervical decompression arthrodesis C 4-5.  Victory JINNY Gens, MD 02/19/2024, 8:01 AM       [1]   Allergies Allergen Reactions   Codeine Nausea And Vomiting   Lisinopril  Cough   Misc. Sulfonamide Containing Compounds    Sulfonamide Derivatives Nausea And Vomiting   Tetracyclines & Related Rash

## 2024-02-19 NOTE — Progress Notes (Signed)
 Patient ID: Laurie  D [name], female   DOB: 22-Nov-1955, 69 y.o.   MRN: [id number] Vital signs are stable Pain management under poor control secondary to patient on large doses of methadone .  At this point I will change her over to Dilaudid  and see if this works better for her.

## 2024-02-19 NOTE — Transfer of Care (Signed)
 Immediate Anesthesia Transfer of Care Note  Patient: Laurie [name]  Procedure(s) Performed: CERVICAL FOUR-FIVE ANTERIOR CERVICAL DECOMPRESSION/DISCECTOMY FUSION  Patient Location: PACU  Anesthesia Type:General  Level of Consciousness: awake, alert , and oriented  Airway & Oxygen  Therapy: Patient Spontanous Breathing and Patient connected to face mask oxygen   Post-op Assessment: Report given to RN and Post -op Vital signs reviewed and stable  Post vital signs: stable  Last Vitals:  Vitals Value Taken Time  BP    Temp    Pulse    Resp    SpO2      Last Pain:  Vitals:   02/19/24 0919  TempSrc:   PainSc: 7       Patients Stated Pain Goal: 3 (02/19/24 0919)  Complications: No notable events documented.

## 2024-02-19 NOTE — Anesthesia Postprocedure Evaluation (Signed)
"   Anesthesia Post Note  Patient: Laurie [name]  Procedure(s) Performed: CERVICAL FOUR-FIVE ANTERIOR CERVICAL DECOMPRESSION/DISCECTOMY FUSION     Patient location during evaluation: PACU Anesthesia Type: General Level of consciousness: awake and alert Pain management: pain level controlled Vital Signs Assessment: post-procedure vital signs reviewed and stable Respiratory status: spontaneous breathing, nonlabored ventilation, respiratory function stable and patient connected to nasal cannula oxygen  Cardiovascular status: blood pressure returned to baseline and stable Postop Assessment: no apparent nausea or vomiting Anesthetic complications: no   No notable events documented.  Last Vitals:  Vitals:   02/19/24 1345 02/19/24 1422  BP: (!) 148/84 (!) 179/76  Pulse: 84 72  Resp: 16 16  Temp: 37 C 36.6 C  SpO2: 93% 93%    Last Pain:  Vitals:   02/19/24 1422  TempSrc: Oral  PainSc:                  Lynwood [name]      "

## 2024-02-20 ENCOUNTER — Encounter (HOSPITAL_COMMUNITY): Payer: Self-pay | Admitting: Neurological Surgery

## 2024-02-20 DIAGNOSIS — M4712 Other spondylosis with myelopathy, cervical region: Secondary | ICD-10-CM | POA: Diagnosis not present

## 2024-02-20 MED ORDER — DIAZEPAM 5 MG PO TABS: 5.0000 mg | ORAL_TABLET | Freq: Four times a day (QID) | ORAL | 0 refills | Status: AC | PRN

## 2024-02-20 MED ORDER — OXYCODONE-ACETAMINOPHEN 10-325 MG PO TABS: 1.0000 | ORAL_TABLET | Freq: Four times a day (QID) | ORAL | 0 refills | Status: AC | PRN

## 2024-02-20 NOTE — Evaluation (Addendum)
 Occupational Therapy Evaluation Patient Details Name: Laurie [name] MRN: [id number] DOB: 1955-10-06 Today's Date: 02/20/2024   History of Present Illness   69 yo F adm 1/8 s/p ACDF C4-5 removal of plate at R4-3 PMH C5-6 C3-4 surg PMH anxiety, RA, Chronic back pain COPD CAD depression GERD, HLD HTN IBS fibromyalgia . pt self -reports Graves disease     Clinical Impressions Patient is s/p ACDF C4-5  surgery resulting in functional limitations due to the deficits listed below (see OT problem list). Pt reports x8 falls in 01/2024 and c/o knots on her head from hitting it with the falls. Pt also explained lack of sleep currently with diplopia ( side by side). Pt educated on fine motor due to numbness 1st 2nd and 3rd digit at the DIP.  Patient will benefit from skilled OT acutely to increase independence and safety with ADLS to allow discharge home with family .  *of note- pt reports 2 months losing 40 lbs with unknown reason    If plan is discharge home, recommend the following:   A little help with bathing/dressing/bathroom     Functional Status Assessment   Patient has had a recent decline in their functional status and demonstrates the ability to make significant improvements in function in a reasonable and predictable amount of time.     Equipment Recommendations   BSC/3in1     Recommendations for Other Services         Precautions/Restrictions   Precautions Precautions: Cervical Precaution/Restrictions Comments: handout provided Restrictions Weight Bearing Restrictions Per Provider Order: No     Mobility Bed Mobility Overal bed mobility: Modified Independent                  Transfers Overall transfer level: Modified independent Equipment used: Rolling walker (2 wheels)                      Balance                                           ADL either performed or assessed with clinical judgement   ADL Overall ADL's  : Needs assistance/impaired Eating/Feeding: Modified independent   Grooming: Wash/dry face   Upper Body Bathing: Modified independent   Lower Body Bathing: Modified independent   Upper Body Dressing : Modified independent   Lower Body Dressing: Modified independent   Toilet Transfer: Contact guard assist Toilet Transfer Details (indicate cue type and reason): requesting 3n1 due to low height     Tub/ Shower Transfer: Contact guard assist   Functional mobility during ADLs: Supervision/safety;Rolling walker (2 wheels)       Vision Ability to See in Adequate Light: 1 Impaired Patient Visual Report: Diplopia Vision Assessment?: Yes Eye Alignment: Within Functional Limits Ocular Range of Motion: Within Functional Limits Alignment/Gaze Preference: Within Defined Limits Tracking/Visual Pursuits: Decreased smoothness of horizontal tracking Diplopia Assessment: Objects split side to side (not constant) Additional Comments: pt reports that when she drops her cigarette she sees two on the ground. pt reports when she is in the car that she will look up and see two cars coming at her instead of one. pt with hopping movement to her eyes. PT self reports Graves disease which could be potential secondary diplopia source     Perception         Praxis  Pertinent Vitals/Pain Pain Assessment Pain Assessment: 0-10 Pain Score: 8  Pain Location: throat and neck Pain Descriptors / Indicators: Discomfort, Grimacing Pain Intervention(s): Monitored during session, Premedicated before session, Repositioned     Extremity/Trunk Assessment Upper Extremity Assessment Upper Extremity Assessment: Right hand dominant;RUE deficits/detail;LUE deficits/detail RUE Deficits / Details: numbness DIP 1st 2nd and 3rd digit, grip strength 4 out 5. pt reports dropping objects and not knowing she dropped it RUE Sensation: decreased light touch RUE Coordination: decreased fine motor LUE Deficits /  Details: numbness DIP 1st 2nd and 3rd digit, grip strength 4 out 5. pt reports dropping objects and not knowing she dropped it LUE Sensation: decreased light touch LUE Coordination: decreased fine motor   Lower Extremity Assessment Lower Extremity Assessment: Defer to PT evaluation   Cervical / Trunk Assessment Cervical / Trunk Assessment: Kyphotic;Neck Surgery   Communication Communication Communication: No apparent difficulties   Cognition Arousal: Alert Behavior During Therapy: Flat affect Cognition: No family/caregiver present to determine baseline, No apparent impairments             OT - Cognition Comments: pt report poor sleep and only sleeping 2 hours and having medication for sleep but not falling asleep                 Following commands: Intact       Cueing  General Comments      incision dry and open to air   Exercises Exercises: Other exercises (fine motor hand exercises. handout provided for post concussion due to pt complaining of knots on her head from falls)   Shoulder Instructions      Home Living Family/patient expects to be discharged to:: Private residence Living Arrangements: Spouse/significant other (frank) Available Help at Discharge: Available 24 hours/day;Family Type of Home: Apartment Home Access: Level entry     Home Layout: One level     Bathroom Shower/Tub: Chief Strategy Officer: Standard Bathroom Accessibility: No   Home Equipment: Agricultural Consultant (2 wheels);Cane - single point   Additional Comments: has a dog cutie pie walks daily on side walk, drives, indep with adl prior      Prior Functioning/Environment Prior Level of Function : Driving             Mobility Comments: reports 8 falls in Dec 2025. pt report last fall 1 week ago. pt reports falling so hard that she cracked the back of the tub with her head      OT Problem List: Impaired balance (sitting and/or standing);Impaired  vision/perception   OT Treatment/Interventions: Self-care/ADL training;DME and/or AE instruction;Therapeutic activities;Patient/family education;Balance training      OT Goals(Current goals can be found in the care plan section)   Acute Rehab OT Goals Patient Stated Goal: to stop falling OT Goal Formulation: With patient Time For Goal Achievement: 03/05/24 Potential to Achieve Goals: Good   OT Frequency:  Min 2X/week    Co-evaluation              AM-PAC OT 6 Clicks Daily Activity     Outcome Measure Help from another person eating meals?: None Help from another person taking care of personal grooming?: None Help from another person toileting, which includes using toliet, bedpan, or urinal?: A Little Help from another person bathing (including washing, rinsing, drying)?: A Little Help from another person to put on and taking off regular upper body clothing?: None Help from another person to put on and taking off regular lower body clothing?: None  6 Click Score: 22   End of Session Equipment Utilized During Treatment: Rolling walker (2 wheels) Nurse Communication: Mobility status;Precautions  Activity Tolerance: Patient tolerated treatment well Patient left: in chair;with call bell/phone within reach  OT Visit Diagnosis: Unsteadiness on feet (R26.81)                Time: 0815-0900 OT Time Calculation (min): 45 min Charges:  OT General Charges $OT Visit: 1 Visit OT Evaluation $OT Eval Moderate Complexity: 1 Mod OT Treatments $Self Care/Home Management : 8-22 mins   Brynn, OTR/L  Acute Rehabilitation Services Office: [phone] .   Ely Molt 02/20/2024, 9:16 AM

## 2024-02-20 NOTE — Progress Notes (Signed)
 Patient alert and oriented, voided, ambulated. D/c instructions explain and given to the patient, all questions answered. Surgical site clean and dry no sign of infection. Patient d/c home per order.

## 2024-02-20 NOTE — Evaluation (Signed)
 Physical Therapy Evaluation  Patient Details Name: Laurie [name] MRN: [id number] DOB: 09/20/1955 Today's Date: 02/20/2024  History of Present Illness  Pt is a 69 y/o female who presents s/p ACDF C4-5 removal of plate at R4-3 on 02/19/2024. PMH significant for  anxiety, RA, Chronic back pain, COPD, CAD, depression, HTN, IBS, fibromyalgia.  Clinical Impression  Pt admitted with above diagnosis. At the time of PT eval, pt was able to demonstrate transfers and ambulation with gross CGA to supervision for safety and RW for support. Pt inquiring about a rollator, however feel the RW is a better option at this time due to frequent falls at home. Pt was educated on precautions, brace application/wearing schedule, appropriate activity progression, and car transfer. Pt currently with functional limitations due to the deficits listed below (see PT Problem List). Pt will benefit from skilled PT to increase their independence and safety with mobility to allow discharge to the venue listed below.          If plan is discharge home, recommend the following: A little help with walking and/or transfers;A little help with bathing/dressing/bathroom;Assistance with cooking/housework;Help with stairs or ramp for entrance;Assist for transportation   Can travel by private vehicle        Equipment Recommendations None recommended by PT  Recommendations for Other Services       Functional Status Assessment Patient has had a recent decline in their functional status and demonstrates the ability to make significant improvements in function in a reasonable and predictable amount of time.     Precautions / Restrictions Precautions Precautions: Cervical Precaution Booklet Issued: Yes (comment) Recall of Precautions/Restrictions: Intact Precaution/Restrictions Comments: Reviewed handout and pt was cued for precautions during functional mobility. Required Braces or Orthoses: Cervical Brace Cervical Brace: Soft  collar Restrictions Weight Bearing Restrictions Per Provider Order: No      Mobility  Bed Mobility Overal bed mobility: Needs Assistance Bed Mobility: Rolling, Sidelying to Sit, Sit to Sidelying Rolling: Supervision Sidelying to sit: Supervision     Sit to sidelying: Supervision General bed mobility comments: Can manage without assist but poor adherence to precautions and requires cues for log roll technique. Pt reports she will be sleeping in the recliner upon return home.    Transfers Overall transfer level: Needs assistance Equipment used: Rolling walker (2 wheels) Transfers: Sit to/from Stand Sit to Stand: Supervision           General transfer comment: VC's for hand placement on seated surface for safety. No assist required. Very flexed throughout.    Ambulation/Gait Ambulation/Gait assistance: Contact guard assist Gait Distance (Feet): 200 Feet Assistive device: Rolling walker (2 wheels) Gait Pattern/deviations: Step-through pattern, Decreased stride length, Trunk flexed Gait velocity: Decreased Gait velocity interpretation: <1.31 ft/sec, indicative of household ambulator   General Gait Details: VC's for improved posture, closer walker proximity and forward gaze. Pt limited with ability to make corrective changes due to extreme kyphosis however did try. No overt LOB noted throughout gait training.  Stairs            Wheelchair Mobility     Tilt Bed    Modified Rankin (Stroke Patients Only)       Balance Overall balance assessment: Needs assistance, History of Falls Sitting-balance support: Feet supported, No upper extremity supported Sitting balance-Leahy Scale: Fair     Standing balance support: Bilateral upper extremity supported, During functional activity, Reliant on assistive device for balance Standing balance-Leahy Scale: Poor  Pertinent Vitals/Pain Pain Assessment Pain Assessment:  Faces Faces Pain Scale: Hurts even more Pain Location: neck Pain Descriptors / Indicators: Discomfort, Grimacing, Operative site guarding, Sore Pain Intervention(s): Limited activity within patient's tolerance, Monitored during session, Repositioned    Home Living Family/patient expects to be discharged to:: Private residence Living Arrangements: Spouse/significant other (Laurie [name]) Available Help at Discharge: Available 24 hours/day;Family Type of Home: Apartment Home Access: Level entry       Home Layout: One level Home Equipment: Agricultural [employer]ant (2 wheels);Cane - single point Additional Comments: has a dog cutie pie walks daily on side walk, drives, indep with adl prior    Prior Function Prior Level of Function : Driving             Mobility Comments: reports 8 falls in Dec 2025. pt report last fall 1 week ago. pt reports falling so hard that she cracked the back of the tub with her head       Extremity/Trunk Assessment   Upper Extremity Assessment Upper Extremity Assessment: Defer to OT evaluation    Lower Extremity Assessment Lower Extremity Assessment: Generalized weakness    Cervical / Trunk Assessment Cervical / Trunk Assessment: Kyphotic;Neck Surgery (Extreme forward head posture, rounded shoulders and trunk flexion due to kyphosis.)  Communication   Communication Communication: No apparent difficulties    Cognition Arousal: Alert Behavior During Therapy: Flat affect   PT - Cognitive impairments: No apparent impairments                         Following commands: Intact       Cueing Cueing Techniques: Verbal cues, Gestural cues     General Comments General comments (skin integrity, edema, etc.): Upon entry pt scratching at and picking scabs off of prior shingles blisters. Provided foam dressing to cover open areas, notified RN and had pt wash hands due to blood on hands.    Exercises     Assessment/Plan    PT Assessment Patient needs  continued PT services  PT Problem List Decreased strength;Decreased range of motion;Decreased activity tolerance;Decreased balance;Decreased mobility;Decreased knowledge of use of DME;Decreased safety awareness;Decreased knowledge of precautions;Pain       PT Treatment Interventions DME instruction;Gait training;Stair training;Functional mobility training;Therapeutic activities;Therapeutic exercise;Balance training;Patient/family education    PT Goals (Current goals can be found in the Care Plan section)  Acute Rehab PT Goals Patient Stated Goal: Decrease pain, be able to improve posture PT Goal Formulation: With patient Time For Goal Achievement: 02/27/24 Potential to Achieve Goals: Good    Frequency Min 5X/week     Co-evaluation               AM-PAC PT 6 Clicks Mobility  Outcome Measure Help needed turning from your back to your side while in a flat bed without using bedrails?: A Little Help needed moving from lying on your back to sitting on the side of a flat bed without using bedrails?: A Little Help needed moving to and from a bed to a chair (including a wheelchair)?: A Little Help needed standing up from a chair using your arms (e.g., wheelchair or bedside chair)?: A Little Help needed to walk in hospital room?: A Little Help needed climbing 3-5 steps with a railing? : A Little 6 Click Score: 18    End of Session Equipment Utilized During Treatment: Gait belt;Cervical collar Activity Tolerance: Patient tolerated treatment well Patient left: in bed;with call bell/phone within reach Nurse Communication: Mobility status PT  Visit Diagnosis: Unsteadiness on feet (R26.81);Pain Pain - part of body:  (neck)    Time: 8945-8887 PT Time Calculation (min) (ACUTE ONLY): 18 min   Charges:   PT Evaluation $PT Eval Low Complexity: 1 Low   PT General Charges $$ ACUTE PT VISIT: 1 Visit         Leita Sable, PT, DPT Acute Rehabilitation Services Secure Chat  Preferred Office: [phone]   Leita JONETTA Sable 02/20/2024, 1:14 PM

## 2024-02-20 NOTE — Discharge Summary (Signed)
 Physician Discharge Summary  Patient ID: Laurie [name] MRN: [id number] DOB/AGE: 1955/11/22 69 y.o.  Admit date: 02/19/2024 Discharge date: 02/20/2024  Admission Diagnoses: Cervical spondylosis with myelopathy and radiculopathy C4-5 history of fusion C3-4 C5-6  Discharge Diagnoses: Spondylosis and myelopathy with radiculopathy C4-5.  History of fusion C5-6, history of arthroplasty C3-4 Principal Problem:   Cervical spondylosis with myelopathy   Discharged Condition: good  Hospital Course: Patient tolerated surgery well.  She has high opioid demands.  She has been on methadone .  Consults: None  Significant Diagnostic Studies: labs: None  Treatments: surgery: See op note  Discharge Exam: Blood pressure (!) 179/70, pulse 65, temperature 98.2 F (36.8 C), temperature source Oral, resp. rate 18, height 5' 10 (1.778 m), weight 64.9 kg, SpO2 99%. Incision clean and dry Station and gait are intact  Disposition: Discharge disposition: 01-Home or Self Care       Discharge Instructions     Call MD for:  redness, tenderness, or signs of infection (pain, swelling, redness, odor or green/yellow discharge around incision site)   Complete by: As directed    Call MD for:  severe uncontrolled pain   Complete by: As directed    Call MD for:  temperature >100.4   Complete by: As directed    Discharge wound care:   Complete by: As directed    Okay to shower. Do not apply salves or appointments to incision. No heavy lifting with the upper extremities greater than 10 pounds. May resume driving when not requiring pain medication and patient feels comfortable with doing so.   Incentive spirometry RT   Complete by: As directed    Increase activity slowly   Complete by: As directed       Allergies as of 02/20/2024       Reactions   Codeine Nausea And Vomiting   Lisinopril  Cough   Misc. Sulfonamide Containing Compounds    Sulfonamide Derivatives Nausea And Vomiting   Tetracyclines &  Related Rash        Medication List     TAKE these medications    albuterol  108 (90 Base) MCG/ACT inhaler Commonly known as: VENTOLIN  HFA INHALE TWO (2) PUFFS BY MOUTH EVERY 6 HOURS AS NEEDED FOR SHORTNESS OF BREATH AND FOR WHEEZING   aspirin  EC 81 MG tablet Take 1 tablet (81 mg total) by mouth daily. Swallow whole.   atorvastatin  40 MG tablet Commonly known as: LIPITOR TAKE 1 TABLET BY MOUTH EACH EVENING   cephALEXin  500 MG capsule Commonly known as: KEFLEX  Take 1 capsule (500 mg total) by mouth 2 (two) times daily.   clopidogrel  75 MG tablet Commonly known as: PLAVIX  TAKE 1 TABLET (75 MG TOTAL) BY MOUTH DAILY.   diazepam  5 MG tablet Commonly known as: Valium  Take 1 tablet (5 mg total) by mouth every 6 (six) hours as needed for muscle spasms.   fenofibrate  145 MG tablet Commonly known as: TRICOR  TAKE 1 TABLET BY MOUTH EACH EVENING   fluticasone  50 MCG/ACT nasal spray Commonly known as: FLONASE  INSTILL TWO (2) SPRAYS IN EACH NOSTRIL DAILY   furosemide  20 MG tablet Commonly known as: LASIX  TAKE ONE TABLET BY MOUTH ONCE Daily What changed:  how much to take how to take this when to take this reasons to take this additional instructions   HYDROcodone -acetaminophen  5-325 MG tablet Commonly known as: NORCO/VICODIN Take 1 tablet by mouth every 6 (six) hours as needed.   meclizine  25 MG tablet Commonly known as: ANTIVERT  Take 1 tablet (  25 mg total) by mouth 3 (three) times daily as needed for dizziness.   methadone  10 MG/ML solution Commonly known as: DOLOPHINE  Take 115 mg by mouth daily.   methimazole  10 MG tablet Commonly known as: TAPAZOLE  Take 1 tablet (10 mg total) by mouth 2 (two) times daily. TAKE 1 TABLET BY MOUTH EACH EVENING   metoprolol  succinate 50 MG 24 hr tablet Commonly known as: TOPROL -XL TAKE 1 TABLET BY MOUTH EVERY MORNING WITH OR IMMEDIATELY FOLLOWING A MEAL   naproxen  500 MG tablet Commonly known as: Naprosyn  Take 1 tablet (500 mg  total) by mouth 2 (two) times daily with a meal. What changed:  when to take this reasons to take this   nitroGLYCERIN  0.4 MG SL tablet Commonly known as: NITROSTAT  Place 1 tablet (0.4 mg total) under the tongue every 5 (five) minutes as needed for chest pain.   olmesartan  40 MG tablet Commonly known as: BENICAR  Take 1 tablet (40 mg total) by mouth daily.   oxyCODONE -acetaminophen  10-325 MG tablet Commonly known as: Percocet Take 1 tablet by mouth every 6 (six) hours as needed for pain.   pantoprazole  40 MG tablet Commonly known as: PROTONIX  TAKE 1 TABLET BY MOUTH EVERY MORNING   spironolactone  25 MG tablet Commonly known as: ALDACTONE  Take 1 tablet (25 mg total) by mouth daily.   venlafaxine  XR 150 MG 24 hr capsule Commonly known as: EFFEXOR -XR TAKE 1 CAPSULE BY MOUTH EVERY MORNING   venlafaxine  XR 75 MG 24 hr capsule Commonly known as: EFFEXOR -XR TAKE 1 CAPSULE BY MOUTH DAILY WITH 150MG  DOSE   Vitamin D  (Ergocalciferol ) 1.25 MG (50000 UNIT) Caps capsule Commonly known as: DRISDOL  Take 50,000 Units by mouth every 7 (seven) days.               Discharge Care Instructions  (From admission, onward)           Start     Ordered   02/20/24 0000  Discharge wound care:       Comments: Okay to shower. Do not apply salves or appointments to incision. No heavy lifting with the upper extremities greater than 10 pounds. May resume driving when not requiring pain medication and patient feels comfortable with doing so.   02/20/24 1531             Signed: Victory PARAS Jovanie Verge 02/20/2024, 3:31 PM

## 2024-02-20 NOTE — Care Management Obs Status (Signed)
 MEDICARE OBSERVATION STATUS NOTIFICATION   Patient Details  Name: Laurie [name] MRN: [id number] Date of Birth: 07/17/1955   Medicare Observation Status Notification Given:  Yes    [name] 02/20/2024, 10:13 AM

## 2024-03-03 ENCOUNTER — Other Ambulatory Visit: Payer: Self-pay | Admitting: Cardiovascular Disease

## 2024-03-09 NOTE — Telephone Encounter (Signed)
 Labs in [place] on 02/16/24 are outside of Normal Range
